# Patient Record
Sex: Male | Born: 1955 | Race: Black or African American | Hispanic: No | Marital: Married | Smoking: Former smoker
Health system: Southern US, Community
[De-identification: ages and names within clinical notes are randomized; demographics above are authoritative.]

## PROBLEM LIST (undated history)

## (undated) DIAGNOSIS — R0600 Dyspnea, unspecified: Secondary | ICD-10-CM

## (undated) DIAGNOSIS — I2510 Atherosclerotic heart disease of native coronary artery without angina pectoris: Secondary | ICD-10-CM

## (undated) DIAGNOSIS — I10 Essential (primary) hypertension: Secondary | ICD-10-CM

## (undated) DIAGNOSIS — E119 Type 2 diabetes mellitus without complications: Secondary | ICD-10-CM

## (undated) DIAGNOSIS — I639 Cerebral infarction, unspecified: Secondary | ICD-10-CM

## (undated) HISTORY — PX: FRACTURE SURGERY: SHX138

## (undated) HISTORY — PX: TIBIA FRACTURE SURGERY: SHX806

---

## 1996-12-04 HISTORY — PX: CORONARY ANGIOPLASTY WITH STENT PLACEMENT: SHX49

## 2000-07-04 ENCOUNTER — Encounter: Payer: Self-pay | Admitting: Emergency Medicine

## 2000-07-04 ENCOUNTER — Inpatient Hospital Stay (HOSPITAL_COMMUNITY): Admission: EM | Admit: 2000-07-04 | Discharge: 2000-07-08 | Payer: Self-pay | Admitting: Cardiology

## 2001-12-27 ENCOUNTER — Emergency Department (HOSPITAL_COMMUNITY): Admission: EM | Admit: 2001-12-27 | Discharge: 2001-12-27 | Payer: Self-pay | Admitting: Emergency Medicine

## 2006-01-19 ENCOUNTER — Ambulatory Visit: Payer: Self-pay | Admitting: Internal Medicine

## 2007-03-11 ENCOUNTER — Ambulatory Visit: Payer: Self-pay | Admitting: Internal Medicine

## 2007-07-15 ENCOUNTER — Ambulatory Visit: Payer: Self-pay | Admitting: Internal Medicine

## 2007-07-15 LAB — CONVERTED CEMR LAB
BUN: 20 mg/dL (ref 6–23)
CO2: 25 meq/L (ref 19–32)
Calcium: 8.5 mg/dL (ref 8.4–10.5)
Chloride: 105 meq/L (ref 96–112)
Creatinine, Ser: 1.3 mg/dL (ref 0.4–1.5)
GFR calc Af Amer: 75 mL/min
GFR calc non Af Amer: 62 mL/min
Glucose, Bld: 132 mg/dL — ABNORMAL HIGH (ref 70–99)
Potassium: 3.9 meq/L (ref 3.5–5.1)
Pro B Natriuretic peptide (BNP): 15 pg/mL (ref 0.0–100.0)
Sodium: 136 meq/L (ref 135–145)

## 2008-04-30 ENCOUNTER — Ambulatory Visit: Payer: Self-pay | Admitting: Internal Medicine

## 2008-06-22 ENCOUNTER — Ambulatory Visit: Payer: Self-pay | Admitting: Internal Medicine

## 2008-06-22 LAB — CONVERTED CEMR LAB
ALT: 20 units/L (ref 0–53)
AST: 19 units/L (ref 0–37)
Albumin: 3.4 g/dL — ABNORMAL LOW (ref 3.5–5.2)
Alkaline Phosphatase: 51 units/L (ref 39–117)
Bilirubin, Direct: 0.1 mg/dL (ref 0.0–0.3)
Cholesterol: 205 mg/dL (ref 0–200)
Direct LDL: 135.3 mg/dL
HDL: 37.9 mg/dL — ABNORMAL LOW (ref >39.0)
Total Bilirubin: 0.9 mg/dL (ref 0.3–1.2)
Total CHOL/HDL Ratio: 5.4
Total Protein: 7.1 g/dL (ref 6.0–8.3)
Triglycerides: 199 mg/dL — ABNORMAL HIGH (ref 0–149)
VLDL: 40 mg/dL (ref 0–40)

## 2009-04-20 DIAGNOSIS — Z9189 Other specified personal risk factors, not elsewhere classified: Secondary | ICD-10-CM | POA: Insufficient documentation

## 2009-04-20 DIAGNOSIS — I447 Left bundle-branch block, unspecified: Secondary | ICD-10-CM

## 2009-04-20 DIAGNOSIS — I509 Heart failure, unspecified: Secondary | ICD-10-CM | POA: Insufficient documentation

## 2009-04-20 DIAGNOSIS — E1159 Type 2 diabetes mellitus with other circulatory complications: Secondary | ICD-10-CM | POA: Insufficient documentation

## 2009-04-20 DIAGNOSIS — I255 Ischemic cardiomyopathy: Secondary | ICD-10-CM | POA: Insufficient documentation

## 2009-04-20 DIAGNOSIS — Z87891 Personal history of nicotine dependence: Secondary | ICD-10-CM | POA: Insufficient documentation

## 2009-04-20 DIAGNOSIS — I10 Essential (primary) hypertension: Secondary | ICD-10-CM | POA: Insufficient documentation

## 2009-04-20 DIAGNOSIS — I5022 Chronic systolic (congestive) heart failure: Secondary | ICD-10-CM | POA: Insufficient documentation

## 2009-04-22 ENCOUNTER — Ambulatory Visit: Payer: Self-pay | Admitting: Internal Medicine

## 2010-04-28 ENCOUNTER — Ambulatory Visit: Payer: Self-pay | Admitting: Internal Medicine

## 2010-05-13 ENCOUNTER — Telehealth: Payer: Self-pay | Admitting: Internal Medicine

## 2011-01-03 NOTE — Progress Notes (Signed)
Summary: refill  Phone Note Refill Request Message from:  Patient on May 13, 2010 10:19 AM  Refills Requested: Medication #1:  METOPROLOL TARTRATE 50 MG TABS Take1 and 1/2  tablet by mouth twice a day/out  Medication #2:  ENALAPRIL MALEATE 5 MG TABS Take one tablet by mouth twice a day  Medication #3:  SIMVASTATIN 20 MG TABS Take 1 tablet by mouth at bedtime/out. Erick Alley DR   Initial call taken by: Judie Grieve,  May 13, 2010 10:20 AM    Prescriptions: SIMVASTATIN 20 MG TABS (SIMVASTATIN) Take 1 tablet by mouth at bedtime/out  #30 x 5   Entered by:   Laurance Flatten CMA   Authorized by:   Laren Boom, MD, [facility]   Signed by:   Laurance Flatten CMA on 05/13/2010   Method used:   Electronically to        [facility] (retail)       [address]       [place], [place]  [address]       Ph: [phone]       Fax: [phone]   RxID:   [id number] ENALAPRIL MALEATE 5 MG TABS (ENALAPRIL MALEATE) Take one tablet by mouth twice a day  #60 x 5   Entered by:   Laurance Flatten CMA   Authorized by:   Laren Boom, MD, [facility]   Signed by:   Laurance Flatten CMA on 05/13/2010   Method used:   Electronically to        [facility] (retail)       [address]       [place], [place]  [address]       Ph: [phone]       Fax: [phone]   RxID:   [id number] METOPROLOL TARTRATE 50 MG TABS (METOPROLOL TARTRATE) Take1 and 1/2  tablet by mouth twice a day/out  #90 x 5   Entered by:   Laurance Flatten CMA   Authorized by:   Laren Boom, MD, [facility]   Signed by:   Laurance Flatten CMA on 05/13/2010   Method used:   Electronically to        [facility] (retail)       [address]       [place], [place]  [address]       Ph: [phone]       Fax: [phone]   RxID:   [id number]

## 2011-01-03 NOTE — Assessment & Plan Note (Signed)
Summary: per check out/sf   Visit Type:  Follow-up   History of Present Illness: Mr. Haidar returns today for followup.  He is a 55 yo man with a h/o DCM, class 2 CHF, and HTN.  He has a h/o but denies current Cocaine abuse.  No other symptoms today.  He is almost out of his meds.  Current Medications (verified): 1)  Aspirin Ec 325 Mg Tbec (Aspirin) .... Take One Tablet By Mouth Daily 2)  Metoprolol Tartrate 50 Mg Tabs (Metoprolol Tartrate) .... Take1 and 1/2  Tablet By Mouth Twice A Day/out 3)  Enalapril Maleate 5 Mg Tabs (Enalapril Maleate) .... Take One Tablet By Mouth Twice A Day 4)  Nitroglycerin 0.4 Mg/hr Pt24 (Nitroglycerin) .... Apply 1 Patch Each Morning and Remove At Bedtime 5)  Simvastatin 20 Mg Tabs (Simvastatin) .... Take 1 Tablet By Mouth At Bedtime/out  Allergies (verified): No Known Drug Allergies  Past History:  Past Medical History: Last updated: 04/20/2009 TOBACCO ABUSE, HX OF (ICD-V15.82) COCAINE ABUSE, HX OF (ICD-V15.9) LEFT BUNDLE BRANCH BLOCK (ICD-426.3) HYPERTENSION (ICD-401.9) CHF (ICD-428.0) CARDIOMYOPATHY, ISCHEMIC (ICD-414.8)  Review of Systems  The patient denies chest pain, syncope, dyspnea on exertion, and peripheral edema.    Vital Signs:  Patient profile:   55 year old male Height:      74 inches Weight:      253 pounds BMI:     32.60 Pulse rate:   82 / minute BP sitting:   106 / 70  (left arm)  Vitals Entered By: Laurance Flatten CMA (Apr 28, 2010 11:26 AM)  Physical Exam  General:  Well developed, well nourished, in no acute distress. Head:  normocephalic and atraumatic Eyes:  PERRLA/EOM intact; conjunctiva and lids normal. Mouth:  Teeth, gums and palate normal. Oral mucosa normal. Neck:  Neck supple, no JVD. No masses, thyromegaly or abnormal cervical nodes. Lungs:  Clear bilaterally to auscultation with no wheezes, rales, or rhonchi. Heart:  RRR with normal S1 and S2.  PMI is enlarged and laterally displaced. Abdomen:  Bowel sounds  positive; abdomen soft and non-tender without masses, organomegaly, or hernias noted. No hepatosplenomegaly. Msk:  Back normal, normal gait. Muscle strength and tone normal. Pulses:  pulses normal in all 4 extremities Extremities:  No clubbing or cyanosis. Neurologic:  Alert and oriented x 3.   Impression & Recommendations:  Problem # 1:  CHF (ICD-428.0) His chronic systolic CHF is well controlled class 2.  He appears to be taking his meds and following his diet.  He has not been hospitalized since I saw him last.   His updated medication list for this problem includes:    Aspirin Ec 325 Mg Tbec (Aspirin) .Marland Kitchen... Take one tablet by mouth daily    Metoprolol Tartrate 50 Mg Tabs (Metoprolol tartrate) .Marland Kitchen... Take1 and 1/2  tablet by mouth twice a day/out    Enalapril Maleate 5 Mg Tabs (Enalapril maleate) .Marland Kitchen... Take one tablet by mouth twice a day    Nitroglycerin 0.4 Mg/hr Pt24 (Nitroglycerin) .Marland Kitchen... Apply 1 patch each morning and remove at bedtime  Problem # 2:  HYPERTENSION (ICD-401.9) He will continue a low sodium diet. His updated medication list for this problem includes:    Aspirin Ec 325 Mg Tbec (Aspirin) .Marland Kitchen... Take one tablet by mouth daily    Metoprolol Tartrate 50 Mg Tabs (Metoprolol tartrate) .Marland Kitchen... Take1 and 1/2  tablet by mouth twice a day/out    Enalapril Maleate 5 Mg Tabs (Enalapril maleate) .Marland Kitchen... Take one tablet by  mouth twice a day  Problem # 3:  TOBACCO ABUSE, HX OF (ICD-V15.82) I have encouraged him to stop smoking.

## 2011-04-18 NOTE — Assessment & Plan Note (Signed)
[facility]                         ELECTROPHYSIOLOGY OFFICE NOTE   LAURA, RADILLA                          MRN:          [id number]  DATE:04/30/2008                            DOB:          08-28-1956    Karl [name] presents today for followup.  He is a very pleasant middle-  aged male with long-standing ischemic cardiomyopathy, class I-II  congestive heart failure.  He had a stent.  He returns today for  followup.   Patient has been stable.  He has had no syncope.  Denies chest pain or  shortness of breath.  He continues to work.   MEDICATIONS:  1. Aspirin 325 a day.  2. Metoprolol 75 twice daily.  3. Enalapril 5 twice daily.   PHYSICAL EXAMINATION:  He is a pleasant, well-appearing middle-aged man  in no distress.  Blood pressure 112/80, pulse 61 and regular, respirations 18.  Weight  was 252 pounds.  NECK:  No jugular venous distention.  LUNGS:  Clear bilaterally to auscultation.  No wheezes, rales or rhonchi  are present.  No increased work of breathing was present.  CARDIOVASCULAR:  Regular rate and rhythm with a normal S1 and S2.  There  are no murmurs.  There is a soft S3 gallop present.  The PMI was  enlarged and laterally displaced.  ABDOMEN:  Soft and nontender.  There is no organomegaly.  EXTREMITIES:  No clubbing, cyanosis or edema.  Pulses were 2+ and  symmetric.   EKG demonstrates sinus rhythm with left bundle branch block.   IMPRESSION:  1. Ischemic cardiomyopathy.  2. Congestive heart failure, class I-II.  3. Left bundle branch block.   DISCUSSION:  Overall, Karl [name] is stable.  I have again recommended  with proceeding with ICD insertion, and he again declines.  I have  explained to him the risks of not proceeding with defibrillator implant,  and he is willing to take these risks.  I will plan to see the patient  back in a year or sooner should he have worsening symptoms.     Doylene Canning. Ladona Ridgel, MD  Electronically  Signed    GWT/MedQ  DD: 04/30/2008  DT: 04/30/2008  Job #: [id number]

## 2011-04-21 NOTE — Cardiovascular Report (Signed)
[place]  Patient:    Karl [name]                           MRN: [id number] Proc. Date: 07/04/00 Adm. Date:  [id number] Attending:  Ronaldo Miyamoto CC:         Smitty Cords. Beverely Pace, M.D.  Arturo Morton. Riley Kill, M.D. [facility]  [facility]   Cardiac Catheterization  INDICATIONS:  The patient is a 55 year old with no prior history of coronary artery disease.  He developed chest pain at approximately 3:15 a.m. and subsequently came to the [facility] Emergency Room where EKG was compatible with an anterior wall MI.  Dr. Smitty Cords. Cheek contacted me, we contacted the cardiac catheterization laboratory and I came to see the patient.  The patient does give a history of having used cocaine about a month ago and his urine screen for cocaine was positive.  Importantly, he denies any recent use of cocaine, although he has been around with people smoking it in the last few days.  He denies any other exposures.  He has no prior history of major cardiac problems and has no allergies and takes no medicines at the current time.  He was given aspirin and heparin in the [facility] Emergency Room as well as nitroglycerin with slight relief of pain, but pain continued and he was brought to the catheterization laboratory emergently.  PROCEDURE 1. Left heart catheterization. 2. Selective coronary arteriography. 3. Selective left ventriculography. 4. Percutaneous transluminal coronary angioplasty and stenting of the left    anterior descending artery.  DESCRIPTION OF PROCEDURE:  The patient was brought to the cath lab and prepped and draped in usual fashion.  Xylocaine 1% was used for local anesthesia. Through an anterior puncture, the right femoral artery was easily entered and a 6-French sheath was placed.  Views of the left and right coronary arteries were obtained in multiple angiographic projections.  Ventriculography was performed in the RAO  projection.  Following a pressure pullback, the pigtail catheter was removed.  The patients ACT was satisfactory.  An Integrilin drip was started as a double bolus with a drip.  A JL4 guiding catheter was utilized to intubate the left main.  The lesion was crossed with a 0.014 Hi-Torque Floppy wire with a fairly sharp bend to gain access to the LAD from the left main.  The lesion was crossed.  There was evidence of extensive thrombus and the lesion was initially opened using a 2.5-mm Quantum Ranger balloon.  We then upgraded to a 3.5-mm Maverick, and then deployed a 3.5-mm 25-mm length NIR Elite stent.  Following this, there was a small area distally that was yet uncovered and this was covered with an 9-mm AVE 4.0 stent.  The previously placed stent was then post-dilated using a 4-mm balloon, with marked improvement in the appearance of the artery.  TIMI-3 flow was established.  EKG stabilized with less ventricular ectopy.  He had a small amount of reperfusion rhythm but overall, this improved.  All catheters were then subsequently removed and the femoral sheath sewn into place.  He was taken to the holding area in satisfactory clinical condition.  HEMODYNAMIC DATA:  The central aortic pressure was 133/91.  LV pressure 127/33.  There was no gradient on pullback across the aortic valve.  The left main coronary artery was long and free of critical disease.  The left anterior descending artery was totally  occluded in its midportion. Leading into this area of total occlusion, there was about 30% segmental plaquing.  There was mild calcification.  Just beyond the septal perforator, there was essentially total occlusion and then a trailing thrombus distal to the diagonal.  Following the PTCA and stenting, there was reestablishment of TIMI-3 flow in a widely patent artery.  The distal vessel had minimal luminal irregularity but wrapped the apex and was free of critical disease.  There was a  ramus intermedius that had what appeared to be about an 80-90% mid lesion and some thrombus more proximally with haziness and filling defect.  The circumflex proper provided two marginal branches and terminated as a posterolateral and posterior descending branch.  There was perhaps 20% narrowing of the proximal first marginal branch.  The remainder of the vessel was free of critical disease, although there were minor luminal irregularities.  The right coronary artery was a small non-dominant vessel without critical stenosis.  Ventriculography in the RAO projection reveals anteroapical and distal inferior akinesis.  There is an ejection fraction of 31%.  There is mitral regurgitation of a mild to at most moderate degree.  CONCLUSIONS 1. Large anterior wall infarction, treated with primary percutaneous    transluminal coronary angioplasty and stenting of the left anterior    descending artery. 2. Other findings as noted above. 3. Ejection fraction 31%.  DISPOSITION:  The patient will be treated with aspirin and Plavix.  He will be started on an ACE inhibitor.  Generally, we would start him on a beta blocker, but with recent cocaine and positive urine test, we will hold for about two to three days at least before starting this therapy.  I have discussed the situation with his sister and the patient gave me permission to discuss his entire situation with his sister from [place], who is his closest relative. Pictures were shown to her. DD:  07/04/00 TD:  07/05/00 Job: [id number]

## 2011-04-21 NOTE — Assessment & Plan Note (Signed)
[facility]                         ELECTROPHYSIOLOGY OFFICE NOTE   Karl [name], Karl [name]                          MRN:          [id number]  DATE:03/11/2007                            DOB:          03/16/1956    Karl [name] returns today for followup.  He is a very pleasant middle-aged  male with a history of ischemic cardiomyopathy, congestive heart failure  and severe LV dysfunction.  He does have dyslipidemia and hypertension.  The patient has been stable in the last year.  He has had no syncope.  Otherwise, he has been stable.   PHYSICAL EXAMINATION:  He is a pleasant, well-appearing middle-aged man  in no acute distress.  The blood pressure today was 128/78, the pulse 65 and regular and the  respirations were 18.  Weight was 256 pounds.  NECK:  No jugular venous distention.  LUNGS:  Clear bilaterally to auscultation with no wheeze, rales or  rhonchi.  CARDIOVASCULAR:  Regular rate and rhythm with a normal S1 and S2.  There  is a soft S4 gallop present.  The PMI was enlarged and laterally  displaced.  EXTREMITIES:  No cyanosis, clubbing or edema.   MEDICATIONS:  Aspirin, metoprolol and enalapril.   IMPRESSION:  1. Ischemic cardiomyopathy.  2. Congestive heart failure.   DISCUSSION:  Overall, Karl [name] is stable.  Again, I have recommended  ICD implantation, but again, he refuses, citing concerns about financial  issues, but ultimately he still refuses ICD.  He will continue his  present medical therapy and we will plan to see him back in a year.     Doylene Canning. Ladona Ridgel, MD  Electronically Signed    GWT/MedQ  DD: 03/11/2007  DT: 03/12/2007  Job #: [id number]

## 2011-04-21 NOTE — Discharge Summary (Signed)
[place]  Patient:    Karl [name], Karl [name]                          MRN: [id number] Adm. Date:  [id number] Disc. Date: 07/08/00 Attending:  Ronaldo Miyamoto Dictator:   Joellyn Rued, P.A.C. CC:         [facility]                           Discharge Summary  DATE OF BIRTH: Apr 18, 1956  HISTORY OF PRESENT ILLNESS: Karl [name] is a 55 year old black male who developed chest discomfort at 3:15 a.m.  His EKG was suggestive of anterolateral myocardial infarction associated with continuing chest discomfort.  It was also noted he uses cocaine, last use approximately one month ago; however, he has been exposed to cocaine smoking.  He has a history of tobacco use, family history of hypertension and diabetes.  LABORATORY DATA: Initial CK was elevated at 6903 with MB of 543.0, relative index 7.9.  Subsequent enzymes and troponins were declining.  Hemoglobin 14.2, hematocrit 41.6, normal indices; platelets 339,000; WBC 12.2.  Sodium 133, potassium 3.5, BUN 19, creatinine 1.2 on the day of discharge.  HOSPITAL COURSE: Karl [name] initially presented to [facility] and was emergently transferred to [facility].  He went to the catheterization laboratory with Dr. Riley Kill and catheterization, according to the progress notes, showed a 20% mid circumflex branch, 20% proximal LAD, 100% proximal LAD associated with clot, with 90% first diagonal, anterior apical akinesis with ejection fraction of 32%.  Angioplasty stenting was performed to the LAD by Dr. Riley Kill.  Over the next several days he was ambulating without difficulty and was seen by cardiac rehabilitation.  Further enzymes and EKGs showed evolving myocardial infarction.  By July 08, 2000 it was felt that he was ready to be discharged home.  DISCHARGE DIAGNOSES:  1. Acute anterior myocardial infarction, status post emergent angioplasty and     stenting to the left anterior  descending coronary artery.  2. Ischemic cardiomyopathy.  3. Cocaine and tobacco use.  DISPOSITION: He is discharged home.  DISCHARGE MEDICATIONS:  1. Enteric-coated aspirin 325 mg q.d.  2. Plavix 75 mg q.d. for four weeks.  3. Altace 2.5 mg b.i.d.  4. Lopressor 50 mg 1/2 tablet b.i.d.  5. Sublingual nitroglycerin as-needed.  DISCHARGE ACTIVITY: He was advised on no lifting, driving, sexual activity, or heavy exertion until seen by physician.  DISCHARGE DIET: Maintain low-fat/low-salt/low-cholesterol diet.  DISCHARGE INSTRUCTIONS: If he has any problems with the catheterization site he is asked to call immediately.  He was advised on no smoking of tobacco products or cocaine, or other drug usage, and advised less than two ounces of alcohol per day.  FOLLOW-UP: He was asked to call our office on Monday to arrange a follow-up appointment.  He will also need a fasting lipid check as an outpatient.DD: 07/08/00 TD:  07/08/00 Job: [id number]

## 2011-06-08 ENCOUNTER — Other Ambulatory Visit: Payer: Self-pay | Admitting: Internal Medicine

## 2011-08-04 ENCOUNTER — Telehealth: Payer: Self-pay | Admitting: Internal Medicine

## 2011-08-04 ENCOUNTER — Other Ambulatory Visit: Payer: Self-pay | Admitting: Internal Medicine

## 2011-08-04 MED ORDER — ENALAPRIL MALEATE 5 MG PO TABS: 5.0000 mg | ORAL_TABLET | Freq: Two times a day (BID) | ORAL | Status: DC

## 2011-08-04 NOTE — Telephone Encounter (Signed)
Pt needs a refill on enalapril 5mg  bid called into [facility] on [address] and he needs it today because he is completely out

## 2011-12-23 ENCOUNTER — Other Ambulatory Visit: Payer: Self-pay | Admitting: Internal Medicine

## 2011-12-25 ENCOUNTER — Other Ambulatory Visit: Payer: Self-pay

## 2011-12-25 ENCOUNTER — Other Ambulatory Visit: Payer: Self-pay | Admitting: Internal Medicine

## 2011-12-25 MED ORDER — ENALAPRIL MALEATE 5 MG PO TABS: 5.0000 mg | ORAL_TABLET | Freq: Two times a day (BID) | ORAL | Status: DC

## 2011-12-25 MED ORDER — METOPROLOL TARTRATE 50 MG PO TABS: 50.0000 mg | ORAL_TABLET | Freq: Two times a day (BID) | ORAL | Status: DC

## 2011-12-26 ENCOUNTER — Other Ambulatory Visit: Payer: Self-pay | Admitting: *Deleted

## 2011-12-26 MED ORDER — METOPROLOL TARTRATE 50 MG PO TABS: 75.0000 mg | ORAL_TABLET | Freq: Two times a day (BID) | ORAL | Status: DC

## 2012-04-12 ENCOUNTER — Other Ambulatory Visit: Payer: Self-pay | Admitting: Internal Medicine

## 2012-04-12 NOTE — Telephone Encounter (Signed)
Pt is out of pills 

## 2012-05-15 ENCOUNTER — Other Ambulatory Visit: Payer: Self-pay | Admitting: Internal Medicine

## 2012-05-15 ENCOUNTER — Telehealth: Payer: Self-pay | Admitting: Internal Medicine

## 2012-05-15 MED ORDER — METOPROLOL TARTRATE 50 MG PO TABS: 75.0000 mg | ORAL_TABLET | Freq: Two times a day (BID) | ORAL | Status: DC

## 2012-05-15 MED ORDER — ENALAPRIL MALEATE 5 MG PO TABS: 5.0000 mg | ORAL_TABLET | Freq: Two times a day (BID) | ORAL | Status: DC

## 2012-05-15 NOTE — Telephone Encounter (Signed)
New msg Pt's daughter wanted to talk to you about his meds.

## 2012-05-15 NOTE — Telephone Encounter (Signed)
F/u   Patient daughter call back for status update, she can be reached at [phone].

## 2012-05-15 NOTE — Telephone Encounter (Signed)
Called daughter back and left message for her.( Not sure it went through)

## 2012-05-15 NOTE — Telephone Encounter (Signed)
Patient daughter [name] returning patient call, she can be reached at [phone].

## 2012-05-15 NOTE — Telephone Encounter (Signed)
Close  

## 2012-05-16 ENCOUNTER — Telehealth: Payer: Self-pay | Admitting: Internal Medicine

## 2012-05-16 NOTE — Telephone Encounter (Signed)
Let daughter know we will have him scheduled for appointment next avaliable

## 2012-05-16 NOTE — Telephone Encounter (Signed)
[name] [phone] -patient daughter said she never recvd call from nurse yesterday.  Please return call to daughter [name]

## 2012-06-13 ENCOUNTER — Ambulatory Visit: Payer: Self-pay | Admitting: Internal Medicine

## 2012-06-27 ENCOUNTER — Encounter: Payer: Self-pay | Admitting: Internal Medicine

## 2012-07-23 ENCOUNTER — Ambulatory Visit: Payer: Self-pay | Admitting: Internal Medicine

## 2012-07-24 ENCOUNTER — Other Ambulatory Visit: Payer: Self-pay | Admitting: Cardiology

## 2012-07-24 MED ORDER — METOPROLOL TARTRATE 50 MG PO TABS: 75.0000 mg | ORAL_TABLET | Freq: Two times a day (BID) | ORAL | Status: DC

## 2012-07-29 ENCOUNTER — Encounter: Payer: Self-pay | Admitting: Internal Medicine

## 2012-07-29 ENCOUNTER — Telehealth: Payer: Self-pay | Admitting: Internal Medicine

## 2012-07-29 ENCOUNTER — Ambulatory Visit (INDEPENDENT_AMBULATORY_CARE_PROVIDER_SITE_OTHER): Payer: PRIVATE HEALTH INSURANCE | Admitting: Internal Medicine

## 2012-07-29 VITALS — BP 150/90 | HR 63 | Ht 74.0 in | Wt 256.1 lb

## 2012-07-29 DIAGNOSIS — I5022 Chronic systolic (congestive) heart failure: Secondary | ICD-10-CM

## 2012-07-29 DIAGNOSIS — I509 Heart failure, unspecified: Secondary | ICD-10-CM

## 2012-07-29 DIAGNOSIS — I2589 Other forms of chronic ischemic heart disease: Secondary | ICD-10-CM

## 2012-07-29 MED ORDER — CARVEDILOL 12.5 MG PO TABS: 12.5000 mg | ORAL_TABLET | Freq: Two times a day (BID) | ORAL | Status: DC

## 2012-07-29 NOTE — Telephone Encounter (Signed)
New Problem:    Patient's wife call in because the patient missed his appointment from this morning and wanted to be worked in later today. Please call back.

## 2012-07-29 NOTE — Patient Instructions (Addendum)
Your physician wants you to follow-up in: 1 year with Dr Court Joy will receive a reminder letter in the mail two months in advance. If you don't receive a letter, please call our office to schedule the follow-up appointment. Your physician has recommended you make the following change in your medication: STOP Metoprolol and START Carvedilol 12.5 mg twice daily      2 Gram Low Sodium Diet A 2 gram sodium diet restricts the amount of sodium in the diet to no more than 2 g or 2000 mg daily. Limiting the amount of sodium is often used to help lower blood pressure. It is important if you have heart, liver, or kidney problems. Many foods contain sodium for flavor and sometimes as a preservative. When the amount of sodium in a diet needs to be low, it is important to know what to look for when choosing foods and drinks. The following includes some information and guidelines to help make it easier for you to adapt to a low sodium diet. QUICK TIPS  Do not add salt to food.   Avoid convenience items and fast food.   Choose unsalted snack foods.   Buy lower sodium products, often labeled as "lower sodium" or "no salt added."   Check food labels to learn how much sodium is in 1 serving.   When eating at a restaurant, ask that your food be prepared with less salt or none, if possible.  READING FOOD LABELS FOR SODIUM INFORMATION The nutrition facts label is a good place to find how much sodium is in foods. Look for products with no more than 500 to 600 mg of sodium per meal and no more than 150 mg per serving. Remember that 2 g = 2000 mg. The food label may also list foods as:  Sodium-free: Less than 5 mg in a serving.   Very low sodium: 35 mg or less in a serving.   Low-sodium: 140 mg or less in a serving.   Light in sodium: 50% less sodium in a serving. For example, if a food that usually has 300 mg of sodium is changed to become light in sodium, it will have 150 mg of sodium.   Reduced  sodium: 25% less sodium in a serving. For example, if a food that usually has 400 mg of sodium is changed to reduced sodium, it will have 300 mg of sodium.  CHOOSING FOODS Grains  Avoid: Salted crackers and snack items. Some cereals, including instant hot cereals. Bread stuffing and biscuit mixes. Seasoned rice or pasta mixes.   Choose: Unsalted snack items. Low-sodium cereals, oats, puffed wheat and rice, shredded wheat. English muffins and bread. Pasta.  Meats  Avoid: Salted, canned, smoked, spiced, pickled meats, including fish and poultry. Bacon, ham, sausage, cold cuts, hot dogs, anchovies.   Choose: Low-sodium canned tuna and salmon. Fresh or frozen meat, poultry, and fish.  Dairy  Avoid: Processed cheese and spreads. Cottage cheese. Buttermilk and condensed milk. Regular cheese.   Choose: Milk. Low-sodium cottage cheese. Yogurt. Sour cream. Low-sodium cheese.  Fruits and Vegetables  Avoid: Regular canned vegetables. Regular canned tomato sauce and paste. Frozen vegetables in sauces. Olives. Rosita Fire. Relishes. Sauerkraut.   Choose: Low-sodium canned vegetables. Low-sodium tomato sauce and paste. Frozen or fresh vegetables. Fresh and frozen fruit.  Condiments  Avoid: Canned and packaged gravies. Worcestershire sauce. Tartar sauce. Barbecue sauce. Soy sauce. Steak sauce. Ketchup. Onion, garlic, and table salt. Meat flavorings and tenderizers.   Choose: Fresh and dried  herbs and spices. Low-sodium varieties of mustard and ketchup. Lemon juice. Tabasco sauce. Horseradish.  SAMPLE 2 GRAM SODIUM MEAL PLAN Breakfast / Sodium (mg)  1 cup low-fat milk / 143 mg   2 slices whole-wheat toast / 270 mg   1 tbs heart-healthy margarine / 153 mg   1 hard-boiled egg / 139 mg   1 small orange / 0 mg  Lunch / Sodium (mg)  1 cup raw carrots / 76 mg    cup hummus / 298 mg   1 cup low-fat milk / 143 mg    cup red grapes / 2 mg   1 whole-wheat pita bread / 356 mg  Dinner / Sodium  (mg)  1 cup whole-wheat pasta / 2 mg   1 cup low-sodium tomato sauce / 73 mg   3 oz lean ground beef / 57 mg   1 small side salad (1 cup raw spinach leaves,  cup cucumber,  cup yellow bell pepper) with 1 tsp olive oil and 1 tsp red wine vinegar / 25 mg  Snack / Sodium (mg)  1 container low-fat vanilla yogurt / 107 mg   3 graham cracker squares / 127 mg  Nutrient Analysis  Calories: 2033   Protein: 77 g   Carbohydrate: 282 g   Fat: 72 g   Sodium: 1971 mg  Document Released: 11/20/2005 Document Revised: 11/09/2011 Document Reviewed: 02/21/2010 [facility] Patient Information 2012 [place], [place].

## 2012-07-29 NOTE — Telephone Encounter (Signed)
Per Dr Ladona Ridgel Karl [name] can come in now and be seen. --- Karl [name] understands and is on his way.

## 2012-07-30 ENCOUNTER — Encounter: Payer: Self-pay | Admitting: Internal Medicine

## 2012-07-30 NOTE — Assessment & Plan Note (Signed)
He has no anginal symptoms. He will continue his current meds.

## 2012-07-30 NOTE — Assessment & Plan Note (Signed)
His chronic systolic heart failure is class 2. I have again discussed the importance of reducing his sodium intake, not missing his medications, and losing weight. With his metoprolol prescription empty, we will start carvedilol with uptitration.  He remains uninterested in undergoing ICD implantation.

## 2012-07-30 NOTE — Progress Notes (Signed)
HPI Karl [name] returns today for CHF followup. He is a pleasant 56 yo man with a h/o chronic systolic heart failure, HTN, and remote substance abuse. In the interim he notes that his blood pressure has been elevated and still has ongoing non-compliance. He admits to consuming sodium in excess.  Allergies  Allergen Reactions  . Lipitor (Atorvastatin)     N & V     Current Outpatient Prescriptions  Medication Sig Dispense Refill  . enalapril (VASOTEC) 5 MG tablet TAKE ONE TABLET BY MOUTH TWICE DAILY  60 tablet  2  . carvedilol (COREG) 12.5 MG tablet Take 1 tablet (12.5 mg total) by mouth 2 (two) times daily.  180 tablet  3     No past medical history on file.  ROS:   All systems reviewed and negative except as noted in the HPI.   No past surgical history on file.   No family history on file.   History   Social History  . Marital Status: Married    Spouse Name: N/A    Number of Children: N/A  . Years of Education: N/A   Occupational History  . Not on file.   Social History Main Topics  . Smoking status: Former Smoker -- 2.0 packs/day for 5 years    Types: Cigarettes    Quit date: 12/04/2000  . Smokeless tobacco: Not on file  . Alcohol Use: Not on file  . Drug Use: Not on file  . Sexually Active: Not on file   Other Topics Concern  . Not on file   Social History Narrative  . No narrative on file     BP 150/90  Pulse 63  Ht 6\' 2"  (1.88 m)  Wt 256 lb 1.9 oz (116.175 kg)  BMI 32.88 kg/m2  Physical Exam:  Well appearing middle aged man, NAD HEENT: Unremarkable Neck:  No JVD, no thyromegally Lungs:  Clear with no wheezes, rales, or rhonchi HEART:  Regular rate rhythm, no murmurs, no rubs, no clicks Abd:  soft, positive bowel sounds, no organomegally, no rebound, no guarding Ext:  2 plus pulses, no edema, no cyanosis, no clubbing Skin:  No rashes no nodules Neuro:  CN II through XII intact, motor grossly intact  EKG NSR with LBBB  Assess/Plan:

## 2012-09-18 ENCOUNTER — Other Ambulatory Visit: Payer: Self-pay | Admitting: Internal Medicine

## 2013-01-28 ENCOUNTER — Other Ambulatory Visit: Payer: Self-pay

## 2013-01-28 MED ORDER — ENALAPRIL MALEATE 5 MG PO TABS: 5.0000 mg | ORAL_TABLET | Freq: Two times a day (BID) | ORAL | Status: DC

## 2013-07-03 ENCOUNTER — Emergency Department (HOSPITAL_COMMUNITY): Payer: BC Managed Care – PPO

## 2013-07-03 ENCOUNTER — Emergency Department (HOSPITAL_COMMUNITY)
Admission: EM | Admit: 2013-07-03 | Discharge: 2013-07-03 | Disposition: A | Discharge status: Home / Self Care | Payer: BC Managed Care – PPO | Attending: Emergency Medicine | Admitting: Emergency Medicine

## 2013-07-03 ENCOUNTER — Encounter (HOSPITAL_COMMUNITY): Payer: Self-pay | Admitting: Emergency Medicine

## 2013-07-03 DIAGNOSIS — R5383 Other fatigue: Secondary | ICD-10-CM | POA: Insufficient documentation

## 2013-07-03 DIAGNOSIS — R5381 Other malaise: Secondary | ICD-10-CM | POA: Insufficient documentation

## 2013-07-03 DIAGNOSIS — R112 Nausea with vomiting, unspecified: Secondary | ICD-10-CM

## 2013-07-03 DIAGNOSIS — I2510 Atherosclerotic heart disease of native coronary artery without angina pectoris: Secondary | ICD-10-CM | POA: Insufficient documentation

## 2013-07-03 DIAGNOSIS — Z87891 Personal history of nicotine dependence: Secondary | ICD-10-CM | POA: Insufficient documentation

## 2013-07-03 DIAGNOSIS — Z7982 Long term (current) use of aspirin: Secondary | ICD-10-CM | POA: Insufficient documentation

## 2013-07-03 DIAGNOSIS — R197 Diarrhea, unspecified: Secondary | ICD-10-CM | POA: Insufficient documentation

## 2013-07-03 DIAGNOSIS — R011 Cardiac murmur, unspecified: Secondary | ICD-10-CM | POA: Insufficient documentation

## 2013-07-03 DIAGNOSIS — R109 Unspecified abdominal pain: Secondary | ICD-10-CM | POA: Insufficient documentation

## 2013-07-03 HISTORY — DX: Atherosclerotic heart disease of native coronary artery without angina pectoris: I25.10

## 2013-07-03 LAB — COMPREHENSIVE METABOLIC PANEL
AST: 18 U/L (ref 0–37)
CO2: 21 mEq/L (ref 19–32)
Calcium: 9.6 mg/dL (ref 8.4–10.5)
Creatinine, Ser: 0.98 mg/dL (ref 0.50–1.35)
GFR calc Af Amer: >90 mL/min (ref >90)
GFR calc non Af Amer: 89 mL/min — ABNORMAL LOW (ref >90)
Total Protein: 7.9 g/dL (ref 6.0–8.3)

## 2013-07-03 LAB — LIPASE, BLOOD: Lipase: 17 U/L (ref 11–59)

## 2013-07-03 LAB — URINALYSIS W MICROSCOPIC + REFLEX CULTURE
Leukocytes, UA: NEGATIVE
Nitrite: NEGATIVE
Specific Gravity, Urine: 1.027 (ref 1.005–1.030)
pH: 8 (ref 5.0–8.0)

## 2013-07-03 LAB — CBC
HCT: 45.5 % (ref 39.0–52.0)
Hemoglobin: 15.3 g/dL (ref 13.0–17.0)
MCH: 25.8 pg — ABNORMAL LOW (ref 26.0–34.0)
RBC: 5.92 MIL/uL — ABNORMAL HIGH (ref 4.22–5.81)

## 2013-07-03 MED ORDER — MORPHINE SULFATE 4 MG/ML IJ SOLN
4.0000 mg | Freq: Once | INTRAMUSCULAR | Status: AC
  Administered 2013-07-03: 4 mg via INTRAVENOUS
  Filled 2013-07-03: qty 1

## 2013-07-03 MED ORDER — ONDANSETRON 8 MG PO TBDP: 8.0000 mg | ORAL_TABLET | Freq: Three times a day (TID) | ORAL | Status: DC | PRN

## 2013-07-03 MED ORDER — ONDANSETRON HCL 4 MG/2ML IJ SOLN
4.0000 mg | Freq: Once | INTRAMUSCULAR | Status: AC
  Administered 2013-07-03: 4 mg via INTRAVENOUS
  Filled 2013-07-03: qty 2

## 2013-07-03 MED ORDER — SODIUM CHLORIDE 0.9 % IV BOLUS (SEPSIS)
1000.0000 mL | Freq: Once | INTRAVENOUS | Status: AC
  Administered 2013-07-03: 1000 mL via INTRAVENOUS

## 2013-07-03 NOTE — ED Notes (Signed)
C/o one day history of vomiting with diaphoresis.  Times 6 today.  Ate "nabs and sunchips" to settle stomach this am. States emesis x 1 since arrival at ED. Describes as clear with "some red in it". Intermittent shooting,burning pain throughout abdomen.

## 2013-07-03 NOTE — ED Notes (Signed)
Patient transported to X-ray 

## 2013-07-03 NOTE — ED Provider Notes (Signed)
CSN: [id number]     Arrival date & time 07/03/13  1707 History     First MD Initiated Contact with Patient 07/03/13 1755     Chief Complaint  Patient presents with  . Emesis  . Diarrhea   (Consider location/radiation/quality/duration/timing/severity/associated sxs/prior Treatment) HPI Karl [name] is a 57 y.o. male who presents to ED with complaint of nausea, vomiting. States woke up this morning feeling unwell and nauseated. Pt states ate some "nabs and chips" and went to work. Few hours late, states started having nausea, vomiting. States vomited about 10 times. States it is mostly food contents. States diffuse abdominal "burning." States "its not pain, its burning."  Pt states no diarrhea. No hx of abdominal problems, no hx of surgeries, denies daily alcohol, frequent NSAIDs use. No numbness or weakness in extremities. Did not try any medications. Denies chest pain or SOB  Past Medical History  Diagnosis Date  . Coronary artery disease    Past Surgical History  Procedure Laterality Date  . Coronary stent placement  19 years ago   History reviewed. No pertinent family history. History  Substance Use Topics  . Smoking status: Former Smoker -- 2.00 packs/day for 5 years    Types: Cigarettes    Quit date: 12/04/2000  . Smokeless tobacco: Not on file  . Alcohol Use: Yes     Comment: weekends    Review of Systems  Constitutional: Negative for fever and chills.  Respiratory: Negative.   Cardiovascular: Negative.   Gastrointestinal: Positive for nausea, vomiting and abdominal pain. Negative for diarrhea, constipation and blood in stool.  Genitourinary: Negative for dysuria and flank pain.  Neurological: Positive for weakness. Negative for headaches.  All other systems reviewed and are negative.    Allergies  Lipitor  Home Medications   Current Outpatient Rx  Name  Route  Sig  Dispense  Refill  . acetaminophen (TYLENOL) 500 MG tablet   Oral   Take 500 mg by mouth  every 6 (six) hours as needed for pain.         Marland Kitchen aspirin 325 MG tablet   Oral   Take 325 mg by mouth daily.         . carvedilol (COREG) 12.5 MG tablet   Oral   Take 1 tablet (12.5 mg total) by mouth 2 (two) times daily.   180 tablet   3   . enalapril (VASOTEC) 5 MG tablet      TAKE ONE TABLET BY MOUTH TWICE DAILY. NEEDS APPOINTMENT BEFORE ADDITIONAL REFILLS   60 tablet   2    BP 152/80  Pulse 71  Temp(Src) 98 F (36.7 C) (Oral)  Ht 6' 2.5" (1.892 m)  Wt 245 lb (111.131 kg)  BMI 31.05 kg/m2  SpO2 100% Physical Exam  Nursing note and vitals reviewed. Constitutional: He appears well-developed and well-nourished. No distress.  Eyes: Conjunctivae are normal.  Neck: Neck supple.  Cardiovascular: Normal rate and regular rhythm.   Murmur heard. Pulmonary/Chest: Effort normal and breath sounds normal. No respiratory distress. He has no wheezes. He has no rales.  Abdominal: Soft. Bowel sounds are normal. He exhibits no distension. There is no tenderness. There is no rebound.  Musculoskeletal: He exhibits no edema.  Neurological: He is alert.  Skin: Skin is warm and dry.    ED Course   Procedures (including critical care time)  Pt with nausea, vomiting. Abdominal burning. Labs ordered. Abdomen soft, doubt acute abdomen.   Results for orders placed during  the hospital encounter of 07/03/13  COMPREHENSIVE METABOLIC PANEL      Result Value Range   Sodium 135  135 - 145 mEq/L   Potassium 4.0  3.5 - 5.1 mEq/L   Chloride 101  96 - 112 mEq/L   CO2 21  19 - 32 mEq/L   Glucose, Bld 155 (*) 70 - 99 mg/dL   BUN 17  6 - 23 mg/dL   Creatinine, Ser 4.09  0.50 - 1.35 mg/dL   Calcium 9.6  8.4 - 81.1 mg/dL   Total Protein 7.9  6.0 - 8.3 g/dL   Albumin 3.8  3.5 - 5.2 g/dL   AST 18  0 - 37 U/L   ALT 14  0 - 53 U/L   Alkaline Phosphatase 63  39 - 117 U/L   Total Bilirubin 0.4  0.3 - 1.2 mg/dL   GFR calc non Af Amer 89 (*) >90 mL/min   GFR calc Af Amer >90  >90 mL/min  CBC       Result Value Range   WBC 8.0  4.0 - 10.5 K/uL   RBC 5.92 (*) 4.22 - 5.81 MIL/uL   Hemoglobin 15.3  13.0 - 17.0 g/dL   HCT 91.4  78.2 - 95.6 %   MCV 76.9 (*) 78.0 - 100.0 fL   MCH 25.8 (*) 26.0 - 34.0 pg   MCHC 33.6  30.0 - 36.0 g/dL   RDW 21.3  08.6 - 57.8 %   Platelets 284  150 - 400 K/uL  LIPASE, BLOOD      Result Value Range   Lipase 17  11 - 59 U/L  URINALYSIS W MICROSCOPIC + REFLEX CULTURE      Result Value Range   Color, Urine YELLOW  YELLOW   APPearance TURBID (*) CLEAR   Specific Gravity, Urine 1.027  1.005 - 1.030   pH 8.0  5.0 - 8.0   Glucose, UA NEGATIVE  NEGATIVE mg/dL   Hgb urine dipstick NEGATIVE  NEGATIVE   Bilirubin Urine NEGATIVE  NEGATIVE   Ketones, ur 15 (*) NEGATIVE mg/dL   Protein, ur 30 (*) NEGATIVE mg/dL   Urobilinogen, UA 1.0  0.0 - 1.0 mg/dL   Nitrite NEGATIVE  NEGATIVE   Leukocytes, UA NEGATIVE  NEGATIVE   Urine-Other AMORPHOUS URATES/PHOSPHATES     Dg Abd Acute W/chest  07/03/2013   *RADIOLOGY REPORT*  Clinical Data: Nausea, vomiting  ACUTE ABDOMEN SERIES (ABDOMEN 2 VIEW & CHEST 1 VIEW)  Comparison: None.  Findings: Borderline cardiac enlargement with vascular and interstitial prominence.  No definite CHF or pneumonia.  Negative for effusion or pneumothorax.  Trachea midline.  Monitor leads overlie the chest.  No free air.  Scattered air and stool throughout the bowel.  Negative for obstruction or ileus.  Degenerative changes of the spine with a mild scoliosis.  IMPRESSION: No acute finding by plain radiography.   Original Report Authenticated By: Judie Petit. Miles Costain, M.D.    Date: 07/04/2013  Rate: 74  Rhythm: normal sinus rhythm  QRS Axis: left  Intervals: normal  ST/T Wave abnormalities: normal  Conduction Disutrbances:left bundle branch block  Narrative Interpretation:   Old EKG Reviewed: unchanged    No results found. 1. Nausea & vomiting     MDM  Pt with nausea, vomiting, abdominal pain. Pt rehydrated with IVF. Given zofran for nausea. Pt had  no more vomiting in ED. Tolerating PO fluids. VS normal other then htn 150s/80s. Acute abdominal series negative. Labs unremarkable. Abdomen reassessed. Soft, non  tender. Pt feels well and stable for d/c home. Suspect most likely gastroenteritis. Home with zofran and follow up as needed.   Filed Vitals:   07/03/13 1741 07/03/13 2242 07/03/13 2244  BP: 152/80 156/90   Pulse: 71  85  Temp: 98 F (36.7 C)    TempSrc: Oral    Resp:  15   Height: 6' 2.5" (1.892 m)    Weight: 245 lb (111.131 kg)    SpO2: 100%  97%     Lottie Mussel, PA-C 07/04/13 0047

## 2013-07-03 NOTE — ED Notes (Addendum)
Pt aware that we need urine.  Sts he can not provide a sample at this time.  Urinal given.

## 2013-07-04 NOTE — ED Provider Notes (Signed)
Medical screening examination/treatment/procedure(s) were conducted as a shared visit with non-physician practitioner(s) or resident  and myself.  I personally evaluated the patient during the encounter and agree with the findings and plan unless otherwise indicated. Pt improved significantly in ED.  FLuids and nausea meds.  Likely gastritis vs GE.   No abd pain on recheck.  Discussed strict reasons to return . DC  Labs Reviewed  COMPREHENSIVE METABOLIC PANEL - Abnormal; Notable for the following:    Glucose, Bld 155 (*)    GFR calc non Af Amer 89 (*)    All other components within normal limits  CBC - Abnormal; Notable for the following:    RBC 5.92 (*)    MCV 76.9 (*)    MCH 25.8 (*)    All other components within normal limits  URINALYSIS W MICROSCOPIC + REFLEX CULTURE - Abnormal; Notable for the following:    APPearance TURBID (*)    Ketones, ur 15 (*)    Protein, ur 30 (*)    All other components within normal limits  LIPASE, BLOOD     Enid Skeens, MD 07/04/13 0100

## 2013-09-26 ENCOUNTER — Other Ambulatory Visit: Payer: Self-pay

## 2013-09-26 DIAGNOSIS — I509 Heart failure, unspecified: Secondary | ICD-10-CM

## 2013-09-26 MED ORDER — ENALAPRIL MALEATE 5 MG PO TABS: 5.0000 mg | ORAL_TABLET | Freq: Two times a day (BID) | ORAL | Status: DC

## 2013-09-26 MED ORDER — CARVEDILOL 12.5 MG PO TABS: 12.5000 mg | ORAL_TABLET | Freq: Two times a day (BID) | ORAL | Status: DC

## 2013-10-28 ENCOUNTER — Ambulatory Visit (INDEPENDENT_AMBULATORY_CARE_PROVIDER_SITE_OTHER): Payer: BC Managed Care – PPO | Admitting: Internal Medicine

## 2013-10-28 ENCOUNTER — Encounter (INDEPENDENT_AMBULATORY_CARE_PROVIDER_SITE_OTHER): Payer: Self-pay

## 2013-10-28 ENCOUNTER — Encounter: Payer: Self-pay | Admitting: Internal Medicine

## 2013-10-28 VITALS — BP 121/82 | HR 82 | Ht 74.0 in | Wt 250.0 lb

## 2013-10-28 DIAGNOSIS — I2589 Other forms of chronic ischemic heart disease: Secondary | ICD-10-CM

## 2013-10-28 DIAGNOSIS — I509 Heart failure, unspecified: Secondary | ICD-10-CM

## 2013-10-28 DIAGNOSIS — I10 Essential (primary) hypertension: Secondary | ICD-10-CM

## 2013-10-28 MED ORDER — ENALAPRIL MALEATE 5 MG PO TABS: 5.0000 mg | ORAL_TABLET | Freq: Two times a day (BID) | ORAL | Status: DC

## 2013-10-28 MED ORDER — CARVEDILOL 12.5 MG PO TABS: 12.5000 mg | ORAL_TABLET | Freq: Two times a day (BID) | ORAL | Status: DC

## 2013-10-28 NOTE — Progress Notes (Signed)
HPI Karl [name] returns today for CHF followup. He is a pleasant 58 yo man with a h/o chronic systolic heart failure, HTN, and remote substance abuse. When I saw the patient several months ago, we decided to start carvedilol, and I strongly encouraged the patient to reduce his salt intake. Since then he has been stable. His blood pressure is much improved. He denies chest pain, shortness of breath, or syncope.  Allergies  Allergen Reactions  . Lipitor [Atorvastatin]     N & V     Current Outpatient Prescriptions  Medication Sig Dispense Refill  . acetaminophen (TYLENOL) 500 MG tablet Take 500 mg by mouth every 6 (six) hours as needed for pain.      Marland Kitchen aspirin 325 MG tablet Take 325 mg by mouth daily.      . carvedilol (COREG) 12.5 MG tablet Take 1 tablet (12.5 mg total) by mouth 2 (two) times daily.  120 tablet  0  . enalapril (VASOTEC) 5 MG tablet Take 1 tablet (5 mg total) by mouth 2 (two) times daily.  60 tablet  1   No current facility-administered medications for this visit.     Past Medical History  Diagnosis Date  . Coronary artery disease     ROS:   All systems reviewed and negative except as noted in the HPI.   Past Surgical History  Procedure Laterality Date  . Coronary stent placement  19 years ago     No family history on file.   History   Social History  . Marital Status: Married    Spouse Name: N/A    Number of Children: N/A  . Years of Education: N/A   Occupational History  . Not on file.   Social History Main Topics  . Smoking status: Former Smoker -- 2.00 packs/day for 5 years    Types: Cigarettes    Quit date: 12/04/2000  . Smokeless tobacco: Not on file  . Alcohol Use: Yes     Comment: weekends  . Drug Use: Not on file  . Sexual Activity: Not on file   Other Topics Concern  . Not on file   Social History Narrative  . No narrative on file     BP 121/82  Pulse 82  Ht 6\' 2"  (1.88 m)  Wt 250 lb (113.399 kg)  BMI 32.08  kg/m2  Physical Exam:  Well appearing middle aged man, NAD HEENT: Unremarkable Neck:  No JVD, no thyromegally Lungs:  Clear with no wheezes, rales, or rhonchi HEART:  Regular rate rhythm, no murmurs, no rubs, no clicks,soft S4 gallop Abd:  soft, positive bowel sounds, no organomegally, no rebound, no guarding Ext:  2 plus pulses, no edema, no cyanosis, no clubbing Skin:  No rashes no nodules Neuro:  CN II through XII intact, motor grossly intact  Assess/Plan:

## 2013-10-28 NOTE — Assessment & Plan Note (Signed)
His blood pressure is improved. He'll continue his current medical therapy, and asked the patient to maintain a low-sodium diet.

## 2013-10-28 NOTE — Patient Instructions (Signed)
Your physician wants you to follow-up in: 12 months with Dr. Taylor. You will receive a reminder letter in the mail two months in advance. If you don't receive a letter, please call our office to schedule the follow-up appointment.    

## 2013-10-28 NOTE — Assessment & Plan Note (Signed)
His heart failure symptoms remain well compensated and he has no angina. He will continue his current medical therapy. We again discussed the possibility of inserting a prophylactic ICD. Once again he has refused.

## 2014-02-02 ENCOUNTER — Telehealth: Payer: Self-pay | Admitting: Internal Medicine

## 2014-02-02 NOTE — Telephone Encounter (Signed)
Called and spoke with daughter.  She was very non-specific in regards to his pain and what is going on.  He has an appointment tomorrow

## 2014-02-02 NOTE — Telephone Encounter (Signed)
New Message  Pt daughter called. She says the pt has complained of a "burning on his side" No further specifications. Requesting a call back to discuss the symptom.Marland Kitchen Requesting an appt// offered pt to speak with nurse first to determine severity

## 2014-02-03 ENCOUNTER — Encounter: Payer: Self-pay | Admitting: *Deleted

## 2014-02-03 ENCOUNTER — Ambulatory Visit (INDEPENDENT_AMBULATORY_CARE_PROVIDER_SITE_OTHER): Payer: BC Managed Care – PPO | Admitting: Physician Assistant

## 2014-02-03 ENCOUNTER — Encounter: Payer: Self-pay | Admitting: Physician Assistant

## 2014-02-03 VITALS — BP 129/89 | HR 64 | Ht 74.0 in | Wt 256.0 lb

## 2014-02-03 DIAGNOSIS — I2589 Other forms of chronic ischemic heart disease: Secondary | ICD-10-CM

## 2014-02-03 DIAGNOSIS — I2510 Atherosclerotic heart disease of native coronary artery without angina pectoris: Secondary | ICD-10-CM

## 2014-02-03 DIAGNOSIS — I5022 Chronic systolic (congestive) heart failure: Secondary | ICD-10-CM

## 2014-02-03 DIAGNOSIS — I255 Ischemic cardiomyopathy: Secondary | ICD-10-CM

## 2014-02-03 DIAGNOSIS — I10 Essential (primary) hypertension: Secondary | ICD-10-CM

## 2014-02-03 DIAGNOSIS — R079 Chest pain, unspecified: Secondary | ICD-10-CM

## 2014-02-03 NOTE — Patient Instructions (Signed)
TRY IBUPROFEN 400 MG EVERY 8 HOURS FOR 5 DAYS ONLY  ALTERNATE WITH HEAT AND ICE TO THE AFFECTED AREA FOR THE NEXT 7 DAYS; IF NOT GETTING BETTER THEN YOU HAVE BEEN ADVISED TO FOLLOW UP WITH YOUR PRIMARY CARE PHYSICIAN.  YOU HAVE BEEN GIVEN A WORK NOTE TODAY  You have been referred to [facility] PRIMARY CARE

## 2014-02-03 NOTE — Progress Notes (Signed)
[address] [place], [place]  [address] Phone: [phone] Fax:  [phone]  Date:  02/03/2014   ID:  Karl [name], DOB [birth year], MRN [id number]  PCP:  None  Cardiologist:  Dr. Lewayne [name]     History of Present Illness: Karl [name] is a 58 y.o. male with a hx of Ant STEMI 07/2000 s/p BMS to the LAD, ICM, systolic CHF, LBBB, HTN, HL, substance abuse (cocaine).  He has refused ICD in the past.  Last seen by Dr. Ladona [name] 10/2013.  LHC (07/2000):  mLAD occluded (PCI), mRI 80-90%, pOM1 20%, EF 31%.  PCI: BMS x 2 to LAD.  He presents today with complaints of right sided rib pain. This has been ongoing for the last one to 2 weeks. He points to an area in his lower right chest in the mid axillary line. Pain occurs with certain movements. He denies exertional chest pain. He denies significant dyspnea. He denies panic, PND or edema. He denies syncope. He denies radicular symptoms. He denies any injury. He does have repetitive motion at his job.  Recent Labs: 07/03/2013: ALT 14; Creatinine 0.98; Hemoglobin 15.3; Potassium 4.0   Wt Readings from Last 3 Encounters:  10/28/13 250 lb (113.399 kg)  07/03/13 245 lb (111.131 kg)  07/29/12 256 lb 1.9 oz (116.175 kg)     Past Medical History  Diagnosis Date  . Coronary artery disease     Current Outpatient Prescriptions  Medication Sig Dispense Refill  . acetaminophen (TYLENOL) 500 MG tablet Take 500 mg by mouth every 6 (six) hours as needed for pain.      Marland Kitchen aspirin 325 MG tablet Take 325 mg by mouth daily.      . carvedilol (COREG) 12.5 MG tablet Take 1 tablet (12.5 mg total) by mouth 2 (two) times daily.  180 tablet  3  . enalapril (VASOTEC) 5 MG tablet Take 1 tablet (5 mg total) by mouth 2 (two) times daily.  180 tablet  3   No current facility-administered medications for this visit.    Allergies:   Lipitor   Social History:  The patient  reports that he quit smoking about 13 years ago. His smoking use included  Cigarettes. He has a 10 pack-year smoking history. He does not have any smokeless tobacco history on file. He reports that he drinks alcohol. He denies drug abuse.  Family History:  The patient's family history is not on file.   ROS:  Please see the history of present illness.      All other systems reviewed and negative.   PHYSICAL EXAM: VS:  BP 129/89  Pulse 64  Ht 6\' 2"  (1.88 m)  Wt 256 lb (116.121 kg)  BMI 32.85 kg/m2 Well nourished, well developed, in no acute distress HEENT: normal Neck: no JVD Cardiac:  normal S1, S2; RRR; no murmur Chest: No tenderness to palpation of the right chest Lungs:  clear to auscultation bilaterally, no wheezing, rhonchi or rales Abd: soft, nontender, no hepatomegaly Ext: no edema Skin: warm and dry MSK: Bilateral upper extremity strength normal and equal. Neuro:  CNs 2-12 intact, no focal abnormalities noted; biceps and brachioradialis DTRs 2+ bilaterally  EKG:  NSR, HR 64, left axis deviation, poor R wave progression, no significant change when compared to the prior tracings     ASSESSMENT AND PLAN:  1. R Chest Pain:  This is a musculoskeletal problem.  He likely has an intercostal muscle strain.  I have asked  him to take Ibuprofen 400 mg TID for 5 days, then stop.  He can alternate heat and ice.  He can further follow up with primary care.  2. CAD:  No angina.  Continue ASA, beta blocker.  He is intol to statins. 3. Ischemic Cardiomyopathy:  Continue ACEI, beta blocker Rx.  He has refused ICD implantation in the past.  4. Chronic Systolic CHF:  Volume stable off of diuretics. He is NYHA Class II.  5. Hypertension:  Controlled.  6. Disposition:  F/u with Dr. Lewayne BuntingGregg [name] as planned.  Refer to primary care.   Signed, Tereso NewcomerScott Nene Aranas, PA-C  02/03/2014 2:37 PM

## 2014-02-17 ENCOUNTER — Ambulatory Visit (INDEPENDENT_AMBULATORY_CARE_PROVIDER_SITE_OTHER): Payer: BC Managed Care – PPO | Admitting: Family Medicine

## 2014-02-17 ENCOUNTER — Encounter: Payer: Self-pay | Admitting: Family Medicine

## 2014-02-17 VITALS — BP 130/82 | HR 75 | Wt 250.0 lb

## 2014-02-17 DIAGNOSIS — J209 Acute bronchitis, unspecified: Secondary | ICD-10-CM

## 2014-02-17 MED ORDER — CETIRIZINE HCL 10 MG PO TABS: 10.0000 mg | ORAL_TABLET | Freq: Every day | ORAL | Status: DC

## 2014-02-17 MED ORDER — FLUTICASONE PROPIONATE 50 MCG/ACT NA SUSP: 2.0000 | Freq: Every day | NASAL | Status: DC

## 2014-02-17 MED ORDER — AZITHROMYCIN 500 MG PO TABS: 500.0000 mg | ORAL_TABLET | Freq: Every day | ORAL | Status: DC

## 2014-02-17 NOTE — Patient Instructions (Addendum)
Nice to meet you Flonase daily for next month at least Consider zyrtec over the counter Teaspoon of honey before bedtime can help with the cough azithro 500mg  daily for 3 days Come back if not perfect in 2 weeks.   Bronchitis Bronchitis is inflammation of the airways that extend from the windpipe into the lungs (bronchi). The inflammation often causes mucus to develop, which leads to a cough. If the inflammation becomes severe, it may cause shortness of breath. CAUSES  Bronchitis may be caused by:   Viral infections.   Bacteria.   Cigarette smoke.   Allergens, pollutants, and other irritants.  SIGNS AND SYMPTOMS  The most common symptom of bronchitis is a frequent cough that produces mucus. Other symptoms include:  Fever.   Body aches.   Chest congestion.   Chills.   Shortness of breath.   Sore throat.  DIAGNOSIS  Bronchitis is usually diagnosed through a medical history and physical exam. Tests, such as chest X-rays, are sometimes done to rule out other conditions.  TREATMENT  You may need to avoid contact with whatever caused the problem (smoking, for example). Medicines are sometimes needed. These may include:  Antibiotics. These may be prescribed if the condition is caused by bacteria.  Cough suppressants. These may be prescribed for relief of cough symptoms.   Inhaled medicines. These may be prescribed to help open your airways and make it easier for you to breathe.   Steroid medicines. These may be prescribed for those with recurrent (chronic) bronchitis. HOME CARE INSTRUCTIONS  Get plenty of rest.   Drink enough fluids to keep your urine clear or pale yellow (unless you have a medical condition that requires fluid restriction). Increasing fluids may help thin your secretions and will prevent dehydration.   Only take over-the-counter or prescription medicines as directed by your health care provider.  Only take antibiotics as directed. Make  sure you finish them even if you start to feel better.  Avoid secondhand smoke, irritating chemicals, and strong fumes. These will make bronchitis worse. If you are a smoker, quit smoking. Consider using nicotine gum or skin patches to help control withdrawal symptoms. Quitting smoking will help your lungs heal faster.   Put a cool-mist humidifier in your bedroom at night to moisten the air. This may help loosen mucus. Change the water in the humidifier daily. You can also run the hot water in your shower and sit in the bathroom with the door closed for 5 10 minutes.   Follow up with your health care provider as directed.   Wash your hands frequently to avoid catching bronchitis again or spreading an infection to others.  SEEK MEDICAL CARE IF: Your symptoms do not improve after 1 week of treatment.  SEEK IMMEDIATE MEDICAL CARE IF:  Your fever increases.  You have chills.   You have chest pain.   You have worsening shortness of breath.   You have bloody sputum.  You faint.  You have lightheadedness.  You have a severe headache.   You vomit repeatedly. MAKE SURE YOU:   Understand these instructions.  Will watch your condition.  Will get help right away if you are not doing well or get worse. Document Released: 11/20/2005 Document Revised: 09/10/2013 Document Reviewed: 07/15/2013 [facility] Patient Information 2014 [place], [place].  Allergic Rhinitis Allergic rhinitis is when the mucous membranes in the nose respond to allergens. Allergens are particles in the air that cause your body to have an allergic reaction. This causes you to  release allergic antibodies. Through a chain of events, these eventually cause you to release histamine into the blood stream. Although meant to protect the body, it is this release of histamine that causes your discomfort, such as frequent sneezing, congestion, and an itchy, runny nose.  CAUSES  Seasonal allergic rhinitis (hay fever) is  caused by pollen allergens that may come from grasses, trees, and weeds. Year-round allergic rhinitis (perennial allergic rhinitis) is caused by allergens such as house dust mites, pet dander, and mold spores.  SYMPTOMS   Nasal stuffiness (congestion).  Itchy, runny nose with sneezing and tearing of the eyes. DIAGNOSIS  Your health care provider can help you determine the allergen or allergens that trigger your symptoms. If you and your health care provider are unable to determine the allergen, skin or blood testing may be used. TREATMENT  Allergic Rhinitis does not have a cure, but it can be controlled by:  Medicines and allergy shots (immunotherapy).  Avoiding the allergen. Hay fever may often be treated with antihistamines in pill or nasal spray forms. Antihistamines block the effects of histamine. There are over-the-counter medicines that may help with nasal congestion and swelling around the eyes. Check with your health care provider before taking or giving this medicine.  If avoiding the allergen or the medicine prescribed do not work, there are many new medicines your health care provider can prescribe. Stronger medicine may be used if initial measures are ineffective. Desensitizing injections can be used if medicine and avoidance does not work. Desensitization is when a patient is given ongoing shots until the body becomes less sensitive to the allergen. Make sure you follow up with your health care provider if problems continue. HOME CARE INSTRUCTIONS It is not possible to completely avoid allergens, but you can reduce your symptoms by taking steps to limit your exposure to them. It helps to know exactly what you are allergic to so that you can avoid your specific triggers. SEEK MEDICAL CARE IF:   You have a fever.  You develop a cough that does not stop easily (persistent).  You have shortness of breath.  You start wheezing.  Symptoms interfere with normal daily  activities. Document Released: 08/15/2001 Document Revised: 09/10/2013 Document Reviewed: 07/28/2013 [facility] Patient Information 2014 [place]

## 2014-02-17 NOTE — Progress Notes (Signed)
SUBJECTIVE:  Karl [name] is a 58 y.o. male who complains of congestion, sore throat, post nasal drip, productive cough and hoarseness for 21 days. He denies a history of dizziness, fatigue, myalgias, nausea, shortness of breath, vomiting, weakness and weight loss and denies a history of asthma. Patient has smoke cigarettes. Patient did see cardiologist in this is not cardiac-related.   OBJECTIVE: Blood pressure 130/82, pulse 75, weight 250 lb (113.399 kg), SpO2 95.00%.  He appears well, vital signs are as noted. Ears normal.  Throat and pharynx normal.  Neck supple. No adenopathy in the neck. Nose is congested. Sinuses non tender. The chest is clear, without wheezes or rales.  ASSESSMENT:  allergic rhinitis and bronchitis  PLAN: Symptomatic therapy suggested: push fluids, rest and return office visit prn if symptoms persist or worsen. Azithromycin given secondary to duration. Short course secondary to patient's cardiovascular risk patient was given other medications as per orders. . Call or return to clinic prn if these symptoms worsen or fail to improve as anticipated. Differential also includes a possible fosinopril allergy but we may reconsider change in medication if he continues. In addition to this we may reconsider gastroesophageal reflex disease and treating him with a PPI. Patient does followup I would encourage an x-ray of the chest as well.

## 2014-07-09 ENCOUNTER — Other Ambulatory Visit (INDEPENDENT_AMBULATORY_CARE_PROVIDER_SITE_OTHER): Payer: BC Managed Care – PPO

## 2014-07-09 ENCOUNTER — Encounter: Payer: Self-pay | Admitting: Internal Medicine

## 2014-07-09 ENCOUNTER — Ambulatory Visit (INDEPENDENT_AMBULATORY_CARE_PROVIDER_SITE_OTHER): Payer: BC Managed Care – PPO | Admitting: Internal Medicine

## 2014-07-09 VITALS — BP 132/82 | HR 81 | Temp 97.8°F | Wt 228.5 lb

## 2014-07-09 DIAGNOSIS — R81 Glycosuria: Secondary | ICD-10-CM

## 2014-07-09 DIAGNOSIS — R350 Frequency of micturition: Secondary | ICD-10-CM

## 2014-07-09 DIAGNOSIS — R3589 Other polyuria: Secondary | ICD-10-CM

## 2014-07-09 DIAGNOSIS — R3989 Other symptoms and signs involving the genitourinary system: Secondary | ICD-10-CM

## 2014-07-09 DIAGNOSIS — R358 Other polyuria: Secondary | ICD-10-CM

## 2014-07-09 DIAGNOSIS — B372 Candidiasis of skin and nail: Secondary | ICD-10-CM

## 2014-07-09 DIAGNOSIS — R82998 Other abnormal findings in urine: Secondary | ICD-10-CM

## 2014-07-09 DIAGNOSIS — R8290 Unspecified abnormal findings in urine: Secondary | ICD-10-CM

## 2014-07-09 DIAGNOSIS — R9389 Abnormal findings on diagnostic imaging of other specified body structures: Secondary | ICD-10-CM

## 2014-07-09 DIAGNOSIS — Z833 Family history of diabetes mellitus: Secondary | ICD-10-CM

## 2014-07-09 DIAGNOSIS — R631 Polydipsia: Secondary | ICD-10-CM

## 2014-07-09 LAB — BASIC METABOLIC PANEL
BUN: 15 mg/dL (ref 6–23)
CHLORIDE: 94 meq/L — AB (ref 96–112)
CO2: 25 meq/L (ref 19–32)
CREATININE: 1.1 mg/dL (ref 0.4–1.5)
Calcium: 9.8 mg/dL (ref 8.4–10.5)
GFR: 84.33 mL/min (ref >60.00)
Glucose, Bld: 596 mg/dL (ref 70–99)
Potassium: 4.7 mEq/L (ref 3.5–5.1)
SODIUM: 128 meq/L — AB (ref 135–145)

## 2014-07-09 LAB — HEMOGLOBIN A1C: HEMOGLOBIN A1C: 13.2 % — AB (ref 4.6–6.5)

## 2014-07-09 LAB — POCT URINALYSIS DIPSTICK
Leukocytes, UA: NEGATIVE
Nitrite, UA: NEGATIVE
PH UA: 5
Protein, UA: NEGATIVE
RBC UA: NEGATIVE
SPEC GRAV UA: 1.01
Urobilinogen, UA: 0.2

## 2014-07-09 LAB — PSA: PSA: 2.79 ng/mL (ref 0.10–4.00)

## 2014-07-09 MED ORDER — GLIMEPIRIDE 2 MG PO TABS: 2.0000 mg | ORAL_TABLET | Freq: Every day | ORAL | Status: DC

## 2014-07-09 MED ORDER — METFORMIN HCL 500 MG PO TABS: 500.0000 mg | ORAL_TABLET | Freq: Two times a day (BID) | ORAL | Status: DC

## 2014-07-09 MED ORDER — KETOCONAZOLE 2 % EX CREA: 1.0000 "application " | TOPICAL_CREAM | Freq: Every day | CUTANEOUS | Status: DC

## 2014-07-09 NOTE — Progress Notes (Signed)
   Subjective:    Patient ID: Karl [name], male    DOB: 07/04/1956, 58 y.o.   MRN: [id number]  HPI    In the last 2 weeks he describes profound urinary urgency and nocturia. This is associated with profound mouth dryness. He states from 8 AM-5 PM he urinates 10 times per day. He urinates every hour during the night.  He also describes polydipsia.  During this period time he states he's lost almost 40 pounds. He also has had some rash of  the foreskin; he was never circumcised.  He has numbness of the index fingers.  Family history is positive for Diabetes in his mother, brother, & nephew.  Review of Systems  He has no trouble starting urinary stream or finishing his stream.  He also denies dysuria, hematuria, pyuria  He has no flank pain  He has no fever, chills, or sweats.       Objective:   Physical Exam  Positive or pertinent findings:  Head shaven. He has a IT consultant. Severe caries are present; multiple missing teeth He has an S4 with slight slurring. Feet are flat. There is some drying of the feet without significant dermatitis There is a candidal dermatitis of the foreskin. The left prostate is enlarged with suggestion of possible nodularity  General appearance :adequately nourished; in no distress. Eyes: No conjunctival inflammation or scleral icterus is present. Oral exam:  Lips and gums are healthy appearing.There is no oropharyngeal erythema or exudate noted.  Heart:  Normal rate and regular rhythm. S1 and S2 normal without gallop, murmur, click,or  rub . Lungs:Chest clear to auscultation; no wheezes, rhonchi,rales ,or rubs present.No increased work of breathing.  Abdomen: bowel sounds normal, soft and non-tender without masses, organomegaly or hernias noted.  No guarding or rebound. No flank tenderness to percussion. Skin:Warm & dry.  Intact without suspicious lesions or rashes ; no jaundice or tenting Lymphatic: No lymphadenopathy is noted about the head,  neck, axilla, or inguinal areas.            Assessment & Plan:  #1 polyuria, polydipsia, weight loss with family history diabetes. Urine does reveal glucosuria.  #2 abnormal prostate  See orders and recommendations. He's been asked to hold diabetic medications until we obtain his pending labs.

## 2014-07-09 NOTE — Patient Instructions (Addendum)
Caries ( cavities) and plaque on the teeth can lead to significant local and systemic infections with threat to your health.Please have dental care completed as soon as possible.  Your next office appointment will be determined based upon review of your pending labs. Those instructions will be transmitted to you through My Chart  OR  by mail;whichever process is your choice to receive results & recommendations .   Please do not fill the prescriptions until we verify that  diabetes is present and its severity.

## 2014-07-09 NOTE — Progress Notes (Signed)
Pre visit review using our clinic review tool, if applicable. No additional management support is needed unless otherwise documented below in the visit note. 

## 2014-07-10 ENCOUNTER — Encounter: Payer: Self-pay | Admitting: Internal Medicine

## 2014-07-10 ENCOUNTER — Other Ambulatory Visit: Payer: Self-pay | Admitting: Internal Medicine

## 2014-07-10 DIAGNOSIS — E119 Type 2 diabetes mellitus without complications: Secondary | ICD-10-CM | POA: Insufficient documentation

## 2014-07-10 DIAGNOSIS — E1165 Type 2 diabetes mellitus with hyperglycemia: Secondary | ICD-10-CM

## 2014-07-10 DIAGNOSIS — E1159 Type 2 diabetes mellitus with other circulatory complications: Secondary | ICD-10-CM | POA: Insufficient documentation

## 2014-07-10 DIAGNOSIS — E1151 Type 2 diabetes mellitus with diabetic peripheral angiopathy without gangrene: Secondary | ICD-10-CM | POA: Insufficient documentation

## 2014-07-10 LAB — URINE CULTURE: Colony Count: 6000

## 2014-07-24 ENCOUNTER — Other Ambulatory Visit: Payer: Self-pay | Admitting: Internal Medicine

## 2014-08-05 ENCOUNTER — Encounter: Payer: Self-pay | Admitting: Internal Medicine

## 2014-08-05 ENCOUNTER — Other Ambulatory Visit: Payer: Self-pay | Admitting: *Deleted

## 2014-08-05 ENCOUNTER — Ambulatory Visit (INDEPENDENT_AMBULATORY_CARE_PROVIDER_SITE_OTHER): Payer: BC Managed Care – PPO | Admitting: Internal Medicine

## 2014-08-05 VITALS — BP 124/74 | HR 67 | Temp 97.9°F | Resp 12 | Ht 74.0 in | Wt 228.0 lb

## 2014-08-05 DIAGNOSIS — E1159 Type 2 diabetes mellitus with other circulatory complications: Secondary | ICD-10-CM | POA: Diagnosis not present

## 2014-08-05 MED ORDER — ONETOUCH LANCETS MISC: Status: DC

## 2014-08-05 MED ORDER — METFORMIN HCL 1000 MG PO TABS: 1000.0000 mg | ORAL_TABLET | Freq: Two times a day (BID) | ORAL | Status: DC

## 2014-08-05 MED ORDER — GLUCOSE BLOOD VI STRP: ORAL_STRIP | Status: DC

## 2014-08-05 NOTE — Progress Notes (Signed)
Patient ID: Karl [name], male   DOB: Apr 11, 1956, 58 y.o.   MRN: [id number]  HPI: TREVEL [name] is a 58 y.o.-year-old male, referred by Dr. Lewayne Bunting, for management of DM2, non-insulin-dependent, uncontrolled, without complications. Does not have a PCP yet.   Patient has been diagnosed with diabetes this mo. He had increased thirst and urination and also lost 30 lbs in ~2 weeks.  Last hemoglobin A1c was: Lab Results  Component Value Date   HGBA1C 13.2* 07/09/2014   Pt is on a regimen of - last 2 weeks: - Metformin 500 mg po bid - Glimepiride 2 mg in am  Since he started the med >> improved urination and no more thirst.  Pt does not check his sugars. He does not have a meter. ? if has hypoglycemia awareness at 68.   Pt's meals are: - Breakfast: corn flakes + whole milk - Lunch: [place] cheese sandwich - Dinner: meat (chicken) + veggies + starch - Snacks: nabs  - mild CKD, last BUN/creatinine:  Lab Results  Component Value Date   BUN 15 07/09/2014   CREATININE 1.1 07/09/2014  On Enalapril - last set of lipids: Lab Results  Component Value Date   CHOL 205* 06/22/2008   HDL 37.9* 06/22/2008   LDLDIRECT 135.3 06/22/2008   TRIG 199* 06/22/2008   CHOLHDL 5.4 CALC 06/22/2008  He was on Lipitor >> N/V. Tried for 2 weeks. - no eye exam. No DR.  - no numbness and tingling in his feet.  Pt has FH of DM in mother, brother, nephew.  ROS: Constitutional: + weight loss, no fatigue, no subjective hyperthermia/hypothermia Eyes: no blurry vision, no xerophthalmia ENT: no sore throat, no nodules palpated in throat, no dysphagia/odynophagia, no hoarseness Cardiovascular: no CP/SOB/palpitations/leg swelling Respiratory: no cough/SOB Gastrointestinal: no N/V/D/C/+ heartburn Musculoskeletal: no muscle/joint aches Skin: no rashes, + skin be dry Neurological: no tremors/numbness/tingling/dizziness Psychiatric: no depression/anxiety  Past Medical History  Diagnosis Date  . Coronary artery  disease    Past Surgical History  Procedure Laterality Date  . Coronary stent placement  19 years ago   History   Social History  . Marital Status: Married    Spouse Name: N/A    Number of Children: 3   Occupational History  . tech   Social History Main Topics  . Smoking status: Former Smoker -- 2.00 packs/day for 5 years    Types: Cigarettes    Quit date: 12/04/2000  . Smokeless tobacco: No  . Alcohol Use: Yes     Comment: weekends  -beer  . Drug Use: No   Current Outpatient Prescriptions on File Prior to Visit  Medication Sig Dispense Refill  . acetaminophen (TYLENOL) 500 MG tablet Take 500 mg by mouth every 6 (six) hours as needed for pain.      Marland Kitchen aspirin 325 MG tablet Take 325 mg by mouth daily.      . carvedilol (COREG) 12.5 MG tablet Take 1 tablet (12.5 mg total) by mouth 2 (two) times daily.  180 tablet  3  . enalapril (VASOTEC) 5 MG tablet Take 1 tablet (5 mg total) by mouth 2 (two) times daily.  180 tablet  3  . glimepiride (AMARYL) 2 MG tablet Take 1 tablet (2 mg total) by mouth daily before breakfast.  30 tablet  2  . ketoconazole (NIZORAL) 2 % cream Apply 1 application topically daily.  15 g  1  . metFORMIN (GLUCOPHAGE) 500 MG tablet Take 1 tablet (500 mg total) by  mouth 2 (two) times daily with a meal.  6012 tablet  2   No current facility-administered medications on file prior to visit.   Allergies  Allergen Reactions  . Lipitor [Atorvastatin]     N & V   Family History  Problem Relation Age of Onset  . Diabetes Mother   . Diabetes Father   . Diabetes Other     nephew   PE: BP 124/74  Pulse 67  Temp(Src) 97.9 F (36.6 C) (Oral)  Resp 12  Ht 6\' 2"  (1.88 m)  Wt 228 lb (103.42 kg)  BMI 29.26 kg/m2  SpO2 96% Wt Readings from Last 3 Encounters:  08/05/14 228 lb (103.42 kg)  07/09/14 228 lb 8 oz (103.647 kg)  02/17/14 250 lb (113.399 kg)   Constitutional: overweight, in NAD Eyes: PERRLA, EOMI, no exophthalmos ENT: moist mucous membranes, no  thyromegaly, no cervical lymphadenopathy Cardiovascular: RRR, No MRG Respiratory: CTA B Gastrointestinal: abdomen soft, NT, ND, BS+ Musculoskeletal: no deformities, strength intact in all 4 Skin: moist, warm, no rashes Neurological: no tremor with outstretched hands, DTR normal in all 4  ASSESSMENT: 1. DM2, new dx, without complications - unclear if his CAD, iCMP and CHF are related to DM  PLAN:  1. Patient with new dx of DM, recently started on oral antidiabetic regimen, which he tolerates well, but unclear level of control since not checking sugars.  - given glucometer and demonstrated use - discussed short and long term consequences of DM and how to prevent them - We discussed about options for treatment, and I suggested to:  Patient Instructions  Please continue Glimepiride 2 mg in am. Increase Metformin to 1000 mg 2x a day. Start checking sugars 2-3 x a day and write them down. Please return in 1 month with your sugar log.  - Strongly advised him to start checking sugars at different times of the day - check 2-3 times a day, rotating checks - given sugar log and advised how to fill it and to bring it at next appt  - given foot care handout and explained the principles  - given instructions for hypoglycemia management "15-15 rule"  - advised for yearly eye exams >> referred to ophthalmology - Return to clinic in 1 mo with sugar log

## 2014-08-05 NOTE — Patient Instructions (Signed)
Please continue Glimepiride 2 mg in am. Increase Metformin to 1000 mg 2x a day.  Start checking sugars 2-3 x a day and write them down.  Please return in 1 month with your sugar log.   PATIENT INSTRUCTIONS FOR TYPE 2 DIABETES:  **Please join MyChart!** - see attached instructions about how to join if you have not done so already.  DIET AND EXERCISE Diet and exercise is an important part of diabetic treatment.  We recommended aerobic exercise in the form of brisk walking (working between 40-60% of maximal aerobic capacity, similar to brisk walking) for 150 minutes per week (such as 30 minutes five days per week) along with 3 times per week performing 'resistance' training (using various gauge rubber tubes with handles) 5-10 exercises involving the major muscle groups (upper body, lower body and core) performing 10-15 repetitions (or near fatigue) each exercise. Start at half the above goal but build slowly to reach the above goals. If limited by weight, joint pain, or disability, we recommend daily walking in a swimming pool with water up to waist to reduce pressure from joints while allow for adequate exercise.    BLOOD GLUCOSES Monitoring your blood glucoses is important for continued management of your diabetes. Please check your blood glucoses 2-4 times a day: fasting, before meals and at bedtime (you can rotate these measurements - e.g. one day check before the 3 meals, the next day check before 2 of the meals and before bedtime, etc.).   HYPOGLYCEMIA (low blood sugar) Hypoglycemia is usually a reaction to not eating, exercising, or taking too much insulin/ other diabetes drugs.  Symptoms include tremors, sweating, hunger, confusion, headache, etc. Treat IMMEDIATELY with 15 grams of Carbs:   4 glucose tablets    cup regular juice/soda   2 tablespoons raisins   4 teaspoons sugar   1 tablespoon honey Recheck blood glucose in 15 mins and repeat above if still symptomatic/blood glucose  <100.  RECOMMENDATIONS TO REDUCE YOUR RISK OF DIABETIC COMPLICATIONS: * Take your prescribed MEDICATION(S) * Follow a DIABETIC diet: Complex carbs, fiber rich foods, (monounsaturated and polyunsaturated) fats * AVOID saturated/trans fats, high fat foods, >2,300 mg salt per day. * EXERCISE at least 5 times a week for 30 minutes or preferably daily.  * DO NOT SMOKE OR DRINK more than 1 drink a day. * Check your FEET every day. Do not wear tightfitting shoes. Contact us if you develop an ulcer * See your EYE doctor once a year or more if needed * Get a FLU shot once a year * Get a PNEUMONIA vaccine once before and once after age 32 years  GOALS:  * Your Hemoglobin A1c of <7%  * fasting sugars need to be <130 * after meals sugars need to be <180 (2h after you start eating) * Your Systolic BP should be 140 or lower  * Your Diastolic BP should be 80 or lower  * Your HDL (Good Cholesterol) should be 40 or higher  * Your LDL (Bad Cholesterol) should be 100 or lower. * Your Triglycerides should be 150 or lower  * Your Urine microalbumin (kidney function) should be <30 * Your Body Mass Index should be 25 or lower   We will be glad to help you achieve these goals. Our telephone number is: [phone].

## 2014-08-06 ENCOUNTER — Telehealth: Payer: Self-pay | Admitting: Internal Medicine

## 2014-08-06 NOTE — Telephone Encounter (Signed)
Patient daughter stated that the lancet that was given to her dad won't fit his meter or he just do not know how to use it, so she will be coming tomorrow so someone can show her how to use it.

## 2014-08-07 ENCOUNTER — Other Ambulatory Visit: Payer: Self-pay | Admitting: *Deleted

## 2014-08-07 ENCOUNTER — Telehealth: Payer: Self-pay | Admitting: Internal Medicine

## 2014-08-07 MED ORDER — ONETOUCH DELICA LANCETS 33G MISC: Status: DC

## 2014-08-07 NOTE — Telephone Encounter (Signed)
Patient states that his Rx was sent wrong  He needed One touch verio lancets; test strips were sent in error   Pharmacy: One touch verio lancets  Thank you

## 2014-08-07 NOTE — Telephone Encounter (Signed)
Resent for correct lancets. Pt will need test strips as well.

## 2014-08-31 ENCOUNTER — Other Ambulatory Visit (INDEPENDENT_AMBULATORY_CARE_PROVIDER_SITE_OTHER): Payer: BC Managed Care – PPO

## 2014-08-31 ENCOUNTER — Encounter: Payer: Self-pay | Admitting: Internal Medicine

## 2014-08-31 ENCOUNTER — Ambulatory Visit (INDEPENDENT_AMBULATORY_CARE_PROVIDER_SITE_OTHER): Payer: BC Managed Care – PPO | Admitting: Internal Medicine

## 2014-08-31 VITALS — BP 128/78 | HR 70 | Temp 98.2°F | Ht 74.0 in | Wt 230.8 lb

## 2014-08-31 DIAGNOSIS — Z0000 Encounter for general adult medical examination without abnormal findings: Secondary | ICD-10-CM

## 2014-08-31 DIAGNOSIS — Z1211 Encounter for screening for malignant neoplasm of colon: Secondary | ICD-10-CM

## 2014-08-31 DIAGNOSIS — I2589 Other forms of chronic ischemic heart disease: Secondary | ICD-10-CM

## 2014-08-31 DIAGNOSIS — E1159 Type 2 diabetes mellitus with other circulatory complications: Secondary | ICD-10-CM

## 2014-08-31 DIAGNOSIS — Z23 Encounter for immunization: Secondary | ICD-10-CM

## 2014-08-31 LAB — CBC WITH DIFFERENTIAL/PLATELET
Basophils Absolute: 0 K/uL (ref 0.0–0.1)
Basophils Relative: 0.3 % (ref 0.0–3.0)
Eosinophils Absolute: 0.2 K/uL (ref 0.0–0.7)
Eosinophils Relative: 2.9 % (ref 0.0–5.0)
HCT: 42.9 % (ref 39.0–52.0)
Hemoglobin: 13.9 g/dL (ref 13.0–17.0)
Lymphocytes Relative: 33 % (ref 12.0–46.0)
Lymphs Abs: 2.1 K/uL (ref 0.7–4.0)
MCHC: 32.4 g/dL (ref 30.0–36.0)
MCV: 80 fl (ref 78.0–100.0)
Monocytes Absolute: 0.6 K/uL (ref 0.1–1.0)
Monocytes Relative: 9.9 % (ref 3.0–12.0)
Neutro Abs: 3.5 K/uL (ref 1.4–7.7)
Neutrophils Relative %: 53.9 % (ref 43.0–77.0)
Platelets: 364 K/uL (ref 150.0–400.0)
RBC: 5.36 Mil/uL (ref 4.22–5.81)
RDW: 14.5 % (ref 11.5–15.5)
WBC: 6.4 K/uL (ref 4.0–10.5)

## 2014-08-31 LAB — BASIC METABOLIC PANEL
BUN: 16 mg/dL (ref 6–23)
CO2: 26 mEq/L (ref 19–32)
Calcium: 9.2 mg/dL (ref 8.4–10.5)
Chloride: 101 mEq/L (ref 96–112)
Creatinine, Ser: 1.1 mg/dL (ref 0.4–1.5)
GFR: 89.71 mL/min (ref >60.00)
Glucose, Bld: 146 mg/dL — ABNORMAL HIGH (ref 70–99)
POTASSIUM: 4.4 meq/L (ref 3.5–5.1)
SODIUM: 134 meq/L — AB (ref 135–145)

## 2014-08-31 LAB — MICROALBUMIN / CREATININE URINE RATIO
Creatinine,U: 48.8 mg/dL
MICROALB/CREAT RATIO: 0.4 mg/g (ref 0.0–30.0)
Microalb, Ur: 0.2 mg/dL (ref 0.0–1.9)

## 2014-08-31 LAB — HEPATIC FUNCTION PANEL
ALT: 13 U/L (ref 0–53)
AST: 17 U/L (ref 0–37)
Albumin: 3.7 g/dL (ref 3.5–5.2)
Alkaline Phosphatase: 57 U/L (ref 39–117)
Bilirubin, Direct: 0.1 mg/dL (ref 0.0–0.3)
Total Bilirubin: 0.6 mg/dL (ref 0.2–1.2)
Total Protein: 7.5 g/dL (ref 6.0–8.3)

## 2014-08-31 LAB — LIPID PANEL
CHOLESTEROL: 196 mg/dL (ref 0–200)
HDL: 40.9 mg/dL (ref >39.00)
LDL Cholesterol: 123 mg/dL — ABNORMAL HIGH (ref 0–99)
NonHDL: 155.1
TRIGLYCERIDES: 163 mg/dL — AB (ref 0.0–149.0)
Total CHOL/HDL Ratio: 5
VLDL: 32.6 mg/dL (ref 0.0–40.0)

## 2014-08-31 LAB — TSH: TSH: 0.98 u[IU]/mL (ref 0.35–4.50)

## 2014-08-31 NOTE — Progress Notes (Signed)
Subjective:    Patient ID: Karl [name], male    DOB: 1956-05-11, 58 y.o.   MRN: [id number]  HPI  New patient to me, here to establish with PCP  Reviewed chronic medical issues and interval medical events: CAD hx stent 1990s, ICM hx, new DM dx 07/2014  Past Medical History  Diagnosis Date  . Coronary artery disease   . Diabetes mellitus 07/2014 dx   Family History  Problem Relation Age of Onset  . Diabetes Mother   . Diabetes Father   . Diabetes Other     nephew  . Diabetes Brother   . Diabetes Maternal Grandmother   . Stroke Father 12  . Diabetes Sister   . Clotting disorder Sister 68   History  Substance Use Topics  . Smoking status: Former Smoker -- 2.00 packs/day for 5 years    Types: Cigarettes    Quit date: 12/04/2000  . Smokeless tobacco: Not on file  . Alcohol Use: Yes     Comment: weekends    Review of Systems  Constitutional: Negative for fever, activity change, appetite change, fatigue and unexpected weight change.  Respiratory: Negative for cough, chest tightness, shortness of breath and wheezing.   Cardiovascular: Negative for chest pain, palpitations and leg swelling.  Neurological: Negative for dizziness, weakness and headaches.  Psychiatric/Behavioral: Negative for dysphoric mood. The patient is not nervous/anxious.   All other systems reviewed and are negative.      Objective:   Physical Exam  BP 128/78  Pulse 70  Temp(Src) 98.2 F (36.8 C) (Oral)  Ht 6\' 2"  (1.88 m)  Wt 230 lb 12 oz (104.668 kg)  BMI 29.61 kg/m2  SpO2 98% Wt Readings from Last 3 Encounters:  08/31/14 230 lb 12 oz (104.668 kg)  08/05/14 228 lb (103.42 kg)  07/09/14 228 lb 8 oz (103.647 kg)   Constitutional: he appears well-developed and well-nourished. No distress.  HENT: Head: Normocephalic and atraumatic. Ears: B TMs ok, no erythema or effusion; Nose: Nose normal. Mouth/Throat: Oropharynx is clear and moist. No oropharyngeal exudate.  Eyes: Conjunctivae and EOM are  normal. Pupils are equal, round, and reactive to light. No scleral icterus.  Neck: Normal range of motion. Neck supple. No JVD present. No thyromegaly present.  Cardiovascular: Normal rate, regular rhythm and normal heart sounds.  No murmur heard. No BLE edema. Pulmonary/Chest: Effort normal and breath sounds normal. No respiratory distress. he has no wheezes.  Abdominal: Soft. Bowel sounds are normal. he exhibits no distension. There is no tenderness. no masses GU: defer Musculoskeletal: Normal range of motion, no joint effusions. No gross deformities Neurological: he is alert and oriented to person, place, and time. No cranial nerve deficit. Coordination, balance, strength, speech and gait are normal.  Skin: Skin is warm and dry. No rash noted. No erythema.  Psychiatric: he has a normal mood and affect. behavior is normal. Judgment and thought content normal.  Lab Results  Component Value Date   WBC 8.0 07/03/2013   HGB 15.3 07/03/2013   HCT 45.5 07/03/2013   PLT 284 07/03/2013   GLUCOSE 596* 07/09/2014   CHOL 205* 06/22/2008   TRIG 199* 06/22/2008   HDL 37.9* 06/22/2008   LDLDIRECT 135.3 06/22/2008   ALT 14 07/03/2013   AST 18 07/03/2013   NA 128* 07/09/2014   K 4.7 07/09/2014   CL 94* 07/09/2014   CREATININE 1.1 07/09/2014   BUN 15 07/09/2014   CO2 25 07/09/2014   PSA 2.79 07/09/2014  HGBA1C 13.2* 07/09/2014    Dg Abd Acute W/chest  07/03/2013   *RADIOLOGY REPORT*  Clinical Data: Nausea, vomiting  ACUTE ABDOMEN SERIES (ABDOMEN 2 VIEW & CHEST 1 VIEW)  Comparison: None.  Findings: Borderline cardiac enlargement with vascular and interstitial prominence.  No definite CHF or pneumonia.  Negative for effusion or pneumothorax.  Trachea midline.  Monitor leads overlie the chest.  No free air.  Scattered air and stool throughout the bowel.  Negative for obstruction or ileus.  Degenerative changes of the spine with a mild scoliosis.  IMPRESSION: No acute finding by plain radiography.   Original Report  Authenticated By: Judie Petit. Miles Costain, M.D.       Assessment & Plan:   CPX/v70.0 - Patient has been counseled on age-appropriate routine health concerns for screening and prevention. These are reviewed and up-to-date. Immunizations are up-to-date or declined. Labs ordered and reviewed.  Problem List Items Addressed This Visit   CARDIOMYOPATHY, ISCHEMIC     Well compensated - no angina CAD hx with stent 1990s Check echo and follow up cards as ongoing The current medical regimen is effective;  continue present plan and medications.     Relevant Orders      CBC with Differential      Hepatic function panel      TSH      2D Echocardiogram without contrast   Type II or unspecified type diabetes mellitus with peripheral circulatory disorders, uncontrolled(250.72)      New dx 07/2014 - started on meds and following with endo for same The patient is asked to make an attempt to improve diet and exercise patterns to aid in medical management of this problem. Lab Results  Component Value Date   HGBA1C 13.2* 07/09/2014       Relevant Orders      Basic metabolic panel      Lipid panel      Microalbumin / creatinine urine ratio      Ambulatory referral to Ophthalmology    Other Visit Diagnoses   Routine general medical examination at a health care facility    -  Primary    Relevant Orders       Basic metabolic panel       Lipid panel       Microalbumin / creatinine urine ratio       CBC with Differential       Hepatic function panel       TSH       2D Echocardiogram without contrast    Special screening for malignant neoplasms, colon        Relevant Orders       Ambulatory referral to Gastroenterology

## 2014-08-31 NOTE — Assessment & Plan Note (Signed)
Well compensated - no angina CAD hx with stent 1990s Check echo and follow up cards as ongoing The current medical regimen is effective;  continue present plan and medications.

## 2014-08-31 NOTE — Assessment & Plan Note (Signed)
New dx 07/2014 - started on meds and following with endo for same The patient is asked to make an attempt to improve diet and exercise patterns to aid in medical management of this problem. Lab Results  Component Value Date   HGBA1C 13.2* 07/09/2014

## 2014-08-31 NOTE — Progress Notes (Signed)
Pre visit review using our clinic review tool, if applicable. No additional management support is needed unless otherwise documented below in the visit note. 

## 2014-08-31 NOTE — Patient Instructions (Addendum)
It was good to see you today.  We have reviewed your prior records including labs and tests today  Your annual flu shot was given and/or updated today.  Health Maintenance reviewed - will refer for colon cancer screening (colonoscopy) - all other recommended immunizations and age-appropriate screenings are up-to-date.  Test(s) ordered today. Your results will be released to MyChart (or called to you) after review, usually within 72hours after test completion. If any changes need to be made, you will be notified at that same time.  Medications reviewed and updated, no changes recommended at this time.  we'll make referral for echocardiogram to evaluate your heart pump function. Our office will contact you regarding appointment(s) once made.  Will also refer for eye exam to check for diabetes in your vision -my office will call with information about this appointment once made  Continue working with your other specialists as ongoing  Please schedule followup in 6 months for semiannual diabetes mellitus exam and labs, call sooner if problems.  Health Maintenance A healthy lifestyle and preventative care can promote health and wellness.  Maintain regular health, dental, and eye exams.  Eat a healthy diet. Foods like vegetables, fruits, whole grains, low-fat dairy products, and lean protein foods contain the nutrients you need and are low in calories. Decrease your intake of foods high in solid fats, added sugars, and salt. Get information about a proper diet from your health care provider, if necessary.  Regular physical exercise is one of the most important things you can do for your health. Most adults should get at least 150 minutes of moderate-intensity exercise (any activity that increases your heart rate and causes you to sweat) each week. In addition, most adults need muscle-strengthening exercises on 2 or more days a week.   Maintain a healthy weight. The body mass index (BMI) is a  screening tool to identify possible weight problems. It provides an estimate of body fat based on height and weight. Your health care provider can find your BMI and can help you achieve or maintain a healthy weight. For males 20 years and older:  A BMI below 18.5 is considered underweight.  A BMI of 18.5 to 24.9 is normal.  A BMI of 25 to 29.9 is considered overweight.  A BMI of 30 and above is considered obese.  Maintain normal blood lipids and cholesterol by exercising and minimizing your intake of saturated fat. Eat a balanced diet with plenty of fruits and vegetables. Blood tests for lipids and cholesterol should begin at age 50 and be repeated every 5 years. If your lipid or cholesterol levels are high, you are over age 71, or you are at high risk for heart disease, you may need your cholesterol levels checked more frequently.Ongoing high lipid and cholesterol levels should be treated with medicines if diet and exercise are not working.  If you smoke, find out from your health care provider how to quit. If you do not use tobacco, do not start.  Lung cancer screening is recommended for adults aged 55-80 years who are at high risk for developing lung cancer because of a history of smoking. A yearly low-dose CT scan of the lungs is recommended for people who have at least a 30-pack-year history of smoking and are current smokers or have quit within the past 15 years. A pack year of smoking is smoking an average of 1 pack of cigarettes a day for 1 year (for example, a 30-pack-year history of smoking could mean  smoking 1 pack a day for 30 years or 2 packs a day for 15 years). Yearly screening should continue until the smoker has stopped smoking for at least 15 years. Yearly screening should be stopped for people who develop a health problem that would prevent them from having lung cancer treatment.  If you choose to drink alcohol, do not have more than 2 drinks per day. One drink is considered to be  12 oz (360 mL) of beer, 5 oz (150 mL) of wine, or 1.5 oz (45 mL) of liquor.  Avoid the use of street drugs. Do not share needles with anyone. Ask for help if you need support or instructions about stopping the use of drugs.  High blood pressure causes heart disease and increases the risk of stroke. Blood pressure should be checked at least every 1-2 years. Ongoing high blood pressure should be treated with medicines if weight loss and exercise are not effective.  If you are 45-59 years old, ask your health care provider if you should take aspirin to prevent heart disease.  Diabetes screening involves taking a blood sample to check your fasting blood sugar level. This should be done once every 3 years after age 31 if you are at a normal weight and without risk factors for diabetes. Testing should be considered at a younger age or be carried out more frequently if you are overweight and have at least 1 risk factor for diabetes.  Colorectal cancer can be detected and often prevented. Most routine colorectal cancer screening begins at the age of 19 and continues through age 55. However, your health care provider may recommend screening at an earlier age if you have risk factors for colon cancer. On a yearly basis, your health care provider may provide home test kits to check for hidden blood in the stool. A small camera at the end of a tube may be used to directly examine the colon (sigmoidoscopy or colonoscopy) to detect the earliest forms of colorectal cancer. Talk to your health care provider about this at age 57 when routine screening begins. A direct exam of the colon should be repeated every 5-10 years through age 87, unless early forms of precancerous polyps or small growths are found.  People who are at an increased risk for hepatitis B should be screened for this virus. You are considered at high risk for hepatitis B if:  You were born in a country where hepatitis B occurs often. Talk with your  health care provider about which countries are considered high risk.  Your parents were born in a high-risk country and you have not received a shot to protect against hepatitis B (hepatitis B vaccine).  You have HIV or AIDS.  You use needles to inject street drugs.  You live with, or have sex with, someone who has hepatitis B.  You are a man who has sex with other men (MSM).  You get hemodialysis treatment.  You take certain medicines for conditions like cancer, organ transplantation, and autoimmune conditions.  Hepatitis C blood testing is recommended for all people born from [birth year] through 1967 and any individual with known risk factors for hepatitis C.  Healthy men should no longer receive prostate-specific antigen (PSA) blood tests as part of routine cancer screening. Talk to your health care provider about prostate cancer screening.  Testicular cancer screening is not recommended for adolescents or adult males who have no symptoms. Screening includes self-exam, a health care provider exam, and other screening tests.  Consult with your health care provider about any symptoms you have or any concerns you have about testicular cancer.  Practice safe sex. Use condoms and avoid high-risk sexual practices to reduce the spread of sexually transmitted infections (STIs).  You should be screened for STIs, including gonorrhea and chlamydia if:  You are sexually active and are younger than 24 years.  You are older than 24 years, and your health care provider tells you that you are at risk for this type of infection.  Your sexual activity has changed since you were last screened, and you are at an increased risk for chlamydia or gonorrhea. Ask your health care provider if you are at risk.  If you are at risk of being infected with HIV, it is recommended that you take a prescription medicine daily to prevent HIV infection. This is called pre-exposure prophylaxis (PrEP). You are considered at  risk if:  You are a man who has sex with other men (MSM).  You are a heterosexual man who is sexually active with multiple partners.  You take drugs by injection.  You are sexually active with a partner who has HIV.  Talk with your health care provider about whether you are at high risk of being infected with HIV. If you choose to begin PrEP, you should first be tested for HIV. You should then be tested every 3 months for as long as you are taking PrEP.  Use sunscreen. Apply sunscreen liberally and repeatedly throughout the day. You should seek shade when your shadow is shorter than you. Protect yourself by wearing long sleeves, pants, a wide-brimmed hat, and sunglasses year round whenever you are outdoors.  Tell your health care provider of new moles or changes in moles, especially if there is a change in shape or color. Also, tell your health care provider if a mole is larger than the size of a pencil eraser.  A one-time screening for abdominal aortic aneurysm (AAA) and surgical repair of large AAAs by ultrasound is recommended for men aged 65-75 years who are current or former smokers.  Stay current with your vaccines (immunizations). Document Released: 05/18/2008 Document Revised: 11/25/2013 Document Reviewed: 04/17/2011 [facility] Patient Information 2015 [place], [place]. This information is not intended to replace advice given to you by your health care provider. Make sure you discuss any questions you have with your health care provider.

## 2014-09-01 NOTE — Addendum Note (Signed)
Addended by: Tonye Becket on: 09/01/2014 03:28 PM   Modules accepted: Orders

## 2014-09-06 ENCOUNTER — Emergency Department (HOSPITAL_COMMUNITY)
Admission: EM | Admit: 2014-09-06 | Discharge: 2014-09-06 | Disposition: A | Discharge status: Home / Self Care | Payer: BC Managed Care – PPO | Attending: Emergency Medicine | Admitting: Emergency Medicine

## 2014-09-06 ENCOUNTER — Encounter (HOSPITAL_COMMUNITY): Payer: Self-pay | Admitting: Emergency Medicine

## 2014-09-06 DIAGNOSIS — E119 Type 2 diabetes mellitus without complications: Secondary | ICD-10-CM | POA: Diagnosis not present

## 2014-09-06 DIAGNOSIS — Z87891 Personal history of nicotine dependence: Secondary | ICD-10-CM | POA: Diagnosis not present

## 2014-09-06 DIAGNOSIS — I2510 Atherosclerotic heart disease of native coronary artery without angina pectoris: Secondary | ICD-10-CM | POA: Diagnosis not present

## 2014-09-06 DIAGNOSIS — Z79899 Other long term (current) drug therapy: Secondary | ICD-10-CM | POA: Diagnosis not present

## 2014-09-06 DIAGNOSIS — R1084 Generalized abdominal pain: Secondary | ICD-10-CM | POA: Diagnosis not present

## 2014-09-06 DIAGNOSIS — Z7982 Long term (current) use of aspirin: Secondary | ICD-10-CM | POA: Diagnosis not present

## 2014-09-06 DIAGNOSIS — R1013 Epigastric pain: Secondary | ICD-10-CM | POA: Diagnosis present

## 2014-09-06 DIAGNOSIS — R112 Nausea with vomiting, unspecified: Secondary | ICD-10-CM | POA: Insufficient documentation

## 2014-09-06 DIAGNOSIS — Z9861 Coronary angioplasty status: Secondary | ICD-10-CM | POA: Diagnosis not present

## 2014-09-06 LAB — CBC WITH DIFFERENTIAL/PLATELET
BASOS ABS: 0 10*3/uL (ref 0.0–0.1)
BASOS PCT: 0 % (ref 0–1)
EOS ABS: 0 10*3/uL (ref 0.0–0.7)
Eosinophils Relative: 0 % (ref 0–5)
HCT: 47 % (ref 39.0–52.0)
HEMOGLOBIN: 16.2 g/dL (ref 13.0–17.0)
LYMPHS PCT: 18 % (ref 12–46)
Lymphs Abs: 2.3 10*3/uL (ref 0.7–4.0)
MCH: 26.2 pg (ref 26.0–34.0)
MCHC: 34.5 g/dL (ref 30.0–36.0)
MCV: 76.1 fL — AB (ref 78.0–100.0)
Monocytes Absolute: 1 10*3/uL (ref 0.1–1.0)
Monocytes Relative: 8 % (ref 3–12)
NEUTROS ABS: 9.6 10*3/uL — AB (ref 1.7–7.7)
Neutrophils Relative %: 74 % (ref 43–77)
Platelets: 364 10*3/uL (ref 150–400)
RBC: 6.18 MIL/uL — ABNORMAL HIGH (ref 4.22–5.81)
RDW: 13.7 % (ref 11.5–15.5)
WBC: 12.9 10*3/uL — ABNORMAL HIGH (ref 4.0–10.5)

## 2014-09-06 LAB — COMPREHENSIVE METABOLIC PANEL
ALBUMIN: 4.1 g/dL (ref 3.5–5.2)
ALK PHOS: 78 U/L (ref 39–117)
ALT: 12 U/L (ref 0–53)
AST: 15 U/L (ref 0–37)
Anion gap: 17 — ABNORMAL HIGH (ref 5–15)
BILIRUBIN TOTAL: 0.7 mg/dL (ref 0.3–1.2)
BUN: 15 mg/dL (ref 6–23)
CHLORIDE: 93 meq/L — AB (ref 96–112)
CO2: 22 meq/L (ref 19–32)
Calcium: 9.9 mg/dL (ref 8.4–10.5)
Creatinine, Ser: 0.97 mg/dL (ref 0.50–1.35)
GFR calc Af Amer: >90 mL/min (ref >90)
GFR, EST NON AFRICAN AMERICAN: 89 mL/min — AB (ref >90)
Glucose, Bld: 209 mg/dL — ABNORMAL HIGH (ref 70–99)
POTASSIUM: 4.2 meq/L (ref 3.7–5.3)
SODIUM: 132 meq/L — AB (ref 137–147)
Total Protein: 8.5 g/dL — ABNORMAL HIGH (ref 6.0–8.3)

## 2014-09-06 LAB — URINALYSIS, ROUTINE W REFLEX MICROSCOPIC
Bilirubin Urine: NEGATIVE
GLUCOSE, UA: 500 mg/dL — AB
Ketones, ur: 15 mg/dL — AB
Leukocytes, UA: NEGATIVE
Nitrite: NEGATIVE
Protein, ur: 30 mg/dL — AB
SPECIFIC GRAVITY, URINE: 1.031 — AB (ref 1.005–1.030)
Urobilinogen, UA: 0.2 mg/dL (ref 0.0–1.0)
pH: 5.5 (ref 5.0–8.0)

## 2014-09-06 LAB — URINE MICROSCOPIC-ADD ON

## 2014-09-06 LAB — LIPASE, BLOOD: Lipase: 69 U/L — ABNORMAL HIGH (ref 11–59)

## 2014-09-06 MED ORDER — MORPHINE SULFATE 4 MG/ML IJ SOLN
4.0000 mg | Freq: Once | INTRAMUSCULAR | Status: AC
  Administered 2014-09-06: 4 mg via INTRAVENOUS
  Filled 2014-09-06: qty 1

## 2014-09-06 MED ORDER — SODIUM CHLORIDE 0.9 % IV BOLUS (SEPSIS)
1000.0000 mL | Freq: Once | INTRAVENOUS | Status: AC
  Administered 2014-09-06: 1000 mL via INTRAVENOUS

## 2014-09-06 MED ORDER — FAMOTIDINE IN NACL 20-0.9 MG/50ML-% IV SOLN
20.0000 mg | Freq: Once | INTRAVENOUS | Status: AC
  Administered 2014-09-06: 20 mg via INTRAVENOUS
  Filled 2014-09-06: qty 50

## 2014-09-06 MED ORDER — PROMETHAZINE HCL 25 MG PO TABS: 25.0000 mg | ORAL_TABLET | Freq: Four times a day (QID) | ORAL | Status: DC | PRN

## 2014-09-06 MED ORDER — ONDANSETRON HCL 4 MG/2ML IJ SOLN
4.0000 mg | Freq: Once | INTRAMUSCULAR | Status: AC
  Administered 2014-09-06: 4 mg via INTRAVENOUS
  Filled 2014-09-06: qty 2

## 2014-09-06 MED ORDER — HYDROCODONE-ACETAMINOPHEN 5-325 MG PO TABS: ORAL_TABLET | ORAL | Status: DC

## 2014-09-06 NOTE — ED Notes (Signed)
Pt reports hx diabetes, started vomiting yesterday. Vomited 5x today. Abdominal pain 8/10.

## 2014-09-06 NOTE — ED Notes (Signed)
Pt ambulatory upon discharge. His daughter is with him and will drive him home.

## 2014-09-06 NOTE — ED Notes (Signed)
Pt tolerated PO fluids well  

## 2014-09-06 NOTE — Discharge Instructions (Signed)
Return to the emergency room for severely worsening abdominal pain, abdominal pain that localizes to a particular area (especially the right lower part of the belly), pain that persists past 8-10 hours, blood in stool or vomit, severe weakness, fainting, or fever.   Maintain hydration by drinking small amounts of clear fluids frequently, then soft diet, and then advance to a solid diet as tolerated. Avoid foods that are spicy, high in fat or dairy.  Take vicodin for breakthrough pain, do not drink alcohol, drive, care for children or do other critical tasks while taking vicodin.  Please follow with your primary care doctor in the next 2 days for a check-up. They must obtain records for further management.   Do not hesitate to return to the Emergency Department for any new, worsening or concerning symptoms.

## 2014-09-06 NOTE — ED Provider Notes (Signed)
CSN: [id number]     Arrival date & time 09/06/14  0802 History   First MD Initiated Contact with Patient 09/06/14 0827     Chief Complaint  Patient presents with  . Emesis  . Abdominal Pain     (Consider location/radiation/quality/duration/timing/severity/associated sxs/prior Treatment) HPI  Karl [name] is a 58 y.o. male with past medical history for non-insulin-dependent diabetes, coronary artery disease complaining of multiple episodes of nonbloody, nonbilious, coffee-ground emesis starting yesterday morning. Patient states he vomited 5 times this morning. A normally formed bowel movement yesterday, he is passing flatus, does not have history of multiple abdominal surgeries. He is a epigastric abdominal pain which she rates it 8/10, cannot identify any exacerbating or alleviating factors. Patient reports subjective fever and chills. Denies sick contacts, chest pain, shortness of breath, melena, hematochezia, history of blood thinners.    Past Medical History  Diagnosis Date  . Coronary artery disease   . Diabetes mellitus 07/2014 dx   Past Surgical History  Procedure Laterality Date  . Coronary stent placement  1998   Family History  Problem Relation Age of Onset  . Diabetes Mother   . Diabetes Father   . Diabetes Other     nephew  . Diabetes Brother   . Diabetes Maternal Grandmother   . Stroke Father 65  . Diabetes Sister   . Clotting disorder Sister 57   History  Substance Use Topics  . Smoking status: Former Smoker -- 2.00 packs/day for 5 years    Types: Cigarettes    Quit date: 12/04/2000  . Smokeless tobacco: Not on file  . Alcohol Use: Yes     Comment: weekends    Review of Systems  10 systems reviewed and found to be negative, except as noted in the HPI.   Allergies  Lipitor  Home Medications   Prior to Admission medications   Medication Sig Start Date End Date Taking? Authorizing Provider  acetaminophen (TYLENOL) 500 MG tablet Take 500 mg by mouth  every 6 (six) hours as needed for pain.   Yes Historical Provider, MD  aspirin 325 MG tablet Take 325 mg by mouth daily.   Yes Historical Provider, MD  carvedilol (COREG) 12.5 MG tablet Take 1 tablet (12.5 mg total) by mouth 2 (two) times daily. 10/28/13 10/28/14 Yes Marinus Maw, MD  enalapril (VASOTEC) 5 MG tablet Take 1 tablet (5 mg total) by mouth 2 (two) times daily. 10/28/13  Yes Marinus Maw, MD  glimepiride (AMARYL) 2 MG tablet Take 1 tablet (2 mg total) by mouth daily before breakfast. 07/09/14  Yes Pecola Lawless, MD  metFORMIN (GLUCOPHAGE) 1000 MG tablet Take 1 tablet (1,000 mg total) by mouth 2 (two) times daily with a meal. 08/05/14  Yes Carlus Pavlov, MD  HYDROcodone-acetaminophen (NORCO/VICODIN) 5-325 MG per tablet Take 1-2 tablets by mouth every 6 hours as needed for pain. 09/06/14   Milarose Savich, PA-C  promethazine (PHENERGAN) 25 MG tablet Take 1 tablet (25 mg total) by mouth every 6 (six) hours as needed for nausea or vomiting. 09/06/14   Rhaelyn Giron, PA-C   BP 124/77  Pulse 84  Temp(Src) 97.8 F (36.6 C) (Oral)  Resp 16  SpO2 99% Physical Exam  Nursing note and vitals reviewed. Constitutional: He is oriented to person, place, and time. He appears well-developed and well-nourished. No distress.  HENT:  Head: Normocephalic.  Mouth/Throat: Oropharynx is clear and moist.  Eyes: Conjunctivae and EOM are normal. Pupils are equal, round, and reactive  to light.  Neck: Normal range of motion. Neck supple.  Cardiovascular: Normal rate, regular rhythm and intact distal pulses.   Pulmonary/Chest: Effort normal and breath sounds normal. No stridor. No respiratory distress. He has no wheezes. He has no rales. He exhibits no tenderness.  Abdominal: Soft. Bowel sounds are normal.  Mild, diffuse tenderness to palpation with no guarding or rebound.  Murphy sign negative, no tenderness to palpation over McBurney's point, Rovsings, Psoas and obturator all negative.    Musculoskeletal: Normal range of motion. He exhibits no edema and no tenderness.  Neurological: He is alert and oriented to person, place, and time.  Skin: Skin is warm.  Psychiatric: He has a normal mood and affect.    ED Course  Procedures (including critical care time) Labs Review Labs Reviewed  COMPREHENSIVE METABOLIC PANEL - Abnormal; Notable for the following:    Sodium 132 (*)    Chloride 93 (*)    Glucose, Bld 209 (*)    Total Protein 8.5 (*)    GFR calc non Af Amer 89 (*)    Anion gap 17 (*)    All other components within normal limits  LIPASE, BLOOD - Abnormal; Notable for the following:    Lipase 69 (*)    All other components within normal limits  CBC WITH DIFFERENTIAL - Abnormal; Notable for the following:    WBC 12.9 (*)    RBC 6.18 (*)    MCV 76.1 (*)    Neutro Abs 9.6 (*)    All other components within normal limits  URINALYSIS, ROUTINE W REFLEX MICROSCOPIC - Abnormal; Notable for the following:    Color, Urine AMBER (*)    APPearance CLOUDY (*)    Specific Gravity, Urine 1.031 (*)    Glucose, UA 500 (*)    Hgb urine dipstick SMALL (*)    Ketones, ur 15 (*)    Protein, ur 30 (*)    All other components within normal limits  URINE MICROSCOPIC-ADD ON - Abnormal; Notable for the following:    Casts HYALINE CASTS (*)    All other components within normal limits    Imaging Review No results found.   EKG Interpretation None      MDM   Final diagnoses:  Non-intractable vomiting with nausea, vomiting of unspecified type  Abdominal pain, generalized    Filed Vitals:   09/06/14 0809 09/06/14 0910 09/06/14 1108  BP: 135/88 146/84 124/77  Pulse: 106 84 84  Temp: 97.8 F (36.6 C)    TempSrc: Oral    Resp: 16 16 16   SpO2: 100% 97% 99%    Medications  sodium chloride 0.9 % bolus 1,000 mL (0 mLs Intravenous Stopped 09/06/14 1103)  ondansetron (ZOFRAN) injection 4 mg (4 mg Intravenous Given 09/06/14 0918)  famotidine (PEPCID) IVPB 20 mg (0 mg  Intravenous Stopped 09/06/14 1023)  morphine 4 MG/ML injection 4 mg (4 mg Intravenous Given 09/06/14 0918)    Karl [name] is a 58 y.o. male presenting with multiple episodes of vomiting onset yesterday associated with epigastric abdominal pain which began after the vomiting. Patient is afebrile, well-appearing, mildly tachycardic, abdominal exam is nonsurgical but no focal tenderness palpation. Plan is to fluid bolus, pain medications, basic blood work and by mouth challenge.  Urinalysis with 15 ketones, blood sugar is mildly elevated at 209, patient also has a nonspecific elevation in his lipase at 69. Minor leukocytosis of 12.9.  10:45 AM: Patient seen and evaluated the bedside, he reports significant improvement  in pain and nausea. Abdominal exam remains consistent with no focal tenderness to palpation, guarding or rebound. Plan by mouth challenge after patient gets his bolus.   Patient's passed by mouth challenge, amenable to discharge.  This is a shared visit with the attending physician who personally evaluated the patient and agrees with the care plan.    Evaluation does not show pathology that would require ongoing emergent intervention or inpatient treatment. Pt is hemodynamically stable and mentating appropriately. Discussed findings and plan with patient/guardian, who agrees with care plan. All questions answered. Return precautions discussed and outpatient follow up given.   Discharge Medication List as of 09/06/2014 10:52 AM    START taking these medications   Details  HYDROcodone-acetaminophen (NORCO/VICODIN) 5-325 MG per tablet Take 1-2 tablets by mouth every 6 hours as needed for pain., Print    promethazine (PHENERGAN) 25 MG tablet Take 1 tablet (25 mg total) by mouth every 6 (six) hours as needed for nausea or vomiting., Starting 09/06/2014, Until Discontinued, [employer]            Lexa Coronado, PA-C 09/06/14 1122

## 2014-09-07 LAB — CBG MONITORING, ED: GLUCOSE-CAPILLARY: 163 mg/dL — AB (ref 70–99)

## 2014-09-07 NOTE — ED Provider Notes (Signed)
Medical screening examination/treatment/procedure(s) were conducted as a shared visit with non-physician practitioner(s) and myself.  I personally evaluated the patient during the encounter.  Pt with epigastric pain, nv, hx same. Hx diabetes. abd soft, mild epigastric tenderness, no rebound or guarding. Ivf, nausea rx.    Suzi Roots, MD 09/07/14 [phone]

## 2014-09-10 ENCOUNTER — Other Ambulatory Visit (HOSPITAL_COMMUNITY): Payer: Self-pay | Admitting: Internal Medicine

## 2014-09-10 DIAGNOSIS — Z0000 Encounter for general adult medical examination without abnormal findings: Secondary | ICD-10-CM

## 2014-09-10 DIAGNOSIS — I259 Chronic ischemic heart disease, unspecified: Secondary | ICD-10-CM

## 2014-09-11 ENCOUNTER — Other Ambulatory Visit (HOSPITAL_COMMUNITY): Payer: BC Managed Care – PPO

## 2014-09-14 ENCOUNTER — Telehealth: Payer: Self-pay | Admitting: Internal Medicine

## 2014-09-14 ENCOUNTER — Encounter (HOSPITAL_COMMUNITY): Payer: Self-pay | Admitting: Internal Medicine

## 2014-09-14 NOTE — Telephone Encounter (Signed)
I did not call and have spoken with daughter and let her know to call his PCP

## 2014-09-14 NOTE — Telephone Encounter (Signed)
New message   Daughter calling stating someone called her father today returning call back .

## 2014-09-16 ENCOUNTER — Ambulatory Visit (HOSPITAL_COMMUNITY): Payer: BC Managed Care – PPO | Attending: Internal Medicine | Admitting: Radiology

## 2014-09-16 DIAGNOSIS — I10 Essential (primary) hypertension: Secondary | ICD-10-CM | POA: Diagnosis not present

## 2014-09-16 DIAGNOSIS — E119 Type 2 diabetes mellitus without complications: Secondary | ICD-10-CM | POA: Insufficient documentation

## 2014-09-16 DIAGNOSIS — Z87891 Personal history of nicotine dependence: Secondary | ICD-10-CM | POA: Insufficient documentation

## 2014-09-16 DIAGNOSIS — I259 Chronic ischemic heart disease, unspecified: Secondary | ICD-10-CM | POA: Insufficient documentation

## 2014-09-16 DIAGNOSIS — Z0000 Encounter for general adult medical examination without abnormal findings: Secondary | ICD-10-CM

## 2014-09-16 NOTE — Progress Notes (Signed)
Echocardiogram performed.  

## 2014-09-17 ENCOUNTER — Encounter: Payer: Self-pay | Admitting: Internal Medicine

## 2014-09-17 ENCOUNTER — Ambulatory Visit (INDEPENDENT_AMBULATORY_CARE_PROVIDER_SITE_OTHER): Payer: BC Managed Care – PPO | Admitting: Internal Medicine

## 2014-09-17 VITALS — BP 118/72 | HR 75 | Temp 98.5°F | Resp 12 | Wt 229.0 lb

## 2014-09-17 DIAGNOSIS — IMO0001 Reserved for inherently not codable concepts without codable children: Secondary | ICD-10-CM

## 2014-09-17 DIAGNOSIS — E1165 Type 2 diabetes mellitus with hyperglycemia: Secondary | ICD-10-CM

## 2014-09-17 NOTE — Patient Instructions (Signed)
Please continue: - Metformin 1000 mg 2x a day - Glimepiride 2 mg in am  Please call Cor[facility] Dandy to reschedule an appt with Dr Ashley Royalty.  Please return in 1.5 month with your sugar log.

## 2014-09-17 NOTE — Progress Notes (Signed)
Patient ID: Karl [name], male   DOB: 1956/04/13, 58 y.o.   MRN: [id number]  HPI: Karl [name] is a 58 y.o.-year-old male, initially referred by Dr. Lewayne Bunting, for management of DM2, non-insulin-dependent, uncontrolled, without complications. Does not have a PCP yet.   Patient has been diagnosed with diabetes this mo. He had increased thirst and urination and also lost 30 lbs, now stabilized.  Last hemoglobin A1c was: Lab Results  Component Value Date   HGBA1C 13.2* 07/09/2014   Pt is on a regimen of: - Metformin 500 >> 1000 mg po bid - Glimepiride 2 mg in am  Pt is now checking his sugars 2x a day - but does not bring log or meter: - am: 95, 150-165 - 2h after dinner: 150-165 ? if has hypoglycemia awareness at 70. Highest: 185. Lowest: 95.  Pt's meals are: - Breakfast: corn flakes + whole milk - Lunch: [place] cheese sandwich - Dinner: meat (chicken) + veggies + starch - Snacks: nabs  - mild CKD, last BUN/creatinine:  Lab Results  Component Value Date   BUN 15 09/06/2014   CREATININE 0.97 09/06/2014  On Enalapril - last set of lipids: Lab Results  Component Value Date   CHOL 196 08/31/2014   HDL 40.90 08/31/2014   LDLCALC 123* 08/31/2014   LDLDIRECT 135.3 06/22/2008   TRIG 163.0* 08/31/2014   CHOLHDL 5 08/31/2014  He was on Lipitor >> N/V. Tried for 2 weeks. - no eye exam. No DR. Needs to reschedule appt with dr Ashley Royalty. - no numbness and tingling in his feet.  ROS: Constitutional: no weight loss, no fatigue, no subjective hyperthermia/hypothermia Eyes: no blurry vision, no xerophthalmia ENT: no sore throat, no nodules palpated in throat, no dysphagia/odynophagia, no hoarseness Cardiovascular: no CP/SOB/palpitations/leg swelling Respiratory: no cough/SOB Gastrointestinal: + N/no V/D/C/heartburn Musculoskeletal: no muscle/joint aches Skin: no rashes, + skin dry Neurological: no tremors/numbness/tingling/dizziness  I reviewed pt's medications, allergies, PMH, social  hx, family hx and no changes required, except as mentioned above.  PE: BP 118/72  Pulse 75  Temp(Src) 98.5 F (36.9 C) (Oral)  Resp 12  Wt 229 lb (103.874 kg)  SpO2 97% Wt Readings from Last 3 Encounters:  09/17/14 229 lb (103.874 kg)  08/31/14 230 lb 12 oz (104.668 kg)  08/05/14 228 lb (103.42 kg)   Constitutional: overweight, in NAD Eyes: PERRLA, EOMI, no exophthalmos ENT: moist mucous membranes, no thyromegaly, no cervical lymphadenopathy Cardiovascular: RRR, No MRG Respiratory: CTA B Gastrointestinal: abdomen soft, NT, ND, BS+ Musculoskeletal: no deformities, strength intact in all 4 Skin: moist, warm, no rashes Neurological: no tremor with outstretched hands, DTR normal in all 4  ASSESSMENT: 1. DM2, new dx, without complications - unclear if his CAD, iCMP and CHF are related to DM  PLAN:  1. Patient with new dx of DM, recently started on oral antidiabetic regimen, which he tolerates well, but unclear level of control.  - We discussed about options for treatment, and I suggested to:  Patient Instructions  Please continue: - Metformin 1000 mg 2x a day - Glimepiride 2 mg in am  Please return in 1.5 month with your sugar log.   - continue checking sugars at different times of the day - check 2-3 times a day, rotating checks >> bring log at next visit. - advised for yearly eye exams >> he will have an appt with dr Ashley Royalty - he had the flu vaccine this season - Return to clinic in 1.5 mo with sugar log

## 2014-09-24 ENCOUNTER — Encounter (INDEPENDENT_AMBULATORY_CARE_PROVIDER_SITE_OTHER): Payer: BC Managed Care – PPO | Admitting: Ophthalmology

## 2014-09-24 DIAGNOSIS — H43813 Vitreous degeneration, bilateral: Secondary | ICD-10-CM

## 2014-09-24 DIAGNOSIS — H35033 Hypertensive retinopathy, bilateral: Secondary | ICD-10-CM

## 2014-09-24 DIAGNOSIS — E11339 Type 2 diabetes mellitus with moderate nonproliferative diabetic retinopathy without macular edema: Secondary | ICD-10-CM

## 2014-09-24 DIAGNOSIS — E11319 Type 2 diabetes mellitus with unspecified diabetic retinopathy without macular edema: Secondary | ICD-10-CM

## 2014-09-24 DIAGNOSIS — I10 Essential (primary) hypertension: Secondary | ICD-10-CM

## 2014-09-24 DIAGNOSIS — H34812 Central retinal vein occlusion, left eye: Secondary | ICD-10-CM

## 2014-09-24 LAB — HM DIABETES EYE EXAM

## 2014-10-03 ENCOUNTER — Encounter: Payer: Self-pay | Admitting: Internal Medicine

## 2014-10-05 ENCOUNTER — Encounter: Payer: Self-pay | Admitting: Internal Medicine

## 2014-10-07 ENCOUNTER — Telehealth: Payer: Self-pay | Admitting: Internal Medicine

## 2014-10-07 ENCOUNTER — Other Ambulatory Visit: Payer: Self-pay | Admitting: *Deleted

## 2014-10-07 DIAGNOSIS — R81 Glycosuria: Secondary | ICD-10-CM

## 2014-10-07 MED ORDER — GLIMEPIRIDE 2 MG PO TABS: 2.0000 mg | ORAL_TABLET | Freq: Every day | ORAL | Status: DC

## 2014-10-07 NOTE — Telephone Encounter (Signed)
Rx refill sent.

## 2014-10-07 NOTE — Telephone Encounter (Signed)
Pt daughter called in said that pharmacy told her that he would have to call office for a refill because it was denied.   glimepiride (AMARYL) 2 MG tablet [[id number]]    Best number  [phone]

## 2014-10-07 NOTE — Telephone Encounter (Signed)
Called pt's daughter and advised her that refill of glimepiride was sent to [facility] pharmacy.

## 2014-10-07 NOTE — Telephone Encounter (Signed)
Pt see Dr. Elvera Lennox for his diabetes forwarding msg to them...Raechel Chute

## 2014-12-14 ENCOUNTER — Other Ambulatory Visit: Payer: Self-pay | Admitting: Internal Medicine

## 2015-01-10 ENCOUNTER — Other Ambulatory Visit: Payer: Self-pay | Admitting: Internal Medicine

## 2015-01-11 ENCOUNTER — Telehealth: Payer: Self-pay | Admitting: Physician Assistant

## 2015-01-11 DIAGNOSIS — I255 Ischemic cardiomyopathy: Secondary | ICD-10-CM

## 2015-01-11 MED ORDER — ENALAPRIL MALEATE 5 MG PO TABS: 5.0000 mg | ORAL_TABLET | Freq: Two times a day (BID) | ORAL | Status: DC

## 2015-01-11 MED ORDER — CARVEDILOL 12.5 MG PO TABS: 12.5000 mg | ORAL_TABLET | Freq: Two times a day (BID) | ORAL | Status: DC

## 2015-01-11 NOTE — Telephone Encounter (Signed)
Received call from answering service for this patient to number [phone] regarding med refill. Tried to call patient back multiple times but got the message "This person's mailbox is full. You cannot leave a message. Bye."  Addendum: Finally reached patient on 4th try.  He said he talked to [facility] this AM because his Coreg/Enalapril were expired. They submitted a refill request but he went to pick up meds this afternoon and they were not yet filled. I explained that we typically need a 48-hour turnaround time for med refills and told him it's helpful if we get earlier notice of needed refills. He verbalized understanding and says he did not realize they were expired. He is out as of today. I verified rx name and dose - sent in refill for carvedilol and enalapril as below. F/u as planned with Dr. Ladona Ridgel.  Dayna Dunn PA-C

## 2015-01-12 NOTE — Telephone Encounter (Signed)
I got a couple calls last night from patients awaiting refills. See phone note from last night - I refilled his Coreg and enalapril. Dayna Dunn PA-C

## 2015-02-03 ENCOUNTER — Encounter: Payer: Self-pay | Admitting: Internal Medicine

## 2015-02-03 ENCOUNTER — Ambulatory Visit (INDEPENDENT_AMBULATORY_CARE_PROVIDER_SITE_OTHER): Payer: BLUE CROSS/BLUE SHIELD | Admitting: Internal Medicine

## 2015-02-03 VITALS — BP 130/80 | HR 76 | Ht 74.0 in | Wt 237.6 lb

## 2015-02-03 DIAGNOSIS — I5022 Chronic systolic (congestive) heart failure: Secondary | ICD-10-CM

## 2015-02-03 DIAGNOSIS — I255 Ischemic cardiomyopathy: Secondary | ICD-10-CM

## 2015-02-03 DIAGNOSIS — I10 Essential (primary) hypertension: Secondary | ICD-10-CM

## 2015-02-03 MED ORDER — CARVEDILOL 12.5 MG PO TABS: 12.5000 mg | ORAL_TABLET | Freq: Two times a day (BID) | ORAL | Status: DC

## 2015-02-03 MED ORDER — ASPIRIN 325 MG PO TABS: 325.0000 mg | ORAL_TABLET | Freq: Every day | ORAL | Status: DC

## 2015-02-03 MED ORDER — ENALAPRIL MALEATE 5 MG PO TABS: 5.0000 mg | ORAL_TABLET | Freq: Two times a day (BID) | ORAL | Status: DC

## 2015-02-03 NOTE — Progress Notes (Signed)
HPI Karl [name] returns today for CHF followup. He is a pleasant 59 yo man with a h/o chronic systolic heart failure, HTN, and remote substance abuse. When I saw the patient several months ago, we decided to start carvedilol, and I strongly encouraged the patient to reduce his salt intake. Since then he has been stable. His blood pressure is much improved. He denies chest pain, shortness of breath, or syncope. He is pending dental extraction. Allergies  Allergen Reactions  . Lipitor [Atorvastatin] Nausea And Vomiting     Current Outpatient Prescriptions  Medication Sig Dispense Refill  . acetaminophen (TYLENOL) 500 MG tablet Take 500 mg by mouth every 6 (six) hours as needed for pain.    Marland Kitchen aspirin 325 MG tablet Take 325 mg by mouth daily.    . carvedilol (COREG) 12.5 MG tablet Take 1 tablet (12.5 mg total) by mouth 2 (two) times daily. 60 tablet 3  . enalapril (VASOTEC) 5 MG tablet Take 1 tablet (5 mg total) by mouth 2 (two) times daily. 60 tablet 3  . glimepiride (AMARYL) 2 MG tablet TAKE ONE TABLET BY MOUTH ONCE DAILY BEFORE BREAKFAST 30 tablet 0  . metFORMIN (GLUCOPHAGE) 1000 MG tablet TAKE ONE TABLET BY MOUTH TWICE DAILY WITH MEALS 60 tablet 2  . ONE TOUCH ULTRA TEST test strip 1 each by Other route 2 (two) times daily.     Letta Pate DELICA LANCETS 33G MISC 1 each 2 (two) times daily.      No current facility-administered medications for this visit.     Past Medical History  Diagnosis Date  . Coronary artery disease   . Diabetes mellitus 07/2014 dx    ROS:   All systems reviewed and negative except as noted in the HPI.   Past Surgical History  Procedure Laterality Date  . Coronary stent placement  1998     Family History  Problem Relation Age of Onset  . Diabetes Mother   . Diabetes Father   . Diabetes Other     nephew  . Diabetes Brother   . Diabetes Maternal Grandmother   . Stroke Father 36  . Diabetes Sister   . Clotting disorder Sister 85     History    Social History  . Marital Status: Married    Spouse Name: N/A  . Number of Children: N/A  . Years of Education: N/A   Occupational History  . Not on file.   Social History Main Topics  . Smoking status: Former Smoker -- 2.00 packs/day for 5 years    Types: Cigarettes    Quit date: 12/04/2000  . Smokeless tobacco: Not on file  . Alcohol Use: Yes     Comment: weekends  . Drug Use: Not on file  . Sexual Activity: Not on file   Other Topics Concern  . Not on file   Social History Narrative   Former smoker - quit 1998 after stent -    Lives with dtr and 2 g-sons   Separated from wife in 2005, good terms   Employed with [employer]     BP 130/80 mmHg  Pulse 76  Ht  (1.88 m)  Wt 237 lb 9.6 oz (107.775 kg)  BMI 30.49 kg/m2  Physical Exam:  Well appearing middle aged man, NAD HEENT: Unremarkable Neck: 6 cm JVD, no thyromegally Lungs:  Clear with no wheezes, rales, or rhonchi HEART:  Regular rate rhythm, no murmurs, no rubs, no clicks,soft S4 gallop Abd:  soft, positive bowel sounds, no organomegally, no rebound, no guarding Ext:  2 plus pulses, no edema, no cyanosis, no clubbing Skin:  No rashes no nodules Neuro:  CN II through XII intact, motor grossly intact  Assess/Plan:

## 2015-02-03 NOTE — Assessment & Plan Note (Signed)
He denies anginal symptoms. He will continue his current medications. He is encouraged to continue his regular daily exercise.

## 2015-02-03 NOTE — Assessment & Plan Note (Signed)
His symptoms remain class II. He is maintaining a low-sodium diet. He'll continue his current medications. He continues not to be interested in ICD implantation.

## 2015-02-03 NOTE — Assessment & Plan Note (Signed)
His blood pressure is now mostly controlled. He'll continue his current medications. He is encouraged to maintain a low-sodium diet.

## 2015-02-03 NOTE — Patient Instructions (Signed)
Your physician recommends that you continue on your current medications as directed. Please refer to the Current Medication list given to you today.  Your physician wants you to follow-up in: 1 year with Dr. Taylor.  You will receive a reminder letter in the mail two months in advance. If you don't receive a letter, please call our office to schedule the follow-up appointment.  

## 2015-02-14 ENCOUNTER — Other Ambulatory Visit: Payer: Self-pay | Admitting: Internal Medicine

## 2015-03-01 ENCOUNTER — Encounter: Payer: Self-pay | Admitting: Internal Medicine

## 2015-03-01 ENCOUNTER — Ambulatory Visit (INDEPENDENT_AMBULATORY_CARE_PROVIDER_SITE_OTHER): Payer: BLUE CROSS/BLUE SHIELD | Admitting: Internal Medicine

## 2015-03-01 ENCOUNTER — Other Ambulatory Visit (INDEPENDENT_AMBULATORY_CARE_PROVIDER_SITE_OTHER): Payer: BLUE CROSS/BLUE SHIELD

## 2015-03-01 VITALS — BP 122/72 | HR 64 | Temp 97.6°F | Ht 74.0 in | Wt 245.8 lb

## 2015-03-01 DIAGNOSIS — I5022 Chronic systolic (congestive) heart failure: Secondary | ICD-10-CM

## 2015-03-01 DIAGNOSIS — Z1159 Encounter for screening for other viral diseases: Secondary | ICD-10-CM

## 2015-03-01 DIAGNOSIS — I10 Essential (primary) hypertension: Secondary | ICD-10-CM | POA: Diagnosis not present

## 2015-03-01 DIAGNOSIS — Z23 Encounter for immunization: Secondary | ICD-10-CM

## 2015-03-01 DIAGNOSIS — Z1211 Encounter for screening for malignant neoplasm of colon: Secondary | ICD-10-CM | POA: Diagnosis not present

## 2015-03-01 DIAGNOSIS — E1159 Type 2 diabetes mellitus with other circulatory complications: Secondary | ICD-10-CM

## 2015-03-01 DIAGNOSIS — E1151 Type 2 diabetes mellitus with diabetic peripheral angiopathy without gangrene: Secondary | ICD-10-CM

## 2015-03-01 DIAGNOSIS — E1165 Type 2 diabetes mellitus with hyperglycemia: Principal | ICD-10-CM

## 2015-03-01 DIAGNOSIS — IMO0002 Reserved for concepts with insufficient information to code with codable children: Secondary | ICD-10-CM

## 2015-03-01 LAB — BASIC METABOLIC PANEL
BUN: 19 mg/dL (ref 6–23)
CHLORIDE: 104 meq/L (ref 96–112)
CO2: 25 mEq/L (ref 19–32)
Calcium: 9.1 mg/dL (ref 8.4–10.5)
Creatinine, Ser: 1.04 mg/dL (ref 0.40–1.50)
GFR: 93.55 mL/min (ref >60.00)
Glucose, Bld: 163 mg/dL — ABNORMAL HIGH (ref 70–99)
POTASSIUM: 4.3 meq/L (ref 3.5–5.1)
Sodium: 133 mEq/L — ABNORMAL LOW (ref 135–145)

## 2015-03-01 LAB — HEMOGLOBIN A1C: Hgb A1c MFr Bld: 7 % — ABNORMAL HIGH (ref 4.6–6.5)

## 2015-03-01 LAB — MICROALBUMIN / CREATININE URINE RATIO
Creatinine,U: 141.4 mg/dL
Microalb Creat Ratio: 0.4 mg/g (ref 0.0–30.0)
Microalb, Ur: 0.5 mg/dL (ref 0.0–1.9)

## 2015-03-01 LAB — LIPID PANEL
CHOLESTEROL: 219 mg/dL — AB (ref 0–200)
HDL: 51.8 mg/dL (ref >39.00)
LDL Cholesterol: 134 mg/dL — ABNORMAL HIGH (ref 0–99)
NonHDL: 167.2
Total CHOL/HDL Ratio: 4
Triglycerides: 164 mg/dL — ABNORMAL HIGH (ref 0.0–149.0)
VLDL: 32.8 mg/dL (ref 0.0–40.0)

## 2015-03-01 LAB — HEPATITIS C ANTIBODY: HCV Ab: NEGATIVE

## 2015-03-01 MED ORDER — GLIMEPIRIDE 2 MG PO TABS: 2.0000 mg | ORAL_TABLET | Freq: Every day | ORAL | Status: DC

## 2015-03-01 MED ORDER — METFORMIN HCL 1000 MG PO TABS: 1000.0000 mg | ORAL_TABLET | Freq: Two times a day (BID) | ORAL | Status: DC

## 2015-03-01 NOTE — Assessment & Plan Note (Signed)
Well compensated - no angina CAD hx with stent 1990s 2-D echocardiogram October 2015 with unchanged LVEF 20% follow up cards as ongoing, last office visit reviewed. Remains uninterested in ICD per Dr. Ladona Ridgel The current medical regimen is effective;  continue present plan and medications.

## 2015-03-01 NOTE — Progress Notes (Signed)
Subjective:    Patient ID: Karl [name], male    DOB: Apr 30, 1956, 59 y.o.   MRN: [id number]  HPI  Patient here for follow-up. Reviewed chronic medical issues, interval events and current concerns Check CBGs once daily and as needed. Reports compliance with medications as prescribed and denies hypoglycemia.  Past Medical History  Diagnosis Date  . Coronary artery disease   . Diabetes mellitus 07/2014 dx    Review of Systems  Constitutional: Negative for fatigue and unexpected weight change.  Respiratory: Negative for cough and shortness of breath.   Cardiovascular: Negative for chest pain and leg swelling.       Objective:    Physical Exam  Constitutional: He appears well-developed and well-nourished. No distress.  Cardiovascular: Normal rate, regular rhythm and normal heart sounds.   No murmur heard. Pulmonary/Chest: Effort normal and breath sounds normal. No respiratory distress.    BP 122/72 mmHg  Pulse 64  Temp(Src) 97.6 F (36.4 C) (Oral)  Ht 6\' 2"  (1.88 m)  Wt 245 lb 12 oz (111.471 kg)  BMI 31.54 kg/m2  SpO2 99% Wt Readings from Last 3 Encounters:  03/01/15 245 lb 12 oz (111.471 kg)  02/03/15 237 lb 9.6 oz (107.775 kg)  09/17/14 229 lb (103.874 kg)    Lab Results  Component Value Date   WBC 12.9* 09/06/2014   HGB 16.2 09/06/2014   HCT 47.0 09/06/2014   PLT 364 09/06/2014   GLUCOSE 209* 09/06/2014   CHOL 196 08/31/2014   TRIG 163.0* 08/31/2014   HDL 40.90 08/31/2014   LDLDIRECT 135.3 06/22/2008   LDLCALC 123* 08/31/2014   ALT 12 09/06/2014   AST 15 09/06/2014   NA 132* 09/06/2014   K 4.2 09/06/2014   CL 93* 09/06/2014   CREATININE 0.97 09/06/2014   BUN 15 09/06/2014   CO2 22 09/06/2014   TSH 0.98 08/31/2014   PSA 2.79 07/09/2014   HGBA1C 13.2* 07/09/2014   MICROALBUR 0.2 08/31/2014    No results found.     Assessment & Plan:   Problem List Items Addressed This Visit    Chronic systolic heart failure    Well compensated - no  angina CAD hx with stent 1990s 2-D echocardiogram October 2015 with unchanged LVEF 20% follow up cards as ongoing, last office visit reviewed. Remains uninterested in ICD per Dr. Ladona Ridgel The current medical regimen is effective;  continue present plan and medications.        DM (diabetes mellitus), type 2, uncontrolled, periph vascular complic - Primary    New dx 07/2014 - started on meds and eval by endo 09/2014 for same Reports med compliance - checks cbgs qAM and prn Denies hypoglycemia optho q 79mo The patient is asked to make an attempt to improve diet and exercise patterns to aid in medical management of this problem. Lab Results  Component Value Date   HGBA1C 13.2* 07/09/2014         Relevant Medications   glimepiride (AMARYL) tablet   metFORMIN (GLUCOPHAGE) tablet   Other Relevant Orders   Hemoglobin A1c   Basic metabolic panel   Lipid panel   Microalbumin / creatinine urine ratio   Essential hypertension    BP Readings from Last 3 Encounters:  03/01/15 122/72  02/03/15 130/80  09/17/14 118/72   The current medical regimen is effective;  continue present plan and medications.       Other Visit Diagnoses    Special screening for malignant neoplasms, colon  Relevant Orders    Ambulatory referral to Gastroenterology        Rene Paci, MD

## 2015-03-01 NOTE — Progress Notes (Signed)
Pre visit review using our clinic review tool, if applicable. No additional management support is needed unless otherwise documented below in the visit note. 

## 2015-03-01 NOTE — Assessment & Plan Note (Signed)
BP Readings from Last 3 Encounters:  03/01/15 122/72  02/03/15 130/80  09/17/14 118/72   The current medical regimen is effective;  continue present plan and medications.

## 2015-03-01 NOTE — Assessment & Plan Note (Signed)
New dx 07/2014 - started on meds and eval by endo 09/2014 for same Reports med compliance - checks cbgs qAM and prn Denies hypoglycemia optho q 74mo The patient is asked to make an attempt to improve diet and exercise patterns to aid in medical management of this problem. Lab Results  Component Value Date   HGBA1C 13.2* 07/09/2014

## 2015-03-01 NOTE — Patient Instructions (Addendum)
It was good to see you today.  We have reviewed your prior records including labs and tests today  Test(s) ordered today. Your results will be released to [place] [place] (or called to you) after review, usually within 72hours after test completion. If any changes need to be made, you will be notified at that same time.  Medications reviewed and updated, no changes recommended at this time.Refill on medication(s) as discussed today.  we'll make referral to gastroenterology for colon screening . Our office will contact you regarding appointment(s) once made.  Please schedule followup in 6 months, call sooner if problems.  Diabetes and Standards of Medical Care Diabetes is complicated. You may find that your diabetes team includes a dietitian, nurse, diabetes educator, eye doctor, and more. To help everyone know what is going on and to help you get the care you deserve, the following schedule of care was developed to help keep you on track. Below are the tests, exams, vaccines, medicines, education, and plans you will need. HbA1c test This test shows how well you have controlled your glucose over the past 2-3 months. It is used to see if your diabetes management plan needs to be adjusted.   It is performed at least 2 times a year if you are meeting treatment goals.  It is performed 4 times a year if therapy has changed or if you are not meeting treatment goals. Blood pressure test  This test is performed at every routine medical visit. The goal is less than 140/90 mm Hg for most people, but 130/80 mm Hg in some cases. Ask your health care provider about your goal. Dental exam  Follow up with the dentist regularly. Eye exam  If you are diagnosed with type 1 diabetes as a child, get an exam upon reaching the age of 62 years or older and have had diabetes for 3-5 years. Yearly eye exams are recommended after that initial eye exam.  If you are diagnosed with type 1 diabetes as an adult, get an exam  within 5 years of diagnosis and then yearly.  If you are diagnosed with type 2 diabetes, get an exam as soon as possible after the diagnosis and then yearly. Foot care exam  Visual foot exams are performed at every routine medical visit. The exams check for cuts, injuries, or other problems with the feet.  A comprehensive foot exam should be done yearly. This includes visual inspection as well as assessing foot pulses and testing for loss of sensation.  Check your feet nightly for cuts, injuries, or other problems with your feet. Tell your health care provider if anything is not healing. Kidney function test (urine microalbumin)  This test is performed once a year.  Type 1 diabetes: The first test is performed 5 years after diagnosis.  Type 2 diabetes: The first test is performed at the time of diagnosis.  A serum creatinine and estimated glomerular filtration rate (eGFR) test is done once a year to assess the level of chronic kidney disease (CKD), if present. Lipid profile (cholesterol, HDL, LDL, triglycerides)  Performed every 5 years for most people.  The goal for LDL is less than 100 mg/dL. If you are at high risk, the goal is less than 70 mg/dL.  The goal for HDL is 40 mg/dL-50 mg/dL for men and 50 mg/dL-60 mg/dL for women. An HDL cholesterol of 60 mg/dL or higher gives some protection against heart disease.  The goal for triglycerides is less than 150 mg/dL. Influenza vaccine,  pneumococcal vaccine, and hepatitis B vaccine  The influenza vaccine is recommended yearly.  It is recommended that people with diabetes who are over 2 years old get the pneumonia vaccine. In some cases, two separate shots may be given. Ask your health care provider if your pneumonia vaccination is up to date.  The hepatitis B vaccine is also recommended for adults with diabetes. Diabetes self-management education  Education is recommended at diagnosis and ongoing as needed. Treatment plan  Your  treatment plan is reviewed at every medical visit. Document Released: 09/17/2009 Document Revised: 04/06/2014 Document Reviewed: 04/22/2013 [employer] Patient Information 2015 [place], [place]. This information is not intended to replace advice given to you by your health care provider. Make sure you discuss any questions you have with your health care provider.

## 2015-03-03 ENCOUNTER — Telehealth: Payer: Self-pay | Admitting: Internal Medicine

## 2015-03-03 NOTE — Telephone Encounter (Signed)
Patient returned call

## 2015-03-03 NOTE — Telephone Encounter (Signed)
Called pt back. Dtr stated that pt was not available.   RE: Lab results. Letter has been mailed to address on file. [facility] can let pt know)

## 2015-03-12 ENCOUNTER — Telehealth: Payer: Self-pay | Admitting: Internal Medicine

## 2015-03-12 NOTE — Telephone Encounter (Signed)
He wants Viagra and I di not see it on his medication list.  I let him know I would ask Dr Ladona Ridgel next Tues and call him back

## 2015-03-12 NOTE — Telephone Encounter (Signed)
New message      Daughter said her father has been trying to call us.  She want [place] to call him at [phone].  She did not know what was wrong with him

## 2015-03-16 ENCOUNTER — Telehealth: Payer: Self-pay | Admitting: Internal Medicine

## 2015-03-16 MED ORDER — SILDENAFIL CITRATE 50 MG PO TABS: 50.0000 mg | ORAL_TABLET | Freq: Every day | ORAL | Status: DC | PRN

## 2015-03-16 MED ORDER — GLIMEPIRIDE 2 MG PO TABS: 2.0000 mg | ORAL_TABLET | Freq: Every day | ORAL | Status: DC

## 2015-03-16 MED ORDER — METFORMIN HCL 1000 MG PO TABS: 1000.0000 mg | ORAL_TABLET | Freq: Two times a day (BID) | ORAL | Status: DC

## 2015-03-16 NOTE — Telephone Encounter (Signed)
Discussed with Dr Rolley Sims to take Viagra 50 mg as needed

## 2015-03-16 NOTE — Telephone Encounter (Signed)
Patient need a prior Auth for medication Glimepiride and Metformin

## 2015-03-16 NOTE — Telephone Encounter (Signed)
Sending rx refill to see if this initiates a PA or a refill.

## 2015-03-25 ENCOUNTER — Ambulatory Visit (INDEPENDENT_AMBULATORY_CARE_PROVIDER_SITE_OTHER): Payer: BC Managed Care – PPO | Admitting: Ophthalmology

## 2015-03-26 ENCOUNTER — Ambulatory Visit (INDEPENDENT_AMBULATORY_CARE_PROVIDER_SITE_OTHER): Payer: BC Managed Care – PPO | Admitting: Ophthalmology

## 2015-03-31 ENCOUNTER — Ambulatory Visit (INDEPENDENT_AMBULATORY_CARE_PROVIDER_SITE_OTHER): Payer: No Typology Code available for payment source | Admitting: Ophthalmology

## 2015-03-31 DIAGNOSIS — E11329 Type 2 diabetes mellitus with mild nonproliferative diabetic retinopathy without macular edema: Secondary | ICD-10-CM | POA: Diagnosis not present

## 2015-03-31 DIAGNOSIS — H2513 Age-related nuclear cataract, bilateral: Secondary | ICD-10-CM

## 2015-03-31 DIAGNOSIS — H35033 Hypertensive retinopathy, bilateral: Secondary | ICD-10-CM

## 2015-03-31 DIAGNOSIS — H43813 Vitreous degeneration, bilateral: Secondary | ICD-10-CM

## 2015-03-31 DIAGNOSIS — H34812 Central retinal vein occlusion, left eye: Secondary | ICD-10-CM | POA: Diagnosis not present

## 2015-03-31 DIAGNOSIS — E11319 Type 2 diabetes mellitus with unspecified diabetic retinopathy without macular edema: Secondary | ICD-10-CM | POA: Diagnosis not present

## 2015-03-31 DIAGNOSIS — I10 Essential (primary) hypertension: Secondary | ICD-10-CM

## 2015-08-02 ENCOUNTER — Ambulatory Visit (INDEPENDENT_AMBULATORY_CARE_PROVIDER_SITE_OTHER): Payer: No Typology Code available for payment source | Admitting: Ophthalmology

## 2015-08-07 ENCOUNTER — Encounter (HOSPITAL_COMMUNITY): Payer: Self-pay | Admitting: Emergency Medicine

## 2015-08-07 ENCOUNTER — Emergency Department (HOSPITAL_COMMUNITY)
Admission: EM | Admit: 2015-08-07 | Discharge: 2015-08-07 | Disposition: A | Discharge status: Home / Self Care | Payer: Self-pay | Attending: Emergency Medicine | Admitting: Emergency Medicine

## 2015-08-07 DIAGNOSIS — R3915 Urgency of urination: Secondary | ICD-10-CM | POA: Insufficient documentation

## 2015-08-07 DIAGNOSIS — R112 Nausea with vomiting, unspecified: Secondary | ICD-10-CM | POA: Insufficient documentation

## 2015-08-07 DIAGNOSIS — E119 Type 2 diabetes mellitus without complications: Secondary | ICD-10-CM | POA: Insufficient documentation

## 2015-08-07 DIAGNOSIS — Z7982 Long term (current) use of aspirin: Secondary | ICD-10-CM | POA: Insufficient documentation

## 2015-08-07 DIAGNOSIS — R350 Frequency of micturition: Secondary | ICD-10-CM | POA: Insufficient documentation

## 2015-08-07 DIAGNOSIS — Z87891 Personal history of nicotine dependence: Secondary | ICD-10-CM | POA: Insufficient documentation

## 2015-08-07 DIAGNOSIS — R110 Nausea: Secondary | ICD-10-CM

## 2015-08-07 DIAGNOSIS — R300 Dysuria: Secondary | ICD-10-CM | POA: Insufficient documentation

## 2015-08-07 DIAGNOSIS — I2510 Atherosclerotic heart disease of native coronary artery without angina pectoris: Secondary | ICD-10-CM | POA: Insufficient documentation

## 2015-08-07 DIAGNOSIS — Z79899 Other long term (current) drug therapy: Secondary | ICD-10-CM | POA: Insufficient documentation

## 2015-08-07 DIAGNOSIS — R3911 Hesitancy of micturition: Secondary | ICD-10-CM | POA: Insufficient documentation

## 2015-08-07 LAB — CBC
HCT: 44.5 % (ref 39.0–52.0)
Hemoglobin: 14.9 g/dL (ref 13.0–17.0)
MCH: 26.4 pg (ref 26.0–34.0)
MCHC: 33.5 g/dL (ref 30.0–36.0)
MCV: 78.8 fL (ref 78.0–100.0)
PLATELETS: 298 10*3/uL (ref 150–400)
RBC: 5.65 MIL/uL (ref 4.22–5.81)
RDW: 14 % (ref 11.5–15.5)
WBC: 7.6 10*3/uL (ref 4.0–10.5)

## 2015-08-07 LAB — URINALYSIS, ROUTINE W REFLEX MICROSCOPIC
BILIRUBIN URINE: NEGATIVE
Glucose, UA: 250 mg/dL — AB
KETONES UR: NEGATIVE mg/dL
Leukocytes, UA: NEGATIVE
NITRITE: NEGATIVE
PROTEIN: NEGATIVE mg/dL
SPECIFIC GRAVITY, URINE: 1.015 (ref 1.005–1.030)
Urobilinogen, UA: 0.2 mg/dL (ref 0.0–1.0)
pH: 8 (ref 5.0–8.0)

## 2015-08-07 LAB — BASIC METABOLIC PANEL
Anion gap: 10 (ref 5–15)
BUN: 16 mg/dL (ref 6–20)
CALCIUM: 9.5 mg/dL (ref 8.9–10.3)
CO2: 23 mmol/L (ref 22–32)
CREATININE: 1 mg/dL (ref 0.61–1.24)
Chloride: 103 mmol/L (ref 101–111)
GFR calc Af Amer: >60 mL/min (ref >60)
GFR calc non Af Amer: >60 mL/min (ref >60)
GLUCOSE: 201 mg/dL — AB (ref 65–99)
POTASSIUM: 3.6 mmol/L (ref 3.5–5.1)
Sodium: 136 mmol/L (ref 135–145)

## 2015-08-07 LAB — URINE MICROSCOPIC-ADD ON

## 2015-08-07 MED ORDER — ONDANSETRON 8 MG PO TBDP: ORAL_TABLET | ORAL | Status: DC

## 2015-08-07 MED ORDER — ONDANSETRON HCL 4 MG/2ML IJ SOLN
4.0000 mg | Freq: Once | INTRAMUSCULAR | Status: AC
  Administered 2015-08-07: 4 mg via INTRAVENOUS
  Filled 2015-08-07: qty 2

## 2015-08-07 MED ORDER — TAMSULOSIN HCL 0.4 MG PO CAPS: 0.4000 mg | ORAL_CAPSULE | Freq: Two times a day (BID) | ORAL | Status: DC

## 2015-08-07 MED ORDER — TAMSULOSIN HCL 0.4 MG PO CAPS: 0.4000 mg | ORAL_CAPSULE | Freq: Every day | ORAL | Status: DC

## 2015-08-07 MED ORDER — SODIUM CHLORIDE 0.9 % IV BOLUS (SEPSIS)
250.0000 mL | Freq: Once | INTRAVENOUS | Status: AC
  Administered 2015-08-07: 250 mL via INTRAVENOUS

## 2015-08-07 NOTE — ED Notes (Signed)
Went to collect blood samples,RN advised she was starting an Iv and that they would collect the samples

## 2015-08-07 NOTE — ED Provider Notes (Signed)
CSN: [id number]     Arrival date & time 08/07/15  0174 History   First MD Initiated Contact with Patient 08/07/15 0845     Chief Complaint  Patient presents with  . Dysuria     (Consider location/radiation/quality/duration/timing/severity/associated sxs/prior Treatment) The history is provided by the patient and medical records. No language interpreter was used.     Karl [name] is a 59 y.o. male  with a hx of NIDDM, CAD presents to the Emergency Department complaining of gradual, persistent, progressively worsening urinary frequency, urgency and dysuria onset last night around 11pm.  Pt reports only a small amount of urine each time.  Patient reports that previously he has had some hesitancy with urination but not in the last several weeks or months. He denies a history of BPH. He denies associated fever, chills, testicular pain, penile pain, penile discharge, painful bowel movements, bloody bowel movements. Patient reports he is sexually active with one male partner for the last 30 years and has not had an STD during that time.  He reports that he has a small amount of lower abdominal discomfort when he is required to "put pressure on his abdomen to urinate."  He also endorses nausea and 2 episodes of vomiting today.  He denies recent travel, sick contacts.  Past Medical History  Diagnosis Date  . Coronary artery disease   . Diabetes mellitus 07/2014 dx   Past Surgical History  Procedure Laterality Date  . Coronary stent placement  1998   Family History  Problem Relation Age of Onset  . Diabetes Mother   . Diabetes Father   . Diabetes Other     nephew  . Diabetes Brother   . Diabetes Maternal Grandmother   . Stroke Father 15  . Diabetes Sister   . Clotting disorder Sister 53   Social History  Substance Use Topics  . Smoking status: Former Smoker -- 2.00 packs/day for 5 years    Types: Cigarettes    Quit date: 12/04/2000  . Smokeless tobacco: None  . Alcohol Use: Yes   Comment: weekends    Review of Systems  Constitutional: Negative for fever, diaphoresis, appetite change, fatigue and unexpected weight change.  HENT: Negative for mouth sores.   Eyes: Negative for visual disturbance.  Respiratory: Negative for cough, chest tightness, shortness of breath and wheezing.   Cardiovascular: Negative for chest pain.  Gastrointestinal: Negative for nausea, vomiting, abdominal pain, diarrhea and constipation.  Endocrine: Negative for polydipsia, polyphagia and polyuria.  Genitourinary: Positive for dysuria, urgency and frequency. Negative for hematuria.  Musculoskeletal: Negative for back pain and neck stiffness.  Skin: Negative for rash.  Allergic/Immunologic: Negative for immunocompromised state.  Neurological: Negative for syncope, light-headedness and headaches.  Hematological: Does not bruise/bleed easily.  Psychiatric/Behavioral: Negative for sleep disturbance. The patient is not nervous/anxious.       Allergies  Lipitor  Home Medications   Prior to Admission medications   Medication Sig Start Date End Date Taking? Authorizing Provider  acetaminophen (TYLENOL) 500 MG tablet Take 500 mg by mouth every 6 (six) hours as needed for pain.   Yes Historical Provider, MD  aspirin 325 MG tablet Take 1 tablet (325 mg total) by mouth daily. 02/03/15  Yes Marinus Maw, MD  carvedilol (COREG) 12.5 MG tablet Take 1 tablet (12.5 mg total) by mouth 2 (two) times daily. 02/03/15 02/03/16 Yes Marinus Maw, MD  enalapril (VASOTEC) 5 MG tablet Take 1 tablet (5 mg total) by mouth 2 (two)  times daily. 02/03/15  Yes Marinus Maw, MD  glimepiride (AMARYL) 2 MG tablet Take 1 tablet (2 mg total) by mouth daily with breakfast. 03/16/15  Yes Carlus Pavlov, MD  metFORMIN (GLUCOPHAGE) 1000 MG tablet Take 1 tablet (1,000 mg total) by mouth 2 (two) times daily with a meal. 03/16/15  Yes Carlus Pavlov, MD  sildenafil (VIAGRA) 50 MG tablet Take 1 tablet (50 mg total) by mouth daily  as needed for erectile dysfunction. 03/16/15  Yes Marinus Maw, MD  ondansetron (ZOFRAN ODT) 8 MG disintegrating tablet 8mg  ODT q4 hours prn nausea 08/07/15   Kaelea Gathright, PA-C  ONE TOUCH ULTRA TEST test strip 1 each by Other route 2 (two) times daily.  08/05/14   Historical Provider, MD  Dola Argyle LANCETS 33G MISC 1 each 2 (two) times daily.  08/07/14   Historical Provider, MD  tamsulosin (FLOMAX) 0.4 MG CAPS capsule Take 1 capsule (0.4 mg total) by mouth daily after supper. 08/07/15   Wilmary Levit, PA-C   BP 163/95 mmHg  Pulse 101  Temp(Src) 98.7 F (37.1 C) (Oral)  Resp 18  SpO2 99% Physical Exam  Constitutional: He appears well-developed and well-nourished. No distress.  Awake, alert, nontoxic appearance  HENT:  Head: Normocephalic and atraumatic.  Mouth/Throat: Oropharynx is clear and moist. No oropharyngeal exudate.  Eyes: Conjunctivae are normal. No scleral icterus.  Neck: Normal range of motion. Neck supple.  Cardiovascular: Normal rate, regular rhythm, normal heart sounds and intact distal pulses.   Pulmonary/Chest: Effort normal and breath sounds normal. No respiratory distress. He has no wheezes.  Equal chest expansion Clear and equal breath sounds  Abdominal: Soft. Bowel sounds are normal. He exhibits no distension and no mass. There is no tenderness. There is no rebound and no guarding. Hernia confirmed negative in the right inguinal area and confirmed negative in the left inguinal area.  Soft and nontender No CVA tenderness  Genitourinary: Testes normal and penis normal. Cremasteric reflex is present. Right testis shows no mass, no swelling and no tenderness. Right testis is descended. Cremasteric reflex is not absent on the right side. Left testis shows no mass, no swelling and no tenderness. Left testis is descended. Cremasteric reflex is not absent on the left side. Uncircumcised. No phimosis, paraphimosis, hypospadias, penile erythema or penile tenderness.  No discharge found.  Musculoskeletal: Normal range of motion. He exhibits no edema.  Lymphadenopathy:       Right: No inguinal adenopathy present.       Left: No inguinal adenopathy present.  Neurological: He is alert.  Speech is clear and goal oriented Moves extremities without ataxia  Skin: Skin is warm and dry. He is not diaphoretic.  Psychiatric: He has a normal mood and affect.  Nursing note and vitals reviewed.   ED Course  Procedures (including critical care time) Labs Review Labs Reviewed  BASIC METABOLIC PANEL - Abnormal; Notable for the following:    Glucose, Bld 201 (*)    All other components within normal limits  URINALYSIS, ROUTINE W REFLEX MICROSCOPIC (NOT AT [facility]) - Abnormal; Notable for the following:    Glucose, UA 250 (*)    Hgb urine dipstick TRACE (*)    All other components within normal limits  CBC  URINE MICROSCOPIC-ADD ON    Imaging Review No results found. I have personally reviewed and evaluated these images and lab results as part of my medical decision-making.   EKG Interpretation None      MDM   Final  diagnoses:  Dysuria  Urinary frequency  Urinary urgency  Urinary hesitancy  Nausea  Non-intractable vomiting with nausea, vomiting of unspecified type    Enrique Sack presents with urinary symptoms.  Bladder scan was 65 mL in his bladder. No evidence of urinary retention. Labs and urine pending.  10:15 AM Labs are reassuring.  Urinalysis without evidence of urinary tract infection.  Patient likely with BPH causing his symptoms.  Will give Zofran and reassess.  2:02 PM Pt with PO trial without difficulty.  NO further emesis here in the department.  Repeat abd exam remains soft and nontender.  Pt reports he is feeling better.  2 day f/u with PCP recommended and further evaluation by Urology.  BP 163/95 mmHg  Pulse 101  Temp(Src) 98.7 F (37.1 C) (Oral)  Resp 18  SpO2 99%  The patient was discussed with and seen by Dr. Littie Deeds who  agrees with the treatment plan.   Karl Client Chas Axel, PA-C 08/07/15 1404  Mirian Mo, MD 08/08/15 [phone]

## 2015-08-07 NOTE — ED Notes (Signed)
Per pt, states frequent and painful urination-states not a normal flow

## 2015-08-07 NOTE — ED Notes (Signed)
Karl [name], [place] aware of bladder scan results

## 2015-08-07 NOTE — Discharge Instructions (Signed)
1. Medications: zofran, flomax, usual home medications 2. Treatment: rest, drink plenty of fluids, advance diet slowly 3. Follow Up: Please followup with your primary doctor in 2 days for discussion of your diagnoses and further evaluation after today's visit; if you do not have a primary care doctor use the resource guide provided to find one; Please return to the ER for persistent vomiting, high fevers or worsening symptoms    Benign Prostatic Hyperplasia An enlarged prostate (benign prostatic hyperplasia) is common in older men. You may experience the following:  Weak urine stream.  Dribbling.  Feeling like the bladder has not emptied completely.  Difficulty starting urination.  Getting up frequently at night to urinate.  Urinating more frequently during the day. HOME CARE INSTRUCTIONS  Monitor your prostatic hyperplasia for any changes. The following actions may help to alleviate any discomfort you are experiencing:  Give yourself time when you urinate.  Stay away from alcohol.  Avoid beverages containing caffeine, such as coffee, tea, and colas, because they can make the problem worse.  Avoid decongestants, antihistamines, and some prescription medicines that can make the problem worse.  Follow up with your health care provider for further treatment as recommended. SEEK MEDICAL CARE IF:  You are experiencing progressive difficulty voiding.  Your urine stream is progressively getting narrower.  You are awaking from sleep with the urge to void more frequently.  You are constantly feeling the need to void.  You experience loss of urine, especially in small amounts. SEEK IMMEDIATE MEDICAL CARE IF:   You develop increased pain with urination or are unable to urinate.  You develop severe abdominal pain, vomiting, a high fever, or fainting.  You develop back pain or blood in your urine. MAKE SURE YOU:   Understand these instructions.  Will watch your  condition.  Will get help right away if you are not doing well or get worse. Document Released: 11/20/2005 Document Revised: 07/23/2013 Document Reviewed: 04/22/2013 [facility] Patient Information 2015 [place], [place]. This information is not intended to replace advice given to you by your health care provider. Make sure you discuss any questions you have with your health care provider.

## 2015-08-07 NOTE — ED Notes (Signed)
MD at bedside. 

## 2015-08-07 NOTE — ED Notes (Signed)
Pt given diet ginger ale and crackers as per orders. Pt states he does feel some better.

## 2015-09-01 ENCOUNTER — Other Ambulatory Visit (INDEPENDENT_AMBULATORY_CARE_PROVIDER_SITE_OTHER): Payer: PRIVATE HEALTH INSURANCE

## 2015-09-01 ENCOUNTER — Encounter: Payer: Self-pay | Admitting: Internal Medicine

## 2015-09-01 ENCOUNTER — Ambulatory Visit (INDEPENDENT_AMBULATORY_CARE_PROVIDER_SITE_OTHER): Payer: PRIVATE HEALTH INSURANCE | Admitting: Internal Medicine

## 2015-09-01 VITALS — BP 112/70 | HR 69 | Temp 98.2°F | Ht 74.0 in | Wt 236.1 lb

## 2015-09-01 DIAGNOSIS — N400 Enlarged prostate without lower urinary tract symptoms: Secondary | ICD-10-CM | POA: Diagnosis not present

## 2015-09-01 DIAGNOSIS — E1151 Type 2 diabetes mellitus with diabetic peripheral angiopathy without gangrene: Secondary | ICD-10-CM

## 2015-09-01 DIAGNOSIS — E785 Hyperlipidemia, unspecified: Secondary | ICD-10-CM | POA: Diagnosis not present

## 2015-09-01 DIAGNOSIS — E1159 Type 2 diabetes mellitus with other circulatory complications: Secondary | ICD-10-CM

## 2015-09-01 DIAGNOSIS — E1165 Type 2 diabetes mellitus with hyperglycemia: Principal | ICD-10-CM

## 2015-09-01 DIAGNOSIS — I5022 Chronic systolic (congestive) heart failure: Secondary | ICD-10-CM | POA: Diagnosis not present

## 2015-09-01 DIAGNOSIS — IMO0002 Reserved for concepts with insufficient information to code with codable children: Secondary | ICD-10-CM

## 2015-09-01 DIAGNOSIS — E1169 Type 2 diabetes mellitus with other specified complication: Secondary | ICD-10-CM | POA: Insufficient documentation

## 2015-09-01 DIAGNOSIS — Z23 Encounter for immunization: Secondary | ICD-10-CM | POA: Diagnosis not present

## 2015-09-01 LAB — HEMOGLOBIN A1C: Hgb A1c MFr Bld: 6.8 % — ABNORMAL HIGH (ref 4.6–6.5)

## 2015-09-01 LAB — PSA: PSA: 2.91 ng/mL (ref 0.10–4.00)

## 2015-09-01 MED ORDER — PRAVASTATIN SODIUM 20 MG PO TABS: 20.0000 mg | ORAL_TABLET | Freq: Every day | ORAL | Status: DC

## 2015-09-01 MED ORDER — TAMSULOSIN HCL 0.4 MG PO CAPS: 0.4000 mg | ORAL_CAPSULE | Freq: Every day | ORAL | Status: DC

## 2015-09-01 NOTE — Assessment & Plan Note (Signed)
New dx 07/2014 - started on meds and eval by endo 09/2014 for same Reports med compliance - checks cbgs qAM and prn - reports avg 140-150 Denies hypoglycemia optho q 36mo The patient is asked to make an attempt to improve diet and exercise patterns to aid in medical management of this problem. Lab Results  Component Value Date   HGBA1C 7.0* 03/01/2015

## 2015-09-01 NOTE — Progress Notes (Signed)
Pre visit review using our clinic review tool, if applicable. No additional management support is needed unless otherwise documented below in the visit note. 

## 2015-09-01 NOTE — Patient Instructions (Signed)
It was good to see you today.  We have reviewed your prior records including labs and tests today  Annual flu shot updated today  Test(s) ordered today. Your results will be released to MyChart (or called to you) after review, usually within 72hours after test completion. If any changes need to be made, you will be notified at that same time.  Medications reviewed and updated Continue generic Flomax for bladder and begin low-dose pravastatin for treatment of cholesterol as discussed. No other prescription changes recommended at this time.  Please schedule followup in 6 months, call sooner if problems.

## 2015-09-01 NOTE — Assessment & Plan Note (Signed)
Well compensated - no angina or volume overload CAD hx with stent 1990s 2-D echocardiogram October 2015 with unchanged LVEF 20% follow up annually with cards as ongoing, last office visit reviewed. Remains uninterested in ICD per Dr. Ladona Ridgel The current medical regimen is effective;  continue present plan and medications.

## 2015-09-01 NOTE — Progress Notes (Signed)
Subjective:    Patient ID: Karl [name], male    DOB: 1955-12-18, 59 y.o.   MRN: [id number]  HPI  Patient here for follow up - reviewed chronic medical conditions, interval events and current concerns   Past Medical History  Diagnosis Date  . Coronary artery disease   . Diabetes mellitus 07/2014 dx    Review of Systems  Constitutional: Negative for fever, fatigue and unexpected weight change.  Respiratory: Negative for cough and shortness of breath.   Cardiovascular: Negative for chest pain, palpitations and leg swelling.  Genitourinary: Positive for difficulty urinating (ED eval 9/3 for BPH).       Objective:    Physical Exam  Constitutional: He appears well-developed and well-nourished. No distress.  Cardiovascular: Normal rate, regular rhythm and normal heart sounds.   No murmur heard. Pulmonary/Chest: Effort normal and breath sounds normal. No respiratory distress.    BP 112/70 mmHg  Pulse 69  Temp(Src) 98.2 F (36.8 C) (Oral)  Ht 6\' 2"  (1.88 m)  Wt 236 lb 2 oz (107.106 kg)  BMI 30.30 kg/m2  SpO2 97% Wt Readings from Last 3 Encounters:  09/01/15 236 lb 2 oz (107.106 kg)  03/01/15 245 lb 12 oz (111.471 kg)  02/03/15 237 lb 9.6 oz (107.775 kg)     Lab Results  Component Value Date   WBC 7.6 08/07/2015   HGB 14.9 08/07/2015   HCT 44.5 08/07/2015   PLT 298 08/07/2015   GLUCOSE 201* 08/07/2015   CHOL 219* 03/01/2015   TRIG 164.0* 03/01/2015   HDL 51.80 03/01/2015   LDLDIRECT 135.3 06/22/2008   LDLCALC 134* 03/01/2015   ALT 12 09/06/2014   AST 15 09/06/2014   NA 136 08/07/2015   K 3.6 08/07/2015   CL 103 08/07/2015   CREATININE 1.00 08/07/2015   BUN 16 08/07/2015   CO2 23 08/07/2015   TSH 0.98 08/31/2014   PSA 2.79 07/09/2014   HGBA1C 7.0* 03/01/2015   MICROALBUR 0.5 03/01/2015    No results found.     Assessment & Plan:   Problem List Items Addressed This Visit    BPH without urinary obstruction    Emergency department evaluation for  same early September 2016 reviewed Evaluation unremarkable for infection or urinary retention Symptoms improved with 2 weeks of Fosamax, slight increasing symptoms since discontinuation of same We'll resume daily Flomax and check PSA - refer to uro if continued problems      Relevant Medications   tamsulosin (FLOMAX) 0.4 MG CAPS capsule   Other Relevant Orders   PSA   Chronic systolic heart failure    Well compensated - no angina or volume overload CAD hx with stent 1990s 2-D echocardiogram October 2015 with unchanged LVEF 20% follow up annually with cards as ongoing, last office visit reviewed. Remains uninterested in ICD per Dr. Ladona Ridgel The current medical regimen is effective;  continue present plan and medications.      Relevant Medications   pravastatin (PRAVACHOL) 20 MG tablet   DM (diabetes mellitus), type 2, uncontrolled, periph vascular complic - Primary    New dx 07/2014 - started on meds and eval by endo 09/2014 for same Reports med compliance - checks cbgs qAM and prn - reports avg 140-150 Denies hypoglycemia optho q 71mo The patient is asked to make an attempt to improve diet and exercise patterns to aid in medical management of this problem. Lab Results  Component Value Date   HGBA1C 7.0* 03/01/2015  Relevant Medications   pravastatin (PRAVACHOL) 20 MG tablet   Other Relevant Orders   Hemoglobin A1c   Dyslipidemia    Previously intolerant of atorva trial by cards due to muscle ache and GI upset After discussion on medical benefit of statin treatment given CAD and diabetes, patient agrees to trial of new statin Pravachol 20 mg once daily sent pharmacy we reviewed potential risk/benefit and possible side effects - pt understands and agrees to same -will notify us if unable to tolerate same      Relevant Medications   pravastatin (PRAVACHOL) 20 MG tablet    Other Visit Diagnoses    Need for prophylactic vaccination and inoculation against influenza         Relevant Orders    Flu Vaccine QUAD 36+ mos IM        Rene Paci, MD

## 2015-09-01 NOTE — Assessment & Plan Note (Signed)
Emergency department evaluation for same early September 2016 reviewed Evaluation unremarkable for infection or urinary retention Symptoms improved with 2 weeks of Fosamax, slight increasing symptoms since discontinuation of same We'll resume daily Flomax and check PSA - refer to uro if continued problems

## 2015-09-01 NOTE — Assessment & Plan Note (Signed)
Previously intolerant of atorva trial by cards due to muscle ache and GI upset After discussion on medical benefit of statin treatment given CAD and diabetes, patient agrees to trial of new statin Pravachol 20 mg once daily sent pharmacy we reviewed potential risk/benefit and possible side effects - pt understands and agrees to same -will notify us if unable to tolerate same

## 2016-02-18 ENCOUNTER — Other Ambulatory Visit: Payer: Self-pay | Admitting: Internal Medicine

## 2016-03-07 ENCOUNTER — Encounter: Payer: Self-pay | Admitting: Internal Medicine

## 2016-03-07 ENCOUNTER — Ambulatory Visit (INDEPENDENT_AMBULATORY_CARE_PROVIDER_SITE_OTHER): Payer: PRIVATE HEALTH INSURANCE | Admitting: Internal Medicine

## 2016-03-07 VITALS — BP 148/78 | HR 75 | Ht 74.5 in | Wt 243.6 lb

## 2016-03-07 DIAGNOSIS — I255 Ischemic cardiomyopathy: Secondary | ICD-10-CM

## 2016-03-07 MED ORDER — CARVEDILOL 12.5 MG PO TABS: 18.7500 mg | ORAL_TABLET | Freq: Two times a day (BID) | ORAL | Status: DC

## 2016-03-07 NOTE — Patient Instructions (Signed)
Medication Instructions:  Your physician has recommended you make the following change in your medication:  1) Increase Carvedilol to 1 1/2 tablets twice daily   Labwork: None ordered   Testing/Procedures: None ordered   Follow-Up: Your physician wants you to follow-up in: 12 months with Dr Court Joy will receive a reminder letter in the mail two months in advance. If you don't receive a letter, please call our office to schedule the follow-up appointment.   Any Other Special Instructions Will Be Listed Below (If Applicable).     If you need a refill on your cardiac medications before your next appointment, please call your pharmacy.

## 2016-03-07 NOTE — Progress Notes (Signed)
HPI Mr. Gilberg returns today for CHF followup. He is a pleasant 60 yo man with a h/o chronic systolic heart failure, HTN, and remote substance abuse. Since then he has been stable. His blood pressure is much improved. He denies chest pain, shortness of breath, or syncope. He denies medical or dietary non-compliance.  Allergies  Allergen Reactions  . Lipitor [Atorvastatin] Nausea And Vomiting     Current Outpatient Prescriptions  Medication Sig Dispense Refill  . acetaminophen (TYLENOL) 500 MG tablet Take 500 mg by mouth every 6 (six) hours as needed for pain.    Marland Kitchen aspirin 325 MG tablet Take 1 tablet (325 mg total) by mouth daily. 30 tablet 11  . enalapril (VASOTEC) 5 MG tablet TAKE ONE TABLET BY MOUTH TWICE DAILY 60 tablet 0  . glimepiride (AMARYL) 2 MG tablet Take 1 tablet (2 mg total) by mouth daily with breakfast. 90 tablet 0  . metFORMIN (GLUCOPHAGE) 1000 MG tablet Take 1 tablet (1,000 mg total) by mouth 2 (two) times daily with a meal. 180 tablet 0  . ONE TOUCH ULTRA TEST test strip 1 each by Other route 2 (two) times daily.     Letta Pate DELICA LANCETS 33G MISC 1 each 2 (two) times daily.     . pravastatin (PRAVACHOL) 20 MG tablet Take 1 tablet (20 mg total) by mouth daily. 90 tablet 3  . sildenafil (VIAGRA) 50 MG tablet Take 1 tablet (50 mg total) by mouth daily as needed for erectile dysfunction. 10 tablet 1  . tamsulosin (FLOMAX) 0.4 MG CAPS capsule Take 1 capsule (0.4 mg total) by mouth daily after supper. 30 capsule 5  . carvedilol (COREG) 12.5 MG tablet Take 1.5 tablets (18.75 mg total) by mouth 2 (two) times daily. 270 tablet 3   No current facility-administered medications for this visit.     Past Medical History  Diagnosis Date  . Coronary artery disease   . Diabetes mellitus (HCC) 07/2014 dx    ROS:   All systems reviewed and negative except as noted in the HPI.   Past Surgical History  Procedure Laterality Date  . Coronary stent placement  1998     Family  History  Problem Relation Age of Onset  . Diabetes Mother   . Diabetes Father   . Diabetes Other     nephew  . Diabetes Brother   . Diabetes Maternal Grandmother   . Stroke Father 81  . Diabetes Sister   . Clotting disorder Sister 26     Social History   Social History  . Marital Status: Married    Spouse Name: N/A  . Number of Children: N/A  . Years of Education: N/A   Occupational History  . Not on file.   Social History Main Topics  . Smoking status: Former Smoker -- 2.00 packs/day for 5 years    Types: Cigarettes    Quit date: 12/04/2000  . Smokeless tobacco: Not on file  . Alcohol Use: Yes     Comment: weekends  . Drug Use: Not on file  . Sexual Activity: Not on file   Other Topics Concern  . Not on file   Social History Narrative   Former smoker - quit 1998 after stent -    Lives with dtr and 2 g-sons   Separated from wife in 2005, good terms   Employed with [employer]     BP 148/78 mmHg  Pulse 75  Ht 6' 2.5" (1.892 m)  [employer]  243 lb 9.6 oz (110.496 kg)  BMI 30.87 kg/m2  Physical Exam:  Well appearing middle aged man, NAD HEENT: Unremarkable Neck: 6 cm JVD, no thyromegally Lungs:  Clear with no wheezes, rales, or rhonchi HEART:  Regular rate rhythm, no murmurs, no rubs, no clicks,soft S4 gallop Abd:  soft, positive bowel sounds, no organomegally, no rebound, no guarding Ext:  2 plus pulses, no edema, no cyanosis, no clubbing Skin:  No rashes no nodules Neuro:  CN II through XII intact, motor grossly intact  ECG - NSR with LBBB/IVCD  Assess/Plan:  1. Non-ischemic CM - his chronic systolic heart failure appears to be well compensated. He will continue his current meds. 2. HTN - his blood pressure is elevated today but he notes that it has been better controlled. I have asked that he uptitrate his coreg. 3. 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 Will follow.  Leonia Reeves.D.

## 2016-03-21 ENCOUNTER — Other Ambulatory Visit: Payer: Self-pay | Admitting: Internal Medicine

## 2016-03-22 NOTE — Telephone Encounter (Signed)
Called pt and schedule to have OV with new PCP.

## 2016-04-02 ENCOUNTER — Other Ambulatory Visit: Payer: Self-pay | Admitting: Internal Medicine

## 2016-04-03 ENCOUNTER — Telehealth: Payer: Self-pay | Admitting: Internal Medicine

## 2016-04-03 MED ORDER — METFORMIN HCL 1000 MG PO TABS
1000.0000 mg | ORAL_TABLET | Freq: Two times a day (BID) | ORAL | Status: DC
Start: 2016-04-03 — End: 2016-04-21

## 2016-04-03 NOTE — Telephone Encounter (Signed)
Pt daughter called in said that he needs a refill on his glimepiride (AMARYL) 2 MG tablet [[id number]].  Can he get a refill on this til the 15th of may, he is coming in to est with Raford Pitcher on [address]

## 2016-04-04 MED ORDER — GLIMEPIRIDE 2 MG PO TABS: 2.0000 mg | ORAL_TABLET | Freq: Every day | ORAL | Status: DC

## 2016-04-04 NOTE — Telephone Encounter (Signed)
Received call daughter states her father called her and told her the rx that was sent in yesterday was not the correct medication. She states he is needing the Glimepride. Per chart assistant sent in metformin inform daughter will send glimepiride to [facility]...Raechel Chute

## 2016-04-04 NOTE — Addendum Note (Signed)
Addended by: Deatra James on: 04/04/2016 04:31 PM   Modules accepted: Orders

## 2016-04-21 ENCOUNTER — Encounter: Payer: Self-pay | Admitting: Internal Medicine

## 2016-04-21 ENCOUNTER — Ambulatory Visit (INDEPENDENT_AMBULATORY_CARE_PROVIDER_SITE_OTHER): Payer: No Typology Code available for payment source | Admitting: Internal Medicine

## 2016-04-21 ENCOUNTER — Other Ambulatory Visit (INDEPENDENT_AMBULATORY_CARE_PROVIDER_SITE_OTHER): Payer: No Typology Code available for payment source

## 2016-04-21 VITALS — BP 110/84 | HR 67 | Temp 97.9°F | Ht 74.5 in | Wt 238.5 lb

## 2016-04-21 DIAGNOSIS — Z114 Encounter for screening for human immunodeficiency virus [HIV]: Secondary | ICD-10-CM

## 2016-04-21 DIAGNOSIS — IMO0002 Reserved for concepts with insufficient information to code with codable children: Secondary | ICD-10-CM

## 2016-04-21 DIAGNOSIS — E1151 Type 2 diabetes mellitus with diabetic peripheral angiopathy without gangrene: Secondary | ICD-10-CM

## 2016-04-21 DIAGNOSIS — E1165 Type 2 diabetes mellitus with hyperglycemia: Secondary | ICD-10-CM

## 2016-04-21 DIAGNOSIS — I10 Essential (primary) hypertension: Secondary | ICD-10-CM

## 2016-04-21 DIAGNOSIS — Z1211 Encounter for screening for malignant neoplasm of colon: Secondary | ICD-10-CM

## 2016-04-21 DIAGNOSIS — N400 Enlarged prostate without lower urinary tract symptoms: Secondary | ICD-10-CM

## 2016-04-21 DIAGNOSIS — E785 Hyperlipidemia, unspecified: Secondary | ICD-10-CM | POA: Diagnosis not present

## 2016-04-21 DIAGNOSIS — Z0000 Encounter for general adult medical examination without abnormal findings: Secondary | ICD-10-CM

## 2016-04-21 DIAGNOSIS — Z23 Encounter for immunization: Secondary | ICD-10-CM | POA: Diagnosis not present

## 2016-04-21 DIAGNOSIS — Z125 Encounter for screening for malignant neoplasm of prostate: Secondary | ICD-10-CM

## 2016-04-21 LAB — LIPID PANEL
CHOL/HDL RATIO: 5
Cholesterol: 205 mg/dL — ABNORMAL HIGH (ref 0–200)
HDL: 43.9 mg/dL (ref >39.00)
LDL Cholesterol: 137 mg/dL — ABNORMAL HIGH (ref 0–99)
NONHDL: 161.26
Triglycerides: 120 mg/dL (ref 0.0–149.0)
VLDL: 24 mg/dL (ref 0.0–40.0)

## 2016-04-21 LAB — CBC WITH DIFFERENTIAL/PLATELET
BASOS ABS: 0 10*3/uL (ref 0.0–0.1)
Basophils Relative: 0.3 % (ref 0.0–3.0)
Eosinophils Absolute: 0.2 10*3/uL (ref 0.0–0.7)
Eosinophils Relative: 2.1 % (ref 0.0–5.0)
HCT: 41.3 % (ref 39.0–52.0)
Hemoglobin: 13.6 g/dL (ref 13.0–17.0)
LYMPHS ABS: 1.6 10*3/uL (ref 0.7–4.0)
LYMPHS PCT: 18.2 % (ref 12.0–46.0)
MCHC: 33 g/dL (ref 30.0–36.0)
MCV: 77.9 fl — AB (ref 78.0–100.0)
MONOS PCT: 11.3 % (ref 3.0–12.0)
Monocytes Absolute: 1 10*3/uL (ref 0.1–1.0)
NEUTROS PCT: 68.1 % (ref 43.0–77.0)
Neutro Abs: 6.2 10*3/uL (ref 1.4–7.7)
Platelets: 278 10*3/uL (ref 150.0–400.0)
RBC: 5.3 Mil/uL (ref 4.22–5.81)
RDW: 14 % (ref 11.5–15.5)
WBC: 9 10*3/uL (ref 4.0–10.5)

## 2016-04-21 LAB — COMPREHENSIVE METABOLIC PANEL
ALK PHOS: 57 U/L (ref 39–117)
ALT: 10 U/L (ref 0–53)
AST: 12 U/L (ref 0–37)
Albumin: 4 g/dL (ref 3.5–5.2)
BILIRUBIN TOTAL: 0.6 mg/dL (ref 0.2–1.2)
BUN: 14 mg/dL (ref 6–23)
CO2: 25 mEq/L (ref 19–32)
CREATININE: 1 mg/dL (ref 0.40–1.50)
Calcium: 9.4 mg/dL (ref 8.4–10.5)
Chloride: 102 mEq/L (ref 96–112)
GFR: 97.51 mL/min (ref >60.00)
GLUCOSE: 107 mg/dL — AB (ref 70–99)
Potassium: 3.9 mEq/L (ref 3.5–5.1)
SODIUM: 134 meq/L — AB (ref 135–145)
TOTAL PROTEIN: 7.3 g/dL (ref 6.0–8.3)

## 2016-04-21 LAB — HIV ANTIBODY (ROUTINE TESTING W REFLEX): HIV: NONREACTIVE

## 2016-04-21 LAB — TSH: TSH: 0.61 u[IU]/mL (ref 0.35–4.50)

## 2016-04-21 LAB — MICROALBUMIN / CREATININE URINE RATIO
Creatinine,U: 98.5 mg/dL
Microalb Creat Ratio: 0.7 mg/g (ref 0.0–30.0)

## 2016-04-21 LAB — HEMOGLOBIN A1C: Hgb A1c MFr Bld: 7.1 % — ABNORMAL HIGH (ref 4.6–6.5)

## 2016-04-21 MED ORDER — PRAVASTATIN SODIUM 20 MG PO TABS: 20.0000 mg | ORAL_TABLET | Freq: Every day | ORAL | Status: DC

## 2016-04-21 MED ORDER — GLIMEPIRIDE 2 MG PO TABS: 2.0000 mg | ORAL_TABLET | Freq: Every day | ORAL | Status: DC

## 2016-04-21 MED ORDER — METFORMIN HCL 1000 MG PO TABS: 1000.0000 mg | ORAL_TABLET | Freq: Two times a day (BID) | ORAL | Status: DC

## 2016-04-21 MED ORDER — ENALAPRIL MALEATE 5 MG PO TABS: 5.0000 mg | ORAL_TABLET | Freq: Two times a day (BID) | ORAL | Status: DC

## 2016-04-21 MED ORDER — TAMSULOSIN HCL 0.4 MG PO CAPS: 0.4000 mg | ORAL_CAPSULE | Freq: Every day | ORAL | Status: DC

## 2016-04-21 NOTE — Assessment & Plan Note (Signed)
Taking pravastatin 20 mg daily Check lipid panel

## 2016-04-21 NOTE — Assessment & Plan Note (Signed)
Lab Results  Component Value Date   HGBA1C 6.8* 09/01/2015   Well controlled Check a1c Eye exam up to date, checks feet Continue current dose of medication Follow-up in 6 months

## 2016-04-21 NOTE — Addendum Note (Signed)
Addended by: Radford Pax M on: 04/21/2016 04:08 PM   Modules accepted: Orders, SmartSet

## 2016-04-21 NOTE — Patient Instructions (Addendum)
For your allergies a daily anti-histamine, such as claritin, allegra or zyrtec.  You can also try a nasal spray, such as flonase once a day.    Test(s) ordered today. Your results will be released to MyChart (or called to you) after review, usually within 72hours after test completion. If any changes need to be made, you will be notified at that same time.  All other Health Maintenance issues reviewed.   All recommended immunizations and age-appropriate screenings are up-to-date or discussed.  Shingles vaccine administered today.   Medications reviewed and updated.  No changes recommended at this time.  Your prescription(s) have been submitted to your pharmacy. Please take as directed and contact our office if you believe you are having problem(s) with the medication(s).   Please followup in 6 months  Health Maintenance, Male A healthy lifestyle and preventative care can promote health and wellness.  Maintain regular health, dental, and eye exams.  Eat a healthy diet. Foods like vegetables, fruits, whole grains, low-fat dairy products, and lean protein foods contain the nutrients you need and are low in calories. Decrease your intake of foods high in solid fats, added sugars, and salt. Get information about a proper diet from your health care provider, if necessary.  Regular physical exercise is one of the most important things you can do for your health. Most adults should get at least 150 minutes of moderate-intensity exercise (any activity that increases your heart rate and causes you to sweat) each week. In addition, most adults need muscle-strengthening exercises on 2 or more days a week.   Maintain a healthy weight. The body mass index (BMI) is a screening tool to identify possible weight problems. It provides an estimate of body fat based on height and weight. Your health care provider can find your BMI and can help you achieve or maintain a healthy weight. For males 20 years and  older:  A BMI below 18.5 is considered underweight.  A BMI of 18.5 to 24.9 is normal.  A BMI of 25 to 29.9 is considered overweight.  A BMI of 30 and above is considered obese.  Maintain normal blood lipids and cholesterol by exercising and minimizing your intake of saturated fat. Eat a balanced diet with plenty of fruits and vegetables. Blood tests for lipids and cholesterol should begin at age 52 and be repeated every 5 years. If your lipid or cholesterol levels are high, you are over age 73, or you are at high risk for heart disease, you may need your cholesterol levels checked more frequently.Ongoing high lipid and cholesterol levels should be treated with medicines if diet and exercise are not working.  If you smoke, find out from your health care provider how to quit. If you do not use tobacco, do not start.  Lung cancer screening is recommended for adults aged 55-80 years who are at high risk for developing lung cancer because of a history of smoking. A yearly low-dose CT scan of the lungs is recommended for people who have at least a 30-pack-year history of smoking and are current smokers or have quit within the past 15 years. A pack year of smoking is smoking an average of 1 pack of cigarettes a day for 1 year (for example, a 30-pack-year history of smoking could mean smoking 1 pack a day for 30 years or 2 packs a day for 15 years). Yearly screening should continue until the smoker has stopped smoking for at least 15 years. Yearly screening should be  stopped for people who develop a health problem that would prevent them from having lung cancer treatment.  If you choose to drink alcohol, do not have more than 2 drinks per day. One drink is considered to be 12 oz (360 mL) of beer, 5 oz (150 mL) of wine, or 1.5 oz (45 mL) of liquor.  Avoid the use of street drugs. Do not share needles with anyone. Ask for help if you need support or instructions about stopping the use of drugs.  High  blood pressure causes heart disease and increases the risk of stroke. High blood pressure is more likely to develop in:  People who have blood pressure in the end of the normal range (100-139/85-89 mm Hg).  People who are overweight or obese.  People who are African American.  If you are 76-29 years of age, have your blood pressure checked every 3-5 years. If you are 52 years of age or older, have your blood pressure checked every year. You should have your blood pressure measured twice--once when you are at a hospital or clinic, and once when you are not at a hospital or clinic. Record the average of the two measurements. To check your blood pressure when you are not at a hospital or clinic, you can use:  An automated blood pressure machine at a pharmacy.  A home blood pressure monitor.  If you are 38-48 years old, ask your health care provider if you should take aspirin to prevent heart disease.  Diabetes screening involves taking a blood sample to check your fasting blood sugar level. This should be done once every 3 years after age 65 if you are at a normal weight and without risk factors for diabetes. Testing should be considered at a younger age or be carried out more frequently if you are overweight and have at least 1 risk factor for diabetes.  Colorectal cancer can be detected and often prevented. Most routine colorectal cancer screening begins at the age of 63 and continues through age 60. However, your health care provider may recommend screening at an earlier age if you have risk factors for colon cancer. On a yearly basis, your health care provider may provide home test kits to check for hidden blood in the stool. A small camera at the end of a tube may be used to directly examine the colon (sigmoidoscopy or colonoscopy) to detect the earliest forms of colorectal cancer. Talk to your health care provider about this at age 79 when routine screening begins. A direct exam of the colon  should be repeated every 5-10 years through age 15, unless early forms of precancerous polyps or small growths are found.  People who are at an increased risk for hepatitis B should be screened for this virus. You are considered at high risk for hepatitis B if:  You were born in a country where hepatitis B occurs often. Talk with your health care provider about which countries are considered high risk.  Your parents were born in a high-risk country and you have not received a shot to protect against hepatitis B (hepatitis B vaccine).  You have HIV or AIDS.  You use needles to inject street drugs.  You live with, or have sex with, someone who has hepatitis B.  You are a man who has sex with other men (MSM).  You get hemodialysis treatment.  You take certain medicines for conditions like cancer, organ transplantation, and autoimmune conditions.  Hepatitis C blood testing is recommended  for all people born from [birth year] through 1967 and any individual with known risk factors for hepatitis C.  Healthy men should no longer receive prostate-specific antigen (PSA) blood tests as part of routine cancer screening. Talk to your health care provider about prostate cancer screening.  Testicular cancer screening is not recommended for adolescents or adult males who have no symptoms. Screening includes self-exam, a health care provider exam, and other screening tests. Consult with your health care provider about any symptoms you have or any concerns you have about testicular cancer.  Practice safe sex. Use condoms and avoid high-risk sexual practices to reduce the spread of sexually transmitted infections (STIs).  You should be screened for STIs, including gonorrhea and chlamydia if:  You are sexually active and are younger than 24 years.  You are older than 24 years, and your health care provider tells you that you are at risk for this type of infection.  Your sexual activity has changed since you  were last screened, and you are at an increased risk for chlamydia or gonorrhea. Ask your health care provider if you are at risk.  If you are at risk of being infected with HIV, it is recommended that you take a prescription medicine daily to prevent HIV infection. This is called pre-exposure prophylaxis (PrEP). You are considered at risk if:  You are a man who has sex with other men (MSM).  You are a heterosexual man who is sexually active with multiple partners.  You take drugs by injection.  You are sexually active with a partner who has HIV.  Talk with your health care provider about whether you are at high risk of being infected with HIV. If you choose to begin PrEP, you should first be tested for HIV. You should then be tested every 3 months for as long as you are taking PrEP.  Use sunscreen. Apply sunscreen liberally and repeatedly throughout the day. You should seek shade when your shadow is shorter than you. Protect yourself by wearing long sleeves, pants, a wide-brimmed hat, and sunglasses year round whenever you are outdoors.  Tell your health care provider of new moles or changes in moles, especially if there is a change in shape or color. Also, tell your health care provider if a mole is larger than the size of a pencil eraser.  A one-time screening for abdominal aortic aneurysm (AAA) and surgical repair of large AAAs by ultrasound is recommended for men aged 65-75 years who are current or former smokers.  Stay current with your vaccines (immunizations).   This information is not intended to replace advice given to you by your health care provider. Make sure you discuss any questions you have with your health care provider.   Document Released: 05/18/2008 Document Revised: 12/11/2014 Document Reviewed: 04/17/2011 Elsevier Interactive Patient Education [employer]

## 2016-04-21 NOTE — Assessment & Plan Note (Signed)
Taking Flomax daily Symptoms well-controlled Continue above

## 2016-04-21 NOTE — Assessment & Plan Note (Signed)
BP well controlled Current regimen effective and well tolerated Continue current medications at current doses  

## 2016-04-21 NOTE — Progress Notes (Signed)
Subjective:    Patient ID: Karl [name], male    DOB: June 24, 1956, 60 y.o.   MRN: [id number]  HPI He is here to establish with a new pcp.   He is here for a physical exam.   Allergies:  He has seasonal allergies and has symptoms most of the year.  He has tried benadryl as needed and it helps.    Diabetes: He is taking his medication daily as prescribed. He is compliant with a diabetic diet. He is exercising regularly. He monitors his sugars every other day and they have been running 150ish. He checks his feet daily and denies foot lesions. He is up-to-date with an ophthalmology examination.   He is taking all his medication daily as prescribed.     Medications and allergies reviewed with patient and updated if appropriate.  Patient Active Problem List   Diagnosis Date Noted  . Dyslipidemia 09/01/2015  . BPH without urinary obstruction 09/01/2015  . Diabetes mellitus type 2, uncontrolled, without complications (HCC) 09/17/2014  . DM (diabetes mellitus), type 2, uncontrolled, periph vascular complic (HCC) 07/10/2014  . Essential hypertension 04/20/2009  . CARDIOMYOPATHY, ISCHEMIC 04/20/2009  . LEFT BUNDLE BRANCH BLOCK 04/20/2009  . Chronic systolic heart failure (HCC) 04/20/2009    Current Outpatient Prescriptions on File Prior to Visit  Medication Sig Dispense Refill  . acetaminophen (TYLENOL) 500 MG tablet Take 500 mg by mouth every 6 (six) hours as needed for pain.    Marland Kitchen aspirin 325 MG tablet Take 1 tablet (325 mg total) by mouth daily. 30 tablet 11  . carvedilol (COREG) 12.5 MG tablet Take 1.5 tablets (18.75 mg total) by mouth 2 (two) times daily. 270 tablet 3  . enalapril (VASOTEC) 5 MG tablet TAKE ONE TABLET BY MOUTH TWICE DAILY 60 tablet 11  . glimepiride (AMARYL) 2 MG tablet Take 1 tablet (2 mg total) by mouth daily with breakfast. Must keep appt w/new PCP for future refills 30 tablet 0  . metFORMIN (GLUCOPHAGE) 1000 MG tablet Take 1 tablet (1,000 mg total) by mouth 2  (two) times daily with a meal. Must transfer care to new provider for future refill 60 tablet 0  . ONE TOUCH ULTRA TEST test strip 1 each by Other route 2 (two) times daily.     Letta Pate DELICA LANCETS 33G MISC 1 each 2 (two) times daily.     . pravastatin (PRAVACHOL) 20 MG tablet Take 1 tablet (20 mg total) by mouth daily. 90 tablet 3  . tamsulosin (FLOMAX) 0.4 MG CAPS capsule Take 1 capsule (0.4 mg total) by mouth daily after supper. 30 capsule 5  . sildenafil (VIAGRA) 50 MG tablet Take 1 tablet (50 mg total) by mouth daily as needed for erectile dysfunction. (Patient not taking: Reported on 04/21/2016) 10 tablet 1   No current facility-administered medications on file prior to visit.    Past Medical History  Diagnosis Date  . Coronary artery disease   . Diabetes mellitus (HCC) 07/2014 dx    Past Surgical History  Procedure Laterality Date  . Coronary stent placement  1998    Social History   Social History  . Marital Status: Married    Spouse Name: N/A  . Number of Children: N/A  . Years of Education: N/A   Social History Main Topics  . Smoking status: Former Smoker -- 2.00 packs/day for 5 years    Types: Cigarettes    Quit date: 12/04/2000  . Smokeless tobacco: None  . Alcohol  Use: Yes     Comment: weekends  . Drug Use: None  . Sexual Activity: Not Asked   Other Topics Concern  . None   Social History Narrative   Former smoker - quit 1998 after stent -    Lives with dtr and 2 g-sons   Separated from wife in 2005, good terms   Employed with [employer]    Family History  Problem Relation Age of Onset  . Diabetes Mother   . Diabetes Father   . Diabetes Other     nephew  . Diabetes Brother   . Diabetes Maternal Grandmother   . Stroke Father 52  . Diabetes Sister   . Clotting disorder Sister 73    Review of Systems  Constitutional: Negative for fever, chills, appetite change and fatigue.  HENT: Negative for hearing loss.   Eyes:  Negative for visual disturbance.  Respiratory: Negative for cough, shortness of breath and wheezing.   Cardiovascular: Negative for chest pain, palpitations and leg swelling.  Gastrointestinal: Negative for nausea, abdominal pain, diarrhea, constipation and blood in stool.       Occ gerd  Genitourinary: Negative for dysuria, hematuria and difficulty urinating.  Musculoskeletal: Negative for myalgias, back pain and arthralgias.  Skin: Negative for color change and rash.  Neurological: Negative for dizziness, light-headedness, numbness and headaches.  Psychiatric/Behavioral: Negative for dysphoric mood. The patient is not nervous/anxious.        Objective:   Filed Vitals:   04/21/16 1331  BP: 110/84  Pulse: 67  Temp: 97.9 F (36.6 C)   Filed Weights   04/21/16 1331  Weight: 238 lb 8 oz (108.183 kg)   Body mass index is 30.22 kg/(m^2).   Physical Exam Constitutional: He appears well-developed and well-nourished. No distress.  HENT:  Head: Normocephalic and atraumatic.  Right Ear: External ear normal.  Left Ear: External ear normal.  Mouth/Throat: Oropharynx is clear and moist.  Normal ear canals and TM b/l  Eyes: Conjunctivae and EOM are normal.  Neck: Neck supple. No tracheal deviation present. No thyromegaly present.  No carotid bruit  Cardiovascular: Normal rate, regular rhythm, normal heart sounds and intact distal pulses.   No murmur heard. Pulmonary/Chest: Effort normal and breath sounds normal. No respiratory distress. He has no wheezes. He has no rales.  Abdominal: Soft. Bowel sounds are normal. He exhibits no distension. There is no tenderness.  Genitourinary: deferred  Musculoskeletal: He exhibits no edema.  Lymphadenopathy:    He has no cervical adenopathy.  Skin: Skin is warm and dry. He is not diaphoretic.  Psychiatric: He has a normal mood and affect. His behavior is normal.       Assessment & Plan:   Physical exam: Screening blood work  ordered Immunizations - shingles today, will give pneumonia vaccine at next visit Colonoscopy - he has never had one - referred today Eye exams  Up to date  Exercise - very active at work, no other exercise Weight - overweight - would benefit from weight loss - decrease portions Skin  - no skin concerns Substance abuse - no abuse  See Problem List for Assessment and Plan of chronic medical problems.  F/u in 6 months

## 2016-04-21 NOTE — Progress Notes (Signed)
Pre visit review using our clinic review tool, if applicable. No additional management support is needed unless otherwise documented below in the visit note. 

## 2016-04-22 LAB — PSA, TOTAL AND FREE
PSA, Free Pct: 24 % (ref >25)
PSA, Free: 0.83 ng/mL
PSA: 3.46 ng/mL (ref <=4.00)

## 2016-04-24 ENCOUNTER — Other Ambulatory Visit: Payer: Self-pay | Admitting: Emergency Medicine

## 2016-04-24 MED ORDER — PRAVASTATIN SODIUM 40 MG PO TABS: 40.0000 mg | ORAL_TABLET | Freq: Every day | ORAL | Status: DC

## 2016-05-19 ENCOUNTER — Encounter: Payer: Self-pay | Admitting: Internal Medicine

## 2016-05-29 ENCOUNTER — Encounter: Payer: Self-pay | Admitting: Internal Medicine

## 2016-09-10 ENCOUNTER — Encounter (HOSPITAL_COMMUNITY): Payer: Self-pay | Admitting: Nurse Practitioner

## 2016-09-10 ENCOUNTER — Emergency Department (HOSPITAL_COMMUNITY)
Admission: EM | Admit: 2016-09-10 | Discharge: 2016-09-10 | Disposition: A | Discharge status: Home / Self Care | Payer: Managed Care, Other (non HMO) | Attending: Emergency Medicine | Admitting: Emergency Medicine

## 2016-09-10 DIAGNOSIS — E1151 Type 2 diabetes mellitus with diabetic peripheral angiopathy without gangrene: Secondary | ICD-10-CM | POA: Diagnosis not present

## 2016-09-10 DIAGNOSIS — Z87891 Personal history of nicotine dependence: Secondary | ICD-10-CM | POA: Insufficient documentation

## 2016-09-10 DIAGNOSIS — Z7984 Long term (current) use of oral hypoglycemic drugs: Secondary | ICD-10-CM | POA: Insufficient documentation

## 2016-09-10 DIAGNOSIS — Z79899 Other long term (current) drug therapy: Secondary | ICD-10-CM | POA: Insufficient documentation

## 2016-09-10 DIAGNOSIS — Z955 Presence of coronary angioplasty implant and graft: Secondary | ICD-10-CM | POA: Diagnosis not present

## 2016-09-10 DIAGNOSIS — Z7982 Long term (current) use of aspirin: Secondary | ICD-10-CM | POA: Diagnosis not present

## 2016-09-10 DIAGNOSIS — I110 Hypertensive heart disease with heart failure: Secondary | ICD-10-CM | POA: Insufficient documentation

## 2016-09-10 DIAGNOSIS — R339 Retention of urine, unspecified: Secondary | ICD-10-CM | POA: Insufficient documentation

## 2016-09-10 DIAGNOSIS — I5022 Chronic systolic (congestive) heart failure: Secondary | ICD-10-CM | POA: Insufficient documentation

## 2016-09-10 DIAGNOSIS — R300 Dysuria: Secondary | ICD-10-CM | POA: Diagnosis present

## 2016-09-10 DIAGNOSIS — I2510 Atherosclerotic heart disease of native coronary artery without angina pectoris: Secondary | ICD-10-CM | POA: Insufficient documentation

## 2016-09-10 LAB — CBC WITH DIFFERENTIAL/PLATELET
BASOS ABS: 0 10*3/uL (ref 0.0–0.1)
BASOS PCT: 0 %
EOS ABS: 0 10*3/uL (ref 0.0–0.7)
Eosinophils Relative: 0 %
HEMATOCRIT: 45.8 % (ref 39.0–52.0)
HEMOGLOBIN: 15.3 g/dL (ref 13.0–17.0)
Lymphocytes Relative: 13 %
Lymphs Abs: 1.1 10*3/uL (ref 0.7–4.0)
MCH: 26 pg (ref 26.0–34.0)
MCHC: 33.4 g/dL (ref 30.0–36.0)
MCV: 77.9 fL — ABNORMAL LOW (ref 78.0–100.0)
MONOS PCT: 6 %
Monocytes Absolute: 0.5 10*3/uL (ref 0.1–1.0)
NEUTROS ABS: 6.8 10*3/uL (ref 1.7–7.7)
NEUTROS PCT: 81 %
Platelets: 295 10*3/uL (ref 150–400)
RBC: 5.88 MIL/uL — AB (ref 4.22–5.81)
RDW: 14 % (ref 11.5–15.5)
WBC: 8.5 10*3/uL (ref 4.0–10.5)

## 2016-09-10 LAB — URINALYSIS, ROUTINE W REFLEX MICROSCOPIC
BILIRUBIN URINE: NEGATIVE
Glucose, UA: 100 mg/dL — AB
Hgb urine dipstick: NEGATIVE
KETONES UR: NEGATIVE mg/dL
LEUKOCYTES UA: NEGATIVE
NITRITE: NEGATIVE
PH: 7.5 (ref 5.0–8.0)
Protein, ur: NEGATIVE mg/dL
Specific Gravity, Urine: 1.017 (ref 1.005–1.030)

## 2016-09-10 LAB — I-STAT CHEM 8, ED
BUN: 13 mg/dL (ref 6–20)
CHLORIDE: 102 mmol/L (ref 101–111)
CREATININE: 1 mg/dL (ref 0.61–1.24)
Calcium, Ion: 1.15 mmol/L (ref 1.15–1.40)
Glucose, Bld: 131 mg/dL — ABNORMAL HIGH (ref 65–99)
HEMATOCRIT: 48 % (ref 39.0–52.0)
Hemoglobin: 16.3 g/dL (ref 13.0–17.0)
POTASSIUM: 4.1 mmol/L (ref 3.5–5.1)
SODIUM: 138 mmol/L (ref 135–145)
TCO2: 24 mmol/L (ref 0–100)

## 2016-09-10 MED ORDER — ONDANSETRON 4 MG PO TBDP
4.0000 mg | ORAL_TABLET | Freq: Once | ORAL | Status: AC
  Administered 2016-09-10: 4 mg via ORAL
  Filled 2016-09-10: qty 1

## 2016-09-10 MED ORDER — ONDANSETRON HCL 4 MG/2ML IJ SOLN: 4.0000 mg | Freq: Once | INTRAMUSCULAR | Status: DC

## 2016-09-10 NOTE — ED Notes (Signed)
Pt was given ice water for po challenge. 

## 2016-09-10 NOTE — ED Triage Notes (Signed)
Pt c/o urinary urgency, frequency and dysuria with nausea and vomiting with an onset of this a.m. Endorses hx of BPH with obstruction.

## 2016-09-10 NOTE — ED Notes (Signed)
Pt tolerated ice water well; pt denies nausea at this time. 

## 2016-09-10 NOTE — ED Provider Notes (Signed)
[facility]-EMERGENCY DEPT Provider Note   CSN: [id number] Arrival date & time: 09/10/16  1755     History   Chief Complaint Chief Complaint  Patient presents with  . Urinary Urgency  . Urinary Frequency  . Dysuria    HPI Karl [name] is a 60 y.o. male.  HPI Patient presents with urinary problems. States that he is felt like he really has to go to the bathroom for the last couple days and then just goes with a little amount of urination. States that he almost immediately. He has to go again. No fevers or chills. States he has little burning at the end of his penis. No fevers. States he has had some nausea vomiting and diarrhea. Does have a previous history of prostate issues. Has previously had to have a catheter. Slight lower abdominal pain.   Past Medical History:  Diagnosis Date  . Coronary artery disease   . Diabetes mellitus (HCC) 07/2014 dx    Patient Active Problem List   Diagnosis Date Noted  . Dyslipidemia 09/01/2015  . BPH without urinary obstruction 09/01/2015  . Diabetes mellitus type 2, uncontrolled, without complications (HCC) 09/17/2014  . DM (diabetes mellitus), type 2, uncontrolled, periph vascular complic (HCC) 07/10/2014  . Essential hypertension 04/20/2009  . CARDIOMYOPATHY, ISCHEMIC 04/20/2009  . LEFT BUNDLE BRANCH BLOCK 04/20/2009  . Chronic systolic heart failure (HCC) 04/20/2009    Past Surgical History:  Procedure Laterality Date  . CORONARY STENT PLACEMENT  1998       Home Medications    Prior to Admission medications   Medication Sig Start Date End Date Taking? Authorizing Provider  aspirin 325 MG tablet Take 1 tablet (325 mg total) by mouth daily. Patient taking differently: Take 325 mg by mouth every morning.  02/03/15  Yes Marinus Maw, MD  carvedilol (COREG) 12.5 MG tablet Take 1.5 tablets (18.75 mg total) by mouth 2 (two) times daily. 03/07/16 03/07/17 Yes Marinus Maw, MD  enalapril (VASOTEC) 5 MG tablet Take 1 tablet (5 mg total) by  mouth 2 (two) times daily. 04/21/16  Yes Pincus Sanes, MD  glimepiride (AMARYL) 2 MG tablet Take 1 tablet (2 mg total) by mouth daily with breakfast. 04/21/16  Yes Pincus Sanes, MD  metFORMIN (GLUCOPHAGE) 1000 MG tablet Take 1 tablet (1,000 mg total) by mouth 2 (two) times daily with a meal. 04/21/16  Yes Pincus Sanes, MD  ONE TOUCH ULTRA TEST test strip 1 each by Other route 2 (two) times daily.  08/05/14  Yes Historical Provider, MD  Dola Argyle LANCETS 33G MISC 1 each 2 (two) times daily.  08/07/14  Yes Historical Provider, MD  pravastatin (PRAVACHOL) 40 MG tablet Take 1 tablet (40 mg total) by mouth daily. Patient taking differently: Take 40 mg by mouth every evening.  04/24/16  Yes Pincus Sanes, MD  tamsulosin (FLOMAX) 0.4 MG CAPS capsule Take 1 capsule (0.4 mg total) by mouth daily after supper. 04/21/16  Yes Pincus Sanes, MD  sildenafil (VIAGRA) 50 MG tablet Take 1 tablet (50 mg total) by mouth daily as needed for erectile dysfunction. Patient not taking: Reported on 09/10/2016 03/16/15   Marinus Maw, MD    Family History Family History  Problem Relation Age of Onset  . Diabetes Mother   . Diabetes Father   . Stroke Father 27  . Diabetes Sister   . Clotting disorder Sister 22  . Diabetes Other     nephew  . Diabetes Brother   .  Diabetes Maternal Grandmother     Social History Social History  Substance Use Topics  . Smoking status: Former Smoker    Packs/day: 2.00    Years: 5.00    Types: Cigarettes    Quit date: 12/04/2000  . Smokeless tobacco: Never Used  . Alcohol use Yes     Comment: weekends     Allergies   Lipitor [atorvastatin]   Review of Systems Review of Systems  Constitutional: Positive for chills. Negative for appetite change.  Respiratory: Negative for chest tightness.   Cardiovascular: Negative for chest pain.  Gastrointestinal: Positive for abdominal pain, diarrhea, nausea and vomiting.  Genitourinary: Positive for frequency and urgency. Negative  for dysuria and flank pain.  Musculoskeletal: Negative for back pain.  Neurological: Negative for tremors and numbness.  Psychiatric/Behavioral: Negative for confusion.     Physical Exam Updated Vital Signs BP 120/77   Pulse 82   Temp 97.9 F (36.6 C) (Oral)   Resp 20   SpO2 96%   Physical Exam  Constitutional: He appears well-developed.  HENT:  Head: Atraumatic.  Neck: Neck supple.  Cardiovascular: Normal rate.   Pulmonary/Chest: Effort normal.  Abdominal: He exhibits mass. There is tenderness.  Low abdominal tenderness and fullness.  Genitourinary: Penis normal.  Neurological: He is alert.  Skin: Skin is warm. Capillary refill takes less than 2 seconds.  Psychiatric: He has a normal mood and affect.     ED Treatments / Results  Labs (all labs ordered are listed, but only abnormal results are displayed) Labs Reviewed  URINALYSIS, ROUTINE W REFLEX MICROSCOPIC (NOT AT [facility]) - Abnormal; Notable for the following:       Result Value   Glucose, UA 100 (*)    All other components within normal limits  CBC WITH DIFFERENTIAL/PLATELET - Abnormal; Notable for the following:    RBC 5.88 (*)    MCV 77.9 (*)    All other components within normal limits  I-STAT CHEM 8, ED - Abnormal; Notable for the following:    Glucose, Bld 131 (*)    All other components within normal limits    EKG  EKG Interpretation None       Radiology No results found.  Procedures Procedures (including critical care time)  Medications Ordered in ED Medications  ondansetron (ZOFRAN-ODT) disintegrating tablet 4 mg (4 mg Oral Given 09/10/16 1942)     Initial Impression / Assessment and Plan / ED Course  I have reviewed the triage vital signs and the nursing notes.  Pertinent labs & imaging results that were available during my care of the patient were reviewed by me and considered in my medical decision making (see chart for details).  Clinical Course    Patient urinary frequency and  small volumes. Found to be retaining urine. Foley catheter in for much better. No renal failure no infection. Has BPH and a urologist. Will discharge home.  Final Clinical Impressions(s) / ED Diagnoses   Final diagnoses:  Urinary retention    New Prescriptions New Prescriptions   No medications on file     Benjiman CoreNathan Vencil Basnett, MD 09/10/16 2046

## 2016-10-19 ENCOUNTER — Ambulatory Visit: Payer: No Typology Code available for payment source | Admitting: Internal Medicine

## 2016-10-30 ENCOUNTER — Ambulatory Visit: Payer: No Typology Code available for payment source | Admitting: Internal Medicine

## 2016-11-20 ENCOUNTER — Other Ambulatory Visit: Payer: Self-pay | Admitting: Internal Medicine

## 2016-11-23 ENCOUNTER — Other Ambulatory Visit: Payer: Self-pay | Admitting: Internal Medicine

## 2016-12-19 ENCOUNTER — Other Ambulatory Visit: Payer: Self-pay | Admitting: Internal Medicine

## 2016-12-25 ENCOUNTER — Other Ambulatory Visit: Payer: Self-pay | Admitting: Internal Medicine

## 2016-12-25 NOTE — Telephone Encounter (Addendum)
Patient has scheduled appt for feb for follow up.  Daughter states she was told his medications would be refilled until that time. Daughter states that patient is completely out of medications.  Requesting call once sent. Daughter is requesting call at [phone]

## 2016-12-25 NOTE — Telephone Encounter (Signed)
Spoke with pt's daughter

## 2016-12-25 NOTE — Telephone Encounter (Signed)
Rec'd call pt daughter states dad is out of his Glipizide and needing refill. Inform daughter per chart MD states he is need OV. Last refill sent in 30 day until he come in appt has not been made. Daughter is going to have dad to call for appt. Inform after appt has been made we can send enough in until appt.....Raechel Chute

## 2017-01-10 ENCOUNTER — Other Ambulatory Visit (INDEPENDENT_AMBULATORY_CARE_PROVIDER_SITE_OTHER): Payer: Managed Care, Other (non HMO)

## 2017-01-10 ENCOUNTER — Encounter: Payer: Self-pay | Admitting: Internal Medicine

## 2017-01-10 ENCOUNTER — Ambulatory Visit (INDEPENDENT_AMBULATORY_CARE_PROVIDER_SITE_OTHER): Payer: Managed Care, Other (non HMO) | Admitting: Internal Medicine

## 2017-01-10 VITALS — BP 112/78 | HR 91 | Temp 98.1°F | Resp 16 | Wt 238.0 lb

## 2017-01-10 DIAGNOSIS — Z1211 Encounter for screening for malignant neoplasm of colon: Secondary | ICD-10-CM | POA: Diagnosis not present

## 2017-01-10 DIAGNOSIS — E1151 Type 2 diabetes mellitus with diabetic peripheral angiopathy without gangrene: Secondary | ICD-10-CM

## 2017-01-10 DIAGNOSIS — E1165 Type 2 diabetes mellitus with hyperglycemia: Secondary | ICD-10-CM

## 2017-01-10 DIAGNOSIS — IMO0002 Reserved for concepts with insufficient information to code with codable children: Secondary | ICD-10-CM

## 2017-01-10 DIAGNOSIS — I10 Essential (primary) hypertension: Secondary | ICD-10-CM

## 2017-01-10 DIAGNOSIS — N400 Enlarged prostate without lower urinary tract symptoms: Secondary | ICD-10-CM

## 2017-01-10 DIAGNOSIS — E785 Hyperlipidemia, unspecified: Secondary | ICD-10-CM

## 2017-01-10 LAB — COMPREHENSIVE METABOLIC PANEL
ALBUMIN: 4.2 g/dL (ref 3.5–5.2)
ALT: 14 U/L (ref 0–53)
AST: 15 U/L (ref 0–37)
Alkaline Phosphatase: 55 U/L (ref 39–117)
BILIRUBIN TOTAL: 0.5 mg/dL (ref 0.2–1.2)
BUN: 17 mg/dL (ref 6–23)
CALCIUM: 9.7 mg/dL (ref 8.4–10.5)
CHLORIDE: 103 meq/L (ref 96–112)
CO2: 26 mEq/L (ref 19–32)
CREATININE: 1.14 mg/dL (ref 0.40–1.50)
GFR: 83.63 mL/min (ref >60.00)
Glucose, Bld: 110 mg/dL — ABNORMAL HIGH (ref 70–99)
Potassium: 4 mEq/L (ref 3.5–5.1)
Sodium: 137 mEq/L (ref 135–145)
Total Protein: 7.7 g/dL (ref 6.0–8.3)

## 2017-01-10 LAB — HEMOGLOBIN A1C: HEMOGLOBIN A1C: 6.9 % — AB (ref 4.6–6.5)

## 2017-01-10 MED ORDER — GLIMEPIRIDE 2 MG PO TABS: ORAL_TABLET | ORAL | 1 refills | Status: DC

## 2017-01-10 MED ORDER — METFORMIN HCL 1000 MG PO TABS: 1000.0000 mg | ORAL_TABLET | Freq: Two times a day (BID) | ORAL | 1 refills | Status: DC

## 2017-01-10 MED ORDER — TAMSULOSIN HCL 0.4 MG PO CAPS: 0.4000 mg | ORAL_CAPSULE | Freq: Every day | ORAL | 1 refills | Status: DC

## 2017-01-10 MED ORDER — PRAVASTATIN SODIUM 40 MG PO TABS: 40.0000 mg | ORAL_TABLET | Freq: Every day | ORAL | 1 refills | Status: DC

## 2017-01-10 NOTE — Progress Notes (Signed)
Subjective:    Patient ID: Karl [name], male    DOB: [birth year], 61 y.o.   MRN: [id number]  HPI He is here for follow up.  Diabetes: He is taking his medication daily as prescribed. He is compliant with a diabetic diet. He stopped drinking beer and liquor and is drinking red wine.  He thinks this has improved his sugars.  He is very active at work, but not exercising regularly. He monitors his sugars and it was 113 last weekend. He checks his feet daily and denies foot lesions. He is not up-to-date with an ophthalmology examination.   Hypertension: He is taking his medication daily. He is compliant with a low sodium diet.  He denies chest pain, palpitations, edema, shortness of breath and regular headaches. He is not exercising regularly.  He does not monitor his blood pressure at home.    Hyperlipidemia: He is taking his medication daily. He is compliant with a low fat/cholesterol diet. He is not exercising regularly. He denies myalgias.    Medications and allergies reviewed with patient and updated if appropriate.  Patient Active Problem List   Diagnosis Date Noted  . Dyslipidemia 09/01/2015  . BPH without urinary obstruction 09/01/2015  . Diabetes mellitus type 2, uncontrolled, without complications (HCC) 09/17/2014  . DM (diabetes mellitus), type 2, uncontrolled, periph vascular complic (HCC) 07/10/2014  . Essential hypertension 04/20/2009  . CARDIOMYOPATHY, ISCHEMIC 04/20/2009  . LEFT BUNDLE BRANCH BLOCK 04/20/2009  . Chronic systolic heart failure (HCC) 04/20/2009    Current Outpatient Prescriptions on File Prior to Visit  Medication Sig Dispense Refill  . aspirin 325 MG tablet Take 1 tablet (325 mg total) by mouth daily. (Patient taking differently: Take 325 mg by mouth every morning. ) 30 tablet 11  . carvedilol (COREG) 12.5 MG tablet Take 1.5 tablets (18.75 mg total) by mouth 2 (two) times daily. 270 tablet 3  . enalapril (VASOTEC) 5 MG tablet Take 1 tablet (5 mg  total) by mouth 2 (two) times daily. 60 tablet 11  . glimepiride (AMARYL) 2 MG tablet TAKE ONE TABLET BY MOUTH ONCE DAILY WITH BREAKFAST 30 tablet 0  . metFORMIN (GLUCOPHAGE) 1000 MG tablet Take 1 tablet (1,000 mg total) by mouth 2 (two) times daily with a meal. --- Office visit needed for further refills 60 tablet 0  . ONE TOUCH ULTRA TEST test strip 1 each by Other route 2 (two) times daily.     Letta Pate DELICA LANCETS 33G MISC 1 each 2 (two) times daily.     . pravastatin (PRAVACHOL) 40 MG tablet Take 1 tablet (40 mg total) by mouth daily. ---MUST HAVE APPOINTMENT FOR FURTHER REFILLS 90 tablet 0  . sildenafil (VIAGRA) 50 MG tablet Take 1 tablet (50 mg total) by mouth daily as needed for erectile dysfunction. 10 tablet 1  . tamsulosin (FLOMAX) 0.4 MG CAPS capsule Take 1 capsule (0.4 mg total) by mouth daily after supper. 30 capsule 5   No current facility-administered medications on file prior to visit.     Past Medical History:  Diagnosis Date  . Coronary artery disease   . Diabetes mellitus (HCC) 07/2014 dx    Past Surgical History:  Procedure Laterality Date  . CORONARY STENT PLACEMENT  1998    Social History   Social History  . Marital status: Married    Spouse name: N/A  . Number of children: N/A  . Years of education: N/A   Social History Main Topics  . Smoking status:  Former Smoker    Packs/day: 2.00    Years: 5.00    Types: Cigarettes    Quit date: 12/04/2000  . Smokeless tobacco: Never Used  . Alcohol use Yes     Comment: weekends  . Drug use: Unknown  . Sexual activity: Not on file   Other Topics Concern  . Not on file   Social History Narrative   Former smoker - quit 1998 after stent -    Lives with dtr and 2 g-sons   Separated from wife in 2005, good terms   Employed with [employer] - walks at lot at work   No regular exercise    Family History  Problem Relation Age of Onset  . Diabetes Mother   . Diabetes Father   .  Stroke Father 42  . Diabetes Sister   . Clotting disorder Sister 6  . Diabetes Other     nephew  . Diabetes Brother   . Diabetes Maternal Grandmother     Review of Systems  Constitutional: Negative for chills and fever.  Respiratory: Negative for cough, shortness of breath and wheezing.   Cardiovascular: Negative for chest pain and leg swelling.  Neurological: Negative for light-headedness, numbness and headaches.       Objective:   Vitals:   01/10/17 1447  BP: 112/78  Pulse: 91  Resp: 16  Temp: 98.1 F (36.7 C)   Filed Weights   01/10/17 1447  Weight: 238 lb (108 kg)   Body mass index is 30.15 kg/m.  Wt Readings from Last 3 Encounters:  01/10/17 238 lb (108 kg)  04/21/16 238 lb 8 oz (108.2 kg)  03/07/16 243 lb 9.6 oz (110.5 kg)     Physical Exam Constitutional: Appears well-developed and well-nourished. No distress.  HENT:  Head: Normocephalic and atraumatic.  Neck: Neck supple. No tracheal deviation present. No thyromegaly present.  No cervical lymphadenopathy Cardiovascular: Normal rate, regular rhythm and normal heart sounds.   No murmur heard. No carotid bruit .  No edema Pulmonary/Chest: Effort normal and breath sounds normal. No respiratory distress. No has no wheezes. No rales.  Skin: Skin is warm and dry. Not diaphoretic.  Psychiatric: Normal mood and affect. Behavior is normal.         Assessment & Plan:   See Problem List for Assessment and Plan of chronic medical problems.  FU in 6 months

## 2017-01-10 NOTE — Assessment & Plan Note (Signed)
Check a1c Continue current meds Low sugar / carb diet Stressed regular exercise Fu in 6 months

## 2017-01-10 NOTE — Assessment & Plan Note (Signed)
Taking pravastatin 40 mg daily - tolerating it well Not fasting so will not checked labs

## 2017-01-10 NOTE — Patient Instructions (Signed)
Test(s) ordered today. Your results will be released to MyChart (or called to you) after review, usually within 72hours after test completion. If any changes need to be made, you will be notified at that same time.  All other Health Maintenance issues reviewed.   All recommended immunizations and age-appropriate screenings are up-to-date or discussed.  No immunizations administered today.   Medications reviewed and updated.  No changes recommended at this time.  Your prescription(s) have been submitted to your pharmacy. Please take as directed and contact our office if you believe you are having problem(s) with the medication(s).  A referral was ordered for GI for a colonoscopy  Please followup in 6 months   Health Maintenance, Male A healthy lifestyle and preventative care can promote health and wellness.  Maintain regular health, dental, and eye exams.  Eat a healthy diet. Foods like vegetables, fruits, whole grains, low-fat dairy products, and lean protein foods contain the nutrients you need and are low in calories. Decrease your intake of foods high in solid fats, added sugars, and salt. Get information about a proper diet from your health care provider, if necessary.  Regular physical exercise is one of the most important things you can do for your health. Most adults should get at least 150 minutes of moderate-intensity exercise (any activity that increases your heart rate and causes you to sweat) each week. In addition, most adults need muscle-strengthening exercises on 2 or more days a week.   Maintain a healthy weight. The body mass index (BMI) is a screening tool to identify possible weight problems. It provides an estimate of body fat based on height and weight. Your health care provider can find your BMI and can help you achieve or maintain a healthy weight. For males 20 years and older:  A BMI below 18.5 is considered underweight.  A BMI of 18.5 to 24.9 is normal.  A  BMI of 25 to 29.9 is considered overweight.  A BMI of 30 and above is considered obese.  Maintain normal blood lipids and cholesterol by exercising and minimizing your intake of saturated fat. Eat a balanced diet with plenty of fruits and vegetables. Blood tests for lipids and cholesterol should begin at age 72 and be repeated every 5 years. If your lipid or cholesterol levels are high, you are over age 63, or you are at high risk for heart disease, you may need your cholesterol levels checked more frequently.Ongoing high lipid and cholesterol levels should be treated with medicines if diet and exercise are not working.  If you smoke, find out from your health care provider how to quit. If you do not use tobacco, do not start.  Lung cancer screening is recommended for adults aged 55-80 years who are at high risk for developing lung cancer because of a history of smoking. A yearly low-dose CT scan of the lungs is recommended for people who have at least a 30-pack-year history of smoking and are current smokers or have quit within the past 15 years. A pack year of smoking is smoking an average of 1 pack of cigarettes a day for 1 year (for example, a 30-pack-year history of smoking could mean smoking 1 pack a day for 30 years or 2 packs a day for 15 years). Yearly screening should continue until the smoker has stopped smoking for at least 15 years. Yearly screening should be stopped for people who develop a health problem that would prevent them from having lung cancer treatment.  If  you choose to drink alcohol, do not have more than 2 drinks per day. One drink is considered to be 12 oz (360 mL) of beer, 5 oz (150 mL) of wine, or 1.5 oz (45 mL) of liquor.  Avoid the use of street drugs. Do not share needles with anyone. Ask for help if you need support or instructions about stopping the use of drugs.  High blood pressure causes heart disease and increases the risk of stroke. High blood pressure is more  likely to develop in:  People who have blood pressure in the end of the normal range (100-139/85-89 mm Hg).  People who are overweight or obese.  People who are African American.  If you are 47-64 years of age, have your blood pressure checked every 3-5 years. If you are 15 years of age or older, have your blood pressure checked every year. You should have your blood pressure measured twice-once when you are at a hospital or clinic, and once when you are not at a hospital or clinic. Record the average of the two measurements. To check your blood pressure when you are not at a hospital or clinic, you can use:  An automated blood pressure machine at a pharmacy.  A home blood pressure monitor.  If you are 12-77 years old, ask your health care provider if you should take aspirin to prevent heart disease.  Diabetes screening involves taking a blood sample to check your fasting blood sugar level. This should be done once every 3 years after age 15 if you are at a normal weight and without risk factors for diabetes. Testing should be considered at a younger age or be carried out more frequently if you are overweight and have at least 1 risk factor for diabetes.  Colorectal cancer can be detected and often prevented. Most routine colorectal cancer screening begins at the age of 15 and continues through age 60. However, your health care provider may recommend screening at an earlier age if you have risk factors for colon cancer. On a yearly basis, your health care provider may provide home test kits to check for hidden blood in the stool. A small camera at the end of a tube may be used to directly examine the colon (sigmoidoscopy or colonoscopy) to detect the earliest forms of colorectal cancer. Talk to your health care provider about this at age 20 when routine screening begins. A direct exam of the colon should be repeated every 5-10 years through age 16, unless early forms of precancerous polyps or small  growths are found.  People who are at an increased risk for hepatitis B should be screened for this virus. You are considered at high risk for hepatitis B if:  You were born in a country where hepatitis B occurs often. Talk with your health care provider about which countries are considered high risk.  Your parents were born in a high-risk country and you have not received a shot to protect against hepatitis B (hepatitis B vaccine).  You have HIV or AIDS.  You use needles to inject street drugs.  You live with, or have sex with, someone who has hepatitis B.  You are a man who has sex with other men (MSM).  You get hemodialysis treatment.  You take certain medicines for conditions like cancer, organ transplantation, and autoimmune conditions.  Hepatitis C blood testing is recommended for all people born from [birth year] through 1967 and any individual with known risk factors for hepatitis C.  Healthy men should no longer receive prostate-specific antigen (PSA) blood tests as part of routine cancer screening. Talk to your health care provider about prostate cancer screening.  Testicular cancer screening is not recommended for adolescents or adult males who have no symptoms. Screening includes self-exam, a health care provider exam, and other screening tests. Consult with your health care provider about any symptoms you have or any concerns you have about testicular cancer.  Practice safe sex. Use condoms and avoid high-risk sexual practices to reduce the spread of sexually transmitted infections (STIs).  You should be screened for STIs, including gonorrhea and chlamydia if:  You are sexually active and are younger than 24 years.  You are older than 24 years, and your health care provider tells you that you are at risk for this type of infection.  Your sexual activity has changed since you were last screened, and you are at an increased risk for chlamydia or gonorrhea. Ask your health care  provider if you are at risk.  If you are at risk of being infected with HIV, it is recommended that you take a prescription medicine daily to prevent HIV infection. This is called pre-exposure prophylaxis (PrEP). You are considered at risk if:  You are a man who has sex with other men (MSM).  You are a heterosexual man who is sexually active with multiple partners.  You take drugs by injection.  You are sexually active with a partner who has HIV.  Talk with your health care provider about whether you are at high risk of being infected with HIV. If you choose to begin PrEP, you should first be tested for HIV. You should then be tested every 3 months for as long as you are taking PrEP.  Use sunscreen. Apply sunscreen liberally and repeatedly throughout the day. You should seek shade when your shadow is shorter than you. Protect yourself by wearing long sleeves, pants, a wide-brimmed hat, and sunglasses year round whenever you are outdoors.  Tell your health care provider of new moles or changes in moles, especially if there is a change in shape or color. Also, tell your health care provider if a mole is larger than the size of a pencil eraser.  A one-time screening for abdominal aortic aneurysm (AAA) and surgical repair of large AAAs by ultrasound is recommended for men aged 65-75 years who are current or former smokers.  Stay current with your vaccines (immunizations). This information is not intended to replace advice given to you by your health care provider. Make sure you discuss any questions you have with your health care provider. Document Released: 05/18/2008 Document Revised: 12/11/2014 Document Reviewed: 08/24/2015 Elsevier Interactive Patient Education  2017 [employer].

## 2017-01-10 NOTE — Assessment & Plan Note (Addendum)
Asymptomatic Controlled with flomax Continue flomax daily

## 2017-01-10 NOTE — Progress Notes (Signed)
Pre visit review using our clinic review tool, if applicable. No additional management support is needed unless otherwise documented below in the visit note. 

## 2017-01-10 NOTE — Assessment & Plan Note (Signed)
BP well controlled Current regimen effective and well tolerated Continue current medications at current doses cmp  

## 2017-01-25 ENCOUNTER — Other Ambulatory Visit: Payer: Self-pay | Admitting: Internal Medicine

## 2017-02-06 ENCOUNTER — Encounter: Payer: Self-pay | Admitting: Internal Medicine

## 2017-03-01 ENCOUNTER — Encounter: Payer: Self-pay | Admitting: Internal Medicine

## 2017-03-08 ENCOUNTER — Other Ambulatory Visit: Payer: Self-pay

## 2017-03-08 DIAGNOSIS — I255 Ischemic cardiomyopathy: Secondary | ICD-10-CM

## 2017-03-08 MED ORDER — CARVEDILOL 12.5 MG PO TABS: 18.7500 mg | ORAL_TABLET | Freq: Two times a day (BID) | ORAL | 0 refills | Status: DC

## 2017-04-18 ENCOUNTER — Other Ambulatory Visit: Payer: Self-pay | Admitting: Internal Medicine

## 2017-04-18 DIAGNOSIS — I255 Ischemic cardiomyopathy: Secondary | ICD-10-CM

## 2017-04-25 ENCOUNTER — Ambulatory Visit (INDEPENDENT_AMBULATORY_CARE_PROVIDER_SITE_OTHER): Payer: Managed Care, Other (non HMO) | Admitting: Internal Medicine

## 2017-04-25 ENCOUNTER — Ambulatory Visit (INDEPENDENT_AMBULATORY_CARE_PROVIDER_SITE_OTHER)
Admission: RE | Admit: 2017-04-25 | Discharge: 2017-04-25 | Disposition: A | Discharge status: Home / Self Care | Payer: Managed Care, Other (non HMO) | Source: Ambulatory Visit | Attending: Internal Medicine | Admitting: Internal Medicine

## 2017-04-25 ENCOUNTER — Encounter: Payer: Self-pay | Admitting: Internal Medicine

## 2017-04-25 VITALS — BP 112/74 | HR 68 | Temp 98.8°F | Resp 16 | Wt 231.0 lb

## 2017-04-25 DIAGNOSIS — M25551 Pain in right hip: Secondary | ICD-10-CM | POA: Diagnosis not present

## 2017-04-25 DIAGNOSIS — M25552 Pain in left hip: Secondary | ICD-10-CM

## 2017-04-25 NOTE — Patient Instructions (Signed)
Try doing some hip exercises.  Try walking for exercise regularly.    Take tylenol as needed.   Have xrays today.   If no improvement let me know and I will refer to our sports medicine doctor.   Hip Exercises Ask your health care provider which exercises are safe for you. Do exercises exactly as told by your health care provider and adjust them as directed. It is normal to feel mild stretching, pulling, tightness, or discomfort as you do these exercises, but you should stop right away if you feel sudden pain or your pain gets worse.Do not begin these exercises until told by your health care provider. STRETCHING AND RANGE OF MOTION EXERCISES  These exercises warm up your muscles and joints and improve the movement and flexibility of your hip. These exercises also help to relieve pain, numbness, and tingling. Exercise A: Hamstrings, Supine   1. Lie on your back. 2. Loop a belt or towel over the ball of your left / rightfoot. The ball of your foot is on the walking surface, right under your toes. 3. Straighten your left / rightknee and slowly pull on the belt to raise your leg.  Do not let your left / right knee bend while you do this.  Keep your other leg flat on the floor.  Raise the left / right leg until you feel a gentle stretch behind your left / right knee or thigh. 4. Hold this position for __________ seconds. 5. Slowly return your leg to the starting position. Repeat __________ times. Complete this stretch __________ times a day. Exercise B: Hip Rotators   1. Lie on your back on a firm surface. 2. Hold your left / right knee with your left / right hand. Hold your ankle with your other hand. 3. Gently pull your left / right knee and rotate your lower leg toward your other shoulder.  Pull until you feel a stretch in your buttocks.  Keep your hips and shoulders firmly planted while you do this stretch. 4. Hold this position for __________ seconds. Repeat __________ times.  Complete this stretch __________ times a day. Exercise C: V-Sit (Hamstrings and Adductors)   1. Sit on the floor with your legs extended in a large "V" shape. Keep your knees straight during this exercise. 2. Start with your head and chest upright, then bend at your waist to reach for your left foot (position A). You should feel a stretch in your right inner thigh. 3. Hold this position for __________ seconds. Then slowly return to the upright position. 4. Bend at your waist to reach forward (position B). You should feel a stretch behind both of your thighs and knees. 5. Hold this position for __________ seconds. Then slowly return to the upright position. 6. Bend at your waist to reach for your right foot (position C). You should feel a stretch in your left inner thigh. 7. Hold this position for __________ seconds. Then slowly return to the upright position. Repeat __________ times. Complete this stretch __________ times a day. Exercise D: Lunge (Hip Flexors)   1. Place your left / right knee on the floor and bend your other knee so that is directly over your ankle. You should be half-kneeling. 2. Keep good posture with your head over your shoulders. 3. Tighten your buttocks to point your tailbone downward. This helps your back to keep from arching too much. 4. You should feel a gentle stretch in the front of your left / right thigh  and hip. If you do not feel any resistance, slightly slide your other foot forward and then slowly lunge forward so your knee once again lines up over your ankle. 5. Make sure your tailbone continues to point downward. 6. Hold this position for __________ seconds. Repeat __________ times. Complete this stretch __________ times a day. STRENGTHENING EXERCISES  These exercises build strength and endurance in your hip. Endurance is the ability to use your muscles for a long time, even after they get tired. Exercise E: Bridge (Hip Extensors)   1. Lie on your back on a  firm surface with your knees bent and your feet flat on the floor. 2. Tighten your buttocks muscles and lift your bottom off the floor until the trunk of your body is level with your thighs.  Do not arch your back.  You should feel the muscles working in your buttocks and the back of your thighs. If you do not feel these muscles, slide your feet 1-2 inches (2.5-5 cm) farther away from your buttocks. 3. Hold this position for __________ seconds. 4. Slowly lower your hips to the starting position. 5. Let your muscles relax completely between repetitions. 6. If this exercise is too easy, try doing it with your arms crossed over your chest. Repeat __________ times. Complete this exercise __________ times a day. Exercise F: Straight Leg Raises - Hip Abductors   1. Lie on your side with your left / right leg in the top position. Lie so your head, shoulder, knee, and hip line up with each other. You may bend your bottom knee to help you balance. 2. Roll your hips slightly forward, so your hips are stacked directly over each other and your left / right knee is facing forward. 3. Leading with your heel, lift your top leg 4-6 inches (10-15 cm). You should feel the muscles in your outer hip lifting.  Do not let your foot drift forward.  Do not let your knee roll toward the ceiling. 4. Hold this position for __________ seconds. 5. Slowly return to the starting position. 6. Let your muscles relax completely between repetitions. Repeat __________ times. Complete this exercise __________ times a day. Exercise G: Straight Leg Raises - Hip Adductors   1. Lie on your side with your left / right leg in the bottom position. Lie so your head, shoulder, knee, and hip line up. You may place your upper foot in front to help you balance. 2. Roll your hips slightly forward, so your hips are stacked directly over each other and your left / right knee is facing forward. 3. Tense the muscles in your inner thigh and  lift your bottom leg 4-6 inches (10-15 cm). 4. Hold this position for __________ seconds. 5. Slowly return to the starting position. 6. Let your muscles relax completely between repetitions. Repeat __________ times. Complete this exercise __________ times a day. Exercise H: Straight Leg Raises - Quadriceps   1. Lie on your back with your left / right leg extended and your other knee bent. 2. Tense the muscles in the front of your left / right thigh. When you do this, you should see your kneecap slide up or see increased dimpling just above your knee. 3. Tighten these muscles even more and raise your leg 4-6 inches (10-15 cm) off the floor. 4. Hold this position for __________ seconds. 5. Keep these muscles tense as you lower your leg. 6. Relax the muscles slowly and completely between repetitions. Repeat __________ times. Complete this  exercise __________ times a day. Exercise I: Hip Abductors, Standing  1. Tie one end of a rubber exercise band or tubing to a secure surface, such as a table or pole. 2. Loop the other end of the band or tubing around your left / right ankle. 3. Keeping your ankle with the band or tubing directly opposite of the secured end, step away until there is tension in the tubing or band. Hold onto a chair as needed for balance. 4. Lift your left / right leg out to your side. While you do this:  Keep your back upright.  Keep your shoulders over your hips.  Keep your toes pointing forward.  Make sure to use your hip muscles to lift your leg. Do not "throw" your leg or tip your body to lift your leg. 5. Hold this position for __________ seconds. 6. Slowly return to the starting position. Repeat __________ times. Complete this exercise __________ times a day. Exercise J: Squats (Quadriceps)  1. Stand in a door frame so your feet and knees are in line with the frame. You may place your hands on the frame for balance. 2. Slowly bend your knees and lower your hips  like you are going to sit in a chair.  Keep your lower legs in a straight-up-and-down position.  Do not let your hips go lower than your knees.  Do not bend your knees lower than told by your health care provider.  If your hip pain increases, do not bend as low. 3. Hold this position for ___________ seconds. 4. Slowly push with your legs to return to standing. Do not use your hands to pull yourself to standing. Repeat __________ times. Complete this exercise __________ times a day. This information is not intended to replace advice given to you by your health care provider. Make sure you discuss any questions you have with your health care provider. Document Released: 12/08/2005 Document Revised: 08/14/2016 Document Reviewed: 11/15/2015 Elsevier Interactive Patient Education  2017 [employer].

## 2017-04-25 NOTE — Progress Notes (Signed)
Subjective:    Patient ID: Karl [name], male    DOB: 1956/08/01, 61 y.o.   MRN: [id number]  HPI He is here for an acute visit.   His hips lock up after sitting for a while.  He feels very stiff  when he first standsand after a couple of steps he feels ok.  He feels it in his groin.  He has no pain, stiffness or pain with walking.  This started about one week ago.  He denies sitting more than usual.  He did change his mattress.  He denies back pain.   He is not exercising regularly.   Medications and allergies reviewed with patient and updated if appropriate.  Patient Active Problem List   Diagnosis Date Noted  . Dyslipidemia 09/01/2015  . BPH without urinary obstruction 09/01/2015  . DM (diabetes mellitus), type 2, uncontrolled, periph vascular complic (HCC) 07/10/2014  . Essential hypertension 04/20/2009  . CARDIOMYOPATHY, ISCHEMIC 04/20/2009  . LEFT BUNDLE BRANCH BLOCK 04/20/2009  . Chronic systolic heart failure (HCC) 04/20/2009    Current Outpatient Prescriptions on File Prior to Visit  Medication Sig Dispense Refill  . aspirin 325 MG tablet Take 1 tablet (325 mg total) by mouth daily. (Patient taking differently: Take 325 mg by mouth every morning. ) 30 tablet 11  . carvedilol (COREG) 12.5 MG tablet TAKE 1 & 1/2 (ONE & ONE-HALF) TABLETS BY MOUTH TWICE DAILY. PATIENT NEEDS AN OFFICE VISIT FOR FURTHER REFILLS 45 tablet 0  . enalapril (VASOTEC) 5 MG tablet TAKE ONE TABLET BY MOUTH TWICE DAILY 60 tablet 11  . glimepiride (AMARYL) 2 MG tablet TAKE ONE TABLET BY MOUTH ONCE DAILY WITH BREAKFAST 90 tablet 1  . metFORMIN (GLUCOPHAGE) 1000 MG tablet Take 1 tablet (1,000 mg total) by mouth 2 (two) times daily with a meal. 180 tablet 1  . ONE TOUCH ULTRA TEST test strip 1 each by Other route 2 (two) times daily.     Letta Pate DELICA LANCETS 33G MISC 1 each 2 (two) times daily.     . pravastatin (PRAVACHOL) 40 MG tablet Take 1 tablet (40 mg total) by mouth daily. 90 tablet 1  .  sildenafil (VIAGRA) 50 MG tablet Take 1 tablet (50 mg total) by mouth daily as needed for erectile dysfunction. 10 tablet 1  . tamsulosin (FLOMAX) 0.4 MG CAPS capsule Take 1 capsule (0.4 mg total) by mouth daily after supper. 90 capsule 1   No current facility-administered medications on file prior to visit.     Past Medical History:  Diagnosis Date  . Coronary artery disease   . Diabetes mellitus (HCC) 07/2014 dx    Past Surgical History:  Procedure Laterality Date  . CORONARY STENT PLACEMENT  1998    Social History   Social History  . Marital status: Married    Spouse name: N/A  . Number of children: N/A  . Years of education: N/A   Social History Main Topics  . Smoking status: Former Smoker    Packs/day: 2.00    Years: 5.00    Types: Cigarettes    Quit date: 12/04/2000  . Smokeless tobacco: Never Used  . Alcohol use Yes     Comment: weekends  . Drug use: Unknown  . Sexual activity: Not Asked   Other Topics Concern  . None   Social History Narrative   Former smoker - quit 1998 after stent -    Lives with dtr and 2 g-sons   Separated from wife  in 2005, good terms   Employed with [employer] - walks at lot at work   No regular exercise    Family History  Problem Relation Age of Onset  . Diabetes Mother   . Diabetes Father   . Stroke Father 76  . Diabetes Sister   . Clotting disorder Sister 28  . Diabetes Other        nephew  . Diabetes Brother   . Diabetes Maternal Grandmother     Review of Systems  Constitutional: Negative for fever.  Musculoskeletal: Positive for arthralgias. Negative for back pain and myalgias.  Neurological: Negative for weakness and numbness.       Objective:   Vitals:   04/25/17 1605  BP: 112/74  Pulse: 68  Resp: 16  Temp: 98.8 F (37.1 C)   Filed Weights   04/25/17 1605  Weight: 231 lb (104.8 kg)   Body mass index is 29.26 kg/m.  Wt Readings from Last 3 Encounters:  04/25/17 231 lb (104.8  kg)  01/10/17 238 lb (108 kg)  04/21/16 238 lb 8 oz (108.2 kg)     Physical Exam  Constitutional: He appears well-developed and well-nourished. No distress.  Musculoskeletal: Normal range of motion. He exhibits no edema.  No tenderness in groin or lateral aspects of bilateral hips, normal range of motion of both hips, no pain with passive or active movement of hips, no lumbar tenderness with palpation   Neurological:  Gait normal, normal sensation bilateral lower extremities  Skin: He is not diaphoretic.          Assessment & Plan:   See Problem List for Assessment and Plan of chronic medical problems.

## 2017-04-25 NOTE — Assessment & Plan Note (Signed)
In bilateral hips when he first stands after sitting for prolonged period of time-started one week ago Possible hip flexor tightness, most likely arthritis Will check x-rays Given exercises to stretch hips and strength in hips Advised regular exercise Tylenol or Advil as needed If no improvement will refer to sports medicine

## 2017-05-13 ENCOUNTER — Other Ambulatory Visit: Payer: Self-pay | Admitting: Internal Medicine

## 2017-05-13 DIAGNOSIS — I255 Ischemic cardiomyopathy: Secondary | ICD-10-CM

## 2017-05-21 NOTE — Progress Notes (Signed)
[facility] [address] [place], [place] [address] Phone: [phone] Subjective:    I'm seeing this patient by the request  of:  Pincus Sanes, MD   CC: Bilateral hip pain  BJY:[id number]  Karl [name] is a 61 y.o. male coming in with complaint of bilateral hip pain. Patient states that it seems to be worse after sitting for long amount of time. Very stiff. Patient states that after walking for some time seems to be better. Pain seems to be more on the lateral aspect of the hips. Been going on for approximately 1 month. Does not remember any injury. Denies any back pain. Does respond somewhat over-the-counter medications. Rates the severity of pain is 6 out of 10.  Patient also has an area on his back. Seems to be very severe. States that hurts to touch. Was told by friends that there is a bump. Denies any fevers chills but there has been some type of drainage recently.    X-rays were taken and independently visualized by me. X-rays of the lateral hips show some mild osteophytic changes.  Past Medical History:  Diagnosis Date  . Coronary artery disease   . Diabetes mellitus (HCC) 07/2014 dx   Past Surgical History:  Procedure Laterality Date  . CORONARY STENT PLACEMENT  1998   Social History   Social History  . Marital status: Married    Spouse name: N/A  . Number of children: N/A  . Years of education: N/A   Social History Main Topics  . Smoking status: Former Smoker    Packs/day: 2.00    Years: 5.00    Types: Cigarettes    Quit date: 12/04/2000  . Smokeless tobacco: Never Used  . Alcohol use Yes     Comment: weekends  . Drug use: Unknown  . Sexual activity: Not on file   Other Topics Concern  . Not on file   Social History Narrative   Former smoker - quit 1998 after stent -    Lives with dtr and 2 g-sons   Separated from wife in 2005, good terms   Employed with [employer] - walks at lot at work   No  regular exercise   Allergies  Allergen Reactions  . Lipitor [Atorvastatin] Nausea And Vomiting   Family History  Problem Relation Age of Onset  . Diabetes Mother   . Diabetes Father   . Stroke Father 87  . Diabetes Sister   . Clotting disorder Sister 56  . Diabetes Other        nephew  . Diabetes Brother   . Diabetes Maternal Grandmother     Past medical history, social, surgical and family history all reviewed in electronic medical record.  No pertanent information unless stated regarding to the chief complaint.   Review of Systems:Review of systems updated and as accurate as of 05/21/17  No headache, visual changes, nausea, vomiting, diarrhea, constipation, dizziness, abdominal pain, skin rash, fevers, chills, night sweats, weight loss, swollen lymph nodes, body aches, joint swelling, muscle aches, chest pain, shortness of breath, mood changes.   Objective  There were no vitals taken for this visit. Systems examined below as of 05/21/17   General: No apparent distress alert and oriented x3 mood and affect normal, dressed appropriately.  HEENT: Pupils equal, extraocular movements intact  Respiratory: Patient's speak in full sentences and does not appear short of breath  Cardiovascular: No lower extremity edema, non tender, no erythema  Skin: Warm dry intact with no signs of infection or rash on extremities or on axial skeleton.  Abdomen: Soft nontender  Neuro: Cranial nerves II through XII are intact, neurovascularly intact in all extremities with 2+ DTRs and 2+ pulses.  Lymph: No lymphadenopathy of posterior or anterior cervical chain or axillae bilaterally.  Gait normal with good balance and coordination.  MSK:  Non tender with full range of motion and good stability and symmetric strength and tone of shoulders, elbows, wrist, knee and ankles bilaterally.  ONG:[id number] ROM IR: 15 Deg, ER: 45 Deg, Flexion: 110 Deg, Extension: 80 Deg, Abduction: 45 Deg, Adduction: 25  Deg Strength IR: 5/5, ER: 5/5, Flexion: 5/5, Extension: 5/5, Abduction: 5/5, Adduction: 5/5 Pelvic alignment unremarkable to inspection and palpation. Standing hip rotation and gait without trendelenburg sign / unsteadiness. Greater trochanter without tenderness to palpation. No tenderness over piriformis and greater trochanter. Significant tightness with Pearlean Brownie No SI joint tenderness and normal minimal SI movement.  Back exam shows the patient does have a large abscess on the right thoracic spine proximally at T7. Severely tender to palpation.  After verbal consent patient was prepped with alcohol swabs and with a 25-gauge needle patient was injected with a total of 3 mL of 0.5% Marcaine. Patient then had a 10 blade introduced in the middle of the abscess. Patient then had pressure and had significant amount of posterior. Pressure continued until there was only blood left. No packing was needed. Blood loss estimated at 3 mL. Bandage noted. Post incision instructions given.  Procedure note 97110; 15 minutes spent for Therapeutic exercises as stated in above notes.  This included exercises focusing on stretching, strengthening, with significant focus on eccentric aspects.  Hip strengthening exercises which included:  Pelvic tilt/bracing to help with proper recruitment of the lower abs and pelvic floor muscles  Glute strengthening to properly contract glutes without over-engaging low back and hamstrings - prone hip extension and glute bridge exercises Proper stretching techniques to increase effectiveness for the hip flexors, groin, quads, piriformic and low back when appropriate    Proper technique shown and discussed handout in great detail with ATC.  All questions were discussed and answered.     Impression and Recommendations:     This case required medical decision making of moderate complexity.      Note: This dictation was prepared with Dragon dictation along with smaller phrase  technology. Any transcriptional errors that result from this process are unintentional.

## 2017-05-22 ENCOUNTER — Ambulatory Visit (INDEPENDENT_AMBULATORY_CARE_PROVIDER_SITE_OTHER): Payer: Managed Care, Other (non HMO) | Admitting: Family Medicine

## 2017-05-22 ENCOUNTER — Encounter: Payer: Self-pay | Admitting: Family Medicine

## 2017-05-22 DIAGNOSIS — M76899 Other specified enthesopathies of unspecified lower limb, excluding foot: Secondary | ICD-10-CM | POA: Diagnosis not present

## 2017-05-22 DIAGNOSIS — L02212 Cutaneous abscess of back [any part, except buttock]: Secondary | ICD-10-CM

## 2017-05-22 MED ORDER — DOXYCYCLINE HYCLATE 100 MG PO TABS: 100.0000 mg | ORAL_TABLET | Freq: Two times a day (BID) | ORAL | 0 refills | Status: AC

## 2017-05-22 NOTE — Assessment & Plan Note (Signed)
Patient did have incision and drainage done today. Tolerated the procedure well. Doxycycline given to cover MRSA. Patient will monitor for any signs of other infectious etiology. Following up with primary care provider in one week.

## 2017-05-22 NOTE — Assessment & Plan Note (Signed)
Patient does have what appears to be more of a hip flexor tendinitis. I think it is bilateral. Patient does have significant tightness. Work with Event organiser to learn home exercises in greater detail. We discussed icing regimen. Over-the-counter medications. Do not feel that any significant medications are needed at this point. Follow-up again in 4 weeks. At follow-up we'll consider ultrasound as well as potentially osteopathic manipulation and further imaging.

## 2017-05-22 NOTE — Patient Instructions (Addendum)
You had an abscess.  Drained it today  Doxycycline 2 times a day for 1 weeks.  Exercises 3 times a week.  Ice 20 minutes 2 times daily. Usually after activity and before bed. Make sure you stretch after a lot of activity  See me again in 4 weeks

## 2017-05-23 ENCOUNTER — Ambulatory Visit: Payer: Managed Care, Other (non HMO) | Admitting: Internal Medicine

## 2017-05-30 ENCOUNTER — Encounter: Payer: Self-pay | Admitting: Internal Medicine

## 2017-05-30 ENCOUNTER — Ambulatory Visit (INDEPENDENT_AMBULATORY_CARE_PROVIDER_SITE_OTHER): Payer: Managed Care, Other (non HMO) | Admitting: Internal Medicine

## 2017-05-30 VITALS — BP 132/78 | HR 65 | Temp 97.2°F | Resp 16 | Wt 233.0 lb

## 2017-05-30 DIAGNOSIS — L02212 Cutaneous abscess of back [any part, except buttock]: Secondary | ICD-10-CM

## 2017-05-30 MED ORDER — DOXYCYCLINE HYCLATE 100 MG PO TABS: 100.0000 mg | ORAL_TABLET | Freq: Two times a day (BID) | ORAL | 0 refills | Status: DC

## 2017-05-30 NOTE — Assessment & Plan Note (Signed)
given small amount of pus extracted and area of induration will extend doxycycline for three additional days - total of 10 days Warm compresses, let hot shower hit wound Monitor for continued or recurrent infection Discussed possible derm referral for cyst sac removal - he deferred Advised infection can recur

## 2017-05-30 NOTE — Patient Instructions (Signed)
Continue the antibiotic for an additional three days.  Monitor for the infection recurring. If this occurs apply warm compresses.  Apply a bandage for as long as it is draining.   Call with any questions or concerns.

## 2017-05-30 NOTE — Progress Notes (Signed)
Subjective:    Patient ID: Karl [name], male    DOB: 06-16-1956, 61 y.o.   MRN: [id number]  HPI He is here for follow up of an abscess  Last week he saw Dr. Katrinka Blazing for bilateral hip pain and mentioned an area on his back that was very tender. It was tender for one week.  He was found to have an abscess in his right middle back on the right. Dr. Katrinka Blazing drained the abscess.  No packing was needed. He was prescribed doxycycline BID for one week and was advised to follow-up.  He has 1-2 more pills left.  He denies fever/chills.  The area around the cyst itches.  He thinks the drainage has decreased - that is what his daughter has said.  He denies pain.      Medications and allergies reviewed with patient and updated if appropriate.  Patient Active Problem List   Diagnosis Date Noted  . Hip flexor tendinitis 05/22/2017  . Abscess of back 05/22/2017  . Pain of both hip joints 04/25/2017  . Dyslipidemia 09/01/2015  . BPH without urinary obstruction 09/01/2015  . DM (diabetes mellitus), type 2, uncontrolled, periph vascular complic (HCC) 07/10/2014  . Essential hypertension 04/20/2009  . CARDIOMYOPATHY, ISCHEMIC 04/20/2009  . LEFT BUNDLE BRANCH BLOCK 04/20/2009  . Chronic systolic heart failure (HCC) 04/20/2009    Current Outpatient Prescriptions on File Prior to Visit  Medication Sig Dispense Refill  . aspirin 325 MG tablet Take 1 tablet (325 mg total) by mouth daily. (Patient taking differently: Take 325 mg by mouth every morning. ) 30 tablet 11  . carvedilol (COREG) 12.5 MG tablet Take 1.5 tablets (18.75 mg total) by mouth 2 (two) times daily. *Pt overdue for an appt, must call and schedule for more refills 3rd attempt 45 tablet 0  . enalapril (VASOTEC) 5 MG tablet TAKE ONE TABLET BY MOUTH TWICE DAILY 60 tablet 11  . glimepiride (AMARYL) 2 MG tablet TAKE ONE TABLET BY MOUTH ONCE DAILY WITH BREAKFAST 90 tablet 1  . metFORMIN (GLUCOPHAGE) 1000 MG tablet Take 1 tablet (1,000 mg total)  by mouth 2 (two) times daily with a meal. 180 tablet 1  . ONE TOUCH ULTRA TEST test strip 1 each by Other route 2 (two) times daily.     Letta Pate DELICA LANCETS 33G MISC 1 each 2 (two) times daily.     . pravastatin (PRAVACHOL) 40 MG tablet Take 1 tablet (40 mg total) by mouth daily. 90 tablet 1  . sildenafil (VIAGRA) 50 MG tablet Take 1 tablet (50 mg total) by mouth daily as needed for erectile dysfunction. 10 tablet 1  . tamsulosin (FLOMAX) 0.4 MG CAPS capsule Take 1 capsule (0.4 mg total) by mouth daily after supper. 90 capsule 1   No current facility-administered medications on file prior to visit.     Past Medical History:  Diagnosis Date  . Coronary artery disease   . Diabetes mellitus (HCC) 07/2014 dx    Past Surgical History:  Procedure Laterality Date  . CORONARY STENT PLACEMENT  1998    Social History   Social History  . Marital status: Married    Spouse name: N/A  . Number of children: N/A  . Years of education: N/A   Social History Main Topics  . Smoking status: Former Smoker    Packs/day: 2.00    Years: 5.00    Types: Cigarettes    Quit date: 12/04/2000  . Smokeless tobacco: Never Used  .  Alcohol use Yes     Comment: weekends  . Drug use: Unknown  . Sexual activity: Not Asked   Other Topics Concern  . None   Social History Narrative   Former smoker - quit 1998 after stent -    Lives with dtr and 2 g-sons   Separated from wife in 2005, good terms   Employed with [employer] - walks at lot at work   No regular exercise    Family History  Problem Relation Age of Onset  . Diabetes Mother   . Diabetes Father   . Stroke Father 31  . Diabetes Sister   . Clotting disorder Sister 37  . Diabetes Other        nephew  . Diabetes Brother   . Diabetes Maternal Grandmother     Review of Systems  Constitutional: Negative for chills and fever.  Skin: Positive for wound. Negative for rash.       Itching around wound         Objective:   Vitals:   05/30/17 1331  BP: 132/78  Pulse: 65  Resp: 16  Temp: 97.2 F (36.2 C)   Filed Weights   05/30/17 1331  Weight: 233 lb (105.7 kg)   Body mass index is 29.52 kg/m.  Wt Readings from Last 3 Encounters:  05/30/17 233 lb (105.7 kg)  05/22/17 231 lb (104.8 kg)  04/25/17 231 lb (104.8 kg)     Physical Exam  Constitutional: He appears well-developed and well-nourished. No distress.  Skin: He is not diaphoretic. There is erythema (mild erythema from bandages - no rash, no redness around wound).  Pin point opening in right middle back with mild induration on lateral aspect, no swelling.  Mild tenderness.  With firm pressure a small amount of pus was extracted followed by minimal blood          Assessment & Plan:   See Problem List for Assessment and Plan of chronic medical problems.

## 2017-06-11 ENCOUNTER — Telehealth: Payer: Self-pay | Admitting: Internal Medicine

## 2017-06-11 DIAGNOSIS — I255 Ischemic cardiomyopathy: Secondary | ICD-10-CM

## 2017-06-11 MED ORDER — CARVEDILOL 12.5 MG PO TABS: 18.7500 mg | ORAL_TABLET | Freq: Two times a day (BID) | ORAL | 0 refills | Status: DC

## 2017-06-11 NOTE — Telephone Encounter (Signed)
Pt's medication was sent to pt's pharmacy as requested. Confirmation received.  °

## 2017-06-11 NOTE — Telephone Encounter (Signed)
New message    *STAT* If patient is at the pharmacy, call can be transferred to refill team.   1. Which medications need to be refilled? (please list name of each medication and dose if known) carvedilol (COREG) 12.5 MG tablet  2. Which pharmacy/location (including street and city if local pharmacy) is medication to be sent to? [facility] [address]  3. Do they need a 30 day or 90 day supply? 90 day supply  Pt made appt for July 24th

## 2017-06-19 ENCOUNTER — Ambulatory Visit: Payer: Managed Care, Other (non HMO) | Admitting: Family Medicine

## 2017-06-26 ENCOUNTER — Encounter (INDEPENDENT_AMBULATORY_CARE_PROVIDER_SITE_OTHER): Payer: Self-pay

## 2017-06-26 ENCOUNTER — Encounter: Payer: Self-pay | Admitting: Internal Medicine

## 2017-06-26 ENCOUNTER — Ambulatory Visit (INDEPENDENT_AMBULATORY_CARE_PROVIDER_SITE_OTHER): Payer: Managed Care, Other (non HMO) | Admitting: Internal Medicine

## 2017-06-26 VITALS — BP 124/62 | HR 57 | Ht 74.5 in | Wt 233.0 lb

## 2017-06-26 DIAGNOSIS — I255 Ischemic cardiomyopathy: Secondary | ICD-10-CM

## 2017-06-26 MED ORDER — ASPIRIN EC 81 MG PO TBEC: 81.0000 mg | DELAYED_RELEASE_TABLET | Freq: Every day | ORAL | 3 refills | Status: DC

## 2017-06-26 NOTE — Patient Instructions (Signed)

## 2017-06-26 NOTE — Progress Notes (Signed)
HPI Karl [name] returns today for CHF followup. He is a pleasant 61 yo man with a h/o chronic systolic heart failure, HTN, and remote substance abuse. Since then he has been stable. His blood pressure is much improved. He denies chest pain, shortness of breath, or syncope. He denies medical or dietary non-compliance. He is still working.  Allergies  Allergen Reactions  . Lipitor [Atorvastatin] Nausea And Vomiting     Current Outpatient Prescriptions  Medication Sig Dispense Refill  . carvedilol (COREG) 12.5 MG tablet Take 1.5 tablets (18.75 mg total) by mouth 2 (two) times daily. 270 tablet 0  . doxycycline (VIBRA-TABS) 100 MG tablet Take 1 tablet (100 mg total) by mouth 2 (two) times daily. 6 tablet 0  . enalapril (VASOTEC) 5 MG tablet TAKE ONE TABLET BY MOUTH TWICE DAILY 60 tablet 11  . glimepiride (AMARYL) 2 MG tablet TAKE ONE TABLET BY MOUTH ONCE DAILY WITH BREAKFAST 90 tablet 1  . metFORMIN (GLUCOPHAGE) 1000 MG tablet Take 1 tablet (1,000 mg total) by mouth 2 (two) times daily with a meal. 180 tablet 1  . ONE TOUCH ULTRA TEST test strip 1 each by Other route 2 (two) times daily.     Letta Pate DELICA LANCETS 33G MISC 1 each 2 (two) times daily.     . pravastatin (PRAVACHOL) 40 MG tablet Take 1 tablet (40 mg total) by mouth daily. 90 tablet 1  . sildenafil (VIAGRA) 50 MG tablet Take 1 tablet (50 mg total) by mouth daily as needed for erectile dysfunction. 10 tablet 1  . tamsulosin (FLOMAX) 0.4 MG CAPS capsule Take 1 capsule (0.4 mg total) by mouth daily after supper. 90 capsule 1  . aspirin EC 81 MG tablet Take 1 tablet (81 mg total) by mouth daily. 90 tablet 3   No current facility-administered medications for this visit.      Past Medical History:  Diagnosis Date  . Coronary artery disease   . Diabetes mellitus (HCC) 07/2014 dx    ROS:   All systems reviewed and negative except as noted in the HPI.   Past Surgical History:  Procedure Laterality Date  . CORONARY STENT  PLACEMENT  1998     Family History  Problem Relation Age of Onset  . Diabetes Mother   . Diabetes Father   . Stroke Father 67  . Diabetes Sister   . Clotting disorder Sister 63  . Diabetes Other        nephew  . Diabetes Brother   . Diabetes Maternal Grandmother      Social History   Social History  . Marital status: Married    Spouse name: N/A  . Number of children: N/A  . Years of education: N/A   Occupational History  . Not on file.   Social History Main Topics  . Smoking status: Former Smoker    Packs/day: 2.00    Years: 5.00    Types: Cigarettes    Quit date: 12/04/2000  . Smokeless tobacco: Never Used  . Alcohol use Yes     Comment: weekends  . Drug use: Unknown  . Sexual activity: Not on file   Other Topics Concern  . Not on file   Social History Narrative   Former smoker - quit 1998 after stent -    Lives with dtr and 2 g-sons   Separated from wife in 2005, good terms   Employed with [employer] - walks at lot at work   No regular  exercise     BP 124/62   Pulse (!) 57   Ht 6' 2.5" (1.892 m)   Wt 233 lb (105.7 kg)   SpO2 97%   BMI 29.52 kg/m   Physical Exam:  Well appearing middle aged man, NAD HEENT: Unremarkable Neck: 6 cm JVD, no thyromegally Lungs:  Clear with no wheezes, rales, or rhonchi HEART:  Regular rate rhythm, no murmurs, no rubs, no clicks,soft S4 gallop Abd:  soft, positive bowel sounds, no organomegally, no rebound, no guarding Ext:  2 plus pulses, no edema, no cyanosis, no clubbing Skin:  No rashes no nodules Neuro:  CN II through XII intact, motor grossly intact  ECG - NSR with LBBB/IVCD  Assess/Plan:  1. Non-ischemic CM - his chronic systolic heart failure appears to be well compensated. He will continue his current meds. He is not interested in ICD insertion at this time. 2. HTN - his blood pressure is elevated today but he notes that it has been better controlled. I have asked that he  uptitrate his coreg. 3. 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  4. Obesity - he is encouraged to lose weight.   Leonia Reeves.D.

## 2017-07-09 NOTE — Progress Notes (Signed)
Subjective:    Patient ID: Karl [name], male    DOB: 30-Sep-1956, 61 y.o.   MRN: [id number]  HPI   Patient Active Problem List   Diagnosis Date Noted  . Hip flexor tendinitis 05/22/2017  . Abscess of back 05/22/2017  . Pain of both hip joints 04/25/2017  . Dyslipidemia 09/01/2015  . BPH without urinary obstruction 09/01/2015  . DM (diabetes mellitus), type 2, uncontrolled, periph vascular complic (HCC) 07/10/2014  . Essential hypertension 04/20/2009  . CARDIOMYOPATHY, ISCHEMIC 04/20/2009  . LEFT BUNDLE BRANCH BLOCK 04/20/2009  . Chronic systolic heart failure (HCC) 04/20/2009    Current Outpatient Prescriptions on File Prior to Visit  Medication Sig Dispense Refill  . aspirin EC 81 MG tablet Take 1 tablet (81 mg total) by mouth daily. 90 tablet 3  . carvedilol (COREG) 12.5 MG tablet Take 1.5 tablets (18.75 mg total) by mouth 2 (two) times daily. 270 tablet 0  . doxycycline (VIBRA-TABS) 100 MG tablet Take 1 tablet (100 mg total) by mouth 2 (two) times daily. 6 tablet 0  . enalapril (VASOTEC) 5 MG tablet TAKE ONE TABLET BY MOUTH TWICE DAILY 60 tablet 11  . glimepiride (AMARYL) 2 MG tablet TAKE ONE TABLET BY MOUTH ONCE DAILY WITH BREAKFAST 90 tablet 1  . metFORMIN (GLUCOPHAGE) 1000 MG tablet Take 1 tablet (1,000 mg total) by mouth 2 (two) times daily with a meal. 180 tablet 1  . ONE TOUCH ULTRA TEST test strip 1 each by Other route 2 (two) times daily.     Letta Pate DELICA LANCETS 33G MISC 1 each 2 (two) times daily.     . pravastatin (PRAVACHOL) 40 MG tablet Take 1 tablet (40 mg total) by mouth daily. 90 tablet 1  . sildenafil (VIAGRA) 50 MG tablet Take 1 tablet (50 mg total) by mouth daily as needed for erectile dysfunction. 10 tablet 1  . tamsulosin (FLOMAX) 0.4 MG CAPS capsule Take 1 capsule (0.4 mg total) by mouth daily after supper. 90 capsule 1   No current facility-administered medications on file prior to visit.     Past Medical History:  Diagnosis Date  .  Coronary artery disease   . Diabetes mellitus (HCC) 07/2014 dx    Past Surgical History:  Procedure Laterality Date  . CORONARY STENT PLACEMENT  1998    Social History   Social History  . Marital status: Married    Spouse name: N/A  . Number of children: N/A  . Years of education: N/A   Social History Main Topics  . Smoking status: Former Smoker    Packs/day: 2.00    Years: 5.00    Types: Cigarettes    Quit date: 12/04/2000  . Smokeless tobacco: Never Used  . Alcohol use Yes     Comment: weekends  . Drug use: Unknown  . Sexual activity: Not on file   Other Topics Concern  . Not on file   Social History Narrative   Former smoker - quit 1998 after stent -    Lives with dtr and 2 g-sons   Separated from wife in 2005, good terms   Employed with [employer] - walks at lot at work   No regular exercise    Family History  Problem Relation Age of Onset  . Diabetes Mother   . Diabetes Father   . Stroke Father 40  . Diabetes Sister   . Clotting disorder Sister 7  . Diabetes Other  nephew  . Diabetes Brother   . Diabetes Maternal Grandmother     Review of Systems     Objective:  There were no vitals filed for this visit. Wt Readings from Last 3 Encounters:  06/26/17 233 lb (105.7 kg)  05/30/17 233 lb (105.7 kg)  05/22/17 231 lb (104.8 kg)   There is no height or weight on file to calculate BMI.   Physical Exam         Assessment & Plan:    See Problem List for Assessment and Plan of chronic medical problems.   This encounter was created in error - please disregard.

## 2017-07-10 ENCOUNTER — Encounter: Payer: Managed Care, Other (non HMO) | Admitting: Internal Medicine

## 2017-08-07 ENCOUNTER — Other Ambulatory Visit: Payer: Self-pay | Admitting: Internal Medicine

## 2017-09-16 ENCOUNTER — Other Ambulatory Visit: Payer: Self-pay | Admitting: Internal Medicine

## 2017-10-16 ENCOUNTER — Other Ambulatory Visit: Payer: Self-pay | Admitting: Internal Medicine

## 2017-10-16 DIAGNOSIS — I255 Ischemic cardiomyopathy: Secondary | ICD-10-CM

## 2017-12-04 DIAGNOSIS — I639 Cerebral infarction, unspecified: Secondary | ICD-10-CM

## 2017-12-04 HISTORY — DX: Cerebral infarction, unspecified: I63.9

## 2017-12-19 ENCOUNTER — Observation Stay (HOSPITAL_COMMUNITY)
Admission: EM | Admit: 2017-12-19 | Discharge: 2017-12-20 | Disposition: A | Discharge status: Home / Self Care | Payer: Managed Care, Other (non HMO) | Attending: Internal Medicine | Admitting: Internal Medicine

## 2017-12-19 ENCOUNTER — Other Ambulatory Visit: Payer: Self-pay

## 2017-12-19 ENCOUNTER — Emergency Department (HOSPITAL_COMMUNITY): Payer: Managed Care, Other (non HMO)

## 2017-12-19 ENCOUNTER — Encounter (HOSPITAL_COMMUNITY): Payer: Self-pay

## 2017-12-19 DIAGNOSIS — E1151 Type 2 diabetes mellitus with diabetic peripheral angiopathy without gangrene: Secondary | ICD-10-CM | POA: Diagnosis not present

## 2017-12-19 DIAGNOSIS — E119 Type 2 diabetes mellitus without complications: Secondary | ICD-10-CM | POA: Diagnosis present

## 2017-12-19 DIAGNOSIS — E1165 Type 2 diabetes mellitus with hyperglycemia: Secondary | ICD-10-CM

## 2017-12-19 DIAGNOSIS — N289 Disorder of kidney and ureter, unspecified: Secondary | ICD-10-CM | POA: Diagnosis not present

## 2017-12-19 DIAGNOSIS — I10 Essential (primary) hypertension: Secondary | ICD-10-CM | POA: Diagnosis present

## 2017-12-19 DIAGNOSIS — I2510 Atherosclerotic heart disease of native coronary artery without angina pectoris: Secondary | ICD-10-CM | POA: Diagnosis not present

## 2017-12-19 DIAGNOSIS — G459 Transient cerebral ischemic attack, unspecified: Secondary | ICD-10-CM

## 2017-12-19 DIAGNOSIS — E1159 Type 2 diabetes mellitus with other circulatory complications: Secondary | ICD-10-CM | POA: Diagnosis not present

## 2017-12-19 DIAGNOSIS — I110 Hypertensive heart disease with heart failure: Secondary | ICD-10-CM | POA: Insufficient documentation

## 2017-12-19 DIAGNOSIS — IMO0002 Reserved for concepts with insufficient information to code with codable children: Secondary | ICD-10-CM

## 2017-12-19 DIAGNOSIS — I5022 Chronic systolic (congestive) heart failure: Secondary | ICD-10-CM | POA: Diagnosis not present

## 2017-12-19 DIAGNOSIS — R531 Weakness: Secondary | ICD-10-CM | POA: Diagnosis present

## 2017-12-19 DIAGNOSIS — Z87891 Personal history of nicotine dependence: Secondary | ICD-10-CM | POA: Insufficient documentation

## 2017-12-19 DIAGNOSIS — Z8673 Personal history of transient ischemic attack (TIA), and cerebral infarction without residual deficits: Secondary | ICD-10-CM

## 2017-12-19 HISTORY — DX: Essential (primary) hypertension: I10

## 2017-12-19 HISTORY — DX: Transient cerebral ischemic attack, unspecified: G45.9

## 2017-12-19 LAB — RAPID URINE DRUG SCREEN, HOSP PERFORMED
Amphetamines: NOT DETECTED
BENZODIAZEPINES: NOT DETECTED
Barbiturates: NOT DETECTED
COCAINE: NOT DETECTED
Opiates: NOT DETECTED
Tetrahydrocannabinol: NOT DETECTED

## 2017-12-19 LAB — URINALYSIS, ROUTINE W REFLEX MICROSCOPIC
Bilirubin Urine: NEGATIVE
GLUCOSE, UA: 250 mg/dL — AB
HGB URINE DIPSTICK: NEGATIVE
Ketones, ur: 15 mg/dL — AB
LEUKOCYTES UA: NEGATIVE
Nitrite: NEGATIVE
Protein, ur: NEGATIVE mg/dL
SPECIFIC GRAVITY, URINE: 1.025 (ref 1.005–1.030)
pH: 6 (ref 5.0–8.0)

## 2017-12-19 LAB — COMPREHENSIVE METABOLIC PANEL
ALT: 11 U/L — ABNORMAL LOW (ref 17–63)
AST: 17 U/L (ref 15–41)
Albumin: 3.7 g/dL (ref 3.5–5.0)
Alkaline Phosphatase: 55 U/L (ref 38–126)
Anion gap: 11 (ref 5–15)
BUN: 17 mg/dL (ref 6–20)
CHLORIDE: 104 mmol/L (ref 101–111)
CO2: 23 mmol/L (ref 22–32)
Calcium: 9.4 mg/dL (ref 8.9–10.3)
Creatinine, Ser: 1.25 mg/dL — ABNORMAL HIGH (ref 0.61–1.24)
GFR calc non Af Amer: 60 mL/min — ABNORMAL LOW (ref >60)
Glucose, Bld: 143 mg/dL — ABNORMAL HIGH (ref 65–99)
POTASSIUM: 3.7 mmol/L (ref 3.5–5.1)
Sodium: 138 mmol/L (ref 135–145)
Total Bilirubin: 0.6 mg/dL (ref 0.3–1.2)
Total Protein: 7.1 g/dL (ref 6.5–8.1)

## 2017-12-19 LAB — DIFFERENTIAL
BASOS ABS: 0 10*3/uL (ref 0.0–0.1)
BASOS PCT: 0 %
EOS ABS: 0.1 10*3/uL (ref 0.0–0.7)
EOS PCT: 2 %
Lymphocytes Relative: 29 %
Lymphs Abs: 1.4 10*3/uL (ref 0.7–4.0)
MONO ABS: 0.6 10*3/uL (ref 0.1–1.0)
Monocytes Relative: 12 %
Neutro Abs: 2.8 10*3/uL (ref 1.7–7.7)
Neutrophils Relative %: 57 %

## 2017-12-19 LAB — I-STAT TROPONIN, ED: TROPONIN I, POC: 0.01 ng/mL (ref 0.00–0.08)

## 2017-12-19 LAB — APTT: APTT: 27 s (ref 24–36)

## 2017-12-19 LAB — CBG MONITORING, ED: GLUCOSE-CAPILLARY: 183 mg/dL — AB (ref 65–99)

## 2017-12-19 LAB — CBC
HCT: 41.3 % (ref 39.0–52.0)
Hemoglobin: 13.6 g/dL (ref 13.0–17.0)
MCH: 26.5 pg (ref 26.0–34.0)
MCHC: 32.9 g/dL (ref 30.0–36.0)
MCV: 80.5 fL (ref 78.0–100.0)
Platelets: 258 10*3/uL (ref 150–400)
RBC: 5.13 MIL/uL (ref 4.22–5.81)
RDW: 13.7 % (ref 11.5–15.5)
WBC: 4.9 10*3/uL (ref 4.0–10.5)

## 2017-12-19 LAB — PROTIME-INR
INR: 1.02
PROTHROMBIN TIME: 13.3 s (ref 11.4–15.2)

## 2017-12-19 LAB — ETHANOL

## 2017-12-19 MED ORDER — STROKE: EARLY STAGES OF RECOVERY BOOK
Freq: Once | Status: AC
  Administered 2017-12-20: 06:00:00
  Filled 2017-12-19: qty 1

## 2017-12-19 MED ORDER — TAMSULOSIN HCL 0.4 MG PO CAPS: 0.4000 mg | ORAL_CAPSULE | Freq: Every day | ORAL | Status: DC

## 2017-12-19 MED ORDER — ACETAMINOPHEN 325 MG PO TABS: 650.0000 mg | ORAL_TABLET | ORAL | Status: DC | PRN

## 2017-12-19 MED ORDER — ACETAMINOPHEN 650 MG RE SUPP: 650.0000 mg | RECTAL | Status: DC | PRN

## 2017-12-19 MED ORDER — ASPIRIN 300 MG RE SUPP: 300.0000 mg | Freq: Every day | RECTAL | Status: DC

## 2017-12-19 MED ORDER — ACETAMINOPHEN 160 MG/5ML PO SOLN: 650.0000 mg | ORAL | Status: DC | PRN

## 2017-12-19 MED ORDER — ASPIRIN 325 MG PO TABS
325.0000 mg | ORAL_TABLET | Freq: Every day | ORAL | Status: DC
  Administered 2017-12-20: 325 mg via ORAL
  Filled 2017-12-19: qty 1

## 2017-12-19 MED ORDER — SENNOSIDES-DOCUSATE SODIUM 8.6-50 MG PO TABS: 1.0000 | ORAL_TABLET | Freq: Every evening | ORAL | Status: DC | PRN

## 2017-12-19 MED ORDER — ENOXAPARIN SODIUM 40 MG/0.4ML ~~LOC~~ SOLN
40.0000 mg | SUBCUTANEOUS | Status: DC
  Administered 2017-12-20: 40 mg via SUBCUTANEOUS
  Filled 2017-12-19: qty 0.4

## 2017-12-19 MED ORDER — INSULIN ASPART 100 UNIT/ML ~~LOC~~ SOLN
0.0000 [IU] | SUBCUTANEOUS | Status: DC
  Administered 2017-12-20: 1 [IU] via SUBCUTANEOUS

## 2017-12-19 MED ORDER — PRAVASTATIN SODIUM 40 MG PO TABS: 40.0000 mg | ORAL_TABLET | Freq: Every day | ORAL | Status: DC

## 2017-12-19 NOTE — ED Triage Notes (Signed)
Pt from home was doing the dishes after dinner when family in the living room kept noticing pt dropping things.  Family reported to EMS that his speech was slurred, left side was weak.  Upon EMS arrival symptoms were Left sided facial droop, left arm and leg weakness and slurred speech.  At 1950 all symptoms spontaneously resolved.  Pt denies any symptoms upon arrival to ED.

## 2017-12-19 NOTE — ED Notes (Signed)
Neurology at bedside for evaluation.

## 2017-12-19 NOTE — ED Notes (Signed)
Re-draw for blue top sent at this time

## 2017-12-19 NOTE — H&P (Signed)
History and Physical    Karl [name] HKF:[id number] DOB: 1956/12/03 DOA: 12/19/2017  PCP: Pincus Sanes, MD   Patient coming from: Home  Chief Complaint: Left arm weakness, left facial droop, slurred speech   HPI: Karl [name] is a 62 y.o. male with medical history significant for hypertension, type 2 diabetes mellitus, and chronic systolic CHF, now presenting to the emergency department for evaluation of acute left arm weakness, left facial droop, and slurred speech.  Patient had been in his usual state of health, was having an uneventful day, and then noted that he kept dropping a glass that he was trying to wash.  He reports holding the glass in his left hand, but it kept slipping out.  He realized he was having left arm weakness, raised these concerns with family, who noted that he had a left facial droop and was slurring his words.  He denies any headache, change in vision or hearing, chest pain, or palpitations.  No recent fall or trauma.  Denies any recent alcohol or illicit substance use.  Has never experienced similar symptoms.  Patient called EMS and reports that symptoms began to improve by the time of their arrival.  ED Course: Upon arrival to the ED, patient is found to be afebrile, saturating well on room air, and with vitals otherwise normal.  EKG features a sinus rhythm with RBBB and LVH.  Noncontrast head CT is negative for acute intracranial abnormality.  Chemistry panel reveals a creatinine of 1.25, slightly higher than priors.  CBC is unremarkable.  UDS and urinalysis are abnormal troponin and INR are within normal limits, and ethanol level is undetectable.  Symptoms have completely resolved.  Neurology was consulted by ED physician and recommends a medical admission for further evaluation and management suspected acute stroke/TIA.  Review of Systems:  All other systems reviewed and apart from HPI, are negative.  Past Medical History:  Diagnosis Date  . Coronary artery  disease   . Diabetes mellitus (HCC) 07/2014 dx  . Hypertension     Past Surgical History:  Procedure Laterality Date  . CORONARY STENT PLACEMENT  1998     reports that he quit smoking about 17 years ago. His smoking use included cigarettes. He has a 10.00 pack-year smoking history. he has never used smokeless tobacco. He reports that he drinks alcohol. His drug history is not on file.  Allergies  Allergen Reactions  . Lipitor [Atorvastatin] Nausea And Vomiting    Family History  Problem Relation Age of Onset  . Diabetes Mother   . Diabetes Father   . Stroke Father 86  . Diabetes Sister   . Clotting disorder Sister 61  . Diabetes Other        nephew  . Diabetes Brother   . Diabetes Maternal Grandmother      Prior to Admission medications   Medication Sig Start Date End Date Taking? Authorizing Provider  aspirin EC 81 MG tablet Take 1 tablet (81 mg total) by mouth daily. Patient taking differently: Take 81 mg by mouth 2 (two) times daily.  06/26/17  Yes Marinus Maw, MD  carvedilol (COREG) 12.5 MG tablet TAKE 1 & 1/2 (ONE & ONE-HALF) TABLETS BY MOUTH TWICE DAILY 10/16/17  Yes Marinus Maw, MD  enalapril (VASOTEC) 5 MG tablet TAKE ONE TABLET BY MOUTH TWICE DAILY 04/19/17  Yes Marinus Maw, MD  glimepiride (AMARYL) 2 MG tablet TAKE ONE TABLET BY MOUTH ONCE DAILY WITH BREAKFAST 08/08/17  Yes Burns,  Bobette Mo, MD  metFORMIN (GLUCOPHAGE) 1000 MG tablet Take 1 tablet (1,000 mg total) by mouth 2 (two) times daily with a meal. --- Office visit needed for further refills 09/17/17  Yes Burns, Bobette Mo, MD  pravastatin (PRAVACHOL) 40 MG tablet Take 1 tablet (40 mg total) by mouth daily. --- Office visit needed for further refills 09/17/17  Yes Burns, Bobette Mo, MD  sildenafil (VIAGRA) 50 MG tablet Take 1 tablet (50 mg total) by mouth daily as needed for erectile dysfunction. 03/16/15  Yes Marinus Maw, MD  tamsulosin (FLOMAX) 0.4 MG CAPS capsule Take 1 capsule (0.4 mg total) by mouth  daily after supper. 01/10/17  Yes Burns, Bobette Mo, MD  ONE TOUCH ULTRA TEST test strip 1 each by Other route 2 (two) times daily.  08/05/14   [provider]  Dola Argyle LANCETS 33G MISC 1 each 2 (two) times daily.  08/07/14   [provider]    Physical Exam: Vitals:   12/19/17 2230 12/19/17 2245 12/19/17 2300 12/19/17 2315  BP: (!) 178/100 (!) 168/87 (!) 155/96 (!) 153/103  Pulse: 77 81 73 75  Resp: (!) 22 16 (!) 24 19  Temp:      TempSrc:      SpO2: 99% 97% 97% 99%  Weight:      Height:          Constitutional: NAD, calm, comfortable Eyes: PERTLA, lids and conjunctivae normal ENMT: Mucous membranes are moist. Posterior pharynx clear of any exudate or lesions.   Neck: normal, supple, no masses, no thyromegaly Respiratory: clear to auscultation bilaterally, no wheezing, no crackles. Normal respiratory effort.   Cardiovascular: S1 & S2 heard, regular rate and rhythm. No significant JVD. Abdomen: No distension, no tenderness, no masses palpated. Bowel sounds normal.  Musculoskeletal: no clubbing / cyanosis. No joint deformity upper and lower extremities.   Skin: no significant rashes, lesions, ulcers. Warm, dry, well-perfused. Neurologic: CN 2-12 grossly intact. Sensation intact. Strength 5/5 in all 4 limbs.  Psychiatric: Alert and oriented x 3. Pleasant, cooperative.     Labs on Admission: I have personally reviewed following labs and imaging studies  CBC: Recent Labs  Lab 12/19/17 2130  WBC 4.9  NEUTROABS 2.8  HGB 13.6  HCT 41.3  MCV 80.5  PLT 258   Basic Metabolic Panel: Recent Labs  Lab 12/19/17 2130  NA 138  K 3.7  CL 104  CO2 23  GLUCOSE 143*  BUN 17  CREATININE 1.25*  CALCIUM 9.4   GFR: Estimated Creatinine Clearance: 77.9 mL/min (A) (by C-G formula based on SCr of 1.25 mg/dL (H)). Liver Function Tests: Recent Labs  Lab 12/19/17 2130  AST 17  ALT 11*  ALKPHOS 55  BILITOT 0.6  PROT 7.1  ALBUMIN 3.7   No results for  input(s): LIPASE, AMYLASE in the last 168 hours. No results for input(s): AMMONIA in the last 168 hours. Coagulation Profile: Recent Labs  Lab 12/19/17 2253  INR 1.02   Cardiac Enzymes: No results for input(s): CKTOTAL, CKMB, CKMBINDEX, TROPONINI in the last 168 hours. BNP (last 3 results) No results for input(s): PROBNP in the last 8760 hours. HbA1C: No results for input(s): HGBA1C in the last 72 hours. CBG: Recent Labs  Lab 12/19/17 2034  GLUCAP 183*   Lipid Profile: No results for input(s): CHOL, HDL, LDLCALC, TRIG, CHOLHDL, LDLDIRECT in the last 72 hours. Thyroid Function Tests: No results for input(s): TSH, T4TOTAL, FREET4, T3FREE, THYROIDAB in the last 72 hours. Anemia Panel:  No results for input(s): VITAMINB12, FOLATE, FERRITIN, TIBC, IRON, RETICCTPCT in the last 72 hours. Urine analysis:    Component Value Date/Time   COLORURINE YELLOW 12/19/2017 2239   APPEARANCEUR CLEAR 12/19/2017 2239   LABSPEC 1.025 12/19/2017 2239   PHURINE 6.0 12/19/2017 2239   GLUCOSEU 250 (A) 12/19/2017 2239   HGBUR NEGATIVE 12/19/2017 2239   BILIRUBINUR NEGATIVE 12/19/2017 2239   BILIRUBINUR moderate 07/09/2014 1428   KETONESUR 15 (A) 12/19/2017 2239   PROTEINUR NEGATIVE 12/19/2017 2239   UROBILINOGEN 0.2 08/07/2015 0854   NITRITE NEGATIVE 12/19/2017 2239   LEUKOCYTESUR NEGATIVE 12/19/2017 2239   Sepsis Labs: @LABRCNTIP (procalcitonin:4,lacticidven:4) )No results found for this or any previous visit (from the past 240 hour(s)).   Radiological Exams on Admission: Ct Head Wo Contrast  Result Date: 12/19/2017 CLINICAL DATA:  Neurological deficits. Left-sided weakness. Slurred speech. EXAM: CT HEAD WITHOUT CONTRAST TECHNIQUE: Contiguous axial images were obtained from the base of the skull through the vertex without intravenous contrast. COMPARISON:  None. FINDINGS: Brain: No evidence of acute infarction, hemorrhage, hydrocephalus, extra-axial collection or mass lesion/mass effect.  Vascular: No hyperdense vessel or unexpected calcification. Skull: No osseous abnormality. Sinuses/Orbits: Visualized paranasal sinuses are clear. Visualized mastoid sinuses are clear. Visualized orbits demonstrate no focal abnormality. Other: None IMPRESSION: No acute intracranial pathology. Electronically Signed   By: Elige Ko   On: 12/19/2017 21:54    EKG: Independently reviewed. Sinus rhythm, RBBB, LVH.   Assessment/Plan  1. Acute left-sided weakness  - Presents with acute-onset of LUE weakness, left facial weakness, and dysarthria  - Sxs began to resolve by time of EMS arrival and he reports being back to baseline by time of admission  - Head CT negative for acute intracranial abnormality and blood work largely unremarkable  - Neurology is consulting - Continue cardiac monitoring, frequent neuro checks, obtain MRI brain, MRA head, carotid dopplers, echocardiogram, fasting lipid panel, and A1c  - Continue statin; aspirin increased to 325 mg pending neuro recs   2. Chronic systolic CHF  - EF 20-25% on remote echo  - Appears well-compensated  - Coreg and enalapril held initially in acute-phase of suspected CVA  - Update echocardiogram as above    3. Type II DM  - A1c was 6.9% a year ago  - Managed at home with glimepiride and metformin, held on admission  - Follow CBG's and start SSI with Novolog   4. Hypertension  - BP modestly elevated in ED  - Permit HTN in acute-phase of suspected ischemic CVA  - Resume Coreg and enalapril as appropriate     DVT prophylaxis: Lovenox  Code Status: Full  Family Communication: Daughter updated at bedside Disposition Plan: Observe on telemetry Consults called: Neurology Admission status: Observation    Briscoe Deutscher, MD [facility] Pager [phone]  If 7PM-7AM, please contact night-coverage [email or web address] Password [facility]  12/19/2017, 11:39 PM

## 2017-12-19 NOTE — ED Provider Notes (Signed)
Emergency Department Provider Note   I have reviewed the triage vital signs and the nursing notes.   HISTORY  Chief Complaint Weakness   HPI Karl [name] is a 62 y.o. male with PMH of CAD, DM, and HTN since to the emergency department for evaluation of acute onset left upper extremity weakness, slurred speech, face droop.  Symptoms began at approximately 7:30 this evening.  Patient was rinsing a glass when he continually dropped it.  Family noticed that his speech was slurred and he had some droop on the left face.  Paramedics were called who noticed a similar presentation.  In route the patient's symptoms completely resolved and he was not made a code stroke on arrival.  Family state that he is back to his normal self.  The patient denies any weakness or numbness currently.  No prior history of stroke or TIA.  No history of atrial fibrillation. No radiation of symptoms. No modifying factors.    Past Medical History:  Diagnosis Date  . Coronary artery disease   . Diabetes mellitus (HCC) 07/2014 dx  . Hypertension     Patient Active Problem List   Diagnosis Date Noted  . Coronary artery disease 12/19/2017  . Mild renal insufficiency 12/19/2017  . TIA (transient ischemic attack) 12/19/2017  . Hip flexor tendinitis 05/22/2017  . Abscess of back 05/22/2017  . Pain of both hip joints 04/25/2017  . Dyslipidemia 09/01/2015  . BPH without urinary obstruction 09/01/2015  . DM (diabetes mellitus), type 2, uncontrolled, periph vascular complic (HCC) 07/10/2014  . Essential hypertension 04/20/2009  . CARDIOMYOPATHY, ISCHEMIC 04/20/2009  . LEFT BUNDLE BRANCH BLOCK 04/20/2009  . Chronic systolic heart failure (HCC) 04/20/2009    Past Surgical History:  Procedure Laterality Date  . CORONARY STENT PLACEMENT  1998    Current Outpatient Rx  . Order #: [id number] Class: Normal  . Order #: [id number] Class: Normal  . Order #: [id number] Class: Normal  . Order #: [id number] Class: Normal  .  Order #: [id number] Class: Normal  . Order #: [id number] Class: Normal  . Order #: [id number] Class: Normal  . Order #: [id number] Class: Normal  . Order #: [id number] Class: Historical Med  . Order #: [id number] Class: Historical Med    Allergies Lipitor [atorvastatin]  Family History  Problem Relation Age of Onset  . Diabetes Mother   . Diabetes Father   . Stroke Father 84  . Diabetes Sister   . Clotting disorder Sister 90  . Diabetes Other        nephew  . Diabetes Brother   . Diabetes Maternal Grandmother     Social History Social History   Tobacco Use  . Smoking status: Former Smoker    Packs/day: 2.00    Years: 5.00    Pack years: 10.00    Types: Cigarettes    Last attempt to quit: 12/04/2000    Years since quitting: 17.0  . Smokeless tobacco: Never Used  Substance Use Topics  . Alcohol use: Yes    Comment: weekends  . Drug use: Not on file    Review of Systems  Constitutional: No fever/chills Eyes: No visual changes. ENT: No sore throat. Cardiovascular: Denies chest pain. Respiratory: Denies shortness of breath. Gastrointestinal: No abdominal pain.  No nausea, no vomiting.  No diarrhea.  No constipation. Genitourinary: Negative for dysuria. Musculoskeletal: Negative for back pain. Skin: Negative for rash. Neurological: Negative for headaches. Positive left face and LUE weakness with slurred speech, now resolved.   10-point ROS otherwise negative.  ____________________________________________  PHYSICAL EXAM:  VITAL SIGNS: ED Triage Vitals  Enc Vitals Group     BP 12/19/17 2029 (!) 166/88     Pulse Rate 12/19/17 2029 80     Resp 12/19/17 2029 18     Temp 12/19/17 2029 98.3 F (36.8 C)     Temp Source 12/19/17 2029 Oral     SpO2 12/19/17 2029 98 %     Weight 12/19/17 2030 230 lb (104.3 kg)     Height 12/19/17 2030 6\' 2"  (1.88 m)    Constitutional: Alert and oriented. Well appearing and in no acute distress. Eyes: Conjunctivae are normal. Head:  Atraumatic. Nose: No congestion/rhinnorhea. Mouth/Throat: Mucous membranes are moist.   Neck: No stridor Cardiovascular: Normal rate, regular rhythm. Good peripheral circulation. Grossly normal heart sounds.   Respiratory: Normal respiratory effort.  No retractions. Lungs CTAB. Gastrointestinal: Soft and nontender. No distention.  Musculoskeletal: No lower extremity tenderness nor edema. No gross deformities of extremities. Neurologic:  Normal speech and language. No gross focal neurologic deficits are appreciated. No pronator drift. Normal CN exam 2-12.  Skin:  Skin is warm, dry and intact. No rash noted.  ____________________________________________   LABS (all labs ordered are listed, but only abnormal results are displayed)  Labs Reviewed  COMPREHENSIVE METABOLIC PANEL - Abnormal; Notable for the following components:      Result Value   Glucose, Bld 143 (*)    Creatinine, Ser 1.25 (*)    ALT 11 (*)    GFR calc non Af Amer 60 (*)    All other components within normal limits  URINALYSIS, ROUTINE W REFLEX MICROSCOPIC - Abnormal; Notable for the following components:   Glucose, UA 250 (*)    Ketones, ur 15 (*)    All other components within normal limits  CBG MONITORING, ED - Abnormal; Notable for the following components:   Glucose-Capillary 183 (*)    All other components within normal limits  ETHANOL  CBC  DIFFERENTIAL  RAPID URINE DRUG SCREEN, HOSP PERFORMED  PROTIME-INR  APTT  HIV ANTIBODY (ROUTINE TESTING)  HEMOGLOBIN A1C  LIPID PANEL  I-STAT TROPONIN, ED   ____________________________________________  EKG   EKG Interpretation  Date/Time:  Wednesday December 19 2017 20:31:53 EST Ventricular Rate:  79 PR Interval:    QRS Duration: 186 QT Interval:  442 QTC Calculation: 507 R Axis:   1 Text Interpretation:  Sinus rhythm Right bundle branch block Left ventricular hypertrophy No STEMI.  Confirmed by Alona Bene [phone]) on 12/19/2017 8:41:42 PM        ____________________________________________  RADIOLOGY  Ct Head Wo Contrast  Result Date: 12/19/2017 CLINICAL DATA:  Neurological deficits. Left-sided weakness. Slurred speech. EXAM: CT HEAD WITHOUT CONTRAST TECHNIQUE: Contiguous axial images were obtained from the base of the skull through the vertex without intravenous contrast. COMPARISON:  None. FINDINGS: Brain: No evidence of acute infarction, hemorrhage, hydrocephalus, extra-axial collection or mass lesion/mass effect. Vascular: No hyperdense vessel or unexpected calcification. Skull: No osseous abnormality. Sinuses/Orbits: Visualized paranasal sinuses are clear. Visualized mastoid sinuses are clear. Visualized orbits demonstrate no focal abnormality. Other: None IMPRESSION: No acute intracranial pathology. Electronically Signed   By: Elige Ko   On: 12/19/2017 21:54    ____________________________________________   PROCEDURES  Procedure(s) performed:   Procedures  None ____________________________________________   INITIAL IMPRESSION / ASSESSMENT AND PLAN / ED COURSE  Pertinent labs & imaging results that were available during my care of the patient were reviewed by me and considered in my medical decision making (  see chart for details).  Patient presents to the emergency department for evaluation after strokelike symptoms.  Symptoms resolved by the time the patient arrived to the emergency department.  On my exam the patient has no neurological deficits.  I consulted neurology immediately given his acute presentation and resolution of symptoms.  Blood sugar was normal. Dr. Lyman Bishop will see the patient and place CT angio orders as deemed necessary after evaluation.   Discussed patient's case with Hosptialist, Dr. Antionette Char to request admission. Patient and family (if present) updated with plan. Care transferred to Hospitalist service.  I reviewed all nursing notes, vitals, pertinent old records, EKGs, labs, imaging (as  available).  ____________________________________________  FINAL CLINICAL IMPRESSION(S) / ED DIAGNOSES  Final diagnoses:  TIA (transient ischemic attack)     MEDICATIONS GIVEN DURING THIS VISIT:  Medications  pravastatin (PRAVACHOL) tablet 40 mg (not administered)  tamsulosin (FLOMAX) capsule 0.4 mg (not administered)   stroke: mapping our early stages of recovery book (not administered)  acetaminophen (TYLENOL) tablet 650 mg (not administered)    Or  acetaminophen (TYLENOL) solution 650 mg (not administered)    Or  acetaminophen (TYLENOL) suppository 650 mg (not administered)  senna-docusate (Senokot-S) tablet 1 tablet (not administered)  insulin aspart (novoLOG) injection 0-9 Units (not administered)  enoxaparin (LOVENOX) injection 40 mg (not administered)  aspirin suppository 300 mg (not administered)    Or  aspirin tablet 325 mg (not administered)    Note:  This document was prepared using Dragon voice recognition software and may include unintentional dictation errors.  Alona Bene, MD Emergency Medicine    Baby Gieger, Arlyss Repress, MD 12/20/17 [phone]

## 2017-12-19 NOTE — ED Notes (Signed)
Patient transported to CT 

## 2017-12-20 ENCOUNTER — Observation Stay (HOSPITAL_BASED_OUTPATIENT_CLINIC_OR_DEPARTMENT_OTHER): Payer: Managed Care, Other (non HMO)

## 2017-12-20 ENCOUNTER — Other Ambulatory Visit: Payer: Self-pay

## 2017-12-20 ENCOUNTER — Observation Stay (HOSPITAL_COMMUNITY): Payer: Managed Care, Other (non HMO)

## 2017-12-20 DIAGNOSIS — I10 Essential (primary) hypertension: Secondary | ICD-10-CM

## 2017-12-20 DIAGNOSIS — I5022 Chronic systolic (congestive) heart failure: Secondary | ICD-10-CM | POA: Diagnosis not present

## 2017-12-20 DIAGNOSIS — I63411 Cerebral infarction due to embolism of right middle cerebral artery: Secondary | ICD-10-CM

## 2017-12-20 DIAGNOSIS — G459 Transient cerebral ischemic attack, unspecified: Secondary | ICD-10-CM | POA: Diagnosis not present

## 2017-12-20 DIAGNOSIS — I639 Cerebral infarction, unspecified: Secondary | ICD-10-CM

## 2017-12-20 DIAGNOSIS — E1151 Type 2 diabetes mellitus with diabetic peripheral angiopathy without gangrene: Secondary | ICD-10-CM | POA: Diagnosis not present

## 2017-12-20 DIAGNOSIS — E785 Hyperlipidemia, unspecified: Secondary | ICD-10-CM

## 2017-12-20 DIAGNOSIS — E1165 Type 2 diabetes mellitus with hyperglycemia: Secondary | ICD-10-CM

## 2017-12-20 DIAGNOSIS — N289 Disorder of kidney and ureter, unspecified: Secondary | ICD-10-CM

## 2017-12-20 DIAGNOSIS — Z8673 Personal history of transient ischemic attack (TIA), and cerebral infarction without residual deficits: Secondary | ICD-10-CM

## 2017-12-20 DIAGNOSIS — I340 Nonrheumatic mitral (valve) insufficiency: Secondary | ICD-10-CM | POA: Diagnosis not present

## 2017-12-20 LAB — LIPID PANEL
CHOLESTEROL: 160 mg/dL (ref 0–200)
HDL: 50 mg/dL (ref >40)
LDL Cholesterol: 88 mg/dL (ref 0–99)
TRIGLYCERIDES: 112 mg/dL (ref <150)
Total CHOL/HDL Ratio: 3.2 RATIO
VLDL: 22 mg/dL (ref 0–40)

## 2017-12-20 LAB — HIV ANTIBODY (ROUTINE TESTING W REFLEX): HIV SCREEN 4TH GENERATION: NONREACTIVE

## 2017-12-20 LAB — GLUCOSE, CAPILLARY
Glucose-Capillary: 128 mg/dL — ABNORMAL HIGH (ref 65–99)
Glucose-Capillary: 125 mg/dL — ABNORMAL HIGH (ref 65–99)
Glucose-Capillary: 116 mg/dL — ABNORMAL HIGH (ref 65–99)

## 2017-12-20 LAB — CBG MONITORING, ED: Glucose-Capillary: 105 mg/dL — ABNORMAL HIGH (ref 65–99)

## 2017-12-20 LAB — ECHOCARDIOGRAM COMPLETE
Height: 74.5 in
Weight: 3580.8 oz

## 2017-12-20 LAB — HEMOGLOBIN A1C
Hgb A1c MFr Bld: 6.5 % — ABNORMAL HIGH (ref 4.8–5.6)
Mean Plasma Glucose: 139.85 mg/dL

## 2017-12-20 MED ORDER — CLOPIDOGREL BISULFATE 75 MG PO TABS: 75.0000 mg | ORAL_TABLET | Freq: Every day | ORAL | 0 refills | Status: DC

## 2017-12-20 MED ORDER — PRAVASTATIN SODIUM 80 MG PO TABS: 80.0000 mg | ORAL_TABLET | Freq: Every day | ORAL | 0 refills | Status: DC

## 2017-12-20 MED ORDER — GLIMEPIRIDE 4 MG PO TABS: 2.0000 mg | ORAL_TABLET | Freq: Every day | ORAL | Status: DC

## 2017-12-20 MED ORDER — METFORMIN HCL 500 MG PO TABS: 1000.0000 mg | ORAL_TABLET | Freq: Two times a day (BID) | ORAL | Status: DC

## 2017-12-20 MED ORDER — PRAVASTATIN SODIUM 40 MG PO TABS: 80.0000 mg | ORAL_TABLET | Freq: Every day | ORAL | Status: DC

## 2017-12-20 MED ORDER — PERFLUTREN LIPID MICROSPHERE
1.0000 mL | INTRAVENOUS | Status: AC | PRN
  Administered 2017-12-20: 2 mL via INTRAVENOUS
  Filled 2017-12-20: qty 10

## 2017-12-20 MED ORDER — ASPIRIN 325 MG PO TABS: 325.0000 mg | ORAL_TABLET | Freq: Every day | ORAL | 0 refills | Status: DC

## 2017-12-20 NOTE — Progress Notes (Signed)
Handoff report given to Angela,RN

## 2017-12-20 NOTE — Progress Notes (Signed)
OT Cancellation Note  Patient Details Name: ELISHA MONTEL MRN: [id number] DOB: 05-29-1956   Cancelled Treatment:    Reason Eval/Treat Not Completed: OT screened, no needs identified, will sign off  [facility], OT/L  [phone] 12/20/2017 12/20/2017, 2:12 PM

## 2017-12-20 NOTE — Progress Notes (Signed)
  Echocardiogram 2D Echocardiogram has been performed.  Zakia Sainato L Androw 12/20/2017, 11:07 AM

## 2017-12-20 NOTE — Discharge Summary (Addendum)
Physician Discharge Summary  Karl [name] OTL:[id number] DOB: September 10, 1956 DOA: 12/19/2017  PCP: Pincus Sanes, MD  Admit date: 12/19/2017 Discharge date: 12/20/2017   Recommendations for Outpatient Follow-Up:   1. Being d/c'd on ASA/plavix-- will need to discuss starting warfarin with Dr. Ladona [name] at appointment next week--  See note from Dr. Roda Shutters 2. Patient bringing log of blood sugars to PCP 3. Also will need 30 day event monitor   Discharge Diagnosis:   Active Problems:   Essential hypertension   Chronic systolic heart failure (HCC)   DM (diabetes mellitus), type 2, uncontrolled, periph vascular complic (HCC)   Coronary artery disease   Mild renal insufficiency   CVA (cerebral vascular accident) [facility])   Discharge disposition:  Home:  Discharge Condition: Improved.  Diet recommendation: Low sodium, heart healthy.  Carbohydrate-modified  Wound care: None.   History of Present Illness:   Karl [name] is a 62 y.o. male with medical history significant for hypertension, type 2 diabetes mellitus, and chronic systolic CHF, now presenting to the emergency department for evaluation of acute left arm weakness, left facial droop, and slurred speech.  Patient had been in his usual state of health, was having an uneventful day, and then noted that he kept dropping a glass that he was trying to wash.  He reports holding the glass in his left hand, but it kept slipping out.  He realized he was having left arm weakness, raised these concerns with family, who noted that he had a left facial droop and was slurring his words.  He denies any headache, change in vision or hearing, chest pain, or palpitations.  No recent fall or trauma.  Denies any recent alcohol or illicit substance use.  Has never experienced similar symptoms.  Patient called EMS and reports that symptoms began to improve by the time of their arrival.     Hospital Course by Problem:   Acute left-sided weakness -Presents  with acute-onset of LUE weakness, left facial weakness, and dysarthria -MRI: Faint 10 mm focus of reduced diffusion in the right mid posterior corona radiata compatible with acute infarct. No hemorrhage or mass effect. -B/L Carotid U/S:1% to 39% ICA stenosis. Vertebral artey flow is antegrade -Outpatient follow up withCardiology, Dr Karl [name] NEURO RECS: Recommend AC therapywith Coumadinfor 3 months, then repeat ECHO to re- evaluate EFand continued need for [facility] therapy---this is to be discussed with Dr. Ladona [name] atappointment next week -ASA 325/plavix -Arrange 30 day event monitor at discharge -statin increased   Chronic systolic CHF -EF 97-41% on remote echo-- similar on this echo -Appears well-compensated -Coreg and enalapril held initially in acute-phase of suspected CVA- resume over the next 24 hours   Type II DM -A1c was 6.9% a year ago-- slightly improved this visit -Managed at home with glimepiride and metformin -resume home meds -patient to bring log of blood sugars to PCP  Hypertension -BP modestly elevated in ED -Permit HTN in acute-phase of suspected ischemic CVA -Resume Coreg and enalapril as appropriate       Medical Consultants:    neuro.   Discharge Exam:   Vitals:   12/20/17 1100 12/20/17 1335  BP: (!) 139/93 (!) 157/78  Pulse: 65 65  Resp: 20 20  Temp:  98 F (36.7 C)  SpO2: 98% 100%   Vitals:   12/20/17 0705 12/20/17 0930 12/20/17 1100 12/20/17 1335  BP: 133/85 (!) 150/88 (!) 139/93 (!) 157/78  Pulse: 61 62 65 65  Resp: 20 16 20  20  Temp:  98.4 F (36.9 C)  98 F (36.7 C)  TempSrc:  Oral  Oral  SpO2: 100% 97% 98% 100%  Weight:      Height:        Gen:  NAD    The results of significant diagnostics from this hospitalization (including imaging, microbiology, ancillary and laboratory) are listed below for reference.     Procedures and Diagnostic Studies:   Ct Head Wo Contrast  Result Date:  12/19/2017 CLINICAL DATA:  Neurological deficits. Left-sided weakness. Slurred speech. EXAM: CT HEAD WITHOUT CONTRAST TECHNIQUE: Contiguous axial images were obtained from the base of the skull through the vertex without intravenous contrast. COMPARISON:  None. FINDINGS: Brain: No evidence of acute infarction, hemorrhage, hydrocephalus, extra-axial collection or mass lesion/mass effect. Vascular: No hyperdense vessel or unexpected calcification. Skull: No osseous abnormality. Sinuses/Orbits: Visualized paranasal sinuses are clear. Visualized mastoid sinuses are clear. Visualized orbits demonstrate no focal abnormality. Other: None IMPRESSION: No acute intracranial pathology. Electronically Signed   By: Karl [name]   On: 12/19/2017 21:54   Mr Brain Wo Contrast  Result Date: 12/20/2017 CLINICAL DATA:  62 y/o M; acute left arm weakness, left facial droop, and slurred speech. EXAM: MRI HEAD WITHOUT CONTRAST MRA HEAD WITHOUT CONTRAST TECHNIQUE: Multiplanar, multiecho pulse sequences of the brain and surrounding structures were obtained without intravenous contrast. Angiographic images of the head were obtained using MRA technique without contrast. COMPARISON:  12/19/2017 CT head FINDINGS: MRI HEAD FINDINGS Brain: 10 mm focus of faint reduced diffusion within the right mid posterior corona radiata (series 4, image 31 and series 450, image 31). Signal abnormality on other sequences not present, but there is low ADC indicating acute infarction. No hemorrhage or mass effect. Left cerebellar hemisphere small chronic infarction. Fewnonspecific foci of T2 FLAIR hyperintense signal abnormality in subcortical and periventricular white matter are compatible withmildchronic microvascular ischemic changes for age. Mildbrain parenchymal volume loss. No hydrocephalus, extra-axial collection, or effacement of basilar cisterns. Vascular: As below. Skull and upper cervical spine: Normal marrow signal. Sinuses/Orbits: Mild left  maxillary sinus mucosal thickening. Otherwise negative. Other: None. MRA HEAD FINDINGS Internal carotid arteries:  Patent. Anterior cerebral arteries:  Patent. Middle cerebral arteries: Patent bilateral M1 and proximal M2 segments. Short segment of occlusion of a insular branch of the M2 inferior division (series 552, image 16 and series 5, image 60) with distal reconstitution. Anterior communicating artery: Patent. Posterior communicating arteries: Patent left. No right identified, likely hypoplastic or absent. Posterior cerebral arteries:  Patent. Basilar artery:  Patent. Vertebral arteries:  Patent. No additional evidence of high-grade stenosis, large vessel occlusion, or aneurysm unless noted above. IMPRESSION: 1. Faint 10 mm focus of reduced diffusion in the right mid posterior corona radiata compatible with acute infarct. No hemorrhage or mass effect. 2. Short segment of occlusion of a insular branch of right M2 inferior division with distal reconstitution. Otherwise unremarkable MRA of the head. 3. 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 4. Mild chronic microvascular ischemic changes and mild parenchymal volume loss of the brain. These results were called by telephone at the time of interpretation on 12/20/2017 at 1:07 am to Dr. Blinda Leatherwood, who verbally acknowledged these results. Electronically Signed   By: Mitzi Hansen M.D.   On: 12/20/2017 01:11   Mr Shirlee Latch ZO Contrast  Result Date: 12/20/2017 CLINICAL DATA:  62 y/o M; acute left arm weakness, left facial droop, and slurred speech. EXAM: MRI HEAD WITHOUT CONTRAST MRA HEAD WITHOUT CONTRAST TECHNIQUE: Multiplanar, multiecho pulse sequences  of the brain and surrounding structures were obtained without intravenous contrast. Angiographic images of the head were obtained using MRA technique without contrast. COMPARISON:  12/19/2017 CT head FINDINGS: MRI HEAD FINDINGS Brain: 10 mm focus of faint reduced diffusion within the right mid  posterior corona radiata (series 4, image 31 and series 450, image 31). Signal abnormality on other sequences not present, but there is low ADC indicating acute infarction. No hemorrhage or mass effect. Left cerebellar hemisphere small chronic infarction. Fewnonspecific foci of T2 FLAIR hyperintense signal abnormality in subcortical and periventricular white matter are compatible withmildchronic microvascular ischemic changes for age. Mildbrain parenchymal volume loss. No hydrocephalus, extra-axial collection, or effacement of basilar cisterns. Vascular: As below. Skull and upper cervical spine: Normal marrow signal. Sinuses/Orbits: Mild left maxillary sinus mucosal thickening. Otherwise negative. Other: None. MRA HEAD FINDINGS Internal carotid arteries:  Patent. Anterior cerebral arteries:  Patent. Middle cerebral arteries: Patent bilateral M1 and proximal M2 segments. Short segment of occlusion of a insular branch of the M2 inferior division (series 552, image 16 and series 5, image 60) with distal reconstitution. Anterior communicating artery: Patent. Posterior communicating arteries: Patent left. No right identified, likely hypoplastic or absent. Posterior cerebral arteries:  Patent. Basilar artery:  Patent. Vertebral arteries:  Patent. No additional evidence of high-grade stenosis, large vessel occlusion, or aneurysm unless noted above. IMPRESSION: 1. Faint 10 mm focus of reduced diffusion in the right mid posterior corona radiata compatible with acute infarct. No hemorrhage or mass effect. 2. Short segment of occlusion of a insular branch of right M2 inferior division with distal reconstitution. Otherwise unremarkable MRA of the head. 3. 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 4. Mild chronic microvascular ischemic changes and mild parenchymal volume loss of the brain. These results were called by telephone at the time of interpretation on 12/20/2017 at 1:07 am to Dr. Blinda Leatherwood, who verbally  acknowledged these results. Electronically Signed   By: Mitzi Hansen M.D.   On: 12/20/2017 01:11     Labs:   Basic Metabolic Panel: Recent Labs  Lab 12/19/17 2130  NA 138  K 3.7  CL 104  CO2 23  GLUCOSE 143*  BUN 17  CREATININE 1.25*  CALCIUM 9.4   GFR Estimated Creatinine Clearance: 77.5 mL/min (A) (by C-G formula based on SCr of 1.25 mg/dL (H)). Liver Function Tests: Recent Labs  Lab 12/19/17 2130  AST 17  ALT 11*  ALKPHOS 55  BILITOT 0.6  PROT 7.1  ALBUMIN 3.7   No results for input(s): LIPASE, AMYLASE in the last 168 hours. No results for input(s): AMMONIA in the last 168 hours. Coagulation profile Recent Labs  Lab 12/19/17 2253  INR 1.02    CBC: Recent Labs  Lab 12/19/17 2130  WBC 4.9  NEUTROABS 2.8  HGB 13.6  HCT 41.3  MCV 80.5  PLT 258   Cardiac Enzymes: No results for input(s): CKTOTAL, CKMB, CKMBINDEX, TROPONINI in the last 168 hours. BNP: Invalid input(s): POCBNP CBG: Recent Labs  Lab 12/19/17 2034 12/20/17 0123 12/20/17 0729 12/20/17 1138  GLUCAP 183* 105* 128* 125*   D-Dimer No results for input(s): DDIMER in the last 72 hours. Hgb A1c Recent Labs    12/20/17 0859  HGBA1C 6.5*   Lipid Profile Recent Labs    12/20/17 0859  CHOL 160  HDL 50  LDLCALC 88  TRIG 112  CHOLHDL 3.2   Thyroid function studies No results for input(s): TSH, T4TOTAL, T3FREE, THYROIDAB in the last 72 hours.  Invalid input(s):  FREET3 Anemia work up No results for input(s): VITAMINB12, FOLATE, FERRITIN, TIBC, IRON, RETICCTPCT in the last 72 hours. Microbiology No results found for this or any previous visit (from the past 240 hour(s)).   Discharge Instructions:   Discharge Instructions    Ambulatory referral to Neurology   Complete by:  As directed    An appointment is requested in approximately: 6 weeks Follow up with stroke clinic Darrol Angel preferred, if not available, then consider Sylvie Farrier, [facility] or Lucia Gaskins whoever  is available) at [facility] in about 6-8 weeks. Thanks.   Diet - low sodium heart healthy   Complete by:  As directed    Diet Carb Modified   Complete by:  As directed    Discharge instructions   Complete by:  As directed    Discuss coumadin with Dr. Ladona [name] at next appointment Bring log of blood sugars to PCP   Increase activity slowly   Complete by:  As directed      Allergies as of 12/20/2017      Reactions   Lipitor [atorvastatin] Nausea And Vomiting      Medication List    STOP taking these medications   aspirin EC 81 MG tablet Replaced by:  aspirin 325 MG tablet   sildenafil 50 MG tablet Commonly known as:  VIAGRA     TAKE these medications   aspirin 325 MG tablet Take 1 tablet (325 mg total) by mouth daily. Start taking on:  12/21/2017 Replaces:  aspirin EC 81 MG tablet   carvedilol 12.5 MG tablet Commonly known as:  COREG TAKE 1 & 1/2 (ONE & ONE-HALF) TABLETS BY MOUTH TWICE DAILY   clopidogrel 75 MG tablet Commonly known as:  PLAVIX Take 1 tablet (75 mg total) by mouth daily.   enalapril 5 MG tablet Commonly known as:  VASOTEC TAKE ONE TABLET BY MOUTH TWICE DAILY   glimepiride 2 MG tablet Commonly known as:  AMARYL TAKE ONE TABLET BY MOUTH ONCE DAILY WITH BREAKFAST   metFORMIN 1000 MG tablet Commonly known as:  GLUCOPHAGE Take 1 tablet (1,000 mg total) by mouth 2 (two) times daily with a meal. --- Office visit needed for further refills   ONE TOUCH ULTRA TEST test strip Generic drug:  glucose blood 1 each by Other route 2 (two) times daily.   ONETOUCH DELICA LANCETS 33G Misc 1 each 2 (two) times daily.   pravastatin 80 MG tablet Commonly known as:  PRAVACHOL Take 1 tablet (80 mg total) by mouth daily at 6 PM. What changed:    medication strength  how much to take  when to take this  additional instructions   tamsulosin 0.4 MG Caps capsule Commonly known as:  FLOMAX Take 1 capsule (0.4 mg total) by mouth daily after supper.      Follow-up  Information    Nilda Riggs, NP. Schedule an appointment as soon as possible for a visit in 6 week(s).   Specialty:  Family Medicine Contact information: [address] [place] [address] [phone]        Pincus Sanes, MD Follow up in 1 week(s).   Specialty:  Internal Medicine Contact information: [address] [place] [address] [phone]        Marinus Maw, MD Follow up.   Specialty:  Cardiology Why:  keep appointment for next week-- need to discuss anticoagulation-- warfarin Contact information: [address] [place] [address] [phone]  Time coordinating discharge: 35 min  Signed:  Joseph Art   [facility] 12/20/2017, 3:40 PM

## 2017-12-20 NOTE — Progress Notes (Addendum)
PROGRESS NOTE    Karl [name]  WUJ:[id number] DOB: [birth year] DOA: 12/19/2017 PCP: Pincus Sanes, MD   Outpatient Specialists:     Brief Narrative:  Karl [name] is a 62 y.o. male with medical history significant for hypertension, type 2 diabetes mellitus, and chronic systolic CHF, now presenting to the emergency department for evaluation of acute left arm weakness, left facial droop, and slurred speech.  Patient had been in his usual state of health, was having an uneventful day, and then noted that he kept dropping a glass that he was trying to wash.  He reports holding the glass in his left hand, but it kept slipping out.  He realized he was having left arm weakness, raised these concerns with family, who noted that he had a left facial droop and was slurring his words.  He denies any headache, change in vision or hearing, chest pain, or palpitations.  No recent fall or trauma.  Denies any recent alcohol or illicit substance use.  Has never experienced similar symptoms.  Patient called EMS and reports that symptoms began to improve by the time of their arrival.     Assessment & Plan:   Principal Problem:   TIA (transient ischemic attack) Active Problems:   Essential hypertension   Chronic systolic heart failure (HCC)   DM (diabetes mellitus), type 2, uncontrolled, periph vascular complic (HCC)   Coronary artery disease   Mild renal insufficiency   Acute left-sided weakness  - Presents with acute-onset of LUE weakness, left facial weakness, and dysarthria  -MRI: Faint 10 mm focus of reduced diffusion in the right mid posterior corona radiata compatible with acute infarct. No hemorrhage or mass effect. -B/L Carotid U/S: 1% to 39% ICA stenosis. Vertebral artey flow is antegrade -Outpatient follow up with Cardiology, Dr Ladona Ridgel NEURO RECS: Recommend AC therapy with Coumadin for 3 months, then repeat ECHO to re- evaluate EF and continued need for [facility] therapy.--- not to be started  until discussed with Dr. Ladona Ridgel -ASA 325/plavix per neuro -Arrange 30 day event monitor at discharge -statin increased   Chronic systolic CHF  - EF 20-25% on remote echo -- similar on this echo - Appears well-compensated  - Coreg and enalapril held initially in acute-phase of suspected CVA    Type II DM  - A1c was 6.9% a year ago -- slightly improved this visit - Managed at home with glimepiride and metformin -resume home meds  Hypertension  - BP modestly elevated in ED  - Permit HTN in acute-phase of suspected ischemic CVA  - Resume Coreg and enalapril as appropriate       DVT prophylaxis:  wafarin started  Code Status: Full Code   Family Communication:   Disposition Plan:     Consultants:   neurology    Subjective: No current complaints  Objective: Vitals:   12/20/17 0705 12/20/17 0930 12/20/17 1100 12/20/17 1335  BP: 133/85 (!) 150/88 (!) 139/93 (!) 157/78  Pulse: 61 62 65 65  Resp: 20 16 20 20   Temp:  98.4 F (36.9 C)  98 F (36.7 C)  TempSrc:  Oral  Oral  SpO2: 100% 97% 98% 100%  Weight:      Height:        Intake/Output Summary (Last 24 hours) at 12/20/2017 1525 Last data filed at 12/20/2017 1335 Gross per 24 hour  Intake 240 ml  Output 300 ml  Net -60 ml   Filed Weights   12/19/17 2030 12/20/17 0253  Weight:  104.3 kg (230 lb) 101.5 kg (223 lb 12.8 oz)    Examination:  General exam: Appears calm and comfortable  Respiratory system: Clear to auscultation. Respiratory effort normal. Cardiovascular system: S1 & S2 heard, RRR. No JVD, murmurs, rubs, gallops or clicks. No pedal edema. Gastrointestinal system: Abdomen is nondistended, soft and nontender. No organomegaly or masses felt. Normal bowel sounds heard. Central nervous system: Alert and oriented. No focal neurological deficits. Extremities: Symmetric 5 x 5 power. Skin: No rashes, lesions or ulcers Psychiatry: Judgement and insight appear normal. Mood & affect appropriate.      Data Reviewed: I have personally reviewed following labs and imaging studies  CBC: Recent Labs  Lab 12/19/17 2130  WBC 4.9  NEUTROABS 2.8  HGB 13.6  HCT 41.3  MCV 80.5  PLT 258   Basic Metabolic Panel: Recent Labs  Lab 12/19/17 2130  NA 138  K 3.7  CL 104  CO2 23  GLUCOSE 143*  BUN 17  CREATININE 1.25*  CALCIUM 9.4   GFR: Estimated Creatinine Clearance: 77.5 mL/min (A) (by C-G formula based on SCr of 1.25 mg/dL (H)). Liver Function Tests: Recent Labs  Lab 12/19/17 2130  AST 17  ALT 11*  ALKPHOS 55  BILITOT 0.6  PROT 7.1  ALBUMIN 3.7   No results for input(s): LIPASE, AMYLASE in the last 168 hours. No results for input(s): AMMONIA in the last 168 hours. Coagulation Profile: Recent Labs  Lab 12/19/17 2253  INR 1.02   Cardiac Enzymes: No results for input(s): CKTOTAL, CKMB, CKMBINDEX, TROPONINI in the last 168 hours. BNP (last 3 results) No results for input(s): PROBNP in the last 8760 hours. HbA1C: Recent Labs    12/20/17 0859  HGBA1C 6.5*   CBG: Recent Labs  Lab 12/19/17 2034 12/20/17 0123 12/20/17 0729 12/20/17 1138  GLUCAP 183* 105* 128* 125*   Lipid Profile: Recent Labs    12/20/17 0859  CHOL 160  HDL 50  LDLCALC 88  TRIG 112  CHOLHDL 3.2   Thyroid Function Tests: No results for input(s): TSH, T4TOTAL, FREET4, T3FREE, THYROIDAB in the last 72 hours. Anemia Panel: No results for input(s): VITAMINB12, FOLATE, FERRITIN, TIBC, IRON, RETICCTPCT in the last 72 hours. Urine analysis:    Component Value Date/Time   COLORURINE YELLOW 12/19/2017 2239   APPEARANCEUR CLEAR 12/19/2017 2239   LABSPEC 1.025 12/19/2017 2239   PHURINE 6.0 12/19/2017 2239   GLUCOSEU 250 (A) 12/19/2017 2239   HGBUR NEGATIVE 12/19/2017 2239   BILIRUBINUR NEGATIVE 12/19/2017 2239   BILIRUBINUR moderate 07/09/2014 1428   KETONESUR 15 (A) 12/19/2017 2239   PROTEINUR NEGATIVE 12/19/2017 2239   UROBILINOGEN 0.2 08/07/2015 0854   NITRITE NEGATIVE  12/19/2017 2239   LEUKOCYTESUR NEGATIVE 12/19/2017 2239   Sepsis Labs: @LABRCNTIP (procalcitonin:4,lacticidven:4)  )No results found for this or any previous visit (from the past 240 hour(s)).    Anti-infectives (From admission, onward)   None       Radiology Studies: Ct Head Wo Contrast  Result Date: 12/19/2017 CLINICAL DATA:  Neurological deficits. Left-sided weakness. Slurred speech. EXAM: CT HEAD WITHOUT CONTRAST TECHNIQUE: Contiguous axial images were obtained from the base of the skull through the vertex without intravenous contrast. COMPARISON:  None. FINDINGS: Brain: No evidence of acute infarction, hemorrhage, hydrocephalus, extra-axial collection or mass lesion/mass effect. Vascular: No hyperdense vessel or unexpected calcification. Skull: No osseous abnormality. Sinuses/Orbits: Visualized paranasal sinuses are clear. Visualized mastoid sinuses are clear. Visualized orbits demonstrate no focal abnormality. Other: None IMPRESSION: No acute intracranial pathology. Electronically Signed  By: Elige Ko   On: 12/19/2017 21:54   Mr Brain Wo Contrast  Result Date: 12/20/2017 CLINICAL DATA:  62 y/o M; acute left arm weakness, left facial droop, and slurred speech. EXAM: MRI HEAD WITHOUT CONTRAST MRA HEAD WITHOUT CONTRAST TECHNIQUE: Multiplanar, multiecho pulse sequences of the brain and surrounding structures were obtained without intravenous contrast. Angiographic images of the head were obtained using MRA technique without contrast. COMPARISON:  12/19/2017 CT head FINDINGS: MRI HEAD FINDINGS Brain: 10 mm focus of faint reduced diffusion within the right mid posterior corona radiata (series 4, image 31 and series 450, image 31). Signal abnormality on other sequences not present, but there is low ADC indicating acute infarction. No hemorrhage or mass effect. Left cerebellar hemisphere small chronic infarction. Fewnonspecific foci of T2 FLAIR hyperintense signal abnormality in subcortical  and periventricular white matter are compatible withmildchronic microvascular ischemic changes for age. Mildbrain parenchymal volume loss. No hydrocephalus, extra-axial collection, or effacement of basilar cisterns. Vascular: As below. Skull and upper cervical spine: Normal marrow signal. Sinuses/Orbits: Mild left maxillary sinus mucosal thickening. Otherwise negative. Other: None. MRA HEAD FINDINGS Internal carotid arteries:  Patent. Anterior cerebral arteries:  Patent. Middle cerebral arteries: Patent bilateral M1 and proximal M2 segments. Short segment of occlusion of a insular branch of the M2 inferior division (series 552, image 16 and series 5, image 60) with distal reconstitution. Anterior communicating artery: Patent. Posterior communicating arteries: Patent left. No right identified, likely hypoplastic or absent. Posterior cerebral arteries:  Patent. Basilar artery:  Patent. Vertebral arteries:  Patent. No additional evidence of high-grade stenosis, large vessel occlusion, or aneurysm unless noted above. IMPRESSION: 1. Faint 10 mm focus of reduced diffusion in the right mid posterior corona radiata compatible with acute infarct. No hemorrhage or mass effect. 2. Short segment of occlusion of a insular branch of right M2 inferior division with distal reconstitution. Otherwise unremarkable MRA of the head. 3. 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 4. Mild chronic microvascular ischemic changes and mild parenchymal volume loss of the brain. These results were called by telephone at the time of interpretation on 12/20/2017 at 1:07 am to Dr. Blinda Leatherwood, who verbally acknowledged these results. Electronically Signed   By: Mitzi Hansen M.D.   On: 12/20/2017 01:11   Mr Shirlee Latch AJ Contrast  Result Date: 12/20/2017 CLINICAL DATA:  62 y/o M; acute left arm weakness, left facial droop, and slurred speech. EXAM: MRI HEAD WITHOUT CONTRAST MRA HEAD WITHOUT CONTRAST TECHNIQUE: Multiplanar,  multiecho pulse sequences of the brain and surrounding structures were obtained without intravenous contrast. Angiographic images of the head were obtained using MRA technique without contrast. COMPARISON:  12/19/2017 CT head FINDINGS: MRI HEAD FINDINGS Brain: 10 mm focus of faint reduced diffusion within the right mid posterior corona radiata (series 4, image 31 and series 450, image 31). Signal abnormality on other sequences not present, but there is low ADC indicating acute infarction. No hemorrhage or mass effect. Left cerebellar hemisphere small chronic infarction. Fewnonspecific foci of T2 FLAIR hyperintense signal abnormality in subcortical and periventricular white matter are compatible withmildchronic microvascular ischemic changes for age. Mildbrain parenchymal volume loss. No hydrocephalus, extra-axial collection, or effacement of basilar cisterns. Vascular: As below. Skull and upper cervical spine: Normal marrow signal. Sinuses/Orbits: Mild left maxillary sinus mucosal thickening. Otherwise negative. Other: None. MRA HEAD FINDINGS Internal carotid arteries:  Patent. Anterior cerebral arteries:  Patent. Middle cerebral arteries: Patent bilateral M1 and proximal M2 segments. Short segment of occlusion of a insular branch  of the M2 inferior division (series 552, image 16 and series 5, image 60) with distal reconstitution. Anterior communicating artery: Patent. Posterior communicating arteries: Patent left. No right identified, likely hypoplastic or absent. Posterior cerebral arteries:  Patent. Basilar artery:  Patent. Vertebral arteries:  Patent. No additional evidence of high-grade stenosis, large vessel occlusion, or aneurysm unless noted above. IMPRESSION: 1. Faint 10 mm focus of reduced diffusion in the right mid posterior corona radiata compatible with acute infarct. No hemorrhage or mass effect. 2. Short segment of occlusion of a insular branch of right M2 inferior division with distal reconstitution.  Otherwise unremarkable MRA of the head. 3. 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 4. Mild chronic microvascular ischemic changes and mild parenchymal volume loss of the brain. These results were called by telephone at the time of interpretation on 12/20/2017 at 1:07 am to Dr. Blinda Leatherwood, who verbally acknowledged these results. Electronically Signed   By: Mitzi Hansen M.D.   On: 12/20/2017 01:11        Scheduled Meds: . aspirin  300 mg Rectal Daily   Or  . aspirin  325 mg Oral Daily  . enoxaparin (LOVENOX) injection  40 mg Subcutaneous Q24H  . insulin aspart  0-9 Units Subcutaneous Q4H  . pravastatin  80 mg Oral q1800  . tamsulosin  0.4 mg Oral QPC supper   Continuous Infusions:   LOS: 0 days    Time spent: 35 min    Joseph Art, DO [facility] Pager [phone]  If 7PM-7AM, please contact night-coverage [email or web address] Password [email or web address] 12/20/2017, 3:25 PM

## 2017-12-20 NOTE — Progress Notes (Signed)
NEUROHOSPITALISTS STROKE TEAM - DAILY PROGRESS NOTE   ADMISSION HISTORY: Karl [name] is an 62 y.o.  right-handed AA male past medical history of hypertension, diabetes mellitus, coronary artery disease who presents to the hospital after left right hand weakness and left facial droop.   The patient states around some 7.20 pm he was in the kitchen and noted he kept dropping his has a glass into the sink. He then walks out his living room and his daughter notices he has a left facial droop. EMS was called due to concern of stroke. However symptoms have resolved by the time EMS arrived. Patient feels like his back to his baseline. CT head was negative for any acute findings. He did not receive TPA as symptoms have resolved  Date last known well: 1.16.18 Time last known well: 7.20 pm tPA Given: no, symptoms resolved NIHSS:  0 Baseline MRS 0  SUBJECTIVE (INTERVAL HISTORY)  Family is at the bedside. Patient is found laying in bed in NAD. Overall he feels his condition is completely resolved. Voices no new complaints. No new events reported overnight.  Patient explains that he experienced approximately 15 minutes of Left hand weakness, Left facial droop and dysarthria last PM. Said by the time the EMS came to the house his symptoms had mostly resolved.   OBJECTIVE Lab Results: CBC:  Recent Labs  Lab 12/19/17 2130  WBC 4.9  HGB 13.6  HCT 41.3  MCV 80.5  PLT 258   BMP: Recent Labs  Lab 12/19/17 2130  NA 138  K 3.7  CL 104  CO2 23  GLUCOSE 143*  BUN 17  CREATININE 1.25*  CALCIUM 9.4   Liver Function Tests:  Recent Labs  Lab 12/19/17 2130  AST 17  ALT 11*  ALKPHOS 55  BILITOT 0.6  PROT 7.1  ALBUMIN 3.7   Coagulation Studies:  Recent Labs    12/19/17 2253  APTT 27  INR 1.02   Urinalysis:  Recent Labs  Lab 12/19/17 2239  COLORURINE YELLOW  APPEARANCEUR CLEAR  LABSPEC 1.025  PHURINE 6.0  GLUCOSEU 250*    HGBUR NEGATIVE  BILIRUBINUR NEGATIVE  KETONESUR 15*  PROTEINUR NEGATIVE  NITRITE NEGATIVE  LEUKOCYTESUR NEGATIVE   PHYSICAL EXAM Temp:  [98 F (36.7 C)-98.5 F (36.9 C)] 98 F (36.7 C) (01/17 1335) Pulse Rate:  [52-87] 65 (01/17 1335) Resp:  [14-24] 20 (01/17 1335) BP: (133-185)/(68-115) 157/78 (01/17 1335) SpO2:  [95 %-100 %] 100 % (01/17 1335) Weight:  [101.5 kg (223 lb 12.8 oz)-104.3 kg (230 lb)] 101.5 kg (223 lb 12.8 oz) (01/17 0253) General - Well nourished, well developed, in no apparent distress HEENT-  Normocephalic,   Cardiovascular - Regular rate and rhythm  Respiratory - Lungs clear bilaterally. No wheezing. Abdomen - soft and non-tender, BS normal Extremities- no edema or cyanosis Neurological Examination Mental Status: Alert, oriented, thought content appropriate.  Speech fluent without evidence of aphasia. Able to follow 3 step commands without difficulty. Cranial Nerves: II: Visual fields grossly normal,  III,IV, VI: ptosis not present, extra-ocular motions intact bilaterally, pupils equal, round, reactive to light and accommodation V,VII: smile symmetric, facial light touch sensation normal bilaterally VIII: hearing normal bilaterally IX,X:  uvula rises symmetrically XI: bilateral shoulder shrug XII: midline tongue extension Motor: Right : Upper extremity   5/5    Left:     Upper extremity   5/5  Lower extremity   5/5     Lower extremity   5/5 Tone and bulk:normal tone throughout; no atrophy noted Sensory: Pinprick and light touch intact throughout, bilaterally Deep Tendon Reflexes: 2+ and symmetric throughout Plantars: Right: downgoing   Left: downgoing Cerebellar: normal finger-to-nose, normal rapid alternating movements and normal heel-to-shin test Gait: normal gait and station  IMAGING: I have personally reviewed the radiological images below and agree with the radiology interpretations. Ct Head Wo Contrast Result Date: 12/19/2017 IMPRESSION: No  acute intracranial pathology. Electronically Signed   By: Elige Ko   On: 12/19/2017 21:54   MRI/ MRA Head Wo Contrast Result Date: 12/20/2017 IMPRESSION: 1. Faint 10 mm focus of reduced diffusion in the right mid posterior corona radiata compatible with acute infarct. No hemorrhage or mass effect. 2. Short segment of occlusion of a insular branch of right M2 inferior division with distal reconstitution. Otherwise unremarkable MRA of the head. 3. 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 4. Mild chronic microvascular ischemic changes and mild parenchymal volume loss of the brain.  Echocardiogram:                                               Study Conclusions - Left ventricle: The cavity size was severely dilated. There was   moderate concentric hypertrophy. Systolic function was severely   reduced. The estimated ejection fraction was in the range of 20%   to 25%. Diffuse hypokinesis. Doppler parameters are consistent   with abnormal left ventricular relaxation (grade 1 diastolic   dysfunction). There was no evidence of elevated ventricular   filling pressure by Doppler parameters. - Aortic valve: Valve area (VTI): 3.71 cm^2. Valve area (Vmax):   3.87 cm^2. Valve area (Vmean): 3.94 cm^2. - Mitral valve: There was mild regurgitation. - Left atrium: The atrium was moderately dilated. - Right ventricle: Systolic function was mildly reduced. - Tricuspid valve: There was trivial regurgitation. - Pulmonary arteries: Systolic pressure was within the normal   range. - Inferior vena cava: The vessel was normal in size. Impressions: There is no significant change from the prior study on   09/16/2014.  B/L Carotid U/S:                                                 1% to 39% ICA stenosis. Vertebral artey flow is antegrade.    IMPRESSION: [name] is a 62 y.o. male with PMH of hypertension, diabetes mellitus and coronary artery disease who presented to the ED with acute  onset of left hand weakness, dysarthria and left facial droop. All symptoms resolved upon admission. MRI reveals:   Faint 10 mm focus of reduced diffusion in the right mid posterior corona radiata compatible with acute infarct  Short segment of occlusion of a insular branch of right M2 inferior division with distal reconstitution.  Suspected Etiology: athero vs cardioembolic source  Resultant Symptoms:  left right hand weakness and left facial droop Stroke Risk Factors: diabetes mellitus and hypertension Other Stroke Risk Factors: Advanced age,  Hx stroke, CAD  Outstanding Stroke Work-up Studies:    Work up completed at this time  12/20/2017 ASSESSMENT:   Neuro exam non-focal. States all of his symptoms have resolved. Reviewed all imaging, labs and POC. Discussed AC therapy and patient is in agreement. "Anything that will help prevent me from having more strokes". 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   INTRACRANIAL Atherosclerosis &Stenosis: Coumadin therapy to be started  Known Low EF Consider Outpatient Cardiology consultation with Dr Ladona Ridgel Recommend [facility] therapy with Coumadin for 3 months, then repeat ECHO to re- evaluate EF and continued need for [facility] therapy.  R/O AFIB Arrange 30 day event monitor at discharge  HYPERTENSION: Stable Permissive hypertension (OK if <220/120) for 24-48 hours post stroke and then gradually normalized within 5-7 days. Long term BP goal normotensive. May slowly restart home  B/P medications after 48 hours Home Meds: Coreg, Vasotec  HYPERLIPIDEMIA:    Component Value Date/Time   CHOL 160 12/20/2017 0859   TRIG 112 12/20/2017 0859   HDL 50 12/20/2017 0859   CHOLHDL 3.2 12/20/2017 0859   VLDL 22 12/20/2017 0859   LDLCALC 88 12/20/2017 0859  Home Meds:  Pravachol 40 mg LDL  goal < 70 Continued on Pravachol 80 mg daily, allergy to Lipitor Continue statin at discharge  DIABETES: Lab Results  Component Value Date   HGBA1C 6.5 (H) 12/20/2017   Recent Labs  Lab 12/19/17 2034 12/20/17 0123 12/20/17 0729 12/20/17 1138  GLUCAP 183* 105* 128* 125*  HgbA1c goal < 7.0 Currently on: Novolog Continue CBG monitoring and SSI to maintain glucose 140-180 mg/dl DM education   Other Active Problems: Principal Problem:   TIA (transient ischemic attack) Active Problems:   Essential hypertension   Chronic systolic heart failure (HCC)   DM (diabetes mellitus), type 2, uncontrolled, periph vascular complic (HCC)   Coronary artery disease   Mild renal insufficiency    Hospital day # 0 VTE prophylaxis: Lovenox  Diet : Diet Carb Modified Fluid consistency: Thin; Room service appropriate? Yes   FAMILY UPDATES: family at bedside  TEAM UPDATES: Vann, Jessica U, DO   Prior Home Stroke Medications:  aspirin 81 mg daily  Discharge Stroke Meds:  Please discharge patient on aspirin 325 mg daily   Disposition: 01-Home or Self Care Therapy Recs:               PENDING Home Equipment:         PENDING Follow Up:  Follow-up Information    Nilda Riggs, NP. Schedule an appointment as soon as possible for a visit in 6 week(s).   Specialty:  Family Medicine Contact information: [address] [place] [address] [phone]          Pincus Sanes, MD -PCP Follow up in 1-2 weeks      Assessment & plan discussed with with attending physician and they are in agreement.    Brita Romp Stroke Neurology Team 12/20/2017 1:58  PM  ATTENDING NOTE: I reviewed above note and agree with the assessment and plan. I have made any additions  or clarifications directly to the above note. Pt was seen and examined.   62 yo M with hx of DM, HTN, CAD on ASA and pravastatin admitted for episode of left facial droop and left hand weakness. Symptoms resolved. MRI showed right CR mildly restriction on DWI, concerning for right CR infarct.  MRI showed right M2 high-grade stenosis.  Carotid Doppler negative.  2D echo showed EF 20-25%.  Patient had previous 2D echo in 2015 also showed EF 20-25%.  LDL 88 and A1c 6.5.  UDS negative.  Although patient has stroke risk factor including diabetes, hypertension, CAD, however his cardiomyopathy with low EF could be potentially etiology for his recent stroke and right M2 stenosis.  Recommend anticoagulation with Coumadin or DOACs for stroke prevention. Repeat TTE in 3 months, if EF > 30%, anticoagulation can be discontinued.  He has been followed with Dr. Ladona Ridgel in cardiology, he has incoming appointment on 12/25/17.  He would like to discuss with Dr. Ladona Ridgel regarding anti-coagulation with either Coumadin or DOACs.  In the meantime, due to right M2 high-grade stenosis, will put him on dual antiplatelet with aspirin 325 and Plavix.  Also increase pravastatin for LDL goal < 70 and stroke prevention.  Patient and wife agreeable for the plan.  I also discussed with Dr. Benjamine Mola. I will also forward note to Dr. Ladona Ridgel to let him know.  Neurology will sign off. Please call with questions. Pt will follow up with Dr. Roda Shutters at [facility] in about 6 weeks. Thanks for the consult.  Marvel Plan, MD PhD Stroke Neurology 12/20/2017 4:10 PM   To contact Stroke Continuity provider, please refer to [email or web address]. After hours, contact General Neurology

## 2017-12-20 NOTE — Progress Notes (Signed)
Received report from ER nurse for admission to 3w31.  Notified patient had not completed stroke swallow screen at time of report as pt awaiting MRI/MRA and currently in MRI for scan.  Reported by ER RN that patient had been maintained strict NPO while awaiting stroke swallow screen including no PO medication in ER.  Swallow screen to be done on admission by this RN.

## 2017-12-20 NOTE — Evaluation (Signed)
Physical Therapy Evaluation Patient Details Name: Karl [name] MRN: [id number] DOB: Apr 26, 1956 Today's Date: 12/20/2017   History of Present Illness  Karl [name] is a 62 y.o. male with medical history significant for hypertension, type 2 diabetes mellitus, and chronic systolic CHF, now presenting to the emergency department for evaluation of acute left arm weakness, left facial droop, and slurred speech.   Clinical Impression  Patient presents at functional baseline independent with ambulation, stairs and performing high on balance measures.  No current skilled PT needs identified.  Will sign off.  Did review stroke education for symptoms and importance of seeking care immediately.    Follow Up Recommendations No PT follow up    Equipment Recommendations  None recommended by PT    Recommendations for Other Services       Precautions / Restrictions Precautions Precautions: None      Mobility  Bed Mobility Overal bed mobility: Independent                Transfers Overall transfer level: Independent                  Ambulation/Gait Ambulation/Gait assistance: Independent Ambulation Distance (Feet): 200 Feet Assistive device: None Gait Pattern/deviations: WFL(Within Functional Limits)        Stairs Stairs: Yes Stairs assistance: Independent Stair Management: No rails;Forwards;Alternating pattern Number of Stairs: 2    Wheelchair Mobility    Modified Rankin (Stroke Patients Only) Modified Rankin (Stroke Patients Only) Pre-Morbid Rankin Score: No symptoms Modified Rankin: No significant disability     Balance Overall balance assessment: Independent                               Standardized Balance Assessment Standardized Balance Assessment : Berg Balance Test;Dynamic Gait Index Berg Balance Test Sit to Stand: Able to stand without using hands and stabilize independently Standing Unsupported: Able to stand safely 2  minutes Sitting with Back Unsupported but Feet Supported on Floor or Stool: Able to sit safely and securely 2 minutes Stand to Sit: Sits safely with minimal use of hands Transfers: Able to transfer safely, minor use of hands Standing Unsupported with Eyes Closed: Able to stand 10 seconds safely Standing Ubsupported with Feet Together: Able to place feet together independently and stand 1 minute safely From Standing, Reach Forward with Outstretched Arm: Can reach confidently >25 cm (10") From Standing Position, Pick up Object from Floor: Able to pick up shoe safely and easily From Standing Position, Turn to Look Behind Over each Shoulder: Looks behind from both sides and weight shifts well Turn 360 Degrees: Able to turn 360 degrees safely in 4 seconds or less Standing Unsupported, Alternately Place Feet on Step/Stool: Able to stand independently and safely and complete 8 steps in 20 seconds Standing Unsupported, One Foot in Front: Able to place foot tandem independently and hold 30 seconds Standing on One Leg: Tries to lift leg/unable to hold 3 seconds but remains standing independently Total Score: 53 Dynamic Gait Index Level Surface: Normal Change in Gait Speed: Normal Gait with Horizontal Head Turns: Normal Gait with Vertical Head Turns: Normal Gait and Pivot Turn: Normal Step Over Obstacle: Normal Step Around Obstacles: Normal Steps: Normal Total Score: 24       Pertinent Vitals/Pain Pain Assessment: No/denies pain    Home Living Family/patient expects to be discharged to:: Private residence Living Arrangements: Spouse/significant other;Children Available Help at Discharge: Family Type of Home: House  Home Access: Stairs to enter   Entrance Stairs-Number of Steps: 1 Home Layout: One level Home Equipment: None      Prior Function Level of Independence: Independent         Comments: 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, 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      Higher education careers adviser        Extremity/Trunk Assessment   Upper Extremity Assessment Upper Extremity Assessment: Overall WFL for tasks assessed    Lower Extremity Assessment Lower Extremity Assessment: Overall WFL for tasks assessed       Communication   Communication: No difficulties  Cognition Arousal/Alertness: Awake/alert Behavior During Therapy: WFL for tasks assessed/performed Overall Cognitive Status: Within Functional Limits for tasks assessed                                        General Comments      Exercises     Assessment/Plan    PT Assessment Patent does not need any further PT services  PT Problem List         PT Treatment Interventions      PT Goals (Current goals can be found in the Care Plan section)  Acute Rehab PT Goals PT Goal Formulation: All assessment and education complete, DC therapy    Frequency     Barriers to discharge        Co-evaluation               AM-PAC PT "6 Clicks" Daily Activity  Outcome Measure Difficulty turning over in bed (including adjusting bedclothes, sheets and blankets)?: None Difficulty moving from lying on back to sitting on the side of the bed? : None Difficulty sitting down on and standing up from a chair with arms (e.g., wheelchair, bedside commode, etc,.)?: None Help needed moving to and from a bed to chair (including a wheelchair)?: None Help needed walking in hospital room?: None Help needed climbing 3-5 steps with a railing? : None 6 Click Score: 24    End of Session Equipment Utilized During Treatment: Gait belt Activity Tolerance: Patient tolerated treatment well Patient left: in bed;with family/visitor present   PT Visit Diagnosis: Other abnormalities of gait and mobility (R26.89)    Time: 4098-1191 PT Time Calculation (min) (ACUTE ONLY): 19 min   Charges:   PT Evaluation $PT Eval Low Complexity: 1 Low     PT G CodesSheran Lawless,  [place] [phone] 12/20/2017   Elray Mcgregor 12/20/2017, 3:10 PM

## 2017-12-20 NOTE — Consult Note (Signed)
Requesting Physician: Dr. Jacqulyn Bath    Chief Complaint: Left hand weakness, facial droop  History obtained from: Patient and Chart    HPI:                                                                                                                                       Karl [name] is an 62 y.o.  right-handed AA male past medical history of hypertension, diabetes mellitus, coronary artery disease who presents to the hospital after left right hand weakness and left facial droop.  The patient states around some 7.20 pm he was in the kitchen and noted he kept dropping his has a glass into the sink. He then walks out his living room and his daughter notices he has a left facial droop. EMS was called due to concern of stroke. However symptoms have resolved by the time EMS arrived. Patient feels like his back to his baseline.  CT head was negative for any acute findings. He did not receive TPA as symptoms have resolved  Date last known well: 1.16.18 Time last known well: 7.20 pm tPA Given: no, symptoms resolved NIHSS:  0 Baseline MRS 0    Past Medical History:  Diagnosis Date  . Coronary artery disease   . Diabetes mellitus (HCC) 07/2014 dx  . Hypertension     Past Surgical History:  Procedure Laterality Date  . CORONARY STENT PLACEMENT  1998    Family History  Problem Relation Age of Onset  . Diabetes Mother   . Diabetes Father   . Stroke Father 23  . Diabetes Sister   . Clotting disorder Sister 31  . Diabetes Other        nephew  . Diabetes Brother   . Diabetes Maternal Grandmother    Social History:  reports that he quit smoking about 17 years ago. His smoking use included cigarettes. He has a 10.00 pack-year smoking history. he has never used smokeless tobacco. He reports that he drinks alcohol. His drug history is not on file.  Allergies:  Allergies  Allergen Reactions  . Lipitor [Atorvastatin] Nausea And Vomiting    Medications:                                                                                                                         I reviewed home medications. Takes ASA daily.    ROS:  14 systems reviewed and negative except above .   Examination:                                                                                                      General: Appears well-developed and well-nourished.  Psych: Affect appropriate to situation Eyes: No scleral injection HENT: No OP obstrucion Head: Normocephalic.  Cardiovascular: Normal rate and regular rhythm.  Respiratory: Effort normal and breath sounds normal to anterior ascultation GI: Soft.  No distension. There is no tenderness.  Skin: WDI   Neurological Examination Mental Status: Alert, oriented, thought content appropriate.  Speech fluent without evidence of aphasia. Able to follow 3 step commands without difficulty. Cranial Nerves: II: Visual fields grossly normal,  III,IV, VI: ptosis not present, extra-ocular motions intact bilaterally, pupils equal, round, reactive to light and accommodation V,VII: smile symmetric, facial light touch sensation normal bilaterally VIII: hearing normal bilaterally IX,X: uvula rises symmetrically XI: bilateral shoulder shrug XII: midline tongue extension Motor: Right : Upper extremity   5/5    Left:     Upper extremity   5/5  Lower extremity   5/5     Lower extremity   5/5 Tone and bulk:normal tone throughout; no atrophy noted Sensory: Pinprick and light touch intact throughout, bilaterally Deep Tendon Reflexes: 2+ and symmetric throughout Plantars: Right: downgoing   Left: downgoing Cerebellar: normal finger-to-nose, normal rapid alternating movements and normal heel-to-shin test Gait: normal gait and station     Lab Results: Basic Metabolic Panel: Recent Labs  Lab 12/19/17 2130  NA 138  K 3.7  CL  104  CO2 23  GLUCOSE 143*  BUN 17  CREATININE 1.25*  CALCIUM 9.4    CBC: Recent Labs  Lab 12/19/17 2130  WBC 4.9  NEUTROABS 2.8  HGB 13.6  HCT 41.3  MCV 80.5  PLT 258    Coagulation Studies: Recent Labs    12/19/17 05-04-52  LABPROT 13.3  INR 1.02    Imaging: Ct Head Wo Contrast  Result Date: 12/19/2017 CLINICAL DATA:  Neurological deficits. Left-sided weakness. Slurred speech. EXAM: CT HEAD WITHOUT CONTRAST TECHNIQUE: Contiguous axial images were obtained from the base of the skull through the vertex without intravenous contrast. COMPARISON:  None. FINDINGS: Brain: No evidence of acute infarction, hemorrhage, hydrocephalus, extra-axial collection or mass lesion/mass effect. Vascular: No hyperdense vessel or unexpected calcification. Skull: No osseous abnormality. Sinuses/Orbits: Visualized paranasal sinuses are clear. Visualized mastoid sinuses are clear. Visualized orbits demonstrate no focal abnormality. Other: None IMPRESSION: No acute intracranial pathology. Electronically Signed   By: Elige Ko   On: 12/19/2017 21:54   Mr Brain Wo Contrast  Result Date: 12/20/2017 CLINICAL DATA:  62 y/o M; acute left arm weakness, left facial droop, and slurred speech. EXAM: MRI HEAD WITHOUT CONTRAST MRA HEAD WITHOUT CONTRAST TECHNIQUE: Multiplanar, multiecho pulse sequences of the brain and surrounding structures were obtained without intravenous contrast. Angiographic images of the head were obtained using MRA technique without contrast. COMPARISON:  12/19/2017 CT head FINDINGS: MRI HEAD FINDINGS Brain: 10 mm focus of faint reduced diffusion within the right  mid posterior corona radiata (series 4, image 31 and series 450, image 31). Signal abnormality on other sequences not present, but there is low ADC indicating acute infarction. No hemorrhage or mass effect. Left cerebellar hemisphere small chronic infarction. Fewnonspecific foci of T2 FLAIR hyperintense signal abnormality in subcortical  and periventricular white matter are compatible withmildchronic microvascular ischemic changes for age. Mildbrain parenchymal volume loss. No hydrocephalus, extra-axial collection, or effacement of basilar cisterns. Vascular: As below. Skull and upper cervical spine: Normal marrow signal. Sinuses/Orbits: Mild left maxillary sinus mucosal thickening. Otherwise negative. Other: None. MRA HEAD FINDINGS Internal carotid arteries:  Patent. Anterior cerebral arteries:  Patent. Middle cerebral arteries: Patent bilateral M1 and proximal M2 segments. Short segment of occlusion of a insular branch of the M2 inferior division (series 552, image 16 and series 5, image 60) with distal reconstitution. Anterior communicating artery: Patent. Posterior communicating arteries: Patent left. No right identified, likely hypoplastic or absent. Posterior cerebral arteries:  Patent. Basilar artery:  Patent. Vertebral arteries:  Patent. No additional evidence of high-grade stenosis, large vessel occlusion, or aneurysm unless noted above. IMPRESSION: 1. Faint 10 mm focus of reduced diffusion in the right mid posterior corona radiata compatible with acute infarct. No hemorrhage or mass effect. 2. Short segment of occlusion of a insular branch of right M2 inferior division with distal reconstitution. Otherwise unremarkable MRA of the head. 3. 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 4. Mild chronic microvascular ischemic changes and mild parenchymal volume loss of the brain. These results were called by telephone at the time of interpretation on 12/20/2017 at 1:07 am to Dr. Blinda Leatherwood, who verbally acknowledged these results. Electronically Signed   By: Mitzi Hansen M.D.   On: 12/20/2017 01:11   Mr Shirlee Latch WU Contrast  Result Date: 12/20/2017 CLINICAL DATA:  62 y/o M; acute left arm weakness, left facial droop, and slurred speech. EXAM: MRI HEAD WITHOUT CONTRAST MRA HEAD WITHOUT CONTRAST TECHNIQUE: Multiplanar,  multiecho pulse sequences of the brain and surrounding structures were obtained without intravenous contrast. Angiographic images of the head were obtained using MRA technique without contrast. COMPARISON:  12/19/2017 CT head FINDINGS: MRI HEAD FINDINGS Brain: 10 mm focus of faint reduced diffusion within the right mid posterior corona radiata (series 4, image 31 and series 450, image 31). Signal abnormality on other sequences not present, but there is low ADC indicating acute infarction. No hemorrhage or mass effect. Left cerebellar hemisphere small chronic infarction. Fewnonspecific foci of T2 FLAIR hyperintense signal abnormality in subcortical and periventricular white matter are compatible withmildchronic microvascular ischemic changes for age. Mildbrain parenchymal volume loss. No hydrocephalus, extra-axial collection, or effacement of basilar cisterns. Vascular: As below. Skull and upper cervical spine: Normal marrow signal. Sinuses/Orbits: Mild left maxillary sinus mucosal thickening. Otherwise negative. Other: None. MRA HEAD FINDINGS Internal carotid arteries:  Patent. Anterior cerebral arteries:  Patent. Middle cerebral arteries: Patent bilateral M1 and proximal M2 segments. Short segment of occlusion of a insular branch of the M2 inferior division (series 552, image 16 and series 5, image 60) with distal reconstitution. Anterior communicating artery: Patent. Posterior communicating arteries: Patent left. No right identified, likely hypoplastic or absent. Posterior cerebral arteries:  Patent. Basilar artery:  Patent. Vertebral arteries:  Patent. No additional evidence of high-grade stenosis, large vessel occlusion, or aneurysm unless noted above. IMPRESSION: 1. Faint 10 mm focus of reduced diffusion in the right mid posterior corona radiata compatible with acute infarct. No hemorrhage or mass effect. 2. Short segment of occlusion of a  insular branch of right M2 inferior division with distal reconstitution.  Otherwise unremarkable MRA of the head. 3. 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 4. Mild chronic microvascular ischemic changes and mild parenchymal volume loss of the brain. These results were called by telephone at the time of interpretation on 12/20/2017 at 1:07 am to Dr. Blinda Leatherwood, who verbally acknowledged these results. Electronically Signed   By: Mitzi Hansen M.D.   On: 12/20/2017 01:11     ASSESSMENT AND PLAN  62 y.o.  right-handed AA male past medical history of hypertension, diabetes mellitus, coronary artery disease.   Transient ischemic attack   Recommend # MRI of the brain without contrast #MRA Head and neck  #Transthoracic Echo  # Carotid doppler  # Start patient on ASA 325mg  daily #Start or continue Atorvastatin 80 mg/other high intensity statin # BP goal: permissive HTN upto 210 systolic, PRNs above 21 # HBAIC and Lipid profile # Telemetry monitoring # Frequent neuro checks # NPO until passes stroke swallow screen  Please page stroke NP  Or  PA  Or MD from 8am -4 pm  as this patient from this time will be  followed by the stroke.   You can look them up on [email or web address]  Password [facility]    Anarie Kalish [facility] Pager Number [phone]

## 2017-12-20 NOTE — Progress Notes (Signed)
Patient ready for discharge to home; discharge instructions given and reviewed; Rx's given. Patient dressed and discharged out via wheelchair accompanied by his wife and family.

## 2017-12-20 NOTE — Progress Notes (Signed)
SLP Cancellation Note  Patient Details Name: Karl [name] MRN: [id number] DOB: 1956/09/12   Cancelled treatment:       Reason Eval/Treat Not Completed: SLP screened, no needs identified, will sign off  Rondel Baton, [place], [employer] Pathologist [phone]  Arlana Lindau 12/20/2017, 4:43 PM

## 2017-12-20 NOTE — Progress Notes (Signed)
Bilateral carotid duplex completed. 1% to 39% ICA stenosis. Vertebral artey flow is antegrade. [employer], RVS 12/20/2017 10:33 AM

## 2017-12-20 NOTE — Plan of Care (Signed)
  Education: Knowledge of General Education information will improve 12/20/2017 0551 - Progressing by Estrella Deeds, RN   Health Behavior/Discharge Planning: Ability to manage health-related needs will improve 12/20/2017 0551 - Progressing by Estrella Deeds, RN   Clinical Measurements: Ability to maintain clinical measurements within normal limits will improve 12/20/2017 0551 - Progressing by Estrella Deeds, RN Will remain free from infection 12/20/2017 0551 - Progressing by Estrella Deeds, RN Diagnostic test results will improve 12/20/2017 0551 - Progressing by Estrella Deeds, RN Respiratory complications will improve 12/20/2017 0551 - Progressing by Estrella Deeds, RN Cardiovascular complication will be avoided 12/20/2017 0551 - Progressing by Estrella Deeds, RN   Activity: Risk for activity intolerance will decrease 12/20/2017 0551 - Progressing by Estrella Deeds, RN   Nutrition: Adequate nutrition will be maintained 12/20/2017 0551 - Progressing by Estrella Deeds, RN   Coping: Level of anxiety will decrease 12/20/2017 0551 - Progressing by Estrella Deeds, RN   Elimination: Will not experience complications related to bowel motility 12/20/2017 0551 - Progressing by Estrella Deeds, RN Will not experience complications related to urinary retention 12/20/2017 0551 - Progressing by Estrella Deeds, RN   Pain Managment: General experience of comfort will improve 12/20/2017 0551 - Progressing by Estrella Deeds, RN   Safety: Ability to remain free from injury will improve 12/20/2017 0551 - Progressing by Estrella Deeds, RN   Skin Integrity: Risk for impaired skin integrity will decrease 12/20/2017 0551 - Progressing by Estrella Deeds, RN   Education: Knowledge of disease or condition will improve 12/20/2017 0551 - Progressing by Estrella Deeds, RN Knowledge of secondary prevention will improve 12/20/2017  0551 - Progressing by Estrella Deeds, RN Knowledge of patient specific risk factors addressed and post discharge goals established will improve 12/20/2017 0551 - Progressing by Estrella Deeds, RN   Coping: Will verbalize positive feelings about self 12/20/2017 0551 - Progressing by Estrella Deeds, RN   Health Behavior/Discharge Planning: Ability to manage health-related needs will improve 12/20/2017 0551 - Progressing by Estrella Deeds, RN   Self-Care: Ability to participate in self-care as condition permits will improve 12/20/2017 0551 - Progressing by Estrella Deeds, RN Verbalization of feelings and concerns over difficulty with self-care will improve 12/20/2017 0551 - Progressing by Estrella Deeds, RN Ability to communicate needs accurately will improve 12/20/2017 0551 - Progressing by Estrella Deeds, RN   Nutrition: Risk of aspiration will decrease 12/20/2017 0551 - Progressing by Estrella Deeds, RN   Ischemic Stroke/TIA Tissue Perfusion: Complications of ischemic stroke/TIA will be minimized 12/20/2017 0551 - Progressing by Estrella Deeds, RN   Ischemic Stroke/TIA Tissue Perfusion: Complications of ischemic stroke/TIA will be minimized 12/20/2017 0551 - Progressing by Estrella Deeds, RN

## 2017-12-20 NOTE — Care Management Note (Signed)
Case Management Note  Patient Details  Name: Karl [name] MRN: [id number] Date of Birth: 1956/08/10  Subjective/Objective:    Pt admitted with CVA. He is from home with wife.                 Action/Plan: No f/u per PT and no DME needs. Pt has PCP, insurance and transportation home. No further needs per CM.  Expected Discharge Date:  12/20/17               Expected Discharge Plan:  Home/Self Care  In-House Referral:     Discharge planning Services     Post Acute Care Choice:    Choice offered to:     DME Arranged:    DME Agency:     HH Arranged:    HH Agency:     Status of Service:  Completed, signed off  If discussed at [employer] of Stay Meetings, dates discussed:    Additional Comments:  Kermit Balo, RN 12/20/2017, 5:04 PM

## 2017-12-24 NOTE — Progress Notes (Signed)
Cardiology Office Note:    Date:  12/25/2017   ID:  Karl [name], DOB [birth year], MRN [id number]  PCP:  Pincus Sanes, MD  Cardiologist:  Lewayne Bunting, MD   Referring MD: Pincus Sanes, MD  hosp Chief Complaint  Patient presents with  . Hospitalization Follow-up    stroke, Cardiomyopathy    History of Present Illness:    Karl [name] is a 62 y.o. male with a past medical history significant for HTN, DM type 2, chronic systolic CHF, remote substance abuse and CAD with remote stent placement in 1998. At last office visit in 06/2017 heart failure was well compensated.  Karl. Nicolosi recently presented to the hospital, 12/19/17, with left upper extremity weakness, facial droop and mild speech difficulty.  The patient's symptoms resolved by the time he got to the hospital.  CT of the head showed no acute abnormality but MRI showed Faint 10 mm focus of reduced diffusion in the right mid posterior corona radiata compatible with acute infarct and chronic small infarction in left cerebellar hemisphere.  An echocardiogram on 12/20/17 showed EF 20-25% with diffuse hypokinesis which is similar to prior study in 2015.  Carotid Dopplers were negative.    Neurology, Dr. Roda Shutters, felt that although patient has stroke risk factors including diabetes, hypertension, CAD, his cardiomyopathy with low EF could be the potential etiology for his stroke and right M2 stenosis. He recommended anticoagulation with Coumadin or DOAC for stroke prevention, then repeat echo in 3 months to re-evaluate EF.  If EF > 30%, anticoagulation could be discontinued.  The patient was discharged on aspirin 325 mg and Plavix 75 mg as well as an increase in his statin therapy with plans to discuss anticoagulation at this visit.  His carvedilol and ACE inhibitor were continued for his heart failure treatment.  Neurology also recommended a 30-day event monitor to rule out atrial fibrillation.   Karl [name] is here today alone for follow up. He is  feeling well. No recurrence of stroke symptoms. Taking his meds as prescribed. He denies palpitations or heart  fluttering. No shortness of breath, CP, orthopnea, PND or edema. Occasionally feels tired after chores like taking the trash out.   Past Medical History:  Diagnosis Date  . Coronary artery disease   . Diabetes mellitus (HCC) 07/2014 dx  . Hypertension     Past Surgical History:  Procedure Laterality Date  . CORONARY STENT PLACEMENT  1998    Current Medications: Current Meds  Medication Sig  . aspirin 325 MG tablet Take 1 tablet (325 mg total) by mouth daily.  . carvedilol (COREG) 12.5 MG tablet TAKE 1 & 1/2 (ONE & ONE-HALF) TABLETS BY MOUTH TWICE DAILY  . clopidogrel (PLAVIX) 75 MG tablet Take 1 tablet (75 mg total) by mouth daily.  . enalapril (VASOTEC) 5 MG tablet TAKE ONE TABLET BY MOUTH TWICE DAILY  . glimepiride (AMARYL) 2 MG tablet TAKE ONE TABLET BY MOUTH ONCE DAILY WITH BREAKFAST  . metFORMIN (GLUCOPHAGE) 1000 MG tablet Take 1 tablet (1,000 mg total) by mouth 2 (two) times daily with a meal. --- Office visit needed for further refills  . ONE TOUCH ULTRA TEST test strip 1 each by Other route 2 (two) times daily.   Letta Pate DELICA LANCETS 33G MISC 1 each 2 (two) times daily.   . pravastatin (PRAVACHOL) 80 MG tablet Take 1 tablet (80 mg total) by mouth daily at 6 PM.  . tamsulosin (FLOMAX) 0.4 MG CAPS capsule Take 1  capsule (0.4 mg total) by mouth daily after supper.     Allergies:   Lipitor [atorvastatin]   Social History   Socioeconomic History  . Marital status: Married    Spouse name: None  . Number of children: None  . Years of education: None  . Highest education level: None  Social Needs  . Financial resource strain: None  . Food insecurity - worry: None  . Food insecurity - inability: None  . Transportation needs - medical: None  . Transportation needs - non-medical: None  Occupational History  . None  Tobacco Use  . Smoking status: Former  Smoker    Packs/day: 2.00    Years: 5.00    Pack years: 10.00    Types: Cigarettes    Last attempt to quit: 12/04/2000    Years since quitting: 17.0  . Smokeless tobacco: Never Used  Substance and Sexual Activity  . Alcohol use: Yes    Comment: weekends  . Drug use: None  . Sexual activity: None  Other Topics Concern  . None  Social History Narrative   Former smoker - quit 1998 after stent -    Lives with dtr and 2 g-sons   Separated from wife in 2005, good terms   Employed with [employer] - walks at lot at work   No regular exercise     Family History: The patient's family history includes Clotting disorder (age of onset: 46) in his sister; Diabetes in his brother, father, maternal grandmother, mother, other, and sister; Stroke (age of onset: 45) in his father. ROS:   Please see the history of present illness.     All other systems reviewed and are negative.  EKGs/Labs/Other Studies Reviewed:    The following studies were reviewed today:  Echocardiogram 12/20/2017 Study Conclusions - Left ventricle: The cavity size was severely dilated. There was   moderate concentric hypertrophy. Systolic function was severely   reduced. The estimated ejection fraction was in the range of 20%   to 25%. Diffuse hypokinesis. Doppler parameters are consistent   with abnormal left ventricular relaxation (grade 1 diastolic   dysfunction). There was no evidence of elevated ventricular   filling pressure by Doppler parameters. - Aortic valve: Valve area (VTI): 3.71 cm^2. Valve area (Vmax):   3.87 cm^2. Valve area (Vmean): 3.94 cm^2. - Mitral valve: There was mild regurgitation. - Left atrium: The atrium was moderately dilated. - Right ventricle: Systolic function was mildly reduced. - Tricuspid valve: There was trivial regurgitation. - Pulmonary arteries: Systolic pressure was within the normal   range. - Inferior vena cava: The vessel was normal in  size.  Impressions: - There is no significant change from the prior study on   09/16/2014.  Carotid duplex 12/20/2017 Right Carotid: Velocities in the right ICA are consistent with a 1-39% stenosis. Left Carotid: Velocities in the left ICA are consistent with a 1-39% stenosis. Vertebrals: Both vertebral arteries were patent with antegrade flow. Subclavians: Normal flow hemodynamics were seen in bilateral subclavian arteries.  EKG:  EKG is ordered today.  The ekg ordered today demonstrates sinus bradycardia 54 bpm with nonspecific IVCD, TWI in II,III,AVF as previously noted in 06/2017, Non-specific T chagnes in V5-6 not as prominent as in 06/2017. QRS is not as wide. QTC is noted to be long, but is not per my measurement.  Recent Labs: 12/19/2017: ALT 11; BUN 17; Creatinine, Ser 1.25; Hemoglobin 13.6; Platelets 258; Potassium 3.7; Sodium 138   Recent Lipid Panel  Component Value Date/Time   CHOL 160 12/20/2017 0859   TRIG 112 12/20/2017 0859   HDL 50 12/20/2017 0859   CHOLHDL 3.2 12/20/2017 0859   VLDL 22 12/20/2017 0859   LDLCALC 88 12/20/2017 0859   LDLDIRECT 135.3 06/22/2008 0911    Physical Exam:    VS:  BP 124/70   Pulse 66   Ht 6' 2.5" (1.892 m)   Wt 229 lb (103.9 kg)   SpO2 98%   BMI 29.01 kg/m     Wt Readings from Last 3 Encounters:  12/25/17 229 lb (103.9 kg)  12/20/17 223 lb 12.8 oz (101.5 kg)  06/26/17 233 lb (105.7 kg)     Physical Exam  Constitutional: He is oriented to person, place, and time. He appears well-developed and well-nourished. No distress.  HENT:  Head: Normocephalic and atraumatic.  Neck: Normal range of motion. Neck supple. No JVD present.  Cardiovascular: Normal rate, regular rhythm, normal heart sounds and intact distal pulses. Exam reveals no gallop and no friction rub.  No murmur heard. Pulmonary/Chest: Effort normal and breath sounds normal. No respiratory distress. He has no wheezes. He has no rales. He exhibits no tenderness.   Abdominal: Soft. Bowel sounds are normal. He exhibits no distension. There is no tenderness.  Musculoskeletal: Normal range of motion. He exhibits no edema or deformity.  Neurological: He is alert and oriented to person, place, and time.  Skin: Skin is warm and dry.  Psychiatric: He has a normal mood and affect. His behavior is normal. Thought content normal.    ASSESSMENT:    1. Cerebrovascular accident (CVA) due to embolism of right middle cerebral artery (HCC)   2. Nonischemic cardiomyopathy (HCC)   3. Essential hypertension   4. Dyslipidemia   5. Abnormal EKG   6. PVC (premature ventricular contraction)    PLAN:    In order of problems listed above:  1. Recent acute stroke:   right M2 high-grade stenosis, right posterior corona radiata infarct and chronic small left cerebellar infarct.  Symptoms resolved.  Aspirin 325 mg and Plavix 75 mg initiated by neurology. Per neurology stroke possibly related to LV dysfunction and low EF.  Another possible etiology is unidentified afib. Discussed case with Dr. Ladona Ridgel, DOD. Will place 30 day monitor to evaluate for arrhythmias and follow up with Dr. Ladona Ridgel for EP consult after monitor results in 6 weeks. If afib found, would need anticoagulation. If no afib found, consider loop recorder. No anticoagulation at this time per Dr. Ladona Ridgel. Pravastatin has been increased with LDL goal less than 70    2. Nonischemic cardiomyopathy: There was postulation by neurology that LV dysfunction with low EF may have been etiology of the stroke.  Continues to be well compensated.  He continues on carvedilol and ACE inhibitor therapy. Plan to repeat echocardiogram in 3 months  3. Hypertension:  blood pressure was modestly elevated in the ED and there was permissive hypertension in the acute phase of CVA.  Carvedilol and enalapril were resumed at discharge. BP on arrival today 142/78, rechecked by me after rest, 124/70. Continue current therapy.   4. Hyperlipidemia   LDL 88. statin has been increased in setting of acute stroke for goal LDL <70.  We will follow-up lipid panel in 6-8 weeks  Pt had mild renal impairment while in hospital. Will check Bmet today.    Medication Adjustments/Labs and Tests Ordered: Current medicines are reviewed at length with the patient today.  Concerns regarding medicines are outlined above. Labs  and tests ordered and medication changes are outlined in the patient instructions below:  Patient Instructions  Medication Instructions:  Your physician recommends that you continue on your current medications as directed. Please refer to the Current Medication list given to you today.   Labwork: TODAY: BMET  Testing/Procedures: Your physician has recommended that you wear an event monitor. Event monitors are medical devices that record the heart's electrical activity. Doctors most often [place] these monitors to diagnose arrhythmias. Arrhythmias are problems with the speed or rhythm of the heartbeat. The monitor is a small, portable device. You can wear one while you do your normal daily activities. This is usually used to diagnose what is causing palpitations/syncope (passing out).    Follow-Up: Your physician recommends that you schedule a follow-up appointment in 6 WEEKS WITH DR. Ladona Ridgel   Any Other Special Instructions Will Be Listed Below (If Applicable).     If you need a refill on your cardiac medications before your next appointment, please call your pharmacy.      Signed, Berton Bon, NP  12/25/2017 12:34 PM     [facility]

## 2017-12-25 ENCOUNTER — Encounter (INDEPENDENT_AMBULATORY_CARE_PROVIDER_SITE_OTHER): Payer: Self-pay

## 2017-12-25 ENCOUNTER — Encounter: Payer: Self-pay | Admitting: Physician Assistant

## 2017-12-25 ENCOUNTER — Ambulatory Visit: Payer: Managed Care, Other (non HMO) | Admitting: Cardiology

## 2017-12-25 ENCOUNTER — Telehealth: Payer: Self-pay | Admitting: Internal Medicine

## 2017-12-25 VITALS — BP 124/70 | HR 66 | Ht 74.5 in | Wt 229.0 lb

## 2017-12-25 DIAGNOSIS — I428 Other cardiomyopathies: Secondary | ICD-10-CM | POA: Diagnosis not present

## 2017-12-25 DIAGNOSIS — R9431 Abnormal electrocardiogram [ECG] [EKG]: Secondary | ICD-10-CM

## 2017-12-25 DIAGNOSIS — I493 Ventricular premature depolarization: Secondary | ICD-10-CM | POA: Diagnosis not present

## 2017-12-25 DIAGNOSIS — I63411 Cerebral infarction due to embolism of right middle cerebral artery: Secondary | ICD-10-CM | POA: Diagnosis not present

## 2017-12-25 DIAGNOSIS — E785 Hyperlipidemia, unspecified: Secondary | ICD-10-CM | POA: Diagnosis not present

## 2017-12-25 DIAGNOSIS — I10 Essential (primary) hypertension: Secondary | ICD-10-CM

## 2017-12-25 LAB — BASIC METABOLIC PANEL
BUN / CREAT RATIO: 12 (ref 10–24)
BUN: 15 mg/dL (ref 8–27)
CO2: 25 mmol/L (ref 20–29)
CREATININE: 1.21 mg/dL (ref 0.76–1.27)
Calcium: 9.8 mg/dL (ref 8.6–10.2)
Chloride: 99 mmol/L (ref 96–106)
GFR calc non Af Amer: 63 mL/min/{1.73_m2} (ref >59)
GFR, EST AFRICAN AMERICAN: 73 mL/min/{1.73_m2} (ref >59)
GLUCOSE: 143 mg/dL — AB (ref 65–99)
Potassium: 4.6 mmol/L (ref 3.5–5.2)
SODIUM: 135 mmol/L (ref 134–144)

## 2017-12-25 NOTE — Telephone Encounter (Signed)
New message    Pt would like a call from nurse to talk about him going out of work. Please call.

## 2017-12-25 NOTE — Telephone Encounter (Signed)
Dr. Ladona Ridgel spoke with Pt.  Notified Pt he can continue to work.

## 2017-12-25 NOTE — Patient Instructions (Addendum)
Medication Instructions:  Your physician recommends that you continue on your current medications as directed. Please refer to the Current Medication list given to you today.   Labwork: TODAY: BMET  Testing/Procedures: Your physician has recommended that you wear an event monitor. Event monitors are medical devices that record the heart's electrical activity. Doctors most often [place] these monitors to diagnose arrhythmias. Arrhythmias are problems with the speed or rhythm of the heartbeat. The monitor is a small, portable device. You can wear one while you do your normal daily activities. This is usually used to diagnose what is causing palpitations/syncope (passing out).    Follow-Up: Your physician recommends that you schedule a follow-up appointment in 6 WEEKS WITH DR. Ladona Ridgel   Any Other Special Instructions Will Be Listed Below (If Applicable).     If you need a refill on your cardiac medications before your next appointment, please call your pharmacy.

## 2017-12-28 ENCOUNTER — Ambulatory Visit (INDEPENDENT_AMBULATORY_CARE_PROVIDER_SITE_OTHER): Payer: Managed Care, Other (non HMO)

## 2017-12-28 ENCOUNTER — Other Ambulatory Visit: Payer: Self-pay | Admitting: Cardiology

## 2017-12-28 DIAGNOSIS — R9431 Abnormal electrocardiogram [ECG] [EKG]: Secondary | ICD-10-CM

## 2017-12-28 DIAGNOSIS — I493 Ventricular premature depolarization: Secondary | ICD-10-CM

## 2017-12-28 DIAGNOSIS — I4891 Unspecified atrial fibrillation: Secondary | ICD-10-CM

## 2018-01-04 ENCOUNTER — Other Ambulatory Visit: Payer: Self-pay | Admitting: Internal Medicine

## 2018-01-21 ENCOUNTER — Other Ambulatory Visit: Payer: Self-pay | Admitting: Emergency Medicine

## 2018-01-21 MED ORDER — CLOPIDOGREL BISULFATE 75 MG PO TABS: 75.0000 mg | ORAL_TABLET | Freq: Every day | ORAL | 0 refills | Status: DC

## 2018-02-02 ENCOUNTER — Other Ambulatory Visit: Payer: Self-pay | Admitting: Internal Medicine

## 2018-02-05 ENCOUNTER — Encounter: Payer: Self-pay | Admitting: Internal Medicine

## 2018-02-05 ENCOUNTER — Encounter (INDEPENDENT_AMBULATORY_CARE_PROVIDER_SITE_OTHER): Payer: Self-pay

## 2018-02-05 ENCOUNTER — Ambulatory Visit: Payer: Managed Care, Other (non HMO) | Admitting: Internal Medicine

## 2018-02-05 ENCOUNTER — Telehealth: Payer: Self-pay | Admitting: Internal Medicine

## 2018-02-05 VITALS — BP 136/80 | HR 69 | Ht 74.5 in | Wt 233.0 lb

## 2018-02-05 DIAGNOSIS — I428 Other cardiomyopathies: Secondary | ICD-10-CM | POA: Diagnosis not present

## 2018-02-05 DIAGNOSIS — I10 Essential (primary) hypertension: Secondary | ICD-10-CM | POA: Diagnosis not present

## 2018-02-05 DIAGNOSIS — I63411 Cerebral infarction due to embolism of right middle cerebral artery: Secondary | ICD-10-CM

## 2018-02-05 MED ORDER — METFORMIN HCL 1000 MG PO TABS: ORAL_TABLET | ORAL | 0 refills | Status: DC

## 2018-02-05 MED ORDER — WARFARIN SODIUM 5 MG PO TABS: 5.0000 mg | ORAL_TABLET | Freq: Every day | ORAL | 3 refills | Status: DC

## 2018-02-05 NOTE — Progress Notes (Signed)
HPI [name] returns today for followup of his recent stroke. He is a pleasant 62 yo man with chronic systolic heart failure and severe LV dysfunction who suffered a stroke just over a month ago. He has had almost full recovery. He has been on asa and plavix but the neurology service recommended he be transitioned to warfarin. He cannot afford an OAC. He denies chest pain or sob. No syncope. No edema. Allergies  Allergen Reactions  . Lipitor [Atorvastatin] Nausea And Vomiting     Current Outpatient Medications  Medication Sig Dispense Refill  . aspirin 325 MG tablet Take 1 tablet (325 mg total) by mouth daily. 30 tablet 0  . carvedilol (COREG) 12.5 MG tablet TAKE 1 & 1/2 (ONE & ONE-HALF) TABLETS BY MOUTH TWICE DAILY 270 tablet 2  . clopidogrel (PLAVIX) 75 MG tablet Take 1 tablet (75 mg total) by mouth daily. --- Office visit needed for further refills 30 tablet 0  . enalapril (VASOTEC) 5 MG tablet TAKE ONE TABLET BY MOUTH TWICE DAILY 60 tablet 11  . glimepiride (AMARYL) 2 MG tablet TAKE ONE TABLET BY MOUTH ONCE DAILY WITH BREAKFAST 90 tablet 1  . metFORMIN (GLUCOPHAGE) 1000 MG tablet TAKE 1 TABLET BY MOUTH TWICE DAILY WITH A MEAL --OFFICE  VISIT  NEEDED  FOR  FURTHER  REFILLS 60 tablet 0  . ONE TOUCH ULTRA TEST test strip 1 each by Other route 2 (two) times daily.     Letta Pate DELICA LANCETS 33G MISC 1 each 2 (two) times daily.     . pravastatin (PRAVACHOL) 80 MG tablet Take 1 tablet (80 mg total) by mouth daily at 6 PM. 30 tablet 0  . tamsulosin (FLOMAX) 0.4 MG CAPS capsule Take 1 capsule (0.4 mg total) by mouth daily after supper. 90 capsule 1   No current facility-administered medications for this visit.      Past Medical History:  Diagnosis Date  . Coronary artery disease   . Diabetes mellitus (HCC) 07/2014 dx  . Hypertension     ROS:   All systems reviewed and negative except as noted in the HPI.   Past Surgical History:  Procedure Laterality Date  . CORONARY  STENT PLACEMENT  1998     Family History  Problem Relation Age of Onset  . Diabetes Mother   . Diabetes Father   . Stroke Father 57  . Diabetes Sister   . Clotting disorder Sister 74  . Diabetes Other        nephew  . Diabetes Brother   . Diabetes Maternal Grandmother      Social History   Socioeconomic History  . Marital status: Married    Spouse name: Not on file  . Number of children: Not on file  . Years of education: Not on file  . Highest education level: Not on file  Social Needs  . Financial resource strain: Not on file  . Food insecurity - worry: Not on file  . Food insecurity - inability: Not on file  . Transportation needs - medical: Not on file  . Transportation needs - non-medical: Not on file  Occupational History  . Not on file  Tobacco Use  . Smoking status: Former Smoker    Packs/day: 2.00    Years: 5.00    Pack years: 10.00    Types: Cigarettes    Last attempt to quit: 12/04/2000    Years since quitting: 17.1  . Smokeless tobacco: Never Used  Substance and Sexual Activity  . Alcohol use: Yes    Comment: weekends  . Drug use: Not on file  . Sexual activity: Not on file  Other Topics Concern  . Not on file  Social History Narrative   Former smoker - quit 1998 after stent -    Lives with dtr and 2 g-sons   Separated from wife in 2005, good terms   Employed with [employer] - walks at lot at work   No regular exercise     BP 136/80   Pulse 69   Ht 6' 2.5" (1.892 m)   Wt 233 lb (105.7 kg)   BMI 29.52 kg/m   Physical Exam:  Well appearing 62 yo man, NAD HEENT: Unremarkable Neck:  6 cm JVD, no thyromegally Lymphatics:  No adenopathy Back:  No CVA tenderness Lungs:  Clear with no wheezes  HEART:  Regular rate rhythm, no murmurs, no rubs, no clicks Abd:  soft, positive bowel sounds, no organomegally, no rebound, no guarding Ext:  2 plus pulses, no edema, no cyanosis, no clubbing Skin:  No rashes no  nodules Neuro:  CN II through XII intact, motor grossly intact  DEVICE  Normal device function.  See PaceArt for details.   Assess/Plan: 1. Chronic systolic heart failure - his symptoms are class 2. He has refused ICD insertion. 2. Stroke - he was thought to have had an embolic stroke and neurology recommended he be started on warfarin. He will stop ASA and plavix 3 days after starting the warfarin.  3. HTN - his blood pressure is reasonably well controlled. He is encouraged to continue weight loss. 4. Obesity - he is losing weight. I have encouraged him to try and get below 200 lbs.  Leonia Reeves.D.

## 2018-02-05 NOTE — Telephone Encounter (Signed)
Pt came by the office stating that he is needing a refill on his Metformin. He is scheduled to see Dr Lawerance Bach tomorrow for this refill but ran out last night. Can a small supply be sent to his pharmacy for today and tomorrow?

## 2018-02-05 NOTE — Patient Instructions (Addendum)
Medication Instructions:  Your physician has recommended you make the following change in your medication:  1.  START taking warfarin 5 mg one tablet by mouth daily (in the evening). 2.  After 3 days of taking warfarin- STOP taking ASPIRIN and PLAVIX.  Labwork: None ordered.  Testing/Procedures: Refer to [facility] for follow up for new warfarin start.  Please schedule.  Follow-Up: Your physician wants you to follow-up in: 6 months with Dr. Ladona Ridgel.   You will receive a reminder letter in the mail two months in advance. If you don't receive a letter, please call our office to schedule the follow-up appointment.  Any Other Special Instructions Will Be Listed Below (If Applicable).  If you need a refill on your cardiac medications before your next appointment, please call your pharmacy.

## 2018-02-06 ENCOUNTER — Ambulatory Visit: Payer: Managed Care, Other (non HMO) | Admitting: Internal Medicine

## 2018-02-06 ENCOUNTER — Encounter: Payer: Self-pay | Admitting: Internal Medicine

## 2018-02-06 VITALS — BP 128/80 | HR 67 | Temp 98.1°F | Resp 16 | Wt 233.0 lb

## 2018-02-06 DIAGNOSIS — Z8673 Personal history of transient ischemic attack (TIA), and cerebral infarction without residual deficits: Secondary | ICD-10-CM | POA: Diagnosis not present

## 2018-02-06 DIAGNOSIS — I2510 Atherosclerotic heart disease of native coronary artery without angina pectoris: Secondary | ICD-10-CM | POA: Diagnosis not present

## 2018-02-06 DIAGNOSIS — N400 Enlarged prostate without lower urinary tract symptoms: Secondary | ICD-10-CM

## 2018-02-06 DIAGNOSIS — E785 Hyperlipidemia, unspecified: Secondary | ICD-10-CM

## 2018-02-06 DIAGNOSIS — E1151 Type 2 diabetes mellitus with diabetic peripheral angiopathy without gangrene: Secondary | ICD-10-CM | POA: Diagnosis not present

## 2018-02-06 DIAGNOSIS — IMO0002 Reserved for concepts with insufficient information to code with codable children: Secondary | ICD-10-CM

## 2018-02-06 DIAGNOSIS — E1165 Type 2 diabetes mellitus with hyperglycemia: Secondary | ICD-10-CM

## 2018-02-06 MED ORDER — DAPAGLIFLOZIN PROPANEDIOL 5 MG PO TABS: 5.0000 mg | ORAL_TABLET | Freq: Every day | ORAL | 5 refills | Status: DC

## 2018-02-06 MED ORDER — TAMSULOSIN HCL 0.4 MG PO CAPS: 0.4000 mg | ORAL_CAPSULE | Freq: Every day | ORAL | 1 refills | Status: DC

## 2018-02-06 MED ORDER — METFORMIN HCL 1000 MG PO TABS: ORAL_TABLET | ORAL | 1 refills | Status: DC

## 2018-02-06 NOTE — Patient Instructions (Addendum)
   Medications reviewed and updated.  Changes include starting farxiga and stopping the glimepiride if the farxiga is not too expensive.   Continue your other medications.   Your prescription(s) have been submitted to your pharmacy. Please take as directed and contact our office if you believe you are having problem(s) with the medication(s).   Please followup in 6 months

## 2018-02-06 NOTE — Assessment & Plan Note (Signed)
Continue statin Cholesterol checked recently and will recheck in 6 months

## 2018-02-06 NOTE — Assessment & Plan Note (Signed)
Controlled Continue daily Flomax

## 2018-02-06 NOTE — Assessment & Plan Note (Signed)
No chest pain, palpitations, shortness of breath Following with cardiology Continue current medications

## 2018-02-06 NOTE — Assessment & Plan Note (Signed)
Lab Results  Component Value Date   HGBA1C 6.5 (H) 12/20/2017   Recent A1c 6.5% and well controlled Sugars at home relatively controlled We will continue metformin at current dose If farxiga covered by insurance he will start this and stop the glimepiride.  If it is not covered he will continue his current medications Continue diabetic diet and increased activity Follow-up in 6 months He will schedule an eye exam

## 2018-02-06 NOTE — Progress Notes (Signed)
Subjective:    Patient ID: Karl [name], male    DOB: 1956/10/16, 62 y.o.   MRN: [id number]  HPI The patient is here for follow up.    Diabetes: He is taking his medication daily as prescribed. He is compliant with a diabetic diet. He is very active at work, but not exercising regularly. He monitors his sugars and they have been running 143, 80, 94. He checks his feet daily and denies foot lesions. He is not up-to-date with an ophthalmology examination.   Hyperlipidemia: He is taking his medication daily. He is compliant with a low fat/cholesterol diet. He is active, but not exercising regularly. He denies myalgias.   CAD, h/o CVA, chronic systolic heart failure ( ef 20-25%): Since he was here last he did have a stroke.  He was transitioned from aspirin and Plavix to warfarin.  He denies any residual deficits from his stroke.  He understands the importance of taking his medication on a daily basis.  He is up-to-date with cardiology and was recently seen by them.  He denies any chest pain, palpitations, shortness of breath.  He denies leg edema and lightheadedness.  Overall he feels well.  BPH without obstruction: He is taking his Flomax daily as prescribed.  He denies difficulty urinating and feels that this is well controlled.  Medications and allergies reviewed with patient and updated if appropriate.  Patient Active Problem List   Diagnosis Date Noted  . CVA (cerebral vascular accident) (HCC) 12/20/2017  . Coronary artery disease 12/19/2017  . Mild renal insufficiency 12/19/2017  . TIA (transient ischemic attack) 12/19/2017  . Hip flexor tendinitis 05/22/2017  . Abscess of back 05/22/2017  . Pain of both hip joints 04/25/2017  . Dyslipidemia 09/01/2015  . BPH without urinary obstruction 09/01/2015  . DM (diabetes mellitus), type 2, uncontrolled, periph vascular complic (HCC) 07/10/2014  . Essential hypertension 04/20/2009  . CARDIOMYOPATHY, ISCHEMIC 04/20/2009  . LEFT BUNDLE  BRANCH BLOCK 04/20/2009  . Chronic systolic heart failure (HCC) 04/20/2009    Current Outpatient Medications on File Prior to Visit  Medication Sig Dispense Refill  . carvedilol (COREG) 12.5 MG tablet TAKE 1 & 1/2 (ONE & ONE-HALF) TABLETS BY MOUTH TWICE DAILY 270 tablet 2  . enalapril (VASOTEC) 5 MG tablet TAKE ONE TABLET BY MOUTH TWICE DAILY 60 tablet 11  . glimepiride (AMARYL) 2 MG tablet TAKE ONE TABLET BY MOUTH ONCE DAILY WITH BREAKFAST 90 tablet 1  . metFORMIN (GLUCOPHAGE) 1000 MG tablet TAKE 1 TABLET BY MOUTH TWICE DAILY WITH A MEAL 10 tablet 0  . ONE TOUCH ULTRA TEST test strip 1 each by Other route 2 (two) times daily.     Letta Pate DELICA LANCETS 33G MISC 1 each 2 (two) times daily.     . pravastatin (PRAVACHOL) 80 MG tablet Take 1 tablet (80 mg total) by mouth daily at 6 PM. 30 tablet 0  . tamsulosin (FLOMAX) 0.4 MG CAPS capsule Take 1 capsule (0.4 mg total) by mouth daily after supper. 90 capsule 1  . warfarin (COUMADIN) 5 MG tablet Take 1 tablet (5 mg total) by mouth daily. 90 tablet 3   No current facility-administered medications on file prior to visit.     Past Medical History:  Diagnosis Date  . Coronary artery disease   . Diabetes mellitus (HCC) 07/2014 dx  . Hypertension     Past Surgical History:  Procedure Laterality Date  . CORONARY STENT PLACEMENT  1998    Social History  Socioeconomic History  . Marital status: Married    Spouse name: None  . Number of children: None  . Years of education: None  . Highest education level: None  Social Needs  . Financial resource strain: None  . Food insecurity - worry: None  . Food insecurity - inability: None  . Transportation needs - medical: None  . Transportation needs - non-medical: None  Occupational History  . None  Tobacco Use  . Smoking status: Former Smoker    Packs/day: 2.00    Years: 5.00    Pack years: 10.00    Types: Cigarettes    Last attempt to quit: 12/04/2000    Years since quitting: 17.1    . Smokeless tobacco: Never Used  Substance and Sexual Activity  . Alcohol use: Yes    Comment: weekends  . Drug use: None  . Sexual activity: None  Other Topics Concern  . None  Social History Narrative   Former smoker - quit 1998 after stent -    Lives with dtr and 2 g-sons   Separated from wife in 2005, good terms   Employed with [employer] - walks at lot at work   No regular exercise    Family History  Problem Relation Age of Onset  . Diabetes Mother   . Diabetes Father   . Stroke Father 37  . Diabetes Sister   . Clotting disorder Sister 43  . Diabetes Other        nephew  . Diabetes Brother   . Diabetes Maternal Grandmother     Review of Systems  Constitutional: Negative for chills and fever.  Respiratory: Negative for cough, shortness of breath and wheezing.   Cardiovascular: Negative for chest pain, palpitations and leg swelling.  Musculoskeletal: Negative for gait problem.  Neurological: Negative for dizziness, weakness, light-headedness, numbness and headaches.       Objective:   Vitals:   02/06/18 1548  BP: 128/80  Pulse: 67  Resp: 16  Temp: 98.1 F (36.7 C)  SpO2: 98%   Wt Readings from Last 3 Encounters:  02/06/18 233 lb (105.7 kg)  02/05/18 233 lb (105.7 kg)  12/25/17 229 lb (103.9 kg)   Body mass index is 29.52 kg/m.   Physical Exam    Constitutional: Appears well-developed and well-nourished. No distress.  HENT:  Head: Normocephalic and atraumatic.  Neck: Neck supple. No tracheal deviation present. No thyromegaly present.  No cervical lymphadenopathy Cardiovascular: Normal rate, regular rhythm and normal heart sounds.   No murmur heard. No carotid bruit .  No edema Pulmonary/Chest: Effort normal and breath sounds normal. No respiratory distress. No has no wheezes. No rales.  Skin: Skin is warm and dry. Not diaphoretic.  Psychiatric: Normal mood and affect. Behavior is normal.   Diabetic Foot Exam - Simple    Simple Foot Form Diabetic Foot exam was performed with the following findings:  Yes 02/06/2018  4:02 PM  Visual Inspection No deformities, no ulcerations, no other skin breakdown bilaterally:  Yes Sensation Testing Intact to touch and monofilament testing bilaterally:  Yes Pulse Check Posterior Tibialis and Dorsalis pulse intact bilaterally:  Yes Comments       Assessment & Plan:    See Problem List for Assessment and Plan of chronic medical problems.

## 2018-02-06 NOTE — Assessment & Plan Note (Signed)
Was taking aspirin and Plavix-switched to warfarin Taking statin Stressed the importance of keeping his sugars well controlled and blood pressure well controlled Stressed taking his medication on a daily basis

## 2018-02-08 ENCOUNTER — Ambulatory Visit (INDEPENDENT_AMBULATORY_CARE_PROVIDER_SITE_OTHER): Payer: Managed Care, Other (non HMO) | Admitting: Pharmacist

## 2018-02-08 DIAGNOSIS — G459 Transient cerebral ischemic attack, unspecified: Secondary | ICD-10-CM | POA: Diagnosis not present

## 2018-02-08 DIAGNOSIS — Z8673 Personal history of transient ischemic attack (TIA), and cerebral infarction without residual deficits: Secondary | ICD-10-CM | POA: Diagnosis not present

## 2018-02-08 DIAGNOSIS — Z5181 Encounter for therapeutic drug level monitoring: Secondary | ICD-10-CM | POA: Diagnosis not present

## 2018-02-08 LAB — POCT INR: INR: 1.1

## 2018-02-08 NOTE — Patient Instructions (Signed)
Description   Start taking 1.5 tablets (7.5mg ) daily. Recheck INR in 5-7 days. Call the Coumadin clinic with any questions #[phone].    A full discussion of the nature of anticoagulants has been carried out.  A benefit risk analysis has been presented to the patient, so that they understand the justification for choosing anticoagulation at this time. The need for frequent and regular monitoring, precise dosage adjustment and compliance is stressed.  Side effects of potential bleeding are discussed.  The patient should avoid any OTC items containing aspirin or ibuprofen, and should avoid great swings in general diet.  Avoid alcohol consumption.  Call if any signs of abnormal bleeding.

## 2018-02-11 ENCOUNTER — Other Ambulatory Visit: Payer: Self-pay | Admitting: Internal Medicine

## 2018-02-11 NOTE — Telephone Encounter (Signed)
Copied from CRM [id number]. Topic: Quick Communication - Rx Refill/Question >> Feb 11, 2018  4:35 PM Raquel Sarna wrote: Glimepiride 2 mg  Pt is low. Need refills ASAP  [facility] [address] [place] - [address] [place] [address] Phone: [phone] Fax: [phone]

## 2018-02-12 NOTE — Telephone Encounter (Signed)
LVM with pts wife to inform that RX was changed at visit and was to contact office if RX was too expensive

## 2018-02-13 NOTE — Telephone Encounter (Signed)
Pts wife aware of medication change. Pt thought he was suppose to take both, informed wife that in doctors note from his last appt he was to discontinue the Glimepiride.

## 2018-02-14 ENCOUNTER — Ambulatory Visit (INDEPENDENT_AMBULATORY_CARE_PROVIDER_SITE_OTHER): Payer: Managed Care, Other (non HMO) | Admitting: *Deleted

## 2018-02-14 DIAGNOSIS — Z5181 Encounter for therapeutic drug level monitoring: Secondary | ICD-10-CM

## 2018-02-14 DIAGNOSIS — Z8673 Personal history of transient ischemic attack (TIA), and cerebral infarction without residual deficits: Secondary | ICD-10-CM | POA: Diagnosis not present

## 2018-02-14 DIAGNOSIS — G459 Transient cerebral ischemic attack, unspecified: Secondary | ICD-10-CM | POA: Diagnosis not present

## 2018-02-14 LAB — POCT INR: INR: 1.2

## 2018-02-14 NOTE — Patient Instructions (Signed)
Description   Change dose of coumadin to 2 tablets (10mg ) daily. Recheck INR in 1 week. Call the Coumadin clinic with any questions #[phone].

## 2018-02-19 ENCOUNTER — Ambulatory Visit: Payer: No Typology Code available for payment source | Admitting: Neurology

## 2018-02-19 ENCOUNTER — Encounter: Payer: Self-pay | Admitting: Neurology

## 2018-02-19 ENCOUNTER — Telehealth: Payer: Self-pay

## 2018-02-19 NOTE — Telephone Encounter (Signed)
Patient no show for appt today. 

## 2018-02-21 ENCOUNTER — Ambulatory Visit (INDEPENDENT_AMBULATORY_CARE_PROVIDER_SITE_OTHER): Payer: Managed Care, Other (non HMO) | Admitting: *Deleted

## 2018-02-21 DIAGNOSIS — G459 Transient cerebral ischemic attack, unspecified: Secondary | ICD-10-CM

## 2018-02-21 DIAGNOSIS — Z8673 Personal history of transient ischemic attack (TIA), and cerebral infarction without residual deficits: Secondary | ICD-10-CM | POA: Diagnosis not present

## 2018-02-21 DIAGNOSIS — Z5181 Encounter for therapeutic drug level monitoring: Secondary | ICD-10-CM

## 2018-02-21 LAB — POCT INR: INR: 2.5

## 2018-02-21 NOTE — Patient Instructions (Signed)
Description   Continue taking 2 tablets (10mg ) daily. Recheck INR in 1 week. Call the Coumadin clinic with any questions #[phone].

## 2018-02-28 ENCOUNTER — Ambulatory Visit (INDEPENDENT_AMBULATORY_CARE_PROVIDER_SITE_OTHER): Payer: Managed Care, Other (non HMO) | Admitting: *Deleted

## 2018-02-28 DIAGNOSIS — Z5181 Encounter for therapeutic drug level monitoring: Secondary | ICD-10-CM

## 2018-02-28 DIAGNOSIS — G459 Transient cerebral ischemic attack, unspecified: Secondary | ICD-10-CM | POA: Diagnosis not present

## 2018-02-28 DIAGNOSIS — Z8673 Personal history of transient ischemic attack (TIA), and cerebral infarction without residual deficits: Secondary | ICD-10-CM | POA: Diagnosis not present

## 2018-02-28 LAB — POCT INR: INR: 2.8

## 2018-02-28 NOTE — Patient Instructions (Signed)
Description   Continue taking 2 tablets (10mg ) daily. Recheck INR in 11 days.  Call the Coumadin clinic with any questions #[phone].

## 2018-03-11 ENCOUNTER — Ambulatory Visit (INDEPENDENT_AMBULATORY_CARE_PROVIDER_SITE_OTHER): Payer: Managed Care, Other (non HMO) | Admitting: *Deleted

## 2018-03-11 DIAGNOSIS — Z8673 Personal history of transient ischemic attack (TIA), and cerebral infarction without residual deficits: Secondary | ICD-10-CM | POA: Diagnosis not present

## 2018-03-11 DIAGNOSIS — G459 Transient cerebral ischemic attack, unspecified: Secondary | ICD-10-CM

## 2018-03-11 DIAGNOSIS — Z5181 Encounter for therapeutic drug level monitoring: Secondary | ICD-10-CM | POA: Diagnosis not present

## 2018-03-11 LAB — POCT INR: INR: 3.3

## 2018-03-11 NOTE — Patient Instructions (Signed)
Description   Eat a serving of leafy green vegetable today, on Tomorrow only take 1 tablet, then  Continue taking 2 tablets (10mg ) daily. Recheck INR in 2 weeks. Increase your leafy green intake to 3 servings each.  Call the Coumadin clinic with any questions #[phone].

## 2018-03-25 ENCOUNTER — Ambulatory Visit: Payer: Managed Care, Other (non HMO) | Admitting: Pharmacist

## 2018-03-25 DIAGNOSIS — Z5181 Encounter for therapeutic drug level monitoring: Secondary | ICD-10-CM | POA: Diagnosis not present

## 2018-03-25 DIAGNOSIS — Z8673 Personal history of transient ischemic attack (TIA), and cerebral infarction without residual deficits: Secondary | ICD-10-CM | POA: Diagnosis not present

## 2018-03-25 DIAGNOSIS — G459 Transient cerebral ischemic attack, unspecified: Secondary | ICD-10-CM

## 2018-03-25 LAB — POCT INR: INR: 2.8

## 2018-03-25 MED ORDER — WARFARIN SODIUM 10 MG PO TABS: ORAL_TABLET | ORAL | 2 refills | Status: DC

## 2018-03-25 NOTE — Patient Instructions (Signed)
Description   Continue taking 10mg  each day - your refill will be for the 10mg  tablet to take 1 daily. Recheck INR in 2 weeks. Increase your leafy green intake to 3 servings each.  Call the Coumadin clinic with any questions #[phone].

## 2018-03-31 ENCOUNTER — Emergency Department (HOSPITAL_COMMUNITY)
Admission: EM | Admit: 2018-03-31 | Discharge: 2018-03-31 | Disposition: A | Discharge status: Home / Self Care | Payer: Managed Care, Other (non HMO) | Attending: Emergency Medicine | Admitting: Emergency Medicine

## 2018-03-31 ENCOUNTER — Encounter (HOSPITAL_COMMUNITY): Payer: Self-pay | Admitting: Emergency Medicine

## 2018-03-31 DIAGNOSIS — R339 Retention of urine, unspecified: Secondary | ICD-10-CM | POA: Insufficient documentation

## 2018-03-31 DIAGNOSIS — Z7901 Long term (current) use of anticoagulants: Secondary | ICD-10-CM | POA: Diagnosis not present

## 2018-03-31 DIAGNOSIS — R338 Other retention of urine: Secondary | ICD-10-CM

## 2018-03-31 DIAGNOSIS — I110 Hypertensive heart disease with heart failure: Secondary | ICD-10-CM | POA: Diagnosis not present

## 2018-03-31 DIAGNOSIS — I2510 Atherosclerotic heart disease of native coronary artery without angina pectoris: Secondary | ICD-10-CM | POA: Insufficient documentation

## 2018-03-31 DIAGNOSIS — E119 Type 2 diabetes mellitus without complications: Secondary | ICD-10-CM | POA: Insufficient documentation

## 2018-03-31 DIAGNOSIS — Z87891 Personal history of nicotine dependence: Secondary | ICD-10-CM | POA: Insufficient documentation

## 2018-03-31 DIAGNOSIS — I5022 Chronic systolic (congestive) heart failure: Secondary | ICD-10-CM | POA: Diagnosis not present

## 2018-03-31 DIAGNOSIS — Z7984 Long term (current) use of oral hypoglycemic drugs: Secondary | ICD-10-CM | POA: Insufficient documentation

## 2018-03-31 DIAGNOSIS — Z79899 Other long term (current) drug therapy: Secondary | ICD-10-CM | POA: Insufficient documentation

## 2018-03-31 LAB — COMPREHENSIVE METABOLIC PANEL
ALBUMIN: 4.2 g/dL (ref 3.5–5.0)
ALK PHOS: 64 U/L (ref 38–126)
ALT: 16 U/L — AB (ref 17–63)
AST: 28 U/L (ref 15–41)
Anion gap: 12 (ref 5–15)
BILIRUBIN TOTAL: 1.3 mg/dL — AB (ref 0.3–1.2)
BUN: 13 mg/dL (ref 6–20)
CO2: 22 mmol/L (ref 22–32)
CREATININE: 1.2 mg/dL (ref 0.61–1.24)
Calcium: 9.5 mg/dL (ref 8.9–10.3)
Chloride: 102 mmol/L (ref 101–111)
GFR calc Af Amer: >60 mL/min (ref >60)
GLUCOSE: 206 mg/dL — AB (ref 65–99)
Potassium: 4.5 mmol/L (ref 3.5–5.1)
Sodium: 136 mmol/L (ref 135–145)
TOTAL PROTEIN: 8.3 g/dL — AB (ref 6.5–8.1)

## 2018-03-31 LAB — CBC
HCT: 46.8 % (ref 39.0–52.0)
Hemoglobin: 15.7 g/dL (ref 13.0–17.0)
MCH: 26 pg (ref 26.0–34.0)
MCHC: 33.5 g/dL (ref 30.0–36.0)
MCV: 77.4 fL — ABNORMAL LOW (ref 78.0–100.0)
PLATELETS: 329 10*3/uL (ref 150–400)
RBC: 6.05 MIL/uL — ABNORMAL HIGH (ref 4.22–5.81)
RDW: 14.1 % (ref 11.5–15.5)
WBC: 10.7 10*3/uL — AB (ref 4.0–10.5)

## 2018-03-31 LAB — URINALYSIS, ROUTINE W REFLEX MICROSCOPIC
BACTERIA UA: NONE SEEN
Bilirubin Urine: NEGATIVE
KETONES UR: 5 mg/dL — AB
LEUKOCYTES UA: NEGATIVE
NITRITE: NEGATIVE
PH: 7 (ref 5.0–8.0)
PROTEIN: NEGATIVE mg/dL
RBC / HPF: >50 RBC/hpf — ABNORMAL HIGH (ref 0–5)
Specific Gravity, Urine: 1.011 (ref 1.005–1.030)

## 2018-03-31 LAB — LIPASE, BLOOD: Lipase: 21 U/L (ref 11–51)

## 2018-03-31 NOTE — Discharge Instructions (Addendum)
Please call and follow up with urologist next week for further management of your foley and further reassessment of your acute urinary retention.

## 2018-03-31 NOTE — ED Provider Notes (Signed)
[facility] EMERGENCY DEPARTMENT Provider Note   CSN: [id number] Arrival date & time: 03/31/18  1249     History   Chief Complaint Chief Complaint  Patient presents with  . Urinary Retention  . Emesis  . pain with urination  . Abdominal Pain    HPI Karl [name] is a 62 y.o. male.  HPI   62 year old male with history of BPH, diabetes, renal insufficiency, CVA, CAD, CHF presenting complaining of urinary retention.  Patient reports since midnight of last night he has had trouble with urinary retention.  States that he has the urge to urinate but unable to urinate.  He endorsed a lot of pressure in his abdomen, and feels very uncomfortable.  He denies any specific treatment tried.  Rates pain as moderate to severe at this time.  Did vomit once this morning when pain was intense.  No planes of fever chills, he was fine the day before.  Denies any difficulty with bowel movement.  Last bowel movement was yesterday.  He did have one similar episode like this a year ago and had a Foley on for approximately 4 to 5 weeks.  Past Medical History:  Diagnosis Date  . Coronary artery disease   . Diabetes mellitus (HCC) 07/2014 dx  . Hypertension     Patient Active Problem List   Diagnosis Date Noted  . Encounter for therapeutic drug monitoring 02/08/2018  . History of CVA (cerebrovascular accident) 12/20/2017  . Coronary artery disease 12/19/2017  . Mild renal insufficiency 12/19/2017  . TIA (transient ischemic attack) 12/19/2017  . Hip flexor tendinitis 05/22/2017  . Abscess of back 05/22/2017  . Pain of both hip joints 04/25/2017  . Dyslipidemia 09/01/2015  . BPH without urinary obstruction 09/01/2015  . DM (diabetes mellitus), type 2, uncontrolled, periph vascular complic (HCC) 07/10/2014  . Essential hypertension 04/20/2009  . CARDIOMYOPATHY, ISCHEMIC 04/20/2009  . LEFT BUNDLE BRANCH BLOCK 04/20/2009  . Chronic systolic heart failure (HCC) 04/20/2009    Past  Surgical History:  Procedure Laterality Date  . CORONARY STENT PLACEMENT  1998        Home Medications    Prior to Admission medications   Medication Sig Start Date End Date Taking? Authorizing Provider  carvedilol (COREG) 12.5 MG tablet TAKE 1 & 1/2 (ONE & ONE-HALF) TABLETS BY MOUTH TWICE DAILY 10/16/17   Marinus Maw, MD  dapagliflozin propanediol (FARXIGA) 5 MG TABS tablet Take 5 mg by mouth daily. 02/06/18   Pincus Sanes, MD  enalapril (VASOTEC) 5 MG tablet TAKE ONE TABLET BY MOUTH TWICE DAILY 04/19/17   Marinus Maw, MD  metFORMIN (GLUCOPHAGE) 1000 MG tablet TAKE 1 TABLET BY MOUTH TWICE DAILY WITH A MEAL 02/06/18   Burns, Bobette Mo, MD  ONE TOUCH ULTRA TEST test strip 1 each by Other route 2 (two) times daily.  08/05/14   [provider]  Dola Argyle LANCETS 33G MISC 1 each 2 (two) times daily.  08/07/14   [provider]  pravastatin (PRAVACHOL) 80 MG tablet Take 1 tablet (80 mg total) by mouth daily at 6 PM. 12/20/17   Joseph Art, DO  tamsulosin (FLOMAX) 0.4 MG CAPS capsule Take 1 capsule (0.4 mg total) by mouth daily after supper. 02/06/18   Pincus Sanes, MD  warfarin (COUMADIN) 10 MG tablet Take 1 tablet daily or as directed by Coumadin clinic 03/25/18   Marinus Maw, MD    Family History Family History  Problem Relation Age  of Onset  . Diabetes Mother   . Diabetes Father   . Stroke Father 58  . Diabetes Sister   . Clotting disorder Sister 75  . Diabetes Other        nephew  . Diabetes Brother   . Diabetes Maternal Grandmother     Social History Social History   Tobacco Use  . Smoking status: Former Smoker    Packs/day: 2.00    Years: 5.00    Pack years: 10.00    Types: Cigarettes    Last attempt to quit: 12/04/2000    Years since quitting: 17.3  . Smokeless tobacco: Never Used  Substance Use Topics  . Alcohol use: Yes    Comment: weekends  . Drug use: Not on file     Allergies   Lipitor [atorvastatin]   Review of  Systems Review of Systems  All other systems reviewed and are negative.    Physical Exam Updated Vital Signs BP (!) 159/100   Pulse (!) 102   Temp 98 F (36.7 C)   Resp 18   SpO2 99%   Physical Exam  Constitutional: He appears well-developed and well-nourished. No distress.  Patient appears uncomfortable but nontoxic.  Able to ambulate  HENT:  Head: Atraumatic.  Eyes: Conjunctivae are normal.  Neck: Neck supple.  Cardiovascular: Normal rate and regular rhythm.  Pulmonary/Chest: Effort normal and breath sounds normal.  Abdominal: He exhibits distension. There is tenderness in the suprapubic area. No hernia. Hernia confirmed negative in the right inguinal area and confirmed negative in the left inguinal area.  Genitourinary: Testes normal and penis normal. Cremasteric reflex is present. Right testis shows no tenderness. Left testis shows no tenderness.  Genitourinary Comments: Chaperone present during exam  Neurological: He is alert.  Skin: No rash noted.  Psychiatric: He has a normal mood and affect.  Nursing note and vitals reviewed.    ED Treatments / Results  Labs (all labs ordered are listed, but only abnormal results are displayed) Labs Reviewed  COMPREHENSIVE METABOLIC PANEL - Abnormal; Notable for the following components:      Result Value   Glucose, Bld 206 (*)    Total Protein 8.3 (*)    ALT 16 (*)    Total Bilirubin 1.3 (*)    All other components within normal limits  CBC - Abnormal; Notable for the following components:   WBC 10.7 (*)    RBC 6.05 (*)    MCV 77.4 (*)    All other components within normal limits  URINALYSIS, ROUTINE W REFLEX MICROSCOPIC - Abnormal; Notable for the following components:   Glucose, UA >=500 (*)    Hgb urine dipstick LARGE (*)    Ketones, ur 5 (*)    RBC / HPF >50 (*)    All other components within normal limits  LIPASE, BLOOD    EKG None  Radiology No results found.  Procedures Procedures (including critical  care time)  Medications Ordered in ED Medications - No data to display   Initial Impression / Assessment and Plan / ED Course  I have reviewed the triage vital signs and the nursing notes.  Pertinent labs & imaging results that were available during my care of the patient were reviewed by me and considered in my medical decision making (see chart for details).     BP (!) 159/100   Pulse (!) 102   Temp 98 F (36.7 C)   Resp 18   SpO2 99%    Final  Clinical Impressions(s) / ED Diagnoses   Final diagnoses:  Acute urinary retention    ED Discharge Orders    None     1:54 PM Patient with history of BPH here with acute onset of urinary retention likely related to his baseline BPH.  No medication change since therefore I have low suspicion for anticholinergic manifestation.  No back pain, or red flags.  No report of constipation.  He does have a distended abdomen and tenderness to suprapubic region.  Bladder scan revealed greater than 999 mL of retained urine.  Inspection of his penis without any concerning feature.  Will place Foley to provide relieved.  Anticipate patient following up with [facility] for further care.  3:11 PM After insertion of Foley catheter, significant amount of urine was obtained, urine showing large amount of hemoglobin and urine dipsticks but no signs of infection.  Renal function is normal.  Mildly elevated CBG of 206 without change in anion gap.  Patient felt much better after Foley placement.  At this time is stable to go home with a leg bag.  He will follow-up closely with [facility] for further management of his condition.   Fayrene Helper, PA-C 03/31/18 1516    Mancel Bale, MD 04/01/18 1410

## 2018-03-31 NOTE — ED Notes (Signed)
Leg bag placed on pt , verbalized instructions on care

## 2018-03-31 NOTE — ED Triage Notes (Signed)
Pt states 1 day of pain with urination, nausea and vomiting. PT states hx of urinary retention with foley placement. Pain to lower abdomen.

## 2018-04-11 ENCOUNTER — Ambulatory Visit (INDEPENDENT_AMBULATORY_CARE_PROVIDER_SITE_OTHER): Payer: Managed Care, Other (non HMO) | Admitting: *Deleted

## 2018-04-11 DIAGNOSIS — Z8673 Personal history of transient ischemic attack (TIA), and cerebral infarction without residual deficits: Secondary | ICD-10-CM | POA: Diagnosis not present

## 2018-04-11 DIAGNOSIS — G459 Transient cerebral ischemic attack, unspecified: Secondary | ICD-10-CM | POA: Diagnosis not present

## 2018-04-11 DIAGNOSIS — Z5181 Encounter for therapeutic drug level monitoring: Secondary | ICD-10-CM | POA: Diagnosis not present

## 2018-04-11 LAB — POCT INR: INR: 2

## 2018-04-11 NOTE — Patient Instructions (Signed)
Description   Continue taking 10mg  each day - your refill will be for the 10mg  tablet will you run out, so for now keep taking two of 5mg  tablets.  Recheck INR in 4  weeks.   Call the Coumadin clinic with any questions #[phone].

## 2018-04-12 ENCOUNTER — Telehealth: Payer: Self-pay | Admitting: Pharmacist

## 2018-04-12 NOTE — Telephone Encounter (Signed)
Pt's daughter called clinic to report that pt started taking Keflex. Advised them that this will not interfere with his Coumadin and to continue his normal 10mg  daily dose and keep f/u in 4 weeks.

## 2018-04-25 ENCOUNTER — Telehealth: Payer: Self-pay | Admitting: Internal Medicine

## 2018-04-25 NOTE — Telephone Encounter (Signed)
New Message:          [facility] Pre-operative Risk Assessment    Request for surgical clearance:  1. What type of surgery is being performed?  Green light laser of the prostate  2. When is this surgery scheduled? TBD  3. What type of clearance is required (medical clearance vs. Pharmacy clearance to hold med vs. Both)? Pharmacy  4. Are there any medications that need to be held prior to surgery and how long?warfarin   5. Practice name and name of physician performing surgery? Dr. Gloriann Loan [facility]  6. What is your office phone number [phone] ext  [phone]   7.   What is your office fax number [phone]  8.   Anesthesia type (None, local, MAC, general) ? General   Karl [name] 04/25/2018, 9:38 AM  _________________________________________________________________   (provider comments below)

## 2018-04-26 NOTE — Telephone Encounter (Signed)
Patient with diagnosis of TIA on warfarin for anticoagulation.    Procedure: Green light laser of prostate  Date of procedure: TBD  CrCl 94.2 Platelet count 329  Because of stroke/TIA patient will need to be bridged with enoxaparin when stopping warfarin.  However it has been < 6 months since the TIA.  Will forward to Dr. Ladona Ridgel for final approval to proceed.

## 2018-04-27 NOTE — Telephone Encounter (Signed)
He may proceed with urologic procedure if clinically indicated. He will need to be bridged. He is a moderate surgical risk for cardiac complication. GT

## 2018-04-29 NOTE — Telephone Encounter (Signed)
To preop pool 

## 2018-04-30 NOTE — Telephone Encounter (Signed)
Dr. Ladona Ridgel has cleared pt for procedure.  Per Dr. Ladona Ridgel, "He may proceed with urologic procedure if clinically indicated. He will need to be bridged. He is a moderate surgical risk for cardiac complication". GT  Our coumadin clinic follows him and can arrange Lovenox Bridge. I will dax clearance to requesting provider and will remove from preop pool.

## 2018-05-05 ENCOUNTER — Other Ambulatory Visit: Payer: Self-pay | Admitting: Internal Medicine

## 2018-05-09 ENCOUNTER — Ambulatory Visit (INDEPENDENT_AMBULATORY_CARE_PROVIDER_SITE_OTHER): Payer: Managed Care, Other (non HMO) | Admitting: *Deleted

## 2018-05-09 DIAGNOSIS — Z5181 Encounter for therapeutic drug level monitoring: Secondary | ICD-10-CM | POA: Diagnosis not present

## 2018-05-09 DIAGNOSIS — G459 Transient cerebral ischemic attack, unspecified: Secondary | ICD-10-CM

## 2018-05-09 DIAGNOSIS — Z8673 Personal history of transient ischemic attack (TIA), and cerebral infarction without residual deficits: Secondary | ICD-10-CM | POA: Diagnosis not present

## 2018-05-09 LAB — POCT INR: INR: 2.3 (ref 2.0–3.0)

## 2018-05-09 NOTE — Patient Instructions (Signed)
Description   Continue taking 10mg  each day.  Recheck INR in 4  weeks.   Call the Coumadin clinic with any questions #[phone].

## 2018-05-10 ENCOUNTER — Other Ambulatory Visit: Payer: Self-pay | Admitting: Urology

## 2018-05-27 NOTE — Progress Notes (Addendum)
Clearance 04-27-18 epic Dr. Ladona Ridgel  ekg 12-25-17 epic  echo 12-20-17 epic  02-05-18 lov Dr. Ladona Ridgel epic

## 2018-05-27 NOTE — Patient Instructions (Addendum)
MOUHAMADOU PROFFER  05/27/2018   Your procedure is scheduled on: 06-05-18  Report to [facility] Main  Entrance  Report to admitting at      0630 AM    Call this number if you have problems the morning of surgery [phone]    Remember: Do not eat food or drink liquids :After Midnight.     Take these medicines the morning of surgery with A SIP OF WATER: finesteride, carvedilol DO NOT TAKE ANY DIABETIC MEDICATIONS DAY OF YOUR SURGERY                               You may not have any metal on your body including hair pins and              piercings  Do not wear jewelry,lotions, powders or perfumes, deodorant                       Men may shave face and neck.   Do not bring valuables to the hospital. [facility] IS NOT             RESPONSIBLE   FOR VALUABLES.  Contacts, dentures or bridgework may not be worn into surgery.  Leave suitcase in the car. After surgery it may be brought to your room.                Please read over the following fact sheets you were given: _____________________________________________________________________           [facility] - Preparing for Surgery Before surgery, you can play an important role.  Because skin is not sterile, your skin needs to be as free of germs as possible.  You can reduce the number of germs on your skin by washing with CHG (chlorahexidine gluconate) soap before surgery.  CHG is an antiseptic cleaner which kills germs and bonds with the skin to continue killing germs even after washing. Please DO NOT use if you have an allergy to CHG or antibacterial soaps.  If your skin becomes reddened/irritated stop using the CHG and inform your nurse when you arrive at Short Stay. Do not shave (including legs and underarms) for at least 48 hours prior to the first CHG shower.  You may shave your face/neck. Please follow these instructions carefully:  1.  Shower with CHG Soap the night before surgery and the  morning of  Surgery.  2.  If you choose to wash your hair, wash your hair first as usual with your  normal  shampoo.  3.  After you shampoo, rinse your hair and body thoroughly to remove the  shampoo.                           4.  Use CHG as you would any other liquid soap.  You can apply chg directly  to the skin and wash                       Gently with a scrungie or clean washcloth.  5.  Apply the CHG Soap to your body ONLY FROM THE NECK DOWN.   Do not use on face/ open  Wound or open sores. Avoid contact with eyes, ears mouth and genitals (private parts).                       Wash face,  Genitals (private parts) with your normal soap.             6.  Wash thoroughly, paying special attention to the area where your surgery  will be performed.  7.  Thoroughly rinse your body with warm water from the neck down.  8.  DO NOT shower/wash with your normal soap after using and rinsing off  the CHG Soap.                9.  Pat yourself dry with a clean towel.            10.  Wear clean pajamas.            11.  Place clean sheets on your bed the night of your first shower and do not  sleep with pets. Day of Surgery : Do not apply any lotions/deodorants the morning of surgery.  Please wear clean clothes to the hospital/surgery center.  FAILURE TO FOLLOW THESE INSTRUCTIONS MAY RESULT IN THE CANCELLATION OF YOUR SURGERY PATIENT SIGNATURE_________________________________  NURSE SIGNATURE__________________________________  ________________________________________________________________________  WHAT IS A BLOOD TRANSFUSION? Blood Transfusion Information  A transfusion is the replacement of blood or some of its parts. Blood is made up of multiple cells which provide different functions.  Red blood cells carry oxygen and are used for blood loss replacement.  White blood cells fight against infection.  Platelets control bleeding.  Plasma helps clot blood.  Other blood products are  available for specialized needs, such as hemophilia or other clotting disorders. BEFORE THE TRANSFUSION  Who gives blood for transfusions?   Healthy volunteers who are fully evaluated to make sure their blood is safe. This is blood bank blood. Transfusion therapy is the safest it has ever been in the practice of medicine. Before blood is taken from a donor, a complete history is taken to make sure that person has no history of diseases nor engages in risky social behavior (examples are intravenous drug use or sexual activity with multiple partners). The donor's travel history is screened to minimize risk of transmitting infections, such as malaria. The donated blood is tested for signs of infectious diseases, such as HIV and hepatitis. The blood is then tested to be sure it is compatible with you in order to minimize the chance of a transfusion reaction. If you or a relative donates blood, this is often done in anticipation of surgery and is not appropriate for emergency situations. It takes many days to process the donated blood. RISKS AND COMPLICATIONS Although transfusion therapy is very safe and saves many lives, the main dangers of transfusion include:   Getting an infectious disease.  Developing a transfusion reaction. This is an allergic reaction to something in the blood you were given. Every precaution is taken to prevent this. The decision to have a blood transfusion has been considered carefully by your caregiver before blood is given. Blood is not given unless the benefits outweigh the risks. AFTER THE TRANSFUSION  Right after receiving a blood transfusion, you will usually feel much better and more energetic. This is especially true if your red blood cells have gotten low (anemic). The transfusion raises the level of the red blood cells which carry oxygen, and this usually causes an energy increase.  The  nurse administering the transfusion will monitor you carefully for  complications. HOME CARE INSTRUCTIONS  No special instructions are needed after a transfusion. You may find your energy is better. Speak with your caregiver about any limitations on activity for underlying diseases you may have. SEEK MEDICAL CARE IF:   Your condition is not improving after your transfusion.  You develop redness or irritation at the intravenous (IV) site. SEEK IMMEDIATE MEDICAL CARE IF:  Any of the following symptoms occur over the next 12 hours:  Shaking chills.  You have a temperature by mouth above 102 F (38.9 C), not controlled by medicine.  Chest, back, or muscle pain.  People around you feel you are not acting correctly or are confused.  Shortness of breath or difficulty breathing.  Dizziness and fainting.  You get a rash or develop hives.  You have a decrease in urine output.  Your urine turns a dark color or changes to pink, red, or brown. Any of the following symptoms occur over the next 10 days:  You have a temperature by mouth above 102 F (38.9 C), not controlled by medicine.  Shortness of breath.  Weakness after normal activity.  The white part of the eye turns yellow (jaundice).  You have a decrease in the amount of urine or are urinating less often.  Your urine turns a dark color or changes to pink, red, or brown. Document Released: 11/17/2000 Document Revised: 02/12/2012 Document Reviewed: 07/06/2008 [facility] Patient Information 2014 [place], [place].  _______________________________________________________________________

## 2018-05-28 ENCOUNTER — Other Ambulatory Visit: Payer: Self-pay

## 2018-05-28 ENCOUNTER — Encounter (HOSPITAL_COMMUNITY)
Admission: RE | Admit: 2018-05-28 | Discharge: 2018-05-28 | Disposition: A | Discharge status: Home / Self Care | Payer: Managed Care, Other (non HMO) | Source: Ambulatory Visit | Attending: Urology | Admitting: Urology

## 2018-05-28 ENCOUNTER — Encounter (HOSPITAL_COMMUNITY): Payer: Self-pay

## 2018-05-28 DIAGNOSIS — Z01812 Encounter for preprocedural laboratory examination: Secondary | ICD-10-CM | POA: Diagnosis not present

## 2018-05-28 HISTORY — DX: Dyspnea, unspecified: R06.00

## 2018-05-28 LAB — HEMOGLOBIN A1C
Hgb A1c MFr Bld: 6.9 % — ABNORMAL HIGH (ref 4.8–5.6)
Mean Plasma Glucose: 151.33 mg/dL

## 2018-05-28 LAB — CBC
HEMATOCRIT: 41.8 % (ref 39.0–52.0)
HEMOGLOBIN: 13.4 g/dL (ref 13.0–17.0)
MCH: 25.1 pg — AB (ref 26.0–34.0)
MCHC: 32.1 g/dL (ref 30.0–36.0)
MCV: 78.4 fL (ref 78.0–100.0)
Platelets: 271 10*3/uL (ref 150–400)
RBC: 5.33 MIL/uL (ref 4.22–5.81)
RDW: 14.7 % (ref 11.5–15.5)
WBC: 5 10*3/uL (ref 4.0–10.5)

## 2018-05-28 LAB — BASIC METABOLIC PANEL WITH GFR
Anion gap: 8 (ref 5–15)
BUN: 13 mg/dL (ref 8–23)
CO2: 24 mmol/L (ref 22–32)
Calcium: 9.1 mg/dL (ref 8.9–10.3)
Chloride: 106 mmol/L (ref 98–111)
Creatinine, Ser: 1.07 mg/dL (ref 0.61–1.24)
GFR calc Af Amer: >60 mL/min
GFR calc non Af Amer: >60 mL/min
Glucose, Bld: 106 mg/dL — ABNORMAL HIGH (ref 70–99)
Potassium: 3.9 mmol/L (ref 3.5–5.1)
Sodium: 138 mmol/L (ref 135–145)

## 2018-05-28 LAB — ABO/RH: ABO/RH(D): B POS

## 2018-05-28 LAB — GLUCOSE, CAPILLARY: Glucose-Capillary: 105 mg/dL — ABNORMAL HIGH (ref 70–99)

## 2018-05-28 NOTE — Progress Notes (Signed)
Chart left for Verna for follow up 

## 2018-05-29 ENCOUNTER — Ambulatory Visit (INDEPENDENT_AMBULATORY_CARE_PROVIDER_SITE_OTHER): Payer: Managed Care, Other (non HMO) | Admitting: Pharmacist

## 2018-05-29 DIAGNOSIS — Z5181 Encounter for therapeutic drug level monitoring: Secondary | ICD-10-CM | POA: Diagnosis not present

## 2018-05-29 DIAGNOSIS — Z8673 Personal history of transient ischemic attack (TIA), and cerebral infarction without residual deficits: Secondary | ICD-10-CM | POA: Diagnosis not present

## 2018-05-29 DIAGNOSIS — G459 Transient cerebral ischemic attack, unspecified: Secondary | ICD-10-CM

## 2018-05-29 LAB — POCT INR: INR: 3.7 — AB (ref 2.0–3.0)

## 2018-05-29 MED ORDER — ENOXAPARIN SODIUM 100 MG/ML ~~LOC~~ SOLN: 100.0000 mg | Freq: Two times a day (BID) | SUBCUTANEOUS | 1 refills | Status: DC

## 2018-05-29 NOTE — Patient Instructions (Signed)
Description   DO not take any more coumadin prior to your procedure. THEN follow instructions below around procedure.  Recheck INR after procedure.   Call the Coumadin clinic with any questions #[phone].    05/29/18: Last dose of Coumadin.  05/30/18: No Coumadin or Lovenox.  05/31/18: No Coumadin or Lovenox.  06/01/18: Inject Lovenox 100mg  in the fatty abdominal tissue at least 2 inches from the belly button twice a day about 12 hours apart, 5am and 5pm rotate sites. No Coumadin.  06/02/18: Inject Lovenox in the fatty tissue every 12 hours, 5am and 5pm. No Coumadin.  06/03/18: Inject Lovenox in the fatty tissue every 12 hours, 5am and 5pm. No Coumadin.  06/04/18: Inject Lovenox in the fatty tissue in the morning at 5 am (No PM dose). No Coumadin.  06/05/18: Procedure Day - No Lovenox - Resume Coumadin in the evening or as directed by doctor   06/06/18: Resume Lovenox inject in the fatty tissue every 12 hours and take Coumadin.  06/07/18: Inject Lovenox in the fatty tissue every 12 hours and take Coumadin.  06/08/18: Inject Lovenox in the fatty tissue every 12 hours and take Coumadin.  06/09/18: Inject Lovenox in the fatty tissue every 12 hours and take Coumadin.  06/10/18: Inject Lovenox in the fatty tissue every 12 hours and take Coumadin.  06/11/18: Coumadin appt to check INR.

## 2018-06-01 ENCOUNTER — Other Ambulatory Visit: Payer: Self-pay | Admitting: Internal Medicine

## 2018-06-04 NOTE — Anesthesia Preprocedure Evaluation (Addendum)
Anesthesia Evaluation  Patient identified by MRN, date of birth, ID band Patient awake    Reviewed: Allergy & Precautions, NPO status , Patient's Chart, lab work & pertinent test results  Airway Mallampati: II  TM Distance: >3 FB Neck ROM: Full    Dental no notable dental hx. (+) Dental Advisory Given, Poor Dentition, Missing, Loose   Pulmonary neg pulmonary ROS, former smoker,    Pulmonary exam normal breath sounds clear to auscultation       Cardiovascular hypertension, Pt. on home beta blockers and Pt. on medications + CAD  Normal cardiovascular exam(-) dysrhythmias  Rhythm:Regular Rate:Normal  Echo 12/20/2017 Left ventricle:  The cavity size was severely dilated. There was moderate concentric hypertrophy. Systolic function was severely reduced. The estimated ejection fraction was in the range of 20% to 25%. Diffuse hypokinesis. Doppler parameters are consistent with abnormal left ventricular relaxation    Neuro/Psych TIAnegative psych ROS   GI/Hepatic negative GI ROS,   Endo/Other  diabetes, Type 1  Renal/GU Renal disease     Musculoskeletal   Abdominal   Peds  Hematology   Anesthesia Other Findings   Reproductive/Obstetrics                            Lab Results  Component Value Date   WBC 5.0 05/28/2018   HGB 13.4 05/28/2018   HCT 41.8 05/28/2018   MCV 78.4 05/28/2018   PLT 271 05/28/2018    Anesthesia Physical Anesthesia Plan  ASA: IV  Anesthesia Plan: General   Post-op Pain Management:    Induction: Intravenous  PONV Risk Score and Plan: 2 and Treatment may vary due to age or medical condition  Airway Management Planned: Oral ETT  Additional Equipment: Arterial line  Intra-op Plan:   Post-operative Plan: Extubation in OR  Informed Consent: I have reviewed the patients History and Physical, chart, labs and discussed the procedure including the risks, benefits  and alternatives for the proposed anesthesia with the patient or authorized representative who has indicated his/her understanding and acceptance.   Dental advisory given  Plan Discussed with: CRNA  Anesthesia Plan Comments:        Anesthesia Quick Evaluation

## 2018-06-05 ENCOUNTER — Encounter (HOSPITAL_COMMUNITY): Payer: Self-pay | Admitting: Certified Registered Nurse Anesthetist

## 2018-06-05 ENCOUNTER — Encounter (HOSPITAL_COMMUNITY)
Admission: RE | Disposition: A | Discharge status: Home / Self Care | Payer: Self-pay | Source: Ambulatory Visit | Attending: Urology

## 2018-06-05 ENCOUNTER — Ambulatory Visit (HOSPITAL_COMMUNITY): Payer: Managed Care, Other (non HMO) | Admitting: Anesthesiology

## 2018-06-05 ENCOUNTER — Observation Stay (HOSPITAL_COMMUNITY)
Admission: RE | Admit: 2018-06-05 | Discharge: 2018-06-06 | Disposition: A | Discharge status: Home / Self Care | Payer: Managed Care, Other (non HMO) | Source: Ambulatory Visit | Attending: Urology | Admitting: Urology

## 2018-06-05 ENCOUNTER — Other Ambulatory Visit: Payer: Self-pay

## 2018-06-05 DIAGNOSIS — E785 Hyperlipidemia, unspecified: Secondary | ICD-10-CM | POA: Insufficient documentation

## 2018-06-05 DIAGNOSIS — N401 Enlarged prostate with lower urinary tract symptoms: Secondary | ICD-10-CM | POA: Diagnosis present

## 2018-06-05 DIAGNOSIS — R338 Other retention of urine: Secondary | ICD-10-CM | POA: Diagnosis not present

## 2018-06-05 DIAGNOSIS — N138 Other obstructive and reflux uropathy: Secondary | ICD-10-CM | POA: Diagnosis not present

## 2018-06-05 DIAGNOSIS — I2510 Atherosclerotic heart disease of native coronary artery without angina pectoris: Secondary | ICD-10-CM | POA: Insufficient documentation

## 2018-06-05 DIAGNOSIS — I255 Ischemic cardiomyopathy: Secondary | ICD-10-CM | POA: Insufficient documentation

## 2018-06-05 DIAGNOSIS — I5022 Chronic systolic (congestive) heart failure: Secondary | ICD-10-CM | POA: Insufficient documentation

## 2018-06-05 DIAGNOSIS — Z87891 Personal history of nicotine dependence: Secondary | ICD-10-CM | POA: Diagnosis not present

## 2018-06-05 DIAGNOSIS — I110 Hypertensive heart disease with heart failure: Secondary | ICD-10-CM | POA: Insufficient documentation

## 2018-06-05 DIAGNOSIS — E1151 Type 2 diabetes mellitus with diabetic peripheral angiopathy without gangrene: Secondary | ICD-10-CM | POA: Insufficient documentation

## 2018-06-05 DIAGNOSIS — Z8673 Personal history of transient ischemic attack (TIA), and cerebral infarction without residual deficits: Secondary | ICD-10-CM | POA: Diagnosis not present

## 2018-06-05 DIAGNOSIS — Z7984 Long term (current) use of oral hypoglycemic drugs: Secondary | ICD-10-CM | POA: Diagnosis not present

## 2018-06-05 DIAGNOSIS — Z79899 Other long term (current) drug therapy: Secondary | ICD-10-CM | POA: Diagnosis not present

## 2018-06-05 DIAGNOSIS — Z7901 Long term (current) use of anticoagulants: Secondary | ICD-10-CM | POA: Insufficient documentation

## 2018-06-05 DIAGNOSIS — Z888 Allergy status to other drugs, medicaments and biological substances status: Secondary | ICD-10-CM | POA: Diagnosis not present

## 2018-06-05 DIAGNOSIS — R339 Retention of urine, unspecified: Secondary | ICD-10-CM | POA: Diagnosis present

## 2018-06-05 DIAGNOSIS — I447 Left bundle-branch block, unspecified: Secondary | ICD-10-CM | POA: Insufficient documentation

## 2018-06-05 HISTORY — PX: GREEN LIGHT LASER TURP (TRANSURETHRAL RESECTION OF PROSTATE: SHX6260

## 2018-06-05 LAB — GLUCOSE, CAPILLARY
Glucose-Capillary: 148 mg/dL — ABNORMAL HIGH (ref 70–99)
Glucose-Capillary: 114 mg/dL — ABNORMAL HIGH (ref 70–99)
Glucose-Capillary: 146 mg/dL — ABNORMAL HIGH (ref 70–99)
Glucose-Capillary: 199 mg/dL — ABNORMAL HIGH (ref 70–99)

## 2018-06-05 LAB — HEMOGLOBIN AND HEMATOCRIT, BLOOD
HCT: 39.4 % (ref 39.0–52.0)
HEMOGLOBIN: 12.5 g/dL — AB (ref 13.0–17.0)

## 2018-06-05 LAB — TYPE AND SCREEN
ABO/RH(D): B POS
Antibody Screen: NEGATIVE

## 2018-06-05 LAB — PROTIME-INR
INR: 1.05
PROTHROMBIN TIME: 13.7 s (ref 11.4–15.2)

## 2018-06-05 SURGERY — GREEN LIGHT LASER TURP (TRANSURETHRAL RESECTION OF PROSTATE
Anesthesia: General

## 2018-06-05 MED ORDER — ONDANSETRON HCL 4 MG/2ML IJ SOLN: 4.0000 mg | Freq: Once | INTRAMUSCULAR | Status: DC | PRN

## 2018-06-05 MED ORDER — ACETAMINOPHEN 10 MG/ML IV SOLN: 1000.0000 mg | Freq: Once | INTRAVENOUS | Status: DC | PRN

## 2018-06-05 MED ORDER — CARVEDILOL 6.25 MG PO TABS
18.7500 mg | ORAL_TABLET | Freq: Once | ORAL | Status: AC
  Administered 2018-06-05: 18.75 mg via ORAL
  Filled 2018-06-05: qty 1

## 2018-06-05 MED ORDER — CARVEDILOL 12.5 MG PO TABS
12.5000 mg | ORAL_TABLET | Freq: Two times a day (BID) | ORAL | Status: DC
  Administered 2018-06-05 – 2018-06-06 (×2): 12.5 mg via ORAL
  Filled 2018-06-05 (×2): qty 1

## 2018-06-05 MED ORDER — HYDROMORPHONE HCL 1 MG/ML IJ SOLN
INTRAMUSCULAR | Status: AC
  Filled 2018-06-05: qty 1

## 2018-06-05 MED ORDER — LACTATED RINGERS IV SOLN
INTRAVENOUS | Status: DC
  Administered 2018-06-05 (×2): via INTRAVENOUS

## 2018-06-05 MED ORDER — ENALAPRIL MALEATE 5 MG PO TABS
5.0000 mg | ORAL_TABLET | Freq: Two times a day (BID) | ORAL | Status: DC
  Administered 2018-06-05 – 2018-06-06 (×2): 5 mg via ORAL
  Filled 2018-06-05 (×2): qty 1

## 2018-06-05 MED ORDER — MIDAZOLAM HCL 5 MG/5ML IJ SOLN
INTRAMUSCULAR | Status: DC | PRN
  Administered 2018-06-05 (×2): 1 mg via INTRAVENOUS

## 2018-06-05 MED ORDER — STERILE WATER FOR IRRIGATION IR SOLN
Status: DC | PRN
  Administered 2018-06-05: 12000 mL

## 2018-06-05 MED ORDER — PHENYLEPHRINE HCL 10 MG/ML IJ SOLN
INTRAVENOUS | Status: DC | PRN
  Administered 2018-06-05: 20 ug/min via INTRAVENOUS

## 2018-06-05 MED ORDER — SODIUM CHLORIDE 0.9 % IV SOLN
INTRAVENOUS | Status: DC
  Administered 2018-06-05: 14:00:00 via INTRAVENOUS

## 2018-06-05 MED ORDER — OXYCODONE HCL 5 MG PO TABS
5.0000 mg | ORAL_TABLET | ORAL | Status: DC | PRN
  Administered 2018-06-06: 5 mg via ORAL
  Filled 2018-06-05: qty 1

## 2018-06-05 MED ORDER — BELLADONNA ALKALOIDS-OPIUM 16.2-60 MG RE SUPP: 1.0000 | Freq: Four times a day (QID) | RECTAL | Status: DC | PRN

## 2018-06-05 MED ORDER — SENNOSIDES-DOCUSATE SODIUM 8.6-50 MG PO TABS: 2.0000 | ORAL_TABLET | Freq: Every day | ORAL | Status: DC

## 2018-06-05 MED ORDER — ROCURONIUM BROMIDE 100 MG/10ML IV SOLN
INTRAVENOUS | Status: DC | PRN
  Administered 2018-06-05: 50 mg via INTRAVENOUS
  Administered 2018-06-05: 20 mg via INTRAVENOUS

## 2018-06-05 MED ORDER — HYDROMORPHONE HCL 1 MG/ML IJ SOLN
0.2500 mg | INTRAMUSCULAR | Status: DC | PRN
  Administered 2018-06-05 (×2): 0.5 mg via INTRAVENOUS

## 2018-06-05 MED ORDER — ENOXAPARIN SODIUM 100 MG/ML ~~LOC~~ SOLN
100.0000 mg | Freq: Two times a day (BID) | SUBCUTANEOUS | Status: DC
  Administered 2018-06-06: 100 mg via SUBCUTANEOUS
  Filled 2018-06-05: qty 1

## 2018-06-05 MED ORDER — DIPHENHYDRAMINE HCL 12.5 MG/5ML PO ELIX: 12.5000 mg | ORAL_SOLUTION | Freq: Four times a day (QID) | ORAL | Status: DC | PRN

## 2018-06-05 MED ORDER — ETOMIDATE 2 MG/ML IV SOLN
INTRAVENOUS | Status: DC | PRN
  Administered 2018-06-05: 20 mg via INTRAVENOUS

## 2018-06-05 MED ORDER — LIDOCAINE HCL (CARDIAC) PF 100 MG/5ML IV SOSY
PREFILLED_SYRINGE | INTRAVENOUS | Status: DC | PRN
  Administered 2018-06-05: 50 mg via INTRAVENOUS

## 2018-06-05 MED ORDER — SODIUM CHLORIDE 0.9 % IR SOLN
3000.0000 mL | Status: DC
  Administered 2018-06-05: 3000 mL

## 2018-06-05 MED ORDER — MORPHINE SULFATE (PF) 2 MG/ML IV SOLN: 2.0000 mg | INTRAVENOUS | Status: DC | PRN

## 2018-06-05 MED ORDER — GLYCOPYRROLATE 0.2 MG/ML IJ SOLN
INTRAMUSCULAR | Status: DC | PRN
  Administered 2018-06-05: .1 mg via INTRAVENOUS

## 2018-06-05 MED ORDER — FENTANYL CITRATE (PF) 100 MCG/2ML IJ SOLN
INTRAMUSCULAR | Status: AC
Start: 2018-06-05 — End: ?
  Filled 2018-06-05: qty 2

## 2018-06-05 MED ORDER — INSULIN ASPART 100 UNIT/ML ~~LOC~~ SOLN
0.0000 [IU] | Freq: Three times a day (TID) | SUBCUTANEOUS | Status: DC
  Administered 2018-06-05 – 2018-06-06 (×2): 2 [IU] via SUBCUTANEOUS

## 2018-06-05 MED ORDER — ACETAMINOPHEN 325 MG PO TABS: 650.0000 mg | ORAL_TABLET | ORAL | Status: DC | PRN

## 2018-06-05 MED ORDER — PROPOFOL 10 MG/ML IV BOLUS
INTRAVENOUS | Status: AC
  Filled 2018-06-05: qty 20

## 2018-06-05 MED ORDER — SUGAMMADEX SODIUM 500 MG/5ML IV SOLN
INTRAVENOUS | Status: DC | PRN
  Administered 2018-06-05 (×2): 100 mg via INTRAVENOUS

## 2018-06-05 MED ORDER — BACITRACIN-NEOMYCIN-POLYMYXIN 400-5-5000 EX OINT: 1.0000 "application " | TOPICAL_OINTMENT | Freq: Three times a day (TID) | CUTANEOUS | Status: DC | PRN

## 2018-06-05 MED ORDER — OXYBUTYNIN CHLORIDE 5 MG PO TABS
5.0000 mg | ORAL_TABLET | Freq: Three times a day (TID) | ORAL | Status: DC | PRN
  Administered 2018-06-05: 5 mg via ORAL
  Filled 2018-06-05: qty 1

## 2018-06-05 MED ORDER — HYDROCODONE-ACETAMINOPHEN 7.5-325 MG PO TABS: 1.0000 | ORAL_TABLET | Freq: Once | ORAL | Status: DC | PRN

## 2018-06-05 MED ORDER — FENTANYL CITRATE (PF) 100 MCG/2ML IJ SOLN
INTRAMUSCULAR | Status: AC
  Filled 2018-06-05: qty 2

## 2018-06-05 MED ORDER — ONDANSETRON HCL 4 MG/2ML IJ SOLN
INTRAMUSCULAR | Status: DC | PRN
  Administered 2018-06-05: 4 mg via INTRAVENOUS

## 2018-06-05 MED ORDER — HYDROCODONE-ACETAMINOPHEN 5-325 MG PO TABS: 1.0000 | ORAL_TABLET | ORAL | 0 refills | Status: DC | PRN

## 2018-06-05 MED ORDER — FINASTERIDE 5 MG PO TABS
5.0000 mg | ORAL_TABLET | Freq: Every day | ORAL | Status: DC
Start: 2018-06-05 — End: 2018-06-06
  Administered 2018-06-05 – 2018-06-06 (×2): 5 mg via ORAL
  Filled 2018-06-05 (×2): qty 1

## 2018-06-05 MED ORDER — CEFAZOLIN SODIUM-DEXTROSE 2-4 GM/100ML-% IV SOLN
2.0000 g | Freq: Once | INTRAVENOUS | Status: AC
  Administered 2018-06-05: 2 g via INTRAVENOUS
  Filled 2018-06-05: qty 100

## 2018-06-05 MED ORDER — MEPERIDINE HCL 50 MG/ML IJ SOLN: 6.2500 mg | INTRAMUSCULAR | Status: DC | PRN

## 2018-06-05 MED ORDER — FENTANYL CITRATE (PF) 100 MCG/2ML IJ SOLN
INTRAMUSCULAR | Status: DC | PRN
  Administered 2018-06-05: 100 ug via INTRAVENOUS
  Administered 2018-06-05 (×2): 25 ug via INTRAVENOUS
  Administered 2018-06-05: 50 ug via INTRAVENOUS

## 2018-06-05 MED ORDER — SODIUM CHLORIDE 0.9 % IR SOLN
Status: DC | PRN
  Administered 2018-06-05: 19000 mL

## 2018-06-05 MED ORDER — ACETAMINOPHEN 10 MG/ML IV SOLN
INTRAVENOUS | Status: AC
  Filled 2018-06-05: qty 100

## 2018-06-05 MED ORDER — DOCUSATE SODIUM 100 MG PO CAPS
100.0000 mg | ORAL_CAPSULE | Freq: Two times a day (BID) | ORAL | Status: DC
  Administered 2018-06-05 – 2018-06-06 (×2): 100 mg via ORAL
  Filled 2018-06-05 (×2): qty 1

## 2018-06-05 MED ORDER — MIDAZOLAM HCL 2 MG/2ML IJ SOLN
INTRAMUSCULAR | Status: AC
  Filled 2018-06-05: qty 2

## 2018-06-05 MED ORDER — DIPHENHYDRAMINE HCL 50 MG/ML IJ SOLN: 12.5000 mg | Freq: Four times a day (QID) | INTRAMUSCULAR | Status: DC | PRN

## 2018-06-05 MED ORDER — ACETAMINOPHEN 10 MG/ML IV SOLN
INTRAVENOUS | Status: DC | PRN
  Administered 2018-06-05: 1000 mg via INTRAVENOUS

## 2018-06-05 MED ORDER — ONDANSETRON HCL 4 MG/2ML IJ SOLN: 4.0000 mg | INTRAMUSCULAR | Status: DC | PRN

## 2018-06-05 SURGICAL SUPPLY — 18 items
BAG URINE DRAINAGE (UROLOGICAL SUPPLIES) ×3 IMPLANT
BAG URO CATCHER STRL LF (MISCELLANEOUS) ×3 IMPLANT
CATH HEMA 3WAY 30CC 24FR COUDE (CATHETERS) ×3 IMPLANT
CATH TIEMANN FOLEY 18FR 5CC (CATHETERS) IMPLANT
COVER FOOTSWITCH UNIV (MISCELLANEOUS) ×3 IMPLANT
COVER SURGICAL LIGHT HANDLE (MISCELLANEOUS) ×3 IMPLANT
GLOVE BIOGEL M STRL SZ7.5 (GLOVE) ×3 IMPLANT
GOWN STRL REUS W/TWL XL LVL3 (GOWN DISPOSABLE) ×3 IMPLANT
HOLDER FOLEY CATH W/STRAP (MISCELLANEOUS) IMPLANT
LASER FIBER /GREENLIGHT LASER (Laser) ×3 IMPLANT
LASER GREENLIGHT RENTAL P/PROC (Laser) ×3 IMPLANT
LOOP CUT BIPOLAR 24F LRG (ELECTROSURGICAL) ×3 IMPLANT
MANIFOLD NEPTUNE II (INSTRUMENTS) ×3 IMPLANT
PACK CYSTO (CUSTOM PROCEDURE TRAY) ×3 IMPLANT
SYR 30ML LL (SYRINGE) ×3 IMPLANT
SYRINGE IRR TOOMEY STRL 70CC (SYRINGE) IMPLANT
TUBING CONNECTING 10 (TUBING) ×2 IMPLANT
TUBING CONNECTING 10' (TUBING) ×1

## 2018-06-05 NOTE — H&P (Signed)
CC/HPI: CC: BPH with lower urinary tract symptoms, urinary retention  HPI: A 62 year old male with a history of BPH with lower urinary tract symptoms. His PSA on finasteride back in November was less than 4. He has been taking Flomax and finasteride since urinary retention. He has failed 2 voiding trials. He recently underwent a urodynamics that revealed decreased bladder sensation with a first sensation to void at 5 22 cc. He had a strong desire at 5 29 cc. He generated a good strong contraction when voiding but had a postvoid residual of 300 cc. His urinalysis was consistent with bladder outlet obstruction.     ALLERGIES: Lipitor - Diarrhea, Vomiting    MEDICATIONS: Finasteride 5 mg tablet 1 tablet PO Daily  Tamsulosin Hcl 1 capsule PO Daily  Warfarin Sodium 10 mg tablet 1 tablet PO As Directed  Carvedilol 12.5 mg tablet 1 1/2 tablet PO BID  Enalapril Maleate 5 mg tablet 1 tablet PO BID  Farxiga 5 mg tablet 1 tablet PO Daily  Metformin Hcl 1,000 mg tablet 1 tablet PO BID  Pravastatin Sodium 80 mg tablet 1 tablet PO Q PM     GU PSH: Complex cystometrogram, w/ void pressure and urethral pressure profile studies, any technique - 04/18/2018 Complex Uroflow - 04/18/2018 Emg surf Electrd - 04/18/2018 Intrabd voidng Press - 04/18/2018    NON-GU PSH: Cardiac Stent Placement - 2002, 1998    GU PMH: Urinary Retention, drug induced (Worsening, Chronic), Add Finasteride 5 mg 1 po daily. Instructed to RTC in 6 hrs if unable to void. F/U 1 day for OV/PVR. If he fails TOV will need UDS and cysto w/MD - 04/08/2018 Encounter for Prostate Cancer screening - 10/22/2017 Nocturia - 10/22/2017 BPH w/LUTS (Improving) - 10/31/2016, - 09/20/2016 Urinary Retention - 09/20/2016 Disorder Kidney/ureter, Unspec      PMH Notes: .   NON-GU PMH: Cardiomyopathy, unspecified Congestive heart failure Coronary Artery Disease Diabetes Type 2 Hypertension Other specified heart block Stroke/TIA    FAMILY HISTORY:  Congenital Clotting Factor Deficiency - Sister Death - Mother, Father Diabetes - Runs in Family stroke - Father   SOCIAL HISTORY: Marital Status: Single Preferred Language: English; Ethnicity: Not Hispanic Or Latino; Race: Black or African American Current Smoking Status: Patient does not smoke anymore. Smoked for 10 years.  Does not use smokeless tobacco. Has not drank since 03/25/2018.  Does not use drugs. Drinks 2 caffeinated drinks per day. Has not had a blood transfusion. Patient's occupation Geophysicist/field seismologist.    REVIEW OF SYSTEMS:    GU Review Male:   Patient denies frequent urination, hard to postpone urination, burning/ pain with urination, get up at night to urinate, leakage of urine, stream starts and stops, trouble starting your stream, have to strain to urinate , erection problems, and penile pain.  Gastrointestinal (Upper):   Patient denies nausea, vomiting, and indigestion/ heartburn.  Gastrointestinal (Lower):   Patient denies diarrhea and constipation.  Constitutional:   Patient denies fever, night sweats, weight loss, and fatigue.  Skin:   Patient denies skin rash/ lesion and itching.  Eyes:   Patient denies double vision and blurred vision.  Ears/ Nose/ Throat:   Patient denies sore throat and sinus problems.  Hematologic/Lymphatic:   Patient denies swollen glands and easy bruising.  Cardiovascular:   Patient denies leg swelling and chest pains.  Respiratory:   Patient denies cough and shortness of breath.  Endocrine:   Patient denies excessive thirst.  Musculoskeletal:   Patient denies back pain and  joint pain.  Neurological:   Patient denies headaches and dizziness.  Psychologic:   Patient denies depression and anxiety.   VITAL SIGNS:      04/23/2018 10:55 AM  BP 112/70 mmHg  Heart Rate 62 /min  Temperature 62.0 F / 16.6 C   GU PHYSICAL EXAMINATION:    Anus and Perineum: No hemorrhoids. No anal stenosis. No rectal fissure, no anal fissure. No  edema, no dimple, no perineal tenderness, no anal tenderness.  Scrotum: No lesions. No edema. No cysts. No warts.  Epididymides: Right: no spermatocele, no masses, no cysts, no tenderness, no induration, no enlargement. Left: no spermatocele, no masses, no cysts, no tenderness, no induration, no enlargement.  Testes: No tenderness, no swelling, no enlargement left testes. No tenderness, no swelling, no enlargement right testes. Normal location left testes. Normal location right testes. No mass, no cyst, no varicocele, no hydrocele left testes. No mass, no cyst, no varicocele, no hydrocele right testes.  Urethral Meatus: Normal size. No lesion, no wart, no discharge, no polyp. Normal location.  Penis: Circumcised, no warts, no cracks. No dorsal Peyronie's plaques, no left corporal Peyronie's plaques, no right corporal Peyronie's plaques, no scarring, no warts. No balanitis, no meatal stenosis.  Prostate: 40 gram or 2+ size. Left lobe normal consistency, right lobe normal consistency. Symmetrical lobes. No prostate nodule. Left lobe no tenderness, right lobe no tenderness.  Seminal Vesicles: Nonpalpable.  Sphincter Tone: Normal sphincter. No rectal tenderness. No rectal mass.    MULTI-SYSTEM PHYSICAL EXAMINATION:    Constitutional: Well-nourished. No physical deformities. Normally developed. Good grooming.  Neck: Neck symmetrical, not swollen. Normal tracheal position.  Respiratory: No labored breathing, no use of accessory muscles. Normal breath sounds.  Cardiovascular: Regular rate and rhythm. No murmur, no gallop. Normal temperature, normal extremity pulses, no swelling, no varicosities.  Lymphatic: No enlargement of neck, axillae, groin.  Skin: No paleness, no jaundice, no cyanosis. No lesion, no ulcer, no rash.  Neurologic / Psychiatric: Oriented to time, oriented to place, oriented to person. No depression, no anxiety, no agitation.  Gastrointestinal: No mass, no tenderness, no rigidity, non  obese abdomen.  Eyes: Normal conjunctivae. Normal eyelids.  Ears, Nose, Mouth, and Throat: Left ear no scars, no lesions, no masses. Right ear no scars, no lesions, no masses. Nose no scars, no lesions, no masses. Normal hearing. Normal lips.  Musculoskeletal: Normal gait and station of head and neck.     PAST DATA REVIEWED:  Source Of History:  Patient  Lab Test Review:   PSA  Records Review:   Previous Patient Records  Urodynamics Review:   Review Urodynamics Tests   10/22/17 04/23/17 10/31/16  PSA  Total PSA 3.93 ng/mL 2.73 ng/dl 6.29 ng/dl  Free PSA  5.28 ng/dl   % Free PSA  33 %     PROCEDURES:         Flexible Cystoscopy - 52000  Risks, benefits, and some of the potential complications of the procedure were discussed at length with the patient including infection, bleeding, voiding discomfort, urinary retention, fever, chills, sepsis, and others. All questions were answered. Informed consent was obtained. Antibiotic prophylaxis was given. Sterile technique and intraurethral analgesia were used.  Meatus:  Normal size. Normal location. Normal condition.  Urethra:  No strictures.  External Sphincter:  Normal.  Verumontanum:  Normal.  Prostate:  Enlarged median lobe. Severe hyperplasia. 4-5 cm prostate  Bladder Neck:  Non-obstructing.  Ureteral Orifices:  Normal location. Normal size. Normal shape.  Bladder:  No  trabeculation. No tumors. Normal mucosa. No stones.      The lower urinary tract was carefully examined. The procedure was well-tolerated and without complications. Antibiotic instructions were given. Instructions were given to call the office immediately for bloody urine, difficulty urinating, urinary retention, painful or frequent urination, fever, chills, nausea, vomiting or other illness. The patient stated that he understood these instructions and would comply with them.         Urinalysis w/Scope Dipstick Dipstick Cont'd Micro  Color: Yellow Bilirubin: Neg WBC/hpf:  0 - 5/hpf  Appearance: Cloudy Ketones: Neg RBC/hpf: 3 - 10/hpf  Specific Gravity: 1.025 Blood: 3+ Bacteria: Many (>50/hpf)  pH: <=5.0 Protein: Trace Cystals: NS (Not Seen)  Glucose: 3+ Urobilinogen: 0.2 Casts: NS (Not Seen)    Nitrites: Neg Trichomonas: Not Present    Leukocyte Esterase: Neg Mucous: Present      Epithelial Cells: NS (Not Seen)      Yeast: NS (Not Seen)      Sperm: Not Present    ASSESSMENT:      ICD-10 Details  1 GU:   BPH w/LUTS - N40.1   2   Urinary Retention - R33.8    PLAN:           Orders Labs Urine Culture          Document Letter(s):  Created for Patient: Clinical Summary         Notes:   The patient has failed medical management for his lower urinary tract symptoms. He would like to proceed with surgical resection. I discussed bilateral vaporization of the prostate. I specifically discussed the risks including but not limited to bleeding which could require blood transfusion, infection, and injury to surrounding structures. Also discussed the possibility that the surgery would not improve symptoms though most men have a great improvement in their symptoms. Also discussed the low likelihood but possibility of development of new symptoms such as irritative voiding symptoms or urinary incontinence. Most men will have some degree of urinary urgency and discomfort immediately following the surgery that resolves in a short amount of time. He understands that most often this is an outpatient procedure but occasionally patients require hospitalization for continuous bladder irrigation in the event of excess bleeding. He also understands the possibility of being sent home with a urethral catheter. The patient expressed understanding and is eager to proceed.   He will need cardiology clearance to come off warfarin   cc: Benjiman Core, M.D.    Signed by Modena Slater, III, M.D. on 04/23/18 at 11:42 AM

## 2018-06-05 NOTE — Anesthesia Procedure Notes (Signed)
Procedure Name: Intubation Date/Time: 06/05/2018 8:49 AM Performed by: Ofilia Neas, CRNA Pre-anesthesia Checklist: Patient identified, Emergency Drugs available, Suction available, Patient being monitored and Timeout performed Patient Re-evaluated:Patient Re-evaluated prior to induction Oxygen Delivery Method: Circle system utilized Preoxygenation: Pre-oxygenation with 100% oxygen Induction Type: IV induction and Cricoid Pressure applied Ventilation: Mask ventilation without difficulty and Mask ventilation throughout procedure Laryngoscope Size: Mac and 4 Grade View: Grade II Tube type: Oral Number of attempts: 1 Airway Equipment and Method: Stylet Placement Confirmation: ETT inserted through vocal cords under direct vision,  positive ETCO2 and breath sounds checked- equal and bilateral Secured at: 22 cm Tube secured with: Tape Dental Injury: Teeth and Oropharynx as per pre-operative assessment  Comments: Cords visualized with cricoid pressure, head lift.

## 2018-06-05 NOTE — Anesthesia Postprocedure Evaluation (Signed)
Anesthesia Post Note  Patient: Karl [name]  Procedure(s) Performed: GREEN LIGHT LASER TURP , TRANSURETHRAL RESECTION OF PROSTATE (N/A )     Patient location during evaluation: PACU Anesthesia Type: General Level of consciousness: awake and alert Pain management: pain level controlled Vital Signs Assessment: post-procedure vital signs reviewed and stable Respiratory status: spontaneous breathing, nonlabored ventilation, respiratory function stable and patient connected to nasal cannula oxygen Cardiovascular status: blood pressure returned to baseline and stable Postop Assessment: no apparent nausea or vomiting Anesthetic complications: no    Last Vitals:  Vitals:   06/05/18 1215 06/05/18 1230  BP: 108/76   Pulse: (!) 44 (!) 48  Resp: 12 12  Temp:    SpO2: 100% 100%    Last Pain:  Vitals:   06/05/18 1200  TempSrc:   PainSc: 3                  Trevor Iha

## 2018-06-05 NOTE — Progress Notes (Signed)
Dr. Alben Deeds notified of PT/INR results from today.  Pt was on lovenox bridge from coumadin and last dose of lovenox was last night. No new orders given.

## 2018-06-05 NOTE — Anesthesia Procedure Notes (Signed)
Arterial Line Insertion Start/End7/02/2018 8:00 AM Performed by: Edison Pace, CRNA, CRNA  Patient location: Pre-op. Preanesthetic checklist: patient identified, IV checked, site marked, risks and benefits discussed, surgical consent, monitors and equipment checked, pre-op evaluation and timeout performed Lidocaine 1% used for infiltration and patient sedated Right, radial was placed Catheter size: 20 G Hand hygiene performed , maximum sterile barriers used  and Seldinger technique used Allen's test indicative of satisfactory collateral circulation Attempts: 1 Procedure performed without using ultrasound guided technique. Following insertion, Biopatch and dressing applied. Post procedure assessment: normal  Patient tolerated the procedure well with no immediate complications.

## 2018-06-05 NOTE — Transfer of Care (Signed)
Immediate Anesthesia Transfer of Care Note  Patient: Karl [name]  Procedure(s) Performed: GREEN LIGHT LASER TURP , TRANSURETHRAL RESECTION OF PROSTATE (N/A )  Patient Location: PACU  Anesthesia Type:General  Level of Consciousness: awake, oriented, drowsy, patient cooperative and responds to stimulation  Airway & Oxygen Therapy: Patient Spontanous Breathing and Patient connected to face mask oxygen  Post-op Assessment: Report given to RN, Post -op Vital signs reviewed and stable and Patient moving all extremities  Post vital signs: Reviewed and stable  Last Vitals:  Vitals Value Taken Time  BP 118/75 06/05/2018 10:52 AM  Temp    Pulse 63 06/05/2018 10:56 AM  Resp 15 06/05/2018 10:56 AM  SpO2 99 % 06/05/2018 10:56 AM  Vitals shown include unvalidated device data.  Last Pain:  Vitals:   06/05/18 0650  TempSrc:   PainSc: 0-No pain      Patients Stated Pain Goal: 4 (06/05/18 0650)  Complications: No apparent anesthesia complications

## 2018-06-05 NOTE — Progress Notes (Signed)
Dr. Richardson Landry informed that pt did not take coreg this morning.  Pt usually takes 18.75 mg BID and last dose was last night. Verbal order to give 18.75mg  coreg to pt before surgery.

## 2018-06-05 NOTE — Discharge Instructions (Addendum)
Transurethral Resection of the Prostate (TURP) or Greenlight laser ablation of the Prostate  Continue Lovenox injections until Monday.  At this point, start bridging back over to warfarin.  Care After  Refer to this sheet in the next few weeks. These discharge instructions provide you with general information on caring for yourself after you leave the hospital. Your caregiver may also give you specific instructions. Your treatment has been planned according to the most current medical practices available, but unavoidable complications sometimes occur. If you have any problems or questions after discharge, please call your caregiver.  HOME CARE INSTRUCTIONS   Medications  You may receive medicine for pain management. As your level of discomfort decreases, adjustments in your pain medicines may be made.   Take all medicines as directed.   You may be given a medicine (antibiotic) to kill germs following surgery. Finish all medicines. Let your caregiver know if you have any side effects or problems from the medicine.   If you are on aspirin, it would be best not to restart the aspirin until the blood in the urine clears Hygiene  You can take a shower after surgery.   You should not take a bath while you still have the urethral catheter. Activity  You will be encouraged to get out of bed as much as possible and increase your activity level as tolerated.   Spend the first week in and around your home. For 3 weeks, avoid the following:   Straining.   Running.   Strenuous work.   Walks longer than a few blocks.   Riding for extended periods.   Sexual relations.   Do not lift heavy objects (more than 20 pounds) for at least 1 month. When lifting, use your arms instead of your abdominal muscles.   You will be encouraged to walk as tolerated. Do not exert yourself. Increase your activity level slowly. Remember that it is important to keep moving after an operation of any type. This  cuts down on the possibility of developing blood clots.   Your caregiver will tell you when you can resume driving and light housework. Discuss this at your first office visit after discharge. Diet  No special diet is ordered after a TURP. However, if you are on a special diet for another medical problem, it should be continued.   Normal fluid intake is usually recommended.   Avoid alcohol and caffeinated drinks for 2 weeks. They irritate the bladder. Decaffeinated drinks are okay.   Avoid spicy foods.  Bladder Function  For the first 10 days, empty the bladder whenever you feel a definite desire. Do not try to hold the urine for long periods of time.   Urinating once or twice a night even after you are healed is not uncommon.   You may see some recurrence of blood in the urine after discharge from the hospital. This usually happens within 2 weeks after the procedure.If this occurs, force fluids again as you did in the hospital and reduce your activity.  Bowel Function  You may experience some constipation after surgery. This can be minimized by increasing fluids and fiber in your diet. Drink enough water and fluids to keep your urine clear or pale yellow.   A stool softener may be prescribed for use at home. Do not strain to move your bowels.   If you are requiring increased pain medicine, it is important that you take stool softeners to prevent constipation. This will help to promote proper healing  by reducing the need to strain to move your bowels.  Sexual Activity  Semen movement in the opposite direction and into the bladder (retrograde ejaculation) may occur. Since the semen passes into the bladder, cloudy urine can occur the first time you urinate after intercourse. Or, you may not have an ejaculation during erection. Ask your caregiver when you can resume sexual activity. Retrograde ejaculation and reduced semen discharge should not reduce one's pleasure of intercourse.    Postoperative Visit  Arrange the date and time of your after surgery visit with your caregiver.  Return to Work  After your recovery is complete, you will be able to return to work and resume all activities. Your caregiver will inform you when you can return to work.    Foley Catheter Care A soft, flexible tube (Foley catheter) may have been placed in your bladder to drain urine and fluid. Follow these instructions: Taking Care of the Catheter  Keep the area where the catheter leaves your body clean.   Attach the catheter to the leg so there is no tension on the catheter.   Keep the drainage bag below the level of the bladder, but keep it OFF the floor.   Do not take long soaking baths. Your caregiver will give instructions about showering.   Wash your hands before touching ANYTHING related to the catheter or bag.   Using mild soap and warm water on a washcloth:   Clean the area closest to the catheter insertion site using a circular motion around the catheter.   Clean the catheter itself by wiping AWAY from the insertion site for several inches down the tube.   NEVER wipe upward as this could sweep bacteria up into the urethra (tube in your body that normally drains the bladder) and cause infection.   Place a small amount of sterile lubricant at the tip of the penis where the catheter is entering.  Taking Care of the Drainage Bags  Two drainage bags may be taken home: a large overnight drainage bag, and a smaller leg bag which fits underneath clothing.   It is okay to wear the overnight bag at any time, but NEVER wear the smaller leg bag at night.   Keep the drainage bag well below the level of your bladder. This prevents backflow of urine into the bladder and allows the urine to drain freely.   Anchor the tubing to your leg to prevent pulling or tension on the catheter. Use tape or a leg strap provided by the hospital.   Empty the drainage bag when it is 1/2 to 3/4 full.  Wash your hands before and after touching the bag.   Periodically check the tubing for kinks to make sure there is no pressure on the tubing which could restrict the flow of urine.  Changing the Drainage Bags  Cleanse both ends of the clean bag with alcohol before changing.   Pinch off the rubber catheter to avoid urine spillage during the disconnection.   Disconnect the dirty bag and connect the clean one.   Empty the dirty bag carefully to avoid a urine spill.   Attach the new bag to the leg with tape or a leg strap.  Cleaning the Drainage Bags  Whenever a drainage bag is disconnected, it must be cleaned quickly so it is ready for the next use.   Wash the bag in warm, soapy water.   Rinse the bag thoroughly with warm water.   Soak the bag for  30 minutes in a solution of white vinegar and water (1 cup vinegar to 1 quart warm water).   Rinse with warm water.  SEEK MEDICAL CARE IF:   You have chills or night sweats.   You are leaking around your catheter or have problems with your catheter. It is not uncommon to have sporadic leakage around your catheter as a result of bladder spasms. If the leakage stops, there is not much need for concern. If you are uncertain, call your caregiver.   You develop side effects that you think are coming from your medicines.  SEEK IMMEDIATE MEDICAL CARE IF:   You are suddenly unable to urinate. Check to see if there are any kinks in the drainage tubing that may cause this. If you cannot find any kinks, call your caregiver immediately. This is an emergency.   You develop shortness of breath or chest pains.   Bleeding persists or clots develop in your urine.   You have a fever.   You develop pain in your back or over your lower belly (abdomen).   You develop pain or swelling in your legs.   Any problems you are having get worse rather than better.  MAKE SURE YOU:   Understand these instructions.   Will watch your condition.   Will get  help right away if you are not doing well or get worse.

## 2018-06-05 NOTE — Op Note (Signed)
Preoperative diagnosis: 1. Bladder outlet obstruction secondary to BPH  Postoperative diagnosis:  1. Bladder outlet obstruction secondary to BPH  Procedure:  1. Cystoscopy 2. Transurethral resection and vaporization of the prostate  Surgeon: Ray Church, III. M.D.  Anesthesia: General  Complications: None  EBL: 150 cc  Specimens: 1. Prostate chips  Indication: Karl [name] is a patient with bladder outlet obstruction secondary to benign prostatic hyperplasia. After reviewing the management options for treatment, he elected to proceed with the above surgical procedure(s). We have discussed the potential benefits and risks of the procedure, side effects of the proposed treatment, the likelihood of the patient achieving the goals of the procedure, and any potential problems that might occur during the procedure or recuperation. Informed consent has been obtained.  Description of procedure:  The patient was taken to the operating room and general anesthesia was induced.  The patient was placed in the dorsal lithotomy position, prepped and draped in the usual sterile fashion, and preoperative antibiotics were administered. A preoperative time-out was performed.   Cystourethroscopy was performed.  The patient's urethra was examined and was normal/ demonstrated bilobar prostatic hypertrophy.   The bladder was then systematically examined in its entirety. There was no evidence of any bladder tumors, stones, or other mucosal pathology.  The ureteral orifices were identified so as to be avoided during the procedure.  The prostate adenoma was then vaporized utilizing the greenlight laser.  The prostate adenoma from the bladder neck back to the verumontanum was vaporized beginning at the six o'clock position and then extended to include the right and left lobes of the prostate and anterior prostate. Care was taken not to vaporize distal to the verumontanum.  During the process of laser  vaporization, the prostate bleeding caused poor visualization and therefore this was exchanged for a resectoscope.  The prostate adenoma was then resected utilizing loop cautery resection with the bipolar cutting loop.  The prostate adenoma from the bladder neck back to the verumontanum was resected a small additional amount in addition to the laser vaporization which had already created an excellent channel. Care was taken not to resect distal to the verumontanum.  Hemostasis was then achieved with the cautery and the bladder was emptied and reinspected with no significant bleeding noted at the end of the procedure.    A 24 French 3 way catheter was then placed into the bladder.  The patient appeared to tolerate the procedure well and without complications.  The patient was able to be awakened and transferred to the recovery unit in satisfactory condition.

## 2018-06-06 DIAGNOSIS — N401 Enlarged prostate with lower urinary tract symptoms: Secondary | ICD-10-CM | POA: Diagnosis not present

## 2018-06-06 LAB — GLUCOSE, CAPILLARY: GLUCOSE-CAPILLARY: 124 mg/dL — AB (ref 70–99)

## 2018-06-06 LAB — HEMOGLOBIN AND HEMATOCRIT, BLOOD
HEMATOCRIT: 38.5 % — AB (ref 39.0–52.0)
Hemoglobin: 12.4 g/dL — ABNORMAL LOW (ref 13.0–17.0)

## 2018-06-06 NOTE — Progress Notes (Signed)
Assessment unchanged. Pt verbalized understanding of dc instructions through teach back including follow up care, when to call to doctor, medications to resume, as well as foley care. Education provided by primary RN and Delice Bison, Consulting civil engineer. Converted to leg bag w/ extra leg bag and night bag provided. Discharged via wc to front entrance accompanied by family and NT.

## 2018-06-06 NOTE — Discharge Summary (Signed)
Date of admission: 06/05/2018  Date of discharge: 06/06/2018  Admission diagnosis: urinary retention  Discharge diagnosis: same  Secondary diagnoses:  Patient Active Problem List   Diagnosis Date Noted  . Urinary retention 06/05/2018  . Encounter for therapeutic drug monitoring 02/08/2018  . History of CVA (cerebrovascular accident) 12/20/2017  . Coronary artery disease 12/19/2017  . Mild renal insufficiency 12/19/2017  . TIA (transient ischemic attack) 12/19/2017  . Hip flexor tendinitis 05/22/2017  . Abscess of back 05/22/2017  . Pain of both hip joints 04/25/2017  . Dyslipidemia 09/01/2015  . BPH without urinary obstruction 09/01/2015  . DM (diabetes mellitus), type 2, uncontrolled, periph vascular complic ([place]) 81/27/5170  . Essential hypertension 04/20/2009  . CARDIOMYOPATHY, ISCHEMIC 04/20/2009  . LEFT BUNDLE BRANCH BLOCK 04/20/2009  . Chronic systolic heart failure ([place]) 04/20/2009    Procedures performed: Procedure(s): GREEN LIGHT LASER TURP , TRANSURETHRAL RESECTION OF PROSTATE  History and Physical: For full details, please see admission history and physical. Briefly, Karl [name] is a 62 y.o. year old patient with chronic urinary retention.   PE: NAD Vitals:   06/05/18 1807 06/05/18 2118 06/06/18 0024 06/06/18 0419  BP: (!) 101/59 117/72 131/69 132/70  Pulse: (!) 54 68 67 (!) 59  Resp: _0 Temp: 98.1 F (36.7 C) 99.7 F (37.6 C) 98.9 F (37.2 C) 99.4 F (37.4 C)  TempSrc: Oral Oral Oral Oral  SpO2: 100% 100% 99% 98%  Weight:      Height:        Intake/Output Summary (Last 24 hours) at 06/06/2018 1002 Last data filed at 06/06/2018 1001 Gross per 24 hour  Intake 5567.5 ml  Output 23550 ml  Net -17982.5 ml  Non-labored breathing Abdomen is soft Foley is clear with slow CBI drip.   Hospital Course: Patient tolerated the procedure well.  He was then transferred to the floor after an uneventful PACU stay.  His hospital course was uncomplicated.   On POD#1 he had met discharge criteria: was eating a regular diet, was up and ambulating independently,  pain was well controlled, and was ready to for discharge.  Patient discharged home with foley catheter - scheduled for a voiding trial next week.  He will continue lovenox until Monday and then resume his coumadin.   Laboratory values:  Recent Labs    06/05/18 1140 06/06/18 0454  HGB 12.5* 12.4*  HCT 39.4 38.5*   No results for input(s): NA, K, CL, CO2, GLUCOSE, BUN, CREATININE, CALCIUM in the last 72 hours. Recent Labs    06/05/18 0702  INR 1.05   No results for input(s): LABURIN in the last 72 hours. Results for orders placed or performed in visit on 07/09/14  Urine culture     Status: None   Collection Time: 07/09/14  2:55 PM  Result Value Ref Range Status   Colony Count 6,000 COLONIES/ML  Final   Organism ID, Bacteria Insignificant Growth  Final    Disposition: Home  Discharge instruction: The patient was instructed to be ambulatory but told to refrain from heavy lifting, strenuous activity, or driving.   Discharge medications:  Allergies as of 06/06/2018      Reactions   Lipitor [atorvastatin] Nausea And Vomiting      Medication List    STOP taking these medications   warfarin 10 MG tablet Commonly known as:  COUMADIN     TAKE these medications   carvedilol 12.5 MG tablet Commonly known as:  COREG TAKE 1 & 1/2 (  ONE & ONE-HALF) TABLETS BY MOUTH TWICE DAILY   dapagliflozin propanediol 5 MG Tabs tablet Commonly known as:  FARXIGA Take 5 mg by mouth daily.   enalapril 5 MG tablet Commonly known as:  VASOTEC TAKE 1 TABLET BY MOUTH TWICE DAILY   enoxaparin 100 MG/ML injection Commonly known as:  LOVENOX Inject 1 mL (100 mg total) into the skin every 12 (twelve) hours.   finasteride 5 MG tablet Commonly known as:  PROSCAR Take 5 mg by mouth daily.   HYDROcodone-acetaminophen 5-325 MG tablet Commonly known as:  NORCO/VICODIN Take 1 tablet by mouth  every 4 (four) hours as needed for moderate pain.   metFORMIN 1000 MG tablet Commonly known as:  GLUCOPHAGE TAKE 1 TABLET BY MOUTH TWICE DAILY WITH A MEAL   ONE TOUCH ULTRA TEST test strip Generic drug:  glucose blood 1 each by Other route 2 (two) times daily.   ONETOUCH DELICA LANCETS 75Q Misc 1 each 2 (two) times daily.   tamsulosin 0.4 MG Caps capsule Commonly known as:  FLOMAX Take 1 capsule (0.4 mg total) by mouth daily after supper.       Followup:  Follow-up Information    Karen Kays, NP Follow up on 06/12/2018.   Specialty:  Nurse Practitioner Why:  10am Contact information: [address] [place] [address] [phone]

## 2018-06-07 ENCOUNTER — Encounter (HOSPITAL_COMMUNITY): Payer: Self-pay | Admitting: Urology

## 2018-06-07 ENCOUNTER — Telehealth: Payer: Self-pay

## 2018-06-07 NOTE — Telephone Encounter (Signed)
Pt is on TCM List and admitted on 06/05/2018 and dc'ed on 06/06/2018. Pt admitted due to urinary retention and to undergo a green light laser transurethral resection of prostate. Pt to follow up with urology.

## 2018-06-11 ENCOUNTER — Emergency Department (HOSPITAL_COMMUNITY)
Admission: EM | Admit: 2018-06-11 | Discharge: 2018-06-11 | Disposition: A | Discharge status: Home / Self Care | Payer: Managed Care, Other (non HMO) | Attending: Emergency Medicine | Admitting: Emergency Medicine

## 2018-06-11 ENCOUNTER — Encounter (HOSPITAL_COMMUNITY): Payer: Self-pay | Admitting: Emergency Medicine

## 2018-06-11 DIAGNOSIS — Z7984 Long term (current) use of oral hypoglycemic drugs: Secondary | ICD-10-CM | POA: Insufficient documentation

## 2018-06-11 DIAGNOSIS — Z7901 Long term (current) use of anticoagulants: Secondary | ICD-10-CM | POA: Diagnosis not present

## 2018-06-11 DIAGNOSIS — R638 Other symptoms and signs concerning food and fluid intake: Secondary | ICD-10-CM | POA: Insufficient documentation

## 2018-06-11 DIAGNOSIS — R112 Nausea with vomiting, unspecified: Secondary | ICD-10-CM | POA: Diagnosis not present

## 2018-06-11 DIAGNOSIS — Z79899 Other long term (current) drug therapy: Secondary | ICD-10-CM | POA: Insufficient documentation

## 2018-06-11 DIAGNOSIS — Y69 Unspecified misadventure during surgical and medical care: Secondary | ICD-10-CM | POA: Diagnosis not present

## 2018-06-11 DIAGNOSIS — N390 Urinary tract infection, site not specified: Secondary | ICD-10-CM | POA: Diagnosis not present

## 2018-06-11 DIAGNOSIS — Z8673 Personal history of transient ischemic attack (TIA), and cerebral infarction without residual deficits: Secondary | ICD-10-CM | POA: Insufficient documentation

## 2018-06-11 DIAGNOSIS — I2510 Atherosclerotic heart disease of native coronary artery without angina pectoris: Secondary | ICD-10-CM | POA: Diagnosis not present

## 2018-06-11 DIAGNOSIS — E119 Type 2 diabetes mellitus without complications: Secondary | ICD-10-CM | POA: Diagnosis not present

## 2018-06-11 DIAGNOSIS — E860 Dehydration: Secondary | ICD-10-CM | POA: Diagnosis not present

## 2018-06-11 DIAGNOSIS — I10 Essential (primary) hypertension: Secondary | ICD-10-CM | POA: Diagnosis not present

## 2018-06-11 DIAGNOSIS — Z87891 Personal history of nicotine dependence: Secondary | ICD-10-CM | POA: Insufficient documentation

## 2018-06-11 DIAGNOSIS — T83511A Infection and inflammatory reaction due to indwelling urethral catheter, initial encounter: Secondary | ICD-10-CM | POA: Insufficient documentation

## 2018-06-11 DIAGNOSIS — Z955 Presence of coronary angioplasty implant and graft: Secondary | ICD-10-CM | POA: Diagnosis not present

## 2018-06-11 LAB — URINALYSIS, ROUTINE W REFLEX MICROSCOPIC
BILIRUBIN URINE: NEGATIVE
KETONES UR: 80 mg/dL — AB
NITRITE: POSITIVE — AB
Protein, ur: 100 mg/dL — AB
RBC / HPF: >50 RBC/hpf — ABNORMAL HIGH (ref 0–5)
SPECIFIC GRAVITY, URINE: 1.029 (ref 1.005–1.030)
pH: 5 (ref 5.0–8.0)

## 2018-06-11 LAB — CBC
HCT: 44.2 % (ref 39.0–52.0)
Hemoglobin: 14.9 g/dL (ref 13.0–17.0)
MCH: 25.6 pg — AB (ref 26.0–34.0)
MCHC: 33.7 g/dL (ref 30.0–36.0)
MCV: 75.8 fL — ABNORMAL LOW (ref 78.0–100.0)
PLATELETS: 398 10*3/uL (ref 150–400)
RBC: 5.83 MIL/uL — AB (ref 4.22–5.81)
RDW: 14.4 % (ref 11.5–15.5)
WBC: 11 10*3/uL — AB (ref 4.0–10.5)

## 2018-06-11 LAB — COMPREHENSIVE METABOLIC PANEL
ALK PHOS: 68 U/L (ref 38–126)
ALT: 21 U/L (ref 0–44)
AST: 15 U/L (ref 15–41)
Albumin: 3.7 g/dL (ref 3.5–5.0)
Anion gap: 17 — ABNORMAL HIGH (ref 5–15)
BILIRUBIN TOTAL: 0.9 mg/dL (ref 0.3–1.2)
BUN: 21 mg/dL (ref 8–23)
CALCIUM: 10 mg/dL (ref 8.9–10.3)
CO2: 21 mmol/L — ABNORMAL LOW (ref 22–32)
CREATININE: 1.1 mg/dL (ref 0.61–1.24)
Chloride: 101 mmol/L (ref 98–111)
Glucose, Bld: 166 mg/dL — ABNORMAL HIGH (ref 70–99)
Potassium: 4.3 mmol/L (ref 3.5–5.1)
Sodium: 139 mmol/L (ref 135–145)
TOTAL PROTEIN: 9.2 g/dL — AB (ref 6.5–8.1)

## 2018-06-11 LAB — LIPASE, BLOOD: Lipase: 20 U/L (ref 11–51)

## 2018-06-11 LAB — CBG MONITORING, ED: GLUCOSE-CAPILLARY: 146 mg/dL — AB (ref 70–99)

## 2018-06-11 MED ORDER — ONDANSETRON 4 MG PO TBDP: ORAL_TABLET | ORAL | 0 refills | Status: DC

## 2018-06-11 MED ORDER — CEPHALEXIN 500 MG PO CAPS: 500.0000 mg | ORAL_CAPSULE | Freq: Three times a day (TID) | ORAL | 0 refills | Status: AC

## 2018-06-11 MED ORDER — ONDANSETRON HCL 4 MG/2ML IJ SOLN
4.0000 mg | Freq: Once | INTRAMUSCULAR | Status: AC
  Administered 2018-06-11: 4 mg via INTRAVENOUS
  Filled 2018-06-11: qty 2

## 2018-06-11 MED ORDER — SODIUM CHLORIDE 0.9 % IV BOLUS
1000.0000 mL | Freq: Once | INTRAVENOUS | Status: AC
  Administered 2018-06-11: 1000 mL via INTRAVENOUS

## 2018-06-11 MED ORDER — FAMOTIDINE IN NACL 20-0.9 MG/50ML-% IV SOLN
20.0000 mg | Freq: Once | INTRAVENOUS | Status: AC
  Administered 2018-06-11: 20 mg via INTRAVENOUS
  Filled 2018-06-11: qty 50

## 2018-06-11 NOTE — ED Provider Notes (Signed)
[facility]-EMERGENCY DEPT Provider Note   CSN: [id number] Arrival date & time: 06/11/18  1516     History   Chief Complaint Chief Complaint  Patient presents with  . Emesis    HPI Karl [name] is a 62 y.o. male.  Karl [name] is a 62 y.o. Male with a history of coronary artery disease, diabetes, hypertension and chronic dyspnea, who presents to the emergency department for evaluation of 2 days of nausea and vomiting.  He reports he has had several episodes of nonbloody emesis, and is felt extremely nauseated and has not been able to keep anything down he reports decreased appetite.  He denies any abdominal pain or discomfort.  He denies any diarrhea, melena or hematochezia, has been passing regular soft brown bowel movements, he is currently on stool softeners after recent urologic procedure, to prevent constipation with pain meds.  He denies any associated chest pain or shortness of breath outside of his typical.  He reports he feels a bit weak and fatigued since he has not had anything substantial to eat.  He denies feeling lightheaded has not had any syncopal events.  He saw his urologist in follow-up today and they felt that he appeared dehydrated and may require some IV fluids and recommended he come to the emergency department.  He was recently seen for urinary retention and admitted for TURP procedure, since then has been urinating without difficulty.     Past Medical History:  Diagnosis Date  . Coronary artery disease    blockage   Stent Dr. Ladona Ridgel 15 years 1998  . Diabetes mellitus (HCC) 07/2014 dx   type 2  . Dyspnea   . Hypertension     Patient Active Problem List   Diagnosis Date Noted  . Urinary retention 06/05/2018  . Encounter for therapeutic drug monitoring 02/08/2018  . History of CVA (cerebrovascular accident) 12/20/2017  . Coronary artery disease 12/19/2017  . Mild renal insufficiency 12/19/2017  . TIA (transient ischemic attack)  12/19/2017  . Hip flexor tendinitis 05/22/2017  . Abscess of back 05/22/2017  . Pain of both hip joints 04/25/2017  . Dyslipidemia 09/01/2015  . BPH without urinary obstruction 09/01/2015  . DM (diabetes mellitus), type 2, uncontrolled, periph vascular complic (HCC) 07/10/2014  . Essential hypertension 04/20/2009  . CARDIOMYOPATHY, ISCHEMIC 04/20/2009  . LEFT BUNDLE BRANCH BLOCK 04/20/2009  . Chronic systolic heart failure (HCC) 04/20/2009    Past Surgical History:  Procedure Laterality Date  . CORONARY STENT PLACEMENT  1998  . GREEN LIGHT LASER TURP (TRANSURETHRAL RESECTION OF PROSTATE N/A 06/05/2018   Procedure: GREEN LIGHT LASER TURP , TRANSURETHRAL RESECTION OF PROSTATE;  Surgeon: Crista Elliot, MD;  Location: [facility];  Service: Urology;  Laterality: N/A;  . NO PAST SURGERIES          Home Medications    Prior to Admission medications   Medication Sig Start Date End Date Taking? Authorizing Provider  carvedilol (COREG) 12.5 MG tablet TAKE 1 & 1/2 (ONE & ONE-HALF) TABLETS BY MOUTH TWICE DAILY 10/16/17  Yes Marinus Maw, MD  dapagliflozin propanediol (FARXIGA) 5 MG TABS tablet Take 5 mg by mouth daily. 02/06/18  Yes Pincus Sanes, MD  enalapril (VASOTEC) 5 MG tablet TAKE 1 TABLET BY MOUTH TWICE DAILY 05/06/18  Yes Marinus Maw, MD  finasteride (PROSCAR) 5 MG tablet Take 5 mg by mouth daily. 04/08/18  Yes [provider]  metFORMIN (GLUCOPHAGE) 1000 MG tablet TAKE 1 TABLET  BY MOUTH TWICE DAILY WITH A MEAL 06/03/18  Yes Burns, Bobette Mo, MD  tamsulosin (FLOMAX) 0.4 MG CAPS capsule Take 1 capsule (0.4 mg total) by mouth daily after supper. 02/06/18  Yes Burns, Bobette Mo, MD  warfarin (COUMADIN) 10 MG tablet Take 10 mg by mouth daily.   Yes [provider]  enoxaparin (LOVENOX) 100 MG/ML injection Inject 1 mL (100 mg total) into the skin every 12 (twelve) hours. Patient not taking: Reported on 06/11/2018 05/29/18   Marinus Maw, MD  HYDROcodone-acetaminophen  (NORCO/VICODIN) 5-325 MG tablet Take 1 tablet by mouth every 4 (four) hours as needed for moderate pain. 06/05/18 06/05/19  Crista Elliot, MD  ONE TOUCH ULTRA TEST test strip 1 each by Other route 2 (two) times daily.  08/05/14   [provider]  Dola Argyle LANCETS 33G MISC 1 each 2 (two) times daily.  08/07/14   [provider]    Family History Family History  Problem Relation Age of Onset  . Diabetes Mother   . Diabetes Father   . Stroke Father 19  . Diabetes Sister   . Clotting disorder Sister 58  . Diabetes Other        nephew  . Diabetes Brother   . Diabetes Maternal Grandmother     Social History Social History   Tobacco Use  . Smoking status: Former Smoker    Packs/day: 2.00    Years: 5.00    Pack years: 10.00    Types: Cigarettes    Last attempt to quit: 12/04/2000    Years since quitting: 17.5  . Smokeless tobacco: Never Used  Substance Use Topics  . Alcohol use: Not Currently    Comment: weekends  . Drug use: Never     Allergies   Lipitor [atorvastatin]   Review of Systems Review of Systems  Constitutional: Negative for chills and fever.  HENT: Negative.   Eyes: Negative for visual disturbance.  Respiratory: Negative for cough and shortness of breath.   Cardiovascular: Negative for chest pain.  Gastrointestinal: Positive for nausea and vomiting. Negative for abdominal distention, blood in stool, constipation and diarrhea.  Genitourinary: Negative for difficulty urinating, dysuria, flank pain and frequency.       Foley catheter in place  Musculoskeletal: Negative for arthralgias and myalgias.  Skin: Negative for color change and rash.  Neurological: Negative for dizziness, syncope and light-headedness.     Physical Exam Updated Vital Signs BP (!) 163/98 (BP Location: Right Arm)   Pulse (!) 103   Temp 98.1 F (36.7 C) (Oral)   Resp 20   Ht 6' 2.5" (1.892 m)   Wt 105.2 kg (232 lb)   SpO2 100%   BMI 29.39 kg/m   Physical  Exam  Constitutional: He is oriented to person, place, and time. He appears well-developed and well-nourished. No distress.  HENT:  Head: Normocephalic and atraumatic.  Mouth/Throat: Oropharynx is clear and moist.  Eyes: Right eye exhibits no discharge. Left eye exhibits no discharge.  Neck: Neck supple.  Cardiovascular: Normal rate, regular rhythm, normal heart sounds and intact distal pulses.  Pulmonary/Chest: Effort normal and breath sounds normal. No stridor. No respiratory distress. He has no wheezes. He has no rales.  Respirations equal and unlabored, patient able to speak in full sentences, lungs clear to auscultation bilaterally  Abdominal: Soft. Bowel sounds are normal. He exhibits no distension and no mass. There is no tenderness. There is no guarding.  Abdomen soft, nondistended, nontender to palpation  in all quadrants without guarding or peritoneal signs, no CVA tenderness  Genitourinary:  Genitourinary Comments: Foley catheter in place with gross hematuria, as expected  Musculoskeletal: He exhibits no edema or deformity.  Neurological: He is alert and oriented to person, place, and time. Coordination normal.  Skin: Skin is warm and dry. He is not diaphoretic.  Psychiatric: He has a normal mood and affect. His behavior is normal.  Nursing note and vitals reviewed.    ED Treatments / Results  Labs (all labs ordered are listed, but only abnormal results are displayed) Labs Reviewed  COMPREHENSIVE METABOLIC PANEL - Abnormal; Notable for the following components:      Result Value   CO2 21 (*)    Glucose, Bld 166 (*)    Total Protein 9.2 (*)    Anion gap 17 (*)    All other components within normal limits  CBC - Abnormal; Notable for the following components:   WBC 11.0 (*)    RBC 5.83 (*)    MCV 75.8 (*)    MCH 25.6 (*)    All other components within normal limits  URINALYSIS, ROUTINE W REFLEX MICROSCOPIC - Abnormal; Notable for the following components:   APPearance  HAZY (*)    Glucose, UA >=500 (*)    Hgb urine dipstick LARGE (*)    Ketones, ur 80 (*)    Protein, ur 100 (*)    Nitrite POSITIVE (*)    Leukocytes, UA LARGE (*)    RBC / HPF >50 (*)    WBC, UA >50 (*)    Bacteria, UA MANY (*)    All other components within normal limits  CBG MONITORING, ED - Abnormal; Notable for the following components:   Glucose-Capillary 146 (*)    All other components within normal limits  URINE CULTURE  LIPASE, BLOOD    EKG None  Radiology No results found.  Procedures Procedures (including critical care time)  Medications Ordered in ED Medications  ondansetron (ZOFRAN) injection 4 mg (4 mg Intravenous Given 06/11/18 1702)  sodium chloride 0.9 % bolus 1,000 mL (0 mLs Intravenous Stopped 06/11/18 1812)  famotidine (PEPCID) IVPB 20 mg premix (0 mg Intravenous Stopped 06/11/18 1812)     Initial Impression / Assessment and Plan / ED Course  I have reviewed the triage vital signs and the nursing notes.  Pertinent labs & imaging results that were available during my care of the patient were reviewed by me and considered in my medical decision making (see chart for details).  Patient presents for evaluation of 2 days of persistent nausea and vomiting, no hematemesis.  Patient denies fevers, abdominal pain, diarrhea.  Patient has Foley catheter in place after urologic procedure last week, was seen by Dr. Alvester Morin in the office today.  Reports no issues with this thus far, pain has been well managed outpatient.  "Arrival patient is mildly tachycardic at 103 at hypertensive, vitals otherwise normal, patient was not able to keep down his blood pressure medication this morning.  Abdominal exam is completely benign there is no focal suprapubic tenderness.  Will get abdominal labs and give 1 L IV fluids and Zofran.  Labs overall reassuring, minimal leukocytosis of 11, normal hemoglobin, no acute electrolyte derangements requiring intervention.  Normal renal and liver  function and normal lipase.  Urinalysis with ketones and protein suggesting dehydration, it is positive for nitrates and leukocytes with greater than 50 RBCs and WBCs.  This is not surprising given patient has chronic indwelling catheter,  will discuss with urology as to whether or not to treat these findings with antibiotics.  Discussed urinalysis results with Dr. Margarita Grizzle with urology, who recommends a short course of antibiotics, will treat with 5 days of Keflex, urine culture sent.  Patient is tolerating p.o. with significant improvement in symptoms and nausea.  At this time patient is stable for discharge home with Zofran and Keflex and close follow-up with his primary care doctor and urologist.  Return precautions discussed.  Patient expresses understanding and is in agreement with plan, his daughter was contacted for transport home.  Final Clinical Impressions(s) / ED Diagnoses   Final diagnoses:  Non-intractable vomiting with nausea, unspecified vomiting type  Dehydration  Urinary tract infection associated with indwelling urethral catheter, initial encounter [facility])    ED Discharge Orders    None       Legrand Rams 06/11/18 2036    Mancel Bale, MD 06/12/18 [phone]

## 2018-06-11 NOTE — Discharge Instructions (Signed)
Please take antibiotics as directed for the next 5 days.  Use Zofran to help with nausea and make sure you are drinking plenty of fluids.  Advance diet slowly with bland food as tolerated.  Please follow-up with your urologist and primary doctor.  Return to the emergency department if vomiting persist despite medications, you develop fever, abdominal pain, feel dizzy or lightheaded or any other new or concerning symptoms.

## 2018-06-11 NOTE — ED Triage Notes (Signed)
Patient c/o N/V x2 days. Denies abdominal pain and diarrhea. States he was seen at PCP today and sent for further evaluation.

## 2018-06-14 ENCOUNTER — Emergency Department (HOSPITAL_COMMUNITY): Payer: Managed Care, Other (non HMO)

## 2018-06-14 ENCOUNTER — Other Ambulatory Visit (HOSPITAL_COMMUNITY): Payer: Managed Care, Other (non HMO)

## 2018-06-14 ENCOUNTER — Inpatient Hospital Stay (HOSPITAL_COMMUNITY): Payer: Managed Care, Other (non HMO)

## 2018-06-14 ENCOUNTER — Inpatient Hospital Stay (HOSPITAL_COMMUNITY)
Admission: EM | Admit: 2018-06-14 | Discharge: 2018-06-18 | DRG: 065 | Disposition: A | Discharge status: Rehabilitation Facility | Payer: Managed Care, Other (non HMO) | Attending: Internal Medicine | Admitting: Internal Medicine

## 2018-06-14 ENCOUNTER — Encounter (HOSPITAL_COMMUNITY): Payer: Self-pay

## 2018-06-14 ENCOUNTER — Other Ambulatory Visit: Payer: Self-pay

## 2018-06-14 DIAGNOSIS — I63333 Cerebral infarction due to thrombosis of bilateral posterior cerebral arteries: Secondary | ICD-10-CM | POA: Diagnosis not present

## 2018-06-14 DIAGNOSIS — E1165 Type 2 diabetes mellitus with hyperglycemia: Secondary | ICD-10-CM | POA: Diagnosis not present

## 2018-06-14 DIAGNOSIS — I639 Cerebral infarction, unspecified: Secondary | ICD-10-CM

## 2018-06-14 DIAGNOSIS — Z823 Family history of stroke: Secondary | ICD-10-CM | POA: Diagnosis not present

## 2018-06-14 DIAGNOSIS — Z955 Presence of coronary angioplasty implant and graft: Secondary | ICD-10-CM

## 2018-06-14 DIAGNOSIS — Z87891 Personal history of nicotine dependence: Secondary | ICD-10-CM

## 2018-06-14 DIAGNOSIS — H53462 Homonymous bilateral field defects, left side: Secondary | ICD-10-CM | POA: Diagnosis not present

## 2018-06-14 DIAGNOSIS — I5022 Chronic systolic (congestive) heart failure: Secondary | ICD-10-CM | POA: Diagnosis not present

## 2018-06-14 DIAGNOSIS — R29700 NIHSS score 0: Secondary | ICD-10-CM | POA: Diagnosis not present

## 2018-06-14 DIAGNOSIS — E1159 Type 2 diabetes mellitus with other circulatory complications: Secondary | ICD-10-CM | POA: Diagnosis present

## 2018-06-14 DIAGNOSIS — E119 Type 2 diabetes mellitus without complications: Secondary | ICD-10-CM | POA: Diagnosis not present

## 2018-06-14 DIAGNOSIS — E876 Hypokalemia: Secondary | ICD-10-CM | POA: Diagnosis present

## 2018-06-14 DIAGNOSIS — N390 Urinary tract infection, site not specified: Secondary | ICD-10-CM | POA: Diagnosis present

## 2018-06-14 DIAGNOSIS — E1151 Type 2 diabetes mellitus with diabetic peripheral angiopathy without gangrene: Secondary | ICD-10-CM | POA: Diagnosis not present

## 2018-06-14 DIAGNOSIS — I63431 Cerebral infarction due to embolism of right posterior cerebral artery: Secondary | ICD-10-CM | POA: Diagnosis not present

## 2018-06-14 DIAGNOSIS — I255 Ischemic cardiomyopathy: Secondary | ICD-10-CM

## 2018-06-14 DIAGNOSIS — I63531 Cerebral infarction due to unspecified occlusion or stenosis of right posterior cerebral artery: Secondary | ICD-10-CM | POA: Diagnosis not present

## 2018-06-14 DIAGNOSIS — Z7289 Other problems related to lifestyle: Secondary | ICD-10-CM

## 2018-06-14 DIAGNOSIS — I340 Nonrheumatic mitral (valve) insufficiency: Secondary | ICD-10-CM | POA: Diagnosis not present

## 2018-06-14 DIAGNOSIS — I472 Ventricular tachycardia: Secondary | ICD-10-CM | POA: Diagnosis not present

## 2018-06-14 DIAGNOSIS — I6340 Cerebral infarction due to embolism of unspecified cerebral artery: Principal | ICD-10-CM | POA: Diagnosis present

## 2018-06-14 DIAGNOSIS — I10 Essential (primary) hypertension: Secondary | ICD-10-CM | POA: Diagnosis present

## 2018-06-14 DIAGNOSIS — R7309 Other abnormal glucose: Secondary | ICD-10-CM | POA: Diagnosis not present

## 2018-06-14 DIAGNOSIS — I2510 Atherosclerotic heart disease of native coronary artery without angina pectoris: Secondary | ICD-10-CM | POA: Diagnosis not present

## 2018-06-14 DIAGNOSIS — Z7901 Long term (current) use of anticoagulants: Secondary | ICD-10-CM | POA: Diagnosis not present

## 2018-06-14 DIAGNOSIS — K5900 Constipation, unspecified: Secondary | ICD-10-CM | POA: Diagnosis not present

## 2018-06-14 DIAGNOSIS — R338 Other retention of urine: Secondary | ICD-10-CM | POA: Diagnosis present

## 2018-06-14 DIAGNOSIS — I5042 Chronic combined systolic (congestive) and diastolic (congestive) heart failure: Secondary | ICD-10-CM | POA: Diagnosis not present

## 2018-06-14 DIAGNOSIS — I110 Hypertensive heart disease with heart failure: Secondary | ICD-10-CM | POA: Diagnosis present

## 2018-06-14 DIAGNOSIS — Z888 Allergy status to other drugs, medicaments and biological substances status: Secondary | ICD-10-CM

## 2018-06-14 DIAGNOSIS — I429 Cardiomyopathy, unspecified: Secondary | ICD-10-CM | POA: Diagnosis present

## 2018-06-14 DIAGNOSIS — H5347 Heteronymous bilateral field defects: Secondary | ICD-10-CM | POA: Diagnosis not present

## 2018-06-14 DIAGNOSIS — R339 Retention of urine, unspecified: Secondary | ICD-10-CM | POA: Diagnosis not present

## 2018-06-14 DIAGNOSIS — N401 Enlarged prostate with lower urinary tract symptoms: Secondary | ICD-10-CM | POA: Diagnosis not present

## 2018-06-14 DIAGNOSIS — I513 Intracardiac thrombosis, not elsewhere classified: Secondary | ICD-10-CM | POA: Diagnosis not present

## 2018-06-14 DIAGNOSIS — R791 Abnormal coagulation profile: Secondary | ICD-10-CM | POA: Diagnosis not present

## 2018-06-14 DIAGNOSIS — Z79899 Other long term (current) drug therapy: Secondary | ICD-10-CM

## 2018-06-14 DIAGNOSIS — R112 Nausea with vomiting, unspecified: Secondary | ICD-10-CM | POA: Diagnosis not present

## 2018-06-14 DIAGNOSIS — Z7984 Long term (current) use of oral hypoglycemic drugs: Secondary | ICD-10-CM

## 2018-06-14 DIAGNOSIS — N400 Enlarged prostate without lower urinary tract symptoms: Secondary | ICD-10-CM | POA: Diagnosis not present

## 2018-06-14 DIAGNOSIS — E785 Hyperlipidemia, unspecified: Secondary | ICD-10-CM | POA: Diagnosis not present

## 2018-06-14 DIAGNOSIS — G8194 Hemiplegia, unspecified affecting left nondominant side: Secondary | ICD-10-CM | POA: Diagnosis present

## 2018-06-14 DIAGNOSIS — E871 Hypo-osmolality and hyponatremia: Secondary | ICD-10-CM | POA: Diagnosis not present

## 2018-06-14 DIAGNOSIS — Z8673 Personal history of transient ischemic attack (TIA), and cerebral infarction without residual deficits: Secondary | ICD-10-CM

## 2018-06-14 DIAGNOSIS — R0989 Other specified symptoms and signs involving the circulatory and respiratory systems: Secondary | ICD-10-CM | POA: Diagnosis not present

## 2018-06-14 HISTORY — DX: Type 2 diabetes mellitus without complications: E11.9

## 2018-06-14 HISTORY — DX: Cerebral infarction, unspecified: I63.9

## 2018-06-14 LAB — COMPREHENSIVE METABOLIC PANEL
ALT: 17 U/L (ref 0–44)
AST: 13 U/L — ABNORMAL LOW (ref 15–41)
Albumin: 3.7 g/dL (ref 3.5–5.0)
Alkaline Phosphatase: 65 U/L (ref 38–126)
Anion gap: 19 — ABNORMAL HIGH (ref 5–15)
BUN: 30 mg/dL — ABNORMAL HIGH (ref 8–23)
CO2: 23 mmol/L (ref 22–32)
CREATININE: 1.21 mg/dL (ref 0.61–1.24)
Calcium: 9.7 mg/dL (ref 8.9–10.3)
Chloride: 96 mmol/L — ABNORMAL LOW (ref 98–111)
GFR calc non Af Amer: >60 mL/min (ref >60)
Glucose, Bld: 172 mg/dL — ABNORMAL HIGH (ref 70–99)
Potassium: 4.1 mmol/L (ref 3.5–5.1)
SODIUM: 138 mmol/L (ref 135–145)
Total Bilirubin: 1.2 mg/dL (ref 0.3–1.2)
Total Protein: 8.8 g/dL — ABNORMAL HIGH (ref 6.5–8.1)

## 2018-06-14 LAB — GLUCOSE, CAPILLARY
GLUCOSE-CAPILLARY: 173 mg/dL — AB (ref 70–99)
Glucose-Capillary: 154 mg/dL — ABNORMAL HIGH (ref 70–99)

## 2018-06-14 LAB — URINALYSIS, ROUTINE W REFLEX MICROSCOPIC
Bilirubin Urine: NEGATIVE
Glucose, UA: >=500 mg/dL — AB
KETONES UR: 80 mg/dL — AB
Nitrite: NEGATIVE
PROTEIN: 30 mg/dL — AB
Specific Gravity, Urine: 1.031 — ABNORMAL HIGH (ref 1.005–1.030)
pH: 5 (ref 5.0–8.0)

## 2018-06-14 LAB — URINE CULTURE: Culture: >=100000 — AB

## 2018-06-14 LAB — CBC
HCT: 46 % (ref 39.0–52.0)
Hemoglobin: 15.5 g/dL (ref 13.0–17.0)
MCH: 25.5 pg — ABNORMAL LOW (ref 26.0–34.0)
MCHC: 33.7 g/dL (ref 30.0–36.0)
MCV: 75.8 fL — AB (ref 78.0–100.0)
PLATELETS: 387 10*3/uL (ref 150–400)
RBC: 6.07 MIL/uL — ABNORMAL HIGH (ref 4.22–5.81)
RDW: 14.3 % (ref 11.5–15.5)
WBC: 10.9 10*3/uL — AB (ref 4.0–10.5)

## 2018-06-14 LAB — ETHANOL

## 2018-06-14 LAB — DIFFERENTIAL
BASOS ABS: 0 10*3/uL (ref 0.0–0.1)
BASOS PCT: 0 %
Eosinophils Absolute: 0.1 10*3/uL (ref 0.0–0.7)
Eosinophils Relative: 1 %
Lymphocytes Relative: 14 %
Lymphs Abs: 1.5 10*3/uL (ref 0.7–4.0)
MONO ABS: 1.5 10*3/uL — AB (ref 0.1–1.0)
Monocytes Relative: 14 %
NEUTROS ABS: 7.8 10*3/uL — AB (ref 1.7–7.7)
Neutrophils Relative %: 71 %

## 2018-06-14 LAB — RAPID URINE DRUG SCREEN, HOSP PERFORMED
AMPHETAMINES: NOT DETECTED
Benzodiazepines: NOT DETECTED
COCAINE: NOT DETECTED
OPIATES: NOT DETECTED
Tetrahydrocannabinol: NOT DETECTED

## 2018-06-14 LAB — PROTIME-INR
INR: 1.16
PROTHROMBIN TIME: 14.7 s (ref 11.4–15.2)

## 2018-06-14 LAB — I-STAT TROPONIN, ED: Troponin i, poc: 0.02 ng/mL (ref 0.00–0.08)

## 2018-06-14 LAB — CBG MONITORING, ED: GLUCOSE-CAPILLARY: 182 mg/dL — AB (ref 70–99)

## 2018-06-14 LAB — APTT: APTT: 26 s (ref 24–36)

## 2018-06-14 MED ORDER — IOPAMIDOL (ISOVUE-370) INJECTION 76%
INTRAVENOUS | Status: AC
  Filled 2018-06-14: qty 50

## 2018-06-14 MED ORDER — ONDANSETRON 4 MG PO TBDP
4.0000 mg | ORAL_TABLET | ORAL | Status: DC | PRN
  Administered 2018-06-15: 4 mg via ORAL
  Filled 2018-06-14: qty 1

## 2018-06-14 MED ORDER — FINASTERIDE 5 MG PO TABS
5.0000 mg | ORAL_TABLET | Freq: Every day | ORAL | Status: DC
  Administered 2018-06-14 – 2018-06-18 (×5): 5 mg via ORAL
  Filled 2018-06-14 (×5): qty 1

## 2018-06-14 MED ORDER — INSULIN ASPART 100 UNIT/ML ~~LOC~~ SOLN
0.0000 [IU] | Freq: Three times a day (TID) | SUBCUTANEOUS | Status: DC
  Administered 2018-06-14 – 2018-06-15 (×3): 2 [IU] via SUBCUTANEOUS
  Administered 2018-06-15 – 2018-06-16 (×2): 1 [IU] via SUBCUTANEOUS
  Administered 2018-06-16 – 2018-06-17 (×4): 2 [IU] via SUBCUTANEOUS
  Administered 2018-06-17: 3 [IU] via SUBCUTANEOUS
  Administered 2018-06-18: 1 [IU] via SUBCUTANEOUS
  Administered 2018-06-18: 2 [IU] via SUBCUTANEOUS
  Administered 2018-06-18: 3 [IU] via SUBCUTANEOUS

## 2018-06-14 MED ORDER — SENNOSIDES-DOCUSATE SODIUM 8.6-50 MG PO TABS
1.0000 | ORAL_TABLET | Freq: Every evening | ORAL | Status: DC | PRN
  Administered 2018-06-15: 1 via ORAL
  Filled 2018-06-14: qty 1

## 2018-06-14 MED ORDER — STROKE: EARLY STAGES OF RECOVERY BOOK
Freq: Once | Status: AC
  Administered 2018-06-14: 23:00:00

## 2018-06-14 MED ORDER — ENSURE ENLIVE PO LIQD
237.0000 mL | Freq: Two times a day (BID) | ORAL | Status: DC
  Administered 2018-06-14 – 2018-06-17 (×5): 237 mL via ORAL

## 2018-06-14 MED ORDER — TAMSULOSIN HCL 0.4 MG PO CAPS
0.4000 mg | ORAL_CAPSULE | Freq: Every day | ORAL | Status: DC
  Administered 2018-06-14 – 2018-06-18 (×5): 0.4 mg via ORAL
  Filled 2018-06-14 (×5): qty 1

## 2018-06-14 MED ORDER — ACETAMINOPHEN 325 MG PO TABS
650.0000 mg | ORAL_TABLET | ORAL | Status: DC | PRN
  Administered 2018-06-15: 650 mg via ORAL
  Filled 2018-06-14: qty 2

## 2018-06-14 MED ORDER — HYDROCODONE-ACETAMINOPHEN 5-325 MG PO TABS: 1.0000 | ORAL_TABLET | ORAL | Status: DC | PRN

## 2018-06-14 MED ORDER — ACETAMINOPHEN 160 MG/5ML PO SOLN: 650.0000 mg | ORAL | Status: DC | PRN

## 2018-06-14 MED ORDER — WARFARIN SODIUM 5 MG PO TABS
10.0000 mg | ORAL_TABLET | Freq: Once | ORAL | Status: AC
  Administered 2018-06-14: 10 mg via ORAL
  Filled 2018-06-14: qty 1
  Filled 2018-06-14: qty 2

## 2018-06-14 MED ORDER — IOPAMIDOL (ISOVUE-370) INJECTION 76%
50.0000 mL | Freq: Once | INTRAVENOUS | Status: AC | PRN
  Administered 2018-06-14: 50 mL via INTRAVENOUS

## 2018-06-14 MED ORDER — CEPHALEXIN 500 MG PO CAPS
500.0000 mg | ORAL_CAPSULE | Freq: Three times a day (TID) | ORAL | Status: DC
  Administered 2018-06-14 – 2018-06-18 (×13): 500 mg via ORAL
  Filled 2018-06-14 (×13): qty 1

## 2018-06-14 MED ORDER — ACETAMINOPHEN 650 MG RE SUPP: 650.0000 mg | RECTAL | Status: DC | PRN

## 2018-06-14 MED ORDER — WARFARIN - PHARMACIST DOSING INPATIENT
Freq: Every day | Status: DC
  Administered 2018-06-17: 18:00:00

## 2018-06-14 NOTE — ED Notes (Signed)
Called Report to 3W at [facility], [facility] called.

## 2018-06-14 NOTE — Progress Notes (Signed)
Patient off the floor for test. 

## 2018-06-14 NOTE — ED Provider Notes (Signed)
[facility]-EMERGENCY DEPT Provider Note   CSN: [id number] Arrival date & time: 06/14/18  7829     History   Chief Complaint Chief Complaint  Patient presents with  . leg numbness  . Aphasia    HPI Karl [name] is a 62 y.o. male.  HPI Patient presented to the emergency room for an episode of dizziness.  Patient has been in the hospital recently getting treated for prostate enlargement with a transurethral resection of his prostate on July 3.  Patient also had issues with urinary retention has an indwelling catheter.  Patient states he went to the bathroom this morning.  When he stood up he felt lightheaded and dizzy.  He felt like he had to hold onto the walls.  He denied any focal weakness in his arms or legs.  Patient did not mention any difficulties with his speech.  No vomiting or diarrhea.  No fevers or chills.  Family is concerned because in the past he had a stroke with similar symptoms.  Patient denies any other deficits at this time.  He denies any pain.  No fevers. Past Medical History:  Diagnosis Date  . Coronary artery disease    blockage   Stent Dr. Ladona Ridgel 15 years 1998  . Diabetes mellitus (HCC) 07/2014 dx   type 2  . Dyspnea   . Hypertension     Patient Active Problem List   Diagnosis Date Noted  . Urinary retention 06/05/2018  . Encounter for therapeutic drug monitoring 02/08/2018  . History of CVA (cerebrovascular accident) 12/20/2017  . Coronary artery disease 12/19/2017  . Mild renal insufficiency 12/19/2017  . TIA (transient ischemic attack) 12/19/2017  . Hip flexor tendinitis 05/22/2017  . Abscess of back 05/22/2017  . Pain of both hip joints 04/25/2017  . Dyslipidemia 09/01/2015  . BPH without urinary obstruction 09/01/2015  . DM (diabetes mellitus), type 2, uncontrolled, periph vascular complic (HCC) 07/10/2014  . Essential hypertension 04/20/2009  . CARDIOMYOPATHY, ISCHEMIC 04/20/2009  . LEFT BUNDLE BRANCH BLOCK 04/20/2009    . Chronic systolic heart failure (HCC) 04/20/2009    Past Surgical History:  Procedure Laterality Date  . CORONARY STENT PLACEMENT  1998  . GREEN LIGHT LASER TURP (TRANSURETHRAL RESECTION OF PROSTATE N/A 06/05/2018   Procedure: GREEN LIGHT LASER TURP , TRANSURETHRAL RESECTION OF PROSTATE;  Surgeon: Crista Elliot, MD;  Location: [facility];  Service: Urology;  Laterality: N/A;  . NO PAST SURGERIES          Home Medications    Prior to Admission medications   Medication Sig Start Date End Date Taking? Authorizing Provider  carvedilol (COREG) 12.5 MG tablet TAKE 1 & 1/2 (ONE & ONE-HALF) TABLETS BY MOUTH TWICE DAILY 10/16/17   Marinus Maw, MD  cephALEXin (KEFLEX) 500 MG capsule Take 1 capsule (500 mg total) by mouth 3 (three) times daily for 5 days. 06/11/18 06/16/18  Dartha Lodge, PA-C  dapagliflozin propanediol (FARXIGA) 5 MG TABS tablet Take 5 mg by mouth daily. 02/06/18   Pincus Sanes, MD  enalapril (VASOTEC) 5 MG tablet TAKE 1 TABLET BY MOUTH TWICE DAILY 05/06/18   Marinus Maw, MD  enoxaparin (LOVENOX) 100 MG/ML injection Inject 1 mL (100 mg total) into the skin every 12 (twelve) hours. Patient not taking: Reported on 06/11/2018 05/29/18   Marinus Maw, MD  finasteride (PROSCAR) 5 MG tablet Take 5 mg by mouth daily. 04/08/18   [provider]  HYDROcodone-acetaminophen (NORCO/VICODIN) 5-325 MG  tablet Take 1 tablet by mouth every 4 (four) hours as needed for moderate pain. 06/05/18 06/05/19  Ray Church III, MD  metFORMIN (GLUCOPHAGE) 1000 MG tablet TAKE 1 TABLET BY MOUTH TWICE DAILY WITH A MEAL 06/03/18   Pincus Sanes, MD  ondansetron (ZOFRAN ODT) 4 MG disintegrating tablet 4mg  ODT q4 hours prn nausea/vomit 06/11/18   Dartha Lodge, PA-C  ONE TOUCH ULTRA TEST test strip 1 each by Other route 2 (two) times daily.  08/05/14   [provider]  Dola Argyle LANCETS 33G MISC 1 each 2 (two) times daily.  08/07/14   [provider]  tamsulosin (FLOMAX) 0.4 MG CAPS  capsule Take 1 capsule (0.4 mg total) by mouth daily after supper. 02/06/18   Pincus Sanes, MD  warfarin (COUMADIN) 10 MG tablet Take 10 mg by mouth daily.    [provider]    Family History Family History  Problem Relation Age of Onset  . Diabetes Mother   . Diabetes Father   . Stroke Father 38  . Diabetes Sister   . Clotting disorder Sister 1  . Diabetes Other        nephew  . Diabetes Brother   . Diabetes Maternal Grandmother     Social History Social History   Tobacco Use  . Smoking status: Former Smoker    Packs/day: 2.00    Years: 5.00    Pack years: 10.00    Types: Cigarettes    Last attempt to quit: 12/04/2000    Years since quitting: 17.5  . Smokeless tobacco: Never Used  Substance Use Topics  . Alcohol use: Not Currently    Comment: weekends  . Drug use: Never     Allergies   Lipitor [atorvastatin]   Review of Systems Review of Systems  All other systems reviewed and are negative.    Physical Exam Updated Vital Signs BP (!) 140/107   Pulse 88   Temp 97.7 F (36.5 C) (Oral)   Resp 20   Ht 1.88 m (6\' 2" )   Wt 105.2 kg (232 lb)   SpO2 100%   BMI 29.79 kg/m   Physical Exam  Constitutional: He is oriented to person, place, and time. He appears well-developed and well-nourished. No distress.  HENT:  Head: Normocephalic and atraumatic.  Right Ear: External ear normal.  Left Ear: External ear normal.  Mouth/Throat: Oropharynx is clear and moist.  Eyes: Conjunctivae are normal. Right eye exhibits no discharge. Left eye exhibits no discharge. No scleral icterus.  Neck: Neck supple. No tracheal deviation present.  Cardiovascular: Normal rate, regular rhythm and intact distal pulses.  Pulmonary/Chest: Effort normal and breath sounds normal. No stridor. No respiratory distress. He has no wheezes. He has no rales.  Abdominal: Soft. Bowel sounds are normal. He exhibits no distension. There is no tenderness. There is no rebound and no  guarding.  Musculoskeletal: He exhibits no edema or tenderness.  Neurological: He is alert and oriented to person, place, and time. He has normal strength. No cranial nerve deficit (No facial droop, extraocular movements intact, tongue midline ) or sensory deficit. He exhibits normal muscle tone. He displays no seizure activity. Coordination normal.  No pronator drift bilateral upper extrem, able to hold both legs off bed for 5 seconds, sensation intact in all extremities, no visual field cuts, no left or right sided neglect, normal finger-nose exam bilaterally, no nystagmus noted   Skin: Skin is warm and dry. No rash noted.  Psychiatric:  He has a normal mood and affect.  Nursing note and vitals reviewed.    ED Treatments / Results  Labs (all labs ordered are listed, but only abnormal results are displayed) Labs Reviewed  CBC - Abnormal; Notable for the following components:      Result Value   WBC 10.9 (*)    RBC 6.07 (*)    MCV 75.8 (*)    MCH 25.5 (*)    All other components within normal limits  DIFFERENTIAL - Abnormal; Notable for the following components:   Neutro Abs 7.8 (*)    Monocytes Absolute 1.5 (*)    All other components within normal limits  COMPREHENSIVE METABOLIC PANEL - Abnormal; Notable for the following components:   Chloride 96 (*)    Glucose, Bld 172 (*)    BUN 30 (*)    Total Protein 8.8 (*)    AST 13 (*)    Anion gap 19 (*)    All other components within normal limits  RAPID URINE DRUG SCREEN, HOSP PERFORMED - Abnormal; Notable for the following components:   Barbiturates   (*)    Value: Result not available. Reagent lot number recalled by manufacturer.   All other components within normal limits  URINALYSIS, ROUTINE W REFLEX MICROSCOPIC - Abnormal; Notable for the following components:   APPearance CLOUDY (*)    Specific Gravity, Urine 1.031 (*)    Glucose, UA >=500 (*)    Hgb urine dipstick LARGE (*)    Ketones, ur 80 (*)    Protein, ur 30 (*)     Leukocytes, UA LARGE (*)    WBC, UA >50 (*)    Bacteria, UA RARE (*)    All other components within normal limits  HEMOGLOBIN A1C - Abnormal; Notable for the following components:   Hgb A1c MFr Bld 6.8 (*)    All other components within normal limits  LIPID PANEL - Abnormal; Notable for the following components:   Cholesterol 238 (*)    HDL 29 (*)    LDL Cholesterol 182 (*)    All other components within normal limits  PROTIME-INR - Abnormal; Notable for the following components:   Prothrombin Time 18.0 (*)    All other components within normal limits  GLUCOSE, CAPILLARY - Abnormal; Notable for the following components:   Glucose-Capillary 173 (*)    All other components within normal limits  GLUCOSE, CAPILLARY - Abnormal; Notable for the following components:   Glucose-Capillary 154 (*)    All other components within normal limits  GLUCOSE, CAPILLARY - Abnormal; Notable for the following components:   Glucose-Capillary 174 (*)    All other components within normal limits  GLUCOSE, CAPILLARY - Abnormal; Notable for the following components:   Glucose-Capillary 183 (*)    All other components within normal limits  GLUCOSE, CAPILLARY - Abnormal; Notable for the following components:   Glucose-Capillary 145 (*)    All other components within normal limits  CBC - Abnormal; Notable for the following components:   RBC 6.14 (*)    MCV 76.7 (*)    MCH 24.1 (*)    All other components within normal limits  BASIC METABOLIC PANEL - Abnormal; Notable for the following components:   Potassium 3.3 (*)    Chloride 95 (*)    Glucose, Bld 148 (*)    All other components within normal limits  GLUCOSE, CAPILLARY - Abnormal; Notable for the following components:   Glucose-Capillary 140 (*)    All other  components within normal limits  GLUCOSE, CAPILLARY - Abnormal; Notable for the following components:   Glucose-Capillary 143 (*)    All other components within normal limits  PROTIME-INR -  Abnormal; Notable for the following components:   Prothrombin Time 21.1 (*)    All other components within normal limits  GLUCOSE, CAPILLARY - Abnormal; Notable for the following components:   Glucose-Capillary 187 (*)    All other components within normal limits  GLUCOSE, CAPILLARY - Abnormal; Notable for the following components:   Glucose-Capillary 168 (*)    All other components within normal limits  CBG MONITORING, ED - Abnormal; Notable for the following components:   Glucose-Capillary 182 (*)    All other components within normal limits  ETHANOL  PROTIME-INR  APTT  HEPARIN LEVEL (UNFRACTIONATED)  HEPARIN LEVEL (UNFRACTIONATED)  I-STAT TROPONIN, ED    EKG EKG Interpretation  Date/Time:  Friday June 14 2018 09:57:13 EDT Ventricular Rate:  89 PR Interval:    QRS Duration: 160 QT Interval:  445 QTC Calculation: 542 R Axis:   -80 Text Interpretation:  Sinus rhythm Consider left atrial enlargement IVCD, consider atypical RBBB Left ventricular hypertrophy Abnrm T, consider ischemia, anterolateral lds No significant change since last tracing Confirmed by Linwood Dibbles [phone]) on 06/14/2018 9:59:08 AM   Radiology Ct Angio Head W Or Wo Contrast  Result Date: 06/14/2018 CLINICAL DATA:  62 y/o M; right leg numbness, dizziness, slurred speech. EXAM: CT ANGIOGRAPHY HEAD AND NECK TECHNIQUE: Multidetector CT imaging of the head and neck was performed using the standard protocol during bolus administration of intravenous contrast. Multiplanar CT image reconstructions and MIPs were obtained to evaluate the vascular anatomy. Carotid stenosis measurements (when applicable) are obtained utilizing NASCET criteria, using the distal internal carotid diameter as the denominator. CONTRAST:  50mL ISOVUE-370 IOPAMIDOL (ISOVUE-370) INJECTION 76% COMPARISON:  06/14/2018 MRI of the head. 12/20/2017 MRI and MRA of the head. FINDINGS: CT HEAD FINDINGS Brain: Small chronic infarct in the left cerebellar  hemisphere. Small areas of lucency in the right medial occipital lobe, right superior cerebellum, and right posterior thalamus corresponding to regions of acute infarction on MRI of the head. No evidence for new intracranial hemorrhage, stroke, or mass effect. No extra-axial collection, hydrocephalus, or herniation. Vascular: As below. Skull: Normal. Negative for fracture or focal lesion. Sinuses: Imaged portions are clear. Orbits: No acute finding. Review of the MIP images confirms the above findings CTA NECK FINDINGS Aortic arch: Bovine variant branching. Imaged portion shows no evidence of aneurysm or dissection. No significant stenosis of the major arch vessel origins. Right carotid system: No evidence of dissection, stenosis (50% or greater) or occlusion. Minimal calcific atherosclerosis without stenosis. Left carotid system: No evidence of dissection, stenosis (50% or greater) or occlusion. Moderate carotid bifurcation mixed plaque without significant stenosis. Vertebral arteries: Left dominant. No evidence of dissection, stenosis (50% or greater) or occlusion. Skeleton: Mild cervical spondylosis. No high-grade bony canal stenosis. Other neck: Negative. Upper chest: Negative. Review of the MIP images confirms the above findings CTA HEAD FINDINGS Anterior circulation: No significant stenosis, proximal occlusion, aneurysm, or vascular malformation. Mild calcific atherosclerosis of carotid siphons. Posterior circulation: Right PCA occlusion at the distal P1 segment (series 10, image 118). No additional occlusion, significant stenosis, or aneurysm in the posterior circulation. Venous sinuses: As permitted by contrast timing, patent. Anatomic variants: Anterior communicating artery and left posterior communicating arteries are present. No right posterior communicating artery identified, likely hypoplastic or absent. Delayed phase: No abnormal intracranial enhancement. Review of  the MIP images confirms the above  findings IMPRESSION: CTA head: 1. Right proximal PCA occlusion at distal P1 segment. Intermediate collateralization of right PCA distribution. 2. No additional intracranial large vessel occlusion. No aneurysm, significant stenosis, or vascular malformation. CTA neck: Patent carotid and vertebral arteries. No high-grade stenosis by NASCET criteria, dissection, or aneurysm. CT head: 1. Areas of hypoattenuation in right thalamus, right medial occipital lobe, and right superior cerebellum corresponding to acute infarcts on MRI of the brain. 2. No new intracranial hemorrhage, stroke, or focal mass effect. These results will be called to the ordering clinician or representative by the Radiologist Assistant, and communication documented in the PACS or zVision Dashboard. Electronically Signed   By: Mitzi Hansen M.D.   On: 06/14/2018 21:38   Dg Chest 2 View  Result Date: 06/14/2018 CLINICAL DATA:  Encounter for CVA. EXAM: CHEST - 2 VIEW COMPARISON:  July 03, 2013 FINDINGS: The heart size and mediastinal contours are within normal limits. Both lungs are clear. The visualized skeletal structures are unremarkable. IMPRESSION: No active cardiopulmonary disease. Electronically Signed   By: Gerome Sam III M.D   On: 06/14/2018 17:20   Ct Angio Neck W Or Wo Contrast  Result Date: 06/14/2018 CLINICAL DATA:  62 y/o M; right leg numbness, dizziness, slurred speech. EXAM: CT ANGIOGRAPHY HEAD AND NECK TECHNIQUE: Multidetector CT imaging of the head and neck was performed using the standard protocol during bolus administration of intravenous contrast. Multiplanar CT image reconstructions and MIPs were obtained to evaluate the vascular anatomy. Carotid stenosis measurements (when applicable) are obtained utilizing NASCET criteria, using the distal internal carotid diameter as the denominator. CONTRAST:  73mL ISOVUE-370 IOPAMIDOL (ISOVUE-370) INJECTION 76% COMPARISON:  06/14/2018 MRI of the head. 12/20/2017 MRI and  MRA of the head. FINDINGS: CT HEAD FINDINGS Brain: Small chronic infarct in the left cerebellar hemisphere. Small areas of lucency in the right medial occipital lobe, right superior cerebellum, and right posterior thalamus corresponding to regions of acute infarction on MRI of the head. No evidence for new intracranial hemorrhage, stroke, or mass effect. No extra-axial collection, hydrocephalus, or herniation. Vascular: As below. Skull: Normal. Negative for fracture or focal lesion. Sinuses: Imaged portions are clear. Orbits: No acute finding. Review of the MIP images confirms the above findings CTA NECK FINDINGS Aortic arch: Bovine variant branching. Imaged portion shows no evidence of aneurysm or dissection. No significant stenosis of the major arch vessel origins. Right carotid system: No evidence of dissection, stenosis (50% or greater) or occlusion. Minimal calcific atherosclerosis without stenosis. Left carotid system: No evidence of dissection, stenosis (50% or greater) or occlusion. Moderate carotid bifurcation mixed plaque without significant stenosis. Vertebral arteries: Left dominant. No evidence of dissection, stenosis (50% or greater) or occlusion. Skeleton: Mild cervical spondylosis. No high-grade bony canal stenosis. Other neck: Negative. Upper chest: Negative. Review of the MIP images confirms the above findings CTA HEAD FINDINGS Anterior circulation: No significant stenosis, proximal occlusion, aneurysm, or vascular malformation. Mild calcific atherosclerosis of carotid siphons. Posterior circulation: Right PCA occlusion at the distal P1 segment (series 10, image 118). No additional occlusion, significant stenosis, or aneurysm in the posterior circulation. Venous sinuses: As permitted by contrast timing, patent. Anatomic variants: Anterior communicating artery and left posterior communicating arteries are present. No right posterior communicating artery identified, likely hypoplastic or absent.  Delayed phase: No abnormal intracranial enhancement. Review of the MIP images confirms the above findings IMPRESSION: CTA head: 1. Right proximal PCA occlusion at distal P1 segment. Intermediate collateralization of right  PCA distribution. 2. No additional intracranial large vessel occlusion. No aneurysm, significant stenosis, or vascular malformation. CTA neck: Patent carotid and vertebral arteries. No high-grade stenosis by NASCET criteria, dissection, or aneurysm. CT head: 1. Areas of hypoattenuation in right thalamus, right medial occipital lobe, and right superior cerebellum corresponding to acute infarcts on MRI of the brain. 2. No new intracranial hemorrhage, stroke, or focal mass effect. These results will be called to the ordering clinician or representative by the Radiologist Assistant, and communication documented in the PACS or zVision Dashboard. Electronically Signed   By: Mitzi Hansen M.D.   On: 06/14/2018 21:38   Dg Abd Portable 2v  Result Date: 06/15/2018 CLINICAL DATA:  Nausea and vomiting. EXAM: PORTABLE ABDOMEN - 2 VIEW COMPARISON:  07/03/2013 FINDINGS: The bowel gas pattern is normal. No obstruction or significant ileus identified. There is no evidence of free air. No radio-opaque calculi or other significant radiographic abnormality is seen. The lumbar spine demonstrates degenerative disc disease. IMPRESSION: Negative. Electronically Signed   By: Irish Lack M.D.   On: 06/15/2018 15:09    Procedures Procedures (including critical care time)  Medications Ordered in ED Medications - No data to display   Initial Impression / Assessment and Plan / ED Course  I have reviewed the triage vital signs and the nursing notes.  Pertinent labs & imaging results that were available during my care of the patient were reviewed by me and considered in my medical decision making (see chart for details).  Clinical Course as of Jun 16 1650  Fri Jun 14, 2018  1010 Pt with  possible tia,stroke sx.   No appreciable deficits on exam.  Will proceed with work up.  No code stroke right now, no indication for tpa based on my exam   [JK]  1143 CT findings noted earlier indicating possible occipital stroke.  I will consult with neurology   [JK]  1154 I updated patient on the findings.  He still having some dizziness.  He finds that when he opens his eyes and looks around he feels his symptoms.   [JK]  1159 Discussed with Dr Amada Jupiter.  Would recommend MRI for further evaluation.  Admit to [facility].   [JK]    Clinical Course User Index [JK] Linwood Dibbles, MD     Final Clinical Impressions(s) / ED Diagnoses   Final diagnoses:  Cerebral infarction, unspecified mechanism [facility])      Linwood Dibbles, MD 06/16/18 1652

## 2018-06-14 NOTE — ED Triage Notes (Signed)
Pt is accompanied with dtr who is c/o RT leg numbness and slurred speech that started this morning. Pt reports that he was unable to walk d/t feeling off balance. Pt does have hx of stroke in the past . Pt reports that he was seen here 3 days ago for dehydration s/p prostates surgery on July 3.

## 2018-06-14 NOTE — Progress Notes (Signed)
Patient was eating his dinner and felt as if the solid foods were not going down very well.m He did not have any trouble wit thin liquids. The family was very concern he could not swallow the meat. MD notified. She told me to put him on a full liquid diet.

## 2018-06-14 NOTE — Progress Notes (Addendum)
Patient arrived to the unit alert and oriented X 4 Denies pain. C/o some blurry vision but can tel which eye. He said sometimes when he closes his eyes and open them his vision is blurred.  He arrived to the unit with a foley due to a recent procedure (TURP)  All questions and concerns addressed. Bed in the lowest position alarm set. Call light in reach.

## 2018-06-14 NOTE — Progress Notes (Addendum)
ANTICOAGULATION CONSULT NOTE - Initial Consult  Pharmacy Consult for warfarin Indication: hx CVA  Allergies  Allergen Reactions  . Lipitor [Atorvastatin] Nausea And Vomiting    Patient Measurements: Height: 6\' 2"  (188 cm) Weight: 232 lb (105.2 kg) IBW/kg (Calculated) : 82.2  Vital Signs: Temp: 97.7 F (36.5 C) (07/12 1415) Temp Source: Oral (07/12 1358) BP: 131/93 (07/12 1358) Pulse Rate: 95 (07/12 1358)  Labs: Recent Labs    06/11/18 1606 06/14/18 1018  HGB 14.9 15.5  HCT 44.2 46.0  PLT 398 387  APTT  --  26  LABPROT  --  14.7  INR  --  1.16  CREATININE 1.10 1.21    Estimated Creatinine Clearance: 80.8 mL/min (by C-G formula based on SCr of 1.21 mg/dL).   Medical History: Past Medical History:  Diagnosis Date  . Coronary artery disease    blockage   Stent Dr. Ladona Ridgel 13 years 1998  . Diabetes mellitus (HCC) 07/2014 dx   type 2  . Dyspnea   . Hypertension    Assessment: Pharmacy consulted to continue warfarin in this 62 year old male who was taking PTA for hx CVA. INR subtherapeutic on admission - suspect issues with medication compliance.   Today, 06/14/18  INR 1.16 is subtherapeutic  Goal of Therapy:  INR 2-3 Monitor platelets by anticoagulation protocol: Yes   Plan:  Warfarin 10 mg PO once INR daily  Jodelle Red Quinette Hentges 06/14/2018,2:39 PM

## 2018-06-14 NOTE — H&P (Signed)
[facility] History and Physical  Karl [name] QAE:[id number] DOB: 06/08/1956 DOA: 06/14/2018  PCP: Pincus Sanes, MD  Patient coming from: Home  Chief Complaint: Dizziness  HPI: Karl [name] is a 62 y.o. male with a medical history of Neri disease, diabetes mellitus, hypertension, history of stroke, BPH, who presented to the emergency department with complaints of dizziness and being unsteady on his feet.  Patient had gone to the bathroom this morning and on his way he felt dizzy and had to hold onto something.  He also had some right leg numbness.  Patient also feels dizzy and feels that the room is spinning and has been seeing double images.  Patient does also endorse a poor appetite.  Currently patient denies chest pain, shortness of breath, abdominal pain, nausea or vomiting, diarrhea or constipation, changes in urination or bowel pattern, recent illness or travel.  ED Course: Found to have acute CVA on CT head.  MRI brain ordered.  Neurology consulted, [facility] called for admission.  Review of Systems:  All other systems reviewed and are negative.   Past Medical History:  Diagnosis Date  . Coronary artery disease    blockage   Stent Dr. Ladona Ridgel 15 years 1998  . Diabetes mellitus (HCC) 07/2014 dx   type 2  . Dyspnea   . Hypertension     Past Surgical History:  Procedure Laterality Date  . CORONARY STENT PLACEMENT  1998  . GREEN LIGHT LASER TURP (TRANSURETHRAL RESECTION OF PROSTATE N/A 06/05/2018   Procedure: GREEN LIGHT LASER TURP , TRANSURETHRAL RESECTION OF PROSTATE;  Surgeon: Crista Elliot, MD;  Location: [facility];  Service: Urology;  Laterality: N/A;  . NO PAST SURGERIES      Social History:  reports that he quit smoking about 17 years ago. His smoking use included cigarettes. He has a 10.00 pack-year smoking history. He has never used smokeless tobacco. He reports that he drank alcohol. He reports that he does not use drugs.  Allergies  Allergen Reactions  .  Lipitor [Atorvastatin] Nausea And Vomiting    Family History  Problem Relation Age of Onset  . Diabetes Mother   . Diabetes Father   . Stroke Father 77  . Diabetes Sister   . Clotting disorder Sister 73  . Diabetes Other        nephew  . Diabetes Brother   . Diabetes Maternal Grandmother     Prior to Admission medications   Medication Sig Start Date End Date Taking? Authorizing Provider  carvedilol (COREG) 12.5 MG tablet TAKE 1 & 1/2 (ONE & ONE-HALF) TABLETS BY MOUTH TWICE DAILY 10/16/17  Yes Marinus Maw, MD  cephALEXin (KEFLEX) 500 MG capsule Take 1 capsule (500 mg total) by mouth 3 (three) times daily for 5 days. 06/11/18 06/16/18 Yes Dartha Lodge, PA-C  dapagliflozin propanediol (FARXIGA) 5 MG TABS tablet Take 5 mg by mouth daily. 02/06/18  Yes Burns, Bobette Mo, MD  enalapril (VASOTEC) 5 MG tablet TAKE 1 TABLET BY MOUTH TWICE DAILY Patient taking differently: TAKE 1 TABLET (5mg ) BY MOUTH TWICE DAILY 05/06/18  Yes Marinus Maw, MD  finasteride (PROSCAR) 5 MG tablet Take 5 mg by mouth daily. 04/08/18  Yes [provider]  HYDROcodone-acetaminophen (NORCO/VICODIN) 5-325 MG tablet Take 1 tablet by mouth every 4 (four) hours as needed for moderate pain. 06/05/18 06/05/19 Yes Ray Church III, MD  metFORMIN (GLUCOPHAGE) 1000 MG tablet TAKE 1 TABLET BY MOUTH TWICE DAILY WITH A MEAL  Patient taking differently: TAKE 1 TABLET (1000mg ) BY MOUTH TWICE DAILY WITH A MEAL 06/03/18  Yes Burns, Bobette Mo, MD  ondansetron (ZOFRAN ODT) 4 MG disintegrating tablet 4mg  ODT q4 hours prn nausea/vomit Patient taking differently: Take 4 mg by mouth every 4 (four) hours as needed for nausea or vomiting. 4mg  ODT q4 hours prn nausea/vomit 06/11/18  Yes Dartha Lodge, PA-C  tamsulosin (FLOMAX) 0.4 MG CAPS capsule Take 1 capsule (0.4 mg total) by mouth daily after supper. 02/06/18  Yes Burns, Bobette Mo, MD  warfarin (COUMADIN) 10 MG tablet Take 10 mg by mouth daily.   Yes [provider]    Physical  Exam: Vitals:   06/14/18 1003 06/14/18 1129  BP: (!) 140/107 (!) 128/99  Pulse:  85  Resp:  (!) 22  Temp:  (!) 97.4 F (36.3 C)  SpO2:  100%     General: Well developed, well nourished, NAD, appears stated age  HEENT: NCAT, PERRLA, EOMI, Anicteic Sclera, mucous membranes moist.  Poor dentition  Neck: Supple, no JVD, no masses  Cardiovascular: S1 S2 auscultated, no rubs, murmurs or gallops. Regular rate and rhythm.  Respiratory: Clear to auscultation bilaterally with equal chest rise  Abdomen: Soft, nontender, nondistended, + bowel sounds  Extremities: warm dry without cyanosis clubbing or edema  Neuro: AAOx3, cranial nerves grossly intact.  Horizontal left gaze nystagmus. Strength 5/5 in patient's upper and lower extremities bilaterally.  Intact finger-nose exam and heel-to-shin exams  Skin: Without rashes exudates or nodules  Psych: Normal affect and demeanor with intact judgement and insight  Labs on Admission: I have personally reviewed following labs and imaging studies CBC: Recent Labs  Lab 06/11/18 1606 06/14/18 1018  WBC 11.0* 10.9*  NEUTROABS  --  7.8*  HGB 14.9 15.5  HCT 44.2 46.0  MCV 75.8* 75.8*  PLT 398 387   Basic Metabolic Panel: Recent Labs  Lab 06/11/18 1606 06/14/18 1018  NA 139 138  K 4.3 4.1  CL 101 96*  CO2 21* 23  GLUCOSE 166* 172*  BUN 21 30*  CREATININE 1.10 1.21  CALCIUM 10.0 9.7   GFR: Estimated Creatinine Clearance: 80.8 mL/min (by C-G formula based on SCr of 1.21 mg/dL). Liver Function Tests: Recent Labs  Lab 06/11/18 1606 06/14/18 1018  AST 15 13*  ALT 21 17  ALKPHOS 68 65  BILITOT 0.9 1.2  PROT 9.2* 8.8*  ALBUMIN 3.7 3.7   Recent Labs  Lab 06/11/18 1606  LIPASE 20   No results for input(s): AMMONIA in the last 168 hours. Coagulation Profile: Recent Labs  Lab 06/14/18 1018  INR 1.16   Cardiac Enzymes: No results for input(s): CKTOTAL, CKMB, CKMBINDEX, TROPONINI in the last 168 hours. BNP (last 3  results) No results for input(s): PROBNP in the last 8760 hours. HbA1C: No results for input(s): HGBA1C in the last 72 hours. CBG: Recent Labs  Lab 06/11/18 1706 06/14/18 1003  GLUCAP 146* 182*   Lipid Profile: No results for input(s): CHOL, HDL, LDLCALC, TRIG, CHOLHDL, LDLDIRECT in the last 72 hours. Thyroid Function Tests: No results for input(s): TSH, T4TOTAL, FREET4, T3FREE, THYROIDAB in the last 72 hours. Anemia Panel: No results for input(s): VITAMINB12, FOLATE, FERRITIN, TIBC, IRON, RETICCTPCT in the last 72 hours. Urine analysis:    Component Value Date/Time   COLORURINE YELLOW 06/11/2018 1559   APPEARANCEUR HAZY (A) 06/11/2018 1559   LABSPEC 1.029 06/11/2018 1559   PHURINE 5.0 06/11/2018 1559   GLUCOSEU >=500 (A) 06/11/2018 1559   HGBUR  LARGE (A) 06/11/2018 1559   BILIRUBINUR NEGATIVE 06/11/2018 1559   BILIRUBINUR moderate 07/09/2014 1428   KETONESUR 80 (A) 06/11/2018 1559   PROTEINUR 100 (A) 06/11/2018 1559   UROBILINOGEN 0.2 08/07/2015 0854   NITRITE POSITIVE (A) 06/11/2018 1559   LEUKOCYTESUR LARGE (A) 06/11/2018 1559   Sepsis Labs: @LABRCNTIP (procalcitonin:4,lacticidven:4) ) Recent Results (from the past 240 hour(s))  Urine culture     Status: Abnormal   Collection Time: 06/11/18  3:59 PM  Result Value Ref Range Status   Specimen Description   Final    URINE, CATHETERIZED Performed at [facility], [name], [place], [place] [address]    Special Requests   Final    NONE Performed at [facility], [address], [place], [place] [address]    Culture (A)  Final    >=100,000 COLONIES/mL KLEBSIELLA PNEUMONIAE >=100,000 COLONIES/mL ENTEROCOCCUS FAECALIS    Report Status 06/14/2018 FINAL  Final   Organism ID, Bacteria KLEBSIELLA PNEUMONIAE (A)  Final   Organism ID, Bacteria ENTEROCOCCUS FAECALIS (A)  Final      Susceptibility   Enterococcus faecalis - MIC*    AMPICILLIN <=2 SENSITIVE Sensitive      LEVOFLOXACIN 1 SENSITIVE Sensitive     NITROFURANTOIN <=16 SENSITIVE Sensitive     VANCOMYCIN 1 SENSITIVE Sensitive     * >=100,000 COLONIES/mL ENTEROCOCCUS FAECALIS   Klebsiella pneumoniae - MIC*    AMPICILLIN RESISTANT Resistant     CEFAZOLIN <=4 SENSITIVE Sensitive     CEFTRIAXONE <=1 SENSITIVE Sensitive     CIPROFLOXACIN <=0.25 SENSITIVE Sensitive     GENTAMICIN <=1 SENSITIVE Sensitive     IMIPENEM <=0.25 SENSITIVE Sensitive     NITROFURANTOIN <=16 SENSITIVE Sensitive     TRIMETH/SULFA <=20 SENSITIVE Sensitive     AMPICILLIN/SULBACTAM 4 SENSITIVE Sensitive     PIP/TAZO <=4 SENSITIVE Sensitive     Extended ESBL NEGATIVE Sensitive     * >=100,000 COLONIES/mL KLEBSIELLA PNEUMONIAE     Radiological Exams on Admission: Ct Head Wo Contrast  Result Date: 06/14/2018 CLINICAL DATA:  TIA. EXAM: CT HEAD WITHOUT CONTRAST TECHNIQUE: Contiguous axial images were obtained from the base of the skull through the vertex without intravenous contrast. COMPARISON:  MRI head 12/20/2017, CT head 12/19/2017 FINDINGS: Brain: Ill-defined hypodensity right occipital lobe compatible with acute infarct. This was not present previously. No associated hemorrhage Mild atrophy. Mild chronic microvascular ischemia in the white matter. Small chronic infarcts in the cerebellum bilaterally. Vascular: Negative for hyperdense vessel Skull: Negative Sinuses/Orbits: Negative Other: None IMPRESSION: Hypodensity right occipital lobe compatible with acute infarct. Negative for hemorrhage. These results were called by telephone at the time of interpretation on 06/14/2018 at 11:03 am to Dr. Linwood Dibbles , who verbally acknowledged these results. Electronically Signed   By: Marlan Palau M.D.   On: 06/14/2018 11:03    EKG: Independently reviewed.  His rhythm, rate 89, right bundle  Assessment/Plan Acute CVA -Patient presented with right leg numbness as well as dizziness and unsteady balance -CT head: Hypodensity right occipital  lobe compatible with acute infarct. -Pending MRI brain, carotid Doppler, echocardiogram, fasting lipid panel, hemoglobin A1c -Patient will be [facility] -Neurology consulted and appreciated -PT, OT, speech consulted -Continue Coumadin per pharmacy, currently INR 1.16 (subtherapeutic)  Essential hypertension -Will hold Coreg, enalapril to allow for permissive hypertension in the setting of acute CVA  Diabetes mellitus, type II -Hold Farxiga and metfromin -Placed on insulin sliding scale with CBG monitoring  History of  coronary artery disease -Currently chest pain-free -Coreg and enalapril currently held  History of BPH and urinary retention -Status post greenlight laser TURP, transurethral resection of prostate on 06/05/2018 -Continue Flomax, Proscar -Patient was placed on Keflex, completion of 5-day course on 06/15/2018  Chronic systolic and diastolic CHF -Echocardiogram 12/20/2017 showed EF of 20 to 25%, grade 1 diastolic dysfunction -Patient currently appears to be euvolemic and compensated -Monitor intake and output, daily weights -Patient follows with Dr. Mena Pauls has supposedly refused ICD insertion in the past  DVT prophylaxis: Coumadin  Code Status: Full  Family Communication: Daughter at bedside. Admission, patients condition and plan of care including tests being ordered have been discussed with the patient and daughter who indicate understanding and agree with the plan and Code Status.  Disposition Plan: Home  Consults called: Neurology   Admission status: Inpatient, [place]   Time spent: 70 minutes  [employer] D[employer] [facility] Pager [phone]  If 7PM-7AM, please contact night-coverage [email or web address] Password [email or web address] 06/14/2018, 12:37 PM

## 2018-06-14 NOTE — Consult Note (Signed)
Neurology Consultation Reason for Consult: Stroke Referring Physician: Nunzio Cory  CC: Dizziness  History is obtained from: Patient  HPI: Karl [name] is a 62 y.o. male with a history of previous posterior circulation stroke who woke feeling relatively normal and walked to the bathroom where he had an onset of dizziness.  He did not notice any changes in his vision.  In fact he denies any problems with his vision.  The dizziness improved.  He went to [facility] long ER where an MRI was performed which demonstrates multifocal infarct in the posterior circulation.   LKW: Unclear, likely prior to bed 7/11 tpa given?: no, unclear time of onset    ROS: A 14 point ROS was performed and is negative except as noted in the HPI.    Past Medical History:  Diagnosis Date  . Acute CVA (cerebrovascular accident) (HCC) 06/14/2018  . Coronary artery disease    blockage   Stent Dr. Ladona Ridgel 15 years 1998  . Dyspnea   . Hypertension   . Stroke [facility]) 2019   denies residual on 06/14/2018  . Type II diabetes mellitus (HCC) 07/2014 dx     Family History  Problem Relation Age of Onset  . Diabetes Mother   . Diabetes Father   . Stroke Father 80  . Diabetes Sister   . Clotting disorder Sister 47  . Diabetes Other        nephew  . Diabetes Brother   . Diabetes Maternal Grandmother      Social History:  reports that he quit smoking about 17 years ago. His smoking use included cigarettes. He has a 10.00 pack-year smoking history. He has never used smokeless tobacco. He reports that he drinks alcohol. He reports that he has current or past drug history.   Exam: Current vital signs: BP (!) 130/104 (BP Location: Right Arm)   Pulse 93   Temp 97.8 F (36.6 C)   Resp 15   Ht 6\' 2"  (1.88 m)   Wt 105.2 kg (232 lb)   SpO2 100%   BMI 29.79 kg/m  Vital signs in last 24 hours: Temp:  [97.4 F (36.3 C)-97.9 F (36.6 C)] 97.8 F (36.6 C) (07/12 1800) Pulse Rate:  [83-95] 93 (07/12 1544) Resp:   [15-22] 15 (07/12 1544) BP: (128-182)/(91-107) 130/104 (07/12 1800) SpO2:  [99 %-100 %] 100 % (07/12 1544) Weight:  [105.2 kg (232 lb)] 105.2 kg (232 lb) (07/12 0958)   Physical Exam  Constitutional: Appears well-developed and well-nourished.  Psych: Affect appropriate to situation Eyes: No scleral injection HENT: No OP obstrucion Head: Normocephalic.  Cardiovascular: Normal rate and regular rhythm.  Respiratory: Effort normal, non-labored breathing GI: Soft.  No distension. There is no tenderness.  Skin: WDI  Neuro: Mental Status: Patient is awake, alert, oriented to person, place, month, year, and situation. Patient is able to give a clear and coherent history. No signs of aphasia or neglect Cranial Nerves: II: Left upper quadrantanopia. Pupils are equal, round, and reactive to light.   III,IV, VI: EOMI without ptosis or diploplia.  V: Facial sensation is symmetric to temperature VII: Facial movement is symmetric.  VIII: hearing is intact to voice X: Uvula elevates symmetrically XI: Shoulder shrug is symmetric. XII: tongue is midline without atrophy or fasciculations.  Motor: Tone is normal. Bulk is normal. 5/5 strength was present in all four extremities.  His movements are slow in his left upper extremity Sensory: Sensation is symmetric to light touch and temperature in the arms  and legs. Cerebellar: Slow finger-nose-finger on the left, intact on the right.  I have reviewed labs in epic and the results pertinent to this consultation are: Normal creatinine  I have reviewed the images obtained: MRI brain-multifocal infarcts in the posterior circulation   Impression: 62 year old male with multifocal posterior cervical lesion infarcts.  The presence of old infarcts in the same distribution would raise the possibility of artery to artery embolization and we need to do imaging of the posterior circulation.  I suspect that the cerebellar infarct with symptomatic lesion, given  that he was unaware of his visual deficit, unclear what the actual time of onset was and therefore he did not receive IV TPA.  His low EF was thought to be contribute to his previous stroke and he was started on Coumadin, he stopped the Coumadin for the procedure but has been back on it for 10 days and though his INR is currently subtherapeutic.  He will need secondary risk factor modification and therapy.  Recommendations: - HgbA1c, fasting lipid panel - MRI, MRA  of the brain without contrast - Frequent neuro checks - Echocardiogram - Carotid dopplers - Prophylactic therapy-Antiplatelet med: Aspirin - dose 325mg  PO or 300mg  PR - Risk factor modification - Telemetry monitoring - PT consult, OT consult, Speech consult - Stroke team to follow  Ritta Slot, MD [facility] [phone]  If 7pm- 7am, please page neurology on call as listed in AMION.

## 2018-06-15 ENCOUNTER — Telehealth: Payer: Self-pay

## 2018-06-15 ENCOUNTER — Inpatient Hospital Stay (HOSPITAL_COMMUNITY): Payer: Managed Care, Other (non HMO)

## 2018-06-15 DIAGNOSIS — I5022 Chronic systolic (congestive) heart failure: Secondary | ICD-10-CM

## 2018-06-15 DIAGNOSIS — I6340 Cerebral infarction due to embolism of unspecified cerebral artery: Principal | ICD-10-CM

## 2018-06-15 DIAGNOSIS — I340 Nonrheumatic mitral (valve) insufficiency: Secondary | ICD-10-CM

## 2018-06-15 LAB — GLUCOSE, CAPILLARY
GLUCOSE-CAPILLARY: 145 mg/dL — AB (ref 70–99)
Glucose-Capillary: 174 mg/dL — ABNORMAL HIGH (ref 70–99)
Glucose-Capillary: 183 mg/dL — ABNORMAL HIGH (ref 70–99)
Glucose-Capillary: 140 mg/dL — ABNORMAL HIGH (ref 70–99)

## 2018-06-15 LAB — PROTIME-INR
INR: 1.5
PROTHROMBIN TIME: 18 s — AB (ref 11.4–15.2)

## 2018-06-15 LAB — LIPID PANEL
CHOL/HDL RATIO: 8.2 ratio
CHOLESTEROL: 238 mg/dL — AB (ref 0–200)
HDL: 29 mg/dL — AB (ref >40)
LDL Cholesterol: 182 mg/dL — ABNORMAL HIGH (ref 0–99)
Triglycerides: 137 mg/dL (ref <150)
VLDL: 27 mg/dL (ref 0–40)

## 2018-06-15 LAB — HEMOGLOBIN A1C
Hgb A1c MFr Bld: 6.8 % — ABNORMAL HIGH (ref 4.8–5.6)
Mean Plasma Glucose: 148.46 mg/dL

## 2018-06-15 LAB — ECHOCARDIOGRAM COMPLETE
Height: 74 in
WEIGHTICAEL: 3712 [oz_av]

## 2018-06-15 MED ORDER — POLYETHYLENE GLYCOL 3350 17 G PO PACK
17.0000 g | PACK | Freq: Every day | ORAL | Status: DC
  Administered 2018-06-15 – 2018-06-18 (×4): 17 g via ORAL
  Filled 2018-06-15 (×4): qty 1

## 2018-06-15 MED ORDER — WARFARIN SODIUM 5 MG PO TABS
10.0000 mg | ORAL_TABLET | Freq: Once | ORAL | Status: AC
  Administered 2018-06-15: 10 mg via ORAL
  Filled 2018-06-15: qty 2

## 2018-06-15 MED ORDER — PROMETHAZINE HCL 25 MG/ML IJ SOLN: 12.5000 mg | Freq: Four times a day (QID) | INTRAMUSCULAR | Status: DC | PRN

## 2018-06-15 MED ORDER — PERFLUTREN LIPID MICROSPHERE
1.0000 mL | INTRAVENOUS | Status: AC | PRN
  Administered 2018-06-15: 2 mL via INTRAVENOUS
  Filled 2018-06-15: qty 10

## 2018-06-15 MED ORDER — ASPIRIN 325 MG PO TABS
325.0000 mg | ORAL_TABLET | Freq: Every day | ORAL | Status: DC
  Administered 2018-06-15 – 2018-06-16 (×2): 325 mg via ORAL
  Filled 2018-06-15 (×2): qty 1

## 2018-06-15 MED ORDER — FLEET ENEMA 7-19 GM/118ML RE ENEM
1.0000 | ENEMA | Freq: Every day | RECTAL | Status: DC | PRN
Start: 2018-06-15 — End: 2018-06-18
  Administered 2018-06-17: 1 via RECTAL
  Filled 2018-06-15 (×2): qty 1

## 2018-06-15 NOTE — Progress Notes (Signed)
STROKE TEAM PROGRESS NOTE   SUBJECTIVE (INTERVAL HISTORY) No family is at the bedside. Pt is lying in bed, mildly lethargic but following all commands. He stated that he had procedure of his prostate on 06/05/18 and discharged the second day. He was kept in lovenox shot after discharge and then resumed coumadin after stopping the lovenox. As per discharge note, he "will continue lovenox until Monday and then resume his coumadin". However, his INR 06/14/18 was 1.16 only.    OBJECTIVE Temp:  [97.4 F (36.3 C)-97.9 F (36.6 C)] 97.8 F (36.6 C) (07/13 0344) Pulse Rate:  [80-95] 80 (07/13 0344) Cardiac Rhythm: Normal sinus rhythm;Bundle branch block (07/13 0700) Resp:  [0-22] 21 (07/13 0344) BP: (119-182)/(77-114) 170/102 (07/13 0700) SpO2:  [94 %-100 %] 100 % (07/13 0344) Weight:  [232 lb (105.2 kg)] 232 lb (105.2 kg) (07/12 0958)  CBC:  Recent Labs  Lab 06/11/18 1606 06/14/18 1018  WBC 11.0* 10.9*  NEUTROABS  --  7.8*  HGB 14.9 15.5  HCT 44.2 46.0  MCV 75.8* 75.8*  PLT 398 387    Basic Metabolic Panel:  Recent Labs  Lab 06/11/18 1606 06/14/18 1018  NA 139 138  K 4.3 4.1  CL 101 96*  CO2 21* 23  GLUCOSE 166* 172*  BUN 21 30*  CREATININE 1.10 1.21  CALCIUM 10.0 9.7    Lipid Panel:     Component Value Date/Time   CHOL 238 (H) 06/15/2018 0356   TRIG 137 06/15/2018 0356   HDL 29 (L) 06/15/2018 0356   CHOLHDL 8.2 06/15/2018 0356   VLDL 27 06/15/2018 0356   LDLCALC 182 (H) 06/15/2018 0356   HgbA1c:  Lab Results  Component Value Date   HGBA1C 6.8 (H) 06/15/2018   Urine Drug Screen:     Component Value Date/Time   LABOPIA NONE DETECTED 06/14/2018 1727   COCAINSCRNUR NONE DETECTED 06/14/2018 1727   LABBENZ NONE DETECTED 06/14/2018 1727   AMPHETMU NONE DETECTED 06/14/2018 1727   THCU NONE DETECTED 06/14/2018 1727   LABBARB (A) 06/14/2018 1727    Result not available. Reagent lot number recalled by manufacturer.    Alcohol Level     Component Value Date/Time    ETH <10 06/14/2018 1018    IMAGING I have personally reviewed the radiological images below and agree with the radiology interpretations.  Ct Angio Head W Or Wo Contrast Ct Angio Neck W Or Wo Contrast 06/14/2018 IMPRESSION:   CTA head:  1. Right proximal PCA occlusion at distal P1 segment. Intermediate collateralization of right PCA distribution.  2. No additional intracranial large vessel occlusion. No aneurysm, significant stenosis, or vascular malformation.   CTA neck:  Patent carotid and vertebral arteries. No high-grade stenosis by NASCET criteria, dissection, or aneurysm.   CT head:  1. Areas of hypoattenuation in right thalamus, right medial occipital lobe, and right superior cerebellum corresponding to acute infarcts on MRI of the brain.  2. No new intracranial hemorrhage, stroke, or focal mass effect.   Ct Head Wo Contrast 06/14/2018 IMPRESSION:  Hypodensity right occipital lobe compatible with acute infarct. Negative for hemorrhage.   Mr Brain Wo Contrast 06/14/2018 IMPRESSION:  1. Acute infarcts in both the Right PCA and Right SCA territories with no associated hemorrhage or mass effect.  2. Superimposed punctate acute infarct also in Left PCA territory white matter.  3. This pattern might indicate a recent embolic shower to the posterior circulation, however, there is an underlying chronic Left PICA territory infarct.   Transthoracic Echocardiogram -  Left ventricle: The cavity size was moderately dilated. Wall   thickness was normal. The estimated ejection fraction was 20%.   Diffuse hypokinesis. There is akinesis of the anteroseptal and   anterior myocardium. Indeterminate diastolic function. Definity   contrast shows swirling contrast and loosely organized thrombotic   material at the LV apex. - Aortic valve: Mildly calcified annulus. Trileaflet. - Mitral valve: There was mild regurgitation. - Right atrium: Central venous pressure (est): 3 mm Hg. - Atrial  septum: No defect or patent foramen ovale was identified. - Tricuspid valve: There was trivial regurgitation. - Pulmonary arteries: Systolic pressure could not be accurately   estimated. - Pericardium, extracardiac: There was no pericardial effusion.    PHYSICAL EXAM  Temp:  [97.7 F (36.5 C)-97.9 F (36.6 C)] 97.8 F (36.6 C) (07/13 1049) Pulse Rate:  [80-95] 86 (07/13 0946) Resp:  [0-22] 17 (07/13 0946) BP: (119-170)/(77-114) 151/99 (07/13 0946) SpO2:  [94 %-100 %] 96 % (07/13 0946)  General - Well nourished, well developed, in no apparent distress, mild lethargy.  Ophthalmologic - fundi not visualized due to noncooperation.  Cardiovascular - Regular rate and rhythm.  Mental Status -  Level of arousal and orientation to time, place, and person were intact. Mild lethargy Language including expression, naming, repetition, comprehension was assessed and found intact.  Cranial Nerves II - XII - II - left upper quadrantanopia, left lower quadrant decreased visual acuity with simutagnosia. III, IV, VI - Extraocular movements intact. V - Facial sensation intact bilaterally. VII - left nasolabial fold flattening. VIII - Hearing & vestibular intact bilaterally. X - Palate elevates symmetrically. XI - Chin turning & shoulder shrug intact bilaterally. XII - Tongue protrusion intact.  Motor Strength - The patient's strength was normal in RUE and RLE, LUE 3/5 deltoid, 4/5 bicep and tricep, 5-/5 hand grip, LLE 4/5 proximal and 5/5 distal and pronator drift was present on the left.  Bulk was normal and fasciculations were absent.   Motor Tone - Muscle tone was assessed at the neck and appendages and was normal.  Reflexes - The patient's reflexes were symmetrical in all extremities and he had no pathological reflexes.  Sensory - Light touch, temperature/pinprick were assessed and were symmetrical.    Coordination - The patient had normal movements in right hand and right foot with no  ataxia or dysmetria.  Tremor was absent.  Gait and Station - deferred.   ASSESSMENT/PLAN [name] is a 62 y.o. male with history of a previous CVA, coronary artery disease with stent, hypertension, chronic CHF with ejection fraction of 20 to 25% on warfarin therapy (but stopped due to recent surgery, INR 1.16 on admission) and diabetes mellitus presenting with dizziness. He did not receive IV t-PA due to unclear time of onset.  Stroke: Multiple infarcts including right PCA, right SCA, left parieto-occipital punctate infarcts, consistent with an embolic source - likely low ejection fraction with subtherapeutic INR  Resultant left hemianopia, left hemiparesis  CT head - Hypodensity right occipital lobe compatible with acute infarct.  MRI head - Acute infarcts in both the Right PCA and Right SCA territories. Superimposed punctate acute infarct also in Left PCA territory white matter.  CTA H&N - right P1 occlusion with intermittent collateralization   2D Echo - EF 20% with possible apical thrombus  LDL - 182  HgbA1c - 6.8  VTE prophylaxis - warfarin  warfarin daily prior to admission, now on warfarin daily. Will need heparin IV bridge. Once INR 2-3, heparin  IV can be stopped.  Patient counseled to be compliant with his antithrombotic medications  Ongoing aggressive stroke risk factor management  Therapy recommendations:  CIR  Disposition:  Pending  Cardiomyopathy, chronic  12/2017 EF 20-25%  Put on coumadin with INR goal 2-3  Following with Dr. Ladona Ridgel   This time repeat EF 20%  INR subtherapeutic, will need heparin IV bridge.   Hx of stroke  12/2017 right CR infarct and MRA showed right M2 high grade stenosis  CUS neg, EF 20-25%, LDL 88 and A1C 6.5. UDS neg.   Put on ASA and plavix  12/28/17 30 day cardiac event monitoring neg for afib.  Hypertension  Stable . Permissive hypertension (OK if < 220/120) but gradually normalize in 5-7 days . Long-term BP  goal normotensive  Hyperlipidemia  Lipid lowering medication PTA: none  LDL 182, goal < 70  Current lipid lowering medication: crestor 40  Continue statin at discharge  Diabetes  HgbA1c 6.8, goal < 7.0  Controlled  SSI  CBG monitoring  Other Stroke Risk Factors  Advanced age  Former cigarette smoker - quit  ETOH use, advised to drink no more than 1 alcoholic beverage per day.  Family hx of stroke (father)  Coronary artery disease  Other Active Problems  Elevated TG   Hospital day # 1  Marvel Plan, MD PhD Stroke Neurology 06/16/2018 12:07 AM    To contact Stroke Continuity provider, please refer to [email or web address]. After hours, contact General Neurology

## 2018-06-15 NOTE — Progress Notes (Addendum)
ANTICOAGULATION CONSULT NOTE - Follow Up Consult  Pharmacy Consult for Coumadin Indication: stroke  Allergies  Allergen Reactions  . Lipitor [Atorvastatin] Nausea And Vomiting    Patient Measurements: Height: 6\' 2"  (188 cm) Weight: 232 lb (105.2 kg) IBW/kg (Calculated) : 82.2  Vital Signs: Temp: 97.8 F (36.6 C) (07/13 1049) Temp Source: Oral (07/13 1049) BP: 151/99 (07/13 0946) Pulse Rate: 86 (07/13 0946)  Labs: Recent Labs    06/14/18 1018 06/15/18 0356  HGB 15.5  --   HCT 46.0  --   PLT 387  --   APTT 26  --   LABPROT 14.7 18.0*  INR 1.16 1.50  CREATININE 1.21  --     Estimated Creatinine Clearance: 80.8 mL/min (by C-G formula based on SCr of 1.21 mg/dL).   Medications:  Medications Prior to Admission  Medication Sig Dispense Refill Last Dose  . carvedilol (COREG) 12.5 MG tablet TAKE 1 & 1/2 (ONE & ONE-HALF) TABLETS BY MOUTH TWICE DAILY 270 tablet 2 06/13/2018 at 11:00am  . cephALEXin (KEFLEX) 500 MG capsule Take 1 capsule (500 mg total) by mouth 3 (three) times daily for 5 days. 15 capsule 0 06/13/2018 at Unknown time  . dapagliflozin propanediol (FARXIGA) 5 MG TABS tablet Take 5 mg by mouth daily. 30 tablet 5 06/13/2018 at Unknown time  . enalapril (VASOTEC) 5 MG tablet TAKE 1 TABLET BY MOUTH TWICE DAILY (Patient taking differently: TAKE 1 TABLET (5mg ) BY MOUTH TWICE DAILY) 60 tablet 11 06/13/2018 at Unknown time  . finasteride (PROSCAR) 5 MG tablet Take 5 mg by mouth daily.   06/13/2018 at Unknown time  . HYDROcodone-acetaminophen (NORCO/VICODIN) 5-325 MG tablet Take 1 tablet by mouth every 4 (four) hours as needed for moderate pain. 12 tablet 0 Past Week at Unknown time  . metFORMIN (GLUCOPHAGE) 1000 MG tablet TAKE 1 TABLET BY MOUTH TWICE DAILY WITH A MEAL (Patient taking differently: TAKE 1 TABLET (1000mg ) BY MOUTH TWICE DAILY WITH A MEAL) 90 tablet 1 06/13/2018 at Unknown time  . ondansetron (ZOFRAN ODT) 4 MG disintegrating tablet 4mg  ODT q4 hours prn nausea/vomit  (Patient taking differently: Take 4 mg by mouth every 4 (four) hours as needed for nausea or vomiting. 4mg  ODT q4 hours prn nausea/vomit) 10 tablet 0 06/13/2018 at Unknown time  . tamsulosin (FLOMAX) 0.4 MG CAPS capsule Take 1 capsule (0.4 mg total) by mouth daily after supper. 90 capsule 1 06/13/2018 at Unknown time  . warfarin (COUMADIN) 10 MG tablet Take 10 mg by mouth daily.   06/14/2018 at 1:00am    Assessment: 62 yo M on Coumadin PTA for hx CVA.  Coumadin was held for a scheduled procedure on 7/3.  Coumadin was restarted outpt post-procedure but INR remains subtherapeutic.  Pt presented to [facility] ED with RLE numbness, dizziness, and speech deficits and noted to have 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  Coumadin 10mg  PO daily PTA  Goal of Therapy:  INR 2-3 Monitor platelets by anticoagulation protocol: Yes   Plan:  Coumadin 10mg  PO x 1 tonight. Daily INR  [employer], Pharm.D., BCPS Clinical Pharmacist Pager: [phone] Clinical phone for 06/15/2018 from 8:30-4:00 is [phone].  **Pharmacist phone directory can now be found on [email or web address] ([email or web address]).  Listed under [facility].  06/15/2018 2:48 PM  Addum:  Start heparin until INR >2.0.  No bolus with goal 0.3-0.5 units/ml.  Start drip at 1300 units/hr.  Check heparin level in 8 hours Talbert Cage, PharmD

## 2018-06-15 NOTE — Progress Notes (Signed)
At 1025 (pt up in chair) pt given am meds, pt c/o some nausea, and "don't feel good", c/o "feeling hot" to chest area, no fever. Pt given zofran and tylenol with other 2 am meds. Took in water with no problem and swallowed pills without problem.     Pt wanted to get back in bed, physical therapist was in to do pt eval at that time and assisted pt back to bed. After back to bed, wife gave pt a spoonful of applesauce and pt vomited.     ST in to eval pt's swallowing due to pt having problems with swallowing solid food yesterday per night RN shift report.  Pt in bed, after ST sat pt up in bed, (position change) pt vomitted approx 16oz of bile colored emesis ST stated.  ST is concerned that pt is having nausea due to chronic cerebella stroke per MRI.  Aware of pt bowel situation also, pt states last BM was most likely last weekend, 06/08/18, 2 days after he went home from surgery on prostate.  Pt states that he has not been eating much at home, had not felt like eating.    Notified Dr Rito Ehrlich of the above. Plans to do abdominal xray, and other nausea medication.

## 2018-06-15 NOTE — Evaluation (Signed)
Physical Therapy Evaluation Patient Details Name: Karl [name] MRN: [id number] DOB: January 09, 1956 Today's Date: 06/15/2018   History of Present Illness  This 62 y.o. male admitted with acute onsef ot dizziness, some Rt LE numbness, and intermittent diplopia.  MRI showed Acute infarcts in both the Right PCA and Right SCA territories    Clinical Impression  Pt admitted with above diagnosis. Pt currently with functional limitations due to the deficits listed below (see PT Problem List). PTA, pt independent with all mobility, working. Upon eval pt presents with L sided deficits, coordination, strength, balance deficits. Currently Mod A for safe transfers, standing EOB and correcting posture with cues and working with standing balance exercises. Posterior and L sided lean noted. Feel strongly that patient would benefit greatly  from level of skill and expertise with CVA population at CIR.  Pt will benefit from skilled PT to increase their independence and safety with mobility to allow discharge to the venue listed below.       Follow Up Recommendations CIR    Equipment Recommendations  (TBD)    Recommendations for Other Services Rehab consult     Precautions / Restrictions Precautions Precautions: Fall Precaution Comments: poor proprioceptive awareness of Lt  Restrictions Weight Bearing Restrictions: No      Mobility  Bed Mobility Overal bed mobility: Needs Assistance Bed Mobility: Sit to Supine     Supine to sit: Min assist Sit to supine: Min assist   General bed mobility comments: assist getting LLE over EOB, cues for usiing L leg with bed mobility to push up to higher position in bed, pt able to follow cues  Transfers Overall transfer level: Needs assistance Equipment used: Rolling walker (2 wheeled) Transfers: Sit to/from [employer] Sit to Stand: Mod assist   Squat pivot transfers: Mod assist     General transfer comment: pt standing EOB with mod A, L  and posterior lean noted. able to correct when cued, but not for ong.   Ambulation/Gait             General Gait Details: defered 2/2 safety   Stairs            Wheelchair Mobility    Modified Rankin (Stroke Patients Only) Modified Rankin (Stroke Patients Only) Pre-Morbid Rankin Score: No symptoms Modified Rankin: Moderately severe disability     Balance Overall balance assessment: Needs assistance Sitting-balance support: Bilateral upper extremity supported;Feet supported Sitting balance-Leahy Scale: Poor Sitting balance - Comments: Lt lateral lean.  requires min A intermittently to correct  Postural control: Left lateral lean Standing balance support: Bilateral upper extremity supported Standing balance-Leahy Scale: Poor Standing balance comment: requires mod A                              Pertinent Vitals/Pain Pain Assessment: Faces Faces Pain Scale: Hurts a little bit Pain Location: abdomen and chest - reports constipation  Pain Descriptors / Indicators: Burning Pain Intervention(s): Limited activity within patient's tolerance;Monitored during session;Premedicated before session;Repositioned    Home Living Family/patient expects to be discharged to:: Private residence Living Arrangements: Children;Other (Comment) Available Help at Discharge: Family;Available 24 hours/day Type of Home: House Home Access: Stairs to enter   [employer] of Steps: 1 Home Layout: One level Home Equipment: None      Prior Function Level of Independence: Independent         Comments: works packing climbing Pensions consultant  Dominance   Dominant Hand: Right    Extremity/Trunk Assessment   Upper Extremity Assessment Upper Extremity Assessment: Defer to OT evaluation LUE Deficits / Details: Drift present.   AROM WFL, but he has poor awareness of position of UE in space.    LUE Sensation: decreased proprioception LUE Coordination: decreased fine  motor;decreased gross motor    Lower Extremity Assessment Lower Extremity Assessment: LLE deficits/detail(RLE strength 5/5 gross, LLE 4/5 gross) LLE Sensation: decreased proprioception LLE Coordination: decreased gross motor    Cervical / Trunk Assessment Cervical / Trunk Assessment: Other exceptions Cervical / Trunk Exceptions: Decreased trunk control on the Lt   Communication   Communication: No difficulties  Cognition Arousal/Alertness: Awake/alert Behavior During Therapy: Flat affect Overall Cognitive Status: Impaired/Different from baseline Area of Impairment: Attention;Following commands;Safety/judgement;Awareness;Problem solving                   Current Attention Level: Sustained   Following Commands: Follows one step commands consistently Safety/Judgement: Decreased awareness of safety;Decreased awareness of deficits Awareness: Intellectual Problem Solving: Slow processing;Decreased initiation;Difficulty sequencing;Requires verbal cues;Requires tactile cues General Comments: mild improvement from OT session more alert and following commands      General Comments General comments (skin integrity, edema, etc.): BP 160/100, SpO2 100, HR 85    Exercises General Exercises - Lower Extremity Long Arc Quad: 10 reps   Assessment/Plan    PT Assessment Patient needs continued PT services  PT Problem List Decreased strength;Decreased range of motion;Decreased activity tolerance;Decreased safety awareness;Decreased knowledge of use of DME;Decreased coordination;Decreased balance;Decreased mobility       PT Treatment Interventions DME instruction;Gait training;Functional mobility training;Stair training;Therapeutic activities;Therapeutic exercise;Balance training;Cognitive remediation;Neuromuscular re-education;Patient/family education    PT Goals (Current goals can be found in the Care Plan section)  Acute Rehab PT Goals Patient Stated Goal: to get stronger  PT Goal  Formulation: With patient Time For Goal Achievement: 06/22/18 Potential to Achieve Goals: Good    Frequency Min 4X/week   Barriers to discharge        Co-evaluation               AM-PAC PT "6 Clicks" Daily Activity  Outcome Measure Difficulty turning over in bed (including adjusting bedclothes, sheets and blankets)?: Unable Difficulty moving from lying on back to sitting on the side of the bed? : Unable Difficulty sitting down on and standing up from a chair with arms (e.g., wheelchair, bedside commode, etc,.)?: Unable Help needed moving to and from a bed to chair (including a wheelchair)?: A Lot Help needed walking in hospital room?: A Lot Help needed climbing 3-5 steps with a railing? : Total 6 Click Score: 8    End of Session Equipment Utilized During Treatment: Gait belt Activity Tolerance: Patient tolerated treatment well Patient left: in bed;with call bell/phone within reach;with nursing/sitter in room;with family/visitor present Nurse Communication: Mobility status PT Visit Diagnosis: Unsteadiness on feet (R26.81);Other abnormalities of gait and mobility (R26.89);Repeated falls (R29.6);Muscle weakness (generalized) (M62.81);Dizziness and giddiness (R42)    Time: 1031-1100 PT Time Calculation (min) (ACUTE ONLY): 29 min   Charges:   PT Evaluation $PT Eval Low Complexity: 1 Low PT Treatments $Therapeutic Activity: 8-22 mins   PT G Codes:       Etta Grandchild, PT, DPT Acute Rehab Services Pager: [phone]    Etta Grandchild 06/15/2018, 11:19 AM

## 2018-06-15 NOTE — Progress Notes (Signed)
Initial Nutrition Assessment  DOCUMENTATION CODES:   Not applicable  INTERVENTION:  Monitor for needs, diet advancement Patient does not want interventions right now.  NUTRITION DIAGNOSIS:   Inadequate oral intake related to nausea, vomiting as evidenced by per patient/family report.  GOAL:   Patient will meet greater than or equal to 90% of their needs  MONITOR:   Diet advancement  REASON FOR ASSESSMENT:   Malnutrition Screening Tool    ASSESSMENT:   Patient with PMH posterior circulation stroke, DM2, HTN, CAD, CHF, presents with acute CVA   Spoke with patient and his family at bedside. Patient states that following his previous surgery (cytoscopy and transurethral resection and vaporization of prostate on 7/3) he ate well for 3-4 days. Asked patient what a normal day of PO intake looks like for him and repeatedly stated "I eat whatever I want," when asked for specifics about breakfast, lunch, and dinner. Apparently his wife or his daughter cook for him normally. Reports a UBW of "two thirty something," and a 7-8 pound weight loss since surgery. He states that after those 3-4 days he ate "nothing at all" and experienced nausea and vomiting until he was admitted here. Appetite here was ok until yesterday when he had issues chewing meat and harder foods, was downgraded to full liquids. Today he was downgraded by SLP to clear liquids due to increased WOB with PO intake. He also vomited after SLP raised his HOB to provide PO but prior to actually ingesting anything. She will do a MBS when possible. Patient only wants water and chicken broth at this time.  RD will follow for supplemental nutrition needs.  Medications reviewed and include:  Insulin Labs reviewed:  CBGs 154, 174   NUTRITION - FOCUSED PHYSICAL EXAM:    Most Recent Value  Orbital Region  No depletion  Upper Arm Region  No depletion  Thoracic and Lumbar Region  No depletion  Buccal Region  No depletion  Temple  Region  Moderate depletion  Clavicle Bone Region  No depletion  Clavicle and Acromion Bone Region  No depletion  Scapular Bone Region  No depletion  Dorsal Hand  No depletion  Patellar Region  No depletion  Anterior Thigh Region  No depletion  Posterior Calf Region  No depletion  Edema (RD Assessment)  None  Hair  Reviewed  Eyes  Reviewed  Mouth  Reviewed  Skin  Reviewed  Nails  Reviewed       Diet Order:   Diet Order           Diet clear liquid Room service appropriate? Yes; Fluid consistency: Thin  Diet effective now          EDUCATION NEEDS:   Not appropriate for education at this time  Skin:  Skin Assessment: Reviewed RN Assessment  Last BM:  06/12/2018  Height:   Ht Readings from Last 1 Encounters:  06/14/18 6\' 2"  (1.88 m)    Weight:   Wt Readings from Last 1 Encounters:  06/14/18 232 lb (105.2 kg)    Ideal Body Weight:  80.9 kg  BMI:  Body mass index is 29.79 kg/m.  Estimated Nutritional Needs:   Kcal:  1950-2200 calories  Protein:  110-125 grams  Fluid:  >2L    Dionne Ano. Tanette Chauca, MS, RD LDN Inpatient Clinical Dietitian Pager [phone]

## 2018-06-15 NOTE — Evaluation (Signed)
Occupational Therapy Evaluation Patient Details Name: Karl [name] MRN: [id number] DOB: March 21, 1956 Today's Date: 06/15/2018    History of Present Illness This 62 y.o. male admitted with acute onsef ot dizziness, some Rt LE numbness, and intermittent diplopia.  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 He demonstrates the below listed deficits and will benefit from continued OT to maximize safety and independence with BADLs.  Pt presents to OT with impaired balance, decreased proprioceptive awareness of Lt side, ? Lt inattention vs Lt field deficit, Lt UE incoordination, impaired cognition, decreased activity tolerance.  He currently requires mod  - max A for ADL and min A for squat pivot transfers to the Rt, mod A sit to stand - demonstrates significant Lt lateral lean.   He lives with daughter and adult grandsons, was fully independent and working full time PTA.   Recommend CIR as he would benefit from therapists with extensive experience working with CVA populations, the consistency and intensity of CIR to allow him to maximize his independence with ADLs.  Will follow acutely.       Follow Up Recommendations  CIR;Supervision/Assistance - 24 hour    Equipment Recommendations  None recommended by OT    Recommendations for Other Services Rehab consult     Precautions / Restrictions Precautions Precautions: Fall Precaution Comments: poor proprioceptive awareness of Lt       Mobility Bed Mobility Overal bed mobility: Needs Assistance Bed Mobility: Supine to Sit     Supine to sit: Min assist     General bed mobility comments: increased time, verbal cues for sequencing, heavy reliance on bedrails.  Requires min A to lift trunk   Transfers Overall transfer level: Needs assistance Equipment used: Rolling walker (2 wheeled) Transfers: Sit to/from [employer] Sit to Stand: Mod assist    Squat pivot transfers: Min assist     General transfer comment: Pt stood with mod A.  He demonstrates heavy lean to Lt.  He hyperextends Lt knee, and demonstrates difficulty extending Lt hip and trunk.  Extends through  Lt UE on walker.   Pt requires min A to squat pivot transfer to the Rt - assist to steady     Balance Overall balance assessment: Needs assistance Sitting-balance support: Bilateral upper extremity supported;Feet supported Sitting balance-Leahy Scale: Poor Sitting balance - Comments: Lt lateral lean.  requires min A intermittently to correct  Postural control: Left lateral lean Standing balance support: Bilateral upper extremity supported Standing balance-Leahy Scale: Poor Standing balance comment: requires mod A                            ADL either performed or assessed with clinical judgement   ADL Overall ADL's : Needs assistance/impaired Eating/Feeding: Set up;Sitting;Bed level   Grooming: Wash/dry hands;Wash/dry face;Oral care;Brushing hair;Minimal assistance;Sitting   Upper Body Bathing: Moderate assistance;Sitting   Lower Body Bathing: Maximal assistance;Sit to/from stand   Upper Body Dressing : Moderate assistance;Sitting   Lower Body Dressing: Maximal assistance;Sit to/from stand   Toilet Transfer: Minimal assistance;Squat-pivot;BSC   Toileting- Clothing Manipulation and Hygiene: Maximal assistance;Sit to/from stand       Functional mobility during ADLs: Minimal assistance;Moderate assistance;Rolling walker General ADL Comments: Pt fatigues quickly with activity.  Keeps eyes closed much of the time      Vision Baseline Vision/History: Wears glasses Wears Glasses: Reading only(and for work )  Additional Comments: Pt unable to fully participate in visual assessment due to feeling poorly and keeing eyes closed.  He does demonstrates full EOMs.  He reports vision is back to normal.  RN reports possible Lt field deficit vs neglect       Perception Perception Perception Tested?: Yes Perception Deficits: Inattention/neglect Inattention/Neglect: Does not attend to left visual field;Does not attend to left side of body Spatial deficits: difficult to accurately assess due to pt distracted by feeling poorly  Comments: Lt hand still holding to walker upon sitting, and pt required cues from therapist to correct    Praxis Praxis Praxis tested?: Deficits Deficits: Initiation    Pertinent Vitals/Pain Pain Assessment: Faces Faces Pain Scale: Hurts even more Pain Location: abdomen and chest - reports constipation  Pain Descriptors / Indicators: Burning Pain Intervention(s): Monitored during session;Limited activity within patient's tolerance;Repositioned     Hand Dominance Right   Extremity/Trunk Assessment Upper Extremity Assessment Upper Extremity Assessment: LUE deficits/detail LUE Deficits / Details: Drift present.   AROM WFL, but he has poor awareness of position of UE in space.    LUE Sensation: decreased proprioception LUE Coordination: decreased fine motor;decreased gross motor   Lower Extremity Assessment Lower Extremity Assessment: Defer to PT evaluation   Cervical / Trunk Assessment Cervical / Trunk Assessment: Other exceptions Cervical / Trunk Exceptions: Decreased trunk control on the Lt    Communication Communication Communication: No difficulties   Cognition Arousal/Alertness: Awake/alert Behavior During Therapy: Flat affect Overall Cognitive Status: Impaired/Different from baseline Area of Impairment: Attention;Following commands;Safety/judgement;Awareness;Problem solving                   Current Attention Level: Sustained(with cues )   Following Commands: Follows one step commands consistently Safety/Judgement: Decreased awareness of safety;Decreased awareness of deficits Awareness: Intellectual Problem Solving: Slow processing;Decreased initiation;Difficulty sequencing;Requires verbal  cues;Requires tactile cues General Comments: Pt feeling poorly and with slowed response times.  Self distracts due to discomfort    General Comments  BP 138/101; HR 89 - RN made aware of BP and c/o chest/abdominal pain/burning     Exercises     Shoulder Instructions      Home Living Family/patient expects to be discharged to:: Private residence Living Arrangements: Children;Other (Comment)(grandsons who are in their 20's ) Available Help at Discharge: Family;Available 24 hours/day Type of Home: House Home Access: Stairs to enter [employer] of Steps: 1   Home Layout: One level     Bathroom Shower/Tub: Chief Strategy Officer: Handicapped height     Home Equipment: None          Prior Functioning/Environment Level of Independence: Independent        Comments: works packing climbing ropes         OT Problem List: Decreased strength;Decreased activity tolerance;Impaired balance (sitting and/or standing);Impaired vision/perception;Decreased coordination;Decreased cognition;Decreased safety awareness;Decreased knowledge of use of DME or AE;Impaired sensation;Impaired UE functional use;Pain      OT Treatment/Interventions: Self-care/ADL training;Neuromuscular education;DME and/or AE instruction;Therapeutic activities;Cognitive remediation/compensation;Visual/perceptual remediation/compensation;Patient/family education;Balance training    OT Goals(Current goals can be found in the care plan section) Acute Rehab OT Goals Patient Stated Goal: To go the bathroom and feel better  OT Goal Formulation: With patient Time For Goal Achievement: 06/29/18 Potential to Achieve Goals: Good ADL Goals Pt Will Perform Grooming: with min assist;standing Pt Will Perform Upper Body Bathing: with set-up;with supervision;sitting Pt Will Perform Lower Body Bathing: with min assist;sit to/from stand Pt Will Perform Upper Body Dressing: with  supervision;sitting Pt Will  Perform Lower Body Dressing: with min assist;sit to/from stand Pt Will Transfer to Toilet: with min assist;ambulating;regular height toilet;bedside commode;grab bars Pt Will Perform Toileting - Clothing Manipulation and hygiene: with min assist;sit to/from stand Additional ADL Goal #1: Pt will locate items on Lt during ADL with no more than 2 cues Additional ADL Goal #2: Pt will use Lt UE as an active assist consistently.  OT Frequency: Min 2X/week   Barriers to D/C:            Co-evaluation              AM-PAC PT "6 Clicks" Daily Activity     Outcome Measure Help from another person eating meals?: A Little Help from another person taking care of personal grooming?: A Little Help from another person toileting, which includes using toliet, bedpan, or urinal?: A Lot Help from another person bathing (including washing, rinsing, drying)?: A Lot Help from another person to put on and taking off regular upper body clothing?: A Lot   6 Click Score: 12   End of Session Equipment Utilized During Treatment: Rolling walker;Gait belt Nurse Communication: Mobility status  Activity Tolerance: Patient limited by fatigue;Patient limited by pain Patient left: in chair;with call bell/phone within reach;with chair alarm set  OT Visit Diagnosis: Unsteadiness on feet (R26.81);Muscle weakness (generalized) (M62.81);Ataxia, unspecified (R27.0);Cognitive communication deficit (R41.841);Dizziness and giddiness (R42);Hemiplegia and hemiparesis Symptoms and signs involving cognitive functions: Cerebral infarction Hemiplegia - Right/Left: Left Hemiplegia - dominant/non-dominant: Non-Dominant Hemiplegia - caused by: Cerebral infarction                Time: 4174-0814 OT Time Calculation (min): 38 min Charges:  OT General Charges $OT Visit: 1 Visit OT Evaluation $OT Eval Moderate Complexity: 1 Mod OT Treatments $Self Care/Home Management : 8-22 mins $Neuromuscular Re-education: 8-22 mins G-Codes:      [employer], OTR/L [phone]   Jeani Hawking M 06/15/2018, 10:07 AM

## 2018-06-15 NOTE — Progress Notes (Signed)
Pt given evening meds with sprite, pt wanted to try miralax for BM, declined enema at this time. abd xray normal. Pt states he has passed a little gas today. Pt states he started having nausea like feeling and vomiting at home few days after home from prostate surgery and was only taking in broth and water he states.  Pt sat up slowly in bed to take meds. Will monitor to see if vomits. Emesis bag given to pt.   BP 145/108, 154/100, Dr Rito Ehrlich informed of normal abd xray and BP.

## 2018-06-15 NOTE — Progress Notes (Signed)
[facility] PROGRESS NOTE  Karl [name] UEA:[id number] DOB: [birth year] DOA: 06/14/2018  PCP: Pincus Sanes, MD  Brief History/Interval Summary: 62 year old African-American male with a past medical history of the artery disease, diabetes mellitus, hypertension, history of stroke earlier this year for which he was prescribed warfarin, BPH who recently underwent a TURP procedure on July 3.  His warfarin was discontinued for this surgery.  He has a Foley catheter in place.  Presented with complains of dizziness and being unsteady on his feet.  Initial evaluation raise concern for acute stroke.  Patient was hospitalized for further management.  Reason for Visit: Acute stroke  Consultants: Neurology  Procedures:  Transthoracic echocardiogram is pending  Antibiotics: Keflex  Subjective/Interval History: Patient complains of feeling very constipated today.  Denies any abdominal pain.  Some nausea.  Feels weak on the left side.  ROS: Denies any shortness of breath.  Objective:  Vital Signs  Vitals:   06/15/18 0743 06/15/18 0930 06/15/18 0946 06/15/18 1049  BP: (!) 164/97 (!) 130/108 (!) 151/99   Pulse: 80 88 86   Resp: 18 20 17    Temp:    97.8 F (36.6 C)  TempSrc:    Oral  SpO2: 100% 100% 96%   Weight:      Height:        Intake/Output Summary (Last 24 hours) at 06/15/2018 1113 Last data filed at 06/15/2018 0408 Gross per 24 hour  Intake -  Output 700 ml  Net -700 ml   Filed Weights   06/14/18 0958  Weight: 105.2 kg (232 lb)    General appearance: alert, cooperative, appears stated age and no distress Head: Normocephalic, without obvious abnormality, atraumatic Resp: clear to auscultation bilaterally Cardio: regular rate and rhythm, S1, S2 normal, no murmur, click, rub or gallop GI: soft, non-tender; bowel sounds normal; no masses,  no organomegaly Extremities: extremities normal, atraumatic, no cyanosis or edema Pulses: 2+ and symmetric Neurologic:  Patient is alert and oriented x3.  Cranial nerves II through XII appear to be intact.  He does have left-sided pronator drift.  Subtle weakness of the left lower extremity also noted.   Lab Results:  Data Reviewed: I have personally reviewed following labs and imaging studies  CBC: Recent Labs  Lab 06/11/18 1606 06/14/18 1018  WBC 11.0* 10.9*  NEUTROABS  --  7.8*  HGB 14.9 15.5  HCT 44.2 46.0  MCV 75.8* 75.8*  PLT 398 387    Basic Metabolic Panel: Recent Labs  Lab 06/11/18 1606 06/14/18 1018  NA 139 138  K 4.3 4.1  CL 101 96*  CO2 21* 23  GLUCOSE 166* 172*  BUN 21 30*  CREATININE 1.10 1.21  CALCIUM 10.0 9.7    GFR: Estimated Creatinine Clearance: 80.8 mL/min (by C-G formula based on SCr of 1.21 mg/dL).  Liver Function Tests: Recent Labs  Lab 06/11/18 1606 06/14/18 1018  AST 15 13*  ALT 21 17  ALKPHOS 68 65  BILITOT 0.9 1.2  PROT 9.2* 8.8*  ALBUMIN 3.7 3.7    Recent Labs  Lab 06/11/18 1606  LIPASE 20    Coagulation Profile: Recent Labs  Lab 06/14/18 1018 06/15/18 0356  INR 1.16 1.50    HbA1C: Recent Labs    06/15/18 0356  HGBA1C 6.8*    CBG: Recent Labs  Lab 06/11/18 1706 06/14/18 1003 06/14/18 1853 06/14/18 2127 06/15/18 0710  GLUCAP 146* 182* 173* 154* 174*    Lipid Profile: Recent Labs    06/15/18 0356  CHOL 238*  HDL 29*  LDLCALC 182*  TRIG 137  CHOLHDL 8.2     Recent Results (from the past 240 hour(s))  Urine culture     Status: Abnormal   Collection Time: 06/11/18  3:59 PM  Result Value Ref Range Status   Specimen Description   Final    URINE, CATHETERIZED Performed at [facility] [address], [place], [place] [address]    Special Requests   Final    NONE Performed at [facility], [address], [place], [place] [address]    Culture (A)  Final    >=100,000 COLONIES/mL KLEBSIELLA PNEUMONIAE >=100,000 COLONIES/mL ENTEROCOCCUS FAECALIS    Report Status  06/14/2018 FINAL  Final   Organism ID, Bacteria KLEBSIELLA PNEUMONIAE (A)  Final   Organism ID, Bacteria ENTEROCOCCUS FAECALIS (A)  Final      Susceptibility   Enterococcus faecalis - MIC*    AMPICILLIN <=2 SENSITIVE Sensitive     LEVOFLOXACIN 1 SENSITIVE Sensitive     NITROFURANTOIN <=16 SENSITIVE Sensitive     VANCOMYCIN 1 SENSITIVE Sensitive     * >=100,000 COLONIES/mL ENTEROCOCCUS FAECALIS   Klebsiella pneumoniae - MIC*    AMPICILLIN RESISTANT Resistant     CEFAZOLIN <=4 SENSITIVE Sensitive     CEFTRIAXONE <=1 SENSITIVE Sensitive     CIPROFLOXACIN <=0.25 SENSITIVE Sensitive     GENTAMICIN <=1 SENSITIVE Sensitive     IMIPENEM <=0.25 SENSITIVE Sensitive     NITROFURANTOIN <=16 SENSITIVE Sensitive     TRIMETH/SULFA <=20 SENSITIVE Sensitive     AMPICILLIN/SULBACTAM 4 SENSITIVE Sensitive     PIP/TAZO <=4 SENSITIVE Sensitive     Extended ESBL NEGATIVE Sensitive     * >=100,000 COLONIES/mL KLEBSIELLA PNEUMONIAE      Radiology Studies: Ct Angio Head W Or Wo Contrast  Result Date: 06/14/2018 CLINICAL DATA:  62 y/o M; right leg numbness, dizziness, slurred speech. EXAM: CT ANGIOGRAPHY HEAD AND NECK TECHNIQUE: Multidetector CT imaging of the head and neck was performed using the standard protocol during bolus administration of intravenous contrast. Multiplanar CT image reconstructions and MIPs were obtained to evaluate the vascular anatomy. Carotid stenosis measurements (when applicable) are obtained utilizing NASCET criteria, using the distal internal carotid diameter as the denominator. CONTRAST:  50mL ISOVUE-370 IOPAMIDOL (ISOVUE-370) INJECTION 76% COMPARISON:  06/14/2018 MRI of the head. 12/20/2017 MRI and MRA of the head. FINDINGS: CT HEAD FINDINGS Brain: Small chronic infarct in the left cerebellar hemisphere. Small areas of lucency in the right medial occipital lobe, right superior cerebellum, and right posterior thalamus corresponding to regions of acute infarction on MRI of the head.  No evidence for new intracranial hemorrhage, stroke, or mass effect. No extra-axial collection, hydrocephalus, or herniation. Vascular: As below. Skull: Normal. Negative for fracture or focal lesion. Sinuses: Imaged portions are clear. Orbits: No acute finding. Review of the MIP images confirms the above findings CTA NECK FINDINGS Aortic arch: Bovine variant branching. Imaged portion shows no evidence of aneurysm or dissection. No significant stenosis of the major arch vessel origins. Right carotid system: No evidence of dissection, stenosis (50% or greater) or occlusion. Minimal calcific atherosclerosis without stenosis. Left carotid system: No evidence of dissection, stenosis (50% or greater) or occlusion. Moderate carotid bifurcation mixed plaque without significant stenosis. Vertebral arteries: Left dominant. No evidence of dissection, stenosis (50% or greater) or occlusion. Skeleton: Mild cervical spondylosis. No high-grade bony canal stenosis. Other neck: Negative. Upper chest: Negative. Review of the MIP images confirms the above findings  CTA HEAD FINDINGS Anterior circulation: No significant stenosis, proximal occlusion, aneurysm, or vascular malformation. Mild calcific atherosclerosis of carotid siphons. Posterior circulation: Right PCA occlusion at the distal P1 segment (series 10, image 118). No additional occlusion, significant stenosis, or aneurysm in the posterior circulation. Venous sinuses: As permitted by contrast timing, patent. Anatomic variants: Anterior communicating artery and left posterior communicating arteries are present. No right posterior communicating artery identified, likely hypoplastic or absent. Delayed phase: No abnormal intracranial enhancement. Review of the MIP images confirms the above findings IMPRESSION: CTA head: 1. Right proximal PCA occlusion at distal P1 segment. Intermediate collateralization of right PCA distribution. 2. No additional intracranial large vessel  occlusion. No aneurysm, significant stenosis, or vascular malformation. CTA neck: Patent carotid and vertebral arteries. No high-grade stenosis by NASCET criteria, dissection, or aneurysm. CT head: 1. Areas of hypoattenuation in right thalamus, right medial occipital lobe, and right superior cerebellum corresponding to acute infarcts on MRI of the brain. 2. No new intracranial hemorrhage, stroke, or focal mass effect. These results will be called to the ordering clinician or representative by the Radiologist Assistant, and communication documented in the PACS or zVision Dashboard. Electronically Signed   By: Mitzi Hansen M.D.   On: 06/14/2018 21:38   Dg Chest 2 View  Result Date: 06/14/2018 CLINICAL DATA:  Encounter for CVA. EXAM: CHEST - 2 VIEW COMPARISON:  July 03, 2013 FINDINGS: The heart size and mediastinal contours are within normal limits. Both lungs are clear. The visualized skeletal structures are unremarkable. IMPRESSION: No active cardiopulmonary disease. Electronically Signed   By: Gerome Sam III M.D   On: 06/14/2018 17:20   Ct Head Wo Contrast  Result Date: 06/14/2018 CLINICAL DATA:  TIA. EXAM: CT HEAD WITHOUT CONTRAST TECHNIQUE: Contiguous axial images were obtained from the base of the skull through the vertex without intravenous contrast. COMPARISON:  MRI head 12/20/2017, CT head 12/19/2017 FINDINGS: Brain: Ill-defined hypodensity right occipital lobe compatible with acute infarct. This was not present previously. No associated hemorrhage Mild atrophy. Mild chronic microvascular ischemia in the white matter. Small chronic infarcts in the cerebellum bilaterally. Vascular: Negative for hyperdense vessel Skull: Negative Sinuses/Orbits: Negative Other: None IMPRESSION: Hypodensity right occipital lobe compatible with acute infarct. Negative for hemorrhage. These results were called by telephone at the time of interpretation on 06/14/2018 at 11:03 am to Dr. Linwood Dibbles , who verbally  acknowledged these results. Electronically Signed   By: Marlan Palau M.D.   On: 06/14/2018 11:03   Ct Angio Neck W Or Wo Contrast  Result Date: 06/14/2018 CLINICAL DATA:  62 y/o M; right leg numbness, dizziness, slurred speech. EXAM: CT ANGIOGRAPHY HEAD AND NECK TECHNIQUE: Multidetector CT imaging of the head and neck was performed using the standard protocol during bolus administration of intravenous contrast. Multiplanar CT image reconstructions and MIPs were obtained to evaluate the vascular anatomy. Carotid stenosis measurements (when applicable) are obtained utilizing NASCET criteria, using the distal internal carotid diameter as the denominator. CONTRAST:  50mL ISOVUE-370 IOPAMIDOL (ISOVUE-370) INJECTION 76% COMPARISON:  06/14/2018 MRI of the head. 12/20/2017 MRI and MRA of the head. FINDINGS: CT HEAD FINDINGS Brain: Small chronic infarct in the left cerebellar hemisphere. Small areas of lucency in the right medial occipital lobe, right superior cerebellum, and right posterior thalamus corresponding to regions of acute infarction on MRI of the head. No evidence for new intracranial hemorrhage, stroke, or mass effect. No extra-axial collection, hydrocephalus, or herniation. Vascular: As below. Skull: Normal. Negative for fracture or focal lesion. Sinuses:  Imaged portions are clear. Orbits: No acute finding. Review of the MIP images confirms the above findings CTA NECK FINDINGS Aortic arch: Bovine variant branching. Imaged portion shows no evidence of aneurysm or dissection. No significant stenosis of the major arch vessel origins. Right carotid system: No evidence of dissection, stenosis (50% or greater) or occlusion. Minimal calcific atherosclerosis without stenosis. Left carotid system: No evidence of dissection, stenosis (50% or greater) or occlusion. Moderate carotid bifurcation mixed plaque without significant stenosis. Vertebral arteries: Left dominant. No evidence of dissection, stenosis (50% or  greater) or occlusion. Skeleton: Mild cervical spondylosis. No high-grade bony canal stenosis. Other neck: Negative. Upper chest: Negative. Review of the MIP images confirms the above findings CTA HEAD FINDINGS Anterior circulation: No significant stenosis, proximal occlusion, aneurysm, or vascular malformation. Mild calcific atherosclerosis of carotid siphons. Posterior circulation: Right PCA occlusion at the distal P1 segment (series 10, image 118). No additional occlusion, significant stenosis, or aneurysm in the posterior circulation. Venous sinuses: As permitted by contrast timing, patent. Anatomic variants: Anterior communicating artery and left posterior communicating arteries are present. No right posterior communicating artery identified, likely hypoplastic or absent. Delayed phase: No abnormal intracranial enhancement. Review of the MIP images confirms the above findings IMPRESSION: CTA head: 1. Right proximal PCA occlusion at distal P1 segment. Intermediate collateralization of right PCA distribution. 2. No additional intracranial large vessel occlusion. No aneurysm, significant stenosis, or vascular malformation. CTA neck: Patent carotid and vertebral arteries. No high-grade stenosis by NASCET criteria, dissection, or aneurysm. CT head: 1. Areas of hypoattenuation in right thalamus, right medial occipital lobe, and right superior cerebellum corresponding to acute infarcts on MRI of the brain. 2. No new intracranial hemorrhage, stroke, or focal mass effect. These results will be called to the ordering clinician or representative by the Radiologist Assistant, and communication documented in the PACS or zVision Dashboard. Electronically Signed   By: Mitzi Hansen M.D.   On: 06/14/2018 21:38   Mr Brain Wo Contrast  Result Date: 06/14/2018 CLINICAL DATA:  62 year old male with recurrent right leg numbness since yesterday. Evidence of right PCA territory infarct on noncontrast head CT earlier  today. EXAM: MRI HEAD WITHOUT CONTRAST TECHNIQUE: Multiplanar, multiecho pulse sequences of the brain and surrounding structures were obtained without intravenous contrast. COMPARISON:  Head CT without contrast 1030 hours today. Brain MRI 12/20/2017. FINDINGS: Brain: Confluent 4-5 centimeter area of restricted diffusion throughout the medial right occipital lobe tracking toward the tail of the right hippocampus (series 4, image 19). Associated patchy and nodular restricted diffusion in the right thalamus. There is also a superimposed 2 centimeter linear area of restricted diffusion in the right superior cerebellar artery territory. Furthermore, there is a punctate solitary focus of restricted diffusion in the contralateral left occipital subcortical white matter (series 4, image 23). Early T2 and FLAIR hyperintensity in the involved areas with no associated hemorrhage or mass effect. Underlying chronic left cerebellar PICA territory infarct. Largely normal for age gray and white matter signal elsewhere. No chronic cerebral cortical encephalomalacia. No chronic cerebral blood products. No midline shift, mass effect, evidence of mass lesion, ventriculomegaly, extra-axial collection or acute intracranial hemorrhage. Cervicomedullary junction and pituitary are within normal limits. Vascular: Major intracranial vascular flow voids are stable since January and preserved. Skull and upper cervical spine: Stable and normal bone marrow signal. Negative visible cervical spine. Sinuses/Orbits: Stable and negative. Other: Trace fluid in posterior most right mastoid air cells has developed since January. The remaining bilateral mastoids remain clear. Negative nasopharynx. Scalp  and face soft tissues appear negative. IMPRESSION: 1. Acute infarcts in both the Right PCA and Right SCA territories with no associated hemorrhage or mass effect. 2. Superimposed punctate acute infarct also in Left PCA territory white matter. 3. This  pattern might indicate a recent embolic shower to the posterior circulation, however, there is an underlying chronic Left PICA territory infarct. Electronically Signed   By: Odessa Fleming M.D.   On: 06/14/2018 13:37     Medications:  Scheduled: . cephALEXin  500 mg Oral TID  . feeding supplement (ENSURE ENLIVE)  237 mL Oral BID BM  . finasteride  5 mg Oral Daily  . insulin aspart  0-9 Units Subcutaneous TID WC  . tamsulosin  0.4 mg Oral QPC supper  . Warfarin - Pharmacist Dosing Inpatient   Does not apply q1800   Continuous:  ONG:[id number] **OR** acetaminophen (TYLENOL) oral liquid 160 mg/5 mL **OR** acetaminophen, HYDROcodone-acetaminophen, ondansetron, senna-docusate  Assessment/Plan:    Acute stroke Neurology has been consulted.  Stroke work-up is in progress.  Echocardiogram pending.  Patient did undergo CT angiogram head and neck did not show any stenosis of the carotid arteries.  Other findings were noted to be addressed by neurology.  MRI brain report as above.  LDL 182.  HbA1c 6.8.  PT and OT evaluation.  MRI suggest embolic stroke.  Patient was previously on warfarin for the same.  However warfarin was held for his prostate surgery earlier this month.  However it was resumed but INR noted to be subtherapeutic. Aspirin. Further management per neurology.  Patient reportedly has intolerance to atorvastatin.  Unclear if other agents have been tried.  Abnormal UA with positive urine cultures Urine cultures from 7/9 was positive for Klebsiella as well as enterococcus.  Patient was prescribed a 5-day course of Keflex recently.  Sensitivities reviewed.  Klebsiella was sensitive to Keflex.  However enterococcus sensitive to Levaquin vancomycin and ampicillin.  WBC is only minimally elevated.  He is afebrile.  Patient has a indwelling Foley catheter.  Does not exhibit any clear signs or symptoms of UTI at this time.  We will continue to watch for now.  History of BPH and urinary retention  status post recent TURP on 06/05/2018 Continue Flomax and Proscar.  Continue Foley catheter.  Followed by Dr. Alvester Morin with urology.  Essential hypertension Allowing permissive hypertension.  Home medications including Coreg and enalapril on hold.    History of coronary artery disease Currently chest pain-free.  Continue to monitor.  Chronic systolic and diastolic CHF EF known to be 20 to 25% based on echocardiogram done in January.  He also has grade 1 diastolic dysfunction.  Currently euvolemic.  Strict ins and outs and daily weights.  Patient has previously refused ICD placement.  Followed by Dr. Ladona Ridgel.  On chronic anticoagulation This is for previous history of embolic stroke.  Patient without any other indication for anti-coagulation.  Warfarin had to be held for his surgery on 7/3.  It was resumed however INR noted to be subtherapeutic on admission.  Warfarin is being continued at this time.  Diabetes mellitus type 2 Holding his oral agents.  SSI.  Monitor CBGs.  Constipation If he does not have a bowel movement today could consider laxatives.  DVT Prophylaxis: On warfarin    Code Status: Full code Family Communication: Discussed with the patient Disposition Plan: Management as outlined above.  Will likely need rehab at the time of discharge.    LOS: 1 day   Osvaldo Shipper  [facility] Pager [phone] 06/15/2018, 11:13 AM  If 7PM-7AM, please contact night-coverage at [email or web address], password [facility]

## 2018-06-15 NOTE — Evaluation (Signed)
Clinical/Bedside Swallow Evaluation Patient Details  Name: Karl [name] MRN: [id number] Date of Birth: Apr 03, 1956  Today's Date: 06/15/2018 Time: SLP Start Time (ACUTE ONLY): 1210 SLP Stop Time (ACUTE ONLY): 1255 SLP Time Calculation (min) (ACUTE ONLY): 45 min  Past Medical History:  Past Medical History:  Diagnosis Date  . Acute CVA (cerebrovascular accident) (HCC) 06/14/2018  . Coronary artery disease    blockage   Stent Dr. Ladona Ridgel 15 years 1998  . Dyspnea   . Hypertension   . Stroke [facility]) 2019   denies residual on 06/14/2018  . Type II diabetes mellitus (HCC) 07/2014 dx   Past Surgical History:  Past Surgical History:  Procedure Laterality Date  . CORONARY ANGIOPLASTY WITH STENT PLACEMENT  1998  . FRACTURE SURGERY    . GREEN LIGHT LASER TURP (TRANSURETHRAL RESECTION OF PROSTATE N/A 06/05/2018   Procedure: GREEN LIGHT LASER TURP , TRANSURETHRAL RESECTION OF PROSTATE;  Surgeon: Crista Elliot, MD;  Location: [facility] ORS;  Service: Urology;  Laterality: N/A;  . TIBIA FRACTURE SURGERY Left 1980s   HPI:  62 yo male adm to [facility] with right leg numbness, dizziness and speech deficits - also n/v for days- found to have 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     Assessment / Plan / Recommendation Clinical Impression  Pt presents with concerns for pharyngeal deficits c/b hoarse/weak voice since surgery July 3rd.  He also reports sensation of food lodging in pharynx (pointing to vallecular space) requiring him to expectorate solids during this hospital stay.  Pt with vomiting after SLP raised HOB to provide po intake - Copious amounts *approx 16 ounces* of bilish emesis noted WITHOUT intake - suspect pt's cerebellar involvement may be contributing to his nausea/vomiting.    Observed pt with intake of ice chips and gingerale only.  No indication of aspiration/airway compromise however his WOB does increase to high 20s with po.  Recommend clear liquids only as tolerated and  when cleared by MD - MBS to rule out pharyngeal involvment given his isolated symptoms with solids.    Note pt to have abdomen film completed per RN and pt reports no bowel movement since July 5th.  Per daughter, pt tolerated po well for several days after surgery with sudden onset of problems a few days later resulting in n/v and very poor po.  Pt only able to tolerate water and chicken broth per his statement.  Spoke to RN, pt, pt's sister and his daughter who are all agreeable to plan.     SLP Visit Diagnosis: Dysphagia, unspecified (R13.10);Dysphagia, pharyngeal phase (R13.13)    Aspiration Risk  Moderate aspiration risk    Diet Recommendation Thin liquid(clears)   Liquid Administration via: Cup Medication Administration: Via alternative means(preferably via alternative means) Compensations: Small sips/bites;Slow rate Postural Changes: Seated upright at 90 degrees;Remain upright for at least 30 minutes after po intake    Other  Recommendations Oral Care Recommendations: Oral care BID   Follow up Recommendations   TBD     Frequency and Duration min 2x/week  1 week       Prognosis Prognosis for Safe Diet Advancement: Fair Barriers to Reach Goals: Time post onset      Swallow Study   General Date of Onset: 06/15/18 HPI: 62 yo male adm to [facility] with right leg numbness, dizziness and speech deficits - also n/v for days- found to have 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   Type of Study: Bedside  Swallow Evaluation Previous Swallow Assessment: none Diet Prior to this Study: Thin liquids(full liquids) Temperature Spikes Noted: No Respiratory Status: Room air History of Recent Intubation: No Behavior/Cognition: Alert;Cooperative Oral Cavity Assessment: Within Functional Limits Oral Care Completed by SLP: No Oral Cavity - Dentition: Other (Comment);Missing dentition(few dentition only) Self-Feeding Abilities: Needs assist Patient Positioning: Upright in  bed Baseline Vocal Quality: Hoarse;Low vocal intensity Volitional Cough: Strong Volitional Swallow: Able to elicit    Oral/Motor/Sensory Function Overall Oral Motor/Sensory Function: Mild impairment Lingual Strength: Suspected CN XII (hypoglossal) dysfunction   Ice Chips Ice chips: Within functional limits Presentation: Spoon   Thin Liquid Thin Liquid: Within functional limits Presentation: Cup;Spoon;Self Fed    Nectar Thick Nectar Thick Liquid: Not tested   Honey Thick Honey Thick Liquid: Not tested   Puree Puree: Not tested   Solid   GO   Solid: Not tested Other Comments: due to pt vomiting and reporting issues with sensing food lodging (pointing to vallecular region)        Chales Abrahams 06/15/2018,1:09 PM  Donavan Burnet, MS [facility] SLP [phone]

## 2018-06-15 NOTE — Progress Notes (Signed)
  Echocardiogram 2D Echocardiogram w/ definity has been performed.  Francisca Harbuck L Androw 06/15/2018, 2:30 PM

## 2018-06-15 NOTE — Telephone Encounter (Signed)
Pt with + UC currently adm at [facility] and bing treated with abx per [facility] Pharm d

## 2018-06-16 DIAGNOSIS — R339 Retention of urine, unspecified: Secondary | ICD-10-CM

## 2018-06-16 DIAGNOSIS — I63333 Cerebral infarction due to thrombosis of bilateral posterior cerebral arteries: Secondary | ICD-10-CM

## 2018-06-16 DIAGNOSIS — I255 Ischemic cardiomyopathy: Secondary | ICD-10-CM

## 2018-06-16 LAB — BASIC METABOLIC PANEL
Anion gap: 12 (ref 5–15)
BUN: 20 mg/dL (ref 8–23)
CALCIUM: 9.2 mg/dL (ref 8.9–10.3)
CO2: 28 mmol/L (ref 22–32)
CREATININE: 1.03 mg/dL (ref 0.61–1.24)
Chloride: 95 mmol/L — ABNORMAL LOW (ref 98–111)
GFR calc Af Amer: >60 mL/min (ref >60)
GFR calc non Af Amer: >60 mL/min (ref >60)
Glucose, Bld: 148 mg/dL — ABNORMAL HIGH (ref 70–99)
Potassium: 3.3 mmol/L — ABNORMAL LOW (ref 3.5–5.1)
SODIUM: 135 mmol/L (ref 135–145)

## 2018-06-16 LAB — GLUCOSE, CAPILLARY
GLUCOSE-CAPILLARY: 189 mg/dL — AB (ref 70–99)
Glucose-Capillary: 143 mg/dL — ABNORMAL HIGH (ref 70–99)
Glucose-Capillary: 187 mg/dL — ABNORMAL HIGH (ref 70–99)
Glucose-Capillary: 168 mg/dL — ABNORMAL HIGH (ref 70–99)

## 2018-06-16 LAB — CBC
HCT: 47.1 % (ref 39.0–52.0)
Hemoglobin: 14.8 g/dL (ref 13.0–17.0)
MCH: 24.1 pg — ABNORMAL LOW (ref 26.0–34.0)
MCHC: 31.4 g/dL (ref 30.0–36.0)
MCV: 76.7 fL — ABNORMAL LOW (ref 78.0–100.0)
Platelets: 375 10*3/uL (ref 150–400)
RBC: 6.14 MIL/uL — ABNORMAL HIGH (ref 4.22–5.81)
RDW: 14.2 % (ref 11.5–15.5)
WBC: 10.1 10*3/uL (ref 4.0–10.5)

## 2018-06-16 LAB — PROTIME-INR
INR: 1.85
Prothrombin Time: 21.1 seconds — ABNORMAL HIGH (ref 11.4–15.2)

## 2018-06-16 LAB — HEPARIN LEVEL (UNFRACTIONATED)
HEPARIN UNFRACTIONATED: 0.49 [IU]/mL (ref 0.30–0.70)
Heparin Unfractionated: 0.68 IU/mL (ref 0.30–0.70)

## 2018-06-16 MED ORDER — HEPARIN (PORCINE) IN NACL 100-0.45 UNIT/ML-% IJ SOLN
1150.0000 [IU]/h | INTRAMUSCULAR | Status: DC
  Administered 2018-06-16: 1300 [IU]/h via INTRAVENOUS
  Administered 2018-06-16: 1150 [IU]/h via INTRAVENOUS
  Filled 2018-06-16 (×2): qty 250

## 2018-06-16 MED ORDER — CARVEDILOL 12.5 MG PO TABS
12.5000 mg | ORAL_TABLET | Freq: Two times a day (BID) | ORAL | Status: DC
  Administered 2018-06-16 – 2018-06-18 (×6): 12.5 mg via ORAL
  Filled 2018-06-16 (×6): qty 1

## 2018-06-16 MED ORDER — ROSUVASTATIN CALCIUM 20 MG PO TABS
40.0000 mg | ORAL_TABLET | Freq: Every day | ORAL | Status: DC
  Administered 2018-06-16 – 2018-06-18 (×3): 40 mg via ORAL
  Filled 2018-06-16 (×3): qty 2

## 2018-06-16 MED ORDER — CARVEDILOL 12.5 MG PO TABS: 12.5000 mg | ORAL_TABLET | Freq: Two times a day (BID) | ORAL | Status: DC

## 2018-06-16 MED ORDER — WARFARIN SODIUM 5 MG PO TABS
10.0000 mg | ORAL_TABLET | Freq: Once | ORAL | Status: AC
  Administered 2018-06-16: 10 mg via ORAL
  Filled 2018-06-16: qty 2

## 2018-06-16 MED ORDER — POTASSIUM CHLORIDE CRYS ER 20 MEQ PO TBCR
40.0000 meq | EXTENDED_RELEASE_TABLET | Freq: Once | ORAL | Status: AC
  Administered 2018-06-16: 40 meq via ORAL
  Filled 2018-06-16: qty 2

## 2018-06-16 NOTE — Consult Note (Signed)
Physical Medicine and Rehabilitation Consult Reason for Consult: Karl [name] Referring Physician: Difficulties with balance and equilibrium   HPI: Karl [name] is a 62 y.o. male with a history of previous posterior circulation CVA who was admitted on 7/12 with dizziness and loss of balance.  MRI was performed ultimately which demonstrated infarcts in both the right PCA and right SCA territories as well as the left PCA territory.  Patient was seen by physical therapy yesterday and patient demonstrated difficulties with balance and equilibrium in addition to poor safety awareness.  Physical medicine rehab is consulted to assess patient for further rehab potential.   Review of Systems  Constitutional: Negative for fever.  HENT: Negative for hearing loss.   Eyes: Positive for double vision.  Respiratory: Negative for cough.   Cardiovascular: Negative for chest pain.  Gastrointestinal: Positive for nausea.  Musculoskeletal: Negative for myalgias.  Skin: Negative for rash.  Neurological: Positive for dizziness and focal weakness.  Psychiatric/Behavioral: Negative for depression.   Past Medical History:  Diagnosis Date  . Acute CVA (cerebrovascular accident) (HCC) 06/14/2018  . Coronary artery disease    blockage   Stent Dr. Ladona Ridgel 15 years 1998  . Dyspnea   . Hypertension   . Stroke [facility]) 2019   denies residual on 06/14/2018  . Type II diabetes mellitus (HCC) 07/2014 dx   Past Surgical History:  Procedure Laterality Date  . CORONARY ANGIOPLASTY WITH STENT PLACEMENT  1998  . FRACTURE SURGERY    . GREEN LIGHT LASER TURP (TRANSURETHRAL RESECTION OF PROSTATE N/A 06/05/2018   Procedure: GREEN LIGHT LASER TURP , TRANSURETHRAL RESECTION OF PROSTATE;  Surgeon: Crista Elliot, MD;  Location: [facility];  Service: Urology;  Laterality: N/A;  . TIBIA FRACTURE SURGERY Left 1980s   Family History  Problem Relation Age of Onset  . Diabetes Mother   . Diabetes Father   . Stroke Father 71    . Diabetes Sister   . Clotting disorder Sister 69  . Diabetes Other        nephew  . Diabetes Brother   . Diabetes Maternal Grandmother    Social History:  reports that he quit smoking about 17 years ago. His smoking use included cigarettes. He has a 10.00 pack-year smoking history. He has never used smokeless tobacco. He reports that he drinks alcohol. He reports that he has current or past drug history. Allergies:  Allergies  Allergen Reactions  . Lipitor [Atorvastatin] Nausea And Vomiting   Medications Prior to Admission  Medication Sig Dispense Refill  . carvedilol (COREG) 12.5 MG tablet TAKE 1 & 1/2 (ONE & ONE-HALF) TABLETS BY MOUTH TWICE DAILY 270 tablet 2  . cephALEXin (KEFLEX) 500 MG capsule Take 1 capsule (500 mg total) by mouth 3 (three) times daily for 5 days. 15 capsule 0  . dapagliflozin propanediol (FARXIGA) 5 MG TABS tablet Take 5 mg by mouth daily. 30 tablet 5  . enalapril (VASOTEC) 5 MG tablet TAKE 1 TABLET BY MOUTH TWICE DAILY (Patient taking differently: TAKE 1 TABLET (5mg ) BY MOUTH TWICE DAILY) 60 tablet 11  . finasteride (PROSCAR) 5 MG tablet Take 5 mg by mouth daily.    Marland Kitchen HYDROcodone-acetaminophen (NORCO/VICODIN) 5-325 MG tablet Take 1 tablet by mouth every 4 (four) hours as needed for moderate pain. 12 tablet 0  . metFORMIN (GLUCOPHAGE) 1000 MG tablet TAKE 1 TABLET BY MOUTH TWICE DAILY WITH A MEAL (Patient taking differently: TAKE 1 TABLET (1000mg ) BY MOUTH TWICE DAILY WITH A  MEAL) 90 tablet 1  . ondansetron (ZOFRAN ODT) 4 MG disintegrating tablet 4mg  ODT q4 hours prn nausea/vomit (Patient taking differently: Take 4 mg by mouth every 4 (four) hours as needed for nausea or vomiting. 4mg  ODT q4 hours prn nausea/vomit) 10 tablet 0  . tamsulosin (FLOMAX) 0.4 MG CAPS capsule Take 1 capsule (0.4 mg total) by mouth daily after supper. 90 capsule 1  . warfarin (COUMADIN) 10 MG tablet Take 10 mg by mouth daily.      Home: Home Living Family/patient expects to be  discharged to:: Private residence Living Arrangements: Children, Other (Comment) Available Help at Discharge: Family, Available 24 hours/day Type of Home: House Home Access: Stairs to enter [employer] of Steps: 1 Home Layout: One level Bathroom Shower/Tub: Engineer, manufacturing systems: Handicapped height Home Equipment: None  Functional History: Prior Function Level of Independence: Independent Comments: works packing climbing ropes  Functional Status:  Mobility: Bed Mobility Overal bed mobility: Needs Assistance Bed Mobility: Sit to Supine Supine to sit: Min assist Sit to supine: Min assist General bed mobility comments: assist getting LLE over EOB, cues for usiing L leg with bed mobility to push up to higher position in bed, pt able to follow cues Transfers Overall transfer level: Needs assistance Equipment used: Rolling walker (2 wheeled) Transfers: Sit to/from Stand, [employer] Transfers Sit to Stand: Mod assist Squat pivot transfers: Mod assist General transfer comment: pt standing EOB with mod A, L and posterior lean noted. able to correct when cued, but not for ong.  Ambulation/Gait General Gait Details: defered 2/2 safety     ADL: ADL Overall ADL's : Needs assistance/impaired Eating/Feeding: Set up, Sitting, Bed level Grooming: Wash/dry hands, Wash/dry face, Oral care, Brushing hair, Minimal assistance, Sitting Upper Body Bathing: Moderate assistance, Sitting Lower Body Bathing: Maximal assistance, Sit to/from stand Upper Body Dressing : Moderate assistance, Sitting Lower Body Dressing: Maximal assistance, Sit to/from stand Toilet Transfer: Minimal assistance, Squat-pivot, BSC Toileting- Clothing Manipulation and Hygiene: Maximal assistance, Sit to/from stand Functional mobility during ADLs: Minimal assistance, Moderate assistance, Rolling walker General ADL Comments: Pt fatigues quickly with activity.  Keeps eyes closed much of the time    Cognition: Cognition Overall Cognitive Status: Impaired/Different from baseline Orientation Level: Oriented X4 Cognition Arousal/Alertness: Awake/alert Behavior During Therapy: Flat affect Overall Cognitive Status: Impaired/Different from baseline Area of Impairment: Attention, Following commands, Safety/judgement, Awareness, Problem solving Current Attention Level: Sustained Following Commands: Follows one step commands consistently Safety/Judgement: Decreased awareness of safety, Decreased awareness of deficits Awareness: Intellectual Problem Solving: Slow processing, Decreased initiation, Difficulty sequencing, Requires verbal cues, Requires tactile cues General Comments: mild improvement from OT session more alert and following commands  Blood pressure 127/87, pulse 95, temperature 98.1 F (36.7 C), temperature source Oral, resp. rate (!) 22, height 6\' 2"  (1.88 m), weight 105.2 kg (232 lb), SpO2 98 %. Physical Exam  Constitutional: He is oriented to person, place, and time. He appears well-developed.  HENT:  Head: Normocephalic.  Eyes: Pupils are equal, round, and reactive to light.  Neck: Normal range of motion.  Cardiovascular: Normal rate and regular rhythm.  Respiratory: Effort normal. No respiratory distress.  GI: Soft. He exhibits no distension.  Musculoskeletal: Normal range of motion. He exhibits no edema.  Neurological: He is alert and oriented to person, place, and time. No cranial nerve deficit.  No diplopia, speech clear, voice slightly dysphonic, RUE 5/5. LUE 4- to 4/5 prox to distal. RLE 5/6, LLE 4+/5. No focal sensory deficits. Intact FTN. Cognitively  appropriate  Skin: Skin is warm.  Psychiatric: He has a normal mood and affect. His behavior is normal.    Results for orders placed or performed during the hospital encounter of 06/14/18 (from the past 24 hour(s))  Glucose, capillary     Status: Abnormal   Collection Time: 06/15/18 11:50 AM  Result Value Ref  Range   Glucose-Capillary 183 (H) 70 - 99 mg/dL  Glucose, capillary     Status: Abnormal   Collection Time: 06/15/18  4:31 PM  Result Value Ref Range   Glucose-Capillary 145 (H) 70 - 99 mg/dL   Comment 1 Notify RN    Comment 2 Document in Chart   Glucose, capillary     Status: Abnormal   Collection Time: 06/15/18 10:40 PM  Result Value Ref Range   Glucose-Capillary 140 (H) 70 - 99 mg/dL  Glucose, capillary     Status: Abnormal   Collection Time: 06/16/18  6:13 AM  Result Value Ref Range   Glucose-Capillary 143 (H) 70 - 99 mg/dL   Comment 1 Notify RN    Comment 2 Document in Chart   CBC     Status: Abnormal   Collection Time: 06/16/18  7:27 AM  Result Value Ref Range   WBC 10.1 4.0 - 10.5 K/uL   RBC 6.14 (H) 4.22 - 5.81 MIL/uL   Hemoglobin 14.8 13.0 - 17.0 g/dL   HCT 16.1 09.6 - 04.5 %   MCV 76.7 (L) 78.0 - 100.0 fL   MCH 24.1 (L) 26.0 - 34.0 pg   MCHC 31.4 30.0 - 36.0 g/dL   RDW 40.9 81.1 - 91.4 %   Platelets 375 150 - 400 K/uL  Basic metabolic panel     Status: Abnormal   Collection Time: 06/16/18  7:27 AM  Result Value Ref Range   Sodium 135 135 - 145 mmol/L   Potassium 3.3 (L) 3.5 - 5.1 mmol/L   Chloride 95 (L) 98 - 111 mmol/L   CO2 28 22 - 32 mmol/L   Glucose, Bld 148 (H) 70 - 99 mg/dL   BUN 20 8 - 23 mg/dL   Creatinine, Ser 7.82 0.61 - 1.24 mg/dL   Calcium 9.2 8.9 - 95.6 mg/dL   GFR calc non Af Amer >60 >60 mL/min   GFR calc Af Amer >60 >60 mL/min   Anion gap 12 5 - 15  Heparin level (unfractionated)     Status: None   Collection Time: 06/16/18  7:27 AM  Result Value Ref Range   Heparin Unfractionated 0.49 0.30 - 0.70 IU/mL   Ct Angio Head W Or Wo Contrast  Result Date: 06/14/2018 CLINICAL DATA:  62 y/o M; right leg numbness, dizziness, slurred speech. EXAM: CT ANGIOGRAPHY HEAD AND NECK TECHNIQUE: Multidetector CT imaging of the head and neck was performed using the standard protocol during bolus administration of intravenous contrast. Multiplanar CT image  reconstructions and MIPs were obtained to evaluate the vascular anatomy. Carotid stenosis measurements (when applicable) are obtained utilizing NASCET criteria, using the distal internal carotid diameter as the denominator. CONTRAST:  50mL ISOVUE-370 IOPAMIDOL (ISOVUE-370) INJECTION 76% COMPARISON:  06/14/2018 MRI of the head. 12/20/2017 MRI and MRA of the head. FINDINGS: CT HEAD FINDINGS Brain: Small chronic infarct in the left cerebellar hemisphere. Small areas of lucency in the right medial occipital lobe, right superior cerebellum, and right posterior thalamus corresponding to regions of acute infarction on MRI of the head. No evidence for new intracranial hemorrhage, stroke, or mass effect. No extra-axial collection,  hydrocephalus, or herniation. Vascular: As below. Skull: Normal. Negative for fracture or focal lesion. Sinuses: Imaged portions are clear. Orbits: No acute finding. Review of the MIP images confirms the above findings CTA NECK FINDINGS Aortic arch: Bovine variant branching. Imaged portion shows no evidence of aneurysm or dissection. No significant stenosis of the major arch vessel origins. Right carotid system: No evidence of dissection, stenosis (50% or greater) or occlusion. Minimal calcific atherosclerosis without stenosis. Left carotid system: No evidence of dissection, stenosis (50% or greater) or occlusion. Moderate carotid bifurcation mixed plaque without significant stenosis. Vertebral arteries: Left dominant. No evidence of dissection, stenosis (50% or greater) or occlusion. Skeleton: Mild cervical spondylosis. No high-grade bony canal stenosis. Other neck: Negative. Upper chest: Negative. Review of the MIP images confirms the above findings CTA HEAD FINDINGS Anterior circulation: No significant stenosis, proximal occlusion, aneurysm, or vascular malformation. Mild calcific atherosclerosis of carotid siphons. Posterior circulation: Right PCA occlusion at the distal P1 segment (series 10,  image 118). No additional occlusion, significant stenosis, or aneurysm in the posterior circulation. Venous sinuses: As permitted by contrast timing, patent. Anatomic variants: Anterior communicating artery and left posterior communicating arteries are present. No right posterior communicating artery identified, likely hypoplastic or absent. Delayed phase: No abnormal intracranial enhancement. Review of the MIP images confirms the above findings IMPRESSION: CTA head: 1. Right proximal PCA occlusion at distal P1 segment. Intermediate collateralization of right PCA distribution. 2. No additional intracranial large vessel occlusion. No aneurysm, significant stenosis, or vascular malformation. CTA neck: Patent carotid and vertebral arteries. No high-grade stenosis by NASCET criteria, dissection, or aneurysm. CT head: 1. Areas of hypoattenuation in right thalamus, right medial occipital lobe, and right superior cerebellum corresponding to acute infarcts on MRI of the brain. 2. No new intracranial hemorrhage, stroke, or focal mass effect. These results will be called to the ordering clinician or representative by the Radiologist Assistant, and communication documented in the PACS or zVision Dashboard. Electronically Signed   By: Mitzi Hansen M.D.   On: 06/14/2018 21:38   Dg Chest 2 View  Result Date: 06/14/2018 CLINICAL DATA:  Encounter for CVA. EXAM: CHEST - 2 VIEW COMPARISON:  July 03, 2013 FINDINGS: The heart size and mediastinal contours are within normal limits. Both lungs are clear. The visualized skeletal structures are unremarkable. IMPRESSION: No active cardiopulmonary disease. Electronically Signed   By: Gerome Sam III M.D   On: 06/14/2018 17:20   Ct Head Wo Contrast  Result Date: 06/14/2018 CLINICAL DATA:  TIA. EXAM: CT HEAD WITHOUT CONTRAST TECHNIQUE: Contiguous axial images were obtained from the base of the skull through the vertex without intravenous contrast. COMPARISON:  MRI head  12/20/2017, CT head 12/19/2017 FINDINGS: Brain: Ill-defined hypodensity right occipital lobe compatible with acute infarct. This was not present previously. No associated hemorrhage Mild atrophy. Mild chronic microvascular ischemia in the white matter. Small chronic infarcts in the cerebellum bilaterally. Vascular: Negative for hyperdense vessel Skull: Negative Sinuses/Orbits: Negative Other: None IMPRESSION: Hypodensity right occipital lobe compatible with acute infarct. Negative for hemorrhage. These results were called by telephone at the time of interpretation on 06/14/2018 at 11:03 am to Dr. Linwood Dibbles , who verbally acknowledged these results. Electronically Signed   By: Marlan Palau M.D.   On: 06/14/2018 11:03   Ct Angio Neck W Or Wo Contrast  Result Date: 06/14/2018 CLINICAL DATA:  62 y/o M; right leg numbness, dizziness, slurred speech. EXAM: CT ANGIOGRAPHY HEAD AND NECK TECHNIQUE: Multidetector CT imaging of the head and neck  was performed using the standard protocol during bolus administration of intravenous contrast. Multiplanar CT image reconstructions and MIPs were obtained to evaluate the vascular anatomy. Carotid stenosis measurements (when applicable) are obtained utilizing NASCET criteria, using the distal internal carotid diameter as the denominator. CONTRAST:  74mL ISOVUE-370 IOPAMIDOL (ISOVUE-370) INJECTION 76% COMPARISON:  06/14/2018 MRI of the head. 12/20/2017 MRI and MRA of the head. FINDINGS: CT HEAD FINDINGS Brain: Small chronic infarct in the left cerebellar hemisphere. Small areas of lucency in the right medial occipital lobe, right superior cerebellum, and right posterior thalamus corresponding to regions of acute infarction on MRI of the head. No evidence for new intracranial hemorrhage, stroke, or mass effect. No extra-axial collection, hydrocephalus, or herniation. Vascular: As below. Skull: Normal. Negative for fracture or focal lesion. Sinuses: Imaged portions are clear. Orbits:  No acute finding. Review of the MIP images confirms the above findings CTA NECK FINDINGS Aortic arch: Bovine variant branching. Imaged portion shows no evidence of aneurysm or dissection. No significant stenosis of the major arch vessel origins. Right carotid system: No evidence of dissection, stenosis (50% or greater) or occlusion. Minimal calcific atherosclerosis without stenosis. Left carotid system: No evidence of dissection, stenosis (50% or greater) or occlusion. Moderate carotid bifurcation mixed plaque without significant stenosis. Vertebral arteries: Left dominant. No evidence of dissection, stenosis (50% or greater) or occlusion. Skeleton: Mild cervical spondylosis. No high-grade bony canal stenosis. Other neck: Negative. Upper chest: Negative. Review of the MIP images confirms the above findings CTA HEAD FINDINGS Anterior circulation: No significant stenosis, proximal occlusion, aneurysm, or vascular malformation. Mild calcific atherosclerosis of carotid siphons. Posterior circulation: Right PCA occlusion at the distal P1 segment (series 10, image 118). No additional occlusion, significant stenosis, or aneurysm in the posterior circulation. Venous sinuses: As permitted by contrast timing, patent. Anatomic variants: Anterior communicating artery and left posterior communicating arteries are present. No right posterior communicating artery identified, likely hypoplastic or absent. Delayed phase: No abnormal intracranial enhancement. Review of the MIP images confirms the above findings IMPRESSION: CTA head: 1. Right proximal PCA occlusion at distal P1 segment. Intermediate collateralization of right PCA distribution. 2. No additional intracranial large vessel occlusion. No aneurysm, significant stenosis, or vascular malformation. CTA neck: Patent carotid and vertebral arteries. No high-grade stenosis by NASCET criteria, dissection, or aneurysm. CT head: 1. Areas of hypoattenuation in right thalamus, right  medial occipital lobe, and right superior cerebellum corresponding to acute infarcts on MRI of the brain. 2. No new intracranial hemorrhage, stroke, or focal mass effect. These results will be called to the ordering clinician or representative by the Radiologist Assistant, and communication documented in the PACS or zVision Dashboard. Electronically Signed   By: Mitzi Hansen M.D.   On: 06/14/2018 21:38   Mr Brain Wo Contrast  Result Date: 06/14/2018 CLINICAL DATA:  62 year old male with recurrent right leg numbness since yesterday. Evidence of right PCA territory infarct on noncontrast head CT earlier today. EXAM: MRI HEAD WITHOUT CONTRAST TECHNIQUE: Multiplanar, multiecho pulse sequences of the brain and surrounding structures were obtained without intravenous contrast. COMPARISON:  Head CT without contrast 1030 hours today. Brain MRI 12/20/2017. FINDINGS: Brain: Confluent 4-5 centimeter area of restricted diffusion throughout the medial right occipital lobe tracking toward the tail of the right hippocampus (series 4, image 19). Associated patchy and nodular restricted diffusion in the right thalamus. There is also a superimposed 2 centimeter linear area of restricted diffusion in the right superior cerebellar artery territory. Furthermore, there is a punctate solitary focus of  restricted diffusion in the contralateral left occipital subcortical white matter (series 4, image 23). Early T2 and FLAIR hyperintensity in the involved areas with no associated hemorrhage or mass effect. Underlying chronic left cerebellar PICA territory infarct. Largely normal for age gray and white matter signal elsewhere. No chronic cerebral cortical encephalomalacia. No chronic cerebral blood products. No midline shift, mass effect, evidence of mass lesion, ventriculomegaly, extra-axial collection or acute intracranial hemorrhage. Cervicomedullary junction and pituitary are within normal limits. Vascular: Major  intracranial vascular flow voids are stable since January and preserved. Skull and upper cervical spine: Stable and normal bone marrow signal. Negative visible cervical spine. Sinuses/Orbits: Stable and negative. Other: Trace fluid in posterior most right mastoid air cells has developed since January. The remaining bilateral mastoids remain clear. Negative nasopharynx. Scalp and face soft tissues appear negative. IMPRESSION: 1. Acute infarcts in both the Right PCA and Right SCA territories with no associated hemorrhage or mass effect. 2. Superimposed punctate acute infarct also in Left PCA territory white matter. 3. This pattern might indicate a recent embolic shower to the posterior circulation, however, there is an underlying chronic Left PICA territory infarct. Electronically Signed   By: Odessa Fleming M.D.   On: 06/14/2018 13:37   Dg Abd Portable 2v  Result Date: 06/15/2018 CLINICAL DATA:  Nausea and vomiting. EXAM: PORTABLE ABDOMEN - 2 VIEW COMPARISON:  07/03/2013 FINDINGS: The bowel gas pattern is normal. No obstruction or significant ileus identified. There is no evidence of free air. No radio-opaque calculi or other significant radiographic abnormality is seen. The lumbar spine demonstrates degenerative disc disease. IMPRESSION: Negative. Electronically Signed   By: Irish Lack M.D.   On: 06/15/2018 15:09    Assessment/Plan: Diagnosis: bilateral posterior circulation infarcts with subsequent balance and gait deficits 1. Does the need for close, 24 hr/day medical supervision in concert with the patient's rehab needs make it unreasonable for this patient to be served in a less intensive setting? Yes 2. Co-Morbidities requiring supervision/potential complications: CAD, HTN, previous CVA 3. Due to bladder management, bowel management, safety, skin/wound care, disease management, medication administration, pain management and patient education, does the patient require 24 hr/day rehab nursing?  Yes 4. Does the patient require coordinated care of a physician, rehab nurse, PT (1-2 hrs/day, 5 days/week) and OT (1-2 hrs/day, 5 days/week) to address physical and functional deficits in the context of the above medical diagnosis(es)? Yes Addressing deficits in the following areas: balance, endurance, locomotion, strength, transferring, bowel/bladder control, bathing, dressing, feeding, grooming, toileting and psychosocial support 5. Can the patient actively participate in an intensive therapy program of at least 3 hrs of therapy per day at least 5 days per week? Yes 6. The potential for patient to make measurable gains while on inpatient rehab is excellent 7. Anticipated functional outcomes upon discharge from inpatient rehab are modified independent  with PT, modified independent with OT, n/a with SLP. 8. Estimated rehab length of stay to reach the above functional goals is: 8-13 days 9. Anticipated D/C setting: Home 10. Anticipated post D/C treatments: HH therapy and Outpatient therapy 11. Overall Rehab/Functional Prognosis: excellent  RECOMMENDATIONS: This patient's condition is appropriate for continued rehabilitative care in the following setting: CIR Patient has agreed to participate in recommended program. Yes Note that insurance prior authorization may be required for reimbursement for recommended care.  Comment: Rehab Admissions Coordinator to follow up.  Thanks,  Ranelle Oyster, MD, [place] [facility]  I have personally performed a face to face diagnostic evaluation of this patient.  Additionally, I have reviewed and concur with the physician assistant's documentation above.    Ranelle Oyster, MD 06/16/2018

## 2018-06-16 NOTE — Progress Notes (Addendum)
STROKE TEAM PROGRESS NOTE   SUBJECTIVE (INTERVAL HISTORY) Niece and nephew are at the bedside. Pt is lying in bed, awake alert and conversing well. INR 1.85 and still on heparin IV.    OBJECTIVE Temp:  [97.7 F (36.5 C)-98.3 F (36.8 C)] 98.1 F (36.7 C) (07/14 0752) Pulse Rate:  [87-101] 87 (07/14 1159) Cardiac Rhythm: Normal sinus rhythm;Bundle branch block (07/14 0700) Resp:  [17-22] 22 (07/14 0435) BP: (110-160)/(81-108) 110/81 (07/14 1159) SpO2:  [97 %-100 %] 98 % (07/14 0435)  CBC:  Recent Labs  Lab 06/14/18 1018 06/16/18 0727  WBC 10.9* 10.1  NEUTROABS 7.8*  --   HGB 15.5 14.8  HCT 46.0 47.1  MCV 75.8* 76.7*  PLT 387 375    Basic Metabolic Panel:  Recent Labs  Lab 06/14/18 1018 06/16/18 0727  NA 138 135  K 4.1 3.3*  CL 96* 95*  CO2 23 28  GLUCOSE 172* 148*  BUN 30* 20  CREATININE 1.21 1.03  CALCIUM 9.7 9.2    Lipid Panel:     Component Value Date/Time   CHOL 238 (H) 06/15/2018 0356   TRIG 137 06/15/2018 0356   HDL 29 (L) 06/15/2018 0356   CHOLHDL 8.2 06/15/2018 0356   VLDL 27 06/15/2018 0356   LDLCALC 182 (H) 06/15/2018 0356   HgbA1c:  Lab Results  Component Value Date   HGBA1C 6.8 (H) 06/15/2018   Urine Drug Screen:     Component Value Date/Time   LABOPIA NONE DETECTED 06/14/2018 1727   COCAINSCRNUR NONE DETECTED 06/14/2018 1727   LABBENZ NONE DETECTED 06/14/2018 1727   AMPHETMU NONE DETECTED 06/14/2018 1727   THCU NONE DETECTED 06/14/2018 1727   LABBARB (A) 06/14/2018 1727    Result not available. Reagent lot number recalled by manufacturer.    Alcohol Level     Component Value Date/Time   ETH <10 06/14/2018 1018    IMAGING I have personally reviewed the radiological images below and agree with the radiology interpretations.  Ct Angio Head W Or Wo Contrast Ct Angio Neck W Or Wo Contrast 06/14/2018 CTA head:  1. Right proximal PCA occlusion at distal P1 segment. Intermediate collateralization of right PCA distribution.  2. No  additional intracranial large vessel occlusion. No aneurysm, significant stenosis, or vascular malformation.   CTA neck:  Patent carotid and vertebral arteries. No high-grade stenosis by NASCET criteria, dissection, or aneurysm.   CT head:  1. Areas of hypoattenuation in right thalamus, right medial occipital lobe, and right superior cerebellum corresponding to acute infarcts on MRI of the brain.  2. No new intracranial hemorrhage, stroke, or focal mass effect.   Ct Head Wo Contrast 06/14/2018 IMPRESSION:  Hypodensity right occipital lobe compatible with acute infarct. Negative for hemorrhage.   Mr Brain Wo Contrast 06/14/2018 IMPRESSION:  1. Acute infarcts in both the Right PCA and Right SCA territories with no associated hemorrhage or mass effect.  2. Superimposed punctate acute infarct also in Left PCA territory white matter.  3. This pattern might indicate a recent embolic shower to the posterior circulation, however, there is an underlying chronic Left PICA territory infarct.   Transthoracic Echocardiogram - Left ventricle: The cavity size was moderately dilated. Wall   thickness was normal. The estimated ejection fraction was 20%.   Diffuse hypokinesis. There is akinesis of the anteroseptal and   anterior myocardium. Indeterminate diastolic function. Definity   contrast shows swirling contrast and loosely organized thrombotic   material at the LV apex. - Aortic valve: Mildly calcified  annulus. Trileaflet. - Mitral valve: There was mild regurgitation. - Right atrium: Central venous pressure (est): 3 mm Hg. - Atrial septum: No defect or patent foramen ovale was identified. - Tricuspid valve: There was trivial regurgitation. - Pulmonary arteries: Systolic pressure could not be accurately   estimated. - Pericardium, extracardiac: There was no pericardial effusion.    PHYSICAL EXAM  Temp:  [97.7 F (36.5 C)-98.3 F (36.8 C)] 98.1 F (36.7 C) (07/14 0752) Pulse Rate:   [87-101] 87 (07/14 1159) Resp:  [17-22] 22 (07/14 0435) BP: (110-160)/(81-108) 110/81 (07/14 1159) SpO2:  [97 %-100 %] 98 % (07/14 0435)  General - Well nourished, well developed, in no apparent distress, mild lethargy.  Ophthalmologic - fundi not visualized due to noncooperation.  Cardiovascular - Regular rate and rhythm.  Mental Status -  Level of arousal and orientation to time, place, and person were intact. Mild lethargy Language including expression, naming, repetition, comprehension was assessed and found intact.  Cranial Nerves II - XII - II - left upper quadrantanopia, left lower quadrant decreased visual acuity with simutagnosia. III, IV, VI - Extraocular movements intact. V - Facial sensation intact bilaterally. VII - left nasolabial fold flattening. VIII - Hearing & vestibular intact bilaterally. X - Palate elevates symmetrically. XI - Chin turning & shoulder shrug intact bilaterally. XII - Tongue protrusion intact.  Motor Strength - The patient's strength was normal in RUE and RLE, LUE 3/5 deltoid, 4/5 bicep and tricep, 5-/5 hand grip, LLE 4/5 proximal and 5/5 distal and pronator drift was present on the left.  Bulk was normal and fasciculations were absent.   Motor Tone - Muscle tone was assessed at the neck and appendages and was normal.  Reflexes - The patient's reflexes were symmetrical in all extremities and he had no pathological reflexes.  Sensory - Light touch, temperature/pinprick were assessed and were symmetrical.    Coordination - The patient had normal movements in right hand and right foot with no ataxia or dysmetria.  Tremor was absent.  Gait and Station - deferred.   ASSESSMENT/PLAN [name] is a 62 y.o. male with history of a previous CVA, coronary artery disease with stent, hypertension, chronic CHF with ejection fraction of 20 to 25% on warfarin therapy (but stopped due to recent surgery, INR 1.16 on admission) and diabetes mellitus  presenting with dizziness. He did not receive IV t-PA due to unclear time of onset.  Stroke: Multiple infarcts including right PCA, right SCA, left parieto-occipital punctate infarcts, consistent with an embolic source - likely low ejection fraction with subtherapeutic INR  Resultant left hemianopia, left hemiparesis  CT head - Hypodensity right occipital lobe compatible with acute infarct.  MRI head - Acute infarcts in both the Right PCA and Right SCA territories. Superimposed punctate acute infarct also in Left PCA territory white matter.  CTA H&N - right P1 occlusion with intermittent collateralization   2D Echo - EF 20% with possible apical thrombus  LDL - 182  HgbA1c - 6.8  VTE prophylaxis - warfarin  warfarin daily prior to admission, now on warfarin daily and heparin IV bridge. Once INR 2-3, heparin IV can be stopped.  Patient counseled to be compliant with his antithrombotic medications  Ongoing aggressive stroke risk factor management  Therapy recommendations:  CIR  Disposition:  Pending  Cardiomyopathy, chronic  12/2017 EF 20-25%  Put on coumadin with INR goal 2-3  Following with Dr. Ladona Ridgel   This time repeat EF 20%  INR subtherapeutic, on heparin  IV bridge.   Hx of stroke  12/2017 right CR infarct and MRA showed right M2 high grade stenosis  CUS neg, EF 20-25%, LDL 88 and A1C 6.5. UDS neg.   Put on ASA and plavix  12/28/17 30 day cardiac event monitoring neg for afib.  Hypertension  Stable . Long-term BP goal normotensive  Hyperlipidemia  Lipid lowering medication PTA: none  LDL 182, goal < 70  Current lipid lowering medication: crestor 40  Continue statin at discharge  Diabetes  HgbA1c 6.8, goal < 7.0  Controlled  SSI  CBG monitoring  Other Stroke Risk Factors  Advanced age  Former cigarette smoker - quit  ETOH use, advised to drink no more than 1 alcoholic beverage per day.  Family hx of stroke (father)  Coronary artery  disease  Other Active Problems  Elevated TG   Mild hypokalemia - 3.3 -> supplemented  Heparin / coumadin per pharmacy - INR 1.85  UTI - day #3 PO Keflex   Hospital day # 2  Neurology will sign off. Please call with questions. Pt will follow up with stroke clinic NP at [facility] in about 4 weeks. Thanks for the consult.  Marvel Plan, MD PhD Stroke Neurology 06/16/2018 10:01 PM   To contact Stroke Continuity provider, please refer to [email or web address]. After hours, contact General Neurology

## 2018-06-16 NOTE — Progress Notes (Addendum)
  Speech Language Pathology Treatment: Dysphagia  Patient Details Name: Karl [name] MRN: [id number] DOB: 10/24/1956 Today's Date: 06/16/2018 Time: 1779-3903 SLP Time Calculation (min) (ACUTE ONLY): 15 min  Assessment / Plan / Recommendation Clinical Impression  Pt with much improved tolerance of po today, denies sensation of residuals in pharynx and does not have any signs of airway compromise with intake.  MBS unable to be done today per radiology.    Recommend advance diet to dys3/thin or full liquids dependent on MD desire.  Pt admits to delayed issues with nausea/vomiting with solids prior to admit and there was not an adequate time delay prior to end of SLP session.   Advised pt to aspiration precautions and possible dietary plan.    Will proceed with MBS next date however to rule out pharyngeal deficit given pt's hoarseness and cva.  Pt and RN agreeable to plan.    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  Pt with left hemianopsia and left hemiparesis.  Had issues with n/v prior to admit but he denies further episodes.        SLP Plan  Continue with current plan of care       Recommendations  Diet recommendations: Dysphagia 3 (mechanical soft);Thin liquid(or full liquids) Liquids provided via: Straw Medication Administration: Via alternative means(preferably via alternative means) Supervision: Patient able to self feed Compensations: Small sips/bites;Slow rate Postural Changes and/or Swallow Maneuvers: Seated upright 90 degrees;Upright 30-60 min after meal                Oral Care Recommendations: Oral care BID SLP Visit Diagnosis: Dysphagia, unspecified (R13.10) Plan: Continue with current plan of care       GO                Chales Abrahams 06/16/2018, 8:58 AM   Donavan Burnet, MS [facility] SLP [phone]

## 2018-06-16 NOTE — Progress Notes (Addendum)
ANTICOAGULATION CONSULT NOTE - Follow Up Consult  Pharmacy Consult for Heparin and Coumadin Indication: stroke  Allergies  Allergen Reactions  . Lipitor [Atorvastatin] Nausea And Vomiting    Patient Measurements: Height: 6\' 2"  (188 cm) Weight: 232 lb (105.2 kg) IBW/kg (Calculated) : 82.2  Vital Signs: Temp: 98.1 F (36.7 C) (07/14 0752) Temp Source: Oral (07/14 0752) BP: 127/87 (07/14 0752) Pulse Rate: 95 (07/14 0435)  Labs: Recent Labs    06/14/18 1018 06/15/18 0356 06/16/18 0727  HGB 15.5  --  14.8  HCT 46.0  --  47.1  PLT 387  --  375  APTT 26  --   --   LABPROT 14.7 18.0* 21.1*  INR 1.16 1.50 1.85  HEPARINUNFRC  --   --  0.49  CREATININE 1.21  --  1.03    Estimated Creatinine Clearance: 94.9 mL/min (by C-G formula based on SCr of 1.03 mg/dL).   Medications:  Medications Prior to Admission  Medication Sig Dispense Refill Last Dose  . carvedilol (COREG) 12.5 MG tablet TAKE 1 & 1/2 (ONE & ONE-HALF) TABLETS BY MOUTH TWICE DAILY 270 tablet 2 06/13/2018 at 11:00am  . cephALEXin (KEFLEX) 500 MG capsule Take 1 capsule (500 mg total) by mouth 3 (three) times daily for 5 days. 15 capsule 0 06/13/2018 at Unknown time  . dapagliflozin propanediol (FARXIGA) 5 MG TABS tablet Take 5 mg by mouth daily. 30 tablet 5 06/13/2018 at Unknown time  . enalapril (VASOTEC) 5 MG tablet TAKE 1 TABLET BY MOUTH TWICE DAILY (Patient taking differently: TAKE 1 TABLET (5mg ) BY MOUTH TWICE DAILY) 60 tablet 11 06/13/2018 at Unknown time  . finasteride (PROSCAR) 5 MG tablet Take 5 mg by mouth daily.   06/13/2018 at Unknown time  . HYDROcodone-acetaminophen (NORCO/VICODIN) 5-325 MG tablet Take 1 tablet by mouth every 4 (four) hours as needed for moderate pain. 12 tablet 0 Past Week at Unknown time  . metFORMIN (GLUCOPHAGE) 1000 MG tablet TAKE 1 TABLET BY MOUTH TWICE DAILY WITH A MEAL (Patient taking differently: TAKE 1 TABLET (1000mg ) BY MOUTH TWICE DAILY WITH A MEAL) 90 tablet 1 06/13/2018 at Unknown  time  . ondansetron (ZOFRAN ODT) 4 MG disintegrating tablet 4mg  ODT q4 hours prn nausea/vomit (Patient taking differently: Take 4 mg by mouth every 4 (four) hours as needed for nausea or vomiting. 4mg  ODT q4 hours prn nausea/vomit) 10 tablet 0 06/13/2018 at Unknown time  . tamsulosin (FLOMAX) 0.4 MG CAPS capsule Take 1 capsule (0.4 mg total) by mouth daily after supper. 90 capsule 1 06/13/2018 at Unknown time  . warfarin (COUMADIN) 10 MG tablet Take 10 mg by mouth daily.   06/14/2018 at 1:00am    Assessment: 62 yo M on Coumadin PTA for hx CVA.  Coumadin was held for a scheduled procedure on 7/3.  Coumadin was restarted outpt post-procedure but INR remains subtherapeutic.  Pt presented to [facility] ED with RLE numbness, dizziness, and speech deficits and noted to have 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 PMH includes CAD, DM, HTN, CVA. Pharmacy consulted to dose warfarin and heparin bridge.   Coumadin 10mg  PO daily PTA  INR 1.85; heparin level 0.49   Goal of Therapy:  INR 2-3  Heparin goal 0.3-0.5 (lower goal) Monitor platelets by anticoagulation protocol: Yes   Plan:  Continue heparin to 1300 units/hr. Continue heparin until INR>2. Coumadin 10mg  PO x 1 tonight. Will obtain additional heparin level at 1400 for confirmation.  Monitor daily INR, heparin level, CBC, and signs  of bleeding   Gwynneth Albright, Ilda Basset D PGY1 Pharmacy Resident  Phone [phone] 06/16/2018   11:49 AM   I discussed / reviewed the pharmacy note by Dr. Norwood Levo and I agree with the resident's findings and plans as documented.  [employer], Pharm.D., BCPS Clinical Pharmacist Pager: [phone] Clinical phone for 06/16/2018 from 8:30-4:00 is [phone].  **Pharmacist phone directory can now be found on [email or web address] ([email or web address]).  Listed under [facility].  06/16/2018 1:44 PM  Addendum: Heparin level ordered for 2pm for confirmation was 0.68.  This is above goal of 0.3-0.5 given recent CVA.  Will reduce  heparin infusion rate.  Plan: Reduce heparin to 1150 units/hr. Next heparin level with AM labs.  [employer], Pharm.D., BCPS Clinical Pharmacist Pager: [phone] Clinical phone for 06/16/2018 from 8:30-4:00 is [phone].  **Pharmacist phone directory can now be found on [email or web address] ([email or web address]).  Listed under [facility].  06/16/2018 3:01 PM

## 2018-06-16 NOTE — Progress Notes (Signed)
[facility] PROGRESS NOTE  Karl [name] ZOX:[id number] DOB: 29-May-1956 DOA: 06/14/2018  PCP: Pincus Sanes, MD  Brief History/Interval Summary: 62 year old African-American male with a past medical history of the artery disease, diabetes mellitus, hypertension, history of stroke earlier this year for which he was prescribed warfarin, BPH who recently underwent a TURP procedure on July 3.  His warfarin was discontinued for this surgery.  He has a Foley catheter in place.  Presented with complains of dizziness and being unsteady on his feet.  Initial evaluation raise concern for acute stroke.  Patient was hospitalized for further management.  Reason for Visit: Acute stroke  Consultants: Neurology  Procedures:  Transthoracic echocardiogram Study Conclusions  - Left ventricle: The cavity size was moderately dilated. Wall   thickness was normal. The estimated ejection fraction was 20%.   Diffuse hypokinesis. There is akinesis of the anteroseptal and   anterior myocardium. Indeterminate diastolic function. Definity   contrast shows swirling contrast and loosely organized thrombotic   material at the LV apex. - Aortic valve: Mildly calcified annulus. Trileaflet. - Mitral valve: There was mild regurgitation. - Right atrium: Central venous pressure (est): 3 mm Hg. - Atrial septum: No defect or patent foramen ovale was identified. - Tricuspid valve: There was trivial regurgitation. - Pulmonary arteries: Systolic pressure could not be accurately   estimated. - Pericardium, extracardiac: There was no pericardial effusion.  Antibiotics: Keflex  Subjective/Interval History: Patient states that he is feeling better this morning.  Has not had any further episodes of nausea vomiting since yesterday afternoon.  Denies any abdominal pain.  Overall he feels better.    ROS: Denies any shortness of breath  Objective:  Vital Signs  Vitals:   06/15/18 2236 06/16/18 0051 06/16/18 0435  06/16/18 0752  BP: (!) 149/102 (!) 140/97 (!) 140/95 127/87  Pulse:  94 95   Resp:  (!) 21 (!) 22   Temp: 98 F (36.7 C)  98.3 F (36.8 C) 98.1 F (36.7 C)  TempSrc: Oral  Oral Oral  SpO2:  97% 98%   Weight:      Height:        Intake/Output Summary (Last 24 hours) at 06/16/2018 1048 Last data filed at 06/16/2018 1000 Gross per 24 hour  Intake 162.2 ml  Output 1100 ml  Net -937.8 ml   Filed Weights   06/14/18 0958  Weight: 105.2 kg (232 lb)    General appearance: Awake alert.  No distress Resp: Clear to auscultation bilaterally.  Normal effort.  No wheezing rales or rhonchi. Cardio: S1-S2 is normal regular.  No S3-S4.  No rubs murmurs or bruit GI: Abdomen is soft.  Nontender nondistended.  Bowel sounds are present.  No masses organomegaly Foley catheter is noted Extremities: No edema Neurologic: Alert and oriented x3.  Left-sided pronator drift.  Subtle weakness in the left upper extremity.     Lab Results:  Data Reviewed: I have personally reviewed following labs and imaging studies  CBC: Recent Labs  Lab 06/11/18 1606 06/14/18 1018 06/16/18 0727  WBC 11.0* 10.9* 10.1  NEUTROABS  --  7.8*  --   HGB 14.9 15.5 14.8  HCT 44.2 46.0 47.1  MCV 75.8* 75.8* 76.7*  PLT 398 387 375    Basic Metabolic Panel: Recent Labs  Lab 06/11/18 1606 06/14/18 1018 06/16/18 0727  NA 139 138 135  K 4.3 4.1 3.3*  CL 101 96* 95*  CO2 21* 23 28  GLUCOSE 166* 172* 148*  BUN 21  30* 20  CREATININE 1.10 1.21 1.03  CALCIUM 10.0 9.7 9.2    GFR: Estimated Creatinine Clearance: 94.9 mL/min (by C-G formula based on SCr of 1.03 mg/dL).  Liver Function Tests: Recent Labs  Lab 06/11/18 1606 06/14/18 1018  AST 15 13*  ALT 21 17  ALKPHOS 68 65  BILITOT 0.9 1.2  PROT 9.2* 8.8*  ALBUMIN 3.7 3.7    Recent Labs  Lab 06/11/18 1606  LIPASE 20    Coagulation Profile: Recent Labs  Lab 06/14/18 1018 06/15/18 0356 06/16/18 0727  INR 1.16 1.50 1.85    HbA1C: Recent Labs     06/15/18 0356  HGBA1C 6.8*    CBG: Recent Labs  Lab 06/15/18 0710 06/15/18 1150 06/15/18 1631 06/15/18 2240 06/16/18 0613  GLUCAP 174* 183* 145* 140* 143*    Lipid Profile: Recent Labs    06/15/18 0356  CHOL 238*  HDL 29*  LDLCALC 182*  TRIG 137  CHOLHDL 8.2     Recent Results (from the past 240 hour(s))  Urine culture     Status: Abnormal   Collection Time: 06/11/18  3:59 PM  Result Value Ref Range Status   Specimen Description   Final    URINE, CATHETERIZED Performed at [facility], [address] [place], [place] [address]    Special Requests   Final    NONE Performed at [facility], [address] [place], [place] [address]    Culture (A)  Final    >=100,000 COLONIES/mL KLEBSIELLA PNEUMONIAE >=100,000 COLONIES/mL ENTEROCOCCUS FAECALIS    Report Status 06/14/2018 FINAL  Final   Organism ID, Bacteria KLEBSIELLA PNEUMONIAE (A)  Final   Organism ID, Bacteria ENTEROCOCCUS FAECALIS (A)  Final      Susceptibility   Enterococcus faecalis - MIC*    AMPICILLIN <=2 SENSITIVE Sensitive     LEVOFLOXACIN 1 SENSITIVE Sensitive     NITROFURANTOIN <=16 SENSITIVE Sensitive     VANCOMYCIN 1 SENSITIVE Sensitive     * >=100,000 COLONIES/mL ENTEROCOCCUS FAECALIS   Klebsiella pneumoniae - MIC*    AMPICILLIN RESISTANT Resistant     CEFAZOLIN <=4 SENSITIVE Sensitive     CEFTRIAXONE <=1 SENSITIVE Sensitive     CIPROFLOXACIN <=0.25 SENSITIVE Sensitive     GENTAMICIN <=1 SENSITIVE Sensitive     IMIPENEM <=0.25 SENSITIVE Sensitive     NITROFURANTOIN <=16 SENSITIVE Sensitive     TRIMETH/SULFA <=20 SENSITIVE Sensitive     AMPICILLIN/SULBACTAM 4 SENSITIVE Sensitive     PIP/TAZO <=4 SENSITIVE Sensitive     Extended ESBL NEGATIVE Sensitive     * >=100,000 COLONIES/mL KLEBSIELLA PNEUMONIAE      Radiology Studies: Ct Angio Head W Or Wo Contrast  Result Date: 06/14/2018 CLINICAL DATA:  62 y/o M; right leg numbness, dizziness,  slurred speech. EXAM: CT ANGIOGRAPHY HEAD AND NECK TECHNIQUE: Multidetector CT imaging of the head and neck was performed using the standard protocol during bolus administration of intravenous contrast. Multiplanar CT image reconstructions and MIPs were obtained to evaluate the vascular anatomy. Carotid stenosis measurements (when applicable) are obtained utilizing NASCET criteria, using the distal internal carotid diameter as the denominator. CONTRAST:  50mL ISOVUE-370 IOPAMIDOL (ISOVUE-370) INJECTION 76% COMPARISON:  06/14/2018 MRI of the head. 12/20/2017 MRI and MRA of the head. FINDINGS: CT HEAD FINDINGS Brain: Small chronic infarct in the left cerebellar hemisphere. Small areas of lucency in the right medial occipital lobe, right superior cerebellum, and right posterior thalamus corresponding to regions of acute infarction on MRI of  the head. No evidence for new intracranial hemorrhage, stroke, or mass effect. No extra-axial collection, hydrocephalus, or herniation. Vascular: As below. Skull: Normal. Negative for fracture or focal lesion. Sinuses: Imaged portions are clear. Orbits: No acute finding. Review of the MIP images confirms the above findings CTA NECK FINDINGS Aortic arch: Bovine variant branching. Imaged portion shows no evidence of aneurysm or dissection. No significant stenosis of the major arch vessel origins. Right carotid system: No evidence of dissection, stenosis (50% or greater) or occlusion. Minimal calcific atherosclerosis without stenosis. Left carotid system: No evidence of dissection, stenosis (50% or greater) or occlusion. Moderate carotid bifurcation mixed plaque without significant stenosis. Vertebral arteries: Left dominant. No evidence of dissection, stenosis (50% or greater) or occlusion. Skeleton: Mild cervical spondylosis. No high-grade bony canal stenosis. Other neck: Negative. Upper chest: Negative. Review of the MIP images confirms the above findings CTA HEAD FINDINGS Anterior  circulation: No significant stenosis, proximal occlusion, aneurysm, or vascular malformation. Mild calcific atherosclerosis of carotid siphons. Posterior circulation: Right PCA occlusion at the distal P1 segment (series 10, image 118). No additional occlusion, significant stenosis, or aneurysm in the posterior circulation. Venous sinuses: As permitted by contrast timing, patent. Anatomic variants: Anterior communicating artery and left posterior communicating arteries are present. No right posterior communicating artery identified, likely hypoplastic or absent. Delayed phase: No abnormal intracranial enhancement. Review of the MIP images confirms the above findings IMPRESSION: CTA head: 1. Right proximal PCA occlusion at distal P1 segment. Intermediate collateralization of right PCA distribution. 2. No additional intracranial large vessel occlusion. No aneurysm, significant stenosis, or vascular malformation. CTA neck: Patent carotid and vertebral arteries. No high-grade stenosis by NASCET criteria, dissection, or aneurysm. CT head: 1. Areas of hypoattenuation in right thalamus, right medial occipital lobe, and right superior cerebellum corresponding to acute infarcts on MRI of the brain. 2. No new intracranial hemorrhage, stroke, or focal mass effect. These results will be called to the ordering clinician or representative by the Radiologist Assistant, and communication documented in the PACS or zVision Dashboard. Electronically Signed   By: Mitzi Hansen M.D.   On: 06/14/2018 21:38   Dg Chest 2 View  Result Date: 06/14/2018 CLINICAL DATA:  Encounter for CVA. EXAM: CHEST - 2 VIEW COMPARISON:  July 03, 2013 FINDINGS: The heart size and mediastinal contours are within normal limits. Both lungs are clear. The visualized skeletal structures are unremarkable. IMPRESSION: No active cardiopulmonary disease. Electronically Signed   By: Gerome Sam III M.D   On: 06/14/2018 17:20   Ct Angio Neck W Or Wo  Contrast  Result Date: 06/14/2018 CLINICAL DATA:  62 y/o M; right leg numbness, dizziness, slurred speech. EXAM: CT ANGIOGRAPHY HEAD AND NECK TECHNIQUE: Multidetector CT imaging of the head and neck was performed using the standard protocol during bolus administration of intravenous contrast. Multiplanar CT image reconstructions and MIPs were obtained to evaluate the vascular anatomy. Carotid stenosis measurements (when applicable) are obtained utilizing NASCET criteria, using the distal internal carotid diameter as the denominator. CONTRAST:  50mL ISOVUE-370 IOPAMIDOL (ISOVUE-370) INJECTION 76% COMPARISON:  06/14/2018 MRI of the head. 12/20/2017 MRI and MRA of the head. FINDINGS: CT HEAD FINDINGS Brain: Small chronic infarct in the left cerebellar hemisphere. Small areas of lucency in the right medial occipital lobe, right superior cerebellum, and right posterior thalamus corresponding to regions of acute infarction on MRI of the head. No evidence for new intracranial hemorrhage, stroke, or mass effect. No extra-axial collection, hydrocephalus, or herniation. Vascular: As below. Skull: Normal. Negative  for fracture or focal lesion. Sinuses: Imaged portions are clear. Orbits: No acute finding. Review of the MIP images confirms the above findings CTA NECK FINDINGS Aortic arch: Bovine variant branching. Imaged portion shows no evidence of aneurysm or dissection. No significant stenosis of the major arch vessel origins. Right carotid system: No evidence of dissection, stenosis (50% or greater) or occlusion. Minimal calcific atherosclerosis without stenosis. Left carotid system: No evidence of dissection, stenosis (50% or greater) or occlusion. Moderate carotid bifurcation mixed plaque without significant stenosis. Vertebral arteries: Left dominant. No evidence of dissection, stenosis (50% or greater) or occlusion. Skeleton: Mild cervical spondylosis. No high-grade bony canal stenosis. Other neck: Negative. Upper  chest: Negative. Review of the MIP images confirms the above findings CTA HEAD FINDINGS Anterior circulation: No significant stenosis, proximal occlusion, aneurysm, or vascular malformation. Mild calcific atherosclerosis of carotid siphons. Posterior circulation: Right PCA occlusion at the distal P1 segment (series 10, image 118). No additional occlusion, significant stenosis, or aneurysm in the posterior circulation. Venous sinuses: As permitted by contrast timing, patent. Anatomic variants: Anterior communicating artery and left posterior communicating arteries are present. No right posterior communicating artery identified, likely hypoplastic or absent. Delayed phase: No abnormal intracranial enhancement. Review of the MIP images confirms the above findings IMPRESSION: CTA head: 1. Right proximal PCA occlusion at distal P1 segment. Intermediate collateralization of right PCA distribution. 2. No additional intracranial large vessel occlusion. No aneurysm, significant stenosis, or vascular malformation. CTA neck: Patent carotid and vertebral arteries. No high-grade stenosis by NASCET criteria, dissection, or aneurysm. CT head: 1. Areas of hypoattenuation in right thalamus, right medial occipital lobe, and right superior cerebellum corresponding to acute infarcts on MRI of the brain. 2. No new intracranial hemorrhage, stroke, or focal mass effect. These results will be called to the ordering clinician or representative by the Radiologist Assistant, and communication documented in the PACS or zVision Dashboard. Electronically Signed   By: Mitzi Hansen M.D.   On: 06/14/2018 21:38   Mr Brain Wo Contrast  Result Date: 06/14/2018 CLINICAL DATA:  62 year old male with recurrent right leg numbness since yesterday. Evidence of right PCA territory infarct on noncontrast head CT earlier today. EXAM: MRI HEAD WITHOUT CONTRAST TECHNIQUE: Multiplanar, multiecho pulse sequences of the brain and surrounding  structures were obtained without intravenous contrast. COMPARISON:  Head CT without contrast 1030 hours today. Brain MRI 12/20/2017. FINDINGS: Brain: Confluent 4-5 centimeter area of restricted diffusion throughout the medial right occipital lobe tracking toward the tail of the right hippocampus (series 4, image 19). Associated patchy and nodular restricted diffusion in the right thalamus. There is also a superimposed 2 centimeter linear area of restricted diffusion in the right superior cerebellar artery territory. Furthermore, there is a punctate solitary focus of restricted diffusion in the contralateral left occipital subcortical white matter (series 4, image 23). Early T2 and FLAIR hyperintensity in the involved areas with no associated hemorrhage or mass effect. Underlying chronic left cerebellar PICA territory infarct. Largely normal for age gray and white matter signal elsewhere. No chronic cerebral cortical encephalomalacia. No chronic cerebral blood products. No midline shift, mass effect, evidence of mass lesion, ventriculomegaly, extra-axial collection or acute intracranial hemorrhage. Cervicomedullary junction and pituitary are within normal limits. Vascular: Major intracranial vascular flow voids are stable since January and preserved. Skull and upper cervical spine: Stable and normal bone marrow signal. Negative visible cervical spine. Sinuses/Orbits: Stable and negative. Other: Trace fluid in posterior most right mastoid air cells has developed since January. The remaining bilateral  mastoids remain clear. Negative nasopharynx. Scalp and face soft tissues appear negative. IMPRESSION: 1. Acute infarcts in both the Right PCA and Right SCA territories with no associated hemorrhage or mass effect. 2. Superimposed punctate acute infarct also in Left PCA territory white matter. 3. This pattern might indicate a recent embolic shower to the posterior circulation, however, there is an underlying chronic Left  PICA territory infarct. Electronically Signed   By: Odessa Fleming M.D.   On: 06/14/2018 13:37   Dg Abd Portable 2v  Result Date: 06/15/2018 CLINICAL DATA:  Nausea and vomiting. EXAM: PORTABLE ABDOMEN - 2 VIEW COMPARISON:  07/03/2013 FINDINGS: The bowel gas pattern is normal. No obstruction or significant ileus identified. There is no evidence of free air. No radio-opaque calculi or other significant radiographic abnormality is seen. The lumbar spine demonstrates degenerative disc disease. IMPRESSION: Negative. Electronically Signed   By: Irish Lack M.D.   On: 06/15/2018 15:09     Medications:  Scheduled: . aspirin  325 mg Oral Daily  . cephALEXin  500 mg Oral TID  . feeding supplement (ENSURE ENLIVE)  237 mL Oral BID BM  . finasteride  5 mg Oral Daily  . insulin aspart  0-9 Units Subcutaneous TID WC  . polyethylene glycol  17 g Oral Daily  . rosuvastatin  40 mg Oral q1800  . tamsulosin  0.4 mg Oral QPC supper  . Warfarin - Pharmacist Dosing Inpatient   Does not apply q1800   Continuous: . heparin 1,300 Units/hr (06/16/18 0113)   PPJ:[id number] **OR** acetaminophen (TYLENOL) oral liquid 160 mg/5 mL **OR** acetaminophen, HYDROcodone-acetaminophen, ondansetron, promethazine, senna-docusate, sodium phosphate  Assessment/Plan:    Acute embolic stroke Neurology is following.  Echocardiogram to suggest clot in the apex.  Patient started on IV heparin.  He remains on warfarin.  Await therapeutic INR.  Patient did undergo CT angiogram head and neck did not show any stenosis of the carotid arteries.  Other findings were noted to be addressed by neurology.  MRI brain report as above.  LDL 182.  HbA1c 6.8. Patient was previously on warfarin for history of embolic stroke.  However warfarin was held for his prostate surgery earlier this month.  However it was resumed but INR noted to be subtherapeutic.  Currently also on aspirin.  Seen by physical and Occupational Therapy.  CIR recommended.  They  have been consulted.. Further management per neurology.  Patient reportedly has intolerance to atorvastatin.  Unclear if other agents have been tried.  Started on Crestor.  Nausea and vomiting Etiology unclear.  Could be secondary to stroke.  Could also be due to constipation.  Patient was given symptomatic treatment.  He appears to have improved.  His UA is noted to be abnormal.  He did have positive urine cultures as discussed below.  He has on a course of Keflex which is being continued.  His symptoms recur then may have to transition him to IV antibiotics.  Abnormal UA with positive urine cultures Urine cultures from 7/9 was positive for Klebsiella as well as enterococcus.  Patient was prescribed a 5-day course of Keflex recently.  Sensitivities reviewed.  Klebsiella was sensitive to Keflex.  However enterococcus sensitive to Levaquin vancomycin and ampicillin.  WBC is only minimally elevated.  He is afebrile.  Patient has a indwelling Foley catheter.  His nausea vomiting could have been due to UTI but there are other reasons present as well.  Hold off on changing antibiotics for now.  History of BPH and urinary  retention status post recent TURP on 06/05/2018 Continue Flomax and Proscar.  Continue Foley catheter.  Followed by Dr. Alvester Morin with urology.  Foley will need to be kept until he has followed up with urology.  Essential hypertension Allowing permissive hypertension.  Blood pressure is reasonably well controlled.  Home medications including Coreg and enalapril on hold.  Resume his carvedilol.  Replace his potassium.  History of coronary artery disease Currently chest pain-free.  Continue to monitor.  Chronic systolic and diastolic CHF Patient with EF of 20%.  Currently euvolemic.  Strict ins and outs and daily weights.  Patient went to one episode of NSVT this morning.  He was asymptomatic.  Resume his carvedilol.  Patient has previously refused ICD placement.  Followed by Dr. Ladona Ridgel.  On  chronic anticoagulation This is for previous history of embolic stroke.  Patient without any other indication for anti-coagulation.  Warfarin had to be held for his surgery on 7/3.  It was resumed however INR noted to be subtherapeutic on admission.  Patient started on IV heparin due to presence of clot at the apex.  On warfarin as well.  Await therapeutic INR.  Diabetes mellitus type 2 Holding his oral agents.  SSI.  Monitor CBGs.  Constipation Stool softeners and laxatives as needed.  DVT Prophylaxis: On heparin and warfarin Code Status: Full code Family Communication: Discussed with the patient Disposition Plan: Management as outlined above.  Good candidate for CIR.    LOS: 2 days   Osvaldo Shipper  [facility] Pager [phone] 06/16/2018, 10:48 AM  If 7PM-7AM, please contact night-coverage at [email or web address], password [facility]

## 2018-06-17 ENCOUNTER — Inpatient Hospital Stay (HOSPITAL_COMMUNITY): Payer: Managed Care, Other (non HMO)

## 2018-06-17 DIAGNOSIS — I63431 Cerebral infarction due to embolism of right posterior cerebral artery: Secondary | ICD-10-CM

## 2018-06-17 LAB — GLUCOSE, CAPILLARY
GLUCOSE-CAPILLARY: 180 mg/dL — AB (ref 70–99)
GLUCOSE-CAPILLARY: 172 mg/dL — AB (ref 70–99)
GLUCOSE-CAPILLARY: 180 mg/dL — AB (ref 70–99)
Glucose-Capillary: 230 mg/dL — ABNORMAL HIGH (ref 70–99)

## 2018-06-17 LAB — PROTIME-INR
INR: 2.58
Prothrombin Time: 27.5 seconds — ABNORMAL HIGH (ref 11.4–15.2)

## 2018-06-17 LAB — CBC
HCT: 45.1 % (ref 39.0–52.0)
HEMOGLOBIN: 14.2 g/dL (ref 13.0–17.0)
MCH: 24.2 pg — ABNORMAL LOW (ref 26.0–34.0)
MCHC: 31.5 g/dL (ref 30.0–36.0)
MCV: 77 fL — ABNORMAL LOW (ref 78.0–100.0)
Platelets: 392 10*3/uL (ref 150–400)
RBC: 5.86 MIL/uL — AB (ref 4.22–5.81)
RDW: 14.1 % (ref 11.5–15.5)
WBC: 11.6 10*3/uL — AB (ref 4.0–10.5)

## 2018-06-17 LAB — BASIC METABOLIC PANEL
ANION GAP: 12 (ref 5–15)
BUN: 19 mg/dL (ref 8–23)
CALCIUM: 9.1 mg/dL (ref 8.9–10.3)
CO2: 26 mmol/L (ref 22–32)
Chloride: 96 mmol/L — ABNORMAL LOW (ref 98–111)
Creatinine, Ser: 1.08 mg/dL (ref 0.61–1.24)
GLUCOSE: 160 mg/dL — AB (ref 70–99)
Potassium: 3.3 mmol/L — ABNORMAL LOW (ref 3.5–5.1)
SODIUM: 134 mmol/L — AB (ref 135–145)

## 2018-06-17 LAB — MAGNESIUM: MAGNESIUM: 2.2 mg/dL (ref 1.7–2.4)

## 2018-06-17 LAB — HEPARIN LEVEL (UNFRACTIONATED): HEPARIN UNFRACTIONATED: 0.5 [IU]/mL (ref 0.30–0.70)

## 2018-06-17 MED ORDER — POTASSIUM CHLORIDE CRYS ER 20 MEQ PO TBCR
40.0000 meq | EXTENDED_RELEASE_TABLET | Freq: Once | ORAL | Status: AC
  Administered 2018-06-17: 40 meq via ORAL
  Filled 2018-06-17: qty 2

## 2018-06-17 MED ORDER — WARFARIN SODIUM 7.5 MG PO TABS
7.5000 mg | ORAL_TABLET | Freq: Once | ORAL | Status: AC
  Administered 2018-06-17: 7.5 mg via ORAL
  Filled 2018-06-17: qty 1

## 2018-06-17 MED ORDER — POTASSIUM CHLORIDE CRYS ER 20 MEQ PO TBCR
20.0000 meq | EXTENDED_RELEASE_TABLET | Freq: Once | ORAL | Status: AC
  Administered 2018-06-17: 20 meq via ORAL
  Filled 2018-06-17: qty 1

## 2018-06-17 NOTE — Clinical Social Work Note (Signed)
Clinical Social Work Assessment  Patient Details  Name: Karl [name] MRN: [id number] Date of Birth: September 14, 1956  Date of referral:  06/17/18               Reason for consult:  Facility Placement, Discharge Planning                Permission sought to share information with:  Chartered certified accountant granted to share information::  Yes, Verbal Permission Granted  Name::        Agency::  SNF's  Relationship::     Contact Information:     Housing/Transportation Living arrangements for the past 2 months:  Single Family Home Source of Information:  Patient, Medical Team Patient Interpreter Needed:  None Criminal Activity/Legal Involvement Pertinent to Current Situation/Hospitalization:  No - Comment as needed Significant Relationships:  Adult Children, Spouse, Other Family Members Lives with:  Adult Children Do you feel safe going back to the place where you live?  Yes Need for family participation in patient care:  Yes (Comment)  Care giving concerns:  PT recommending CIR once medically stable for discharge. CSW initiating SNF backup plan.   Social Worker assessment / plan:  CSW met with patient. No supports at bedside. CSW introduced role and explained that PT recommendations would be discussed. Discussed potential barriers to CIR. Patient agreeable to SNF if he cannot go to CIR. SNF list provided for review. No further concerns. CSW encouraged patient to contact CSW as needed. CSW will continue to follow patient for support and facilitate discharge to SNF, if needed, once medically stable.  Employment status:  [employer] information:  Other (Comment Required)([employer]) PT Recommendations:  Inpatient Rehab Consult Information / Referral to community resources:  [place]  Patient/Family's Response to care:  Patient agreeable to SNF if he cannot go to CIR. Patient's family supportive and involved in patient's care. Patient appreciated social work  intervention.  Patient/Family's Understanding of and Emotional Response to Diagnosis, Current Treatment, and Prognosis:  Patient has a good understanding of the reason for admission and his need for rehab prior to returning home. Patient appears happy with hospital care.  Emotional Assessment Appearance:  Appears stated age Attitude/Demeanor/Rapport:  Engaged, Gracious Affect (typically observed):  Accepting, Appropriate, Calm, Pleasant Orientation:  Oriented to Self, Oriented to Place, Oriented to  Time, Oriented to Situation Alcohol / Substance use:  Never Used Psych involvement (Current and /or in the community):  No (Comment)  Discharge Needs  Concerns to be addressed:  Care Coordination Readmission within the last 30 days:  Yes Current discharge risk:  Dependent with Mobility Barriers to Discharge:  S[employer] broker, Continued Medical Work up   Candie Chroman, LCSW 06/17/2018, 2:34 PM

## 2018-06-17 NOTE — [place] FL2 (Signed)
[place] MEDICAID FL2 LEVEL OF CARE SCREENING TOOL     IDENTIFICATION  Patient Name: Karl [name] Birthdate: 01-21-1956 Sex: male Admission Date (Current Location): 06/14/2018  [facility] and [place] Number:  Producer, television/film/video and Address:  [facility], [address], [place], [place] [address]      Provider Number: [id number]  Attending Physician Name and Address:  Osvaldo Shipper, MD  Relative Name and Phone Number:       Current Level of Care: Hospital Recommended Level of Care: Skilled Nursing Facility Prior Approval Number:    Date Approved/Denied:   PASRR Number: [id number]  Discharge Plan: SNF    Current Diagnoses: Patient Active Problem List   Diagnosis Date Noted  . Ischemic cardiomyopathy   . CVA (cerebral vascular accident) (HCC) 06/14/2018  . Urinary retention 06/05/2018  . Encounter for therapeutic drug monitoring 02/08/2018  . History of CVA (cerebrovascular accident) 12/20/2017  . Coronary artery disease 12/19/2017  . Mild renal insufficiency 12/19/2017  . TIA (transient ischemic attack) 12/19/2017  . Hip flexor tendinitis 05/22/2017  . Abscess of back 05/22/2017  . Pain of both hip joints 04/25/2017  . Dyslipidemia 09/01/2015  . BPH without urinary obstruction 09/01/2015  . DM (diabetes mellitus), type 2, uncontrolled, periph vascular complic (HCC) 07/10/2014  . Essential hypertension 04/20/2009  . CARDIOMYOPATHY, ISCHEMIC 04/20/2009  . LEFT BUNDLE BRANCH BLOCK 04/20/2009  . Chronic systolic heart failure (HCC) 04/20/2009    Orientation RESPIRATION BLADDER Height & Weight     Self, Time, Situation, Place  Normal Continent, Indwelling catheter Weight: 232 lb (105.2 kg) Height:  6\' 2"  (188 cm)  BEHAVIORAL SYMPTOMS/MOOD NEUROLOGICAL BOWEL NUTRITION STATUS  (None) (CVA) Continent Diet(DYS 3)  AMBULATORY STATUS COMMUNICATION OF NEEDS Skin   Extensive Assist Verbally Normal                       Personal Care  Assistance Level of Assistance  Bathing, Feeding, Dressing Bathing Assistance: Limited assistance Feeding assistance: Limited assistance Dressing Assistance: Limited assistance     Functional Limitations Info  Sight, Hearing, Speech Sight Info: Adequate Hearing Info: Adequate Speech Info: Adequate    SPECIAL CARE FACTORS FREQUENCY  PT (By licensed PT), Blood pressure, OT (By licensed OT), Speech therapy     PT Frequency: 5 x week OT Frequency: 5 x week     Speech Therapy Frequency: 5 x week      Contractures Contractures Info: Not present    Additional Factors Info  Code Status, Allergies Code Status Info: Full Allergies Info: Lipitor (Atorvastatin).           Current Medications (06/17/2018):  This is the current hospital active medication list Current Facility-Administered Medications  Medication Dose Route Frequency Provider Last Rate Last Dose  . acetaminophen (TYLENOL) tablet 650 mg  650 mg Oral Q4H PRN Edsel Petrin, DO   650 mg at 06/15/18 1025   Or  . acetaminophen (TYLENOL) solution 650 mg  650 mg Per Tube Q4H PRN Edsel Petrin, DO       Or  . acetaminophen (TYLENOL) suppository 650 mg  650 mg Rectal Q4H PRN Edsel Petrin, DO      . carvedilol (COREG) tablet 12.5 mg  12.5 mg Oral BID WC Osvaldo Shipper, MD   12.5 mg at 06/17/18 0825  . cephALEXin (KEFLEX) capsule 500 mg  500 mg Oral TID Edsel Petrin, DO   500 mg at 06/17/18 1010  . feeding supplement (  ENSURE ENLIVE) (ENSURE ENLIVE) liquid 237 mL  237 mL Oral BID BM Mikhail, Maryann, DO   237 mL at 06/17/18 1010  . finasteride (PROSCAR) tablet 5 mg  5 mg Oral Daily Edsel Petrin, DO   5 mg at 06/17/18 1010  . HYDROcodone-acetaminophen (NORCO/VICODIN) 5-325 MG per tablet 1 tablet  1 tablet Oral Q4H PRN Edsel Petrin, DO      . insulin aspart (novoLOG) injection 0-9 Units  0-9 Units Subcutaneous TID WC Edsel Petrin, DO   3 Units at 06/17/18 1233  . ondansetron (ZOFRAN-ODT) disintegrating  tablet 4 mg  4 mg Oral Q4H PRN Edsel Petrin, DO   4 mg at 06/15/18 1025  . polyethylene glycol (MIRALAX / GLYCOLAX) packet 17 g  17 g Oral Daily Osvaldo Shipper, MD   17 g at 06/17/18 1010  . potassium chloride SA (K-DUR,KLOR-CON) CR tablet 20 mEq  20 mEq Oral Once Osvaldo Shipper, MD      . promethazine (PHENERGAN) injection 12.5 mg  12.5 mg Intravenous Q6H PRN Osvaldo Shipper, MD      . rosuvastatin (CRESTOR) tablet 40 mg  40 mg Oral q1800 Marvel Plan, MD   40 mg at 06/16/18 1734  . senna-docusate (Senokot-S) tablet 1 tablet  1 tablet Oral QHS PRN Edsel Petrin, DO   1 tablet at 06/15/18 2242  . sodium phosphate (FLEET) 7-19 GM/118ML enema 1 enema  1 enema Rectal Daily PRN Osvaldo Shipper, MD      . tamsulosin [facility]) capsule 0.4 mg  0.4 mg Oral QPC supper Edsel Petrin, DO   0.4 mg at 06/16/18 1737  . warfarin (COUMADIN) tablet 7.5 mg  7.5 mg Oral ONCE-1800 Charmian Muff, RPH      . Warfarin - Pharmacist Dosing Inpatient   Does not apply q1800 Cindi Carbon, [facility]         Discharge Medications: Please see discharge summary for a list of discharge medications.  Relevant Imaging Results:  Relevant Lab Results:   Additional Information SS#: [id number]  Margarito Liner, LCSW

## 2018-06-17 NOTE — Progress Notes (Addendum)
Inpatient Rehabilitation  Met with patient at bedside to discuss team's recommendation for IP Rehab.  Shared booklets, insurance verification letter, and answered questions.  He is in agreement with plan for CIR for post acute rehab in order to maximize his functional independence.  Plan to initiate insurance authorization now that acute case is built and follow for timing of medical readiness, insurance approval, and IP Rehab bed availability.  Call if questions.    Carmelia Roller., CCC/SLP Admission Coordinator  [place]  Cell [phone]

## 2018-06-17 NOTE — Progress Notes (Signed)
Physical Therapy Treatment Patient Details Name: Karl [name] MRN: [id number] DOB: 1956/04/24 Today's Date: 06/17/2018    History of Present Illness This 62 y.o. male admitted with acute onsef ot dizziness, some Rt LE numbness, and intermittent diplopia.  MRI showed Acute infarcts in both the Right PCA and Right SCA territories    PT Comments    Patient is making progress toward PT goals. Pt requires mod/max A +2 for safe OOB transfers and short distance gait training. Pt continues to demonstrate L side inattention and heavy L lateral lean. Continue to recommend CIR for further skilled PT services to maximize independence and safety with mobility.   Follow Up Recommendations  CIR     Equipment Recommendations  (TBD)    Recommendations for Other Services Rehab consult     Precautions / Restrictions Precautions Precautions: Fall Precaution Comments: poor proprioceptive awareness of Lt  Restrictions Weight Bearing Restrictions: No    Mobility  Bed Mobility Overal bed mobility: Needs Assistance Bed Mobility: Supine to Sit     Supine to sit: Min guard     General bed mobility comments: min guard for safety; increased time and effort; HOB elevated and use of rail   Transfers Overall transfer level: Needs assistance Equipment used: Rolling walker (2 wheeled) Transfers: Sit to/from Stand Sit to Stand: Mod assist;Max assist         General transfer comment: cues for safe hand placement; assistance required to power up into standing and to gain balance upon standing; pt bracing himself with bilat LE against bed; L lateral lean  Ambulation/Gait Ambulation/Gait assistance: Max assist;+2 safety/equipment Gait Distance (Feet): 4 Feet Assistive device: Rolling walker (2 wheeled) Gait Pattern/deviations: Step-through pattern;Decreased step length - right;Ataxic(atatxic L LE movements)     General Gait Details: pt able to ambulate ~57ft with +2 assistance for balance, weight  shifting, and guiding RW; assistance required to for L knee extension during stance phase; heavy L lateral lean which pt is able to correct with cues    Stairs             Wheelchair Mobility    Modified Rankin (Stroke Patients Only) Modified Rankin (Stroke Patients Only) Pre-Morbid Rankin Score: No symptoms Modified Rankin: Moderately severe disability     Balance Overall balance assessment: Needs assistance Sitting-balance support: Bilateral upper extremity supported Sitting balance-Leahy Scale: Fair     Standing balance support: Bilateral upper extremity supported Standing balance-Leahy Scale: Poor                              Cognition Arousal/Alertness: Awake/alert Behavior During Therapy: Flat affect Overall Cognitive Status: Impaired/Different from baseline Area of Impairment: Attention;Following commands;Safety/judgement;Awareness;Memory                   Current Attention Level: Selective Memory: Decreased recall of precautions Following Commands: Follows one step commands with increased time Safety/Judgement: Decreased awareness of safety;Decreased awareness of deficits Awareness: Emergent Problem Solving: Slow processing;Decreased initiation        Exercises      General Comments        Pertinent Vitals/Pain Pain Assessment: No/denies pain    Home Living                      Prior Function            PT Goals (current goals can now be found in the care plan section) Acute  Rehab PT Goals PT Goal Formulation: With patient Time For Goal Achievement: 06/22/18 Potential to Achieve Goals: Good Progress towards PT goals: Progressing toward goals    Frequency    Min 4X/week      PT Plan Current plan remains appropriate    Co-evaluation              AM-PAC PT "6 Clicks" Daily Activity  Outcome Measure  Difficulty turning over in bed (including adjusting bedclothes, sheets and blankets)?:  Unable Difficulty moving from lying on back to sitting on the side of the bed? : Unable Difficulty sitting down on and standing up from a chair with arms (e.g., wheelchair, bedside commode, etc,.)?: Unable Help needed moving to and from a bed to chair (including a wheelchair)?: A Lot Help needed walking in hospital room?: A Lot Help needed climbing 3-5 steps with a railing? : Total 6 Click Score: 8    End of Session Equipment Utilized During Treatment: Gait belt Activity Tolerance: Patient tolerated treatment well Patient left: with call bell/phone within reach;in chair;with chair alarm set Nurse Communication: Mobility status PT Visit Diagnosis: Unsteadiness on feet (R26.81);Other abnormalities of gait and mobility (R26.89);Repeated falls (R29.6);Muscle weakness (generalized) (M62.81);Dizziness and giddiness (R42)     Time: 1610-9604 PT Time Calculation (min) (ACUTE ONLY): 23 min  Charges:  $Gait Training: 8-22 mins $Therapeutic Activity: 8-22 mins                    G Codes:       Erline Levine, PTA Pager: [phone]     Carolynne Edouard 06/17/2018, 1:26 PM

## 2018-06-17 NOTE — Progress Notes (Signed)
Occupational Therapy Treatment Patient Details Name: Karl [name] MRN: [id number] DOB: May 24, 1956 Today's Date: 06/17/2018    History of present illness This 62 y.o. male admitted with acute onsef ot dizziness, some Rt LE numbness, and intermittent diplopia.  MRI showed Acute infarcts in both the Right PCA and Right SCA territories   OT comments  Pt transferring to the chair this session with L inattention and L lateral lean with progressive (A) from mod to max. Pt noted to have decr sensation and attention to L with LB sock don. Pt tolerated activity well and most appropriate for CIR at this time.   Follow Up Recommendations  CIR;Supervision/Assistance - 24 hour    Equipment Recommendations  None recommended by OT    Recommendations for Other Services Rehab consult    Precautions / Restrictions Precautions Precautions: Fall Precaution Comments: poor proprioceptive awareness of Lt  Restrictions Weight Bearing Restrictions: No       Mobility Bed Mobility Overal bed mobility: Needs Assistance Bed Mobility: Supine to Sit     Supine to sit: Min assist     General bed mobility comments: pt falling to the L with exiting on the L. HOB elevated to allow patient to push off with L UE for weight bearing and also help with reinforcing awareness to LOB L  Transfers Overall transfer level: Needs assistance Equipment used: Rolling walker (2 wheeled) Transfers: Sit to/from Stand Sit to Stand: Mod assist         General transfer comment: pt initially able to initiate toward R side transfer to chair. pt once up with progressive L lean. pt max (A) to descned to the chair and holding the RW as he descends. pt does reach with R UE toward chair arm rest but does not release with L hand.    Balance Overall balance assessment: Needs assistance Sitting-balance support: Bilateral upper extremity supported Sitting balance-Leahy Scale: Fair                                      ADL either performed or assessed with clinical judgement   ADL Overall ADL's : Needs assistance/impaired Eating/Feeding: Set up;Sitting Eating/Feeding Details (indicate cue type and reason): pt noted to have ice cream on far L corner of tray and unaware of its presence.                  Lower Body Dressing: Moderate assistance;Bed level Lower Body Dressing Details (indicate cue type and reason): pt donning socks with knee flexion. pt falling to the L during attempt. pt leaning on R rail and states "let me lay this way to see if i can get this sock on" pt with decr trunk control with LB dressing Toilet Transfer: Moderate assistance;Stand-pivot Toilet Transfer Details (indicate cue type and reason): pt with incr L lean and lack of awareness to L lean during simulated transfer to the chair           General ADL Comments: Pt requires L side guarded for all transfers. pt with L lean at 6\' 2"  and lacks awareness for self correction at this time     Vision       Perception     Praxis      Cognition Arousal/Alertness: Awake/alert Behavior During Therapy: Flat affect Overall Cognitive Status: Impaired/Different from baseline Area of Impairment: Attention;Following commands;Safety/judgement;Awareness;Memory  Current Attention Level: Selective Memory: Decreased recall of precautions Following Commands: Follows one step commands with increased time Safety/Judgement: Decreased awareness of safety;Decreased awareness of deficits Awareness: Emergent Problem Solving: Slow processing;Decreased initiation General Comments: Pt states "where did that sock go? I just had it" pt losing sock on L thigh applying socks and unable to problem solve where it could be . pt needed cues to scan to locate sock on L thigh. Also noted decr sensation to not feel the sock touching his bare skin        Exercises     Shoulder Instructions       General Comments  educated OOB for all meals at this time     Pertinent Vitals/ Pain       Pain Assessment: No/denies pain  Home Living                                          Prior Functioning/Environment              Frequency  Min 2X/week        Progress Toward Goals  OT Goals(current goals can now be found in the care plan section)  Progress towards OT goals: Progressing toward goals  Acute Rehab OT Goals Patient Stated Goal: to get stronger  OT Goal Formulation: With patient Time For Goal Achievement: 06/29/18 Potential to Achieve Goals: Good ADL Goals Pt Will Perform Grooming: with min assist;standing Pt Will Perform Upper Body Bathing: with set-up;with supervision;sitting Pt Will Perform Lower Body Bathing: with min assist;sit to/from stand Pt Will Perform Upper Body Dressing: with supervision;sitting Pt Will Perform Lower Body Dressing: with min assist;sit to/from stand Pt Will Transfer to Toilet: with min assist;ambulating;regular height toilet;bedside commode;grab bars Pt Will Perform Toileting - Clothing Manipulation and hygiene: with min assist;sit to/from stand Additional ADL Goal #1: Pt will locate items on Lt during ADL with no more than 2 cues Additional ADL Goal #2: Pt will use Lt UE as an active assist consistently.  Plan Discharge plan remains appropriate    Co-evaluation                 AM-PAC PT "6 Clicks" Daily Activity     Outcome Measure   Help from another person eating meals?: A Little Help from another person taking care of personal grooming?: A Little Help from another person toileting, which includes using toliet, bedpan, or urinal?: A Lot Help from another person bathing (including washing, rinsing, drying)?: A Lot Help from another person to put on and taking off regular upper body clothing?: A Lot Help from another person to put on and taking off regular lower body clothing?: A Lot 6 Click Score: 14    End of Session  Equipment Utilized During Treatment: Rolling walker  OT Visit Diagnosis: Unsteadiness on feet (R26.81);Muscle weakness (generalized) (M62.81);Ataxia, unspecified (R27.0);Cognitive communication deficit (R41.841);Dizziness and giddiness (R42);Hemiplegia and hemiparesis Symptoms and signs involving cognitive functions: Cerebral infarction Hemiplegia - Right/Left: Left Hemiplegia - dominant/non-dominant: Non-Dominant Hemiplegia - caused by: Cerebral infarction   Activity Tolerance Patient tolerated treatment well   Patient Left in chair;with chair alarm set;with call bell/phone within reach   Nurse Communication Mobility status;Precautions        Time: 2761-8485 OT Time Calculation (min): 20 min  Charges: OT General Charges $OT Visit: 1 Visit OT Evaluation $OT Eval Moderate Complexity: 1 Mod  Mateo Flow   OTR/L Pager: [phone] Office: [phone] .    Boone Master B 06/17/2018, 9:58 AM

## 2018-06-17 NOTE — Clinical Social Work Placement (Signed)
   CLINICAL SOCIAL WORK PLACEMENT  NOTE  Date:  06/17/2018  Patient Details  Name: Karl [name] MRN: [id number] Date of Birth: May 14, 1956  Clinical Social Work is seeking post-discharge placement for this patient at the Skilled  Nursing Facility level of care (*CSW will initial, date and re-position this form in  chart as items are completed):  Yes   Patient/family provided with [facility] Clinical Social Work Department's list of facilities offering this level of care within the geographic area requested by the patient (or if unable, by the patient's family).  Yes   Patient/family informed of their freedom to choose among providers that offer the needed level of care, that participate in Medicare, Medicaid or managed care program needed by the patient, have an available bed and are willing to accept the patient.  Yes   Patient/family informed of [facility]'s ownership interest in [facility] and [facility], as well as of the fact that they are under no obligation to receive care at these facilities.  PASRR submitted to EDS on 06/17/18     PASRR number received on 06/17/18     Existing PASRR number confirmed on       FL2 transmitted to all facilities in geographic area requested by pt/family on 06/17/18     FL2 transmitted to all facilities within larger geographic area on       Patient informed that his/her managed care company has contracts with or will negotiate with certain facilities, including the following:            Patient/family informed of bed offers received.  Patient chooses bed at       Physician recommends and patient chooses bed at      Patient to be transferred to   on  .  Patient to be transferred to facility by       Patient family notified on   of transfer.  Name of family member notified:        PHYSICIAN Please sign FL2     Additional Comment:    _______________________________________________ Margarito Liner, LCSW 06/17/2018, 2:36  PM

## 2018-06-17 NOTE — Progress Notes (Signed)
Modified Barium Swallow Progress Note  Patient Details  Name: Karl [name] MRN: [id number] Date of Birth: 05/13/1956  Today's Date: 06/17/2018  Modified Barium Swallow completed.  Full report located under Chart Review in the Imaging Section.  Brief recommendations include the following:  Clinical Impression  Pt presents with functional oropharyngeal abilities as evidenced by timely swallow initiation and trace residue in vallecue and pyriform sinuses. No aspiration observed. Pt with missing teeth and as a result, mildly increased oral phase, therefore recommend continuing dysphagia 3 for ease of chewing, thin liquids and medicine whole with puree. At baseline, pt threw head back to promote transition of pill to pharynx, therefore recommend taking pills with puree.    Swallow Evaluation Recommendations       SLP Diet Recommendations: Dysphagia 3 (Mech soft) solids;Thin liquid   Liquid Administration via: Cup;Straw   Medication Administration: Whole meds with puree   Supervision: Patient able to self feed   Compensations: Small sips/bites;Slow rate   Postural Changes: Remain semi-upright after after feeds/meals (Comment)   Oral Care Recommendations: Oral care BID        Tiffnay Bossi 06/17/2018,12:02 PM

## 2018-06-17 NOTE — Progress Notes (Signed)
[facility] PROGRESS NOTE  Karl [name] ZOX:[id number] DOB: 11-22-1956 DOA: 06/14/2018  PCP: Pincus Sanes, MD  Brief History/Interval Summary: 62 year old African-American male with a past medical history of the artery disease, diabetes mellitus, hypertension, history of stroke earlier this year for which he was prescribed warfarin, BPH who recently underwent a TURP procedure on July 3.  His warfarin was discontinued for this surgery.  He has a Foley catheter in place.  Presented with complains of dizziness and being unsteady on his feet.  Initial evaluation raise concern for acute stroke.  Patient was hospitalized for further management.  Evaluation revealed clot in the LV apex.  Patient started on IV heparin and continued on warfarin.  Reason for Visit: Acute stroke  Consultants: Neurology  Procedures:  Transthoracic echocardiogram Study Conclusions  - Left ventricle: The cavity size was moderately dilated. Wall   thickness was normal. The estimated ejection fraction was 20%.   Diffuse hypokinesis. There is akinesis of the anteroseptal and   anterior myocardium. Indeterminate diastolic function. Definity   contrast shows swirling contrast and loosely organized thrombotic   material at the LV apex. - Aortic valve: Mildly calcified annulus. Trileaflet. - Mitral valve: There was mild regurgitation. - Right atrium: Central venous pressure (est): 3 mm Hg. - Atrial septum: No defect or patent foramen ovale was identified. - Tricuspid valve: There was trivial regurgitation. - Pulmonary arteries: Systolic pressure could not be accurately   estimated. - Pericardium, extracardiac: There was no pericardial effusion.  Antibiotics: Keflex  Subjective/Interval History: Patient states that he is feeling much better this morning.  Has not had any nausea vomiting since day before yesterday.  Tolerating his full liquid diet without any difficulties.    ROS: Denies any shortness of  breath  Objective:  Vital Signs  Vitals:   06/17/18 0100 06/17/18 0500 06/17/18 0824 06/17/18 0847  BP: 122/83 127/76 (!) 130/91   Pulse: 87 76 83   Resp: 20 18 18    Temp: 98.1 F (36.7 C) 98.1 F (36.7 C)  97.9 F (36.6 C)  TempSrc: Oral Oral  Oral  SpO2: 99% 100% 100%   Weight:      Height:        Intake/Output Summary (Last 24 hours) at 06/17/2018 1153 Last data filed at 06/17/2018 0848 Gross per 24 hour  Intake 294 ml  Output 1200 ml  Net -906 ml   Filed Weights   06/14/18 0958  Weight: 105.2 kg (232 lb)    General appearance: Awake alert.  In no distress Resp: Clear to auscultation bilaterally.  No wheezing rales or rhonchi Cardio: S1-S2 is normal regular.  No S3-S4.  No rubs murmurs or bruit GI: Soft.  Nontender nondistended.  Normal bowel sounds. Foley catheter is noted Extremities: No edema Neurologic: Alert and oriented x3.  Improving strength in the left upper extremity is noted.   Lab Results:  Data Reviewed: I have personally reviewed following labs and imaging studies  CBC: Recent Labs  Lab 06/11/18 1606 06/14/18 1018 06/16/18 0727 06/17/18 0718  WBC 11.0* 10.9* 10.1 11.6*  NEUTROABS  --  7.8*  --   --   HGB 14.9 15.5 14.8 14.2  HCT 44.2 46.0 47.1 45.1  MCV 75.8* 75.8* 76.7* 77.0*  PLT 398 387 375 392    Basic Metabolic Panel: Recent Labs  Lab 06/11/18 1606 06/14/18 1018 06/16/18 0727 06/17/18 0718  NA 139 138 135 134*  K 4.3 4.1 3.3* 3.3*  CL 101 96* 95*  96*  CO2 21* 23 28 26   GLUCOSE 166* 172* 148* 160*  BUN 21 30* 20 19  CREATININE 1.10 1.21 1.03 1.08  CALCIUM 10.0 9.7 9.2 9.1  MG  --   --   --  2.2    GFR: Estimated Creatinine Clearance: 90.5 mL/min (by C-G formula based on SCr of 1.08 mg/dL).  Liver Function Tests: Recent Labs  Lab 06/11/18 1606 06/14/18 1018  AST 15 13*  ALT 21 17  ALKPHOS 68 65  BILITOT 0.9 1.2  PROT 9.2* 8.8*  ALBUMIN 3.7 3.7    Recent Labs  Lab 06/11/18 1606  LIPASE 20     Coagulation Profile: Recent Labs  Lab 06/14/18 1018 06/15/18 0356 06/16/18 0727 06/17/18 0718  INR 1.16 1.50 1.85 2.58    HbA1C: Recent Labs    06/15/18 0356  HGBA1C 6.8*    CBG: Recent Labs  Lab 06/16/18 1140 06/16/18 1642 06/16/18 2117 06/17/18 0527 06/17/18 0735  GLUCAP 187* 168* 189* 180* 172*    Lipid Profile: Recent Labs    06/15/18 0356  CHOL 238*  HDL 29*  LDLCALC 182*  TRIG 137  CHOLHDL 8.2     Recent Results (from the past 240 hour(s))  Urine culture     Status: Abnormal   Collection Time: 06/11/18  3:59 PM  Result Value Ref Range Status   Specimen Description   Final    URINE, CATHETERIZED Performed at [facility], [address] [place], [place] [address]    Special Requests   Final    NONE Performed at [facility], [name], [place], [place] [address]    Culture (A)  Final    >=100,000 COLONIES/mL KLEBSIELLA PNEUMONIAE >=100,000 COLONIES/mL ENTEROCOCCUS FAECALIS    Report Status 06/14/2018 FINAL  Final   Organism ID, Bacteria KLEBSIELLA PNEUMONIAE (A)  Final   Organism ID, Bacteria ENTEROCOCCUS FAECALIS (A)  Final      Susceptibility   Enterococcus faecalis - MIC*    AMPICILLIN <=2 SENSITIVE Sensitive     LEVOFLOXACIN 1 SENSITIVE Sensitive     NITROFURANTOIN <=16 SENSITIVE Sensitive     VANCOMYCIN 1 SENSITIVE Sensitive     * >=100,000 COLONIES/mL ENTEROCOCCUS FAECALIS   Klebsiella pneumoniae - MIC*    AMPICILLIN RESISTANT Resistant     CEFAZOLIN <=4 SENSITIVE Sensitive     CEFTRIAXONE <=1 SENSITIVE Sensitive     CIPROFLOXACIN <=0.25 SENSITIVE Sensitive     GENTAMICIN <=1 SENSITIVE Sensitive     IMIPENEM <=0.25 SENSITIVE Sensitive     NITROFURANTOIN <=16 SENSITIVE Sensitive     TRIMETH/SULFA <=20 SENSITIVE Sensitive     AMPICILLIN/SULBACTAM 4 SENSITIVE Sensitive     PIP/TAZO <=4 SENSITIVE Sensitive     Extended ESBL NEGATIVE Sensitive     * >=100,000 COLONIES/mL KLEBSIELLA  PNEUMONIAE      Radiology Studies: Dg Abd Portable 2v  Result Date: 06/15/2018 CLINICAL DATA:  Nausea and vomiting. EXAM: PORTABLE ABDOMEN - 2 VIEW COMPARISON:  07/03/2013 FINDINGS: The bowel gas pattern is normal. No obstruction or significant ileus identified. There is no evidence of free air. No radio-opaque calculi or other significant radiographic abnormality is seen. The lumbar spine demonstrates degenerative disc disease. IMPRESSION: Negative. Electronically Signed   By: Irish Lack M.D.   On: 06/15/2018 15:09     Medications:  Scheduled: . carvedilol  12.5 mg Oral BID WC  . cephALEXin  500 mg Oral TID  . feeding supplement (ENSURE ENLIVE)  237 mL Oral  BID BM  . finasteride  5 mg Oral Daily  . insulin aspart  0-9 Units Subcutaneous TID WC  . polyethylene glycol  17 g Oral Daily  . potassium chloride  40 mEq Oral Once  . rosuvastatin  40 mg Oral q1800  . tamsulosin  0.4 mg Oral QPC supper  . warfarin  7.5 mg Oral ONCE-1800  . Warfarin - Pharmacist Dosing Inpatient   Does not apply q1800   Continuous:  ZOX:[id number] **OR** acetaminophen (TYLENOL) oral liquid 160 mg/5 mL **OR** acetaminophen, HYDROcodone-acetaminophen, ondansetron, promethazine, senna-docusate, sodium phosphate  Assessment/Plan:    Acute embolic stroke Patient seen by neurology.  Echocardiogram suggested clot in the apex.  Patient started on IV heparin.  He remains on warfarin.  INR noted to be therapeutic today.  Okay to stop IV heparin.  Patient did undergo CT angiogram head and neck did not show any stenosis of the carotid arteries.  LDL 182.  HbA1c 6.8. Patient was previously on warfarin for history of embolic stroke.  However warfarin was held for his prostate surgery earlier this month.  However it was resumed but INR noted to be subtherapeutic.  Seen by physical and Occupational Therapy.  CIR recommended.  They have been consulted. Patient reportedly has intolerance to atorvastatin.  Unclear if  other agents have been tried.  Started on Crestor by neurology.  Neurological work-up has been completed.  Clot in the LV apex On anticoagulation.  Nausea and vomiting Appears to have resolved.  Etiology unclear.  Could be secondary to stroke.  Could also be due to constipation.  Patient was given symptomatic treatment.  He appears to have improved.  His UA is noted to be abnormal.  He did have positive urine cultures as discussed below.  He has on a course of Keflex which is being continued.  Advance diet.  Abnormal UA with positive urine cultures Urine cultures from 7/9 was positive for Klebsiella as well as enterococcus.  Patient was prescribed a course of Keflex recently.  Sensitivities reviewed.  Klebsiella was sensitive to Keflex.  However enterococcus sensitive to Levaquin vancomycin and ampicillin.  WBC is only minimally elevated.  He is afebrile.  Patient has a indwelling Foley catheter.  His nausea vomiting could have been due to UTI but there are other reasons present as well.  Hold off on changing antibiotics for now.  Continue Keflex for now.  History of BPH and urinary retention status post recent TURP on 06/05/2018 Continue Flomax and Proscar.  Continue Foley catheter.  Followed by Dr. Alvester Morin with urology.  Foley will need to be kept until he has followed up with urology.  Essential hypertension Allowing permissive hypertension.  Blood pressure is reasonably well controlled.  Replace potassium.  Magnesium is normal.  History of coronary artery disease Currently chest pain-free.  Continue to monitor.  Chronic systolic and diastolic CHF Patient with EF of 20%.  Currently euvolemic.  Strict ins and outs and daily weights.  Patient went to one episode of NSVT on 7/14.  He was asymptomatic.  Patient's carvedilol was resumed.  Should be able to resume his ACE inhibitor soon.  Patient has previously refused ICD placement.  Followed by Dr. Ladona Ridgel.  On chronic anticoagulation This is for  previous history of embolic stroke.  Patient without any other indication for anti-coagulation.  Warfarin had to be held for his surgery on 7/3.  It was resumed however INR noted to be subtherapeutic on admission.  Patient started on IV heparin due to  presence of clot at the LV apex.  On warfarin as well.  INR is therapeutic today.  Stop heparin.  Diabetes mellitus type 2 Holding his oral agents.  SSI.  Monitor CBGs.  Constipation Stool softeners and laxatives as needed.  DVT Prophylaxis: On warfarin Code Status: Full code Family Communication: Discussed with the patient Disposition Plan: Patient seems to be stable to improved.  Hopefully to inpatient rehabilitation soon.    LOS: 3 days   Osvaldo Shipper  [facility] Pager [phone] 06/17/2018, 11:53 AM  If 7PM-7AM, please contact night-coverage at [email or web address], password [facility]

## 2018-06-17 NOTE — Progress Notes (Signed)
Inpatient Rehabilitation  Attempted to check benefits and open up case for IP Rehab; however, per [employer] there is no acute case built or approved and therefore a request for rehab cannot be initiated at this time.  Plan to follow up with the acute team and provide updates as I know.  Call if questions.   Charlane Ferretti., CCC/SLP Admission Coordinator  [facility]  Cell [phone]

## 2018-06-17 NOTE — Progress Notes (Signed)
ANTICOAGULATION CONSULT NOTE - Follow Up Consult  Pharmacy Consult for Heparin and Coumadin Indication: stroke  Allergies  Allergen Reactions  . Lipitor [Atorvastatin] Nausea And Vomiting    Patient Measurements: Height: 6\' 2"  (188 cm) Weight: 232 lb (105.2 kg) IBW/kg (Calculated) : 82.2  Vital Signs: Temp: 97.9 F (36.6 C) (07/15 0847) Temp Source: Oral (07/15 0847) BP: 130/91 (07/15 0824) Pulse Rate: 83 (07/15 0824)  Labs: Recent Labs    06/14/18 1018 06/15/18 0356 06/16/18 0727 06/16/18 1405 06/17/18 0718  HGB 15.5  --  14.8  --  14.2  HCT 46.0  --  47.1  --  45.1  PLT 387  --  375  --  392  APTT 26  --   --   --   --   LABPROT 14.7 18.0* 21.1*  --  27.5*  INR 1.16 1.50 1.85  --  2.58  HEPARINUNFRC  --   --  0.49 0.68 0.50  CREATININE 1.21  --  1.03  --  1.08    Estimated Creatinine Clearance: 90.5 mL/min (by C-G formula based on SCr of 1.08 mg/dL).   Medications:  Medications Prior to Admission  Medication Sig Dispense Refill Last Dose  . carvedilol (COREG) 12.5 MG tablet TAKE 1 & 1/2 (ONE & ONE-HALF) TABLETS BY MOUTH TWICE DAILY 270 tablet 2 06/13/2018 at 11:00am  . [EXPIRED] cephALEXin (KEFLEX) 500 MG capsule Take 1 capsule (500 mg total) by mouth 3 (three) times daily for 5 days. 15 capsule 0 06/13/2018 at Unknown time  . dapagliflozin propanediol (FARXIGA) 5 MG TABS tablet Take 5 mg by mouth daily. 30 tablet 5 06/13/2018 at Unknown time  . enalapril (VASOTEC) 5 MG tablet TAKE 1 TABLET BY MOUTH TWICE DAILY (Patient taking differently: TAKE 1 TABLET (5mg ) BY MOUTH TWICE DAILY) 60 tablet 11 06/13/2018 at Unknown time  . finasteride (PROSCAR) 5 MG tablet Take 5 mg by mouth daily.   06/13/2018 at Unknown time  . HYDROcodone-acetaminophen (NORCO/VICODIN) 5-325 MG tablet Take 1 tablet by mouth every 4 (four) hours as needed for moderate pain. 12 tablet 0 Past Week at Unknown time  . metFORMIN (GLUCOPHAGE) 1000 MG tablet TAKE 1 TABLET BY MOUTH TWICE DAILY WITH A MEAL  (Patient taking differently: TAKE 1 TABLET (1000mg ) BY MOUTH TWICE DAILY WITH A MEAL) 90 tablet 1 06/13/2018 at Unknown time  . ondansetron (ZOFRAN ODT) 4 MG disintegrating tablet 4mg  ODT q4 hours prn nausea/vomit (Patient taking differently: Take 4 mg by mouth every 4 (four) hours as needed for nausea or vomiting. 4mg  ODT q4 hours prn nausea/vomit) 10 tablet 0 06/13/2018 at Unknown time  . tamsulosin (FLOMAX) 0.4 MG CAPS capsule Take 1 capsule (0.4 mg total) by mouth daily after supper. 90 capsule 1 06/13/2018 at Unknown time  . warfarin (COUMADIN) 10 MG tablet Take 10 mg by mouth daily.   06/14/2018 at 1:00am    Assessment: 62 yo M on Coumadin PTA for hx CVA.  Coumadin was held for a scheduled procedure on 7/3.  Coumadin was restarted outpt post-procedure but INR subtherapeutic on this admission.  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 PMH includes CAD, DM, HTN, CVA. Pharmacy consulted to dose warfarin and heparin bridge.   Coumadin 10mg  PO daily PTA. Questionable adherence per past notes.  INR therapeutic today 2.58; heparin level therapeutic at 0.5 (lower goal 0.3-0.5) No s/sx of bleeding, CBC stable.  Goal of Therapy:  INR 2-3  Monitor platelets by anticoagulation protocol: Yes   Plan:  -Would DC heparin if clinically indicated, anticipate INR will stay therapeutic. -Will decrease warfarin to 7.5mg  PO x 1 tonight d/t concern for likely supratherapeutic INR tomorrow. -Monitor daily INR, CBC, and signs of bleeding   Thank you for involving pharmacy in this patient's care.  Wendelyn Breslow, PharmD PGY1 Pharmacy Resident Phone: [phone] 06/17/2018 10:12 AM

## 2018-06-18 ENCOUNTER — Encounter (HOSPITAL_COMMUNITY): Payer: Self-pay | Admitting: *Deleted

## 2018-06-18 ENCOUNTER — Other Ambulatory Visit: Payer: Self-pay

## 2018-06-18 ENCOUNTER — Inpatient Hospital Stay (HOSPITAL_COMMUNITY)
Admission: RE | Admit: 2018-06-18 | Discharge: 2018-07-09 | DRG: 057 | Disposition: A | Discharge status: Home / Self Care | Payer: Managed Care, Other (non HMO) | Source: Intra-hospital | Attending: Physical Medicine & Rehabilitation | Admitting: Physical Medicine & Rehabilitation

## 2018-06-18 DIAGNOSIS — N390 Urinary tract infection, site not specified: Secondary | ICD-10-CM

## 2018-06-18 DIAGNOSIS — N400 Enlarged prostate without lower urinary tract symptoms: Secondary | ICD-10-CM | POA: Diagnosis present

## 2018-06-18 DIAGNOSIS — E871 Hypo-osmolality and hyponatremia: Secondary | ICD-10-CM

## 2018-06-18 DIAGNOSIS — Z832 Family history of diseases of the blood and blood-forming organs and certain disorders involving the immune mechanism: Secondary | ICD-10-CM

## 2018-06-18 DIAGNOSIS — I2510 Atherosclerotic heart disease of native coronary artery without angina pectoris: Secondary | ICD-10-CM | POA: Diagnosis present

## 2018-06-18 DIAGNOSIS — E119 Type 2 diabetes mellitus without complications: Secondary | ICD-10-CM | POA: Diagnosis present

## 2018-06-18 DIAGNOSIS — I10 Essential (primary) hypertension: Secondary | ICD-10-CM | POA: Diagnosis present

## 2018-06-18 DIAGNOSIS — Z87891 Personal history of nicotine dependence: Secondary | ICD-10-CM | POA: Diagnosis not present

## 2018-06-18 DIAGNOSIS — Z833 Family history of diabetes mellitus: Secondary | ICD-10-CM | POA: Diagnosis not present

## 2018-06-18 DIAGNOSIS — Z7984 Long term (current) use of oral hypoglycemic drugs: Secondary | ICD-10-CM

## 2018-06-18 DIAGNOSIS — Z955 Presence of coronary angioplasty implant and graft: Secondary | ICD-10-CM

## 2018-06-18 DIAGNOSIS — I69312 Visuospatial deficit and spatial neglect following cerebral infarction: Secondary | ICD-10-CM

## 2018-06-18 DIAGNOSIS — I255 Ischemic cardiomyopathy: Secondary | ICD-10-CM

## 2018-06-18 DIAGNOSIS — Z7901 Long term (current) use of anticoagulants: Secondary | ICD-10-CM | POA: Diagnosis not present

## 2018-06-18 DIAGNOSIS — Z823 Family history of stroke: Secondary | ICD-10-CM

## 2018-06-18 DIAGNOSIS — R7309 Other abnormal glucose: Secondary | ICD-10-CM | POA: Diagnosis not present

## 2018-06-18 DIAGNOSIS — Z8673 Personal history of transient ischemic attack (TIA), and cerebral infarction without residual deficits: Secondary | ICD-10-CM | POA: Diagnosis present

## 2018-06-18 DIAGNOSIS — R791 Abnormal coagulation profile: Secondary | ICD-10-CM

## 2018-06-18 DIAGNOSIS — I6931 Attention and concentration deficit following cerebral infarction: Secondary | ICD-10-CM

## 2018-06-18 DIAGNOSIS — E1165 Type 2 diabetes mellitus with hyperglycemia: Secondary | ICD-10-CM

## 2018-06-18 DIAGNOSIS — G3184 Mild cognitive impairment, so stated: Secondary | ICD-10-CM | POA: Diagnosis present

## 2018-06-18 DIAGNOSIS — I63531 Cerebral infarction due to unspecified occlusion or stenosis of right posterior cerebral artery: Secondary | ICD-10-CM

## 2018-06-18 DIAGNOSIS — R0989 Other specified symptoms and signs involving the circulatory and respiratory systems: Secondary | ICD-10-CM

## 2018-06-18 DIAGNOSIS — E876 Hypokalemia: Secondary | ICD-10-CM

## 2018-06-18 DIAGNOSIS — I69354 Hemiplegia and hemiparesis following cerebral infarction affecting left non-dominant side: Secondary | ICD-10-CM | POA: Diagnosis present

## 2018-06-18 DIAGNOSIS — E1151 Type 2 diabetes mellitus with diabetic peripheral angiopathy without gangrene: Secondary | ICD-10-CM

## 2018-06-18 DIAGNOSIS — E1159 Type 2 diabetes mellitus with other circulatory complications: Secondary | ICD-10-CM | POA: Diagnosis present

## 2018-06-18 LAB — GLUCOSE, CAPILLARY
GLUCOSE-CAPILLARY: 123 mg/dL — AB (ref 70–99)
GLUCOSE-CAPILLARY: 222 mg/dL — AB (ref 70–99)
GLUCOSE-CAPILLARY: 160 mg/dL — AB (ref 70–99)
GLUCOSE-CAPILLARY: 192 mg/dL — AB (ref 70–99)
Glucose-Capillary: 217 mg/dL — ABNORMAL HIGH (ref 70–99)
Glucose-Capillary: 141 mg/dL — ABNORMAL HIGH (ref 70–99)

## 2018-06-18 LAB — CBC
HCT: 41.9 % (ref 39.0–52.0)
Hemoglobin: 13.4 g/dL (ref 13.0–17.0)
MCH: 24.6 pg — AB (ref 26.0–34.0)
MCHC: 32 g/dL (ref 30.0–36.0)
MCV: 77 fL — ABNORMAL LOW (ref 78.0–100.0)
PLATELETS: 373 10*3/uL (ref 150–400)
RBC: 5.44 MIL/uL (ref 4.22–5.81)
RDW: 14.1 % (ref 11.5–15.5)
WBC: 10 10*3/uL (ref 4.0–10.5)

## 2018-06-18 LAB — BASIC METABOLIC PANEL
Anion gap: 9 (ref 5–15)
BUN: 16 mg/dL (ref 8–23)
CHLORIDE: 97 mmol/L — AB (ref 98–111)
CO2: 29 mmol/L (ref 22–32)
Calcium: 9.1 mg/dL (ref 8.9–10.3)
Creatinine, Ser: 1.13 mg/dL (ref 0.61–1.24)
GFR calc Af Amer: >60 mL/min (ref >60)
GFR calc non Af Amer: >60 mL/min (ref >60)
Glucose, Bld: 156 mg/dL — ABNORMAL HIGH (ref 70–99)
Potassium: 3.5 mmol/L (ref 3.5–5.1)
SODIUM: 135 mmol/L (ref 135–145)

## 2018-06-18 LAB — PROTIME-INR
INR: 2.22
Prothrombin Time: 24.4 seconds — ABNORMAL HIGH (ref 11.4–15.2)

## 2018-06-18 MED ORDER — TAMSULOSIN HCL 0.4 MG PO CAPS
0.4000 mg | ORAL_CAPSULE | Freq: Every day | ORAL | Status: DC
  Administered 2018-06-18 – 2018-07-08 (×21): 0.4 mg via ORAL
  Filled 2018-06-18 (×21): qty 1

## 2018-06-18 MED ORDER — METFORMIN HCL 500 MG PO TABS
500.0000 mg | ORAL_TABLET | Freq: Two times a day (BID) | ORAL | Status: DC
  Administered 2018-06-18 – 2018-06-21 (×6): 500 mg via ORAL
  Filled 2018-06-18 (×6): qty 1

## 2018-06-18 MED ORDER — FLEET ENEMA 7-19 GM/118ML RE ENEM: 1.0000 | ENEMA | Freq: Once | RECTAL | Status: DC | PRN

## 2018-06-18 MED ORDER — PROCHLORPERAZINE EDISYLATE 10 MG/2ML IJ SOLN: 5.0000 mg | Freq: Four times a day (QID) | INTRAMUSCULAR | Status: DC | PRN

## 2018-06-18 MED ORDER — POLYETHYLENE GLYCOL 3350 17 G PO PACK
17.0000 g | PACK | Freq: Two times a day (BID) | ORAL | Status: DC
Start: 2018-06-18 — End: 2018-07-09
  Administered 2018-06-18 – 2018-07-08 (×31): 17 g via ORAL
  Filled 2018-06-18 (×39): qty 1

## 2018-06-18 MED ORDER — WARFARIN SODIUM 5 MG PO TABS
10.0000 mg | ORAL_TABLET | Freq: Every day | ORAL | Status: DC
  Administered 2018-06-19 – 2018-06-23 (×5): 10 mg via ORAL
  Filled 2018-06-18 (×5): qty 2

## 2018-06-18 MED ORDER — METFORMIN HCL 500 MG PO TABS: 500.0000 mg | ORAL_TABLET | Freq: Two times a day (BID) | ORAL | Status: DC

## 2018-06-18 MED ORDER — ROSUVASTATIN CALCIUM 40 MG PO TABS
40.0000 mg | ORAL_TABLET | Freq: Every day | ORAL | Status: DC
  Administered 2018-06-19 – 2018-07-08 (×20): 40 mg via ORAL
  Filled 2018-06-18: qty 1
  Filled 2018-06-18: qty 2
  Filled 2018-06-18 (×6): qty 1
  Filled 2018-06-18: qty 2
  Filled 2018-06-18 (×10): qty 1
  Filled 2018-06-18: qty 2
  Filled 2018-06-18: qty 1

## 2018-06-18 MED ORDER — POTASSIUM CHLORIDE CRYS ER 10 MEQ PO TBCR
10.0000 meq | EXTENDED_RELEASE_TABLET | Freq: Two times a day (BID) | ORAL | Status: DC
  Administered 2018-06-18 – 2018-06-20 (×4): 10 meq via ORAL
  Filled 2018-06-18 (×6): qty 1

## 2018-06-18 MED ORDER — LIDOCAINE HCL URETHRAL/MUCOSAL 2 % EX GEL
CUTANEOUS | Status: DC | PRN
  Filled 2018-06-18: qty 5

## 2018-06-18 MED ORDER — BISACODYL 10 MG RE SUPP
10.0000 mg | Freq: Every day | RECTAL | Status: DC | PRN
  Administered 2018-06-19: 10 mg via RECTAL
  Filled 2018-06-18: qty 1

## 2018-06-18 MED ORDER — CARVEDILOL 12.5 MG PO TABS
12.5000 mg | ORAL_TABLET | Freq: Two times a day (BID) | ORAL | Status: DC
  Administered 2018-06-19 – 2018-07-09 (×41): 12.5 mg via ORAL
  Filled 2018-06-18 (×41): qty 1

## 2018-06-18 MED ORDER — ACETAMINOPHEN 325 MG PO TABS
325.0000 mg | ORAL_TABLET | ORAL | Status: DC | PRN
  Administered 2018-06-26: 650 mg via ORAL
  Filled 2018-06-18: qty 2

## 2018-06-18 MED ORDER — FINASTERIDE 5 MG PO TABS
5.0000 mg | ORAL_TABLET | Freq: Every day | ORAL | Status: DC
  Administered 2018-06-19 – 2018-07-09 (×21): 5 mg via ORAL
  Filled 2018-06-18 (×21): qty 1

## 2018-06-18 MED ORDER — INSULIN ASPART 100 UNIT/ML ~~LOC~~ SOLN
0.0000 [IU] | Freq: Three times a day (TID) | SUBCUTANEOUS | Status: DC
  Administered 2018-06-19: 2 [IU] via SUBCUTANEOUS
  Administered 2018-06-19: 1 [IU] via SUBCUTANEOUS
  Administered 2018-06-19: 3 [IU] via SUBCUTANEOUS
  Administered 2018-06-20: 1 [IU] via SUBCUTANEOUS
  Administered 2018-06-20 – 2018-06-22 (×6): 2 [IU] via SUBCUTANEOUS
  Administered 2018-06-22: 1 [IU] via SUBCUTANEOUS
  Administered 2018-06-22: 2 [IU] via SUBCUTANEOUS
  Administered 2018-06-23 (×2): 1 [IU] via SUBCUTANEOUS
  Administered 2018-06-24: 2 [IU] via SUBCUTANEOUS
  Administered 2018-06-24: 1 [IU] via SUBCUTANEOUS
  Administered 2018-06-25: 2 [IU] via SUBCUTANEOUS
  Administered 2018-06-26 (×2): 1 [IU] via SUBCUTANEOUS
  Administered 2018-06-26: 2 [IU] via SUBCUTANEOUS
  Administered 2018-06-27 (×3): 1 [IU] via SUBCUTANEOUS
  Administered 2018-06-28 (×2): 2 [IU] via SUBCUTANEOUS
  Administered 2018-06-28: 1 [IU] via SUBCUTANEOUS
  Administered 2018-06-29 (×2): 2 [IU] via SUBCUTANEOUS
  Administered 2018-06-29 – 2018-06-30 (×2): 1 [IU] via SUBCUTANEOUS
  Administered 2018-06-30: 2 [IU] via SUBCUTANEOUS
  Administered 2018-07-01: 1 [IU] via SUBCUTANEOUS
  Administered 2018-07-01: 2 [IU] via SUBCUTANEOUS
  Administered 2018-07-01 – 2018-07-03 (×4): 1 [IU] via SUBCUTANEOUS
  Administered 2018-07-03: 2 [IU] via SUBCUTANEOUS
  Administered 2018-07-04: 3 [IU] via SUBCUTANEOUS
  Administered 2018-07-04 (×2): 1 [IU] via SUBCUTANEOUS
  Administered 2018-07-05: 3 [IU] via SUBCUTANEOUS
  Administered 2018-07-05 – 2018-07-06 (×4): 1 [IU] via SUBCUTANEOUS
  Administered 2018-07-07: 2 [IU] via SUBCUTANEOUS
  Administered 2018-07-07: 1 [IU] via SUBCUTANEOUS
  Administered 2018-07-08: 2 [IU] via SUBCUTANEOUS
  Administered 2018-07-08 – 2018-07-09 (×2): 1 [IU] via SUBCUTANEOUS

## 2018-06-18 MED ORDER — DIPHENHYDRAMINE HCL 12.5 MG/5ML PO ELIX: 12.5000 mg | ORAL_SOLUTION | Freq: Four times a day (QID) | ORAL | Status: DC | PRN

## 2018-06-18 MED ORDER — PROCHLORPERAZINE MALEATE 5 MG PO TABS
5.0000 mg | ORAL_TABLET | Freq: Four times a day (QID) | ORAL | Status: DC | PRN
  Administered 2018-06-24 (×2): 10 mg via ORAL
  Filled 2018-06-18 (×3): qty 2

## 2018-06-18 MED ORDER — CEPHALEXIN 250 MG PO CAPS: 500.0000 mg | ORAL_CAPSULE | Freq: Three times a day (TID) | ORAL | Status: DC

## 2018-06-18 MED ORDER — WARFARIN SODIUM 5 MG PO TABS
10.0000 mg | ORAL_TABLET | Freq: Every day | ORAL | Status: DC
  Administered 2018-06-18: 10 mg via ORAL
  Filled 2018-06-18: qty 2

## 2018-06-18 MED ORDER — WARFARIN - PHARMACIST DOSING INPATIENT
Freq: Every day | Status: DC
  Administered 2018-06-22 – 2018-06-23 (×2)
  Filled 2018-06-18: qty 1

## 2018-06-18 MED ORDER — ALUM & MAG HYDROXIDE-SIMETH 200-200-20 MG/5ML PO SUSP: 30.0000 mL | ORAL | Status: DC | PRN

## 2018-06-18 MED ORDER — GUAIFENESIN-DM 100-10 MG/5ML PO SYRP: 5.0000 mL | ORAL_SOLUTION | Freq: Four times a day (QID) | ORAL | Status: DC | PRN

## 2018-06-18 MED ORDER — INSULIN ASPART 100 UNIT/ML ~~LOC~~ SOLN
0.0000 [IU] | Freq: Every day | SUBCUTANEOUS | Status: DC
  Administered 2018-06-30: 2 [IU] via SUBCUTANEOUS

## 2018-06-18 MED ORDER — PROCHLORPERAZINE 25 MG RE SUPP: 12.5000 mg | Freq: Four times a day (QID) | RECTAL | Status: DC | PRN

## 2018-06-18 MED ORDER — TRAZODONE HCL 50 MG PO TABS: 25.0000 mg | ORAL_TABLET | Freq: Every evening | ORAL | Status: DC | PRN

## 2018-06-18 NOTE — PMR Pre-admission (Signed)
PMR Admission Coordinator Pre-Admission Assessment  Patient: Karl [name] is an 62 y.o., male MRN: [id number] DOB: Jul 31, 1956 Height: 6\' 2"  (188 cm) Weight: 105.2 kg (232 lb)              Insurance Information HMO:     PPO: X     PCP:      IPA:      80/20:      OTHER:  PRIMARY: [employer]      Policy#: [id number]      Subscriber: Self CM Name: Dartha Lodge      Phone#: [phone] [id number]     Fax#: [phone] Pre-Cert#: [id number]      Employer: Full Time Benefits:  Phone #: [phone]     Name: Verified via automated system  Eff. Date: 02/02/16-02/01/19     Deduct: $3500      Out of Pocket Max: $6000      Life Max: N/A CIR: 100%      SNF: 100% Outpatient: 100%     Co-Pay: $0 Home Health: 100%      Co-Pay: $0 DME: 100%     Co-Pay: $0 Providers: In-network   SECONDARY: None        Emergency Contact Information Contact Information    Name Relation Home Work Mobile   Feeny,Dianne Spouse   [phone]   Lowe,[name] Daughter [phone]  [phone]   [name] Daughter   [phone]   [name] Grandaughter   [phone]     Current Medical History  Patient Admitting Diagnosis: bilateral posterior circulation infarcts with subsequent balance and gait deficits  History of Present Illness: Karl [name] is a 62 year old male with history of T2DM, CAD with ICM on chronic coumadin, prior CVA 12/2017, TURP 06/05/18 for BPH who was admitted on 06/14/18 with dizziness, RLE numbness, double vision and balance deficits.  Patient had been off coumadin due to surgical procedure and INR 1.16 at admission. UDS negative. CTA head/neck showed hypoattenuation in right thalamus, right medial occipital and right superior cerebellum. MRI done revealing acute infarcts in R-PCA and R-SCA territories with punctate infract in L-PCA white matter felt to be embolic.   2 D echo showed EF 20% with diffuse hypokinesis and loosely organized thrombotic material at LV apex. He was started on IV heparin  bridge till INR therapeutic.  Dr. Roda Shutters felt to be embolic due to cardiac thrombus and heparin d/c once INR therapeutic.  MBS done revealing functional swallow and he was placed on dysphagia 3, thins. He continues to be limited by balance deficits, left sided weakness with left hemianopsia and left inattention. CIR recommended due to functional decline and patient admitted 06/18/18.   NIH Total: 3    Past Medical History  Past Medical History:  Diagnosis Date  . Acute CVA (cerebrovascular accident) (HCC) 06/14/2018  . Coronary artery disease    blockage   Stent Dr. Ladona Ridgel 15 years 1998  . Dyspnea   . Hypertension   . Stroke [facility]) 2019   denies residual on 06/14/2018  . Type II diabetes mellitus (HCC) 07/2014 dx    Family History  family history includes Clotting disorder (age of onset: 74) in his sister; Diabetes in his brother, father, maternal grandmother, mother, other, and sister; Stroke (age of onset: 50) in his father.  Prior Rehab/Hospitalizations:  Has the patient had major surgery during 100 days prior to admission? Yes  Current Medications   Current Facility-Administered Medications:  .  acetaminophen (TYLENOL) tablet 650 mg, 650 mg,  Oral, Q4H PRN, 650 mg at 06/15/18 1025 **OR** acetaminophen (TYLENOL) solution 650 mg, 650 mg, Per Tube, Q4H PRN **OR** acetaminophen (TYLENOL) suppository 650 mg, 650 mg, Rectal, Q4H PRN, Mikhail, Maryann, DO .  carvedilol (COREG) tablet 12.5 mg, 12.5 mg, Oral, BID WC, Osvaldo Shipper, MD, 12.5 mg at 06/18/18 0858 .  cephALEXin (KEFLEX) capsule 500 mg, 500 mg, Oral, TID, Osvaldo Shipper, MD, 500 mg at 06/18/18 0858 .  feeding supplement (ENSURE ENLIVE) (ENSURE ENLIVE) liquid 237 mL, 237 mL, Oral, BID BM, Mikhail, Maryann, DO, 237 mL at 06/17/18 1532 .  finasteride (PROSCAR) tablet 5 mg, 5 mg, Oral, Daily, Mikhail, [place], DO, 5 mg at 06/18/18 0858 .  HYDROcodone-acetaminophen (NORCO/VICODIN) 5-325 MG per tablet 1 tablet, 1 tablet, Oral, Q4H PRN,  Mikhail, Maryann, DO .  insulin aspart (novoLOG) injection 0-9 Units, 0-9 Units, Subcutaneous, TID WC, Mikhail, [place], DO, 3 Units at 06/18/18 1147 .  ondansetron (ZOFRAN-ODT) disintegrating tablet 4 mg, 4 mg, Oral, Q4H PRN, Edsel Petrin, DO, 4 mg at 06/15/18 1025 .  polyethylene glycol (MIRALAX / GLYCOLAX) packet 17 g, 17 g, Oral, Daily, Osvaldo Shipper, MD, 17 g at 06/18/18 0856 .  promethazine (PHENERGAN) injection 12.5 mg, 12.5 mg, Intravenous, Q6H PRN, Osvaldo Shipper, MD .  rosuvastatin (CRESTOR) tablet 40 mg, 40 mg, Oral, q1800, Marvel Plan, MD, 40 mg at 06/17/18 1743 .  senna-docusate (Senokot-S) tablet 1 tablet, 1 tablet, Oral, QHS PRN, Edsel Petrin, DO, 1 tablet at 06/15/18 2242 .  sodium phosphate (FLEET) 7-19 GM/118ML enema 1 enema, 1 enema, Rectal, Daily PRN, Osvaldo Shipper, MD, 1 enema at 06/17/18 1831 .  tamsulosin (FLOMAX) capsule 0.4 mg, 0.4 mg, Oral, QPC supper, Edsel Petrin, DO, 0.4 mg at 06/17/18 1743 .  warfarin (COUMADIN) tablet 10 mg, 10 mg, Oral, q1800, Bajbus, Lauren D, RPH .  Warfarin - Pharmacist Dosing Inpatient, , Does not apply, q1800, Cindi Carbon, RPH  Patients Current Diet:  Diet Order           DIET DYS 3 Room service appropriate? Yes; Fluid consistency: Thin  Diet effective now          Precautions / Restrictions Precautions Precautions: Fall Precaution Comments: poor proprioceptive awareness of Lt  Restrictions Weight Bearing Restrictions: No   Has the patient had 2 or more falls or a fall with injury in the past year?No  Prior Activity Level Community (5-7x/wk): Prior to admission patient was fully independent, active, and working.  He enjoys working on cars and in the yard and has a supportive family available to assist as needed.  Home Assistive Devices / Equipment Home Assistive Devices/Equipment: Eyeglasses, CBG Meter Home Equipment: None  Prior Device Use: Indicate devices/aids used by the patient prior to current illness,  exacerbation or injury? None of the above  Prior Functional Level Prior Function Level of Independence: Independent Comments: works packing climbing ropes   Self Care: Did the patient need help bathing, dressing, using the toilet or eating? Independent  Indoor Mobility: Did the patient need assistance with walking from room to room (with or without device)? Independent  Stairs: Did the patient need assistance with internal or external stairs (with or without device)? Independent  Functional Cognition: Did the patient need help planning regular tasks such as shopping or remembering to take medications? Independent  Current Functional Level Cognition  Arousal/Alertness: Awake/alert Overall Cognitive Status: Impaired/Different from baseline Current Attention Level: Selective Orientation Level: Oriented X4 Following Commands: Follows one step commands with increased time Safety/Judgement: Decreased awareness  of safety, Decreased awareness of deficits General Comments: Pt states "where did that sock go? I just had it" pt losing sock on L thigh applying socks and unable to problem solve where it could be . pt needed cues to scan to locate sock on L thigh. Also noted decr sensation to not feel the sock touching his bare skin Attention: Selective Selective Attention: Impaired Selective Attention Impairment: Verbal complex, Functional complex Memory: Impaired Memory Impairment: Decreased recall of new information, Retrieval deficit, Decreased short term memory Decreased Short Term Memory: Verbal complex, Functional complex Awareness: Impaired Awareness Impairment: Emergent impairment, Anticipatory impairment Problem Solving: Impaired Problem Solving Impairment: Verbal complex, Functional complex Executive Function: Reasoning Reasoning: Impaired Reasoning Impairment: Verbal basic, Functional complex Safety/Judgment: Appears intact    Extremity Assessment (includes  Sensation/Coordination)  Upper Extremity Assessment: LUE deficits/detail LUE Deficits / Details: Decr fine motor. pt reports "it is delayed" describing the use of L UE with R UE to complete adls. pt noted to have AROM but decr proprioception LUE Sensation: decreased proprioception LUE Coordination: decreased fine motor, decreased gross motor  Lower Extremity Assessment: Defer to PT evaluation LLE Sensation: decreased proprioception LLE Coordination: decreased gross motor    ADLs  Overall ADL's : Needs assistance/impaired Eating/Feeding: Set up, Sitting Eating/Feeding Details (indicate cue type and reason): pt noted to have ice cream on far L corner of tray and unaware of its presence.  Grooming: Wash/dry hands, Wash/dry face, Oral care, Brushing hair, Minimal assistance, Sitting Upper Body Bathing: Moderate assistance, Sitting Lower Body Bathing: Maximal assistance, Sit to/from stand Upper Body Dressing : Moderate assistance, Sitting Lower Body Dressing: Moderate assistance, Bed level Lower Body Dressing Details (indicate cue type and reason): pt donning socks with knee flexion. pt falling to the L during attempt. pt leaning on R rail and states "let me lay this way to see if i can get this sock on" pt with decr trunk control with LB dressing Toilet Transfer: Moderate assistance, Tax adviser Details (indicate cue type and reason): pt with incr L lean and lack of awareness to L lean during simulated transfer to the chair Toileting- Clothing Manipulation and Hygiene: Maximal assistance, Sit to/from stand Functional mobility during ADLs: Minimal assistance, Moderate assistance, Rolling walker General ADL Comments: Pt requires L side guarded for all transfers. pt with L lean at 6\' 2"  and lacks awareness for self correction at this time    Mobility  Overal bed mobility: Needs Assistance Bed Mobility: Supine to Sit Supine to sit: Min guard Sit to supine: Min assist General bed  mobility comments: min guard for safety; increased time and effort    Transfers  Overall transfer level: Needs assistance Equipment used: Rolling walker (2 wheeled) Transfers: Sit to/from Stand Sit to Stand: Mod assist Squat pivot transfers: Mod assist General transfer comment: cues for safe hand placement and assistance required for balance      Ambulation / Gait / Stairs / Wheelchair Mobility  Ambulation/Gait Ambulation/Gait assistance: Max assist, +2 safety/equipment Gait Distance (Feet): 8 Feet Assistive device: Rolling walker (2 wheeled) Gait Pattern/deviations: Step-through pattern, Decreased step length - right, Ataxic(atatxic L LE ) General Gait Details: cues required for sequencing, L LE placement, BOS, and finding midline posture; assistance required for balance, managing RW, and weight shifting     Posture / Balance Dynamic Sitting Balance Sitting balance - Comments: Lt lateral lean.  requires min A intermittently to correct  Balance Overall balance assessment: Needs assistance Sitting-balance support: Bilateral upper extremity supported Sitting balance-Leahy  Scale: Fair Sitting balance - Comments: Lt lateral lean.  requires min A intermittently to correct  Postural control: Left lateral lean Standing balance support: Bilateral upper extremity supported Standing balance-Leahy Scale: Poor Standing balance comment: requires mod A     Special needs/care consideration BiPAP/CPAP: No CPM: No Continuous Drip IV: No Dialysis: No         Life Vest: No Oxygen: No Special Bed: No Trach Size: No Wound Vac (area): No       Skin: WDL                               Bowel mgmt: Per RN, Eugenie Norrie report enema administered 7/15 which resulted in small BM  Bladder mgmt: Foley placed 06/05/18 due to bladder outlet obstruction  Diabetic mgmt: Yes, prior to admission patient checked CBG daily and took oral medication for management      Previous Home Environment Living Arrangements:  Children, Other (Comment)  Lives With: Daughter(and grandson) Available Help at Discharge: Family, Available 24 hours/day Type of Home: House Home Layout: One level Home Access: Stairs to enter Secretary/administrator of Steps: 1 Bathroom Shower/Tub: Engineer, manufacturing systems: Handicapped height Home Care Services: No  Discharge Living Setting Plans for Discharge Living Setting: Patient's home, House, Lives with (comment)(spouse and daughter) Type of Home at Discharge: House Discharge Home Layout: One level Discharge Home Access: Stairs to enter Entrance Stairs-Rails: None Entrance Stairs-Number of Steps: 1 Discharge Bathroom Shower/Tub: Tub/shower unit, Curtain Discharge Bathroom Toilet: Handicapped height Discharge Bathroom Accessibility: Yes How Accessible: Accessible via walker Does the patient have any problems obtaining your medications?: No  Social/Family/Support Systems Patient Roles: Spouse, Parent, Other (Comment)(Grandparent ) Contact Information: Spouse: Hamilton Capri, Daughter: [name] Anticipated Caregiver: Family  Anticipated Caregiver's Contact Information: see above  Ability/Limitations of Caregiver: None between family members someone is always there  Caregiver Availability: 24/7 Discharge Plan Discussed with Primary Caregiver: Yes Is Caregiver In Agreement with Plan?: Yes Does Caregiver/Family have Issues with Lodging/Transportation while Pt is in Rehab?: No  Goals/Additional Needs Patient/Family Goal for Rehab: PT/OT/SLP: Mod I  Expected length of stay: 8-13 days  Cultural Considerations: None Dietary Needs: Dys.3 textures and thin liquids  Equipment Needs: TBD Special Service Needs: None Pt/Family Agrees to Admission and willing to participate: Yes Program Orientation Provided & Reviewed with Pt/Caregiver Including Roles  & Responsibilities: Yes  Decrease burden of Care through IP rehab admission: No  Possible need for SNF placement upon  discharge: Not anticipated  Patient Condition: This patient's medical and functional status has changed since the consult dated: 06/16/18 at 9:15 am in which the Rehabilitation Physician determined and documented that the patient's condition is appropriate for intensive rehabilitative care in an inpatient rehabilitation facility. See "History of Present Illness" (above) for medical update. Functional changes are: Max A +2 8 feet. Patient's medical and functional status update has been discussed with the Rehabilitation physician and patient remains appropriate for inpatient rehabilitation. Will admit to inpatient rehab today.  Preadmission Screen Completed By:  Fae Pippin, 06/18/2018 2:39 PM ______________________________________________________________________   Discussed status with Dr. Riley Kill on 06/18/18 at 1455 and received telephone approval for admission today.  Admission Coordinator:  Fae Pippin, time 1455/Date 06/18/18

## 2018-06-18 NOTE — Progress Notes (Signed)
ANTICOAGULATION CONSULT NOTE - Follow Up Consult  Pharmacy Consult for warfarin Indication: stroke  Allergies  Allergen Reactions  . Lipitor [Atorvastatin] Nausea And Vomiting    Patient Measurements: Height: 6\' 2"  (188 cm) Weight: 232 lb (105.2 kg) IBW/kg (Calculated) : 82.2  Vital Signs: Temp: 98.4 F (36.9 C) (07/16 0719) Temp Source: Oral (07/16 0719) BP: 120/80 (07/16 0719) Pulse Rate: 65 (07/16 0338)  Labs: Recent Labs    06/16/18 0727 06/16/18 1405 06/17/18 0718 06/18/18 0402  HGB 14.8  --  14.2 13.4  HCT 47.1  --  45.1 41.9  PLT 375  --  392 373  LABPROT 21.1*  --  27.5* 24.4*  INR 1.85  --  2.58 2.22  HEPARINUNFRC 0.49 0.68 0.50  --   CREATININE 1.03  --  1.08 1.13    Estimated Creatinine Clearance: 86.5 mL/min (by C-G formula based on SCr of 1.13 mg/dL).  Assessment: 65 yoM on warfarin PTA for hx CVA- held on admission for a scheduled procedure, per report patient restart post-procedure, but INR was subtherapeutic on admission. Pt presented to [facility] ED with RLE numbness, dizziness, and speech deficits and noted to have left chronic cerebellar cva and acute occipital lobe CVA.  Warfarin 10mg  PO daily PTA. Questionable adherence per past notes.  Heparin stopped yesterday with therapeutic INR, INR remains in range today at 2.22.  Goal of Therapy:  INR 2-3  Monitor platelets by anticoagulation protocol: Yes   Plan:  Warfarin 10mg  po daily as per home dose Daily INR for now Follow for s/s bleeding  Ario Mcdiarmid D. Keedan Sample, PharmD, BCPS Clinical Pharmacist [phone] Please check AMION for all [facility] Pharmacy numbers 06/18/2018 9:49 AM

## 2018-06-18 NOTE — Care Management Note (Signed)
Case Management Note  Patient Details  Name: STARLING SCHUMACKER MRN: [id number] Date of Birth: Jan 17, 1956  Subjective/Objective:             Patient to DC to inpatient rehab today. No other CM needs.       Action/Plan:   Expected Discharge Date:  06/18/18               Expected Discharge Plan:  IP Rehab Facility  In-House Referral:     Discharge planning Services  CM Consult  Post Acute Care Choice:    Choice offered to:     DME Arranged:    DME Agency:     HH Arranged:    HH Agency:     Status of Service:  Completed, signed off  If discussed at [employer], dates discussed:    Additional Comments:  Lawerance Sabal, RN 06/18/2018, 1:03 PM

## 2018-06-18 NOTE — H&P (Signed)
Physical Medicine and Rehabilitation Admission H&P     Chief Complaint  Patient presents with  . 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 Patient had been off coumadin due to surgical procedure and INR 1.16 at admission. UDS negative. CTA head/neck showed hypoattenuation in right thalamus, right medial occipital and right superior cerebellum. MRI done revealing acute infarcts in R-PCA and R-SCA territories with punctate infract in L-PCA white matter felt to be embolic.  2 D echo showed EF 20% with diffuse hypokinesis and loosely organized thrombotic material at LV apex. He was started on IV heparin bridge till INR therapeutic. Dr. Roda Shutters felt to be embolic due to cardiac thrombus and heparin d/c once INR therapeutic. MBS done revealing functional swallow and he was placed on dysphagia 3, thins. He continues to be limited by balance deficits, left sided weakness with left hemianopsia and left inattention. CIR recommended due to functional decline.  Review of Systems  Constitutional: Negative for chills and fever.  HENT: Negative for hearing loss and tinnitus.  Eyes: Negative for double vision (has resolved).  Respiratory: Negative for cough and shortness of breath.  Cardiovascular: Negative for chest pain and palpitations.  Gastrointestinal: Positive for constipation. Negative for heartburn and nausea.  Genitourinary: Negative for dysuria.  Musculoskeletal: Negative for back pain, myalgias and neck pain.  Neurological: Positive for tingling (left hand) and weakness.  Psychiatric/Behavioral: The patient has insomnia.       Past Medical History:  Diagnosis Date  . Acute CVA (cerebrovascular accident) (HCC) 06/14/2018  . Coronary artery disease    blockage Stent Dr. Ladona Ridgel 15  years 1998  . Dyspnea   . Hypertension   . Stroke [facility]) 2019   denies residual on 06/14/2018  . Type II diabetes mellitus (HCC) 07/2014 dx        Past Surgical History:  Procedure Laterality Date  . CORONARY ANGIOPLASTY WITH STENT PLACEMENT  1998  . FRACTURE SURGERY    . GREEN LIGHT LASER TURP (TRANSURETHRAL RESECTION OF PROSTATE N/A 06/05/2018   Procedure: GREEN LIGHT LASER TURP , TRANSURETHRAL RESECTION OF PROSTATE; Surgeon: Crista Elliot, MD; Location: [facility]S; Service: Urology; Laterality: N/A;  . TIBIA FRACTURE SURGERY Left 1980s        Family History  Problem Relation Age of Onset  . Diabetes Mother   . Diabetes Father   . Stroke Father 80  . Diabetes Sister   . Clotting disorder Sister 23  . Diabetes Other    nephew  . Diabetes Brother   . Diabetes Maternal Grandmother    Social History: Lives with daughter and independent PTA. He reports that he quit smoking about 17 years ago. His smoking use included cigarettes. He has a 10.00 pack-year smoking history. He has never used smokeless tobacco. He reports that he quit using alcohol last year. 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      Allergies  Allergen Reactions  . Lipitor [Atorvastatin] Nausea And Vomiting         Medications Prior to Admission  Medication Sig Dispense Refill  . carvedilol (COREG) 12.5 MG tablet TAKE 1 & 1/2 (ONE & ONE-HALF) TABLETS BY MOUTH TWICE DAILY 270 tablet 2  . [EXPIRED] cephALEXin (KEFLEX) 500 MG capsule  Take 1 capsule (500 mg total) by mouth 3 (three) times daily for 5 days. 15 capsule 0  . dapagliflozin propanediol (FARXIGA) 5 MG TABS tablet Take 5 mg by mouth daily. 30 tablet 5  . enalapril (VASOTEC) 5 MG tablet TAKE 1 TABLET BY MOUTH TWICE DAILY (Patient taking differently: TAKE 1 TABLET (5mg ) BY MOUTH TWICE DAILY) 60 tablet 11  . finasteride (PROSCAR) 5 MG tablet Take 5 mg by mouth daily.    Marland Kitchen HYDROcodone-acetaminophen (NORCO/VICODIN) 5-325 MG  tablet Take 1 tablet by mouth every 4 (four) hours as needed for moderate pain. 12 tablet 0  . metFORMIN (GLUCOPHAGE) 1000 MG tablet TAKE 1 TABLET BY MOUTH TWICE DAILY WITH A MEAL (Patient taking differently: TAKE 1 TABLET (1000mg ) BY MOUTH TWICE DAILY WITH A MEAL) 90 tablet 1  . ondansetron (ZOFRAN ODT) 4 MG disintegrating tablet 4mg  ODT q4 hours prn nausea/vomit (Patient taking differently: Take 4 mg by mouth every 4 (four) hours as needed for nausea or vomiting. 4mg  ODT q4 hours prn nausea/vomit) 10 tablet 0  . tamsulosin (FLOMAX) 0.4 MG CAPS capsule Take 1 capsule (0.4 mg total) by mouth daily after supper. 90 capsule 1  . warfarin (COUMADIN) 10 MG tablet Take 10 mg by mouth daily.     Drug Regimen Review  Drug regimen was reviewed and remains appropriate with no significant issues identified  Home:  Home Living  Family/patient expects to be discharged to:: Private residence  Living Arrangements: Children, Other (Comment)  Available Help at Discharge: Family, Available 24 hours/day  Type of Home: House  Home Access: Stairs to enter  Secretary/administrator of Steps: 1  Home Layout: One level  Bathroom Shower/Tub: Medical sales representative: Handicapped height  Home Equipment: None  Lives With: Daughter(and grandson)  Functional History:  Prior Function  Level of Independence: Independent  Comments: works packing climbing ropes  Functional Status:  Mobility:  Bed Mobility  Overal bed mobility: Needs Assistance  Bed Mobility: Supine to Sit  Supine to sit: Min guard  Sit to supine: Min assist  General bed mobility comments: min guard for safety; increased time and effort  Transfers  Overall transfer level: Needs assistance  Equipment used: Rolling walker (2 wheeled)  Transfers: Sit to/from Stand  Sit to Stand: Mod assist  Squat pivot transfers: Mod assist  General transfer comment: cues for safe hand placement and assistance required for balance  Ambulation/Gait    Ambulation/Gait assistance: Max assist, +2 safety/equipment  Gait Distance (Feet): 8 Feet  Assistive device: Rolling walker (2 wheeled)  Gait Pattern/deviations: Step-through pattern, Decreased step length - right, Ataxic(atatxic L LE )  General Gait Details: cues required for sequencing, L LE placement, BOS, and finding midline posture; assistance required for balance, managing RW, and weight shifting   ADL:  ADL  Overall ADL's : Needs assistance/impaired  Eating/Feeding: Set up, Sitting  Eating/Feeding Details (indicate cue type and reason): pt noted to have ice cream on far L corner of tray and unaware of its presence.  Grooming: Wash/dry hands, Wash/dry face, Oral care, Brushing hair, Minimal assistance, Sitting  Upper Body Bathing: Moderate assistance, Sitting  Lower Body Bathing: Maximal assistance, Sit to/from stand  Upper Body Dressing : Moderate assistance, Sitting  Lower Body Dressing: Moderate assistance, Bed level  Lower Body Dressing Details (indicate cue type and reason): pt donning socks with knee flexion. pt falling to the L during attempt. pt leaning on R rail and states "let me lay this way to see if  i can get this sock on" pt with decr trunk control with LB dressing  Toilet Transfer: Moderate assistance, Financial controller Details (indicate cue type and reason): pt with incr L lean and lack of awareness to L lean during simulated transfer to the chair  Toileting- Clothing Manipulation and Hygiene: Maximal assistance, Sit to/from stand  Functional mobility during ADLs: Minimal assistance, Moderate assistance, Rolling walker  General ADL Comments: Pt requires L side guarded for all transfers. pt with L lean at 6\' 2"  and lacks awareness for self correction at this time  Cognition:  Cognition  Overall Cognitive Status: Impaired/Different from baseline  Arousal/Alertness: Awake/alert  Orientation Level: Oriented X4  Attention: Selective  Selective Attention:  Impaired  Selective Attention Impairment: Verbal complex, Functional complex  Memory: Impaired  Memory Impairment: Decreased recall of new information, Retrieval deficit, Decreased short term memory  Decreased Short Term Memory: Verbal complex, Functional complex  Awareness: Impaired  Awareness Impairment: Emergent impairment, Anticipatory impairment  Problem Solving: Impaired  Problem Solving Impairment: Verbal complex, Functional complex  Executive Function: Reasoning  Reasoning: Impaired  Reasoning Impairment: Verbal basic, Functional complex  Safety/Judgment: Appears intact  Cognition  Arousal/Alertness: Awake/alert  Behavior During Therapy: Flat affect  Overall Cognitive Status: Impaired/Different from baseline  Area of Impairment: Attention, Following commands, Safety/judgement, Awareness  Current Attention Level: Selective  Memory: Decreased recall of precautions  Following Commands: Follows one step commands with increased time  Safety/Judgement: Decreased awareness of safety, Decreased awareness of deficits  Awareness: Emergent  Problem Solving: Slow processing, Decreased initiation, Difficulty sequencing, Requires verbal cues  General Comments: Pt states "where did that sock go? I just had it" pt losing sock on L thigh applying socks and unable to problem solve where it could be . pt needed cues to scan to locate sock on L thigh. Also noted decr sensation to not feel the sock touching his bare skin  Blood pressure 120/80, pulse 65, temperature 98.4 F (36.9 C), temperature source Oral, resp. rate 18, height 6\' 2"  (1.88 m), weight 105.2 kg (232 lb), SpO2 100 %.  Physical Exam  Nursing note and vitals reviewed.  Constitutional: He appears well-developed and well-nourished.  HENT:  Head: Normocephalic and atraumatic.  Eyes: Pupils are equal, round, and reactive to light.  Neck: Normal range of motion.  Cardiovascular: Normal rate. Exam reveals no gallop and no friction rub.    No murmur heard.  Respiratory: Effort normal. No respiratory distress. He has no wheezes. He has no rales.  GI: Soft.  Genitourinary:  Genitourinary Comments: Foley in place  Musculoskeletal: Normal range of motion.  Neurological:  Left central 7. Speech intelligible, sl dysarthria. Motor 5/5 RUE and RLE. LUE 4/5 prox to distal. LLE 4+/5. No focal sensory changes. Displays reasonable insight and awareness, language normal.   Lab Results Last 48 Hours  Imaging Results (Last 48 hours)     Medical Problem List and Plan:  1. Functional deficits secondary to bilateral posterior circulation, embolic infarcts  -admit to inpatient rehab  2. DVT Prophylaxis/Anticoagulation: Pharmaceutical: Coumadin  3. Pain Management: tylenol prn  4. Mood: LCSW to follow for evaluation and support.  5. Neuropsych: This patient is capable of making decisions on his own behalf.  6. Skin/Wound Care: routine pressure relief measures.  7. Fluids/Electrolytes/Nutrition: Monitor I/O. Check lytes in am.  8. CAD with ICM: Coumadin with INR goal 2-3.Marland Kitchen  Continue coreg and Crestor.  9.T2DM: Resume metformin at 500 mg bid and titrate towards home dose. Monitor BS ac/hs and use SSI for elevated BS.  10. Hypokalemia: Improved. Will continue supplement for a couple more days.  11. HTN: Monitor BP bid--on coreg bid. Off Vasotec at this time to allow for adequate perfusion, permissive hypertension  12. TURP/BPH: ON Proscar and Flomax. Will contact Dr. Alvester Morin regarding foley. Question need to continue Keflex (started in ED 7/9).  Post Admission Physician Evaluation:  1. Functional deficits secondary to bilateral posterior circulation infarcts. 2. Patient is admitted to receive collaborative, interdisciplinary care between the physiatrist, rehab nursing staff, and therapy team. 3. Patient's level of medical complexity and substantial therapy needs in context of that medical necessity cannot be provided at a lesser intensity of care such as a SNF. 4. Patient has experienced substantial functional loss from his/her baseline which was documented above under the "Functional History" and "Functional Status" headings. Judging by the patient's diagnosis, physical exam, and functional history, the patient has potential for functional progress which will result in measurable gains while on inpatient rehab. These gains will be of substantial and practical use upon discharge in facilitating mobility and self-care at the household level. 5. Physiatrist will provide 24 hour management of medical needs as well as oversight of the therapy plan/treatment and provide guidance as appropriate regarding the interaction of the two. 6. The Preadmission Screening has been reviewed and patient status is unchanged unless otherwise stated above. 7. 24 hour rehab nursing will assist with bladder management, bowel management, safety, skin/wound care, disease management, medication administration, pain management and patient education and help integrate therapy concepts, techniques,education,  etc. 8. PT will assess and treat for/with: Lower extremity strength, range of motion, stamina, balance, functional mobility, safety, adaptive techniques and equipment, NMR, visual-spatial awareness. Goals are: mod I. 9. OT will assess and treat for/with: ADL's, functional mobility, safety, upper extremity strength, adaptive techniques and equipment, NMR, family education, community reentry. Goals are: mod I. Therapy may proceed with showering this patient. 10. SLP will assess and treat for/with: cognition, communication. Goals are: mod I. 11. Case Management and Social Worker will assess and treat for psychological issues and discharge planning. 12. Team conference will be held weekly to assess progress toward goals and to determine barriers to discharge. 13. Patient will receive at least 3 hours of therapy per day at least 5 days per week. 14. ELOS: 8-13 days  15. Prognosis: excellent  16.  I have personally performed a face to face diagnostic evaluation of this patient and formulated the key components of the plan. Additionally, I have personally reviewed laboratory data, imaging studies, as well as relevant notes and concur with the physician assistant's documentation above.   Ranelle Oyster, MD, [place] [facility]  Jacquelynn Cree, PA-C  06/18/2018

## 2018-06-18 NOTE — Progress Notes (Signed)
Physical Therapy Treatment Patient Details Name: Karl [name] MRN: [id number] DOB: 01-31-1956 Today's Date: 06/18/2018    History of Present Illness This 62 y.o. male admitted with acute onsef ot dizziness, some Rt LE numbness, and intermittent diplopia.  MRI showed Acute infarcts in both the Right PCA and Right SCA territories    PT Comments    Patient continues to make progress toward PT goals. Pt required less assistance for bed mobility and demonstrated increased use of L UE to elevate trunk into sitting. Pt does require max A +2 for safety with ambulation due to L lateral lean and ataxic L LE. Pt tolerated increased time in standing for balance activities and slight increase in gait distance in room this session. Continue to progress as tolerated.    Follow Up Recommendations  CIR     Equipment Recommendations  (TBD)    Recommendations for Other Services Rehab consult     Precautions / Restrictions Precautions Precautions: Fall Precaution Comments: poor proprioceptive awareness of Lt  Restrictions Weight Bearing Restrictions: No    Mobility  Bed Mobility Overal bed mobility: Needs Assistance Bed Mobility: Supine to Sit     Supine to sit: Min guard     General bed mobility comments: min guard for safety; increased time and effort  Transfers Overall transfer level: Needs assistance Equipment used: Rolling walker (2 wheeled) Transfers: Sit to/from Stand Sit to Stand: Mod assist         General transfer comment: cues for safe hand placement and assistance required for balance    Ambulation/Gait Ambulation/Gait assistance: Max assist;+2 safety/equipment Gait Distance (Feet): 8 Feet Assistive device: Rolling walker (2 wheeled) Gait Pattern/deviations: Step-through pattern;Decreased step length - right;Ataxic(atatxic L LE )     General Gait Details: cues required for sequencing, L LE placement, BOS, and finding midline posture; assistance required for  balance, managing RW, and weight shifting    Stairs             Wheelchair Mobility    Modified Rankin (Stroke Patients Only) Modified Rankin (Stroke Patients Only) Pre-Morbid Rankin Score: No symptoms Modified Rankin: Moderately severe disability     Balance Overall balance assessment: Needs assistance Sitting-balance support: Bilateral upper extremity supported Sitting balance-Leahy Scale: Fair     Standing balance support: Bilateral upper extremity supported Standing balance-Leahy Scale: Poor                              Cognition Arousal/Alertness: Awake/alert Behavior During Therapy: Flat affect Overall Cognitive Status: Impaired/Different from baseline Area of Impairment: Attention;Following commands;Safety/judgement;Awareness                   Current Attention Level: Selective   Following Commands: Follows one step commands with increased time Safety/Judgement: Decreased awareness of safety;Decreased awareness of deficits Awareness: Emergent Problem Solving: Slow processing;Decreased initiation;Difficulty sequencing;Requires verbal cues        Exercises General Exercises - Lower Extremity Hip Flexion/Marching: Strengthening;Standing    General Comments        Pertinent Vitals/Pain Pain Assessment: No/denies pain    Home Living     Available Help at Discharge: Family;Available 24 hours/day Type of Home: House              Prior Function            PT Goals (current goals can now be found in the care plan section) Acute Rehab PT Goals PT Goal Formulation:  With patient Time For Goal Achievement: 06/22/18 Potential to Achieve Goals: Good Progress towards PT goals: Progressing toward goals    Frequency    Min 4X/week      PT Plan Current plan remains appropriate    Co-evaluation              AM-PAC PT "6 Clicks" Daily Activity  Outcome Measure  Difficulty turning over in bed (including adjusting  bedclothes, sheets and blankets)?: A Little Difficulty moving from lying on back to sitting on the side of the bed? : A Lot Difficulty sitting down on and standing up from a chair with arms (e.g., wheelchair, bedside commode, etc,.)?: Unable Help needed moving to and from a bed to chair (including a wheelchair)?: A Lot Help needed walking in hospital room?: A Lot Help needed climbing 3-5 steps with a railing? : Total 6 Click Score: 11    End of Session Equipment Utilized During Treatment: Gait belt Activity Tolerance: Patient tolerated treatment well Patient left: with call bell/phone within reach;in chair;with chair alarm set Nurse Communication: Mobility status PT Visit Diagnosis: Unsteadiness on feet (R26.81);Other abnormalities of gait and mobility (R26.89);Repeated falls (R29.6);Muscle weakness (generalized) (M62.81);Dizziness and giddiness (R42)     Time: 1610-9604 PT Time Calculation (min) (ACUTE ONLY): 23 min  Charges:  $Gait Training: 23-37 mins                    G Codes:       Erline Levine, PTA Pager: [phone]     Carolynne Edouard 06/18/2018, 10:14 AM

## 2018-06-18 NOTE — Clinical Social Work Note (Signed)
Patient has orders and insurance approval to discharge to CIR today.  CSW signing off.  Clarinda Obi, CSW [phone]  

## 2018-06-18 NOTE — Progress Notes (Signed)
Spoke to Dr. Alvester Morin who recommended d/c of Keflex as he doubted that patient had UTI. He also recommended d/c foley in am and to start bladder training--replace foley if unable to urinate.

## 2018-06-18 NOTE — Progress Notes (Signed)
Inpatient Rehabilitation  I have insurance authorization for an IP Rehab admission and a bed to offer today.  I have also received medical clearance and will proceed with admitting patient to IP Rehab today.  Patient and family in agreement with plan.  Call if questions.   Charlane Ferretti., CCC/SLP Admission Coordinator  [facility]  Cell [phone]

## 2018-06-18 NOTE — Discharge Summary (Signed)
[facility]  Physician Discharge Summary   Patient ID: Karl [name] MRN: [id number] DOB/AGE: 01/06/1956 62 y.o.  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 Patient needs to be seen by his urologist, Dr. Alvester Morin, within the next 1 to 2 weeks to decide when to take the Foley out 2. See recommendations below regarding antibiotic Keflex as well as ACE inhibitor  DISCHARGE CONDITION: fair  Diet recommendation: Dysphagia 3 diet with thin liquids  Filed Weights   06/14/18 0958  Weight: 105.2 kg (232 lb)    INITIAL HISTORY: 62 year old African-American male with a past medical history of the artery disease, diabetes mellitus, hypertension, history of stroke earlier this year for which he was prescribed warfarin, BPH who recently underwent a TURP procedure on July 3.  His warfarin was discontinued for this surgery.  He has a Foley catheter in place.  Presented with complains of dizziness and being unsteady on his feet.  Initial evaluation raise concern for acute stroke.  Patient was hospitalized for further management.  Evaluation revealed clot in the LV apex.  Patient started on IV heparin and continued on warfarin.  Consultants: Neurology  Procedures:  Transthoracic echocardiogram Study Conclusions  - Left ventricle: The cavity size was moderately dilated. Wall thickness was normal. The estimated ejection fraction was 20%. Diffuse hypokinesis. There is akinesis of the anteroseptal and anterior myocardium. Indeterminate diastolic function. Definity contrast shows swirling contrast and loosely organized thrombotic material at the LV apex. - Aortic valve: Mildly calcified annulus. Trileaflet. - Mitral valve: There was mild regurgitation. - Right atrium: Central venous pressure (est): 3 mm  Hg. - Atrial septum: No defect or patent foramen ovale was identified. - Tricuspid valve: There was trivial regurgitation. - Pulmonary arteries: Systolic pressure could not be accurately estimated. - Pericardium, extracardiac: There was no pericardial effusion.   HOSPITAL COURSE:   Acute embolic stroke Patient seen by neurology.  Echocardiogram suggested clot in the apex.  Patient started on IV heparin.  He remains on warfarin.  INR became therapeutic.  Patient was taken off of IV heparin. CT angiogram head and neck did not show any stenosis of the carotid arteries.  LDL 182.  HbA1c 6.8. Patient was previously on warfarin for history of embolic stroke.  However warfarin was held for his prostate surgery earlier this month.  It was resumed but INR noted to be subtherapeutic.  Seen by physical and Occupational Therapy.  CIR recommended.  Patient reportedly has intolerance to atorvastatin.  Unclear if other agents have been tried.  Started on Crestor by neurology which he seems to be tolerating well.  Neurological work-up has been completed.  Await insurance approval for CIR.  Clot in the LV apex On anticoagulation with warfarin.  Nausea and vomiting Appears to have resolved.  Etiology unclear.  Could be secondary to stroke.  Could also be due to constipation and UTI.  Symptoms have improved.  He is tolerating his diet well now.  Abnormal UA with positive urine cultures Urine cultures from 7/9 was positive for Klebsiella as well as enterococcus.  Patient was prescribed a course of Keflex recently.  Sensitivities reviewed.  Klebsiella was sensitive to Keflex.  However enterococcus sensitive to Levaquin vancomycin and ampicillin.  WBC was only minimally elevated.  He was afebrile.  Patient has a indwelling Foley catheter.  His nausea vomiting  could have been due to UTI but there are other reasons present as well.    Would recommend continuing Keflex for 5 more days, until 06/23/2018.     History of BPH and urinary retention status post recent TURP on 06/05/2018 Continue Flomax and Proscar.  Continue Foley catheter.  Followed by Dr. Alvester Morin with urology.  Foley will need to be kept until he has followed up with urology.  Essential hypertension Was allowed permissive hypertension initially.  Potassium repleted.  Blood pressures are reasonably well controlled.    History of coronary artery disease Currently chest pain-free.    Continue home medications.  Chronic systolic and diastolic CHF Patient with EF of 20%.    Patient had one episode of NSVT on 7/14.  He was asymptomatic.  Patient's carvedilol was resumed.  He is noted to be on ACE inhibitor, enalapril.  Blood pressure is noted to be at times 97/75, 106/91.  I would hesitate to start him on his ACE inhibitor with these kind of blood pressure readings especially considering his stroke.  Continue to hold enalapril for now.  As he stabilizes and works through rehab and if his blood pressure remains greater than 120 systolic consistently, ACE inhibitor could be resumed. Patient has previously refused ICD placement.  Followed by Dr. Ladona Ridgel.  On chronic anticoagulation This is for previous history of embolic stroke.  Patient without any other indication for anti-coagulation.  Warfarin had to be held for his surgery on 7/3.  It was resumed however INR noted to be subtherapeutic on admission.  Patient started on IV heparin due to presence of clot at the LV apex.  INR now therapeutic.  Heparin was discontinued.  Continue warfarin.  Closely monitor INRs.  Diabetes mellitus type 2 Monitoring CBGs.  SSI.  Holding his oral agents.  Patient noted to be on Farxiga as well as metformin at home.  These could be resumed at inpatient rehab.  Constipation Stool softeners and laxatives as needed.  Overall stable.  Should be okay to go to inpatient rehabilitation when bed is available and when insurance authorization is  available.    PERTINENT LABS:  The results of significant diagnostics from this hospitalization (including imaging, microbiology, ancillary and laboratory) are listed below for reference.    Microbiology: Recent Results (from the past 240 hour(s))  Urine culture     Status: Abnormal   Collection Time: 06/11/18  3:59 PM  Result Value Ref Range Status   Specimen Description   Final    URINE, CATHETERIZED Performed at [facility], [address], [place], [place] [address]    Special Requests   Final    NONE Performed at [facility], [address], [place], [place] [address]    Culture (A)  Final    >=100,000 COLONIES/mL KLEBSIELLA PNEUMONIAE >=100,000 COLONIES/mL ENTEROCOCCUS FAECALIS    Report Status 06/14/2018 FINAL  Final   Organism ID, Bacteria KLEBSIELLA PNEUMONIAE (A)  Final   Organism ID, Bacteria ENTEROCOCCUS FAECALIS (A)  Final      Susceptibility   Enterococcus faecalis - MIC*    AMPICILLIN <=2 SENSITIVE Sensitive     LEVOFLOXACIN 1 SENSITIVE Sensitive     NITROFURANTOIN <=16 SENSITIVE Sensitive     VANCOMYCIN 1 SENSITIVE Sensitive     * >=100,000 COLONIES/mL ENTEROCOCCUS FAECALIS   Klebsiella pneumoniae - MIC*    AMPICILLIN RESISTANT Resistant     CEFAZOLIN <=4 SENSITIVE Sensitive     CEFTRIAXONE <=1 SENSITIVE Sensitive  CIPROFLOXACIN <=0.25 SENSITIVE Sensitive     GENTAMICIN <=1 SENSITIVE Sensitive     IMIPENEM <=0.25 SENSITIVE Sensitive     NITROFURANTOIN <=16 SENSITIVE Sensitive     TRIMETH/SULFA <=20 SENSITIVE Sensitive     AMPICILLIN/SULBACTAM 4 SENSITIVE Sensitive     PIP/TAZO <=4 SENSITIVE Sensitive     Extended ESBL NEGATIVE Sensitive     * >=100,000 COLONIES/mL KLEBSIELLA PNEUMONIAE     Labs: Basic Metabolic Panel: Recent Labs  Lab 06/11/18 1606 06/14/18 1018 06/16/18 0727 06/17/18 0718 06/18/18 0402  NA 139 138 135 134* 135  K 4.3 4.1 3.3* 3.3* 3.5  CL 101 96* 95* 96* 97*  CO2 21* 23 28 26 29    GLUCOSE 166* 172* 148* 160* 156*  BUN 21 30* 20 19 16   CREATININE 1.10 1.21 1.03 1.08 1.13  CALCIUM 10.0 9.7 9.2 9.1 9.1  MG  --   --   --  2.2  --    Liver Function Tests: Recent Labs  Lab 06/11/18 1606 06/14/18 1018  AST 15 13*  ALT 21 17  ALKPHOS 68 65  BILITOT 0.9 1.2  PROT 9.2* 8.8*  ALBUMIN 3.7 3.7   Recent Labs  Lab 06/11/18 1606  LIPASE 20   CBC: Recent Labs  Lab 06/11/18 1606 06/14/18 1018 06/16/18 0727 06/17/18 0718 06/18/18 0402  WBC 11.0* 10.9* 10.1 11.6* 10.0  NEUTROABS  --  7.8*  --   --   --   HGB 14.9 15.5 14.8 14.2 13.4  HCT 44.2 46.0 47.1 45.1 41.9  MCV 75.8* 75.8* 76.7* 77.0* 77.0*  PLT 398 387 375 392 373    CBG: Recent Labs  Lab 06/17/18 1636 06/17/18 2134 06/18/18 0639 06/18/18 0755 06/18/18 1131  GLUCAP 180* 217* 123* 141* 222*     IMAGING STUDIES Ct Angio Head W Or Wo Contrast  Result Date: 06/14/2018 CLINICAL DATA:  62 y/o M; right leg numbness, dizziness, slurred speech. EXAM: CT ANGIOGRAPHY HEAD AND NECK TECHNIQUE: Multidetector CT imaging of the head and neck was performed using the standard protocol during bolus administration of intravenous contrast. Multiplanar CT image reconstructions and MIPs were obtained to evaluate the vascular anatomy. Carotid stenosis measurements (when applicable) are obtained utilizing NASCET criteria, using the distal internal carotid diameter as the denominator. CONTRAST:  89mL ISOVUE-370 IOPAMIDOL (ISOVUE-370) INJECTION 76% COMPARISON:  06/14/2018 MRI of the head. 12/20/2017 MRI and MRA of the head. FINDINGS: CT HEAD FINDINGS Brain: Small chronic infarct in the left cerebellar hemisphere. Small areas of lucency in the right medial occipital lobe, right superior cerebellum, and right posterior thalamus corresponding to regions of acute infarction on MRI of the head. No evidence for new intracranial hemorrhage, stroke, or mass effect. No extra-axial collection, hydrocephalus, or herniation. Vascular: As  below. Skull: Normal. Negative for fracture or focal lesion. Sinuses: Imaged portions are clear. Orbits: No acute finding. Review of the MIP images confirms the above findings CTA NECK FINDINGS Aortic arch: Bovine variant branching. Imaged portion shows no evidence of aneurysm or dissection. No significant stenosis of the major arch vessel origins. Right carotid system: No evidence of dissection, stenosis (50% or greater) or occlusion. Minimal calcific atherosclerosis without stenosis. Left carotid system: No evidence of dissection, stenosis (50% or greater) or occlusion. Moderate carotid bifurcation mixed plaque without significant stenosis. Vertebral arteries: Left dominant. No evidence of dissection, stenosis (50% or greater) or occlusion. Skeleton: Mild cervical spondylosis. No high-grade bony canal stenosis. Other neck: Negative. Upper chest: Negative. Review of the MIP images confirms  the above findings CTA HEAD FINDINGS Anterior circulation: No significant stenosis, proximal occlusion, aneurysm, or vascular malformation. Mild calcific atherosclerosis of carotid siphons. Posterior circulation: Right PCA occlusion at the distal P1 segment (series 10, image 118). No additional occlusion, significant stenosis, or aneurysm in the posterior circulation. Venous sinuses: As permitted by contrast timing, patent. Anatomic variants: Anterior communicating artery and left posterior communicating arteries are present. No right posterior communicating artery identified, likely hypoplastic or absent. Delayed phase: No abnormal intracranial enhancement. Review of the MIP images confirms the above findings IMPRESSION: CTA head: 1. Right proximal PCA occlusion at distal P1 segment. Intermediate collateralization of right PCA distribution. 2. No additional intracranial large vessel occlusion. No aneurysm, significant stenosis, or vascular malformation. CTA neck: Patent carotid and vertebral arteries. No high-grade stenosis by  NASCET criteria, dissection, or aneurysm. CT head: 1. Areas of hypoattenuation in right thalamus, right medial occipital lobe, and right superior cerebellum corresponding to acute infarcts on MRI of the brain. 2. No new intracranial hemorrhage, stroke, or focal mass effect. These results will be called to the ordering clinician or representative by the Radiologist Assistant, and communication documented in the PACS or zVision Dashboard. Electronically Signed   By: Mitzi Hansen M.D.   On: 06/14/2018 21:38   Dg Chest 2 View  Result Date: 06/14/2018 CLINICAL DATA:  Encounter for CVA. EXAM: CHEST - 2 VIEW COMPARISON:  July 03, 2013 FINDINGS: The heart size and mediastinal contours are within normal limits. Both lungs are clear. The visualized skeletal structures are unremarkable. IMPRESSION: No active cardiopulmonary disease. Electronically Signed   By: Gerome Sam III M.D   On: 06/14/2018 17:20   Ct Head Wo Contrast  Result Date: 06/14/2018 CLINICAL DATA:  TIA. EXAM: CT HEAD WITHOUT CONTRAST TECHNIQUE: Contiguous axial images were obtained from the base of the skull through the vertex without intravenous contrast. COMPARISON:  MRI head 12/20/2017, CT head 12/19/2017 FINDINGS: Brain: Ill-defined hypodensity right occipital lobe compatible with acute infarct. This was not present previously. No associated hemorrhage Mild atrophy. Mild chronic microvascular ischemia in the white matter. Small chronic infarcts in the cerebellum bilaterally. Vascular: Negative for hyperdense vessel Skull: Negative Sinuses/Orbits: Negative Other: None IMPRESSION: Hypodensity right occipital lobe compatible with acute infarct. Negative for hemorrhage. These results were called by telephone at the time of interpretation on 06/14/2018 at 11:03 am to Dr. Linwood Dibbles , who verbally acknowledged these results. Electronically Signed   By: Marlan Palau M.D.   On: 06/14/2018 11:03   Ct Angio Neck W Or Wo Contrast  Result  Date: 06/14/2018 CLINICAL DATA:  62 y/o M; right leg numbness, dizziness, slurred speech. EXAM: CT ANGIOGRAPHY HEAD AND NECK TECHNIQUE: Multidetector CT imaging of the head and neck was performed using the standard protocol during bolus administration of intravenous contrast. Multiplanar CT image reconstructions and MIPs were obtained to evaluate the vascular anatomy. Carotid stenosis measurements (when applicable) are obtained utilizing NASCET criteria, using the distal internal carotid diameter as the denominator. CONTRAST:  50mL ISOVUE-370 IOPAMIDOL (ISOVUE-370) INJECTION 76% COMPARISON:  06/14/2018 MRI of the head. 12/20/2017 MRI and MRA of the head. FINDINGS: CT HEAD FINDINGS Brain: Small chronic infarct in the left cerebellar hemisphere. Small areas of lucency in the right medial occipital lobe, right superior cerebellum, and right posterior thalamus corresponding to regions of acute infarction on MRI of the head. No evidence for new intracranial hemorrhage, stroke, or mass effect. No extra-axial collection, hydrocephalus, or herniation. Vascular: As below. Skull: Normal. Negative for fracture or  focal lesion. Sinuses: Imaged portions are clear. Orbits: No acute finding. Review of the MIP images confirms the above findings CTA NECK FINDINGS Aortic arch: Bovine variant branching. Imaged portion shows no evidence of aneurysm or dissection. No significant stenosis of the major arch vessel origins. Right carotid system: No evidence of dissection, stenosis (50% or greater) or occlusion. Minimal calcific atherosclerosis without stenosis. Left carotid system: No evidence of dissection, stenosis (50% or greater) or occlusion. Moderate carotid bifurcation mixed plaque without significant stenosis. Vertebral arteries: Left dominant. No evidence of dissection, stenosis (50% or greater) or occlusion. Skeleton: Mild cervical spondylosis. No high-grade bony canal stenosis. Other neck: Negative. Upper chest: Negative. Review  of the MIP images confirms the above findings CTA HEAD FINDINGS Anterior circulation: No significant stenosis, proximal occlusion, aneurysm, or vascular malformation. Mild calcific atherosclerosis of carotid siphons. Posterior circulation: Right PCA occlusion at the distal P1 segment (series 10, image 118). No additional occlusion, significant stenosis, or aneurysm in the posterior circulation. Venous sinuses: As permitted by contrast timing, patent. Anatomic variants: Anterior communicating artery and left posterior communicating arteries are present. No right posterior communicating artery identified, likely hypoplastic or absent. Delayed phase: No abnormal intracranial enhancement. Review of the MIP images confirms the above findings IMPRESSION: CTA head: 1. Right proximal PCA occlusion at distal P1 segment. Intermediate collateralization of right PCA distribution. 2. No additional intracranial large vessel occlusion. No aneurysm, significant stenosis, or vascular malformation. CTA neck: Patent carotid and vertebral arteries. No high-grade stenosis by NASCET criteria, dissection, or aneurysm. CT head: 1. Areas of hypoattenuation in right thalamus, right medial occipital lobe, and right superior cerebellum corresponding to acute infarcts on MRI of the brain. 2. No new intracranial hemorrhage, stroke, or focal mass effect. These results will be called to the ordering clinician or representative by the Radiologist Assistant, and communication documented in the PACS or zVision Dashboard. Electronically Signed   By: Mitzi Hansen M.D.   On: 06/14/2018 21:38   Mr Brain Wo Contrast  Result Date: 06/14/2018 CLINICAL DATA:  62 year old male with recurrent right leg numbness since yesterday. Evidence of right PCA territory infarct on noncontrast head CT earlier today. EXAM: MRI HEAD WITHOUT CONTRAST TECHNIQUE: Multiplanar, multiecho pulse sequences of the brain and surrounding structures were obtained  without intravenous contrast. COMPARISON:  Head CT without contrast 1030 hours today. Brain MRI 12/20/2017. FINDINGS: Brain: Confluent 4-5 centimeter area of restricted diffusion throughout the medial right occipital lobe tracking toward the tail of the right hippocampus (series 4, image 19). Associated patchy and nodular restricted diffusion in the right thalamus. There is also a superimposed 2 centimeter linear area of restricted diffusion in the right superior cerebellar artery territory. Furthermore, there is a punctate solitary focus of restricted diffusion in the contralateral left occipital subcortical white matter (series 4, image 23). Early T2 and FLAIR hyperintensity in the involved areas with no associated hemorrhage or mass effect. Underlying chronic left cerebellar PICA territory infarct. Largely normal for age gray and white matter signal elsewhere. No chronic cerebral cortical encephalomalacia. No chronic cerebral blood products. No midline shift, mass effect, evidence of mass lesion, ventriculomegaly, extra-axial collection or acute intracranial hemorrhage. Cervicomedullary junction and pituitary are within normal limits. Vascular: Major intracranial vascular flow voids are stable since January and preserved. Skull and upper cervical spine: Stable and normal bone marrow signal. Negative visible cervical spine. Sinuses/Orbits: Stable and negative. Other: Trace fluid in posterior most right mastoid air cells has developed since January. The remaining bilateral mastoids remain clear.  Negative nasopharynx. Scalp and face soft tissues appear negative. IMPRESSION: 1. Acute infarcts in both the Right PCA and Right SCA territories with no associated hemorrhage or mass effect. 2. Superimposed punctate acute infarct also in Left PCA territory white matter. 3. This pattern might indicate a recent embolic shower to the posterior circulation, however, there is an underlying chronic Left PICA territory infarct.  Electronically Signed   By: Odessa Fleming M.D.   On: 06/14/2018 13:37   Dg Abd Portable 2v  Result Date: 06/15/2018 CLINICAL DATA:  Nausea and vomiting. EXAM: PORTABLE ABDOMEN - 2 VIEW COMPARISON:  07/03/2013 FINDINGS: The bowel gas pattern is normal. No obstruction or significant ileus identified. There is no evidence of free air. No radio-opaque calculi or other significant radiographic abnormality is seen. The lumbar spine demonstrates degenerative disc disease. IMPRESSION: Negative. Electronically Signed   By: Irish Lack M.D.   On: 06/15/2018 15:09    DISCHARGE EXAMINATION: Vitals:   06/17/18 2011 06/18/18 0050 06/18/18 0338 06/18/18 0719  BP: 127/88 97/75 113/73 120/80  Pulse: 84 75 65   Resp: 16 18    Temp: 98 F (36.7 C)  98 F (36.7 C) 98.4 F (36.9 C)  TempSrc: Oral  Oral Oral  SpO2: 100% 99% 100%   Weight:      Height:       General appearance: alert, cooperative, appears stated age and no distress Resp: clear to auscultation bilaterally Cardio: regular rate and rhythm, S1, S2 normal, no murmur, click, rub or gallop GI: soft, non-tender; bowel sounds normal; no masses,  no organomegaly  DISPOSITION: CIR  Discharge Instructions    Ambulatory referral to Neurology   Complete by:  As directed    Follow up with stroke clinic NP (Jessica Vanschaick or Darrol Angel, if both not available, consider Manson Allan, or [place]) at [facility] in about 4 weeks. Thanks.       Current Inpatient Medications:  Scheduled: . carvedilol  12.5 mg Oral BID WC  . cephALEXin  500 mg Oral TID  . feeding supplement (ENSURE ENLIVE)  237 mL Oral BID BM  . finasteride  5 mg Oral Daily  . insulin aspart  0-9 Units Subcutaneous TID WC  . polyethylene glycol  17 g Oral Daily  . rosuvastatin  40 mg Oral q1800  . tamsulosin  0.4 mg Oral QPC supper  . warfarin  10 mg Oral q1800  . Warfarin - Pharmacist Dosing Inpatient   Does not apply q1800   Continuous:  [id number] **OR** acetaminophen  (TYLENOL) oral liquid 160 mg/5 mL **OR** acetaminophen, HYDROcodone-acetaminophen, ondansetron, promethazine, senna-docusate, sodium phosphate    Follow-up Information    [facility]. Schedule an appointment as soon as possible for a visit in 4 week(s).   Specialty:  Radiology Contact information: [address] [place] [address] [phone]          TOTAL DISCHARGE TIME: 35 mins  Osvaldo Shipper  [facility] Pager [phone]  06/18/2018, 12:55 PM

## 2018-06-18 NOTE — Progress Notes (Addendum)
Patient ID: Karl [name], male   DOB: 08-16-1956, 62 y.o.   MRN: [id number] Admit to unit, reviewed orders, schedule and plan of care. Pamelia Hoit

## 2018-06-18 NOTE — Progress Notes (Signed)
Inpatient Rehabilitation  Met with patient and spouse at bedside to further discuss team's recommendation for IP Rehab and review information.  They are in agreement with for CIR.  I await insurance authorization prior to hopeful admission today.  Plan to update team when I know.  Call if questions.    Carmelia Roller., CCC/SLP Admission Coordinator  [place]  Cell [phone]

## 2018-06-18 NOTE — H&P (Signed)
Physical Medicine and Rehabilitation Admission H&P    Chief Complaint  Patient presents with  . 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  Patient had been off coumadin due to surgical procedure and INR 1.16 at admission. UDS negative. CTA head/neck showed hypoattenuation in right thalamus, right medial occipital and right superior cerebellum. MRI done revealing acute infarcts in R-PCA and R-SCA territories with punctate infract in L-PCA white matter felt to be embolic.   2 D echo showed EF 20% with diffuse hypokinesis and loosely organized thrombotic material at LV apex. He was started on IV heparin bridge till INR therapeutic.  [name] felt to be embolic due to cardiac thrombus and heparin d/c once INR therapeutic.  MBS done revealing functional swallow and he was placed on dysphagia 3, thins. He continues to be limited by balance deficits, left sided weakness with left hemianopsia and left inattention. CIR recommended due to functional decline.    Review of Systems  Constitutional: Negative for chills and fever.  HENT: Negative for hearing loss and tinnitus.   Eyes: Negative for double vision (has resolved).  Respiratory: Negative for cough and shortness of breath.   Cardiovascular: Negative for chest pain and palpitations.  Gastrointestinal: Positive for constipation. Negative for heartburn and nausea.  Genitourinary: Negative for dysuria.  Musculoskeletal: Negative for back pain, myalgias and neck pain.  Neurological: Positive for tingling (left hand) and weakness.  Psychiatric/Behavioral: The patient has insomnia.       Past Medical History:  Diagnosis Date  . Acute CVA (cerebrovascular accident) ([place]) 06/14/2018  . Coronary artery disease    blockage    Stent Dr. Lovena Le 15 years 1998  . Dyspnea   . Hypertension   . Stroke [facility]) 2019   denies residual on 06/14/2018  . Type II diabetes mellitus ([place]) 07/2014 dx    Past Surgical History:  Procedure Laterality Date  . CORONARY ANGIOPLASTY WITH STENT PLACEMENT  1998  . FRACTURE SURGERY    . GREEN LIGHT LASER TURP (TRANSURETHRAL RESECTION OF PROSTATE N/A 06/05/2018   Procedure: GREEN LIGHT LASER TURP , TRANSURETHRAL RESECTION OF PROSTATE;  Surgeon: [name], MD;  Location: [facility];  Service: Urology;  Laterality: N/A;  . TIBIA FRACTURE SURGERY Left 1980s    Family History  Problem Relation Age of Onset  . Diabetes Mother   . Diabetes Father   . Stroke Father 64  . Diabetes Sister   . Clotting disorder Sister 75  . Diabetes Other        nephew  . Diabetes Brother   . Diabetes Maternal Grandmother     Social History:  Lives with daughter and independent PTA. He reports that he quit smoking about 17 years ago. His smoking use included cigarettes. He has a 10.00 pack-year smoking history. He has never used smokeless tobacco. He reports that he quit using alcohol last year. 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     Allergies  Allergen Reactions  . Lipitor [Atorvastatin] Nausea And Vomiting   Medications Prior to Admission  Medication Sig Dispense Refill  . carvedilol (COREG) 12.5 MG tablet TAKE 1 & 1/2 (ONE & ONE-HALF) TABLETS BY MOUTH TWICE DAILY 270 tablet 2  . [  EXPIRED] cephALEXin (KEFLEX) 500 MG capsule Take 1 capsule (500 mg total) by mouth 3 (three) times daily for 5 days. 15 capsule 0  . dapagliflozin propanediol (FARXIGA) 5 MG TABS tablet Take 5 mg by mouth daily. 30 tablet 5  . enalapril (VASOTEC) 5 MG tablet TAKE 1 TABLET BY MOUTH TWICE DAILY (Patient taking differently: TAKE 1 TABLET (72m) BY MOUTH TWICE DAILY) 60 tablet 11  . finasteride (PROSCAR) 5 MG tablet Take 5 mg by mouth daily.    .Marland KitchenHYDROcodone-acetaminophen  (NORCO/VICODIN) 5-325 MG tablet Take 1 tablet by mouth every 4 (four) hours as needed for moderate pain. 12 tablet 0  . metFORMIN (GLUCOPHAGE) 1000 MG tablet TAKE 1 TABLET BY MOUTH TWICE DAILY WITH A MEAL (Patient taking differently: TAKE 1 TABLET (10059m BY MOUTH TWICE DAILY WITH A MEAL) 90 tablet 1  . ondansetron (ZOFRAN ODT) 4 MG disintegrating tablet 60m78mDT q4 hours prn nausea/vomit (Patient taking differently: Take 4 mg by mouth every 4 (four) hours as needed for nausea or vomiting. 60mg48mT q4 hours prn nausea/vomit) 10 tablet 0  . tamsulosin (FLOMAX) 0.4 MG CAPS capsule Take 1 capsule (0.4 mg total) by mouth daily after supper. 90 capsule 1  . warfarin (COUMADIN) 10 MG tablet Take 10 mg by mouth daily.      Drug Regimen Review  Drug regimen was reviewed and remains appropriate with no significant issues identified  Home: Home Living Family/patient expects to be discharged to:: Private residence Living Arrangements: Children, Other (Comment) Available Help at Discharge: Family, Available 24 hours/day Type of Home: House Home Access: Stairs to enter EntrTechnical brewerSteps: 1 Home Layout: One level Bathroom Shower/Tub: Tub/Chiropodistndicapped height Home Equipment: None  Lives With: Daughter(and grandson)   Functional History: Prior Function Level of Independence: Independent Comments: works packing climbing ropes   Functional Status:  Mobility: Bed Mobility Overal bed mobility: Needs Assistance Bed Mobility: Supine to Sit Supine to sit: Min guard Sit to supine: Min assist General bed mobility comments: min guard for safety; increased time and effort Transfers Overall transfer level: Needs assistance Equipment used: Rolling walker (2 wheeled) Transfers: Sit to/from Stand Sit to Stand: Mod assist Squat pivot transfers: Mod assist General transfer comment: cues for safe hand placement and assistance required for balance    Ambulation/Gait Ambulation/Gait assistance: Max assist, +2 safety/equipment Gait Distance (Feet): 8 Feet Assistive device: Rolling walker (2 wheeled) Gait Pattern/deviations: Step-through pattern, Decreased step length - right, Ataxic(atatxic L LE ) General Gait Details: cues required for sequencing, L LE placement, BOS, and finding midline posture; assistance required for balance, managing RW, and weight shifting     ADL: ADL Overall ADL's : Needs assistance/impaired Eating/Feeding: Set up, Sitting Eating/Feeding Details (indicate cue type and reason): pt noted to have ice cream on far L corner of tray and unaware of its presence.  Grooming: Wash/dry hands, Wash/dry face, Oral care, Brushing hair, Minimal assistance, Sitting Upper Body Bathing: Moderate assistance, Sitting Lower Body Bathing: Maximal assistance, Sit to/from stand Upper Body Dressing : Moderate assistance, Sitting Lower Body Dressing: Moderate assistance, Bed level Lower Body Dressing Details (indicate cue type and reason): pt donning socks with knee flexion. pt falling to the L during attempt. pt leaning on R rail and states "let me lay this way to see if i can get this sock on" pt with decr trunk control with LB dressing Toilet Transfer: Moderate assistance, StanBuyer, retailails (indicate cue type and reason): pt with incr  L lean and lack of awareness to L lean during simulated transfer to the chair Toileting- Clothing Manipulation and Hygiene: Maximal assistance, Sit to/from stand Functional mobility during ADLs: Minimal assistance, Moderate assistance, Rolling walker General ADL Comments: Pt requires L side guarded for all transfers. pt with L lean at _0  and lacks awareness for self correction at this time  Cognition: Cognition Overall Cognitive Status: Impaired/Different from baseline Arousal/Alertness: Awake/alert Orientation Level: Oriented X4 Attention: Selective Selective Attention:  Impaired Selective Attention Impairment: Verbal complex, Functional complex Memory: Impaired Memory Impairment: Decreased recall of new information, Retrieval deficit, Decreased short term memory Decreased Short Term Memory: Verbal complex, Functional complex Awareness: Impaired Awareness Impairment: Emergent impairment, Anticipatory impairment Problem Solving: Impaired Problem Solving Impairment: Verbal complex, Functional complex Executive Function: Reasoning Reasoning: Impaired Reasoning Impairment: Verbal basic, Functional complex Safety/Judgment: Appears intact Cognition Arousal/Alertness: Awake/alert Behavior During Therapy: Flat affect Overall Cognitive Status: Impaired/Different from baseline Area of Impairment: Attention, Following commands, Safety/judgement, Awareness Current Attention Level: Selective Memory: Decreased recall of precautions Following Commands: Follows one step commands with increased time Safety/Judgement: Decreased awareness of safety, Decreased awareness of deficits Awareness: Emergent Problem Solving: Slow processing, Decreased initiation, Difficulty sequencing, Requires verbal cues General Comments: Pt states "where did that sock go? I just had it" pt losing sock on L thigh applying socks and unable to problem solve where it could be . pt needed cues to scan to locate sock on L thigh. Also noted decr sensation to not feel the sock touching his bare skin   Blood pressure 120/80, pulse 65, temperature 98.4 F (36.9 C), temperature source Oral, resp. rate 18, height _1  (1.88 m), weight 105.2 kg (232 lb), SpO2 100 %. Physical Exam  Nursing note and vitals reviewed. Constitutional: He appears well-developed and well-nourished.  HENT:  Head: Normocephalic and atraumatic.  Eyes: Pupils are equal, round, and reactive to light.  Neck: Normal range of motion.  Cardiovascular: Normal rate. Exam reveals no gallop and no friction rub.  No murmur  heard. Respiratory: Effort normal. No respiratory distress. He has no wheezes. He has no rales.  GI: Soft.  Genitourinary:  Genitourinary Comments: Foley in place  Musculoskeletal: Normal range of motion.  Neurological:  Left central 7. Speech intelligible, sl dysarthria. Motor 5/5 RUE and RLE. LUE 4/5 prox to distal. LLE 4+/5. No focal sensory changes. Displays reasonable insight and awareness, language normal.     Results for orders placed or performed during the hospital encounter of 06/14/18 (from the past 48 hour(s))  Heparin level (unfractionated)     Status: None   Collection Time: 06/16/18  2:05 PM  Result Value Ref Range   Heparin Unfractionated 0.68 0.30 - 0.70 IU/mL    Comment: (NOTE) If heparin results are below expected values, and patient dosage has  been confirmed, suggest follow up testing of antithrombin III levels. Performed at [facility], [place] [address], [place], [place] [address]   Glucose, capillary     Status: Abnormal   Collection Time: 06/16/18  4:42 PM  Result Value Ref Range   Glucose-Capillary 168 (H) 70 - 99 mg/dL   Comment 1 Notify RN    Comment 2 Document in Chart   Glucose, capillary     Status: Abnormal   Collection Time: 06/16/18  9:17 PM  Result Value Ref Range   Glucose-Capillary 189 (H) 70 - 99 mg/dL  Glucose, capillary     Status: Abnormal   Collection Time: 06/17/18  5:27 AM  Result Value Ref Range   Glucose-Capillary 180 (H) 70 - 99 mg/dL  Heparin level (unfractionated)     Status: None   Collection Time: 06/17/18  7:18 AM  Result Value Ref Range   Heparin Unfractionated 0.50 0.30 - 0.70 IU/mL    Comment: (NOTE) If heparin results are below expected values, and patient dosage has  been confirmed, suggest follow up testing of antithrombin III levels. Performed at [facility], [place] [address] [place], [place] [address]   CBC     Status: Abnormal   Collection Time: 06/17/18  7:18 AM  Result Value Ref Range   WBC 11.6  (H) 4.0 - 10.5 K/uL   RBC 5.86 (H) 4.22 - 5.81 MIL/uL   Hemoglobin 14.2 13.0 - 17.0 g/dL   HCT 45.1 39.0 - 52.0 %   MCV 77.0 (L) 78.0 - 100.0 fL   MCH 24.2 (L) 26.0 - 34.0 pg   MCHC 31.5 30.0 - 36.0 g/dL   RDW 14.1 11.5 - 15.5 %   Platelets 392 150 - 400 K/uL    Comment: Performed at [facility], [place] [address] [place], [place] [address]  Protime-INR     Status: Abnormal   Collection Time: 06/17/18  7:18 AM  Result Value Ref Range   Prothrombin Time 27.5 (H) 11.4 - 15.2 seconds   INR 2.58     Comment: Performed at [facility] [address], [place], [place] [address]  Basic metabolic panel     Status: Abnormal   Collection Time: 06/17/18  7:18 AM  Result Value Ref Range   Sodium 134 (L) 135 - 145 mmol/L   Potassium 3.3 (L) 3.5 - 5.1 mmol/L   Chloride 96 (L) 98 - 111 mmol/L    Comment: Please note change in reference range.   CO2 26 22 - 32 mmol/L   Glucose, Bld 160 (H) 70 - 99 mg/dL    Comment: Please note change in reference range.   BUN 19 8 - 23 mg/dL    Comment: Please note change in reference range.   Creatinine, Ser 1.08 0.61 - 1.24 mg/dL   Calcium 9.1 8.9 - 10.3 mg/dL   GFR calc non Af Amer >60 >60 mL/min   GFR calc Af Amer >60 >60 mL/min    Comment: (NOTE) The eGFR has been calculated using the CKD EPI equation. This calculation has not been validated in all clinical situations. eGFR's persistently <60 mL/min signify possible Chronic Kidney Disease.    Anion gap 12 5 - 15    Comment: Performed at [facility] [address] [place], [place] [address]  Magnesium     Status: None   Collection Time: 06/17/18  7:18 AM  Result Value Ref Range   Magnesium 2.2 1.7 - 2.4 mg/dL    Comment: Performed at [facility] [address], [place], [place] [address]  Glucose, capillary     Status: Abnormal   Collection Time: 06/17/18  7:35 AM  Result Value Ref Range   Glucose-Capillary 172 (H) 70 - 99 mg/dL   Comment 1 Document in Chart    Glucose, capillary     Status: Abnormal   Collection Time: 06/17/18 12:13 PM  Result Value Ref Range   Glucose-Capillary 230 (H) 70 - 99 mg/dL   Comment 1 Document in Chart   Glucose, capillary     Status: Abnormal   Collection Time: 06/17/18  4:36 PM  Result Value  Ref Range   Glucose-Capillary 180 (H) 70 - 99 mg/dL  Glucose, capillary     Status: Abnormal   Collection Time: 06/17/18  9:34 PM  Result Value Ref Range   Glucose-Capillary 217 (H) 70 - 99 mg/dL   Comment 1 Notify RN    Comment 2 Document in Chart   CBC     Status: Abnormal   Collection Time: 06/18/18  4:02 AM  Result Value Ref Range   WBC 10.0 4.0 - 10.5 K/uL   RBC 5.44 4.22 - 5.81 MIL/uL   Hemoglobin 13.4 13.0 - 17.0 g/dL   HCT 41.9 39.0 - 52.0 %   MCV 77.0 (L) 78.0 - 100.0 fL   MCH 24.6 (L) 26.0 - 34.0 pg   MCHC 32.0 30.0 - 36.0 g/dL   RDW 14.1 11.5 - 15.5 %   Platelets 373 150 - 400 K/uL    Comment: Performed at [facility], [place] [address] [place], [place] [address]  Protime-INR     Status: Abnormal   Collection Time: 06/18/18  4:02 AM  Result Value Ref Range   Prothrombin Time 24.4 (H) 11.4 - 15.2 seconds   INR 2.22     Comment: Performed at [facility] [address] [place], [place] [address]  Basic metabolic panel     Status: Abnormal   Collection Time: 06/18/18  4:02 AM  Result Value Ref Range   Sodium 135 135 - 145 mmol/L   Potassium 3.5 3.5 - 5.1 mmol/L   Chloride 97 (L) 98 - 111 mmol/L    Comment: Please note change in reference range.   CO2 29 22 - 32 mmol/L   Glucose, Bld 156 (H) 70 - 99 mg/dL    Comment: Please note change in reference range.   BUN 16 8 - 23 mg/dL    Comment: Please note change in reference range.   Creatinine, Ser 1.13 0.61 - 1.24 mg/dL   Calcium 9.1 8.9 - 10.3 mg/dL   GFR calc non Af Amer >60 >60 mL/min   GFR calc Af Amer >60 >60 mL/min    Comment: (NOTE) The eGFR has been calculated using the CKD EPI equation. This calculation has not been validated  in all clinical situations. eGFR's persistently <60 mL/min signify possible Chronic Kidney Disease.    Anion gap 9 5 - 15    Comment: Performed at [facility] [address], [place], [place] [address]  Glucose, capillary     Status: Abnormal   Collection Time: 06/18/18  6:39 AM  Result Value Ref Range   Glucose-Capillary 123 (H) 70 - 99 mg/dL  Glucose, capillary     Status: Abnormal   Collection Time: 06/18/18  7:55 AM  Result Value Ref Range   Glucose-Capillary 141 (H) 70 - 99 mg/dL   Comment 1 Notify RN    Comment 2 Document in Chart   Glucose, capillary     Status: Abnormal   Collection Time: 06/18/18 11:31 AM  Result Value Ref Range   Glucose-Capillary 222 (H) 70 - 99 mg/dL   Comment 1 Notify RN    Comment 2 Document in Chart    No results found.     Medical Problem List and Plan: 1.  Functional deficits secondary to bilateral posterior circulation, embolic infarcts  -admit to inpatient rehab 2.  DVT Prophylaxis/Anticoagulation: Pharmaceutical: Coumadin 3. Pain Management: tylenol prn 4. Mood: LCSW to follow for evaluation and support.  5. Neuropsych: This patient  is capable of making decisions on his own behalf. 6. Skin/Wound Care: routine pressure relief measures.  7. Fluids/Electrolytes/Nutrition: Monitor I/O. Check lytes in am.  8. CAD with ICM: Coumadin with INR goal 2-3.Marland Kitchen Continue coreg and Crestor.  9.T2DM: Resume metformin at 500 mg bid and titrate towards home dose. Monitor BS ac/hs and use SSI for elevated BS.  10. Hypokalemia: Improved. Will continue supplement for a couple more days. 11. HTN: Monitor BP bid--on coreg bid. Off Vasotec at this time to allow for adequate perfusion, permissive hypertension  12. TURP/BPH: ON Proscar and Flomax. Will contact Dr. Gloriann Loan regarding foley. Question need to continue Keflex (started in ED 7/9).   Post Admission Physician Evaluation: 1. Functional deficits secondary  to bilateral posterior circulation  infarcts. 2. Patient is admitted to receive collaborative, interdisciplinary care between the physiatrist, rehab nursing staff, and therapy team. 3. Patient's level of medical complexity and substantial therapy needs in context of that medical necessity cannot be provided at a lesser intensity of care such as a SNF. 4. Patient has experienced substantial functional loss from his/her baseline which was documented above under the "Functional History" and "Functional Status" headings.  Judging by the patient's diagnosis, physical exam, and functional history, the patient has potential for functional progress which will result in measurable gains while on inpatient rehab.  These gains will be of substantial and practical use upon discharge  in facilitating mobility and self-care at the household level. 5. Physiatrist will provide 24 hour management of medical needs as well as oversight of the therapy plan/treatment and provide guidance as appropriate regarding the interaction of the two. 6. The Preadmission Screening has been reviewed and patient status is unchanged unless otherwise stated above. 7. 24 hour rehab nursing will assist with bladder management, bowel management, safety, skin/wound care, disease management, medication administration, pain management and patient education  and help integrate therapy concepts, techniques,education, etc. 8. PT will assess and treat for/with: Lower extremity strength, range of motion, stamina, balance, functional mobility, safety, adaptive techniques and equipment, NMR, visual-spatial awareness.   Goals are: mod I. 9. OT will assess and treat for/with: ADL's, functional mobility, safety, upper extremity strength, adaptive techniques and equipment, NMR, family education, community reentry.   Goals are: mod I. Therapy may proceed with showering this patient. 10. SLP will assess and treat for/with: cognition, communication.  Goals are: mod I. 11. Case Management and  Social Worker will assess and treat for psychological issues and discharge planning. 12. Team conference will be held weekly to assess progress toward goals and to determine barriers to discharge. 13. Patient will receive at least 3 hours of therapy per day at least 5 days per week. 14. ELOS: 8-13 days       15. Prognosis:  excellent   I have personally performed a face to face diagnostic evaluation of this patient and formulated the key components of the plan.  Additionally, I have personally reviewed laboratory data, imaging studies, as well as relevant notes and concur with the physician assistant's documentation above.  Meredith Staggers, MD, [facility]     Bary Leriche, PA-C 06/18/2018

## 2018-06-18 NOTE — Evaluation (Signed)
Speech Language Pathology Evaluation Patient Details Name: Karl [name] MRN: [id number] DOB: 01/14/1956 Today's Date: 06/18/2018 Time: 6606-3016 SLP Time Calculation (min) (ACUTE ONLY): 17 min  Problem List:  Patient Active Problem List   Diagnosis Date Noted  . Ischemic cardiomyopathy   . CVA (cerebral vascular accident) (HCC) 06/14/2018  . Urinary retention 06/05/2018  . Encounter for therapeutic drug monitoring 02/08/2018  . History of CVA (cerebrovascular accident) 12/20/2017  . Coronary artery disease 12/19/2017  . Mild renal insufficiency 12/19/2017  . TIA (transient ischemic attack) 12/19/2017  . Hip flexor tendinitis 05/22/2017  . Abscess of back 05/22/2017  . Pain of both hip joints 04/25/2017  . Dyslipidemia 09/01/2015  . BPH without urinary obstruction 09/01/2015  . DM (diabetes mellitus), type 2, uncontrolled, periph vascular complic (HCC) 07/10/2014  . Essential hypertension 04/20/2009  . CARDIOMYOPATHY, ISCHEMIC 04/20/2009  . LEFT BUNDLE BRANCH BLOCK 04/20/2009  . Chronic systolic heart failure (HCC) 04/20/2009   Past Medical History:  Past Medical History:  Diagnosis Date  . Acute CVA (cerebrovascular accident) (HCC) 06/14/2018  . Coronary artery disease    blockage   Stent Dr. Ladona Ridgel 15 years 1998  . Dyspnea   . Hypertension   . Stroke [facility]) 2019   denies residual on 06/14/2018  . Type II diabetes mellitus (HCC) 07/2014 dx   Past Surgical History:  Past Surgical History:  Procedure Laterality Date  . CORONARY ANGIOPLASTY WITH STENT PLACEMENT  1998  . FRACTURE SURGERY    . GREEN LIGHT LASER TURP (TRANSURETHRAL RESECTION OF PROSTATE N/A 06/05/2018   Procedure: GREEN LIGHT LASER TURP , TRANSURETHRAL RESECTION OF PROSTATE;  Surgeon: Crista Elliot, MD;  Location: [facility];  Service: Urology;  Laterality: N/A;  . 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  Pt with left hemianopsia and left hemiparesis.  Had issues with n/v prior to admit but he denies further episodes.     Assessment / Plan / Recommendation Clinical Impression  Pt presents with moderate cognitive deficits as evidenced by score of 10 out of 22 on MOCA Blind (n=> 18). Pt with deficits in selective attention in mildlyg distracting environment, semi-complex problem solving, recall of new information, increased processsing time and left inattention. Pt's left inattetnion appears somewhat improved but continues to effect his functional independence. Pt reports that although he lived with his daughter, he was independent with money and medication management and all ADLs. At this time, pt would not be able to perform these tasks with any level of independence. Therefore skilled ST is required to target the above mentioned deficits and increase functional independence. Continue to recommend CIR for intensive ST for safest discharge plan.     SLP Assessment  SLP Recommendation/Assessment: Patient needs continued Speech Lanaguage Pathology Services SLP Visit Diagnosis: Cognitive communication deficit (R41.841)    Follow Up Recommendations  Inpatient Rehab    Frequency and Duration min 2x/week  2 weeks      SLP Evaluation Cognition  Overall Cognitive Status: Impaired/Different from baseline Arousal/Alertness: Awake/alert Orientation Level: Oriented X4 Attention: Selective Selective Attention: Impaired Selective Attention Impairment: Verbal complex;Functional complex Memory: Impaired Memory Impairment: Decreased recall of new information;Retrieval deficit;Decreased short term memory Decreased Short Term Memory: Verbal complex;Functional complex Awareness: Impaired Awareness Impairment: Emergent impairment;Anticipatory impairment Problem Solving: Impaired Problem Solving Impairment: Verbal  complex;Functional  complex Executive Function: Reasoning Reasoning: Impaired Reasoning Impairment: Verbal basic;Functional complex Safety/Judgment: Appears intact       Comprehension  Auditory Comprehension Overall Auditory Comprehension: Appears within functional limits for tasks assessed Visual Recognition/Discrimination Discrimination: (left inattention) Reading Comprehension Reading Status: Not tested    Expression Expression Primary Mode of Expression: Verbal Verbal Expression Overall Verbal Expression: Appears within functional limits for tasks assessed Pragmatics: No impairment Non-Verbal Means of Communication: Not applicable Written Expression Dominant Hand: Right Written Expression: Not tested   Oral / Motor  Oral Motor/Sensory Function Overall Oral Motor/Sensory Function: Within functional limits Motor Speech Overall Motor Speech: Appears within functional limits for tasks assessed Respiration: Within functional limits Phonation: Normal Resonance: Within functional limits Articulation: Within functional limitis Intelligibility: Intelligible Motor Planning: Witnin functional limits Motor Speech Errors: Not applicable   GO                    Regino Fournet 06/18/2018, 9:28 AM

## 2018-06-19 ENCOUNTER — Other Ambulatory Visit: Payer: Self-pay

## 2018-06-19 ENCOUNTER — Inpatient Hospital Stay (HOSPITAL_COMMUNITY): Payer: Managed Care, Other (non HMO) | Admitting: Speech Pathology

## 2018-06-19 ENCOUNTER — Inpatient Hospital Stay (HOSPITAL_COMMUNITY): Payer: Managed Care, Other (non HMO) | Admitting: Occupational Therapy

## 2018-06-19 ENCOUNTER — Inpatient Hospital Stay (HOSPITAL_COMMUNITY): Payer: Managed Care, Other (non HMO) | Admitting: Physical Therapy

## 2018-06-19 LAB — COMPREHENSIVE METABOLIC PANEL
ALBUMIN: 2.8 g/dL — AB (ref 3.5–5.0)
ALT: 17 U/L (ref 0–44)
ANION GAP: 10 (ref 5–15)
AST: 14 U/L — ABNORMAL LOW (ref 15–41)
Alkaline Phosphatase: 58 U/L (ref 38–126)
BILIRUBIN TOTAL: 0.6 mg/dL (ref 0.3–1.2)
BUN: 19 mg/dL (ref 8–23)
CO2: 26 mmol/L (ref 22–32)
Calcium: 9 mg/dL (ref 8.9–10.3)
Chloride: 96 mmol/L — ABNORMAL LOW (ref 98–111)
Creatinine, Ser: 1.27 mg/dL — ABNORMAL HIGH (ref 0.61–1.24)
GFR calc Af Amer: >60 mL/min (ref >60)
GFR calc non Af Amer: 58 mL/min — ABNORMAL LOW (ref >60)
GLUCOSE: 160 mg/dL — AB (ref 70–99)
Potassium: 4 mmol/L (ref 3.5–5.1)
Sodium: 132 mmol/L — ABNORMAL LOW (ref 135–145)
Total Protein: 7 g/dL (ref 6.5–8.1)

## 2018-06-19 LAB — GLUCOSE, CAPILLARY
GLUCOSE-CAPILLARY: 189 mg/dL — AB (ref 70–99)
GLUCOSE-CAPILLARY: 219 mg/dL — AB (ref 70–99)
Glucose-Capillary: 144 mg/dL — ABNORMAL HIGH (ref 70–99)
Glucose-Capillary: 133 mg/dL — ABNORMAL HIGH (ref 70–99)

## 2018-06-19 LAB — CBC WITH DIFFERENTIAL/PLATELET
ABS IMMATURE GRANULOCYTES: 0.1 10*3/uL (ref 0.0–0.1)
Basophils Absolute: 0 10*3/uL (ref 0.0–0.1)
Basophils Relative: 0 %
EOS PCT: 4 %
Eosinophils Absolute: 0.4 10*3/uL (ref 0.0–0.7)
HEMATOCRIT: 43.1 % (ref 39.0–52.0)
Hemoglobin: 13.8 g/dL (ref 13.0–17.0)
Immature Granulocytes: 1 %
LYMPHS ABS: 1.5 10*3/uL (ref 0.7–4.0)
LYMPHS PCT: 18 %
MCH: 24.5 pg — ABNORMAL LOW (ref 26.0–34.0)
MCHC: 32 g/dL (ref 30.0–36.0)
MCV: 76.6 fL — ABNORMAL LOW (ref 78.0–100.0)
MONO ABS: 1 10*3/uL (ref 0.1–1.0)
Monocytes Relative: 12 %
Neutro Abs: 5.4 10*3/uL (ref 1.7–7.7)
Neutrophils Relative %: 65 %
Platelets: 370 10*3/uL (ref 150–400)
RBC: 5.63 MIL/uL (ref 4.22–5.81)
RDW: 13.9 % (ref 11.5–15.5)
WBC: 8.4 10*3/uL (ref 4.0–10.5)

## 2018-06-19 LAB — PROTIME-INR
INR: 1.95
Prothrombin Time: 22.1 seconds — ABNORMAL HIGH (ref 11.4–15.2)

## 2018-06-19 MED ORDER — NYSTATIN 100000 UNIT/ML MT SUSP
5.0000 mL | Freq: Four times a day (QID) | OROMUCOSAL | Status: DC
  Administered 2018-06-19 – 2018-07-09 (×76): 500000 [IU] via ORAL
  Filled 2018-06-19 (×76): qty 5

## 2018-06-19 NOTE — Progress Notes (Signed)
Grand daughter at bedside, informed this nurse that she will be the one taking care of pt when he rtns home.  She reports that pt lives with her aunt but she will be pt caregiver.

## 2018-06-19 NOTE — Evaluation (Signed)
Speech Language Pathology Assessment and Plan  Patient Details  Name: Karl [name] MRN: [id number] Date of Birth: July 02, 1956  SLP Diagnosis: Cognitive Impairments  Rehab Potential: Excellent ELOS: 14-17 days     Today's Date: 06/19/2018 SLP Individual Time: 1350-1455 SLP Individual Time Calculation (min): 65 min   Problem List:  Patient Active Problem List   Diagnosis Date Noted  . Acute ischemic right PCA stroke ([place]) 06/18/2018  . Ischemic cardiomyopathy   . CVA (cerebral vascular accident) ([place]) 06/14/2018  . Urinary retention 06/05/2018  . Encounter for therapeutic drug monitoring 02/08/2018  . History of CVA (cerebrovascular accident) 12/20/2017  . Coronary artery disease 12/19/2017  . Mild renal insufficiency 12/19/2017  . TIA (transient ischemic attack) 12/19/2017  . Hip flexor tendinitis 05/22/2017  . Abscess of back 05/22/2017  . Pain of both hip joints 04/25/2017  . Dyslipidemia 09/01/2015  . BPH without urinary obstruction 09/01/2015  . DM (diabetes mellitus), type 2, uncontrolled, periph vascular complic ([place]) 37/62/8315  . Essential hypertension 04/20/2009  . CARDIOMYOPATHY, ISCHEMIC 04/20/2009  . LEFT BUNDLE BRANCH BLOCK 04/20/2009  . Chronic systolic heart failure ([place]) 04/20/2009   Past Medical History:  Past Medical History:  Diagnosis Date  . Acute CVA (cerebrovascular accident) ([place]) 06/14/2018  . Coronary artery disease    blockage   Stent Dr. Lovena Le 15 years 1998  . Dyspnea   . Hypertension   . Stroke [facility]) 2019   denies residual on 06/14/2018  . Type II diabetes mellitus ([place]) 07/2014 dx   Past Surgical History:  Past Surgical History:  Procedure Laterality Date  . CORONARY ANGIOPLASTY WITH STENT PLACEMENT  1998  . FRACTURE SURGERY    . GREEN LIGHT LASER TURP (TRANSURETHRAL RESECTION OF PROSTATE N/A 06/05/2018   Procedure: GREEN LIGHT LASER TURP , TRANSURETHRAL RESECTION OF PROSTATE;  Surgeon: [name], MD;  Location: [facility];   Service: Urology;  Laterality: N/A;  . TIBIA FRACTURE SURGERY Left 1980s    Assessment / Plan / Recommendation Clinical Impression Patient is a 62 y.o. year old male with recent admission to the hospital on  62 year old male with history of T2DM, CAD with ICM on chronic coumadin, prior CVA 12/2017, TURP 06/05/18 for BPH who was admitted on 06/14/18 with dizziness, RLE numbness, double vision and balance deficits. CTA head/neck showed hypoattenuation in right thalamus, right medial occipital and right superior cerebellum. MRI done revealing acute infarcts in R-PCA and R-SCA territories with punctate infract in L-PCA white matter felt to be embolic. Patient transferred to CIR on 06/18/2018 .     Patient demonstrates mild cognitive impairments characterized by impaired problem solving, recall and attention to left field of environment which impacts his ability to complete functional and familiar tasks safely. Patient also demonstrates a hoarse vocal quality, however, it does not impact his intelligibility. Patient consumed thin liquids via straw and Dys. 1 textures with prolonged oral prep phase with multiple swallows without overt s/s of aspiration. Patient declined trials of regular textures due to fear of "food getting hung up" and requested to downgrade to softer textures per his preference and requested medications be crushed. Recommend patient downgrade to Dys. 2 textures and continue thin liquids. Patient would benefit from skilled SLP intervention to maximize his cognitive and swallowing function and overall functional independence.    Skilled Therapeutic Interventions          Administered a BSE and cognitive-linguistic evaluation, please see above for details. Educated patient in regards to current  swallowing and cognitive-linguistic function and goals of skilled SLP intervention, patient verbalized understanding and agreement.    SLP Assessment  Patient will need skilled Speech Lanaguage Pathology  Services during CIR admission    Recommendations  SLP Diet Recommendations: Dysphagia 2 (Fine chop);Thin Liquid Administration via: Cup;Straw Medication Administration: Crushed with puree Supervision: Patient able to self feed;Intermittent supervision to cue for compensatory strategies Compensations: Small sips/bites;Slow rate Postural Changes and/or Swallow Maneuvers: Upright 30-60 min after meal;Seated upright 90 degrees Oral Care Recommendations: Oral care BID Patient destination: Home Follow up Recommendations: Home Health SLP;24 hour supervision/assistance Equipment Recommended: None recommended by SLP    SLP Frequency 3 to 5 out of 7 days   SLP Duration  SLP Intensity  SLP Treatment/Interventions 14-17 days   Minumum of 1-2 x/day, 30 to 90 minutes  Cognitive remediation/compensation;Cueing hierarchy;Environmental controls;Functional tasks;Patient/family education;Therapeutic Activities;Internal/external aids    Pain No/Denies Pain  Function:  Eating Eating   Modified Consistency Diet: Yes Eating Assist Level: Set up assist for;More than reasonable amount of time;Supervision or verbal cues   Eating Set Up Assist For: Opening containers       Cognition Comprehension Comprehension assist level: Understands basic 90% of the time/cues < 10% of the time  Expression   Expression assist level: Expresses basic 90% of the time/requires cueing < 10% of the time.  Social Interaction Social Interaction assist level: Interacts appropriately 90% of the time - Needs monitoring or encouragement for participation or interaction.  Problem Solving Problem solving assist level: Solves basic 75 - 89% of the time/requires cueing 10 - 24% of the time  Memory Memory assist level: Recognizes or recalls 75 - 89% of the time/requires cueing 10 - 24% of the time   Short Term Goals: Week 1: SLP Short Term Goal 1 (Week 1): Patient will consume current diet with minimal overt s/s of aspiration  or globus sensation with Mod I.  SLP Short Term Goal 2 (Week 1): Patient will demonstrate functional problem solving for mildly complex tasks with Min A verbal cues.  SLP Short Term Goal 3 (Week 1): Patient will recall new, daily information with supervision verbal cues.  SLP Short Term Goal 4 (Week 1): Patient will attend to left field of enviornment during functional tasks with supervision verbal cues.   Refer to Care Plan for Long Term Goals  Recommendations for other services: None   Discharge Criteria: Patient will be discharged from SLP if patient refuses treatment 3 consecutive times without medical reason, if treatment goals not met, if there is a change in medical status, if patient makes no progress towards goals or if patient is discharged from hospital.  The above assessment, treatment plan, treatment alternatives and goals were discussed and mutually agreed upon: by patient  Kinzleigh Kandler 06/19/2018, 3:26 PM

## 2018-06-19 NOTE — Evaluation (Signed)
Physical Therapy Assessment and Plan  Patient Details  Name: Karl [name] MRN: [id number] Date of Birth: 1956-04-30  PT Diagnosis: Abnormal posture, Abnormality of gait, Ataxia, Ataxic gait, Coordination disorder, Hemiplegia non-dominant, Impaired cognition and Muscle weakness Rehab Potential: Good ELOS: 14-18 days    Today's Date: 06/19/2018 PT Individual Time: 0800-0915 PT Individual Time Calculation (min): 75 min    Problem List:  Patient Active Problem List   Diagnosis Date Noted  . Acute ischemic right PCA stroke ([name]) 06/18/2018  . Ischemic cardiomyopathy   . CVA (cerebral vascular accident) ([name]) 06/14/2018  . Urinary retention 06/05/2018  . Encounter for therapeutic drug monitoring 02/08/2018  . History of CVA (cerebrovascular accident) 12/20/2017  . Coronary artery disease 12/19/2017  . Mild renal insufficiency 12/19/2017  . TIA (transient ischemic attack) 12/19/2017  . Hip flexor tendinitis 05/22/2017  . Abscess of back 05/22/2017  . Pain of both hip joints 04/25/2017  . Dyslipidemia 09/01/2015  . BPH without urinary obstruction 09/01/2015  . DM (diabetes mellitus), type 2, uncontrolled, periph vascular complic ([name]) 82/99/3716  . Essential hypertension 04/20/2009  . CARDIOMYOPATHY, ISCHEMIC 04/20/2009  . LEFT BUNDLE BRANCH BLOCK 04/20/2009  . Chronic systolic heart failure ([name]) 04/20/2009    Past Medical History:  Past Medical History:  Diagnosis Date  . Acute CVA (cerebrovascular accident) ([name]) 06/14/2018  . Coronary artery disease    blockage   Stent Dr. Lovena Le 15 years 1998  . Dyspnea   . Hypertension   . Stroke [name]) 2019   denies residual on 06/14/2018  . Type II diabetes mellitus ([name]) 07/2014 dx   Past Surgical History:  Past Surgical History:  Procedure Laterality Date  . CORONARY ANGIOPLASTY WITH STENT PLACEMENT  1998  . FRACTURE SURGERY    . GREEN LIGHT LASER TURP (TRANSURETHRAL RESECTION OF PROSTATE N/A 06/05/2018   Procedure: GREEN LIGHT  LASER TURP , TRANSURETHRAL RESECTION OF PROSTATE;  Surgeon: [name], MD;  Location: [facility];  Service: Urology;  Laterality: N/A;  . TIBIA FRACTURE SURGERY Left 1980s    Assessment & Plan Clinical Impression: Patient is a 62 year old male with history of T2DM, CAD with ICM on chronic coumadin, prior CVA 12/2017, TURP 06/05/18 for BPH who was admitted on 06/14/18 with dizziness, RLE numbness, double vision and balance deficits. Patient had been off coumadin due to surgical procedure and INR 1.16 at admission. UDS negative. CTA head/neck showed hypoattenuation in right thalamus, right medial occipital and right superior cerebellum. MRI done revealing acute infarcts in R-PCA and R-SCA territories with punctate infract in L-PCA white matter felt to be embolic.  2 D echo showed EF 20% with diffuse hypokinesis and loosely organized thrombotic material at LV apex. He was started on IV heparin bridge till INR therapeutic. [name] felt to be embolic due to cardiac thrombus and heparin d/c once INR therapeutic. MBS done revealing functional swallow and he was placed on dysphagia 3, thins. He continues to be limited by balance deficits, left sided weakness with left hemianopsia and left inattention.   .  Patient transferred to CIR on 06/18/2018 .   Patient currently requires mod with mobility secondary to muscle weakness, unbalanced muscle activation, motor apraxia, ataxia, decreased coordination and decreased motor planning, field cut, decreased midline orientation, decreased attention to left and decreased motor planning, decreased attention, decreased awareness, decreased problem solving and delayed processing and decreased sitting balance, decreased standing balance, decreased postural control, hemiplegia and decreased balance strategies.  Prior to hospitalization, patient  was independent  with mobility and lived with   children in a House home.  Home access is 1Stairs to enter.  Patient will benefit from  skilled PT intervention to maximize safe functional mobility, minimize fall risk and decrease caregiver burden for planned discharge home with 24 hour supervision.  Anticipate patient will benefit from follow up [facility] at discharge.  PT - End of Session Activity Tolerance: Tolerates 10 - 20 min activity with multiple rests Endurance Deficit: Yes PT Assessment Rehab Potential (ACUTE/IP ONLY): Good PT Barriers to Discharge: Inaccessible home environment;Decreased caregiver support PT Patient demonstrates impairments in the following area(s): Balance;Behavior;Endurance;Motor;Nutrition;Perception;Safety;Sensory PT Transfers Functional Problem(s): Bed Mobility;Bed to Chair;Car;Furniture;Floor PT Locomotion Functional Problem(s): Ambulation;Wheelchair Mobility;Stairs PT Plan PT Intensity: Minimum of 1-2 x/day ,45 to 90 minutes PT Frequency: 5 out of 7 days PT Duration Estimated Length of Stay: 14-18 days  PT Treatment/Interventions: Ambulation/gait training;Balance/vestibular training;Discharge planning;Community reintegration;Cognitive remediation/compensation;Disease management/prevention;DME/adaptive equipment instruction;Functional electrical stimulation;Functional mobility training;Neuromuscular re-education;Patient/family education;Pain management;Psychosocial support;Skin care/wound management;Stair training;Splinting/orthotics;Therapeutic Activities;Therapeutic Exercise;UE/LE Coordination activities;UE/LE Strength taining/ROM;Visual/perceptual remediation/compensation;Wheelchair propulsion/positioning PT Transfers Anticipated Outcome(s): Supervision Assist with LRAD  PT Locomotion Anticipated Outcome(s): Ambulatory at Supervision assist with LRAD  PT Recommendation Recommendations for Other Services: Therapeutic Recreation consult Therapeutic Recreation Interventions: Stress management;Outing/community reintergration Follow Up Recommendations: Home health PT Patient destination: Home Equipment  Recommended: Rolling walker with 5" wheels  Skilled Therapeutic Intervention Pt received supine in bed and agreeable to PT. Supine>sit transfer with min  assist and min cues for safety. Sitting balacne EOB with min-supervision assist to prepare for OOB activity. PT instructed patient in PT Evaluation and initiated treatment intervention; see below for results. PT educated patient in [place], rehab potential, rehab goals, and discharge recommendations. Patient returned to room and left sitting in [facility] with call bell in reach and all needs met.      PT Evaluation Precautions/Restrictions Restrictions Weight Bearing Restrictions: No General   Vital SignsTherapy Vitals Temp: 98 F (36.7 C) Temp Source: Oral Pulse Rate: 70 Resp: 17 BP: 133/84 Patient Position (if appropriate): Lying Oxygen Therapy SpO2: 99 % O2 Device: Room Air Pain   0/10  Home Living/Prior Functioning Home Living Available Help at Discharge: Family;Available 24 hours/day Type of Home: House Home Access: Stairs to enter [employer] of Steps: 1 Home Layout: One level Bathroom Shower/Tub: Chiropodist: Handicapped height Bathroom Accessibility: Yes Prior Function Level of Independence: Independent with basic ADLs;Independent with homemaking with ambulation;Independent with homemaking with wheelchair  Able to Take Stairs?: Yes Driving: Yes 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  Vision/Perception  Vision - Assessment Ocular Range of Motion: Restricted on the left Alignment/Gaze Preference: Head turned;Gaze right Tracking/Visual Pursuits: Decreased smoothness of horizontal tracking;Decreased smoothness of eye movement to LEFT superior field;Decreased smoothness of eye movement to LEFT inferior field Saccades: Undershoots Diplopia Assessment: Disappears with one eye closed;Objects split side to side Perception Perception: Impaired Inattention/Neglect:  Does not attend to left side of body;Does not attend to left visual field Praxis Praxis: Impaired Praxis Impairment Details: Ideomotor;Perseveration  Cognition Overall Cognitive Status: Impaired/Different from baseline Orientation Level: Oriented to place;Oriented to person;Oriented to time;Oriented to situation Attention: Alternating;Selective Selective Attention: Impaired Alternating Attention: Impaired Awareness: Impaired Awareness Impairment: Emergent impairment;Anticipatory impairment Problem Solving: Impaired Safety/Judgment: Impaired Sensation Sensation Light Touch: Impaired Detail Central sensation comments: mild parathesia in the LUE and LLE with decreased appreciation to light touch in the L hip  Light Touch Impaired Details: Impaired LLE;Impaired LUE Proprioception: Impaired by gross assessment Additional Comments: decreased  Coordination Gross Motor Movements are Fluid and Coordinated: No Fine Motor Movements are Fluid and Coordinated: No Coordination and Movement Description: Ataxia inthe LLW Finger Nose Finger Test: Ataxia on teh L Heel Shin Test: dysmetric on the L  Motor  Motor Motor: Hemiplegia;Ataxia;Motor perseverations;Abnormal postural alignment and control Motor - Skilled Clinical Observations: significant ataxia in on the L UE>LE   Mobility Bed Mobility Bed Mobility: Rolling Right;Rolling Left;Supine to Sit;Sit to Supine Rolling Right: Minimal Assistance - Patient > 75% Rolling Left: Minimal Assistance - Patient > 75% Supine to Sit: Minimal Assistance - Patient > 75% Sit to Supine: Minimal Assistance - Patient > 75% Transfers Transfers: Sit to Stand;Stand to Sit;Stand Pivot Transfers Sit to Stand: Minimal Assistance - Patient > 75% Stand to Sit: Minimal Assistance - Patient > 75% Stand Pivot Transfers: Moderate Assistance - Patient 50 - 74% Stand Pivot Transfer Details: Verbal cues for precautions/safety;Verbal cues for technique;Verbal cues for safe  use of DME/AE;Verbal cues for gait pattern;Manual facilitation for weight shifting Transfer (Assistive device): Rolling walker Locomotion  Gait Ambulation: Yes Gait Assistance: 2 Helpers;Maximal Assistance - Patient 25-49%;Moderate Assistance - Patient 50-74% Gait Distance (Feet): 26 Feet Assistive device: Rolling walker Gait Gait: Yes Gait Pattern: Lateral trunk lean to left;Narrow base of support;Poor foot clearance - left;Left flexed knee in stance;Decreased weight shift to right;Step-to pattern Stairs / Additional Locomotion Stairs: Yes Stairs Assistance: Moderate Assistance - Patient 50 - 74% Stair Management Technique: Two rails Number of Stairs: 1 Wheelchair Mobility Wheelchair Mobility: Yes Wheelchair Assistance: Minimal assistance - Patient >75% Wheelchair Propulsion: Both upper extremities Wheelchair Parts Management: Needs assistance Distance: 14f  Trunk/Postural Assessment  Cervical Assessment Cervical Assessment: Exceptions to WFL(head turned to the L, Gaze R ) Thoracic Assessment Thoracic Assessment: Exceptions to WFL(lateral L left) Lumbar Assessment Lumbar Assessment: Exceptions to WFL(rigid ) Postural Control Postural Control: Deficits on evaluation Righting Reactions: delayed to the R and posteriorly  Protective Responses: delayed in standing.   Balance Balance Balance Assessed: Yes Standardized Balance Assessment Standardized Balance Assessment: Berg Balance Test;PASS Static Sitting Balance Static Sitting - Balance Support: Bilateral upper extremity supported Static Sitting - Level of Assistance: 5: Stand by assistance Dynamic Sitting Balance Dynamic Sitting - Balance Support: No upper extremity supported Dynamic Sitting - Level of Assistance: 4: Min aInsurance risk surveyorStanding - Balance Support: Bilateral upper extremity supported Static Standing - Level of Assistance: 3: Mod assist;4: Min assist Dynamic Standing Balance Dynamic  Standing - Balance Support: During functional activity;Bilateral upper extremity supported Dynamic Standing - Level of Assistance: 2: Max assist;3: Mod assist Dynamic Standing - Balance Activities: Lateral lean/weight shifting Extremity Assessment      RLE Assessment RLE Assessment: Within Functional Limits LLE Assessment LLE Assessment: Exceptions to [facility] Center[facility] Strength Comments: grosslt 4+/5 to 5/5 with delayed activation compared to the RLE   See Function Navigator for Current Functional Status.   Refer to Care Plan for Long Term Goals  Recommendations for other services: Therapeutic Recreation  Stress management and Outing/community reintegration  Discharge Criteria: Patient will be discharged from PT if patient refuses treatment 3 consecutive times without medical reason, if treatment goals not met, if there is a change in medical status, if patient makes no progress towards goals or if patient is discharged from hospital.  The above assessment, treatment plan, treatment alternatives and goals were discussed and mutually agreed upon: by patient  ALorie Phenix7/17/2019, 9:23 AM

## 2018-06-19 NOTE — Progress Notes (Signed)
Physical Medicine and Rehabilitation Consult Reason for Consult: Karl [name] Referring Physician: Difficulties with balance and equilibrium   HPI: MAITLAND LESIAK is a 62 y.o. male with a history of previous posterior circulation CVA who was admitted on 7/12 with dizziness and loss of balance.  MRI was performed ultimately which demonstrated infarcts in both the right PCA and right SCA territories as well as the left PCA territory.  Patient was seen by physical therapy yesterday and patient demonstrated difficulties with balance and equilibrium in addition to poor safety awareness.  Physical medicine rehab is consulted to assess patient for further rehab potential.   Review of Systems  Constitutional: Negative for fever.  HENT: Negative for hearing loss.   Eyes: Positive for double vision.  Respiratory: Negative for cough.   Cardiovascular: Negative for chest pain.  Gastrointestinal: Positive for nausea.  Musculoskeletal: Negative for myalgias.  Skin: Negative for rash.  Neurological: Positive for dizziness and focal weakness.  Psychiatric/Behavioral: Negative for depression.       Past Medical History:  Diagnosis Date  . Acute CVA (cerebrovascular accident) ([facility]) 06/14/2018  . Coronary artery disease    blockage   Stent Dr. Ladona Ridgel 15 years 1998  . Dyspnea   . Hypertension   . Stroke [facility]) 2019   denies residual on 06/14/2018  . Type II diabetes mellitus ([facility]) 07/2014 dx        Past Surgical History:  Procedure Laterality Date  . CORONARY ANGIOPLASTY WITH STENT PLACEMENT  1998  . FRACTURE SURGERY    . GREEN LIGHT LASER TURP (TRANSURETHRAL RESECTION OF PROSTATE N/A 06/05/2018   Procedure: GREEN LIGHT LASER TURP , TRANSURETHRAL RESECTION OF PROSTATE;  Surgeon: Crista Elliot, MD;  Location: [facility];  Service: Urology;  Laterality: N/A;  . TIBIA FRACTURE SURGERY Left 1980s        Family History  Problem Relation Age of Onset  . Diabetes Mother   . Diabetes Father    . Stroke Father 51  . Diabetes Sister   . Clotting disorder Sister 33  . Diabetes Other        nephew  . Diabetes Brother   . Diabetes Maternal Grandmother    Social History:  reports that he quit smoking about 17 years ago. His smoking use included cigarettes. He has a 10.00 pack-year smoking history. He has never used smokeless tobacco. He reports that he drinks alcohol. He reports that he has current or past drug history. Allergies:      Allergies  Allergen Reactions  . Lipitor [Atorvastatin] Nausea And Vomiting         Medications Prior to Admission  Medication Sig Dispense Refill  . carvedilol (COREG) 12.5 MG tablet TAKE 1 & 1/2 (ONE & ONE-HALF) TABLETS BY MOUTH TWICE DAILY 270 tablet 2  . cephALEXin (KEFLEX) 500 MG capsule Take 1 capsule (500 mg total) by mouth 3 (three) times daily for 5 days. 15 capsule 0  . dapagliflozin propanediol (FARXIGA) 5 MG TABS tablet Take 5 mg by mouth daily. 30 tablet 5  . enalapril (VASOTEC) 5 MG tablet TAKE 1 TABLET BY MOUTH TWICE DAILY (Patient taking differently: TAKE 1 TABLET (5mg ) BY MOUTH TWICE DAILY) 60 tablet 11  . finasteride (PROSCAR) 5 MG tablet Take 5 mg by mouth daily.    Marland Kitchen HYDROcodone-acetaminophen (NORCO/VICODIN) 5-325 MG tablet Take 1 tablet by mouth every 4 (four) hours as needed for moderate pain. 12 tablet 0  . metFORMIN (GLUCOPHAGE) 1000 MG tablet TAKE 1 TABLET BY  MOUTH TWICE DAILY WITH A MEAL (Patient taking differently: TAKE 1 TABLET (1000mg ) BY MOUTH TWICE DAILY WITH A MEAL) 90 tablet 1  . ondansetron (ZOFRAN ODT) 4 MG disintegrating tablet 4mg  ODT q4 hours prn nausea/vomit (Patient taking differently: Take 4 mg by mouth every 4 (four) hours as needed for nausea or vomiting. 4mg  ODT q4 hours prn nausea/vomit) 10 tablet 0  . tamsulosin (FLOMAX) 0.4 MG CAPS capsule Take 1 capsule (0.4 mg total) by mouth daily after supper. 90 capsule 1  . warfarin (COUMADIN) 10 MG tablet Take 10 mg by mouth daily.       Home: Home Living Family/patient expects to be discharged to:: Private residence Living Arrangements: Children, Other (Comment) Available Help at Discharge: Family, Available 24 hours/day Type of Home: House Home Access: Stairs to enter [employer] of Steps: 1 Home Layout: One level Bathroom Shower/Tub: Engineer, manufacturing systems: Handicapped height Home Equipment: None  Functional History: Prior Function Level of Independence: Independent Comments: works packing climbing ropes  Functional Status:  Mobility: Bed Mobility Overal bed mobility: Needs Assistance Bed Mobility: Sit to Supine Supine to sit: Min assist Sit to supine: Min assist General bed mobility comments: assist getting LLE over EOB, cues for usiing L leg with bed mobility to push up to higher position in bed, pt able to follow cues Transfers Overall transfer level: Needs assistance Equipment used: Rolling walker (2 wheeled) Transfers: Sit to/from Stand, [employer] Transfers Sit to Stand: Mod assist Squat pivot transfers: Mod assist General transfer comment: pt standing EOB with mod A, L and posterior lean noted. able to correct when cued, but not for ong.  Ambulation/Gait General Gait Details: defered 2/2 safety   ADL: ADL Overall ADL's : Needs assistance/impaired Eating/Feeding: Set up, Sitting, Bed level Grooming: Wash/dry hands, Wash/dry face, Oral care, Brushing hair, Minimal assistance, Sitting Upper Body Bathing: Moderate assistance, Sitting Lower Body Bathing: Maximal assistance, Sit to/from stand Upper Body Dressing : Moderate assistance, Sitting Lower Body Dressing: Maximal assistance, Sit to/from stand Toilet Transfer: Minimal assistance, Squat-pivot, BSC Toileting- Clothing Manipulation and Hygiene: Maximal assistance, Sit to/from stand Functional mobility during ADLs: Minimal assistance, Moderate assistance, Rolling walker General ADL Comments: Pt fatigues quickly with  activity.  Keeps eyes closed much of the time   Cognition: Cognition Overall Cognitive Status: Impaired/Different from baseline Orientation Level: Oriented X4 Cognition Arousal/Alertness: Awake/alert Behavior During Therapy: Flat affect Overall Cognitive Status: Impaired/Different from baseline Area of Impairment: Attention, Following commands, Safety/judgement, Awareness, Problem solving Current Attention Level: Sustained Following Commands: Follows one step commands consistently Safety/Judgement: Decreased awareness of safety, Decreased awareness of deficits Awareness: Intellectual Problem Solving: Slow processing, Decreased initiation, Difficulty sequencing, Requires verbal cues, Requires tactile cues General Comments: mild improvement from OT session more alert and following commands  Blood pressure 127/87, pulse 95, temperature 98.1 F (36.7 C), temperature source Oral, resp. rate (!) 22, height 6\' 2"  (1.88 m), weight 105.2 kg (232 lb), SpO2 98 %. Physical Exam  Constitutional: He is oriented to person, place, and time. He appears well-developed.  HENT:  Head: Normocephalic.  Eyes: Pupils are equal, round, and reactive to light.  Neck: Normal range of motion.  Cardiovascular: Normal rate and regular rhythm.  Respiratory: Effort normal. No respiratory distress.  GI: Soft. He exhibits no distension.  Musculoskeletal: Normal range of motion. He exhibits no edema.  Neurological: He is alert and oriented to person, place, and time. No cranial nerve deficit.  No diplopia, speech clear, voice slightly dysphonic, RUE 5/5. LUE  4- to 4/5 prox to distal. RLE 5/6, LLE 4+/5. No focal sensory deficits. Intact FTN. Cognitively appropriate  Skin: Skin is warm.  Psychiatric: He has a normal mood and affect. His behavior is normal.   Assessment/Plan: Diagnosis: bilateral posterior circulation infarcts with subsequent balance and gait deficits 1. Does the need for close, 24 hr/day medical  supervision in concert with the patient's rehab needs make it unreasonable for this patient to be served in a less intensive setting? Yes 2. Co-Morbidities requiring supervision/potential complications: CAD, HTN, previous CVA 3. Due to bladder management, bowel management, safety, skin/wound care, disease management, medication administration, pain management and patient education, does the patient require 24 hr/day rehab nursing? Yes 4. Does the patient require coordinated care of a physician, rehab nurse, PT (1-2 hrs/day, 5 days/week) and OT (1-2 hrs/day, 5 days/week) to address physical and functional deficits in the context of the above medical diagnosis(es)? Yes Addressing deficits in the following areas: balance, endurance, locomotion, strength, transferring, bowel/bladder control, bathing, dressing, feeding, grooming, toileting and psychosocial support 5. Can the patient actively participate in an intensive therapy program of at least 3 hrs of therapy per day at least 5 days per week? Yes 6. The potential for patient to make measurable gains while on inpatient rehab is excellent 7. Anticipated functional outcomes upon discharge from inpatient rehab are modified independent  with PT, modified independent with OT, n/a with SLP. 8. Estimated rehab length of stay to reach the above functional goals is: 8-13 days 9. Anticipated D/C setting: Home 10. Anticipated post D/C treatments: HH therapy and Outpatient therapy 11. Overall Rehab/Functional Prognosis: excellent  RECOMMENDATIONS: This patient's condition is appropriate for continued rehabilitative care in the following setting: CIR Patient has agreed to participate in recommended program. Yes Note that insurance prior authorization may be required for reimbursement for recommended care.  Comment: Rehab Admissions Coordinator to follow up.  Thanks,  Ranelle Oyster, MD, [place]  I have personally performed a face to face diagnostic  evaluation of this patient. Additionally, I have reviewed and concur with the physician assistant's documentation above.    Ranelle Oyster, MD 06/16/2018           Routing History

## 2018-06-19 NOTE — Progress Notes (Signed)
No breakfast tray at this time.  Pt to do to therapy and will recheck sugar when return and hopefully breakfast tray will be here. Will continue to monitor.

## 2018-06-19 NOTE — Progress Notes (Addendum)
[facility]     PROGRESS NOTE    Subjective/Complaints: Had a reasonable night. Throat a bit sore. Ready for therapies today. Foley just removed  ROS: Patient denies fever, rash, sore throat, blurred vision, nausea, vomiting, diarrhea, cough, shortness of breath or chest pain, joint or back pain, headache, or mood change.   Objective:  No results found. Recent Labs    06/18/18 0402 06/19/18 0623  WBC 10.0 8.4  HGB 13.4 13.8  HCT 41.9 43.1  PLT 373 370   Recent Labs    06/18/18 0402 06/19/18 0623  NA 135 132*  K 3.5 4.0  CL 97* 96*  GLUCOSE 156* 160*  BUN 16 19  CREATININE 1.13 1.27*  CALCIUM 9.1 9.0   CBG (last 3)  Recent Labs    06/18/18 1635 06/18/18 2121 06/19/18 0640  GLUCAP 160* 192* 144*    Wt Readings from Last 3 Encounters:  06/18/18 90.6 kg (199 lb 11.8 oz)  06/14/18 105.2 kg (232 lb)  06/11/18 105.2 kg (232 lb)     Intake/Output Summary (Last 24 hours) at 06/19/2018 0726 Last data filed at 06/19/2018 9450 Gross per 24 hour  Intake 200 ml  Output 650 ml  Net -450 ml    Vital Signs: Blood pressure 133/84, pulse 70, temperature 98 F (36.7 C), temperature source Oral, resp. rate 17, height 6\' 2"  (1.88 m), weight 90.6 kg (199 lb 11.8 oz), SpO2 99 %. Physical Exam:  Constitutional: No distress . Vital signs reviewed. HEENT: EOMI, oral membranes moist, mild thrush Neck: supple Cardiovascular: RRR without murmur. No JVD    Respiratory: CTA Bilaterally without wheezes or rales. Normal effort    GI: BS +, non-tender, non-distended Foley out  Musculoskeletal: Normal range of motion.  Neurological:  Left central 7. Speech intelligible, sl dysarthria. Motor 5/5 RUE and RLE. LUE 4/5 prox to distal. LLE 4+/5. No focal sensory changes. Normal insight and awareness Psych: pleasant   Assessment/Plan: 1. Functional and mobility deficits secondary to bilateral posterior circulation infarcts which require 3+ hours per  day of interdisciplinary therapy in a comprehensive inpatient rehab setting. Physiatrist is providing close team supervision and 24 hour management of active medical problems listed below. Physiatrist and rehab team continue to assess barriers to discharge/monitor patient progress toward functional and medical goals.  Function:  Bathing Bathing position Bathing activity did not occur: N/A    Bathing parts      Bathing assist        Upper Body Dressing/Undressing Upper body dressing   What is the patient wearing?: Hospital gown                Upper body assist Assist Level: 2 helpers      Lower Body Dressing/Undressing Lower body dressing Lower body dressing/undressing activity did not occur: N/A                                Lower body assist Assist for lower body dressing: 2 Helpers      Toileting Toileting          Toileting assist     Transfers Chair/bed Optician, dispensing          Cognition Comprehension    Expression    Social Interaction  Problem Solving    Memory     Medical Problem List and Plan:  1. Functional deficits secondary to bilateral posterior circulation, embolic infarcts  -beginning therapies today 2. DVT Prophylaxis/Anticoagulation: Pharmaceutical: Coumadin INR 1.9 3. Pain Management: tylenol prn  4. Mood: LCSW to follow for evaluation and support.  5. Neuropsych: This patient is capable of making decisions on his own behalf.  6. Skin/Wound Care: routine pressure relief measures.  7. Fluids/Electrolytes/Nutrition: Monitor I/O.  Marland KitchenI personally reviewed the patient's labs today.    -continue to encourage Fluids.  8. CAD with ICM: Coumadin with INR goal 2-3.Marland Kitchen Continue coreg and Crestor.  9.T2DM: Resumed metformin at 500 mg bid and titrate towards home dose.  -poor control, watch for trend with metformin on board 10. Hypokalemia: Improved. Will continue supplement  for a couple more days.  11. HTN: Monitor BP bid--on coreg bid. Off Vasotec at this time to allow for adequate perfusion, permissive hypertension   -reasonable control 7/17 12. TURP/BPH: ON Proscar and Flomax.   -foley removed this morning. Voiding trial  -keflex dc'ed   LOS (Days) 1 A FACE TO FACE EVALUATION WAS PERFORMED  Ranelle Oyster, MD 06/19/2018 7:26 AM

## 2018-06-19 NOTE — Progress Notes (Signed)
ANTICOAGULATION CONSULT NOTE - Follow Up Consult  Pharmacy Consult for warfarin Indication: stroke  Allergies  Allergen Reactions  . Lipitor [Atorvastatin] Nausea And Vomiting    Patient Measurements: Height: 6\' 2"  (188 cm) Weight: 199 lb 11.8 oz (90.6 kg)(Rn notified) IBW/kg (Calculated) : 82.2  Vital Signs: Temp: 98 F (36.7 C) (07/17 0551) Temp Source: Oral (07/17 0551) BP: 133/84 (07/17 0551) Pulse Rate: 70 (07/17 0551)  Labs: Recent Labs    06/16/18 1405  06/17/18 0718 06/18/18 0402 06/19/18 0623  HGB  --    < > 14.2 13.4 13.8  HCT  --   --  45.1 41.9 43.1  PLT  --   --  392 373 370  LABPROT  --   --  27.5* 24.4* 22.1*  INR  --   --  2.58 2.22 1.95  HEPARINUNFRC 0.68  --  0.50  --   --   CREATININE  --   --  1.08 1.13 1.27*   < > = values in this interval not displayed.    Estimated Creatinine Clearance: 69.2 mL/min (A) (by C-G formula based on SCr of 1.27 mg/dL (H)).  Assessment: 58 yoM on warfarin PTA for hx CVA- held on admission for a scheduled procedure, per report patient restart post-procedure, but INR was subtherapeutic on admission. Pt presented to [facility] ED with RLE numbness, dizziness, and speech deficits and noted to have left chronic cerebellar cva and acute occipital lobe CVA.  INR slightly subtherapeutic at 1.95 today.  Continue Warfarin 10 mg daily.   Goal of Therapy:  INR 2-3  Monitor platelets by anticoagulation protocol: Yes   Plan:  Warfarin 10mg  po daily as per home dose Daily INR for now Follow for s/s bleeding  Robyne Askew Clinical Pharmacist [phone] Please check AMION for all [facility] Pharmacy numbers 06/19/2018 8:42 AM

## 2018-06-19 NOTE — Progress Notes (Signed)
PMR Admission Coordinator Pre-Admission Assessment  Patient: Karl [name] is an 62 y.o., male MRN: [id number] DOB: 1956-05-24 Height: 6\' 2"  (188 cm) Weight: 105.2 kg (232 lb)                                                                                                                                                  Insurance Information HMO:     PPO: X     PCP:      IPA:      80/20:      OTHER:  PRIMARY: [employer]      Policy#: [id number]      Subscriber: Self CM Name: Frederic Jericho       Phone#: [phone] [id number]     Fax#: [phone] Pre-Cert#: [id number] for 06/18/18-06/24/18 given by Dartha Lodge (Phone#: [phone] [phone]     Fax#: [phone]) on 06/18/18 follow ups due to Frederic Jericho       Employer: Full Time Benefits:  Phone #: [phone]     Name: Verified via automated system  Eff. Date: 02/02/16-02/01/19     Deduct: $3500      Out of Pocket Max: $6000      Life Max: N/A CIR: 100%      SNF: 100% Outpatient: 100%     Co-Pay: $0 Home Health: 100%      Co-Pay: $0 DME: 100%     Co-Pay: $0 Providers: In-network   SECONDARY: None        Emergency Contact Information         Contact Information    Name Relation Home Work Mobile   Taliercio,Dianne Spouse   [phone]   Lowe,[name] Daughter [phone]  [phone]   [name] Daughter   [phone]   [name] Grandaughter   [phone]     Current Medical History  Patient Admitting Diagnosis: bilateral posterior circulation infarcts with subsequent balance and gait deficits  History of Present Illness: Karl [name] is a 62 year old male with history of T2DM, CAD with ICM on chronic coumadin, prior CVA 12/2017, TURP 06/05/18 for BPH who was admitted on 06/14/18 with dizziness, RLE numbness, double vision and balance deficits. Patient had been off coumadin due to surgical procedure and INR 1.16 at admission. UDS negative. CTA head/neck showed hypoattenuation in right thalamus, right medial  occipital and right superior cerebellum. MRI done revealing acute infarcts in R-PCA and R-SCA territories with punctate infract in L-PCA white matter felt to be embolic.   2 D echo showed EF 20% with diffuse hypokinesis and loosely organized thrombotic material at LV apex. He was started on IV heparin bridge till INR therapeutic. Dr. Roda Shutters felt to be embolic due to cardiac thrombus and heparin d/c once INR therapeutic. MBS done revealing functional swallow and he was placed on dysphagia 3, thins. He continues to be limited by balance deficits,  left sided weakness with left hemianopsia and left inattention. CIR recommended due to functional decline and patient admitted 06/18/18.   NIH Total: 3  Past Medical History      Past Medical History:  Diagnosis Date  . Acute CVA (cerebrovascular accident) (HCC) 06/14/2018  . Coronary artery disease    blockage   Stent Dr. Ladona Ridgel 15 years 1998  . Dyspnea   . Hypertension   . Stroke [facility] 2019   denies residual on 06/14/2018  . Type II diabetes mellitus (HCC) 07/2014 dx    Family History  family history includes Clotting disorder (age of onset: 28) in his sister; Diabetes in his brother, father, maternal grandmother, mother, other, and sister; Stroke (age of onset: 60) in his father.  Prior Rehab/Hospitalizations:  Has the patient had major surgery during 100 days prior to admission? Yes  Current Medications   Current Facility-Administered Medications:  .  acetaminophen (TYLENOL) tablet 650 mg, 650 mg, Oral, Q4H PRN, 650 mg at 06/15/18 1025 **OR** acetaminophen (TYLENOL) solution 650 mg, 650 mg, Per Tube, Q4H PRN **OR** acetaminophen (TYLENOL) suppository 650 mg, 650 mg, Rectal, Q4H PRN, Mikhail, Maryann, DO .  carvedilol (COREG) tablet 12.5 mg, 12.5 mg, Oral, BID WC, Osvaldo Shipper, MD, 12.5 mg at 06/18/18 0858 .  cephALEXin (KEFLEX) capsule 500 mg, 500 mg, Oral, TID, Osvaldo Shipper, MD, 500 mg at 06/18/18 0858 .  feeding supplement  (ENSURE ENLIVE) (ENSURE ENLIVE) liquid 237 mL, 237 mL, Oral, BID BM, Mikhail, Maryann, DO, 237 mL at 06/17/18 1532 .  finasteride (PROSCAR) tablet 5 mg, 5 mg, Oral, Daily, Mikhail, [place], DO, 5 mg at 06/18/18 0858 .  HYDROcodone-acetaminophen (NORCO/VICODIN) 5-325 MG per tablet 1 tablet, 1 tablet, Oral, Q4H PRN, Mikhail, Maryann, DO .  insulin aspart (novoLOG) injection 0-9 Units, 0-9 Units, Subcutaneous, TID WC, Mikhail, [place], DO, 3 Units at 06/18/18 1147 .  ondansetron (ZOFRAN-ODT) disintegrating tablet 4 mg, 4 mg, Oral, Q4H PRN, Edsel Petrin, DO, 4 mg at 06/15/18 1025 .  polyethylene glycol (MIRALAX / GLYCOLAX) packet 17 g, 17 g, Oral, Daily, Osvaldo Shipper, MD, 17 g at 06/18/18 0856 .  promethazine (PHENERGAN) injection 12.5 mg, 12.5 mg, Intravenous, Q6H PRN, Osvaldo Shipper, MD .  rosuvastatin (CRESTOR) tablet 40 mg, 40 mg, Oral, q1800, Marvel Plan, MD, 40 mg at 06/17/18 1743 .  senna-docusate (Senokot-S) tablet 1 tablet, 1 tablet, Oral, QHS PRN, Edsel Petrin, DO, 1 tablet at 06/15/18 2242 .  sodium phosphate (FLEET) 7-19 GM/118ML enema 1 enema, 1 enema, Rectal, Daily PRN, Osvaldo Shipper, MD, 1 enema at 06/17/18 1831 .  tamsulosin (FLOMAX) capsule 0.4 mg, 0.4 mg, Oral, QPC supper, Edsel Petrin, DO, 0.4 mg at 06/17/18 1743 .  warfarin (COUMADIN) tablet 10 mg, 10 mg, Oral, q1800, Bajbus, Lauren D, RPH .  Warfarin - Pharmacist Dosing Inpatient, , Does not apply, q1800, Cindi Carbon, RPH  Patients Current Diet:       Diet Order           DIET DYS 3 Room service appropriate? Yes; Fluid consistency: Thin  Diet effective now          Precautions / Restrictions Precautions Precautions: Fall Precaution Comments: poor proprioceptive awareness of Lt  Restrictions Weight Bearing Restrictions: No   Has the patient had 2 or more falls or a fall with injury in the past year?No  Prior Activity Level Community (5-7x/wk): Prior to admission patient was fully  independent, active, and working.  He enjoys working on cars and in the  yard and has a supportive family available to assist as needed.  Home Assistive Devices / Equipment Home Assistive Devices/Equipment: Eyeglasses, CBG Meter Home Equipment: None  Prior Device Use: Indicate devices/aids used by the patient prior to current illness, exacerbation or injury? None of the above  Prior Functional Level Prior Function Level of Independence: Independent Comments: works packing climbing ropes   Self Care: Did the patient need help bathing, dressing, using the toilet or eating? Independent  Indoor Mobility: Did the patient need assistance with walking from room to room (with or without device)? Independent  Stairs: Did the patient need assistance with internal or external stairs (with or without device)? Independent  Functional Cognition: Did the patient need help planning regular tasks such as shopping or remembering to take medications? Independent  Current Functional Level Cognition  Arousal/Alertness: Awake/alert Overall Cognitive Status: Impaired/Different from baseline Current Attention Level: Selective Orientation Level: Oriented X4 Following Commands: Follows one step commands with increased time Safety/Judgement: Decreased awareness of safety, Decreased awareness of deficits General Comments: Pt states "where did that sock go? I just had it" pt losing sock on L thigh applying socks and unable to problem solve where it could be . pt needed cues to scan to locate sock on L thigh. Also noted decr sensation to not feel the sock touching his bare skin Attention: Selective Selective Attention: Impaired Selective Attention Impairment: Verbal complex, Functional complex Memory: Impaired Memory Impairment: Decreased recall of new information, Retrieval deficit, Decreased short term memory Decreased Short Term Memory: Verbal complex, Functional complex Awareness:  Impaired Awareness Impairment: Emergent impairment, Anticipatory impairment Problem Solving: Impaired Problem Solving Impairment: Verbal complex, Functional complex Executive Function: Reasoning Reasoning: Impaired Reasoning Impairment: Verbal basic, Functional complex Safety/Judgment: Appears intact    Extremity Assessment (includes Sensation/Coordination)  Upper Extremity Assessment: LUE deficits/detail LUE Deficits / Details: Decr fine motor. pt reports "it is delayed" describing the use of L UE with R UE to complete adls. pt noted to have AROM but decr proprioception LUE Sensation: decreased proprioception LUE Coordination: decreased fine motor, decreased gross motor  Lower Extremity Assessment: Defer to PT evaluation LLE Sensation: decreased proprioception LLE Coordination: decreased gross motor    ADLs  Overall ADL's : Needs assistance/impaired Eating/Feeding: Set up, Sitting Eating/Feeding Details (indicate cue type and reason): pt noted to have ice cream on far L corner of tray and unaware of its presence.  Grooming: Wash/dry hands, Wash/dry face, Oral care, Brushing hair, Minimal assistance, Sitting Upper Body Bathing: Moderate assistance, Sitting Lower Body Bathing: Maximal assistance, Sit to/from stand Upper Body Dressing : Moderate assistance, Sitting Lower Body Dressing: Moderate assistance, Bed level Lower Body Dressing Details (indicate cue type and reason): pt donning socks with knee flexion. pt falling to the L during attempt. pt leaning on R rail and states "let me lay this way to see if i can get this sock on" pt with decr trunk control with LB dressing Toilet Transfer: Moderate assistance, Tax adviser Details (indicate cue type and reason): pt with incr L lean and lack of awareness to L lean during simulated transfer to the chair Toileting- Clothing Manipulation and Hygiene: Maximal assistance, Sit to/from stand Functional mobility during ADLs:  Minimal assistance, Moderate assistance, Rolling walker General ADL Comments: Pt requires L side guarded for all transfers. pt with L lean at 6\' 2"  and lacks awareness for self correction at this time    Mobility  Overal bed mobility: Needs Assistance Bed Mobility: Supine to Sit Supine to sit: Min  guard Sit to supine: Min assist General bed mobility comments: min guard for safety; increased time and effort    Transfers  Overall transfer level: Needs assistance Equipment used: Rolling walker (2 wheeled) Transfers: Sit to/from Stand Sit to Stand: Mod assist Squat pivot transfers: Mod assist General transfer comment: cues for safe hand placement and assistance required for balance      Ambulation / Gait / Stairs / Wheelchair Mobility  Ambulation/Gait Ambulation/Gait assistance: Max assist, +2 safety/equipment Gait Distance (Feet): 8 Feet Assistive device: Rolling walker (2 wheeled) Gait Pattern/deviations: Step-through pattern, Decreased step length - right, Ataxic(atatxic L LE ) General Gait Details: cues required for sequencing, L LE placement, BOS, and finding midline posture; assistance required for balance, managing RW, and weight shifting     Posture / Balance Dynamic Sitting Balance Sitting balance - Comments: Lt lateral lean.  requires min A intermittently to correct  Balance Overall balance assessment: Needs assistance Sitting-balance support: Bilateral upper extremity supported Sitting balance-Leahy Scale: Fair Sitting balance - Comments: Lt lateral lean.  requires min A intermittently to correct  Postural control: Left lateral lean Standing balance support: Bilateral upper extremity supported Standing balance-Leahy Scale: Poor Standing balance comment: requires mod A     Special needs/care consideration BiPAP/CPAP: No CPM: No Continuous Drip IV: No Dialysis: No         Life Vest: No Oxygen: No Special Bed: No Trach Size: No Wound Vac (area): No        Skin: WDL                               Bowel mgmt: Per RN, Eugenie Norrie report enema administered 7/15 which resulted in small BM  Bladder mgmt: Foley placed 06/05/18 due to bladder outlet obstruction  Diabetic mgmt: Yes, prior to admission patient checked CBG daily and took oral medication for management      Previous Home Environment Living Arrangements: Children, Other (Comment)  Lives With: Daughter(and grandson) Available Help at Discharge: Family, Available 24 hours/day Type of Home: House Home Layout: One level Home Access: Stairs to enter Secretary/administrator of Steps: 1 Bathroom Shower/Tub: Engineer, manufacturing systems: Handicapped height Home Care Services: No  Discharge Living Setting Plans for Discharge Living Setting: Patient's home, House, Lives with (comment)(spouse and daughter) Type of Home at Discharge: House Discharge Home Layout: One level Discharge Home Access: Stairs to enter Entrance Stairs-Rails: None Entrance Stairs-Number of Steps: 1 Discharge Bathroom Shower/Tub: Tub/shower unit, Curtain Discharge Bathroom Toilet: Handicapped height Discharge Bathroom Accessibility: Yes How Accessible: Accessible via walker Does the patient have any problems obtaining your medications?: No  Social/Family/Support Systems Patient Roles: Spouse, Parent, Other (Comment)(Grandparent ) Contact Information: Spouse: Hamilton Capri, Daughter: [name] Anticipated Caregiver: Family  Anticipated Caregiver's Contact Information: see above  Ability/Limitations of Caregiver: None between family members someone is always there  Caregiver Availability: 24/7 Discharge Plan Discussed with Primary Caregiver: Yes Is Caregiver In Agreement with Plan?: Yes Does Caregiver/Family have Issues with Lodging/Transportation while Pt is in Rehab?: No  Goals/Additional Needs Patient/Family Goal for Rehab: PT/OT/SLP: Mod I  Expected length of stay: 8-13 days  Cultural Considerations:  None Dietary Needs: Dys.3 textures and thin liquids  Equipment Needs: TBD Special Service Needs: None Pt/Family Agrees to Admission and willing to participate: Yes Program Orientation Provided & Reviewed with Pt/Caregiver Including Roles  & Responsibilities: Yes  Decrease burden of Care through IP rehab admission: No  Possible need for  SNF placement upon discharge: Not anticipated  Patient Condition: This patient's medical and functional status has changed since the consult dated: 06/16/18 at 9:15 am in which the Rehabilitation Physician determined and documented that the patient's condition is appropriate for intensive rehabilitative care in an inpatient rehabilitation facility. See "History of Present Illness" (above) for medical update. Functional changes are: Max A +2 8 feet. Patient's medical and functional status update has been discussed with the Rehabilitation physician and patient remains appropriate for inpatient rehabilitation. Will admit to inpatient rehab today.  Preadmission Screen Completed By:  Fae Pippin, 06/18/2018 2:39 PM ______________________________________________________________________   Discussed status with Dr. Riley Kill on 06/18/18 at 1455 and received telephone approval for admission today.  Admission Coordinator:  Fae Pippin, time 1455/Date 06/18/18             Cosigned by: Ranelle Oyster, MD at 06/18/2018 3:10 PM  Revision History

## 2018-06-19 NOTE — Evaluation (Signed)
Occupational Therapy Assessment and Plan  Patient Details  Name: Karl [name] MRN: [id number] Date of Birth: Apr 24, 1956  OT Diagnosis: ataxia, hemiplegia affecting non-dominant side and muscle weakness (generalized) Rehab Potential: Rehab Potential (ACUTE ONLY): Excellent ELOS: 14-17 days   Today's Date: 06/19/2018 OT Individual Time: 1030-1130 OT Individual Time Calculation (min): 60 min     Problem List:  Patient Active Problem List   Diagnosis Date Noted  . Acute ischemic right PCA stroke ([place]) 06/18/2018  . Ischemic cardiomyopathy   . CVA (cerebral vascular accident) ([place]) 06/14/2018  . Urinary retention 06/05/2018  . Encounter for therapeutic drug monitoring 02/08/2018  . History of CVA (cerebrovascular accident) 12/20/2017  . Coronary artery disease 12/19/2017  . Mild renal insufficiency 12/19/2017  . TIA (transient ischemic attack) 12/19/2017  . Hip flexor tendinitis 05/22/2017  . Abscess of back 05/22/2017  . Pain of both hip joints 04/25/2017  . Dyslipidemia 09/01/2015  . BPH without urinary obstruction 09/01/2015  . DM (diabetes mellitus), type 2, uncontrolled, periph vascular complic ([place]) [id number]/[id number]  . Essential hypertension 04/20/2009  . CARDIOMYOPATHY, ISCHEMIC 04/20/2009  . LEFT BUNDLE BRANCH BLOCK 04/20/2009  . Chronic systolic heart failure ([place]) 04/20/2009    Past Medical History:  Past Medical History:  Diagnosis Date  . Acute CVA (cerebrovascular accident) ([place]) 06/14/2018  . Coronary artery disease    blockage   Stent Dr. Lovena Le 15 years 1998  . Dyspnea   . Hypertension   . Stroke [facility] 2019   denies residual on 06/14/2018  . Type II diabetes mellitus ([place]) 07/2014 dx   Past Surgical History:  Past Surgical History:  Procedure Laterality Date  . CORONARY ANGIOPLASTY WITH STENT PLACEMENT  1998  . FRACTURE SURGERY    . GREEN LIGHT LASER TURP (TRANSURETHRAL RESECTION OF PROSTATE N/A 06/05/2018   Procedure: GREEN LIGHT LASER TURP , TRANSURETHRAL  RESECTION OF PROSTATE;  Surgeon: [name], MD;  Location: [facility] ORS;  Service: Urology;  Laterality: N/A;  . TIBIA FRACTURE SURGERY Left 1980s    Assessment & Plan Clinical Impression: Patient is a 62 y.o. year old male with recent admission to the hospital on  62 year old male with history of T2DM, CAD with ICM on chronic coumadin, prior CVA 12/2017, TURP 06/05/18 for BPH who was admitted on 06/14/18 with dizziness, RLE numbness, double vision and balance deficits. CTA head/neck showed hypoattenuation in right thalamus, right medial occipital and right superior cerebellum. MRI done revealing acute infarcts in R-PCA and R-SCA territories with punctate infract in L-PCA white matter felt to be embolic. Patient transferred to CIR on 06/18/2018 .    Patient currently requires mod with basic self-care skills secondary to muscle weakness, impaired timing and sequencing, unbalanced muscle activation, ataxia, decreased coordination and decreased motor planning, decreased midline orientation, decreased attention to left and left side neglect, decreased initiation, decreased attention, decreased awareness, decreased problem solving, decreased safety awareness, decreased memory and delayed processing and decreased sitting balance, decreased standing balance, decreased postural control, hemiplegia and decreased balance strategies.  Prior to hospitalization, patient could complete BALD with independent .  Patient will benefit from skilled intervention to increase independence with basic self-care skills prior to discharge home with care partner.  Anticipate patient will require 24 hour supervision and follow up home health.  OT - End of Session Endurance Deficit: Yes Endurance Deficit Description: Multiple rest breaks within BADL tasks OT Assessment Rehab Potential (ACUTE ONLY): Excellent OT Patient demonstrates impairments in the following area(s):  Balance;Cognition;Endurance;Motor;Perception;Safety;Sensory  OT Basic ADL's Functional Problem(s): Eating;Grooming;Dressing;Toileting;Bathing OT Transfers Functional Problem(s): Toilet;Tub/Shower OT Additional Impairment(s): Fuctional Use of Upper Extremity OT Plan OT Intensity: Minimum of 1-2 x/day, 45 to 90 minutes OT Frequency: 5 out of 7 days OT Duration/Estimated Length of Stay: 14-17 days OT Treatment/Interventions: Balance/vestibular training;Cognitive remediation/compensation;Community reintegration;Discharge planning;DME/adaptive equipment instruction;Functional mobility training;Neuromuscular re-education;Patient/family education;Self Care/advanced ADL retraining;Therapeutic Activities;Therapeutic Exercise;UE/LE Strength taining/ROM;UE/LE Coordination activities;Visual/perceptual remediation/compensation;Wheelchair propulsion/positioning OT Self Feeding Anticipated Outcome(s): Mod I OT Basic Self-Care Anticipated Outcome(s): Supervision OT Toileting Anticipated Outcome(s): Supervision OT Bathroom Transfers Anticipated Outcome(s): Supervision OT Recommendation Patient destination: Home Follow Up Recommendations: Home health OT Equipment Recommended: To be determined   Skilled Therapeutic Intervention Pt greeted sitting on commode with nursing staff present. Sit<>stand win Stedy with min/mod A and verbal cues for L body awareness and L lateral lean. Nurse tech assisted with peri-care s/p BM, then OT transferred pt into shower via Stedy. Bathing completed with focus on forced use of L UE throughout. Pt needed mod verbal cues to integrate L UE into bathing tasks and had difficulty coordinating grasp on wash cloth. Dressing completed wc at the sink with verbal cues for sequencing. Sit<>stand with Mod A at the sink and facilitation for full upright position. Discussed rehab process, OT purpose, POC,and goals with pt. Pt left seated in wc with safety alarm belt on and needs  met.  Evaluation Precautions/Restrictions  Precautions Precautions: Fall Precaution Comments: poor proprioceptive awareness of Lt  Restrictions Weight Bearing Restrictions: No Pain   none/denies pain Home Living/Prior Functioning Home Living Family/patient expects to be discharged to:: Private residence Living Arrangements: Children, Other relatives, Spouse/significant other Available Help at Discharge: Family, Available 24 hours/day Type of Home: House Home Access: Stairs to enter [employer] of Steps: 1 Home Layout: One level Bathroom Shower/Tub: Chiropodist: Handicapped height Bathroom Accessibility: Yes Additional Comments: no shower equipment available  Lives With: Daughter(and adult grandsons 29 and 5) IADL History Current License: Yes Occupation: Full time employment Type of Occupation: 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 [place] Function Level of Independence: Independent with basic ADLs, Independent with homemaking with ambulation  Able to Take Stairs?: Yes Driving: Yes 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  ADL ADL ADL Comments: Please see functional navigator Vision Baseline Vision/History: Wears glasses Wears Glasses: Distance only;Reading only Patient Visual Report: Diplopia;Blurring of vision(Pt reports episodes of diploplia but improved ) Vision Assessment?: Yes Ocular Range of Motion: Restricted on the left Alignment/Gaze Preference: Head turned;Gaze right Tracking/Visual Pursuits: Decreased smoothness of horizontal tracking;Decreased smoothness of eye movement to LEFT superior field;Decreased smoothness of eye movement to LEFT inferior field Saccades: Undershoots Visual Fields: Left visual field deficit Diplopia Assessment: Disappears with one eye closed;Objects split side to side Depth Perception: Undershoots Perception  Perception:  Impaired Inattention/Neglect: Does not attend to left side of body;Does not attend to left visual field Praxis Praxis: Impaired Praxis Impairment Details: Ideomotor;Perseveration Cognition Overall Cognitive Status: Impaired/Different from baseline Arousal/Alertness: Awake/alert Orientation Level: Person;Place;Situation Person: Oriented Place: Oriented Situation: Oriented Year: 2019 Month: July Day of Week: Correct Memory: Impaired Memory Recall: Bed;Blue Memory Recall Blue: Without Cue Memory Recall Bed: Without Cue Attention: Alternating;Selective Selective Attention: Impaired Awareness: Impaired Awareness Impairment: Emergent impairment;Anticipatory impairment Problem Solving: Impaired Safety/Judgment: Impaired Sensation Sensation Light Touch: Impaired Detail Light Touch Impaired Details: Impaired LLE;Impaired LUE Proprioception: Impaired by gross assessment Coordination Gross Motor Movements are Fluid and Coordinated: No Fine Motor Movements are Fluid and  Coordinated: No Coordination and Movement Description: Decreased smoothness and accuracy with L Finger Nose Finger Test: ataxia and overshooting on L Motor  Motor Motor: Hemiplegia;Ataxia;Motor perseverations;Abnormal postural alignment and control Motor - Skilled Clinical Observations: significant ataxia in on the L UE>LE  Mobility  Bed Mobility Supine to Sit: Minimal Assistance - Patient > 75% Sit to Supine: Minimal Assistance - Patient > 75% Transfers Sit to Stand: Minimal Assistance - Patient > 75% Stand to Sit: Minimal Assistance - Patient > 75%  Trunk/Postural Assessment  Cervical Assessment Cervical Assessment: Exceptions to WFL(head turned to the L, Gaze R ) Thoracic Assessment Thoracic Assessment: Exceptions to WFL(lateral L left) Lumbar Assessment Lumbar Assessment: Exceptions to WFL(rigid ) Postural Control Postural Control: Deficits on evaluation Righting Reactions: delayed to the R and  posteriorly  Protective Responses: delayed in standing.   Balance Balance Balance Assessed: Yes Standardized Balance Assessment Standardized Balance Assessment: Berg Balance Test;PASS Static Sitting Balance Static Sitting - Balance Support: Bilateral upper extremity supported Static Sitting - Level of Assistance: 5: Stand by assistance Dynamic Sitting Balance Dynamic Sitting - Balance Support: No upper extremity supported Dynamic Sitting - Level of Assistance: 4: Min assist Sitting balance - Comments: Lateral LOB to L Static Standing Balance Static Standing - Balance Support: During functional activity Static Standing - Level of Assistance: 3: Mod assist;4: Min assist Dynamic Standing Balance Dynamic Standing - Balance Support: During functional activity;Bilateral upper extremity supported Dynamic Standing - Level of Assistance: 2: Max assist;3: Mod assist Dynamic Standing - Balance Activities: Lateral lean/weight shifting Extremity/Trunk Assessment RUE Assessment RUE Assessment: Within Functional Limits LUE Assessment LUE Assessment: Exceptions to [facility] General Strength Comments: Strength overall 4-/5- deficits mostly in ataxia LUE Body System: Neuro Brunstrum levels for arm and hand: Arm;Hand Brunstrum level for arm: Stage V Relative Independence from Synergy Brunstrum level for hand: Stage VI Isolated joint movements   See Function Navigator for Current Functional Status.   Refer to Care Plan for Long Term Goals  Recommendations for other services: None    Discharge Criteria: Patient will be discharged from OT if patient refuses treatment 3 consecutive times without medical reason, if treatment goals not met, if there is a change in medical status, if patient makes no progress towards goals or if patient is discharged from hospital.  The above assessment, treatment plan, treatment alternatives and goals were discussed and mutually agreed upon: by patient  Valma Cava 06/19/2018, 12:44 PM

## 2018-06-19 NOTE — Plan of Care (Signed)
  Problem: Consults Goal: RH STROKE PATIENT EDUCATION Description See Patient Education module for education specifics  Outcome: Progressing   Problem: RH BOWEL ELIMINATION Goal: RH STG MANAGE BOWEL WITH ASSISTANCE Description STG Manage Bowel with  Mod I Assistance.  Outcome: Progressing Goal: RH STG MANAGE BOWEL W/MEDICATION W/ASSISTANCE Description STG Manage Bowel with Medication with mod I Assistance.  Outcome: Progressing   Problem: RH BLADDER ELIMINATION Goal: RH STG MANAGE BLADDER WITH EQUIPMENT WITH ASSISTANCE Description STG Manage Bladder With Equipment With min Assistance  Outcome: Progressing   Problem: RH SAFETY Goal: RH STG ADHERE TO SAFETY PRECAUTIONS W/ASSISTANCE/DEVICE Description STG Adhere to Safety Precautions With cues, supervision Assistance/Device.  Outcome: Progressing   Problem: RH KNOWLEDGE DEFICIT Goal: RH STG INCREASE KNOWLEDGE OF DIABETES Description Pt and family will be able to explain rationale for and schedule for medications,  CBG checks and dietary restrictions using resources/cues/reminders indepentently  Outcome: Progressing Goal: RH STG INCREASE KNOWLEDGE OF HYPERTENSION Description PT will be able to manage HTN with family using resources/cues/reminders/handouts independently  Outcome: Progressing   Problem: RH KNOWLEDGE DEFICIT Goal: RH STG INCREASE KNOWLEDGE OF HYPERTENSION Description PT will be able to manage HTN with family using resources/cues/reminders/handouts independently  Outcome: Progressing

## 2018-06-20 ENCOUNTER — Inpatient Hospital Stay (HOSPITAL_COMMUNITY): Payer: Managed Care, Other (non HMO)

## 2018-06-20 ENCOUNTER — Inpatient Hospital Stay (HOSPITAL_COMMUNITY): Payer: Managed Care, Other (non HMO) | Admitting: Physical Therapy

## 2018-06-20 ENCOUNTER — Inpatient Hospital Stay (HOSPITAL_COMMUNITY): Payer: Managed Care, Other (non HMO) | Admitting: Speech Pathology

## 2018-06-20 ENCOUNTER — Inpatient Hospital Stay (HOSPITAL_COMMUNITY): Payer: Managed Care, Other (non HMO) | Admitting: Occupational Therapy

## 2018-06-20 LAB — GLUCOSE, CAPILLARY
GLUCOSE-CAPILLARY: 172 mg/dL — AB (ref 70–99)
GLUCOSE-CAPILLARY: 174 mg/dL — AB (ref 70–99)
Glucose-Capillary: 164 mg/dL — ABNORMAL HIGH (ref 70–99)
Glucose-Capillary: 154 mg/dL — ABNORMAL HIGH (ref 70–99)
Glucose-Capillary: 144 mg/dL — ABNORMAL HIGH (ref 70–99)

## 2018-06-20 LAB — PROTIME-INR
INR: 2.36
Prothrombin Time: 25.7 seconds — ABNORMAL HIGH (ref 11.4–15.2)

## 2018-06-20 MED ORDER — POTASSIUM CHLORIDE 20 MEQ/15ML (10%) PO SOLN
10.0000 meq | Freq: Two times a day (BID) | ORAL | Status: DC
  Administered 2018-06-20 – 2018-06-28 (×17): 10 meq via ORAL
  Administered 2018-06-29: 20 meq via ORAL
  Administered 2018-06-29 – 2018-07-09 (×20): 10 meq via ORAL
  Filled 2018-06-20 (×39): qty 15

## 2018-06-20 MED ORDER — PATIENT'S GUIDE TO USING COUMADIN BOOK
Freq: Once | Status: AC
  Administered 2018-06-20: 12:00:00
  Filled 2018-06-20: qty 1

## 2018-06-20 NOTE — Progress Notes (Signed)
Physical Therapy Session Note  Patient Details  Name: Karl [name] MRN: [id number] Date of Birth: 1956-04-20  Today's Date: 06/20/2018 PT Individual Time: 6144-3154 PT Individual Time Calculation (min): 53 min   Short Term Goals: Week 1:  PT Short Term Goal 1 (Week 1): pt will perform bed mobility with supervision assist  PT Short Term Goal 2 (Week 1): Pt will perform bed<>WC transfers with min assist  PT Short Term Goal 3 (Week 1): Pt will maintain standing balance with min assist x 2 min   PT Short Term Goal 4 (Week 1): Pt will ambulate 31ft with min assist and LRAD  PT Short Term Goal 5 (Week 1): Pt will attend to L side 52ft of the time with min cues durring functional tasks.   Skilled Therapeutic Interventions/Progress Updates:   pt seated in w/c agreeable to therapy.  Squat pivot transfers throughout session with mod A, manual facilitation for wt shift, tactile cues for Lt trunk activation.  Standing balance with wt shifts, pre gait stepping and mini squats with focus on midline posture and improved balance. Pt requires mod A due to Lt lean in standing, manual facilitation to return to midline.  Gait training with pt's Lt UE over PTs shoulders with mod A with manual facilitation for Lt hip anterior translation, Rt wt shifts during Lt swing phase.  Sit to stand trainign with pt performing ball squeeze with pt requiring min/mod A to stand.  Seated with ball squeeze alternating LAQ with increased time, improving coordination. Pt performs supine NMR with bridging, LTR, PNF diagonals to LT LE. Pt left in bed with alarm set, needs at hand  Therapy Documentation Precautions:  Precautions Precautions: Fall Precaution Comments: poor proprioceptive awareness of Lt  Restrictions Weight Bearing Restrictions: No Pain:  no c/o pain   Therapy/Group: Individual Therapy  [name] 06/20/2018, 1:59 PM

## 2018-06-20 NOTE — Progress Notes (Signed)
Occupational Therapy Session Note  Patient Details  Name: Karl [name] MRN: [id number] Date of Birth: 10/19/1956  Today's Date: 06/20/2018 OT Individual Time: 1015-1130 OT Individual Time Calculation (min): 75 min    Short Term Goals: Week 1:  OT Short Term Goal 1 (Week 1): Pt will recall hemi dressing techniques with min questioning cues OT Short Term Goal 2 (Week 1): Pt will complete sit<>stand with no more than min A in preparation for BADL tasks OT Short Term Goal 3 (Week 1): Pt will complete toilet transfer with min A  Skilled Therapeutic Interventions/Progress Updates:    Pt greeted seated in wc and agreeable to OT treatment session. Pt declined bathing/dressiing this morning despite encouragement. Pt brought to therapy gym in wc and completed stand-pivot to therapy mat with mod A and lateral lean to the L requiring facilitation for pivot and head/hips relationship. Provided pt with yellow thera-putty and worked on L hand coordination with focus on pinch and grip. Sit<>stand from mat with mod A and RW with facilitation for anterior weight shift and verbal cues for hand placement. Placed wedge under buttocks to promote anterior pelvic tilt. While seated on wedge, pt completed graded peg board activity focused on in-hand manipulation, translation, and rotation. Dynamic standing activity using horse shoe toss. Facilitation and verbal cues to maintain midline when tossing horse shoe. Overall mod A for balance 2/2 posterior and lateral lean to the L. Pt returned to room at end of session and left seated in wc with safety belt on and needs met.   Therapy Documentation Precautions:  Precautions Precautions: Fall Precaution Comments: poor proprioceptive awareness of Lt  Restrictions Weight Bearing Restrictions: No Pain:  none/denies pain ADL: ADL ADL Comments: Please see functional navigator  See Function Navigator for Current Functional Status.  Therapy/Group: Individual  Therapy  Valma Cava 06/20/2018, 10:51 AM

## 2018-06-20 NOTE — Progress Notes (Signed)
[facility] PHYSICAL MEDICINE & REHABILITATION     PROGRESS NOTE    Subjective/Complaints: No new problems other than sun coming in through the window. Slept well last night  ROS: Patient denies fever, rash, sore throat, blurred vision, nausea, vomiting, diarrhea, cough, shortness of breath or chest pain, joint or back pain, headache, or mood change.   Objective:  No results found. Recent Labs    06/18/18 0402 06/19/18 0623  WBC 10.0 8.4  HGB 13.4 13.8  HCT 41.9 43.1  PLT 373 370   Recent Labs    06/18/18 0402 06/19/18 0623  NA 135 132*  K 3.5 4.0  CL 97* 96*  GLUCOSE 156* 160*  BUN 16 19  CREATININE 1.13 1.27*  CALCIUM 9.1 9.0   CBG (last 3)  Recent Labs    06/19/18 1642 06/19/18 2246 06/20/18 0635  GLUCAP 133* 164* 154*    Wt Readings from Last 3 Encounters:  06/19/18 90.6 kg (199 lb 11.8 oz)  06/14/18 105.2 kg (232 lb)  06/11/18 105.2 kg (232 lb)     Intake/Output Summary (Last 24 hours) at 06/20/2018 0929 Last data filed at 06/20/2018 0000 Gross per 24 hour  Intake 750 ml  Output 575 ml  Net 175 ml    Vital Signs: Blood pressure 126/88, pulse 72, temperature 98 F (36.7 C), temperature source Oral, resp. rate 17, height 6\' 2"  (1.88 m), weight 90.6 kg (199 lb 11.8 oz), SpO2 98 %. Physical Exam:  Constitutional: No distress . Vital signs reviewed. HEENT: EOMI, oral membranes moist Neck: supple Cardiovascular: RRR without murmur. No JVD    Respiratory: CTA Bilaterally without wheezes or rales. Normal effort    GI: BS +, non-tender, non-distended    Musculoskeletal: Normal range of motion.  Neurological:  Left central 7. Speech intelligible, sl dysarthria. Motor 5/5 RUE and RLE. LUE 4/5 prox to distal. LLE 4+/5.--stable motor exam.  No focal sensory changes. Normal insight and awareness Psych: pleasant   Assessment/Plan: 1. Functional and mobility deficits secondary to bilateral posterior circulation infarcts which require 3+ hours per day of  interdisciplinary therapy in a comprehensive inpatient rehab setting. Physiatrist is providing close team supervision and 24 hour management of active medical problems listed below. Physiatrist and rehab team continue to assess barriers to discharge/monitor patient progress toward functional and medical goals.  Function:  Bathing Bathing position Bathing activity did not occur: N/A Position: Shower  Bathing parts Body parts bathed by patient: Right arm, Left arm, Chest, Abdomen, Front perineal area, Right upper leg, Left upper leg Body parts bathed by helper: Right lower leg, Left lower leg, Buttocks  Bathing assist Assist Level: Touching or steadying assistance(Pt > 75%)      Upper Body Dressing/Undressing Upper body dressing   What is the patient wearing?: Pull over shirt/dress     Pull over shirt/dress - Perfomed by patient: Thread/unthread right sleeve, Thread/unthread left sleeve, Put head through opening, Pull shirt over trunk          Upper body assist Assist Level: Set up, Supervision or verbal cues      Lower Body Dressing/Undressing Lower body dressing Lower body dressing/undressing activity did not occur: N/A What is the patient wearing?: Non-skid slipper socks, Pants     Pants- Performed by patient: Thread/unthread right pants leg Pants- Performed by helper: Thread/unthread left pants leg, Pull pants up/down Non-skid slipper socks- Performed by patient: Don/doff right sock Non-skid slipper socks- Performed by helper: Don/doff left sock  Lower body assist Assist for lower body dressing: Touching or steadying assistance (Pt > 75%)      Toileting Toileting     Toileting steps completed by helper: Adjust clothing prior to toileting, Performs perineal hygiene, Adjust clothing after toileting Toileting Assistive Devices: Prosthesis/orthosis  Toileting assist Assist level: Two helpers   Transfers Chair/bed transfer   Chair/bed transfer  method: Stand pivot Chair/bed transfer assist level: Moderate assist (Pt 50 - 74%/lift or lower) Chair/bed transfer assistive device: Armrests     Locomotion Ambulation     Max distance: 26 Assist level: 2 helpers   Wheelchair   Type: Manual Max wheelchair distance: 100 Assist Level: Touching or steadying assistance (Pt > 75%)  Cognition Comprehension Comprehension assist level: Understands basic 90% of the time/cues < 10% of the time  Expression Expression assist level: Expresses basic 90% of the time/requires cueing < 10% of the time.  Social Interaction Social Interaction assist level: Interacts appropriately 90% of the time - Needs monitoring or encouragement for participation or interaction.  Problem Solving Problem solving assist level: Solves basic 75 - 89% of the time/requires cueing 10 - 24% of the time  Memory Memory assist level: Recognizes or recalls 75 - 89% of the time/requires cueing 10 - 24% of the time   Medical Problem List and Plan:  1. Functional deficits secondary to bilateral posterior circulation, embolic infarcts  -continue therapies 2. DVT Prophylaxis/Anticoagulation: Pharmaceutical: Coumadin INR 1.9 3. Pain Management: tylenol prn  4. Mood: LCSW to follow for evaluation and support.  5. Neuropsych: This patient is capable of making decisions on his own behalf.  6. Skin/Wound Care: routine pressure relief measures.  7. Fluids/Electrolytes/Nutrition: Monitor I/O. 8. CAD with ICM: Coumadin with INR goal 2-3.Marland Kitchen Continue coreg and Crestor.  9.T2DM: Resumed metformin at 500 mg bid and titrate towards home dose.  -improving patterns yesterday---observe today before making any changes 10. Hypokalemia: Improved. Will continue supplement for a couple more days.  11. HTN: Monitor BP bid--on coreg bid. Off Vasotec at this time to allow for adequate perfusion, permissive hypertension   -reasonable control 7/18 12. TURP/BPH: ON Proscar and Flomax.   -foley removed  with accetable PVR's  -keflex dc'ed   LOS (Days) 2 A FACE TO FACE EVALUATION WAS PERFORMED  Ranelle Oyster, MD 06/20/2018 9:29 AM

## 2018-06-20 NOTE — Progress Notes (Signed)
ANTICOAGULATION CONSULT NOTE - Follow Up Consult  Pharmacy Consult for warfarin Indication: stroke  Allergies  Allergen Reactions  . Lipitor [Atorvastatin] Nausea And Vomiting    Patient Measurements: Height: 6\' 2"  (188 cm) Weight: 199 lb 11.8 oz (90.6 kg) IBW/kg (Calculated) : 82.2  Vital Signs: Temp: 98 F (36.7 C) (07/18 0606) Temp Source: Oral (07/18 0606) BP: 126/88 (07/18 0606) Pulse Rate: 72 (07/18 0606)  Labs: Recent Labs    06/18/18 0402 06/19/18 0623 06/20/18 0605  HGB 13.4 13.8  --   HCT 41.9 43.1  --   PLT 373 370  --   LABPROT 24.4* 22.1* 25.7*  INR 2.22 1.95 2.36  CREATININE 1.13 1.27*  --     Estimated Creatinine Clearance: 69.2 mL/min (A) (by C-G formula based on SCr of 1.27 mg/dL (H)).  Assessment: 71 yoM on warfarin PTA for hx CVA- held on admission for a scheduled procedure, per report patient restart post-procedure, but INR was subtherapeutic on admission. [facility] with RLE numbness, dizziness, and speech deficits and noted to have left chronic cerebellar cva and acute occipital lobe CVA.  INR 2.36 Continue Warfarin 10 mg daily.   Goal of Therapy:  INR 2-3  Monitor platelets by anticoagulation protocol: Yes   Plan:  Warfarin 10mg  po daily  Daily INR for now Follow for s/s bleeding  Robyne Askew Clinical Pharmacist [phone] Please check AMION for all [facility] Pharmacy numbers 06/20/2018 8:17 AM

## 2018-06-20 NOTE — IPOC Note (Signed)
Overall Plan of Care [facility]) Patient Details Name: Karl [name] MRN: [id number] DOB: [birth year]  Admitting Diagnosis: Acute ischemic right PCA stroke [facility])  Hospital Problems: Principal Problem:   Acute ischemic right PCA stroke (HCC) Active Problems:   Essential hypertension   DM (diabetes mellitus), type 2, uncontrolled, periph vascular complic (HCC)   Ischemic cardiomyopathy     Functional Problem List: Nursing Bladder, Bowel, Safety, Medication Management  PT Balance, Behavior, Endurance, Motor, Nutrition, Perception, Safety, Sensory  OT Balance, Cognition, Endurance, Motor, Perception, Safety, Sensory  SLP    TR         Basic ADL's: OT Eating, Grooming, Dressing, Toileting, Bathing     Advanced  ADL's: OT       Transfers: PT Bed Mobility, Bed to Chair, Car, [employer], Civil Service fast streamer, Research scientist (life sciences): PT Ambulation, Psychologist, prison and probation services, Stairs     Additional Impairments: OT Fuctional Use of Upper Extremity  SLP Swallowing, Social Cognition   Problem Solving, Memory, Awareness  TR      Anticipated Outcomes Item Anticipated Outcome  Self Feeding Mod I  Swallowing  Mod I    Basic self-care  Supervision  Toileting  Supervision   Bathroom Transfers Supervision  Bowel/Bladder  manage bladder with min assist/bowel with mod I assist  Transfers  Supervision Assist with LRAD   Locomotion  Ambulatory at Supervision assist with LRAD   Communication     Cognition  Supervision   Pain     Safety/Judgment      Therapy Plan: PT Intensity: Minimum of 1-2 x/day ,45 to 90 minutes PT Frequency: 5 out of 7 days PT Duration Estimated Length of Stay: 14-18 days  OT Intensity: Minimum of 1-2 x/day, 45 to 90 minutes OT Frequency: 5 out of 7 days OT Duration/Estimated Length of Stay: 14-17 days SLP Intensity: Minumum of 1-2 x/day, 30 to 90 minutes SLP Frequency: 3 to 5 out of 7 days SLP Duration/Estimated Length of Stay: 14-17 days     Team  Interventions: Nursing Interventions Patient/Family Education, Disease Management/Prevention, Discharge Planning, Bladder Management, Bowel Management, Medication Management  PT interventions Ambulation/gait training, Balance/vestibular training, Discharge planning, Community reintegration, Cognitive remediation/compensation, Disease management/prevention, DME/adaptive equipment instruction, Functional electrical stimulation, Functional mobility training, Neuromuscular re-education, Patient/family education, Pain management, Psychosocial support, Skin care/wound management, Stair training, Splinting/orthotics, Therapeutic Activities, Therapeutic Exercise, UE/LE Coordination activities, UE/LE Strength taining/ROM, Visual/perceptual remediation/compensation, Wheelchair propulsion/positioning  OT Interventions Warden/ranger, Cognitive remediation/compensation, Firefighter, Discharge planning, DME/adaptive equipment instruction, Functional mobility training, Neuromuscular re-education, Patient/family education, Self Care/advanced ADL retraining, Therapeutic Activities, Therapeutic Exercise, UE/LE Strength taining/ROM, UE/LE Coordination activities, Visual/perceptual remediation/compensation, Wheelchair propulsion/positioning  SLP Interventions Cognitive remediation/compensation, Financial trader, Environmental controls, Functional tasks, Patient/family education, Therapeutic Activities, Internal/external aids  TR Interventions    SW/CM Interventions Discharge Planning, Psychosocial Support, Patient/Family Education   Barriers to Discharge MD  Medical stability  Nursing      PT Inaccessible home environment, Decreased caregiver support    OT      SLP      SW       Team Discharge Planning: Destination: PT-Home ,OT- Home , SLP-Home Projected Follow-up: PT-Home health PT, OT-  Home health OT, SLP-Home Health SLP, 24 hour supervision/assistance Projected Equipment Needs:  PT-Rolling walker with 5" wheels, OT- To be determined, SLP-None recommended by SLP Equipment Details: PT- , OT-  Patient/family involved in discharge planning: PT- Patient,  OT-Patient, SLP-Patient  MD ELOS: 14-17 days Medical Rehab Prognosis:  Excellent Assessment: The  patient has been admitted for CIR therapies with the diagnosis of pca infarcts. The team will be addressing functional mobility, strength, stamina, balance, safety, adaptive techniques and equipment, self-care, bowel and bladder mgt, patient and caregiver education, NMR, vestibular rx, community reentry, ego support. Goals have been set at supervision for mobility and self-care tasks.    Ranelle Oyster, MD, FAAPMR      See Team Conference Notes for weekly updates to the plan of care

## 2018-06-20 NOTE — Progress Notes (Signed)
Speech Language Pathology Daily Session Note  Patient Details  Name: Karl [name] MRN: [id number] Date of Birth: 07/04/1956  Today's Date: 06/20/2018 SLP Individual Time: 6811-5726 SLP Individual Time Calculation (min): 55 min  Short Term Goals: Week 1: SLP Short Term Goal 1 (Week 1): Patient will consume current diet with minimal overt s/s of aspiration or globus sensation with Mod I.  SLP Short Term Goal 2 (Week 1): Patient will demonstrate functional problem solving for mildly complex tasks with Min A verbal cues.  SLP Short Term Goal 3 (Week 1): Patient will recall new, daily information with supervision verbal cues.  SLP Short Term Goal 4 (Week 1): Patient will attend to left field of enviornment during functional tasks with supervision verbal cues.   Skilled Therapeutic Interventions: Skilled treatment session focused on dysphagia and cognitive goals. Upon arrival, patient competing breakfast meal of Dys. 2 textures with thin liquids. Patient completed meal without overt s/s of aspiration but required extra time for mastication. Recommend patient continue current diet with intermittent supervision. SLP also facilitated session by providing supervision verbal cues for problem solving and Mod A verbal cues for recall during a basic money management task. Patient also required Min A verbal cues for attention to left field of environment while reading a medication label. Patient left upright in bed with alarm on and all needs within reach. Continue with current plan of care.      Function:  Eating Eating   Modified Consistency Diet: Yes Eating Assist Level: Set up assist for;More than reasonable amount of time;Supervision or verbal cues   Eating Set Up Assist For: Opening containers       Cognition Comprehension Comprehension assist level: Understands basic 90% of the time/cues < 10% of the time  Expression   Expression assist level: Expresses basic 90% of the time/requires cueing  < 10% of the time.  Social Interaction Social Interaction assist level: Interacts appropriately 90% of the time - Needs monitoring or encouragement for participation or interaction.  Problem Solving Problem solving assist level: Solves basic 75 - 89% of the time/requires cueing 10 - 24% of the time  Memory Memory assist level: Recognizes or recalls 50 - 74% of the time/requires cueing 25 - 49% of the time    Pain No/Denies Pain   Therapy/Group: Individual Therapy  [name] 06/20/2018, 3:48 PM

## 2018-06-20 NOTE — Plan of Care (Signed)
Pt has not had to be cath'd during day shift.  Pt does require assistance with urinal.  PVR still being recorded at this time.

## 2018-06-21 ENCOUNTER — Inpatient Hospital Stay (HOSPITAL_COMMUNITY): Payer: Managed Care, Other (non HMO) | Admitting: Occupational Therapy

## 2018-06-21 ENCOUNTER — Inpatient Hospital Stay (HOSPITAL_COMMUNITY): Payer: Managed Care, Other (non HMO)

## 2018-06-21 ENCOUNTER — Inpatient Hospital Stay (HOSPITAL_COMMUNITY): Payer: Managed Care, Other (non HMO) | Admitting: Speech Pathology

## 2018-06-21 LAB — GLUCOSE, CAPILLARY
GLUCOSE-CAPILLARY: 176 mg/dL — AB (ref 70–99)
GLUCOSE-CAPILLARY: 164 mg/dL — AB (ref 70–99)
GLUCOSE-CAPILLARY: 133 mg/dL — AB (ref 70–99)
Glucose-Capillary: 159 mg/dL — ABNORMAL HIGH (ref 70–99)

## 2018-06-21 LAB — PROTIME-INR
INR: 2.57
Prothrombin Time: 27.4 seconds — ABNORMAL HIGH (ref 11.4–15.2)

## 2018-06-21 MED ORDER — PREMIER PROTEIN SHAKE
2.0000 [oz_av] | Freq: Three times a day (TID) | ORAL | Status: DC
  Administered 2018-06-21 – 2018-06-27 (×14): 2 [oz_av] via ORAL
  Filled 2018-06-21 (×37): qty 325.31

## 2018-06-21 MED ORDER — METFORMIN HCL 850 MG PO TABS
850.0000 mg | ORAL_TABLET | Freq: Two times a day (BID) | ORAL | Status: DC
  Administered 2018-06-21 – 2018-07-01 (×20): 850 mg via ORAL
  Filled 2018-06-21 (×21): qty 1

## 2018-06-21 NOTE — Progress Notes (Signed)
Social Work  Social Work Assessment and Plan  Patient Details  Name: Karl [name] MRN: [id number] Date of Birth: 17-Feb-1956  Today's Date: 06/21/2018  Problem List:  Patient Active Problem List   Diagnosis Date Noted  . Acute ischemic right PCA stroke (HCC) 06/18/2018  . Ischemic cardiomyopathy   . CVA (cerebral vascular accident) (HCC) 06/14/2018  . Urinary retention 06/05/2018  . Encounter for therapeutic drug monitoring 02/08/2018  . History of CVA (cerebrovascular accident) 12/20/2017  . Coronary artery disease 12/19/2017  . Mild renal insufficiency 12/19/2017  . TIA (transient ischemic attack) 12/19/2017  . Hip flexor tendinitis 05/22/2017  . Abscess of back 05/22/2017  . Pain of both hip joints 04/25/2017  . Dyslipidemia 09/01/2015  . BPH without urinary obstruction 09/01/2015  . DM (diabetes mellitus), type 2, uncontrolled, periph vascular complic (HCC) 07/10/2014  . Essential hypertension 04/20/2009  . CARDIOMYOPATHY, ISCHEMIC 04/20/2009  . LEFT BUNDLE BRANCH BLOCK 04/20/2009  . Chronic systolic heart failure (HCC) 04/20/2009   Past Medical History:  Past Medical History:  Diagnosis Date  . Acute CVA (cerebrovascular accident) (HCC) 06/14/2018  . Coronary artery disease    blockage   Stent Dr. Ladona [name] 15 years 1998  . Dyspnea   . Hypertension   . Stroke [facility]) 2019   denies residual on 06/14/2018  . Type II diabetes mellitus (HCC) 07/2014 dx   Past Surgical History:  Past Surgical History:  Procedure Laterality Date  . CORONARY ANGIOPLASTY WITH STENT PLACEMENT  1998  . FRACTURE SURGERY    . GREEN LIGHT LASER TURP (TRANSURETHRAL RESECTION OF PROSTATE N/A 06/05/2018   Procedure: GREEN LIGHT LASER TURP , TRANSURETHRAL RESECTION OF PROSTATE;  Surgeon: Karl Elliot, MD;  Location: [facility] ORS;  Service: Urology;  Laterality: N/A;  . TIBIA FRACTURE SURGERY Left 1980s   Social History:  reports that he quit smoking about 17 years ago. His smoking use included  cigarettes. He has a 10.00 pack-year smoking history. He has never used smokeless tobacco. He reports that he drinks alcohol. He reports that he has current or past drug history.  Family / Support Systems Marital Status: Animal nutritionist, pt notes he and wife have been separated ~ 60yr (not legally)) Patient Roles: Spouse, Parent Spouse/Significant Other: wife (she and pt are separated), Karl [name] @ (C) [phone] Children: daughter, Karl [name] (she and her two sons live with pt.) @ (C) [phone]; daughter, Karl [name] (local) @ (C) [phone] Other Supports: grandaughter, Karl [name] @ [phone] Anticipated Caregiver: daughter, Karl [name] to be primary contact Ability/Limitations of Caregiver: None -  between family members someone is always there  Caregiver Availability: 24/7 Family Dynamics: Patient reports he and wife are separated, however, states their relationship is good and feels she will help if needed.  Notes that there will always be someone in the home with caregiver being his daughter Karl [name].  Social History Preferred language: English Religion: Baptist Cultural Background: NA Read: Yes Write: Yes Employment Status: Employed Length of Employment: 14(yrs) Return to Work Plans: Pt very hopeful he will be able to return to work. Legal Hisotry/Current Legal Issues: Pt and spouse are separated, however, this is not a legal separation. Guardian/Conservator: None - per MD, pt is fully capable of making decisions on his own behalf.   Abuse/Neglect Abuse/Neglect Assessment Can Be Completed: Yes Physical Abuse: Denies Verbal Abuse: Denies Sexual Abuse: Denies Exploitation of patient/patient's resources: Denies Self-Neglect: Denies  Emotional Status Pt's affect, behavior adn adjustment status: Very pleasant gentleman sitting up in  bed and completes assessment interview without any difficulty.  Patient denies any significant emotional distress and is future focused  with hopes to be able to return to work. Recent Psychosocial Issues: None Pyschiatric History: None Substance Abuse History: None  Patient / Family Perceptions, Expectations & Goals Pt/Family understanding of illness & functional limitations: Patient and daughter have a good understanding of his and current functional limitations/need for CIR. Premorbid pt/family roles/activities: Patient working full-time and completely independent PTA. Anticipated changes in roles/activities/participation: Per goals of supervision, daughter aware family will need to provide caregiver support to patient at least initially upon discharge. Pt/family expectations/goals: "I just hope I can get better here and get back to work."  [employer]: None Premorbid Home Care/DME Agencies: None Transportation available at discharge: Yes Resource referrals recommended: Neuropsychology, Support group (specify)  Discharge Planning Living Arrangements: Children, Other relatives Support Systems: Spouse/significant other, Children, Other relatives Type of Residence: Private residence Insurance Resources: Media planner (specify)([employer]) Financial Resources: Employment Financial Screen Referred: No Money Management: Patient Does the patient have any problems obtaining your medications?: No Home Management: Patient and family share responsibilities for the home. Patient/Family Preliminary Plans: Patient to DC home with daughter, Karl [name], and other family members providing any needed supervision or assistance. Expected length of stay: 14-17 days  Clinical Impression Pleasant gentleman CIR following bilateral CVAs and team goals targeted for supervision level.  Patient completes assessment interview without any difficulty and denies any emotional distress.  Patient with good family support and daughter notes they can provide 24/7 assistance as needed.  Social work to follow for support and  discharge planning needs.  Karl [name] 06/21/2018, 4:45 PM

## 2018-06-21 NOTE — Progress Notes (Signed)
[place] PHYSICAL MEDICINE & REHABILITATION     PROGRESS NOTE    Subjective/Complaints: Noticed some numbness in his right foot last night which lasted several minutes. No other issues since  ROS: Patient denies fever, rash, sore throat, blurred vision, nausea, vomiting, diarrhea, cough, shortness of breath or chest pain, joint or back pain, headache, or mood change.    Objective:  No results found. Recent Labs    06/19/18 0623  WBC 8.4  HGB 13.8  HCT 43.1  PLT 370   Recent Labs    06/19/18 0623  NA 132*  K 4.0  CL 96*  GLUCOSE 160*  BUN 19  CREATININE 1.27*  CALCIUM 9.0   CBG (last 3)  Recent Labs    06/20/18 1640 06/20/18 2111 06/21/18 0630  GLUCAP 144* 174* 159*    Wt Readings from Last 3 Encounters:  06/19/18 90.6 kg (199 lb 11.8 oz)  06/14/18 105.2 kg (232 lb)  06/11/18 105.2 kg (232 lb)     Intake/Output Summary (Last 24 hours) at 06/21/2018 0908 Last data filed at 06/21/2018 0800 Gross per 24 hour  Intake 770 ml  Output 425 ml  Net 345 ml    Vital Signs: Blood pressure 140/82, pulse 77, temperature 98.4 F (36.9 C), temperature source Oral, resp. rate 15, height 6\' 2"  (1.88 m), weight 90.6 kg (199 lb 11.8 oz), SpO2 100 %. Physical Exam:  Constitutional: No distress . Vital signs reviewed. HEENT: EOMI, oral membranes moist Neck: supple Cardiovascular: RRR without murmur. No JVD    Respiratory: CTA Bilaterally without wheezes or rales. Normal effort    GI: BS +, non-tender, non-distended  Musculoskeletal: Normal range of motion.  Neurological:  Left central 7. Speech intelligible, sl dysarthria. Motor 5/5 RUE and RLE. LUE 4 to 4+/5 prox to distal. LLE 4+/5.--stable motor exam.  No sensory abnl.  Normal insight and awareness Psych: pleasant   Assessment/Plan: 1. Functional and mobility deficits secondary to bilateral posterior circulation infarcts which require 3+ hours per day of interdisciplinary therapy in a comprehensive inpatient  rehab setting. Physiatrist is providing close team supervision and 24 hour management of active medical problems listed below. Physiatrist and rehab team continue to assess barriers to discharge/monitor patient progress toward functional and medical goals.  Function:  Bathing Bathing position Bathing activity did not occur: N/A Position: Shower  Bathing parts Body parts bathed by patient: Right arm, Left arm, Chest, Abdomen, Front perineal area, Right upper leg, Left upper leg Body parts bathed by helper: Right lower leg, Left lower leg, Buttocks  Bathing assist Assist Level: Touching or steadying assistance(Pt > 75%)      Upper Body Dressing/Undressing Upper body dressing   What is the patient wearing?: Pull over shirt/dress     Pull over shirt/dress - Perfomed by patient: Thread/unthread right sleeve, Thread/unthread left sleeve, Put head through opening, Pull shirt over trunk          Upper body assist Assist Level: Set up, Supervision or verbal cues      Lower Body Dressing/Undressing Lower body dressing Lower body dressing/undressing activity did not occur: N/A What is the patient wearing?: Non-skid slipper socks, Pants     Pants- Performed by patient: Thread/unthread right pants leg Pants- Performed by helper: Thread/unthread left pants leg, Pull pants up/down Non-skid slipper socks- Performed by patient: Don/doff right sock Non-skid slipper socks- Performed by helper: Don/doff left sock  Lower body assist Assist for lower body dressing: Touching or steadying assistance (Pt > 75%)      Toileting Toileting     Toileting steps completed by helper: Performs perineal hygiene, Adjust clothing prior to toileting, Adjust clothing after toileting Toileting Assistive Devices: Prosthesis/orthosis  Toileting assist Assist level: Two helpers   Transfers Chair/bed transfer   Chair/bed transfer method: Stand pivot Chair/bed transfer assist level:  Moderate assist (Pt 50 - 74%/lift or lower) Chair/bed transfer assistive device: Armrests     Locomotion Ambulation     Max distance: 26 Assist level: 2 helpers   Wheelchair   Type: Manual Max wheelchair distance: 100 Assist Level: Touching or steadying assistance (Pt > 75%)  Cognition Comprehension Comprehension assist level: Understands basic 90% of the time/cues < 10% of the time  Expression Expression assist level: Expresses basic 90% of the time/requires cueing < 10% of the time.  Social Interaction Social Interaction assist level: Interacts appropriately 90% of the time - Needs monitoring or encouragement for participation or interaction.  Problem Solving Problem solving assist level: Solves basic 75 - 89% of the time/requires cueing 10 - 24% of the time  Memory Memory assist level: Recognizes or recalls 90% of the time/requires cueing < 10% of the time   Medical Problem List and Plan:  1. Functional deficits secondary to bilateral posterior circulation, embolic infarcts  -continue therapies PT, OT, SLP -neurological exam stable to improved today 2. DVT Prophylaxis/Anticoagulation: Pharmaceutical: Coumadin INR 2.57 7/19 3. Pain Management: tylenol prn  4. Mood: LCSW to follow for evaluation and support.  5. Neuropsych: This patient is capable of making decisions on his own behalf.  6. Skin/Wound Care: routine pressure relief measures.  7. Fluids/Electrolytes/Nutrition: Monitor I/O. 8. CAD with ICM: Coumadin with INR goal 2-3.Marland Kitchen Continue coreg and Crestor.  9.T2DM: Resumed metformin at 500 mg bid and titrate towards home dose of 1000mg  bid  -improving control with metformin. Increase to 850mg  bid 10. Hypokalemia: Improved. Will continue supplement for a couple more days.  11. HTN: Monitor BP bid--on coreg bid. Off Vasotec at this time to allow for adequate perfusion, permissive hypertension   -reasonable control 7/19 12. TURP/BPH: ON Proscar and Flomax.   -foley removed with  accetable PVR's  -off of keflex   LOS (Days) 3 A FACE TO FACE EVALUATION WAS PERFORMED  Ranelle Oyster, MD 06/21/2018 9:08 AM

## 2018-06-21 NOTE — Progress Notes (Signed)
Speech Language Pathology Daily Session Note  Patient Details  Name: Karl [name] MRN: [id number] Date of Birth: 1956-06-09  Today's Date: 06/21/2018 SLP Individual Time: 1105-1200 SLP Individual Time Calculation (min): 55 min  Short Term Goals: Week 1: SLP Short Term Goal 1 (Week 1): Patient will consume current diet with minimal overt s/s of aspiration or globus sensation with Mod I.  SLP Short Term Goal 2 (Week 1): Patient will demonstrate functional problem solving for mildly complex tasks with Min A verbal cues.  SLP Short Term Goal 3 (Week 1): Patient will recall new, daily information with supervision verbal cues.  SLP Short Term Goal 4 (Week 1): Patient will attend to left field of enviornment during functional tasks with supervision verbal cues.   Skilled Therapeutic Interventions: Skilled treatment session focused on cognitive goals. SLP facilitated session by providing Mod A verbal cues for recall of his current medications and their functions and Min A verbal cues with extra time while organizing a BID pill box for problem solving. However, patient only required supervision verbal cues for attention to left field of environment throughout task. Due to patient requiring extra time, task was unable to be completed this session. Patient left upright in recliner with all needs within reach and alarm on. Continue with current plan of care.      Function:   Cognition Comprehension Comprehension assist level: Understands basic 90% of the time/cues < 10% of the time  Expression   Expression assist level: Expresses basic 90% of the time/requires cueing < 10% of the time.  Social Interaction Social Interaction assist level: Interacts appropriately 90% of the time - Needs monitoring or encouragement for participation or interaction.  Problem Solving Problem solving assist level: Solves basic 75 - 89% of the time/requires cueing 10 - 24% of the time  Memory Memory assist level: Recognizes  or recalls 90% of the time/requires cueing < 10% of the time    Pain No/Denies Pain   Therapy/Group: Individual Therapy  Desire Fulp 06/21/2018, 3:38 PM

## 2018-06-21 NOTE — Care Management (Signed)
[facility] Individual Statement of Services  Patient Name:  Karl [name]  Date:  06/21/2018  Welcome to the [facility].  Our goal is to provide you with an individualized program based on your diagnosis and situation, designed to meet your specific needs.  With this comprehensive rehabilitation program, you will be expected to participate in at least 3 hours of rehabilitation therapies Monday-Friday, with modified therapy programming on the weekends.  Your rehabilitation program will include the following services:  Physical Therapy (PT), Occupational Therapy (OT), Speech Therapy (ST), 24 hour per day rehabilitation nursing, Therapeutic Recreaction (TR), Neuropsychology, Case Management (Social Worker) and Rehabilitation Medicine  Weekly team conferences will be held on Tuesdays to discuss your progress.  Your Social Worker will talk with you frequently to get your input and to update you on team discussions.  Team conferences with you and your family in attendance may also be held.  Expected length of stay: 14-17 days   Overall anticipated outcome: supervision  Depending on your progress and recovery, your program may change. Your Social Worker will coordinate services and will keep you informed of any changes. Your Social Worker's name and contact numbers are listed  below.  The following services may also be recommended but are not provided by the [facility]:   Driving Evaluations  Home Health Rehabiltiation Services  Outpatient Rehabilitation Services   Arrangements will be made to provide these services after discharge if needed.  Arrangements include referral to agencies that provide these services.  Your insurance has been verified to be:  [place] Your primary doctor is:  Cheryll Cockayne  Pertinent information will be shared with your doctor and your insurance company.  Social Worker:  [place], [place] [phone] or ([phone]   Information discussed with and copy given to patient by: Amada Jupiter, 06/21/2018, 4:46 PM

## 2018-06-21 NOTE — Progress Notes (Signed)
Occupational Therapy Session Note  Patient Details  Name: Karl [name] MRN: [id number] Date of Birth: 05/30/1956  Today's Date: 06/21/2018 OT Individual Time: 1002-1102 OT Individual Time Calculation (min): 60 min   Short Term Goals: Week 1:  OT Short Term Goal 1 (Week 1): Pt will recall hemi dressing techniques with min questioning cues OT Short Term Goal 2 (Week 1): Pt will complete sit<>stand with no more than min A in preparation for BADL tasks OT Short Term Goal 3 (Week 1): Pt will complete toilet transfer with min A  Skilled Therapeutic Interventions/Progress Updates:    Pt greeted seated in recliner with nursing present to remove IV. Pt transfer onto tub bench using Stedy with min A to stand. Pt then was able to swing legs over to face shower head. Bathing completed with focus on forced use of L UE to wash body parts. Pt clumsy with wash cloth and dropped cloth 5x during shower. Had pt wring out wash clothes for BUE coordination and dry shower stall using L UE for neuro re-ed. Stand-pivot out of shower with min A to stand and min A to facilitate pivot to the L. Dressing completed wc at the sink with education provided for hemi dressing techniques. Multiple sit<>stands with min A and verbal/tactile cues for LLE placement and weight shift. Forced use of L UE to pull up pants in standing. Stand-pivot wc to recliner at end of session with increased time and min A. Pt left seated in recliner with safety alarm belt on and needs met.   Therapy Documentation Precautions:  Precautions Precautions: Fall Precaution Comments: poor proprioceptive awareness of Lt  Restrictions Weight Bearing Restrictions: No Pain:  none/denies pain ADL: ADL ADL Comments: Please see functional navigator  See Function Navigator for Current Functional Status.   Therapy/Group: Individual Therapy   S  06/21/2018, 10:24 AM  

## 2018-06-21 NOTE — Progress Notes (Signed)
ANTICOAGULATION CONSULT NOTE - Follow Up Consult  Pharmacy Consult for warfarin Indication: stroke  Allergies  Allergen Reactions  . Lipitor [Atorvastatin] Nausea And Vomiting    Patient Measurements: Height: 6\' 2"  (188 cm) Weight: 199 lb 11.8 oz (90.6 kg) IBW/kg (Calculated) : 82.2  Vital Signs: Temp: 98.4 F (36.9 C) (07/19 0358) Temp Source: Oral (07/19 0358) BP: 140/82 (07/19 0358) Pulse Rate: 77 (07/19 0358)  Labs: Recent Labs    06/19/18 0623 06/20/18 0605 06/21/18 0546  HGB 13.8  --   --   HCT 43.1  --   --   PLT 370  --   --   LABPROT 22.1* 25.7* 27.4*  INR 1.95 2.36 2.57  CREATININE 1.27*  --   --     Estimated Creatinine Clearance: 69.2 mL/min (A) (by C-G formula based on SCr of 1.27 mg/dL (H)).  Assessment: 48 yoM on warfarin PTA for hx CVA- held on admission for a scheduled procedure, per report patient restart post-procedure, but INR was subtherapeutic on admission. 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 Pharmacy consulted for warfarin dosing.  INR 2.57 Continue Warfarin 10 mg daily  H/H stable, no s/s of bleeding noted.   Goal of Therapy:  INR 2-3  Monitor platelets by anticoagulation protocol: Yes   Plan:  Warfarin 10mg  PO daily  Monitor daily INR and follow for s/s bleeding  Thank you for the consult. Please contact with any questions.   Gwynneth Albright, Ilda Basset D PGY1 Pharmacy Resident  Phone [phone] 06/21/2018   11:11 AM

## 2018-06-21 NOTE — Progress Notes (Signed)
Physical Therapy Session Note  Patient Details  Name: Karl [name] MRN: [id number] Date of Birth: 12/23/1955  Today's Date: 06/21/2018 PT Individual Time: 1415-1530 PT Individual Time Calculation (min): 75 min   Short Term Goals: Week 1:  PT Short Term Goal 1 (Week 1): pt will perform bed mobility with supervision assist  PT Short Term Goal 2 (Week 1): Pt will perform bed<>WC transfers with min assist  PT Short Term Goal 3 (Week 1): Pt will maintain standing balance with min assist x 2 min   PT Short Term Goal 4 (Week 1): Pt will ambulate 25ft with min assist and LRAD  PT Short Term Goal 5 (Week 1): Pt will attend to L side 89ft of the time with min cues durring functional tasks.   Skilled Therapeutic Interventions/Progress Updates:    pt able to come to EOB with supervision and extra time, cues to scoot forward enough for feet flat on floor to prepare for transfer. Mod assist for stand pivot to w/c with cues for technique. Pt request to use bathroom, utilized Stedy for transfer and for standing balance during clothing management with min assist due to L lateral lean and extra time due to impaired coordination in LUE. Min assist for sit <> stands in stedy. NMR for gait training without AD with LUE over PT for support x 15' with mod assist for facilitation for weightshift - cues for step length on L and noted wide BOS. NMR for blocked practice sit <> stands without AD, standing balance and weightshifting, and postural control retraining in standing with mirror for visual feedback progressing to dynamic balance activity while putting together pipe tree as fine motor task. Pt with min to mod assist for sit <> stands and cues for upright posture once in standing with noted wide BOS and tendency for posterior and L lateral lean especially as fatigued. Pt requires verbal and tactile cues to correct for L lateral lean which increased with demand of activity and fatigue. Performed basic transfer to/from  w/c with min to mod assist and extra time. Nustep for reciprocal movement pattern re-training x 10 min on level 3 with BUE and BLE with focus on consistent pace. Pt required mod assist for transfer from w/c to recliner and set up with all needs in reach.   Therapy Documentation Precautions:  Precautions Precautions: Fall Precaution Comments: poor proprioceptive awareness of Lt  Restrictions Weight Bearing Restrictions: No   Pain:  Denies pain.  See Function Navigator for Current Functional Status.   Therapy/Group: Individual Therapy  Karolee Stamps Darrol Poke, PT, DPT  06/21/2018, 3:33 PM

## 2018-06-22 ENCOUNTER — Inpatient Hospital Stay (HOSPITAL_COMMUNITY): Payer: Managed Care, Other (non HMO)

## 2018-06-22 ENCOUNTER — Inpatient Hospital Stay (HOSPITAL_COMMUNITY): Payer: Managed Care, Other (non HMO) | Admitting: Physical Therapy

## 2018-06-22 DIAGNOSIS — H53462 Homonymous bilateral field defects, left side: Secondary | ICD-10-CM

## 2018-06-22 LAB — GLUCOSE, CAPILLARY
GLUCOSE-CAPILLARY: 154 mg/dL — AB (ref 70–99)
GLUCOSE-CAPILLARY: 115 mg/dL — AB (ref 70–99)
Glucose-Capillary: 208 mg/dL — ABNORMAL HIGH (ref 70–99)
Glucose-Capillary: 132 mg/dL — ABNORMAL HIGH (ref 70–99)

## 2018-06-22 NOTE — Progress Notes (Signed)
Occupational Therapy Session Note  Patient Details  Name: Karl [name] MRN: [id number] Date of Birth: 12/08/1955  Today's Date: 06/22/2018 OT Individual Time: 0730-0830 Session 2: 10:15-1045 OT Individual Time Calculation (min): 60 min Session 2:  30 min   Short Term Goals: Week 1:  OT Short Term Goal 1 (Week 1): Pt will recall hemi dressing techniques with min questioning cues OT Short Term Goal 2 (Week 1): Pt will complete sit<>stand with no more than min A in preparation for BADL tasks OT Short Term Goal 3 (Week 1): Pt will complete toilet transfer with min A  Skilled Therapeutic Interventions/Progress Updates:    Pt sitting up in bed requesting assistance with setting up breakfast. Vc for bimanual use to place condiments on food and open containers, as well as for visual attention to L side of tray. Pt transitioned to EOB with use of bed features and (S), completing UB/LB bathing with set up/S. Vc for forced use of ataxic L UE. Despite over/undershooting, pt able to use L UE meaningfully throughout bathing. Pt completed 3x sit to stand from EOB with min A. Min A provided during standing level peri care for static balance. Pt completed squat pivot transfer to w/c with min A. Pt left sitting up in w/c with all needs met and chair alarm belt engaged.   Session 2: Pt received sitting up in w/c agreeable to therapy. Pt completed stand pivot transfer from w/c to mat with mod A, experiencing a LOB and requiring mod A to correct. Pt sat edge of mat and completed functional reaching task with a moving target with L UE to improve L UE ataxia/motor planning. Pt was then transported outside. Pt engaged in discussion re d/c planning and reflected on his past, building therapeutic rapport with OT. Pt was returned to his room and left sitting up in with with chair alarm belt engaged.   Therapy Documentation Precautions:  Precautions Precautions: Fall Precaution Comments: poor proprioceptive awareness  of Lt  Restrictions Weight Bearing Restrictions: No Vital Signs: Therapy Vitals Temp: 97.8 F (36.6 C) Temp Source: Oral Pulse Rate: 68 Resp: 17 BP: (!) 143/93 Patient Position (if appropriate): Lying Oxygen Therapy SpO2: 100 % O2 Device: Room Air Pain: Pain Assessment Pain Scale: 0-10 ADL: ADL ADL Comments: Please see functional navigator  See Function Navigator for Current Functional Status.   Therapy/Group: Individual Therapy  Curtis Sites 06/22/2018, 8:30 AM

## 2018-06-22 NOTE — Progress Notes (Signed)
ANTICOAGULATION CONSULT NOTE - Follow Up Consult  Pharmacy Consult for warfarin Indication: stroke  Allergies  Allergen Reactions  . Lipitor [Atorvastatin] Nausea And Vomiting    Patient Measurements: Height: 6\' 2"  (188 cm) Weight: 199 lb 11.8 oz (90.6 kg) IBW/kg (Calculated) : 82.2  Vital Signs: Temp: 97.8 F (36.6 C) (07/20 0516) Temp Source: Oral (07/20 0516) BP: 143/93 (07/20 0516) Pulse Rate: 68 (07/20 0516)  Labs: Recent Labs    06/20/18 0605 06/21/18 0546  LABPROT 25.7* 27.4*  INR 2.36 2.57    Estimated Creatinine Clearance: 69.2 mL/min (A) (by C-G formula based on SCr of 1.27 mg/dL (H)).  Assessment: 8 yoM on warfarin PTA for hx CVA- held on admission for a scheduled procedure, per report patient restart post-procedure, but INR was subtherapeutic on admission. 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 Pharmacy consulted for warfarin dosing. INR stable and therapeutic at 2.57 on 7/19. CBC stable and no documented bleeding.  Continue Warfarin 10 mg daily   Goal of Therapy:  INR 2-3  Monitor platelets by anticoagulation protocol: Yes   Plan:  Warfarin 10mg  PO daily  Monitor INR MWF as now therapeutic and stable on home dose, continue to monitor for s/sx bleeding  Thank you for the consult. Please contact with any questions.   Harlow Mares, PharmD PGY1 Pharmacy Resident Phone [phone]  06/22/2018   9:01 AM

## 2018-06-22 NOTE — Progress Notes (Signed)
Physical Therapy Session Note  Patient Details  Name: Karl [name] MRN: [id number] Date of Birth: 02-Dec-1956  Today's Date: 06/22/2018 PT Individual Time: 8022-3361 PT Individual Time Calculation (min): 54 min    Skilled Therapeutic Interventions/Progress Updates:    Session initiated with pt seated up in w/c.  Vitals:  Sp02: 100%; HR: 79 bpm; BP: 126/83.  Pt denies pain.  Session focused on improving right sided weight shift during functional activities.  Pt worked on sit to stand while reaching with R UE to facilitate R side trunk elongation and to decrease "push" over L.  Pt requires min assist with sit to stand on most occasions with substantial cues to weight shift over right. Pt ambulated 4 x approx 7 ft at ADL table to give external cue to shift weight over R LE (towards table).  Pt requires mod A and substantial cues for gait.  Without table for external focus of attention, pt ambulated approx 10 ft with mod A through B UE with manual cues for weight shift and verbal cues for step length.  Pt then performed chair to chair transfers on right to increase weight shift over R LE during function.  At 1150 nursing came to take vitals and blood sugar 208.  Pt propelled self back towards room and nursing gave insulin and requested pt go ahead and eat.  Session stopped at that time.  Pt left seated up in w/c in care of nursing.  Therapy Documentation Precautions:  Precautions Precautions: Fall Precaution Comments: poor proprioceptive awareness of Lt  Restrictions Weight Bearing Restrictions: No   See Function Navigator for Current Functional Status.   Therapy/Group: Individual Therapy  Tieara Flitton [name] 06/22/2018, 12:39 PM

## 2018-06-22 NOTE — Progress Notes (Signed)
Speech Language Pathology Daily Session Note  Patient Details  Name: Karl [name] MRN: [id number] Date of Birth: May 01, 1956  Today's Date: 06/22/2018 SLP Individual Time: 5945-8592 SLP Individual Time Calculation (min): 54 min  Short Term Goals: Week 1: SLP Short Term Goal 1 (Week 1): Patient will consume current diet with minimal overt s/s of aspiration or globus sensation with Mod I.  SLP Short Term Goal 2 (Week 1): Patient will demonstrate functional problem solving for mildly complex tasks with Min A verbal cues.  SLP Short Term Goal 3 (Week 1): Patient will recall new, daily information with supervision verbal cues.  SLP Short Term Goal 4 (Week 1): Patient will attend to left field of enviornment during functional tasks with supervision verbal cues.   Skilled Therapeutic Interventions: Skilled treatment session focused on cognitive goals. SLP facilitated session by providing Min-Mod A verbal cues for completing functional tasks targeting visual attention to the L field via scanning. Recalled grocery list of 5 items with 100% accuracy with Mod I. Completed basic, functional problem-solving for mildly complex mental math task with Mod A verbal and visual cues. He sustained adequate attention across therapeutic tasks and was engaged for the duration of the session. Patient left upright in bed with call bell and all needs within reach and alarm on. Continue with current plan of care.   Function: Cognition Comprehension Comprehension assist level: Understands basic 90% of the time/cues < 10% of the time  Expression   Expression assist level: Expresses basic 90% of the time/requires cueing < 10% of the time.  Social Interaction Social Interaction assist level: Interacts appropriately 90% of the time - Needs monitoring or encouragement for participation or interaction.  Problem Solving Problem solving assist level: Solves basic 75 - 89% of the time/requires cueing 10 - 24% of the time  Memory  Memory assist level: Recognizes or recalls 90% of the time/requires cueing < 10% of the time    Pain Pain Assessment Pain Scale: 0-10 Pain Score: 0-No pain  Therapy/Group: Individual Therapy  Hasan Douse A Saleem Coccia 06/22/2018, 4:30 PM

## 2018-06-22 NOTE — Progress Notes (Signed)
[place] PHYSICAL MEDICINE & REHABILITATION     PROGRESS NOTE    Subjective/Complaints: No new complaints today.  ROS: Patient denies fever, rash, sore throat, blurred vision, nausea, vomiting, diarrhea, cough, shortness of breath or chest pain, joint or back pain, headache, or mood change.    Objective:  No results found. No results for input(s): WBC, HGB, HCT, PLT in the last 72 hours. No results for input(s): NA, K, CL, GLUCOSE, BUN, CREATININE, CALCIUM in the last 72 hours.  Invalid input(s): CO CBG (last 3)  Recent Labs    06/21/18 1136 06/21/18 1654 06/21/18 2134  GLUCAP 176* 164* 133*    Wt Readings from Last 3 Encounters:  06/19/18 90.6 kg (199 lb 11.8 oz)  06/14/18 105.2 kg (232 lb)  06/11/18 105.2 kg (232 lb)     Intake/Output Summary (Last 24 hours) at 06/22/2018 1028 Last data filed at 06/22/2018 0527 Gross per 24 hour  Intake 360 ml  Output 1050 ml  Net -690 ml    Vital Signs: Blood pressure (!) 143/93, pulse 68, temperature 97.8 F (36.6 C), temperature source Oral, resp. rate 17, height 6\' 2"  (1.88 m), weight 90.6 kg (199 lb 11.8 oz), SpO2 100 %. Physical Exam:  Constitutional: No distress . Vital signs reviewed. HEENT: EOMI, oral membranes moist Neck: supple Cardiovascular: RRR without murmur. No JVD    Respiratory: CTA Bilaterally without wheezes or rales. Normal effort    GI: BS +, non-tender, non-distended  Musculoskeletal: Normal range of motion.  Neurological:  Left central 7. Speech intelligible, sl dysarthria. Motor 5/5 RUE and RLE. LUE 4 to 4+/5 prox to distal. LLE 4+/5.--stable motor exam.  No sensory abnl.  Normal insight and awareness Left visual field cut and neglect Psych: pleasant   Assessment/Plan: 1. Functional and mobility deficits secondary to bilateral posterior circulation infarcts which require 3+ hours per day of interdisciplinary therapy in a comprehensive inpatient rehab setting. Physiatrist is providing close team  supervision and 24 hour management of active medical problems listed below. Physiatrist and rehab team continue to assess barriers to discharge/monitor patient progress toward functional and medical goals.  Function:  Bathing Bathing position Bathing activity did not occur: N/A Position: Shower  Bathing parts Body parts bathed by patient: Right arm, Left arm, Chest, Abdomen, Front perineal area, Right upper leg, Left upper leg, Buttocks, Left lower leg, Right lower leg Body parts bathed by helper: Right lower leg, Left lower leg, Buttocks  Bathing assist Assist Level: Touching or steadying assistance(Pt > 75%)      Upper Body Dressing/Undressing Upper body dressing   What is the patient wearing?: Pull over shirt/dress     Pull over shirt/dress - Perfomed by patient: Thread/unthread right sleeve, Thread/unthread left sleeve, Put head through opening, Pull shirt over trunk          Upper body assist Assist Level: Set up, Supervision or verbal cues      Lower Body Dressing/Undressing Lower body dressing Lower body dressing/undressing activity did not occur: N/A What is the patient wearing?: Non-skid slipper socks, Pants, Underwear Underwear - Performed by patient: Thread/unthread left underwear leg, Pull underwear up/down, Thread/unthread right underwear leg   Pants- Performed by patient: Thread/unthread right pants leg, Thread/unthread left pants leg, Pull pants up/down Pants- Performed by helper: Fasten/unfasten pants Non-skid slipper socks- Performed by patient: Don/doff right sock Non-skid slipper socks- Performed by helper: Don/doff left sock, Don/doff right sock  Lower body assist Assist for lower body dressing: Touching or steadying assistance (Pt > 75%)      Toileting Toileting   Toileting steps completed by patient: Adjust clothing prior to toileting, Performs perineal hygiene, Adjust clothing after toileting Toileting steps completed by helper:  Performs perineal hygiene, Adjust clothing prior to toileting, Adjust clothing after toileting Toileting Assistive Devices: Grab bar or rail  Toileting assist Assist level: Touching or steadying assistance (Pt.75%)   Transfers Chair/bed transfer   Chair/bed transfer method: Stand pivot Chair/bed transfer assist level: Moderate assist (Pt 50 - 74%/lift or lower) Chair/bed transfer assistive device: Armrests     Locomotion Ambulation     Max distance: 15' Assist level: Moderate assist (Pt 50 - 74%)   Wheelchair   Type: Manual Max wheelchair distance: 100 Assist Level: Touching or steadying assistance (Pt > 75%)  Cognition Comprehension Comprehension assist level: Understands basic 90% of the time/cues < 10% of the time  Expression Expression assist level: Expresses basic 90% of the time/requires cueing < 10% of the time.  Social Interaction Social Interaction assist level: Interacts appropriately 90% of the time - Needs monitoring or encouragement for participation or interaction.  Problem Solving Problem solving assist level: Solves basic 75 - 89% of the time/requires cueing 10 - 24% of the time  Memory Memory assist level: Recognizes or recalls 90% of the time/requires cueing < 10% of the time   Medical Problem List and Plan:  1. 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 DVT Prophylaxis/Anticoagulation: Pharmaceutical: Coumadin INR 2.57 7/19 3. Pain Management: tylenol prn  4. Mood: LCSW to follow for evaluation and support.  5. Neuropsych: This patient is capable of making decisions on his own behalf.  6. Skin/Wound Care: routine pressure relief measures.  7. Fluids/Electrolytes/Nutrition: Monitor I/O. 8. CAD with ICM: Coumadin with INR goal 2-3.Marland Kitchen Continue coreg and Crestor.  9.T2DM: Resumed metformin at 500 mg  bid and titrate towards home dose of 1000mg  bid  -improving control with metformin. Increase to 850mg  bid CBG (last 3)  Recent Labs    06/21/18 1136 06/21/18 1654 06/21/18 2134  GLUCAP 176* 164* 133*  Controlled 06/22/2018 10. Hypokalemia: Improved. Will continue supplement for a couple more days.  11. HTN: Monitor BP bid--on coreg bid. Off Vasotec at this time to allow for adequate perfusion, permissive hypertension    Vitals:   06/21/18 1943 06/22/18 0516  BP: 122/79 (!) 143/93  Pulse: 71 68  Resp: 17 17  Temp: 98.2 F (36.8 C) 97.8 F (36.6 C)  SpO2: 100% 100%  Blood pressure in desired range at current time 12. TURP/BPH: ON Proscar and Flomax.   -foley removed with accetable PVR's  -off of keflex   LOS (Days) 4 A FACE TO FACE EVALUATION WAS PERFORMED  Erick Colace, MD 06/22/2018 10:28 AM

## 2018-06-23 ENCOUNTER — Inpatient Hospital Stay (HOSPITAL_COMMUNITY): Payer: Managed Care, Other (non HMO)

## 2018-06-23 LAB — GLUCOSE, CAPILLARY
GLUCOSE-CAPILLARY: 122 mg/dL — AB (ref 70–99)
GLUCOSE-CAPILLARY: 116 mg/dL — AB (ref 70–99)
GLUCOSE-CAPILLARY: 130 mg/dL — AB (ref 70–99)
Glucose-Capillary: 141 mg/dL — ABNORMAL HIGH (ref 70–99)

## 2018-06-23 NOTE — Progress Notes (Signed)
[place] PHYSICAL MEDICINE & REHABILITATION     PROGRESS NOTE    Subjective/Complaints: Patient slept well, no issues today  ROS: Patient deniesnausea, vomiting, diarrhea, cough, shortness of breath or chest pain, no bowel bladder issues  Objective:  No results found. No results for input(s): WBC, HGB, HCT, PLT in the last 72 hours. No results for input(s): NA, K, CL, GLUCOSE, BUN, CREATININE, CALCIUM in the last 72 hours.  Invalid input(s): CO CBG (last 3)  Recent Labs    06/22/18 1654 06/22/18 2126 06/23/18 0635  GLUCAP 132* 115* 141*    Wt Readings from Last 3 Encounters:  06/19/18 90.6 kg (199 lb 11.8 oz)  06/14/18 105.2 kg (232 lb)  06/11/18 105.2 kg (232 lb)     Intake/Output Summary (Last 24 hours) at 06/23/2018 1037 Last data filed at 06/23/2018 0755 Gross per 24 hour  Intake 720 ml  Output -  Net 720 ml    Vital Signs: Blood pressure 127/76, pulse 69, temperature 97.7 F (36.5 C), temperature source Oral, resp. rate 16, height 6\' 2"  (1.88 m), weight 90.6 kg (199 lb 11.8 oz), SpO2 99 %. Physical Exam:  Constitutional: No distress . Vital signs reviewed. HEENT: EOMI, oral membranes moist Neck: supple Cardiovascular: RRR without murmur. No JVD    Respiratory: CTA Bilaterally without wheezes or rales. Normal effort    GI: BS +, non-tender, non-distended  Musculoskeletal: Normal range of motion.  Neurological:  Left central 7. Speech intelligible, sl dysarthria. Motor 5/5 RUE and RLE. LUE 4 to 4+/5 prox to distal. LLE 4+/5.--stable motor exam.  No sensory abnl.  Normal insight and awareness Left visual field cut and neglect Psych: pleasant   Assessment/Plan: 1. Functional and mobility deficits secondary to bilateral posterior circulation infarcts which require 3+ hours per day of interdisciplinary therapy in a comprehensive inpatient rehab setting. Physiatrist is providing close team supervision and 24 hour management of active medical problems listed  below. Physiatrist and rehab team continue to assess barriers to discharge/monitor patient progress toward functional and medical goals.  Function:  Bathing Bathing position Bathing activity did not occur: N/A Position: Shower  Bathing parts Body parts bathed by patient: Right arm, Left arm, Chest, Abdomen, Front perineal area, Right upper leg, Left upper leg, Buttocks, Left lower leg, Right lower leg Body parts bathed by helper: Right lower leg, Left lower leg, Buttocks  Bathing assist Assist Level: Touching or steadying assistance(Pt > 75%)      Upper Body Dressing/Undressing Upper body dressing   What is the patient wearing?: Pull over shirt/dress     Pull over shirt/dress - Perfomed by patient: Thread/unthread right sleeve, Thread/unthread left sleeve, Put head through opening, Pull shirt over trunk          Upper body assist Assist Level: Set up, Supervision or verbal cues      Lower Body Dressing/Undressing Lower body dressing Lower body dressing/undressing activity did not occur: N/A What is the patient wearing?: Non-skid slipper socks, Pants, Underwear Underwear - Performed by patient: Thread/unthread left underwear leg, Pull underwear up/down, Thread/unthread right underwear leg   Pants- Performed by patient: Thread/unthread right pants leg, Thread/unthread left pants leg, Pull pants up/down Pants- Performed by helper: Fasten/unfasten pants Non-skid slipper socks- Performed by patient: Don/doff right sock Non-skid slipper socks- Performed by helper: Don/doff left sock, Don/doff right sock                  Lower body assist Assist for lower body dressing: Touching or  steadying assistance (Pt > 75%)      Toileting Toileting   Toileting steps completed by patient: Adjust clothing prior to toileting, Performs perineal hygiene, Adjust clothing after toileting Toileting steps completed by helper: Performs perineal hygiene, Adjust clothing prior to toileting, Adjust  clothing after toileting Toileting Assistive Devices: Grab bar or rail  Toileting assist Assist level: Touching or steadying assistance (Pt.75%)   Transfers Chair/bed transfer   Chair/bed transfer method: Stand pivot Chair/bed transfer assist level: Moderate assist (Pt 50 - 74%/lift or lower) Chair/bed transfer assistive device: Armrests     Locomotion Ambulation     Max distance: (10 ft) Assist level: Moderate assist (Pt 50 - 74%)   Wheelchair   Type: Manual Max wheelchair distance: (75 ft) Assist Level: Touching or steadying assistance (Pt > 75%)  Cognition Comprehension Comprehension assist level: Understands basic 90% of the time/cues < 10% of the time  Expression Expression assist level: Expresses basic 90% of the time/requires cueing < 10% of the time.  Social Interaction Social Interaction assist level: Interacts appropriately 90% of the time - Needs monitoring or encouragement for participation or interaction.  Problem Solving Problem solving assist level: Solves basic 75 - 89% of the time/requires cueing 10 - 24% of the time  Memory Memory assist level: Recognizes or recalls 90% of the time/requires cueing < 10% of the time   Medical Problem List and Plan:  1. 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 DVT Prophylaxis/Anticoagulation: Pharmaceutical: Coumadin INR as per pharmacy protocol 3. Pain Management: tylenol prn  4. Mood: LCSW to follow for evaluation and support.  5. Neuropsych: This patient is capable of making decisions on his own behalf.  6. Skin/Wound Care: routine pressure relief measures.  7. Fluids/Electrolytes/Nutrition: Monitor I/O. 8. CAD with ICM: Coumadin with INR goal 2-3.Marland Kitchen Continue coreg and Crestor.  9.T2DM: Resumed metformin at 500 mg bid and titrate towards home dose of 1000mg   bid  -improving control with metformin. Increase to 850mg  bid CBG (last 3)  Recent Labs    06/22/18 1654 06/22/18 2126 06/23/18 0635  GLUCAP 132* 115* 141*  Controlled 06/23/2018 10. Hypokalemia: Improved. Will continue supplement for a couple more days.  11. HTN: Monitor BP bid--on coreg bid. Off Vasotec at this time to allow for adequate perfusion, permissive hypertension    Vitals:   06/22/18 1950 06/23/18 0414  BP: 124/67 127/76  Pulse: 64 69  Resp: 17 16  Temp: 98.2 F (36.8 C) 97.7 F (36.5 C)  SpO2: 100% 99%  Blood pressure in desired range 06/23/2018 12. TURP/BPH: ON Proscar and Flomax.   -foley removed with accetable PVR's  -off of keflex   LOS (Days) 5 A FACE TO FACE EVALUATION WAS PERFORMED  Erick Colace, MD 06/23/2018 10:37 AM

## 2018-06-23 NOTE — Progress Notes (Signed)
Occupational Therapy Session Note  Patient Details  Name: Karl [name] MRN: [id number] Date of Birth: August 28, 1956  Today's Date: 06/23/2018 OT Individual Time: 1405-1500 OT Individual Time Calculation (min): 55 min    Short Term Goals: Week 1:  OT Short Term Goal 1 (Week 1): Pt will recall hemi dressing techniques with min questioning cues OT Short Term Goal 2 (Week 1): Pt will complete sit<>stand with no more than min A in preparation for BADL tasks OT Short Term Goal 3 (Week 1): Pt will complete toilet transfer with min A  Skilled Therapeutic Interventions/Progress Updates:    1:1. No c/o pain. Pt greeted with RN in room requesting to use urinal. Pt sit to stand at EOB no AD with MOD A for lifting and VC for use of bed rail to steady self as RN positions urinal. Pt completes stand pivot transfer EOB<>w/c with min A and Vc for closed chain use of bed rail/hand placement. Pt bathes/dresses at sit to stand level with VC for midline orientation when standing and use of mirror for visual feedback/upright posture. Pt requires increased time to locate ADL items on L visual field and encouragement to reach for objects with LUE/forced use of LUE while bathing. Pt requires VC during dressing for clothing orientation, one handed sock donning and hemi techniques when donning pants. Exited session with pt seated in bed, exit alarm on and call light in reach  Therapy Documentation Precautions:  Precautions Precautions: Fall Precaution Comments: poor proprioceptive awareness of Lt  Restrictions Weight Bearing Restrictions: No General:  See Function Navigator for Current Functional Status.   Therapy/Group: Individual Therapy  Shon Hale 06/23/2018, 2:56 PM

## 2018-06-24 ENCOUNTER — Inpatient Hospital Stay (HOSPITAL_COMMUNITY): Payer: Managed Care, Other (non HMO) | Admitting: Speech Pathology

## 2018-06-24 ENCOUNTER — Inpatient Hospital Stay (HOSPITAL_COMMUNITY): Payer: Managed Care, Other (non HMO) | Admitting: Occupational Therapy

## 2018-06-24 ENCOUNTER — Inpatient Hospital Stay (HOSPITAL_COMMUNITY): Payer: Managed Care, Other (non HMO) | Admitting: Physical Therapy

## 2018-06-24 DIAGNOSIS — E876 Hypokalemia: Secondary | ICD-10-CM

## 2018-06-24 DIAGNOSIS — R791 Abnormal coagulation profile: Secondary | ICD-10-CM

## 2018-06-24 DIAGNOSIS — E871 Hypo-osmolality and hyponatremia: Secondary | ICD-10-CM

## 2018-06-24 LAB — PROTIME-INR
INR: 3.04
Prothrombin Time: 31.2 seconds — ABNORMAL HIGH (ref 11.4–15.2)

## 2018-06-24 LAB — GLUCOSE, CAPILLARY
Glucose-Capillary: 130 mg/dL — ABNORMAL HIGH (ref 70–99)
Glucose-Capillary: 153 mg/dL — ABNORMAL HIGH (ref 70–99)
Glucose-Capillary: 109 mg/dL — ABNORMAL HIGH (ref 70–99)
Glucose-Capillary: 162 mg/dL — ABNORMAL HIGH (ref 70–99)

## 2018-06-24 MED ORDER — WARFARIN SODIUM 7.5 MG PO TABS
7.5000 mg | ORAL_TABLET | Freq: Once | ORAL | Status: AC
  Administered 2018-06-24: 7.5 mg via ORAL
  Filled 2018-06-24: qty 1

## 2018-06-24 NOTE — Progress Notes (Signed)
ANTICOAGULATION CONSULT NOTE - Follow Up Consult  Pharmacy Consult for warfarin Indication: stroke  Allergies  Allergen Reactions   Lipitor [Atorvastatin] Nausea And Vomiting    Patient Measurements: Height: 6\' 2"  (188 cm) Weight: 199 lb 11.8 oz (90.6 kg) IBW/kg (Calculated) : 82.2  Vital Signs: Temp: 97.8 F (36.6 C) (07/22 0433) Temp Source: Oral (07/22 0433) BP: 141/85 (07/22 0433) Pulse Rate: 71 (07/22 0433)  Labs: Recent Labs    06/24/18 0633  LABPROT 31.2*  INR 3.04    Estimated Creatinine Clearance: 69.2 mL/min (A) (by C-G formula based on SCr of 1.27 mg/dL (H)).  Assessment: 62 YO M on warfarin PTA for hx CVA- held on admission for a scheduled procedure, per report patient restart post-procedure, but INR was subtherapeutic on admission. Pt presented to [facility] ED with RLE numbness, dizziness, and speech deficits and noted to have left chronic cerebellar cva and acute occipital lobe CVA. Pharmacy consulted for warfarin dosing.  INR supratherapeutic today at 3.04. Last CBC stable, pt scheduled to get CBC once weekly (due tomorrow 7/23). No s/sx of bleeding reported.  Goal of Therapy:  INR 2-3  Monitor platelets by anticoagulation protocol: Yes   Plan:  Warfarin 7.5mg  PO x1 Monitor INR daily until back within therapeutic range  Continue to monitor for s/sx bleeding  Thank you for the consult. Please contact with any questions.   Lenward Chancellor, PharmD PGY1 Pharmacy Resident Phone [phone] 06/24/2018 8:41 AM

## 2018-06-24 NOTE — Progress Notes (Signed)
[place] PHYSICAL MEDICINE & REHABILITATION     PROGRESS NOTE    Subjective/Complaints: Patient seen lying in bed this morning. He states is going well overnight. He states he has a good weekend. He states that he feels that his left-sided getting stronger.  ROS: denies CP, SOB, vomiting, diarrhea.  Objective:  No results found. No results for input(s): WBC, HGB, HCT, PLT in the last 72 hours. No results for input(s): NA, K, CL, GLUCOSE, BUN, CREATININE, CALCIUM in the last 72 hours.  Invalid input(s): CO CBG (last 3)  Recent Labs    06/23/18 1631 06/23/18 2215 06/24/18 0710  GLUCAP 116* 130* 130*    Wt Readings from Last 3 Encounters:  06/19/18 90.6 kg (199 lb 11.8 oz)  06/14/18 105.2 kg (232 lb)  06/11/18 105.2 kg (232 lb)     Intake/Output Summary (Last 24 hours) at 06/24/2018 0840 Last data filed at 06/23/2018 1143 Gross per 24 hour  Intake -  Output 1 ml  Net -1 ml    Vital Signs: Blood pressure (!) 141/85, pulse 71, temperature 97.8 F (36.6 C), temperature source Oral, resp. rate 18, height 6\' 2"  (1.88 m), weight 90.6 kg (199 lb 11.8 oz), SpO2 99 %. Physical Exam:  Constitutional: No distress . Vital signs reviewed. HENT: Normocephalic.  Atraumatic. Eyes: EOMI. No discharge. Cardiovascular: RRR. No JVD. Respiratory: CTA Bilaterally. Normal effort. GI: BS +. Non-distended. Musc: No edema or tenderness in extremities. Neurological:  Left central 7.  Mild dysarthria Motor:  5/5 RUE/RLE.  LUE 4-4+/5 prox to distal.  LLE 4+/5. Psych: normal mood. Normal affect.   Assessment/Plan: 1. Functional and mobility deficits secondary to bilateral posterior circulation infarcts which require 3+ hours per day of interdisciplinary therapy in a comprehensive inpatient rehab setting. Physiatrist is providing close team supervision and 24 hour management of active medical problems listed below. Physiatrist and rehab team continue to assess barriers to  discharge/monitor patient progress toward functional and medical goals.  Function:  Bathing Bathing position Bathing activity did not occur: N/A Position: Shower  Bathing parts Body parts bathed by patient: Right arm, Left arm, Chest, Abdomen, Front perineal area, Right upper leg, Left upper leg, Buttocks, Left lower leg, Right lower leg Body parts bathed by helper: Right lower leg, Left lower leg, Buttocks  Bathing assist Assist Level: Touching or steadying assistance(Pt > 75%)      Upper Body Dressing/Undressing Upper body dressing   What is the patient wearing?: Pull over shirt/dress     Pull over shirt/dress - Perfomed by patient: Thread/unthread right sleeve, Thread/unthread left sleeve, Put head through opening, Pull shirt over trunk          Upper body assist Assist Level: Set up, Supervision or verbal cues      Lower Body Dressing/Undressing Lower body dressing Lower body dressing/undressing activity did not occur: N/A What is the patient wearing?: Non-skid slipper socks, Pants, Underwear Underwear - Performed by patient: Thread/unthread left underwear leg, Pull underwear up/down, Thread/unthread right underwear leg   Pants- Performed by patient: Thread/unthread right pants leg, Thread/unthread left pants leg, Pull pants up/down Pants- Performed by helper: Fasten/unfasten pants Non-skid slipper socks- Performed by patient: Don/doff right sock Non-skid slipper socks- Performed by helper: Don/doff left sock, Don/doff right sock                  Lower body assist Assist for lower body dressing: Touching or steadying assistance (Pt > 75%)      Toileting Toileting  Toileting steps completed by patient: Adjust clothing prior to toileting, Performs perineal hygiene, Adjust clothing after toileting Toileting steps completed by helper: Performs perineal hygiene, Adjust clothing prior to toileting, Adjust clothing after toileting Toileting Assistive Devices: Grab bar  or rail  Toileting assist Assist level: Touching or steadying assistance (Pt.75%)   Transfers Chair/bed transfer   Chair/bed transfer method: Stand pivot Chair/bed transfer assist level: Moderate assist (Pt 50 - 74%/lift or lower) Chair/bed transfer assistive device: Armrests     Locomotion Ambulation     Max distance: (10 ft) Assist level: Moderate assist (Pt 50 - 74%)   Wheelchair   Type: Manual Max wheelchair distance: (75 ft) Assist Level: Touching or steadying assistance (Pt > 75%)  Cognition Comprehension Comprehension assist level: Understands basic 90% of the time/cues < 10% of the time  Expression Expression assist level: Expresses basic 90% of the time/requires cueing < 10% of the time.  Social Interaction Social Interaction assist level: Interacts appropriately 90% of the time - Needs monitoring or encouragement for participation or interaction.  Problem Solving Problem solving assist level: Solves basic 75 - 89% of the time/requires cueing 10 - 24% of the time  Memory Memory assist level: Recognizes or recalls 90% of the time/requires cueing < 10% of the time   Medical Problem List and Plan:  1. 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 DVT Prophylaxis/Anticoagulation: Pharmaceutical: Coumadin   INR supratherapeutic on 7/22 3. Pain Management: tylenol prn  4. Mood: LCSW to follow for evaluation and support.  5. Neuropsych: This patient is capable of making decisions on his own behalf.  6. Skin/Wound Care: routine pressure relief measures.  7. Fluids/Electrolytes/Nutrition: Monitor I/O. 8. CAD with ICM: Coumadin with INR goal 2-3.Marland Kitchen Continue coreg and Crestor.  9.T2DM: Metformin increased to 850mg  bid (PTA 1000 twice a day) CBG (last 3)  Recent Labs     06/23/18 1631 06/23/18 2215 06/24/18 0710  GLUCAP 116* 130* 130*   Elevated, but improving on 7/22 10. Hypokalemia:   Improved.   Labs ordered for tomorrow 11. HTN: Monitor BP bid--on coreg bid. Off Vasotec at this time    Vitals:   06/23/18 2048 06/24/18 0433  BP: 127/80 (!) 141/85  Pulse: 68 71  Resp: 18 18  Temp: 98.5 F (36.9 C) 97.8 F (36.6 C)  SpO2: 99% 99%   Relatively controlled on 7/22 12. TURP/BPH: ON Proscar and Flomax.   -foley removed with accetable PVR's  -off of keflex 13. Hyponatremia  Sodium 132 on 7/17  Labs ordered for tomorrow   LOS (Days) 6 A FACE TO FACE EVALUATION WAS PERFORMED  Richards Pherigo Karis Juba, MD 06/24/2018 8:40 AM

## 2018-06-24 NOTE — Progress Notes (Signed)
Occupational Therapy Session Note  Patient Details  Name: Karl [name] MRN: [id number] Date of Birth: [birth year]  Today's Date: 06/24/2018 OT Individual Time: 1219-7588 OT Individual Time Calculation (min): 25 min  and Today's Date: 06/24/2018 OT Missed Time: 35 Minutes Missed Time Reason: Patient fatigue   Short Term Goals: Week 1:  OT Short Term Goal 1 (Week 1): Pt will recall hemi dressing techniques with min questioning cues OT Short Term Goal 2 (Week 1): Pt will complete sit<>stand with no more than min A in preparation for BADL tasks OT Short Term Goal 3 (Week 1): Pt will complete toilet transfer with min A  Skilled Therapeutic Interventions/Progress Updates:    Upon entering the room, pt supine in bed and sleeping soundly. Pt verbalized, " I don't have anymore therapy. I'm tired." OT reviewed schedule with pt and encouraged him to participate in session. Pt's bladder scanned by RN as well and pt needing to have I&O cath if unable to void. Pt agreeable to attempt toileting with therapist. Stand pivot transfer from bed >wheelchair with mod A. OT assisted pt into bathroom and pt required max A stand pivot transfer onto toilet for lifting and lowering assistance. Pt with min A standing balance while therapist assisted with LB clothing management. Pt able to void with increased time. Pt returning to wheelchair in same manner as above with mod multimodal cues for weight shift and hand position for transfer. Pt declined further OT intervention and OT assisted pt back to bed. Call bell within reach and bed alarm set.   Therapy Documentation Precautions:  Precautions Precautions: Fall Precaution Comments: poor proprioceptive awareness of Lt  Restrictions Weight Bearing Restrictions: No General: General OT Amount of Missed Time: 35 Minutes Vital Signs: Therapy Vitals Temp: 98.5 F (36.9 C) Temp Source: Oral Pulse Rate: 70 Resp: 17 BP: 137/78 Patient Position (if appropriate):  Lying Oxygen Therapy SpO2: 100 % O2 Device: Room Air ADL: ADL ADL Comments: Please see functional navigator  See Function Navigator for Current Functional Status.   Therapy/Group: Individual Therapy  Alen Bleacher 06/24/2018, 3:53 PM

## 2018-06-24 NOTE — Progress Notes (Signed)
Speech Language Pathology Daily Session Note  Patient Details  Name: Karl [name] MRN: [id number] Date of Birth: 06-19-1956  Today's Date: 06/24/2018 SLP Individual Time: 1779-3903 SLP Individual Time Calculation (min): 55 min  Short Term Goals: Week 1: SLP Short Term Goal 1 (Week 1): Patient will consume current diet with minimal overt s/s of aspiration or globus sensation with Mod I.  SLP Short Term Goal 2 (Week 1): Patient will demonstrate functional problem solving for mildly complex tasks with Min A verbal cues.  SLP Short Term Goal 3 (Week 1): Patient will recall new, daily information with supervision verbal cues.  SLP Short Term Goal 4 (Week 1): Patient will attend to left field of enviornment during functional tasks with supervision verbal cues.   Skilled Therapeutic Interventions: Skilled treatment session focused on cognitive goals. SLP facilitated session by providing extra time and supervision verbal cues for patient to complete task of organizing a BID pill box. Patient attended to left field of environment throughout task with Mod I. Patient independently requested to use the bathroom and directed his care in regards to use of the [facility] for safety. Patient able to void. Patient left upright in bed with alarm on, all needs within reach and family present. Continue with current plan of care.      Function:  Cognition Comprehension Comprehension assist level: Understands basic 90% of the time/cues < 10% of the time  Expression   Expression assist level: Expresses basic 90% of the time/requires cueing < 10% of the time.  Social Interaction Social Interaction assist level: Interacts appropriately 90% of the time - Needs monitoring or encouragement for participation or interaction.  Problem Solving Problem solving assist level: Solves basic 75 - 89% of the time/requires cueing 10 - 24% of the time  Memory Memory assist level: Recognizes or recalls 90% of the time/requires cueing <  10% of the time    Pain Pain Assessment Pain Scale: 0-10 Pain Score: 0-No pain  Therapy/Group: Individual Therapy  Derril Franek 06/24/2018, 11:17 AM

## 2018-06-24 NOTE — Progress Notes (Signed)
Physical Therapy Session Note  Patient Details  Name: Karl [name] MRN: [id number] Date of Birth: 06-Aug-1956  Today's Date: 06/24/2018 PT Individual Time: 1594-5859 PT Individual Time Calculation (min): 68 min   Short Term Goals: Week 1:  PT Short Term Goal 1 (Week 1): pt will perform bed mobility with supervision assist  PT Short Term Goal 2 (Week 1): Pt will perform bed<>WC transfers with min assist  PT Short Term Goal 3 (Week 1): Pt will maintain standing balance with min assist x 2 min   PT Short Term Goal 4 (Week 1): Pt will ambulate 38ft with min assist and LRAD  PT Short Term Goal 5 (Week 1): Pt will attend to L side 58ft of the time with min cues durring functional tasks.   Skilled Therapeutic Interventions/Progress Updates:   pt on toilet on PT arrival, completed sit to stand from toilet with stedy and min A, total A for hygiene. Squat pivot transfers performed throughout session with min A, cues for sequencing and technique, cues for LE placement, manual facilitation for pivot.  Gait training with Lt UE around PTs shoulders with pt able to perform with mod A 2 x 25'.  Manual facilitation for wt shifts and Lt hip anterior translation.  Standing reciprocal taps with focus on wt shifts and upright posture with pt requiring mod A due to Lt lean and Lt knee flexion when fatigued, pt requires visual and tactile cues to improve posture to midline.  Standing reaching task to Rt to improve Lt hip and knee strengthening with min/mod A.  nustep for reciprocal movement and mm endurance x 8 minutes level 5.  Pt left in bed with needs at hand, alarm set.   Therapy Documentation Precautions:  Precautions Precautions: Fall Precaution Comments: poor proprioceptive awareness of Lt  Restrictions Weight Bearing Restrictions: No Pain: Pain Assessment Pain Scale: 0-10 Pain Score: 0-No pain   Therapy/Group: Individual Therapy  Starlit Raburn 06/24/2018, 2:43 PM

## 2018-06-25 ENCOUNTER — Inpatient Hospital Stay (HOSPITAL_COMMUNITY): Payer: Managed Care, Other (non HMO) | Admitting: Occupational Therapy

## 2018-06-25 ENCOUNTER — Inpatient Hospital Stay (HOSPITAL_COMMUNITY): Payer: Managed Care, Other (non HMO)

## 2018-06-25 ENCOUNTER — Inpatient Hospital Stay (HOSPITAL_COMMUNITY): Payer: Managed Care, Other (non HMO) | Admitting: Speech Pathology

## 2018-06-25 ENCOUNTER — Inpatient Hospital Stay (HOSPITAL_COMMUNITY): Payer: Managed Care, Other (non HMO) | Admitting: Physical Therapy

## 2018-06-25 DIAGNOSIS — R7309 Other abnormal glucose: Secondary | ICD-10-CM

## 2018-06-25 LAB — CBC
HEMATOCRIT: 43.5 % (ref 39.0–52.0)
Hemoglobin: 13.8 g/dL (ref 13.0–17.0)
MCH: 24.1 pg — ABNORMAL LOW (ref 26.0–34.0)
MCHC: 31.7 g/dL (ref 30.0–36.0)
MCV: 76 fL — AB (ref 78.0–100.0)
Platelets: 433 10*3/uL — ABNORMAL HIGH (ref 150–400)
RBC: 5.72 MIL/uL (ref 4.22–5.81)
RDW: 13.9 % (ref 11.5–15.5)
WBC: 8.6 10*3/uL (ref 4.0–10.5)

## 2018-06-25 LAB — URINALYSIS, COMPLETE (UACMP) WITH MICROSCOPIC
BILIRUBIN URINE: NEGATIVE
Glucose, UA: NEGATIVE mg/dL
Ketones, ur: NEGATIVE mg/dL
Nitrite: POSITIVE — AB
Protein, ur: 100 mg/dL — AB
SPECIFIC GRAVITY, URINE: 1.016 (ref 1.005–1.030)
WBC, UA: >50 WBC/hpf — ABNORMAL HIGH (ref 0–5)
pH: 5 (ref 5.0–8.0)

## 2018-06-25 LAB — BASIC METABOLIC PANEL
Anion gap: 12 (ref 5–15)
BUN: 12 mg/dL (ref 8–23)
CHLORIDE: 99 mmol/L (ref 98–111)
CO2: 24 mmol/L (ref 22–32)
Calcium: 9.3 mg/dL (ref 8.9–10.3)
Creatinine, Ser: 1.05 mg/dL (ref 0.61–1.24)
GFR calc Af Amer: >60 mL/min (ref >60)
GFR calc non Af Amer: >60 mL/min (ref >60)
GLUCOSE: 149 mg/dL — AB (ref 70–99)
POTASSIUM: 4.3 mmol/L (ref 3.5–5.1)
SODIUM: 135 mmol/L (ref 135–145)

## 2018-06-25 LAB — GLUCOSE, CAPILLARY
GLUCOSE-CAPILLARY: 158 mg/dL — AB (ref 70–99)
Glucose-Capillary: 120 mg/dL — ABNORMAL HIGH (ref 70–99)
Glucose-Capillary: 115 mg/dL — ABNORMAL HIGH (ref 70–99)
Glucose-Capillary: 163 mg/dL — ABNORMAL HIGH (ref 70–99)

## 2018-06-25 LAB — PROTIME-INR
INR: 2.88
Prothrombin Time: 29.9 seconds — ABNORMAL HIGH (ref 11.4–15.2)

## 2018-06-25 MED ORDER — WARFARIN SODIUM 10 MG PO TABS
10.0000 mg | ORAL_TABLET | Freq: Once | ORAL | Status: AC
  Administered 2018-06-25: 10 mg via ORAL
  Filled 2018-06-25: qty 1

## 2018-06-25 NOTE — Patient Care Conference (Signed)
Inpatient RehabilitationTeam Conference and Plan of Care Update Date: 06/25/2018   Time: 10:30 AM    Patient Name: Karl [name]      Medical Record Number: [id number]  Date of Birth: 06-25-1956 Sex: Male         Room/Bed: 4W03C/4W03C-01 Payor Info: Payor: [employer] / Plan: CIGNA MANAGED / Product Type: *No Product type* /    Admitting Diagnosis: Bilateal CVA  Admit Date/Time:  06/18/2018  6:14 PM Admission Comments: No comment available   Primary Diagnosis:  Acute ischemic right PCA stroke (HCC) Principal Problem: Acute ischemic right PCA stroke [facility])  Patient Active Problem List   Diagnosis Date Noted  . Labile blood glucose   . Hyponatremia   . Hypokalemia   . Supratherapeutic INR   . Acute ischemic right PCA stroke (HCC) 06/18/2018  . Ischemic cardiomyopathy   . CVA (cerebral vascular accident) (HCC) 06/14/2018  . Urinary retention 06/05/2018  . Encounter for therapeutic drug monitoring 02/08/2018  . History of CVA (cerebrovascular accident) 12/20/2017  . Coronary artery disease 12/19/2017  . Mild renal insufficiency 12/19/2017  . TIA (transient ischemic attack) 12/19/2017  . Hip flexor tendinitis 05/22/2017  . Abscess of back 05/22/2017  . Pain of both hip joints 04/25/2017  . Dyslipidemia 09/01/2015  . BPH without urinary obstruction 09/01/2015  . DM (diabetes mellitus), type 2, uncontrolled, periph vascular complic (HCC) 07/10/2014  . Essential hypertension 04/20/2009  . CARDIOMYOPATHY, ISCHEMIC 04/20/2009  . LEFT BUNDLE BRANCH BLOCK 04/20/2009  . Chronic systolic heart failure (HCC) 04/20/2009    Expected Discharge Date: Expected Discharge Date: 07/09/18  Team Members Present: Physician leading conference: Dr. Maryla Morrow Social Worker Present: Staci Acosta, LCSW Nurse Present: Allayne Stack, RN PT Present: Judieth Keens, PT OT Present: Kearney Hard, OT SLP Present: Feliberto Gottron, SLP PPS Coordinator present : Tora Duck, RN, CRRN     Current  Status/Progress Goal Weekly Team Focus  Medical   Functional deficits secondary to bilateral posterior circulation, right posterior cerebral artery right superior cerebellar artery embolic infarcts   Improve mobility, DM/HTN, cognition, hyponatremia  See above   Bowel/Bladder   continent of Bowel & bladder, LBM 7/22  remain continent  continue to monitor & assist as needed   Swallow/Nutrition/ Hydration   Dys. 2 textures (per his preference) with thin liquids, Mod I  Mod I  Tolerance and continued attempts to encourage trials of upgraded textures   ADL's   Min/Mod A BADL tasks, Min A functional transfers  Supervision overall  functional transfers, standing balance/endurance, body awareness, attention, NMR, modified bathing/dressing   Mobility   mod A transfers, mod/max A gait short distances  min A overall  NMR, balance, gait   Communication             Safety/Cognition/ Behavioral Observations  Min A  Supervision  complex problem solving and recall    Pain   no c/o pain  pain scale <3/10  assess & treat as needed   Skin   dry skin, intact  no new areas of skin break down  assess q shift    Rehab Goals Patient on target to meet rehab goals: Yes Rehab Goals Revised: none *See Care Plan and progress notes for long and short-term goals.     Barriers to Discharge  Current Status/Progress Possible Resolutions Date Resolved   Physician    Medical stability     See above  Therapies, optimize DM/HTN meds, follow labs      Nursing  PT                    OT                  SLP                SW                Discharge Planning/Teaching Needs:  Pt plans to go home with his dtr who will provide 24/7 assistance, as well as other family members to assist.  Dtr will receive family education closer to d/c.   Team Discussion:  Pt with up and down blood sugars and blood pressure that are relatively controlled now with medication adjustments.  Dr. Allena Katz will  continue to watch these and pt's sodium.  Pt vomited last night and this morning per RN and is nauseated.  Pain is controlled and he is continent of bowel and bladder.  Pt is using regular sugar per ST and he is on a D2 diet due to his preference because he feels he is having swallowing issues.  ST stated that pt's swallowing is normal, but he self limits.  ST hopes to upgrade diet after showing him his test.  Pt's cognition is improving and he can do things with extra time and encouragement.  PT is trying a RW with pt today.  He is min A with transfers, min/mod A with gait with supervision goals/min A for gait.  Pt fatigues with OT for ADLs and then needs more help.  Has left inattention.  OT goals are supervision.  Revisions to Treatment Plan:  none    Continued Need for Acute Rehabilitation Level of Care: The patient requires daily medical management by a physician with specialized training in physical medicine and rehabilitation for the following conditions: Daily direction of a multidisciplinary physical rehabilitation program to ensure safe treatment while eliciting the highest outcome that is of practical value to the patient.: Yes Daily medical management of patient stability for increased activity during participation in an intensive rehabilitation regime.: Yes Daily analysis of laboratory values and/or radiology reports with any subsequent need for medication adjustment of medical intervention for : Diabetes problems;Blood pressure problems;Neurological problems;Other  Karl [name], [name] 06/25/2018, 11:44 AM

## 2018-06-25 NOTE — Progress Notes (Signed)
Occupational Therapy Session Note  Patient Details  Name: Karl [name] MRN: [id number] Date of Birth: 1956/06/07  Today's Date: 06/25/2018 OT Individual Time: 1100-1159 OT Individual Time Calculation (min): 59 min    Short Term Goals: Week 1:  OT Short Term Goal 1 (Week 1): Pt will recall hemi dressing techniques with min questioning cues OT Short Term Goal 2 (Week 1): Pt will complete sit<>stand with no more than min A in preparation for BADL tasks OT Short Term Goal 3 (Week 1): Pt will complete toilet transfer with min A  Skilled Therapeutic Interventions/Progress Updates:    Upon entering the room, pt seated in wheelchair awaiting OT arrival. Pt with no c/o pain this session. Pt propelled wheelchair 100' with B UEs and supervision and min verbal cues for safety. Pt engaged in L hand [facility] and strengthening tasks with theraputty HEP. Pt utilized yellow resistive putty with min verbal cuing for proper technique. Pt taking multiple rest breaks secondary to hand fatigue. Pt performed kinetron with B LEs for resistive exercise with 3 bouts of 2 minutes. Pt needing rest breaks between each bout secondary to fatigue. Pt propelled wheelchair back to room in same manner and performed stand pivot transfer from wheelchair >toilet with mod lifting assistance and use of grab bar. Pt needing mod multimodal cuing for sequencing , hand, and foot placement. Pt able to push clothing down with min A for standing balance. Pt able to void and returning back to wheelchair in same manner. Pt performed hand hygiene with set up A at sink from seated position and then transferring into bed secondary to fatigue. Lunch tray set up and call bell within reach. Bed alarm activated.   Therapy Documentation Precautions:  Precautions Precautions: Fall Precaution Comments: poor proprioceptive awareness of Lt  Restrictions Weight Bearing Restrictions: No  Pain: Pain Assessment Pain Scale: 0-10 Pain Score: 0-No  pain ADL: ADL ADL Comments: Please see functional navigator  See Function Navigator for Current Functional Status.   Therapy/Group: Individual Therapy  Alen Bleacher 06/25/2018, 12:04 PM

## 2018-06-25 NOTE — Progress Notes (Signed)
[place] PHYSICAL MEDICINE & REHABILITATION     PROGRESS NOTE    Subjective/Complaints: Patient seen sitting up in bed this morning about to eat breakfast. He states he slept well overnight. Patient reminded by SLP that he had nausea and vomiting overnight, but he states he feels better this morning. He does note that he had a bowel movement yesterday morning.  ROS:  Denies CP, SOB, vomiting, diarrhea.  Objective:  No results found. Recent Labs    06/25/18 0425  WBC 8.6  HGB 13.8  HCT 43.5  PLT 433*   Recent Labs    06/25/18 0425  NA 135  K 4.3  CL 99  GLUCOSE 149*  BUN 12  CREATININE 1.05  CALCIUM 9.3   CBG (last 3)  Recent Labs    06/24/18 1641 06/24/18 2146 06/25/18 0703  GLUCAP 109* 162* 120*    Wt Readings from Last 3 Encounters:  06/19/18 90.6 kg (199 lb 11.8 oz)  06/14/18 105.2 kg (232 lb)  06/11/18 105.2 kg (232 lb)     Intake/Output Summary (Last 24 hours) at 06/25/2018 0831 Last data filed at 06/24/2018 1235 Gross per 24 hour  Intake 360 ml  Output 150 ml  Net 210 ml    Vital Signs: Blood pressure (!) 155/94, pulse 92, temperature 98 F (36.7 C), temperature source Oral, resp. rate 18, height 6\' 2"  (1.88 m), weight 90.6 kg (199 lb 11.8 oz), SpO2 99 %. Physical Exam:  Constitutional: No distress . Vital signs reviewed. HENT: Normocephalic.  Atraumatic. Eyes: EOMI. No discharge. Cardiovascular: RRR. No JVD. Respiratory: CTA bilaterally. Normal effort. GI: BS +. Non-distended. Musc: No edema or tenderness in extremities. Neurological:  Left central 7.  Mild dysarthria Motor:  5/5 RUE/RLE.  LUE 4-4+/5 prox to distal.  LLE 4+/5. Psych: normal mood. Normal affect.   Assessment/Plan: 1. Functional and mobility deficits secondary to bilateral posterior circulation infarcts which require 3+ hours per day of interdisciplinary therapy in a comprehensive inpatient rehab setting. Physiatrist is providing close team supervision and 24 hour  management of active medical problems listed below. Physiatrist and rehab team continue to assess barriers to discharge/monitor patient progress toward functional and medical goals.  Function:  Bathing Bathing position Bathing activity did not occur: N/A Position: Shower  Bathing parts Body parts bathed by patient: Right arm, Left arm, Chest, Abdomen, Front perineal area, Right upper leg, Left upper leg, Buttocks, Left lower leg, Right lower leg Body parts bathed by helper: Right lower leg, Left lower leg, Buttocks  Bathing assist Assist Level: Touching or steadying assistance(Pt > 75%)      Upper Body Dressing/Undressing Upper body dressing   What is the patient wearing?: Pull over shirt/dress     Pull over shirt/dress - Perfomed by patient: Thread/unthread right sleeve, Thread/unthread left sleeve, Put head through opening, Pull shirt over trunk          Upper body assist Assist Level: Set up, Supervision or verbal cues      Lower Body Dressing/Undressing Lower body dressing Lower body dressing/undressing activity did not occur: N/A What is the patient wearing?: Non-skid slipper socks, Pants, Underwear Underwear - Performed by patient: Thread/unthread left underwear leg, Pull underwear up/down, Thread/unthread right underwear leg   Pants- Performed by patient: Thread/unthread right pants leg, Thread/unthread left pants leg, Pull pants up/down Pants- Performed by helper: Fasten/unfasten pants Non-skid slipper socks- Performed by patient: Don/doff right sock Non-skid slipper socks- Performed by helper: Don/doff left sock, Don/doff right sock  Lower body assist Assist for lower body dressing: Touching or steadying assistance (Pt > 75%)      Toileting Toileting   Toileting steps completed by patient: Adjust clothing prior to toileting, Performs perineal hygiene, Adjust clothing after toileting Toileting steps completed by helper: Performs perineal  hygiene, Adjust clothing prior to toileting, Adjust clothing after toileting Toileting Assistive Devices: Grab bar or rail  Toileting assist Assist level: (total A)   Transfers Chair/bed transfer   Chair/bed transfer method: Stand pivot Chair/bed transfer assist level: Moderate assist (Pt 50 - 74%/lift or lower) Chair/bed transfer assistive device: Armrests     Locomotion Ambulation     Max distance: (10 ft) Assist level: Moderate assist (Pt 50 - 74%)   Wheelchair   Type: Manual Max wheelchair distance: (75 ft) Assist Level: Touching or steadying assistance (Pt > 75%)  Cognition Comprehension Comprehension assist level: Understands basic 90% of the time/cues < 10% of the time  Expression Expression assist level: Expresses basic 90% of the time/requires cueing < 10% of the time.  Social Interaction Social Interaction assist level: Interacts appropriately 90% of the time - Needs monitoring or encouragement for participation or interaction.  Problem Solving Problem solving assist level: Solves basic 75 - 89% of the time/requires cueing 10 - 24% of the time  Memory Memory assist level: Recognizes or recalls 90% of the time/requires cueing < 10% of the time   Medical Problem List and Plan:  1. 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 DVT Prophylaxis/Anticoagulation: Pharmaceutical: Coumadin   INR therapeutic on 7/23 3. Pain Management: tylenol prn  4. Mood: LCSW to follow for evaluation and support.  5. Neuropsych: This patient is capable of making decisions on his own behalf.  6. Skin/Wound Care: routine pressure relief measures.  7. Fluids/Electrolytes/Nutrition: Monitor I/O.  BMP within acceptable range on 7/23 8. CAD with ICM: Coumadin with INR goal 2-3. Continue coreg and Crestor.  9.T2DM: Metformin increased to 850mg  bid (PTA 1000 twice a day) CBG (last 3)   Recent Labs    06/24/18 1641 06/24/18 2146 06/25/18 0703  GLUCAP 109* 162* 120*   Labile on 7/23 10. Hypokalemia:  resolved 11. HTN: Monitor BP bid--on coreg bid. Off Vasotec at this time    Vitals:   06/24/18 1954 06/25/18 0502  BP: (!) 160/93 (!) 155/94  Pulse: 84 92  Resp: 18 18  Temp: 98.6 F (37 C) 98 F (36.7 C)  SpO2: 99% 99%   ?trending up on 7/23 12. TURP/BPH: On Proscar and Flomax.   Controlled 13. Hyponatremia  Sodium 135 on 7/23  Continue to monitor  LOS (Days) 7 A FACE TO FACE EVALUATION WAS PERFORMED  Sihaam Chrobak Karis Juba, MD 06/25/2018 8:31 AM

## 2018-06-25 NOTE — Progress Notes (Addendum)
ANTICOAGULATION CONSULT NOTE - Follow Up Consult  Pharmacy Consult for warfarin Indication: stroke  Allergies  Allergen Reactions  . Lipitor [Atorvastatin] Nausea And Vomiting    Patient Measurements: Height: 6\' 2"  (188 cm) Weight: 199 lb 11.8 oz (90.6 kg) IBW/kg (Calculated) : 82.2  Vital Signs: Temp: 98 F (36.7 C) (07/23 0502) Temp Source: Oral (07/23 0502) BP: 155/94 (07/23 0502) Pulse Rate: 92 (07/23 0502)  Labs: Recent Labs    06/24/18 2694 06/25/18 0425  HGB  --  13.8  HCT  --  43.5  PLT  --  433*  LABPROT 31.2* 29.9*  INR 3.04 2.88  CREATININE  --  1.05    Estimated Creatinine Clearance: 83.7 mL/min (by C-G formula based on SCr of 1.05 mg/dL).  Assessment: 63 YO M on warfarin PTA for hx CVA- held on admission for a scheduled procedure, per report patient restart post-procedure, but INR was subtherapeutic on admission. Pt presented to [facility] ED with RLE numbness, dizziness, and speech deficits and noted to have left chronic cerebellar cva and acute occipital lobe CVA. Pharmacy consulted for warfarin dosing.  INR therapeutic today at 2.88. CBC stable, no documented bleeding.  Goal of Therapy:  INR 2-3  Monitor platelets by anticoagulation protocol: Yes   Plan:  Warfarin 10mg  PO x1 Monitor INR daily until stable in therapeutic range  Continue to monitor for s/sx bleeding  Thank you for the consult. Please contact with any questions.    Harlow Mares, PharmD PGY1 Pharmacy Resident Phone [phone]  06/25/2018   8:36 AM   I discussed / reviewed the pharmacy note by Dr. Janee Morn and I agree with the resident's findings and plans as documented.  Dennie Fetters, [place] Pager: [phone] 06/25/2018 9:39 AM

## 2018-06-25 NOTE — Progress Notes (Signed)
Speech Language Pathology Daily Session Note  Patient Details  Name: Karl [name] MRN: [id number] Date of Birth: May 30, 1956  Today's Date: 06/25/2018 SLP Individual Time: 0730-0825 SLP Individual Time Calculation (min): 55 min  Short Term Goals: Week 1: SLP Short Term Goal 1 (Week 1): Patient will consume current diet with minimal overt s/s of aspiration or globus sensation with Mod I.  SLP Short Term Goal 2 (Week 1): Patient will demonstrate functional problem solving for mildly complex tasks with Min A verbal cues.  SLP Short Term Goal 3 (Week 1): Patient will recall new, daily information with supervision verbal cues.  SLP Short Term Goal 4 (Week 1): Patient will attend to left field of enviornment during functional tasks with supervision verbal cues.   Skilled Therapeutic Interventions: Skilled treatment session focused on dysphagia and cognitive goals. SLP facilitated session by providing encouragement and Min A verbal cues for problem solving and use of LUE during tray set-up with breakfast meal of Dys. 2 textures with thin liquids. Patient consumed meal without overt s/s of aspiration, therefore, recommend patient continue current diet. Patient continues to be hesitant in regards to trials of upgraded textures despite education from SLP. Therefore, SLP showed patient MBS video in attempts to maximize confidence in swallowing function. Patient eventually agreeable to trial upgraded textures at next session. Patient left upright in bed with alarm on and all needs within reach. Continue with current plan of care.      Function:  Eating Eating   Modified Consistency Diet: Yes Eating Assist Level: More than reasonable amount of time;Set up assist for   Eating Set Up Assist For: Opening containers       Cognition Comprehension Comprehension assist level: Understands basic 90% of the time/cues < 10% of the time  Expression   Expression assist level: Expresses basic 90% of the  time/requires cueing < 10% of the time.  Social Interaction Social Interaction assist level: Interacts appropriately 90% of the time - Needs monitoring or encouragement for participation or interaction.  Problem Solving Problem solving assist level: Solves basic 75 - 89% of the time/requires cueing 10 - 24% of the time  Memory Memory assist level: Recognizes or recalls 90% of the time/requires cueing < 10% of the time    Pain Pain Assessment Pain Scale: 0-10 Pain Score: 0-No pain  Therapy/Group: Individual Therapy  Gaetana Kawahara 06/25/2018, 9:51 AM

## 2018-06-25 NOTE — Evaluation (Signed)
Recreational Therapy Assessment and Plan  Patient Details  Name: Karl [name] MRN: [id number] Date of Birth: September 28, 1956 Today's Date: 06/25/2018  Rehab Potential: Good ELOS: 10-14 days  Assessment  Problem List:      Patient Active Problem List   Diagnosis Date Noted  . Acute ischemic right PCA stroke ([place]) 06/18/2018  . Ischemic cardiomyopathy   . CVA (cerebral vascular accident) ([place]) 06/14/2018  . Urinary retention 06/05/2018  . Encounter for therapeutic drug monitoring 02/08/2018  . History of CVA (cerebrovascular accident) 12/20/2017  . Coronary artery disease 12/19/2017  . Mild renal insufficiency 12/19/2017  . TIA (transient ischemic attack) 12/19/2017  . Hip flexor tendinitis 05/22/2017  . Abscess of back 05/22/2017  . Pain of both hip joints 04/25/2017  . Dyslipidemia 09/01/2015  . BPH without urinary obstruction 09/01/2015  . DM (diabetes mellitus), type 2, uncontrolled, periph vascular complic ([place]) 06/02/1600  . Essential hypertension 04/20/2009  . CARDIOMYOPATHY, ISCHEMIC 04/20/2009  . LEFT BUNDLE BRANCH BLOCK 04/20/2009  . Chronic systolic heart failure ([place]) 04/20/2009    Past Medical History:      Past Medical History:  Diagnosis Date  . Acute CVA (cerebrovascular accident) ([place]) 06/14/2018  . Coronary artery disease    blockage   Stent Dr. Lovena Le 15 years 1998  . Dyspnea   . Hypertension   . Stroke [facility]) 2019   denies residual on 06/14/2018  . Type II diabetes mellitus ([place]) 07/2014 dx   Past Surgical History:       Past Surgical History:  Procedure Laterality Date  . CORONARY ANGIOPLASTY WITH STENT PLACEMENT  1998  . FRACTURE SURGERY    . GREEN LIGHT LASER TURP (TRANSURETHRAL RESECTION OF PROSTATE N/A 06/05/2018   Procedure: GREEN LIGHT LASER TURP , TRANSURETHRAL RESECTION OF PROSTATE;  Surgeon: [name], MD;  Location: [facility] ORS;  Service: Urology;  Laterality: N/A;  . TIBIA FRACTURE SURGERY Left 1980s    Assessment &  Plan Clinical Impression: Patient is a 62 year old male with history of T2DM, CAD with ICM on chronic coumadin, prior CVA 12/2017, TURP 06/05/18 for BPH who was admitted on 06/14/18 with dizziness, RLE numbness, double vision and balance deficits. Patient had been off coumadin due to surgical procedure and INR 1.16 at admission. UDS negative. CTA head/neck showed hypoattenuation in right thalamus, right medial occipital and right superior cerebellum. MRI done revealing acute infarcts in R-PCA and R-SCA territories with punctate infract in L-PCA white matter felt to be embolic.  2 D echo showed EF 20% with diffuse hypokinesis and loosely organized thrombotic material at LV apex. He was started on IV heparin bridge till INR therapeutic. [name] felt to be embolic due to cardiac thrombus and heparin d/c once INR therapeutic. MBS done revealing functional swallow and he was placed on dysphagia 3, thins. He continues to be limited by balance deficits, left sided weakness with left hemianopsia and left inattention.  Patient transferred to CIR on 06/18/2018 .   Pt presents with decreased activity tolerance, decreased functional mobility, decreased balance, decreased coordination, decreased midline orientation, left inattention, decreased attention, decreased awareness, decreased problem solving and delayed processing Limiting pt's independence with leisure/community pursuits.  Plan Min 1 TR session during LOS >20 minutes  Recommendations for other services: None   Discharge Criteria: Patient will be discharged from TR if patient refuses treatment 3 consecutive times without medical reason.  If treatment goals not met, if there is a change in medical status, if patient makes no  progress towards goals or if patient is discharged from hospital.  The above assessment, treatment plan, treatment alternatives and goals were discussed and mutually agreed upon: by patient  [place] 06/25/2018, 11:56 AM

## 2018-06-25 NOTE — Progress Notes (Signed)
Physical Therapy Session Note  Patient Details  Name: Karl [name] MRN: [id number] Date of Birth: May 09, 1956  Today's Date: 06/25/2018 PT Individual Time: 1330-1415 PT Individual Time Calculation (min): 45 min   Short Term Goals: Week 1:  PT Short Term Goal 1 (Week 1): pt will perform bed mobility with supervision assist  PT Short Term Goal 2 (Week 1): Pt will perform bed<>WC transfers with min assist  PT Short Term Goal 3 (Week 1): Pt will maintain standing balance with min assist x 2 min   PT Short Term Goal 4 (Week 1): Pt will ambulate 58f with min assist and LRAD  PT Short Term Goal 5 (Week 1): Pt will attend to L side 558fof the time with min cues durring functional tasks.   Skilled Therapeutic Interventions/Progress Updates:   Patient received in bed, pleasant and willing to work with skilled PT services this afternoon. He requires just steadying assist for supine to sit but does require ModA for safety and balance with functional transfer from bed to WCWebster GrovesVC provided for WC safety and management, then practiced WC navigation approximately 15031fn hallway with MinA approximately 60% of the time for straight line navigation and Mod-Max VC for more effective use of L UE for navigation in hallway. Introduced RW today and able to gait train approximately 86f20fwith ModAx2 for safety; trialed use of 2-3# cuff weights to facilitate weight shift to R, patient also required ModAx2 for facilitation of lean to R during gait. He fatigued easily today and required multiple rest breaks during gait period. Otherwise worked on weight shifting and midline facilitation activities to reduce L lean, including cross midline reaches and static standing at midline with Mod facilitation from PT. He was fatigued at EOS and required ModA+2 and B knee block for sit to stand at end of session. He was left in bed with all needs met, bed alarm active this afternoon.      Therapy Documentation Precautions:   Precautions Precautions: Fall Precaution Comments: poor proprioceptive awareness of Lt  Restrictions Weight Bearing Restrictions: No   Denies pain.     See Function Navigator for Current Functional Status.   Therapy/Group: Individual Therapy   KrisDeniece Ree DPT, CBIS  Supplemental Physical Therapist [facility]   Pager [phone]-[place], DPT     06/25/2018, 2:57 PM

## 2018-06-25 NOTE — Progress Notes (Signed)
Physical Therapy Session Note  Patient Details  Name: Karl [name] MRN: [id number] Date of Birth: 1956/11/15  Today's Date: 06/25/2018 PT Individual Time: 0915-1000 PT Individual Time Calculation (min): 45 min   Short Term Goals: Week 1:  PT Short Term Goal 1 (Week 1): pt will perform bed mobility with supervision assist  PT Short Term Goal 2 (Week 1): Pt will perform bed<>WC transfers with min assist  PT Short Term Goal 3 (Week 1): Pt will maintain standing balance with min assist x 2 min   PT Short Term Goal 4 (Week 1): Pt will ambulate 97ft with min assist and LRAD  PT Short Term Goal 5 (Week 1): Pt will attend to L side 59ft of the time with min cues durring functional tasks.   Skilled Therapeutic Interventions/Progress Updates:    pt requests to wash up.  Pt performs squat pivot transfers throughout session with min A.  Sit to stands at sink for don/doff clothes and washing lower body with min A for sit to stand and standing balance due to Lt lean.  Pt able to use Lt hand to wash arms and legs with min cues for attention.  Standing balance in gym with focus on reducing Lt lean and wt shifting to Rt with reaching horseshoe task with pt improving with repetition.  Gait training with Lt  UE over PTs shoulders with min/mod A for wt shifts and Lt hip anterior translation, Lt lean more marked when fatigued.  Pt left in room with needs at hand, alarm set.  Therapy Documentation Precautions:  Precautions Precautions: Fall Precaution Comments: poor proprioceptive awareness of Lt  Restrictions Weight Bearing Restrictions: No Pain: Pain Assessment Pain Scale: 0-10 Pain Score: 0-No pain   Therapy/Group: Individual Therapy  Shalayna Ornstein 06/25/2018, 10:18 AM

## 2018-06-25 NOTE — Progress Notes (Signed)
Speech Language Pathology Daily Make-Up Session Note  Patient Details  Name: Karl [name] MRN: [id number] Date of Birth: [birth year]  Today's Date: 06/25/2018 SLP Individual Time: 1000-1030 SLP Individual Time Calculation (min): 30 min  Short Term Goals: Week 1: SLP Short Term Goal 1 (Week 1): Patient will consume current diet with minimal overt s/s of aspiration or globus sensation with Mod I.  SLP Short Term Goal 2 (Week 1): Patient will demonstrate functional problem solving for mildly complex tasks with Min A verbal cues.  SLP Short Term Goal 3 (Week 1): Patient will recall new, daily information with supervision verbal cues.  SLP Short Term Goal 4 (Week 1): Patient will attend to left field of enviornment during functional tasks with supervision verbal cues.   Skilled Therapeutic Interventions: Skilled treatment session focused on cognitive goals. SLP facilitated session by providing Min A verbal cues for problem solving during a novel, mildly complex card task. Patient also required Mod A verbal cues for utilization of LUE for assistance throughout task. Patient left upright in wheelchair with alarm on and all needs within reach. Continue with current plan of care.      Function:  Eating Eating   Modified Consistency Diet: Yes Eating Assist Level: More than reasonable amount of time;Set up assist for   Eating Set Up Assist For: Opening containers       Cognition Comprehension Comprehension assist level: Understands basic 90% of the time/cues < 10% of the time  Expression   Expression assist level: Expresses basic 90% of the time/requires cueing < 10% of the time.  Social Interaction Social Interaction assist level: Interacts appropriately 90% of the time - Needs monitoring or encouragement for participation or interaction.  Problem Solving Problem solving assist level: Solves basic 75 - 89% of the time/requires cueing 10 - 24% of the time  Memory Memory assist level:  Recognizes or recalls 90% of the time/requires cueing < 10% of the time    Pain Pain Assessment Pain Scale: 0-10 Pain Score: 0-No pain  Therapy/Group: Individual Therapy  Karl [name] 06/25/2018, 10:31 AM

## 2018-06-26 ENCOUNTER — Inpatient Hospital Stay (HOSPITAL_COMMUNITY): Payer: Managed Care, Other (non HMO) | Admitting: Occupational Therapy

## 2018-06-26 ENCOUNTER — Inpatient Hospital Stay (HOSPITAL_COMMUNITY): Payer: Managed Care, Other (non HMO) | Admitting: *Deleted

## 2018-06-26 ENCOUNTER — Inpatient Hospital Stay (HOSPITAL_COMMUNITY): Payer: Managed Care, Other (non HMO)

## 2018-06-26 ENCOUNTER — Inpatient Hospital Stay (HOSPITAL_COMMUNITY): Payer: Managed Care, Other (non HMO) | Admitting: Speech Pathology

## 2018-06-26 ENCOUNTER — Inpatient Hospital Stay (HOSPITAL_COMMUNITY): Payer: Managed Care, Other (non HMO) | Admitting: Physical Therapy

## 2018-06-26 DIAGNOSIS — N390 Urinary tract infection, site not specified: Secondary | ICD-10-CM

## 2018-06-26 LAB — PROTIME-INR
INR: 4.21
Prothrombin Time: 40.3 seconds — ABNORMAL HIGH (ref 11.4–15.2)

## 2018-06-26 LAB — GLUCOSE, CAPILLARY
GLUCOSE-CAPILLARY: 126 mg/dL — AB (ref 70–99)
GLUCOSE-CAPILLARY: 132 mg/dL — AB (ref 70–99)
GLUCOSE-CAPILLARY: 113 mg/dL — AB (ref 70–99)
Glucose-Capillary: 151 mg/dL — ABNORMAL HIGH (ref 70–99)

## 2018-06-26 MED ORDER — NITROFURANTOIN MONOHYD MACRO 100 MG PO CAPS
100.0000 mg | ORAL_CAPSULE | Freq: Two times a day (BID) | ORAL | Status: AC
  Administered 2018-06-26 – 2018-07-02 (×14): 100 mg via ORAL
  Filled 2018-06-26 (×14): qty 1

## 2018-06-26 NOTE — Progress Notes (Signed)
Occupational Therapy Weekly Progress Note  Patient Details  Name: Karl [name] MRN: [id number] Date of Birth: March 01, 1956  Beginning of progress report period: June 19, 2018 End of progress report period: June 26, 2018  Today's Date: 06/26/2018 OT Individual Time: 9450-3888 OT Individual Time Calculation (min): 60 min   Patient has met 3 of 3 short term goals.  Pt is making progress towards OT goals at this time. He has improved sit<>stands and can sit<>stand with min/CGA. Transfers are a more consistent Min A with improved standing balance to manage clothing.  Pt has demonstrated some improved L attention, and improved L UE coordination within functional BADL tasks.  Patient continues to demonstrate the following deficits: muscle weakness, impaired timing and sequencing, ataxia, decreased coordination and decreased motor planning, decreased attention to left, decreased initiation, decreased awareness, decreased problem solving and decreased safety awareness and decreased standing balance, decreased postural control, hemiplegia and decreased balance strategies and therefore will continue to benefit from skilled OT intervention to enhance overall performance with BADL.  Patient progressing toward long term goals..  Continue plan of care.  OT Short Term Goals Week 1:  OT Short Term Goal 1 (Week 1): Pt will recall hemi dressing techniques with min questioning cues OT Short Term Goal 1 - Progress (Week 1): Met OT Short Term Goal 2 (Week 1): Pt will complete sit<>stand with no more than min A in preparation for BADL tasks OT Short Term Goal 2 - Progress (Week 1): Met OT Short Term Goal 3 (Week 1): Pt will complete toilet transfer with min A OT Short Term Goal 3 - Progress (Week 1): Met Week 2:  OT Short Term Goal 1 (Week 2): Pt will maintain standing balance at the sink with no more than CGA while performing grooming task. OT Short Term Goal 2 (Week 2): Pt will complete shower transfer onto bench  with no more than CGA  OT Short Term Goal 3 (Week 2): Pt will utilize L UE during bathing tasks with no more than 3 verbal cues OT Short Term Goal 4 (Week 2): Pt will be given home fine motor program  Skilled Therapeutic Interventions/Progress Updates:    Pt greeted semi-reclined in bed and agreeable to OT treatment session. Pt requests to use the bathroom and shower. Pt completed stand-pivot to wc with min A and verbal cues for technique. Pt completed similar transfer to toilet using grab bars with min A, Min A for balance when reaching to manage clothing. Pt voided bowel and bladder successfully. Worked on hip hike for pt to be able to toilet himself which pt was able to do using R UE. Stand-pivot > shower with grab bars and verbal cues. Improved head/hips relationship. Bathing completed with verbal cues to integrate L UE into bathing tasks for neuro re-ed. B UE coordination task of wringing out wash cloths. Dressing completed wc at the sink with increased time and min A to thread LLE into pants and min A for balance when standing to pull up pants. Worked on standing balance/endurance for stanidng grooming task of brushing teeth. With L UE supported on sink, pt able to maintain standing balance with overall CGA during functional task. Pt returned to sitting and left seated in wc with alarm belt on and needs met.   Therapy Documentation Precautions:  Precautions Precautions: Fall Precaution Comments: poor proprioceptive awareness of Lt  Restrictions Weight Bearing Restrictions: No Pain:  none/denies pain ADL: ADL ADL Comments: Please see functional navigator  See Function  Navigator for Current Functional Status.   Therapy/Group: Individual Therapy  Valma Cava 06/26/2018, 10:11 AM

## 2018-06-26 NOTE — Progress Notes (Signed)
Physical Therapy Weekly Progress Note  Patient Details  Name: Karl [name] MRN: [id number] Date of Birth: 1956-07-31  Beginning of progress report period: June 19, 2018 End of progress report period: June 26, 2018   Patient has met 0 of 5 short term goals.  Pt making slow functional progress and is limited by decreased attention to Lt, impaired balance and motor planning. Pt able to improve activity tolerance and is progressing towards min A level.  Patient continues to demonstrate the following deficits muscle weakness, unbalanced muscle activation, decreased coordination and decreased motor planning, decreased attention, decreased awareness, decreased problem solving, decreased safety awareness and delayed processing and decreased standing balance, decreased postural control and decreased balance strategies and therefore will continue to benefit from skilled PT intervention to increase functional independence with mobility.  Patient not progressing toward long term goals.  See goal revision..  Continue plan of care.  PT Short Term Goals Week 1:  PT Short Term Goal 1 (Week 1): pt will perform bed mobility with supervision assist  PT Short Term Goal 1 - Progress (Week 1): Progressing toward goal PT Short Term Goal 2 (Week 1): Pt will perform bed<>WC transfers with min assist  PT Short Term Goal 2 - Progress (Week 1): Progressing toward goal PT Short Term Goal 3 (Week 1): Pt will maintain standing balance with min assist x 2 min   PT Short Term Goal 3 - Progress (Week 1): Progressing toward goal PT Short Term Goal 4 (Week 1): Pt will ambulate 100f with min assist and LRAD  PT Short Term Goal 4 - Progress (Week 1): Progressing toward goal PT Short Term Goal 5 (Week 1): Pt will attend to L side 534fof the time with min cues durring functional tasks.  PT Short Term Goal 5 - Progress (Week 1): Progressing toward goal Week 2:  PT Short Term Goal 1 (Week 2): pt will perform functional transfers  with min A PT Short Term Goal 2 (Week 2): pt will perform gait with mod A x 50' in controlled environment  Skilled Therapeutic Interventions/Progress Updates:  Ambulation/gait training;Balance/vestibular training;Discharge planning;Community reintegration;Cognitive remediation/compensation;Disease maEditor, commissioningunctional electrical stimulation;Functional mobility training;Neuromuscular re-education;Patient/family education;Pain management;Psychosocial support;Skin care/wound management;Stair training;Splinting/orthotics;Therapeutic Activities;Therapeutic Exercise;UE/LE Coordination activities;UE/LE Strength taining/ROM;Visual/perceptual remediation/compensation;Wheelchair propulsion/positioning    Netta Fodge 06/26/2018, 8:49 AM

## 2018-06-26 NOTE — Progress Notes (Signed)
Recreational Therapy Session Note  Patient Details  Name: Karl [name] MRN: [id number] Date of Birth: 30-Mar-1956 Today's Date: 06/26/2018 Time:  1133-1150 Pain: no c/o Skilled Therapeutic Interventions/Progress Updates: Session focused on stress management including definition of stress, what triggers his stress & how he manages stress.  Pt stated understanding.  Notnamed Scholz 06/26/2018, 3:50 PM

## 2018-06-26 NOTE — Progress Notes (Addendum)
Physical Therapy Session Note  Patient Details  Name: Karl [name] MRN: [id number] Date of Birth: 06/13/1956  Today's Date: 06/26/2018 PT Individual Time: 1400-1450 PT Individual Time Calculation (min): 50 min ; including 15 min make- up time  Short Term Goals:  Week 2:  PT Short Term Goal 1 (Week 2): pt will perform functional transfers with min A PT Short Term Goal 2 (Week 2): pt will perform gait with mod A x 50' in controlled environment :     Skilled Therapeutic Interventions/Progress Updates:   Pt stated that he was tired, but not in pain.  Squat pivot w/c > mat to r with min assist.   Therapeutic activity in unsupported sitting, reaching out of BOS to L to grasp and match playing card using L hand, to board to R, in front of him.  100% accurate with matching.  1 LOB to R, regained independently.  Sit>< stand using Bobath method with bil hands clasped together in front of pt, focusing on neck and trunk flexion and forward wt shift.  Min assist with this method.  Continued with activity of matching cards, sit>< standing, x 4 before fatiguing.   neuromuscular re-education via demo and mulitmodal cues for pelvic activation for reciprocal scooting in sitting, feet supported.  In r lateral lean, manual cues to elicit L hip protraction and retraction.    Stand pivot mat> w/c to L with mod assist, pt's hands on PT's shoulders to encourage trunk extension, max cues for wt shifting and stepping sequence.  Pt left resting in w/c with seat belt alarm applied and all needs within reach.         Therapy Documentation Precautions:  Precautions Precautions: Fall Precaution Comments: poor proprioceptive awareness of Lt  Restrictions Weight Bearing Restrictions: No   Pain: Pain Assessment Pain Scale: 0-10 Pain Score: 0-No pain      See Function Navigator for Current Functional Status.   Therapy/Group: Individual Therapy  Worthy Boschert 06/26/2018, 4:45 PM

## 2018-06-26 NOTE — Progress Notes (Signed)
ANTICOAGULATION CONSULT NOTE - Follow Up Consult  Pharmacy Consult for warfarin Indication: stroke  Allergies  Allergen Reactions  . Lipitor [Atorvastatin] Nausea And Vomiting    Patient Measurements: Height: 6\' 2"  (188 cm) Weight: 197 lb 15.6 oz (89.8 kg) IBW/kg (Calculated) : 82.2  Vital Signs: Temp: 97.7 F (36.5 C) (07/24 0527) Temp Source: Oral (07/24 0527) BP: 108/78 (07/24 0527) Pulse Rate: 74 (07/24 0527)  Labs: Recent Labs    06/24/18 0630 06/25/18 0425 06/26/18 0652  HGB  --  13.8  --   HCT  --  43.5  --   PLT  --  433*  --   LABPROT 31.2* 29.9* 40.3*  INR 3.04 2.88 4.21*  CREATININE  --  1.05  --     Estimated Creatinine Clearance: 83.7 mL/min (by C-G formula based on SCr of 1.05 mg/dL).  Assessment: 62 YO M on warfarin PTA for hx CVA- held on admission for a scheduled procedure, per report patient restart post-procedure, but INR was subtherapeutic on admission. 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 Pharmacy consulted for warfarin dosing.  INR supratherapeutic today at 4.21. CBC stable, no documented bleeding.  Goal of Therapy:  INR 2-3  Monitor platelets by anticoagulation protocol: Yes   Plan:  Hold warfarin x1 Monitor INR daily until stable in therapeutic range  Continue to monitor for s/sx bleeding  Thank you for the consult. Please contact with any questions.   Ewing Schlein, PharmD PGY1 Pharmacy Resident 06/26/2018    9:28 AM

## 2018-06-26 NOTE — Progress Notes (Signed)
Speech Language Pathology Weekly Progress and Session Note  Patient Details  Name: Karl [name] MRN: [id number] Date of Birth: 1956/11/28  Beginning of progress report period: June 19, 2018 End of progress report period: June 26, 2018  Today's Date: 06/26/2018 SLP Individual Time: 5456-2563 SLP Individual Time Calculation (min): 55 min  Short Term Goals: Week 1: SLP Short Term Goal 1 (Week 1): Patient will consume current diet with minimal overt s/s of aspiration or globus sensation with Mod I.  SLP Short Term Goal 1 - Progress (Week 1): Met SLP Short Term Goal 2 (Week 1): Patient will demonstrate functional problem solving for mildly complex tasks with Min A verbal cues.  SLP Short Term Goal 2 - Progress (Week 1): Met SLP Short Term Goal 3 (Week 1): Patient will recall new, daily information with supervision verbal cues.  SLP Short Term Goal 3 - Progress (Week 1): Met SLP Short Term Goal 4 (Week 1): Patient will attend to left field of enviornment during functional tasks with supervision verbal cues.  SLP Short Term Goal 4 - Progress (Week 1): Not met    New Short Term Goals: Week 2: SLP Short Term Goal 1 (Week 2): Patient will demonstrate functional problem solving for mildly complex tasks with supervision verbal cues.  SLP Short Term Goal 2 (Week 2): Patient will attend to left field of enviornment during functional tasks with supervision verbal cues.  SLP Short Term Goal 3 (Week 2): Patient will recall new, daily information with Mod I.  SLP Short Term Goal 4 (Week 2): Patient will consume Dys. 3 textures with efficient mastication without overt s/s of aspiration or globus sensation with Mod I.   Weekly Progress Updates: Patient has made functional gains and has met 3 of 4 STG's this reporting period. Currently, patient is consuming Dys. 2 textures with thin liquids without overt s/s of aspiration and consuming trials of Dys. 3 textures without a globus sensation with plans to  upgrade. Patient also requires supervision verbal cues for recall and Min A verbal cues for complex problem solving and attention to left field of environment. Patient and family education is ongoing. Patient would benefit from continued skilled SLP intervention to maximize his cognitive and swallowing function and overall functional independence prior to discharge.       Intensity: Minumum of 1-2 x/day, 30 to 90 minutes Frequency: 3 to 5 out of 7 days Duration/Length of Stay: 8/6 Treatment/Interventions: Cognitive remediation/compensation;Cueing hierarchy;Environmental controls;Functional tasks;Patient/family education;Therapeutic Activities;Internal/external aids   Daily Session  Skilled Therapeutic Interventions: Skilled treatment session focused on dysphagia and cognitive goals. SLP facilitated session by providing trials of Dys. 3 textures. Patient demonstrated efficient mastication without overt s/s of aspiration or a globus sensation. Therefore, recommend trial tray prior to upgrade. SLP also facilitated session by providing supervision verbal cues for complex problem solving and Mod I for recall of procedures to a previously learned task. Patient left upright in bed with alarm on and all needs within reach. Continue with current plan of care.        Function:   Eating Eating   Modified Consistency Diet: Yes Eating Assist Level: Set up assist for;Supervision or verbal cues   Eating Set Up Assist For: Opening containers       Cognition Comprehension Comprehension assist level: Understands basic 90% of the time/cues < 10% of the time  Expression   Expression assist level: Expresses basic 90% of the time/requires cueing < 10% of the time.  Social Interaction Social  Interaction assist level: Interacts appropriately 90% of the time - Needs monitoring or encouragement for participation or interaction.  Problem Solving Problem solving assist level: Solves basic 75 - 89% of the  time/requires cueing 10 - 24% of the time  Memory Memory assist level: Recognizes or recalls 90% of the time/requires cueing < 10% of the time   Pain Pain Assessment Pain Scale: 0-10 Pain Score: 0-No pain  Therapy/Group: Individual Therapy  Trenda Corliss 06/26/2018, 3:45 PM

## 2018-06-26 NOTE — Progress Notes (Signed)
[place] PHYSICAL MEDICINE & REHABILITATION     PROGRESS NOTE    Subjective/Complaints: Patient seen sitting up in bed this morning. He states he slept well overnight. He denies complaints.  ROS:  Denies CP, SOB, vomiting, diarrhea.  Objective:  No results found. Recent Labs    06/25/18 0425  WBC 8.6  HGB 13.8  HCT 43.5  PLT 433*   Recent Labs    06/25/18 0425  NA 135  K 4.3  CL 99  GLUCOSE 149*  BUN 12  CREATININE 1.05  CALCIUM 9.3   CBG (last 3)  Recent Labs    06/25/18 1642 06/25/18 2117 06/26/18 0630  GLUCAP 115* 163* 126*    Wt Readings from Last 3 Encounters:  06/26/18 89.8 kg (197 lb 15.6 oz)  06/14/18 105.2 kg (232 lb)  06/11/18 105.2 kg (232 lb)     Intake/Output Summary (Last 24 hours) at 06/26/2018 0844 Last data filed at 06/26/2018 0000 Gross per 24 hour  Intake 480 ml  Output 245 ml  Net 235 ml    Vital Signs: Blood pressure 108/78, pulse 74, temperature 97.7 F (36.5 C), temperature source Oral, resp. rate 18, height 6\' 2"  (1.88 m), weight 89.8 kg (197 lb 15.6 oz), SpO2 98 %. Physical Exam:  Constitutional: No distress . Vital signs reviewed. HENT: Normocephalic.  Atraumatic. Eyes: EOMI. No discharge. Cardiovascular: RRR. No JVD. Respiratory: CTA bilaterally. Normal effort. GI: BS +. Non-distended. Musc: No edema or tenderness in extremities. Neurological:  Left central 7.  Mild dysarthria Motor:  5/5 RUE/RLE.  LUE 4-4+/5 prox to distal (stable).  LLE 4+/5 (stable). Psych: normal mood. Normal affect.   Assessment/Plan: 1. Functional and mobility deficits secondary to bilateral posterior circulation infarcts which require 3+ hours per day of interdisciplinary therapy in a comprehensive inpatient rehab setting. Physiatrist is providing close team supervision and 24 hour management of active medical problems listed below. Physiatrist and rehab team continue to assess barriers to discharge/monitor patient progress toward  functional and medical goals.  Function:  Bathing Bathing position Bathing activity did not occur: N/A Position: Shower  Bathing parts Body parts bathed by patient: Right arm, Left arm, Chest, Abdomen, Front perineal area, Right upper leg, Left upper leg, Buttocks, Left lower leg, Right lower leg Body parts bathed by helper: Right lower leg, Left lower leg, Buttocks  Bathing assist Assist Level: Touching or steadying assistance(Pt > 75%)      Upper Body Dressing/Undressing Upper body dressing   What is the patient wearing?: Pull over shirt/dress     Pull over shirt/dress - Perfomed by patient: Thread/unthread right sleeve, Thread/unthread left sleeve, Put head through opening, Pull shirt over trunk          Upper body assist Assist Level: Set up, Supervision or verbal cues      Lower Body Dressing/Undressing Lower body dressing Lower body dressing/undressing activity did not occur: N/A What is the patient wearing?: Non-skid slipper socks, Pants, Underwear Underwear - Performed by patient: Thread/unthread left underwear leg, Pull underwear up/down, Thread/unthread right underwear leg   Pants- Performed by patient: Thread/unthread right pants leg, Thread/unthread left pants leg, Pull pants up/down Pants- Performed by helper: Fasten/unfasten pants Non-skid slipper socks- Performed by patient: Don/doff right sock Non-skid slipper socks- Performed by helper: Don/doff left sock, Don/doff right sock                  Lower body assist Assist for lower body dressing: Touching or steadying assistance (Pt > 75%)  Toileting Toileting   Toileting steps completed by patient: Adjust clothing prior to toileting, Performs perineal hygiene Toileting steps completed by helper: Adjust clothing after toileting Toileting Assistive Devices: Grab bar or rail  Toileting assist Assist level: (mod A)   Transfers Chair/bed transfer   Chair/bed transfer method: Stand pivot Chair/bed  transfer assist level: Moderate assist (Pt 50 - 74%/lift or lower) Chair/bed transfer assistive device: Armrests     Locomotion Ambulation     Max distance: 72ft  Assist level: 2 helpers(Mod(A) +2 for safety)   Wheelchair   Type: Manual Max wheelchair distance: 150 Assist Level: Touching or steadying assistance (Pt > 75%)  Cognition Comprehension Comprehension assist level: Understands basic 90% of the time/cues < 10% of the time  Expression Expression assist level: Expresses basic 90% of the time/requires cueing < 10% of the time.  Social Interaction Social Interaction assist level: Interacts appropriately 90% of the time - Needs monitoring or encouragement for participation or interaction.  Problem Solving Problem solving assist level: Solves basic 75 - 89% of the time/requires cueing 10 - 24% of the time  Memory Memory assist level: Recognizes or recalls 90% of the time/requires cueing < 10% of the time   Medical Problem List and Plan:  1. 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 DVT Prophylaxis/Anticoagulation: Pharmaceutical: Coumadin   INR supratherapeutic on 7/24 3. Pain Management: tylenol prn  4. Mood: LCSW to follow for evaluation and support.  5. Neuropsych: This patient is capable of making decisions on his own behalf.  6. Skin/Wound Care: routine pressure relief measures.  7. Fluids/Electrolytes/Nutrition: Monitor I/O.  BMP within acceptable range on 7/23 8. CAD with ICM: Coumadin with INR goal 2-3. Continue coreg and Crestor.  9.T2DM: Metformin increased to 850mg  bid (PTA 1000 twice a day) CBG (last 3)  Recent Labs    06/25/18 1642 06/25/18 2117 06/26/18 0630  GLUCAP 115* 163* 126*   Labile 7/24, will not make any changes at present 10. Hypokalemia:  resolved 11. HTN: Monitor BP bid--on coreg bid. Off Vasotec at this time    Vitals:    06/25/18 2003 06/26/18 0527  BP: 106/73 108/78  Pulse: 76 74  Resp: 17 18  Temp: 97.6 F (36.4 C) 97.7 F (36.5 C)  SpO2: 99% 98%   Controlled on 7/24 12. TURP/BPH: On Proscar and Flomax.   Controlled 13. Hyponatremia  Sodium 135 on 7/23  Continue to monitor 14. Acute lower UTI  UA +, urine culture pending  Empiric Macrobid started on 7/24  LOS (Days) 8 A FACE TO FACE EVALUATION WAS PERFORMED  Nuri Branca Karis Juba, MD 06/26/2018 8:44 AM

## 2018-06-26 NOTE — Progress Notes (Signed)
Physical Therapy Session Note  Patient Details  Name: Karl [name] MRN: [id number] Date of Birth: February 23, 1956  Today's Date: 06/26/2018 PT Individual Time: 1030-1055 PT Individual Time Calculation (min): 25 min   Short Term Goals: Week 2:  PT Short Term Goal 1 (Week 2): pt will perform functional transfers with min A PT Short Term Goal 2 (Week 2): pt will perform gait with mod A x 50' in controlled environment  Skilled Therapeutic Interventions/Progress Updates:  Treatment session focused on posture and midline positioning.  Pt transfers to [facility] with min/mod A, mod cuing for sequencing and motor planning.  Standing on kinetron with mirror in front pt performs marching and then coming to midline with min/mod manual and visual cues.  Pt states he feels "off balance" when in midline but was able to improve throughout session.  Pt left in bed with alarm set, needs at hand.  Therapy Documentation Precautions:  Precautions Precautions: Fall Precaution Comments: poor proprioceptive awareness of Lt  Restrictions Weight Bearing Restrictions: No Pain: Pain Assessment Pain Scale: 0-10 Pain Score: 0-No pain   Therapy/Group: Individual Therapy  DONAWERTH,KAREN 06/26/2018, 10:54 AM

## 2018-06-26 NOTE — Progress Notes (Signed)
Occupational Therapy Session Note  Patient Details  Name: Karl [name] MRN: [id number] Date of Birth: 07-Nov-1956  Today's Date: 06/26/2018 OT Individual Time: 1300-1330 OT Individual Time Calculation (min): 30 min    Short Term Goals: Week 1:  OT Short Term Goal 1 (Week 1): Pt will recall hemi dressing techniques with min questioning cues OT Short Term Goal 2 (Week 1): Pt will complete sit<>stand with no more than min A in preparation for BADL tasks OT Short Term Goal 3 (Week 1): Pt will complete toilet transfer with min A  Skilled Therapeutic Interventions/Progress Updates:    Pt received supine in bed, agreeable to therapy with no c/o pain. Pt transitioned to EOB from flat bed with use of bed rails with (S). CGA provided during squat pivot transfer to w/c. Pt propelled w/c 75 ft to therapy gym with increased time and frequent vc for attention to L side of hallway for obstacles. Pt stood and engaged in board game with therapist for 12 min with no seated break. Pt returned to room and left sitting up in w/c with chair alarm belt fastened and all needs met.   Therapy Documentation Precautions:  Precautions Precautions: Fall Precaution Comments: poor proprioceptive awareness of Lt  Restrictions Weight Bearing Restrictions: No   Vital Signs: Therapy Vitals Temp: 98.4 F (36.9 C) Temp Source: Oral Pulse Rate: 93 Resp: 19 BP: 116/86 Patient Position (if appropriate): Sitting Oxygen Therapy SpO2: 100 % O2 Device: Room Air Pain: Pain Assessment Pain Scale: 0-10 Pain Score: 0-No pain ADL: ADL ADL Comments: Please see functional navigator  See Function Navigator for Current Functional Status.   Therapy/Group: Individual Therapy  Curtis Sites 06/26/2018, 3:16 PM

## 2018-06-26 NOTE — Significant Event (Signed)
CRITICAL VALUE STICKER  CRITICAL VALUE: INR 4.21  RECEIVER (on-site recipient of call): Foye Clock, RN/ Nigel Sloop, LPN   DATE & TIME NOTIFIED: 06/26/18 @0800       MD NOTIFIED: Harvel Ricks ,PA   TIME OF NOTIFICATION: 0800   RESPONSE: Pharmacy will adjust coumadin

## 2018-06-26 NOTE — Progress Notes (Signed)
Social Work Patient ID: Karl [name], male   DOB: 01/08/1956, 61 y.o.   MRN: [id number]   CSW met with pt 06-25-18 following conference to update him on his progress and targeted d/c date of 07-09-18.  Pt was pleased with this and has seen progress and hopes to see even more before his d/c.  Pt gave CSW permission to call his dtr to update her as well.  CSW talked with dtr, Karl [name], via telephone also on 06-25-18 and she was also pleased with d/c date and she confirmed that she will be pt's primary caregiver with other family members helping her.  She plans to come for family education as we get closer to the d/c date.  Both dtr and pt were appreciative of update and care pt has received.  

## 2018-06-27 ENCOUNTER — Inpatient Hospital Stay (HOSPITAL_COMMUNITY): Payer: Managed Care, Other (non HMO) | Admitting: Occupational Therapy

## 2018-06-27 ENCOUNTER — Inpatient Hospital Stay (HOSPITAL_COMMUNITY): Payer: Managed Care, Other (non HMO) | Admitting: Speech Pathology

## 2018-06-27 ENCOUNTER — Inpatient Hospital Stay (HOSPITAL_COMMUNITY): Payer: Managed Care, Other (non HMO) | Admitting: Physical Therapy

## 2018-06-27 LAB — GLUCOSE, CAPILLARY
GLUCOSE-CAPILLARY: 144 mg/dL — AB (ref 70–99)
GLUCOSE-CAPILLARY: 133 mg/dL — AB (ref 70–99)
GLUCOSE-CAPILLARY: 165 mg/dL — AB (ref 70–99)
Glucose-Capillary: 147 mg/dL — ABNORMAL HIGH (ref 70–99)

## 2018-06-27 LAB — URINE CULTURE

## 2018-06-27 LAB — PROTIME-INR
INR: 3.6
PROTHROMBIN TIME: 35.7 s — AB (ref 11.4–15.2)

## 2018-06-27 NOTE — Progress Notes (Signed)
Speech Language Pathology Daily Session Note  Patient Details  Name: Karl [name] MRN: [id number] Date of Birth: Feb 20, 1956  Today's Date: 06/27/2018 SLP Individual Time: 1100-1200 SLP Individual Time Calculation (min): 60 min  Short Term Goals: Week 2: SLP Short Term Goal 1 (Week 2): Patient will demonstrate functional problem solving for mildly complex tasks with supervision verbal cues.  SLP Short Term Goal 2 (Week 2): Patient will attend to left field of enviornment during functional tasks with supervision verbal cues.  SLP Short Term Goal 3 (Week 2): Patient will recall new, daily information with Mod I.  SLP Short Term Goal 4 (Week 2): Patient will consume Dys. 3 textures with efficient mastication without overt s/s of aspiration or globus sensation with Mod I.   Skilled Therapeutic Interventions: Skilled treatment session focused on cognitive and dysphagia goals. SLP facilitated session by providing extra time and Min A verbal cues for problem solving during a calendar organization task. SLP also facilitated session by providing skilled observation with upgraded lunch meal of Dys. 3 textures with thin liquids. Patient consumed meal without overt s/s of aspiration and efficient mastication with Mod I for use of swallowing compensatory. Recommend patient upgrade to Dys. 3 textures and patient was in agreement. Patient left upright in wheelchair with alarm on and all needs within reach. Continue with current plan of care.      Function:  Eating Eating   Modified Consistency Diet: Yes Eating Assist Level: Set up assist for;Swallowing techniques: self managed   Eating Set Up Assist For: Opening containers       Cognition Comprehension Comprehension assist level: Understands basic 90% of the time/cues < 10% of the time  Expression   Expression assist level: Expresses basic 90% of the time/requires cueing < 10% of the time.  Social Interaction Social Interaction assist level:  Interacts appropriately 90% of the time - Needs monitoring or encouragement for participation or interaction.  Problem Solving Problem solving assist level: Solves basic 75 - 89% of the time/requires cueing 10 - 24% of the time  Memory Memory assist level: Recognizes or recalls 90% of the time/requires cueing < 10% of the time    Pain No/Denies Pain   Therapy/Group: Individual Therapy  [name] 06/27/2018, 3:28 PM

## 2018-06-27 NOTE — Progress Notes (Signed)
ANTICOAGULATION CONSULT NOTE - Follow Up Consult  Pharmacy Consult for Coumadin Indication: stroke  Allergies  Allergen Reactions  . Lipitor [Atorvastatin] Nausea And Vomiting    Patient Measurements: Height: 6\' 2"  (188 cm) Weight: 197 lb 1.5 oz (89.4 kg) IBW/kg (Calculated) : 82.2  Vital Signs: Temp: 97.7 F (36.5 C) (07/25 0510) Temp Source: Oral (07/25 0510) BP: 130/80 (07/25 0510) Pulse Rate: 80 (07/25 0510)  Labs: Recent Labs    06/25/18 0425 06/26/18 0652 06/27/18 0505  HGB 13.8  --   --   HCT 43.5  --   --   PLT 433*  --   --   LABPROT 29.9* 40.3* 35.7*  INR 2.88 4.21* 3.60  CREATININE 1.05  --   --     Estimated Creatinine Clearance: 83.7 mL/min (by C-G formula based on SCr of 1.05 mg/dL).  Assessment:  62 YO M on warfarin PTA for hx CVA- held on admission for a scheduled procedure, per report patient restarted post-procedure, but INR was subtherapeutic on admission. 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 Pharmacy consulted for warfarin dosing.    INR had been therapeutic for several days, then supratherapeutic (4.21) yesterday and Coumadin held.  INR down to 3.60 today but still supratherapeutic.   Home Coumadin regimen:  10 mg daily  Goal of Therapy:  INR 2-3 Monitor platelets by anticoagulation protocol: Yes   Plan:   Hold Coumadin again today.  Continue daily PT/INR.  Dennie Fetters, RPh Pager: [phone] 06/27/2018,9:49 AM

## 2018-06-27 NOTE — Progress Notes (Signed)
Occupational Therapy Session Note  Patient Details  Name: Karl [name] MRN: [id number] Date of Birth: 25-Feb-1956  Today's Date: 06/27/2018 OT Individual Time: 1000-1100 OT Individual Time Calculation (min): 60 min   Short Term Goals: Week 2:  OT Short Term Goal 1 (Week 2): Pt will maintain standing balance at the sink with no more than CGA while performing grooming task. OT Short Term Goal 2 (Week 2): Pt will complete shower transfer onto bench with no more than CGA  OT Short Term Goal 3 (Week 2): Pt will utilize L UE during bathing tasks with no more than 3 verbal cues OT Short Term Goal 4 (Week 2): Pt will be given home fine motor program  Skilled Therapeutic Interventions/Progress Updates:    Pt greeted semi-reclined in bed and agreeable to OT treatment session. Pt came to sitting EOB with increased time and verbal cues to scoot forward and get feet on the floor. He then completed stand-pivot to wc with min A. Stand-pivot into shower with verbal cues for hand placement and increased time to initiate. Bathing completed at overall close supervision level with improved initiation to integrate L UE into bathing tasks. Dressing completed wc at the sink with verbal cues to recall hemi dressing techniques today. CGA sit<>stands with increased time and verbal cues to bring L UE up to the sink as he was still holding onto wc arm rests. Facilitated anterior weight shift in standing to reach for items at the sink. Forced use of L UE throughout dressing tasks for neuro re-ed. Pt left seated in wc at end of session with safety belt on and needs met.   Therapy Documentation Precautions:  Precautions Precautions: Fall Precaution Comments: poor proprioceptive awareness of Lt  Restrictions Weight Bearing Restrictions: No Pain:  none/denies pain ADL: ADL ADL Comments: Please see functional navigator  See Function Navigator for Current Functional Status.  Therapy/Group: Individual  Therapy  Valma Cava 06/27/2018, 10:32 AM

## 2018-06-27 NOTE — Progress Notes (Signed)
[place] PHYSICAL MEDICINE & REHABILITATION     PROGRESS NOTE    Subjective/Complaints: Patient seen sitting up in bed this morning. He states he slept well overnight.  ROS:  Denies CP, SOB, vomiting, diarrhea.  Objective:  No results found. Recent Labs    06/25/18 0425  WBC 8.6  HGB 13.8  HCT 43.5  PLT 433*   Recent Labs    06/25/18 0425  NA 135  K 4.3  CL 99  GLUCOSE 149*  BUN 12  CREATININE 1.05  CALCIUM 9.3   CBG (last 3)  Recent Labs    06/26/18 1645 06/26/18 2125 06/27/18 0629  GLUCAP 132* 113* 144*    Wt Readings from Last 3 Encounters:  06/27/18 89.4 kg (197 lb 1.5 oz)  06/14/18 105.2 kg (232 lb)  06/11/18 105.2 kg (232 lb)     Intake/Output Summary (Last 24 hours) at 06/27/2018 0849 Last data filed at 06/27/2018 6578 Gross per 24 hour  Intake 720 ml  Output 200 ml  Net 520 ml    Vital Signs: Blood pressure 130/80, pulse 80, temperature 97.7 F (36.5 C), temperature source Oral, resp. rate 17, height 6\' 2"  (1.88 m), weight 89.4 kg (197 lb 1.5 oz), SpO2 100 %. Physical Exam:  Constitutional: No distress . Vital signs reviewed. HENT: Normocephalic.  Atraumatic. Eyes: EOMI. No discharge. Cardiovascular: RRR. No JVD. Respiratory: CTA bilaterally. Normal effort. GI: BS +. Non-distended. Musc: No edema or tenderness in extremities. Neurological:  Left central 7.  Mild dysarthria Motor:  5/5 RUE/RLE.  LUE 4-4+/5 prox to distal (unchanged).  LLE 4+/5 (unchanged). Psych: normal mood. Normal affect.   Assessment/Plan: 1. Functional and mobility deficits secondary to bilateral posterior circulation infarcts which require 3+ hours per day of interdisciplinary therapy in a comprehensive inpatient rehab setting. Physiatrist is providing close team supervision and 24 hour management of active medical problems listed below. Physiatrist and rehab team continue to assess barriers to discharge/monitor patient progress toward functional and medical  goals.  Function:  Bathing Bathing position Bathing activity did not occur: N/A Position: Shower  Bathing parts Body parts bathed by patient: Right arm, Left arm, Chest, Abdomen, Front perineal area, Right upper leg, Left upper leg, Buttocks, Left lower leg, Right lower leg Body parts bathed by helper: Right lower leg, Left lower leg, Buttocks  Bathing assist Assist Level: Touching or steadying assistance(Pt > 75%)      Upper Body Dressing/Undressing Upper body dressing   What is the patient wearing?: Pull over shirt/dress     Pull over shirt/dress - Perfomed by patient: Thread/unthread right sleeve, Thread/unthread left sleeve, Put head through opening, Pull shirt over trunk          Upper body assist Assist Level: Set up, Supervision or verbal cues      Lower Body Dressing/Undressing Lower body dressing Lower body dressing/undressing activity did not occur: N/A What is the patient wearing?: Non-skid slipper socks, Pants, Underwear Underwear - Performed by patient: Thread/unthread left underwear leg, Pull underwear up/down, Thread/unthread right underwear leg   Pants- Performed by patient: Thread/unthread right pants leg, Thread/unthread left pants leg, Pull pants up/down Pants- Performed by helper: Fasten/unfasten pants Non-skid slipper socks- Performed by patient: Don/doff right sock, Don/doff left sock Non-skid slipper socks- Performed by helper: Don/doff left sock, Don/doff right sock                  Lower body assist Assist for lower body dressing: Touching or steadying assistance (Pt > 75%)  Toileting Toileting   Toileting steps completed by patient: Adjust clothing prior to toileting, Performs perineal hygiene, Adjust clothing after toileting Toileting steps completed by helper: Adjust clothing after toileting Toileting Assistive Devices: Grab bar or rail  Toileting assist Assist level: Touching or steadying assistance (Pt.75%)   Transfers Chair/bed  transfer   Chair/bed transfer method: Stand pivot Chair/bed transfer assist level: Moderate assist (Pt 50 - 74%/lift or lower) Chair/bed transfer assistive device: Armrests     Locomotion Ambulation     Max distance: 58ft  Assist level: 2 helpers(Mod(A) +2 for safety)   Wheelchair   Type: Manual Max wheelchair distance: 150 Assist Level: Touching or steadying assistance (Pt > 75%)  Cognition Comprehension Comprehension assist level: Understands basic 90% of the time/cues < 10% of the time  Expression Expression assist level: Expresses basic 90% of the time/requires cueing < 10% of the time.  Social Interaction Social Interaction assist level: Interacts appropriately 90% of the time - Needs monitoring or encouragement for participation or interaction.  Problem Solving Problem solving assist level: Solves basic 75 - 89% of the time/requires cueing 10 - 24% of the time  Memory Memory assist level: Recognizes or recalls 90% of the time/requires cueing < 10% of the time   Medical Problem List and Plan:  1. 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 DVT Prophylaxis/Anticoagulation: Pharmaceutical: Coumadin   INR supratherapeutic on 7/25 3. Pain Management: tylenol prn  4. Mood: LCSW to follow for evaluation and support.  5. Neuropsych: This patient is capable of making decisions on his own behalf.  6. Skin/Wound Care: routine pressure relief measures.  7. Fluids/Electrolytes/Nutrition: Monitor I/O.  BMP within acceptable range on 7/23 8. CAD with ICM: Coumadin with INR goal 2-3. Continue coreg and Crestor.  9.T2DM: Metformin increased to 850mg  bid (PTA 1000 twice a day) CBG (last 3)  Recent Labs    06/26/18 1645 06/26/18 2125 06/27/18 0629  GLUCAP 132* 113* 144*   Labile on 7/25 10. Hypokalemia:  resolved 11. HTN: Monitor BP bid--on coreg bid. Off Vasotec at  this time    Vitals:   06/26/18 2027 06/27/18 0510  BP: 124/75 130/80  Pulse: 77 80  Resp: 17 17  Temp: 100 F (37.8 C) 97.7 F (36.5 C)  SpO2: 98% 100%   Controlled on 7/25 12. TURP/BPH: On Proscar and Flomax.   Controlled 13. Hyponatremia  Sodium 135 on 7/23  Continue to monitor 14. Acute lower UTI  UA +, urine culture remains pending  Empiric Macrobid started on 7/24  LOS (Days) 9 A FACE TO FACE EVALUATION WAS PERFORMED  Adhira Jamil Karis Juba, MD 06/27/2018 8:49 AM

## 2018-06-27 NOTE — Progress Notes (Signed)
Physical Therapy Session Note  Patient Details  Name: Karl [name] MRN: [id number] Date of Birth: 1956-07-21  Today's Date: 06/27/2018 PT Individual Time: 1300-1408 PT Individual Time Calculation (min): 68 min   Short Term Goals: Week 2:  PT Short Term Goal 1 (Week 2): pt will perform functional transfers with min A PT Short Term Goal 2 (Week 2): pt will perform gait with mod A x 50' in controlled environment  Skilled Therapeutic Interventions/Progress Updates:   pt performs w/c mobility with bilat UEs in controlled environment with close supervision.  Stand pivot and squat pivot transfers throughout session with min A, min manual facilitation for wt shifts and cues for UE placement.  Sit to stands with adduction squeeze between knees with mod/max A for balance and lifting without UE support.  Gait training with Lt UE around PTs shoulders 2 x 50' with turns with min/mod A, facilitation for Lt hip anterior translation and wt shifts to Rt.  Side stepping with mod manual facilitation for wt shifts, verbal and tactile cues for motor planning.  nustep x 5 minutes level 5 for increased activity tolerance.  Pt performs sit to supine with supervision and increased time. Pt left in bed with needs at hand, alarm set.  Therapy Documentation Precautions:  Precautions Precautions: Fall Precaution Comments: poor proprioceptive awareness of Lt  Restrictions Weight Bearing Restrictions: No Pain:  no c/o pain   Therapy/Group: Individual Therapy  Leo Weyandt 06/27/2018, 2:08 PM

## 2018-06-27 NOTE — Progress Notes (Signed)
Small amount of bleeding noted at meatus post void noted.

## 2018-06-28 ENCOUNTER — Inpatient Hospital Stay (HOSPITAL_COMMUNITY): Payer: Managed Care, Other (non HMO) | Admitting: Physical Therapy

## 2018-06-28 ENCOUNTER — Inpatient Hospital Stay (HOSPITAL_COMMUNITY): Payer: Managed Care, Other (non HMO) | Admitting: Speech Pathology

## 2018-06-28 ENCOUNTER — Inpatient Hospital Stay (HOSPITAL_COMMUNITY): Payer: Managed Care, Other (non HMO) | Admitting: Occupational Therapy

## 2018-06-28 ENCOUNTER — Inpatient Hospital Stay (HOSPITAL_COMMUNITY): Payer: Managed Care, Other (non HMO)

## 2018-06-28 DIAGNOSIS — Z7901 Long term (current) use of anticoagulants: Secondary | ICD-10-CM

## 2018-06-28 LAB — PROTIME-INR
INR: 2.47
PROTHROMBIN TIME: 26.6 s — AB (ref 11.4–15.2)

## 2018-06-28 LAB — GLUCOSE, CAPILLARY
Glucose-Capillary: 125 mg/dL — ABNORMAL HIGH (ref 70–99)
Glucose-Capillary: 151 mg/dL — ABNORMAL HIGH (ref 70–99)
Glucose-Capillary: 173 mg/dL — ABNORMAL HIGH (ref 70–99)
Glucose-Capillary: 117 mg/dL — ABNORMAL HIGH (ref 70–99)

## 2018-06-28 MED ORDER — WARFARIN - PHARMACIST DOSING INPATIENT
Freq: Every day | Status: DC
  Administered 2018-06-28 – 2018-07-02 (×5)

## 2018-06-28 MED ORDER — WARFARIN SODIUM 7.5 MG PO TABS
7.5000 mg | ORAL_TABLET | Freq: Once | ORAL | Status: AC
  Administered 2018-06-28: 7.5 mg via ORAL
  Filled 2018-06-28: qty 1

## 2018-06-28 NOTE — Progress Notes (Signed)
Speech Language Pathology Daily Session Note  Patient Details  Name: SPYROS BALKO MRN: [id number] Date of Birth: 10-29-1956  Today's Date: 06/28/2018 SLP Individual Time: 1500-1525 SLP Individual Time Calculation (min): 25 min  Short Term Goals: Week 2: SLP Short Term Goal 1 (Week 2): Patient will demonstrate functional problem solving for mildly complex tasks with supervision verbal cues.  SLP Short Term Goal 2 (Week 2): Patient will attend to left field of enviornment during functional tasks with supervision verbal cues.  SLP Short Term Goal 3 (Week 2): Patient will recall new, daily information with Mod I.  SLP Short Term Goal 4 (Week 2): Patient will consume Dys. 3 textures with efficient mastication without overt s/s of aspiration or globus sensation with Mod I.   Skilled Therapeutic Interventions:  Pt was seen for skilled ST targeting cognitive goals.  SLP facilitated the session with a previously targeted but incomplete scheduling task.  Pt needed min-mod assist for problem solving and task organization due to perseveration and slowed processing.  Pt was left in wheelchair with call bell within reach.  Continue per current plan of care.     Function:  Eating Eating                 Cognition Comprehension Comprehension assist level: Understands basic 90% of the time/cues < 10% of the time  Expression   Expression assist level: Expresses basic 90% of the time/requires cueing < 10% of the time.  Social Interaction Social Interaction assist level: Interacts appropriately 90% of the time - Needs monitoring or encouragement for participation or interaction.  Problem Solving Problem solving assist level: Solves basic 75 - 89% of the time/requires cueing 10 - 24% of the time  Memory Memory assist level: Recognizes or recalls 90% of the time/requires cueing < 10% of the time    Pain Pain Assessment Pain Scale: 0-10 Pain Score: 0-No pain  Therapy/Group: Individual  Therapy  Jazara Swiney, Melanee Spry 06/28/2018, 4:44 PM

## 2018-06-28 NOTE — Progress Notes (Signed)
Occupational Therapy Session Note  Patient Details  Name: Karl [name] MRN: [id number] Date of Birth: 1955-12-18  Today's Date: 06/28/2018 OT Individual Time: 0903-1000 OT Individual Time Calculation (min): 57 min   Short Term Goals: Week 2:  OT Short Term Goal 1 (Week 2): Pt will maintain standing balance at the sink with no more than CGA while performing grooming task. OT Short Term Goal 2 (Week 2): Pt will complete shower transfer onto bench with no more than CGA  OT Short Term Goal 3 (Week 2): Pt will utilize L UE during bathing tasks with no more than 3 verbal cues OT Short Term Goal 4 (Week 2): Pt will be given home fine motor program  Skilled Therapeutic Interventions/Progress Updates:    Pt greeted semi-reclined in bed and agreeable to OT treatment session. Pt reported need for bathroom. Stand-pivot bed>wc>toilet with overall min A and verbal cues for L hand placement and facilitation for head/hips relationship. Pt with successful void of bowel and bladder, then worked on hip hike to reach behind for toileting. Min A to stand from toilet and min A for balance while managing clothing using B UEs. Increased time to integrate L UE into task. Hand washing task completed in standing at the sink with min/CGA for balance when reaching outside base of support to grab paper towels. B UE coordination with graded buttoning task. Pt needed increased time and multiple rest breaks to complete task. Progressed to tying activity which pt was able to complete with increased time. Peg board task focused on in-hand manipulation, translation, and rotation of pegs in board. Pt returned to room and left seated in wc with alarm belt on and needs met.   Therapy Documentation Precautions:  Precautions Precautions: Fall Precaution Comments: poor proprioceptive awareness of Lt  Restrictions Weight Bearing Restrictions: No Pain:   none/denies pain ADL: ADL ADL Comments: Please see functional navigator See  Function Navigator for Current Functional Status.   Therapy/Group: Individual Therapy  Valma Cava 06/28/2018, 9:19 AM

## 2018-06-28 NOTE — Progress Notes (Signed)
ANTICOAGULATION CONSULT NOTE - Follow Up Consult  Pharmacy Consult for Coumadin Indication: stroke  Allergies  Allergen Reactions  . Lipitor [Atorvastatin] Nausea And Vomiting    Patient Measurements: Height: 6\' 2"  (188 cm) Weight: 197 lb 1.5 oz (89.4 kg) IBW/kg (Calculated) : 82.2  Vital Signs: Temp: 97.6 F (36.4 C) (07/26 0525) Temp Source: Oral (07/26 0525) BP: 122/81 (07/26 0525) Pulse Rate: 74 (07/26 0525)  Labs: Recent Labs    06/26/18 0652 06/27/18 0505 06/28/18 0600  LABPROT 40.3* 35.7* 26.6*  INR 4.21* 3.60 2.47    Estimated Creatinine Clearance: 83.7 mL/min (by C-G formula based on SCr of 1.05 mg/dL).  Assessment:  62 YO M on warfarin PTA for hx CVA- held on admission for a scheduled procedure, per report patient restarted post-procedure, but INR was subtherapeutic on admission. 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 Pharmacy consulted for warfarin dosing.    INR back down into therapeutic range at 2.47 today after being supratherapeutic x 2 days and warfarin being held x 2 days.   Home warfarin regimen:  10 mg daily  Goal of Therapy:  INR 2-3 Monitor platelets by anticoagulation protocol: Yes   Plan:  Give warfarin 7.5 mg po x 1 Monitor daily INR, CBC, clinical course, s/sx of bleed, PO intake, DDI  Thank you for allowing [place] to participate in this patients care.   Signe Colt, PharmD Please utilize Amion (under [facility]) for appropriate number for your unit pharmacist. # [phone] until 3:30 pm 06/28/2018 11:21 AM

## 2018-06-28 NOTE — Progress Notes (Signed)
Physical Therapy Session Note  Patient Details  Name: Karl [name] MRN: [id number] Date of Birth: 1956-09-21  Today's Date: 06/28/2018 PT Individual Time: 1025-1121 PT Individual Time Calculation (min): 56 min   Short Term Goals: Week 2:  PT Short Term Goal 1 (Week 2): pt will perform functional transfers with min A PT Short Term Goal 2 (Week 2): pt will perform gait with mod A x 50' in controlled environment  Skilled Therapeutic Interventions/Progress Updates:    squat pivot transfers throughout session with min A, manual facilitation for wt shifts.  Sit <> stands without UE support x 5 with manual facilitation for midline orientation and forward wt shifts.  Sit <> stands without UE with Lt LE on foam with focus on midline orientation to decrease pushing with improved posture with repetition. Reciprocal taps to 4'' step with focus on increasing speed of wt shifts and Lt LE speed with improvement noticed with repetition, min/mod A.  Gait with RW with min A, improved vs last session, manual facilitation to increase Lt step length and wt shifts to Rt.  nustep for continued reciprocal movements level 4 x 5 minutes.  Pt left in bed with needs at hand, alarm set.  Therapy Documentation Precautions:  Precautions Precautions: Fall Precaution Comments: poor proprioceptive awareness of Lt  Restrictions Weight Bearing Restrictions: No Pain: No c/o pain   Therapy/Group: Individual Therapy  Troi Bechtold 06/28/2018, 11:22 AM

## 2018-06-28 NOTE — Progress Notes (Signed)
[place] PHYSICAL MEDICINE & REHABILITATION     PROGRESS NOTE    Subjective/Complaints: Patient seen sitting up in bed this morning. He states he slept well over night. He denies complaints.  ROS:  Denies CP, SOB, vomiting, diarrhea.  Objective:  No results found. No results for input(s): WBC, HGB, HCT, PLT in the last 72 hours. No results for input(s): NA, K, CL, GLUCOSE, BUN, CREATININE, CALCIUM in the last 72 hours.  Invalid input(s): CO CBG (last 3)  Recent Labs    06/27/18 1652 06/27/18 2114 06/28/18 0632  GLUCAP 133* 165* 125*    Wt Readings from Last 3 Encounters:  06/27/18 89.4 kg (197 lb 1.5 oz)  06/14/18 105.2 kg (232 lb)  06/11/18 105.2 kg (232 lb)     Intake/Output Summary (Last 24 hours) at 06/28/2018 0924 Last data filed at 06/28/2018 1610 Gross per 24 hour  Intake 460 ml  Output -  Net 460 ml    Vital Signs: Blood pressure 122/81, pulse 74, temperature 97.6 F (36.4 C), temperature source Oral, resp. rate 16, height 6\' 2"  (1.88 m), weight 89.4 kg (197 lb 1.5 oz), SpO2 99 %. Physical Exam:  Constitutional: No distress . Vital signs reviewed. HENT: Normocephalic.  Atraumatic. Eyes: EOMI. No discharge. Cardiovascular: RRR. No JVD. Respiratory: CTA bilaterally. Normal effort. GI: BS +. Non-distended. Musc: No edema or tenderness in extremities. Neurological:  Left central 7.  Mild dysarthria Motor:  5/5 RUE/RLE.  LUE 4-4+/5 prox to distal (stable).  LLE 4+/5 (stable). Psych: normal mood. Normal affect.  Assessment/Plan: 1. Functional and mobility deficits secondary to bilateral posterior circulation infarcts which require 3+ hours per day of interdisciplinary therapy in a comprehensive inpatient rehab setting. Physiatrist is providing close team supervision and 24 hour management of active medical problems listed below. Physiatrist and rehab team continue to assess barriers to discharge/monitor patient progress toward functional and medical  goals.  Function:  Bathing Bathing position Bathing activity did not occur: N/A Position: Shower  Bathing parts Body parts bathed by patient: Right arm, Left arm, Chest, Abdomen, Front perineal area, Right upper leg, Left upper leg, Buttocks, Left lower leg, Right lower leg Body parts bathed by helper: Right lower leg, Left lower leg, Buttocks  Bathing assist Assist Level: Supervision or verbal cues      Upper Body Dressing/Undressing Upper body dressing   What is the patient wearing?: Pull over shirt/dress     Pull over shirt/dress - Perfomed by patient: Thread/unthread right sleeve, Thread/unthread left sleeve, Put head through opening, Pull shirt over trunk          Upper body assist Assist Level: Set up, Supervision or verbal cues      Lower Body Dressing/Undressing Lower body dressing Lower body dressing/undressing activity did not occur: N/A What is the patient wearing?: Non-skid slipper socks, Pants, Underwear Underwear - Performed by patient: Thread/unthread left underwear leg, Pull underwear up/down, Thread/unthread right underwear leg   Pants- Performed by patient: Thread/unthread right pants leg, Thread/unthread left pants leg, Pull pants up/down Pants- Performed by helper: Fasten/unfasten pants Non-skid slipper socks- Performed by patient: Don/doff right sock, Don/doff left sock Non-skid slipper socks- Performed by helper: Don/doff left sock, Don/doff right sock                  Lower body assist Assist for lower body dressing: Touching or steadying assistance (Pt > 75%)      Toileting Toileting   Toileting steps completed by patient: Adjust clothing prior to  toileting, Performs perineal hygiene, Adjust clothing after toileting Toileting steps completed by helper: Adjust clothing after toileting Toileting Assistive Devices: Grab bar or rail  Toileting assist Assist level: Touching or steadying assistance (Pt.75%)   Transfers Chair/bed transfer    Chair/bed transfer method: Stand pivot Chair/bed transfer assist level: Moderate assist (Pt 50 - 74%/lift or lower) Chair/bed transfer assistive device: Armrests     Locomotion Ambulation     Max distance: 16ft  Assist level: 2 helpers(Mod(A) +2 for safety)   Wheelchair   Type: Manual Max wheelchair distance: 150 Assist Level: Touching or steadying assistance (Pt > 75%)  Cognition Comprehension Comprehension assist level: Understands basic 90% of the time/cues < 10% of the time  Expression Expression assist level: Expresses basic 90% of the time/requires cueing < 10% of the time.  Social Interaction Social Interaction assist level: Interacts appropriately 90% of the time - Needs monitoring or encouragement for participation or interaction.  Problem Solving Problem solving assist level: Solves basic 75 - 89% of the time/requires cueing 10 - 24% of the time  Memory Memory assist level: Recognizes or recalls 90% of the time/requires cueing < 10% of the time   Medical Problem List and Plan:  1. 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 DVT Prophylaxis/Anticoagulation: Pharmaceutical: Coumadin   INR therapeutic on 7/26 3. Pain Management: tylenol prn  4. Mood: LCSW to follow for evaluation and support.  5. Neuropsych: This patient is capable of making decisions on his own behalf.  6. Skin/Wound Care: routine pressure relief measures.  7. Fluids/Electrolytes/Nutrition: Monitor I/O.  BMP within acceptable range on 7/23 8. CAD with ICM: Coumadin with INR goal 2-3. Continue coreg and Crestor.  9.T2DM: Metformin increased to 850mg  bid (PTA 1000 twice a day) CBG (last 3)  Recent Labs    06/27/18 1652 06/27/18 2114 06/28/18 0632  GLUCAP 133* 165* 125*   Slightly labile, but relatively controlled on 7/26 10. Hypokalemia:  resolved 11. HTN: Monitor BP bid--on coreg  bid. Off Vasotec at this time    Vitals:   06/27/18 2001 06/28/18 0525  BP: 122/78 122/81  Pulse: 90 74  Resp: 16 16  Temp: 99.1 F (37.3 C) 97.6 F (36.4 C)  SpO2: 100% 99%   Controlled on 7/26 12. TURP/BPH: On Proscar and Flomax.   Controlled 13. Hyponatremia  Sodium 135 on 7/23  Continue to monitor 14. Acute lower UTI  UA +, urine culture with multiple species  Continue Empiric Macrobid started on 7/24  LOS (Days) 10 A FACE TO FACE EVALUATION WAS PERFORMED  Ankit Karis Juba, MD 06/28/2018 9:24 AM

## 2018-06-28 NOTE — Progress Notes (Signed)
Physical Therapy Session Note  Patient Details  Name: Karl [name] MRN: [id number] Date of Birth: 31-Aug-1956  Today's Date: 06/28/2018 PT Individual Time: 1420-1500 PT Individual Time Calculation (min): 40 min   Short Term Goals: Week 2:  PT Short Term Goal 1 (Week 2): pt will perform functional transfers with min A PT Short Term Goal 2 (Week 2): pt will perform gait with mod A x 50' in controlled environment  Skilled Therapeutic Interventions/Progress Updates:    Functional transfer from bed -> w/c with extra time and cues for technique, mod assist initially for stand step technique due to facilitation for weightshift. Pt request to use bathroom and performed transfers with grab bar with min assist and dynamic standing balance for clothing management. NMR on Kinetron in standing position to address postural control, weightshifting, motor control and activation in BLE, and functional reaching/grasp and coordination in LUE. Used mirror for visual feedback and verbal and tactile cues for equal weightbearing. W/c propulsion with BUE for functional mobility and NMR for LUE coordination/strength x 50' with supervision for cues for technique. Handoff to SLP.  Therapy Documentation Precautions:  Precautions Precautions: Fall Precaution Comments: poor proprioceptive awareness of Lt  Restrictions Weight Bearing Restrictions: No   Pain:   Denies pain  See Function Navigator for Current Functional Status.   Therapy/Group: Individual Therapy  Karolee Stamps Darrol Poke, PT, DPT  06/28/2018, 3:37 PM

## 2018-06-29 ENCOUNTER — Inpatient Hospital Stay (HOSPITAL_COMMUNITY): Payer: Managed Care, Other (non HMO) | Admitting: Occupational Therapy

## 2018-06-29 ENCOUNTER — Inpatient Hospital Stay (HOSPITAL_COMMUNITY): Payer: Managed Care, Other (non HMO) | Admitting: Physical Therapy

## 2018-06-29 DIAGNOSIS — R0989 Other specified symptoms and signs involving the circulatory and respiratory systems: Secondary | ICD-10-CM

## 2018-06-29 DIAGNOSIS — R791 Abnormal coagulation profile: Secondary | ICD-10-CM

## 2018-06-29 LAB — GLUCOSE, CAPILLARY
GLUCOSE-CAPILLARY: 165 mg/dL — AB (ref 70–99)
GLUCOSE-CAPILLARY: 131 mg/dL — AB (ref 70–99)
Glucose-Capillary: 125 mg/dL — ABNORMAL HIGH (ref 70–99)
Glucose-Capillary: 136 mg/dL — ABNORMAL HIGH (ref 70–99)

## 2018-06-29 LAB — PROTIME-INR
INR: 1.78
PROTHROMBIN TIME: 20.6 s — AB (ref 11.4–15.2)

## 2018-06-29 MED ORDER — WARFARIN SODIUM 5 MG PO TABS
10.0000 mg | ORAL_TABLET | Freq: Once | ORAL | Status: AC
  Administered 2018-06-29: 10 mg via ORAL
  Filled 2018-06-29: qty 2

## 2018-06-29 NOTE — Progress Notes (Signed)
Occupational Therapy Session Note  Patient Details  Name: Karl [name] MRN: [id number] Date of Birth: July 28, 1956  Today's Date: 06/29/2018 OT Individual Time: 1003-1100 OT Individual Time Calculation (min): 57 min   Short Term Goals: Week 2:  OT Short Term Goal 1 (Week 2): Pt will maintain standing balance at the sink with no more than CGA while performing grooming task. OT Short Term Goal 2 (Week 2): Pt will complete shower transfer onto bench with no more than CGA  OT Short Term Goal 3 (Week 2): Pt will utilize L UE during bathing tasks with no more than 3 verbal cues OT Short Term Goal 4 (Week 2): Pt will be given home fine motor program  Skilled Therapeutic Interventions/Progress Updates:    Pt greeted semi-reclined in bed and agreeable to OT treatment session. Pt requested to shower today. Stand-pivot transfers completed to R and L side with increased time to initiate, but overall min A. Bathing completed with set-up A and close supervision for standing balance when standing to wash bottom in the shower. Min verbal cues to integrate L UE into bathing tasks. L attention and L hand coordination to locate grooming items on L side of the sink. Dressing completed wc level at the sink with increased time and overall supervision assist. Min verbal cues for forced use of L UE when pulling up pants. Provided pt with handouts for putty exercises and home fine motor program. Had pt read directions out loud, then complete them with min verbal cues for technique. Pt returned to room at end of session and left seated in wc with alarm belt on and needs met.   Therapy Documentation Precautions:  Precautions Precautions: Fall Precaution Comments: poor proprioceptive awareness of Lt  Restrictions Weight Bearing Restrictions: No Pain: Pain Assessment Pain Scale: 0-10 Pain Score: 0-No pain ADL: ADL ADL Comments: Please see functional navigator  See Function Navigator for Current Functional  Status.   Therapy/Group: Individual Therapy  Valma Cava 06/29/2018, 10:15 AM

## 2018-06-29 NOTE — Progress Notes (Signed)
ANTICOAGULATION CONSULT NOTE - Follow Up Consult  Pharmacy Consult for Coumadin Indication: stroke  Allergies  Allergen Reactions  . Lipitor [Atorvastatin] Nausea And Vomiting    Patient Measurements: Height: 6\' 2"  (188 cm) Weight: 194 lb 14.2 oz (88.4 kg) IBW/kg (Calculated) : 82.2  Vital Signs: Temp: 98.2 F (36.8 C) (07/27 0430) Temp Source: Oral (07/27 0430) BP: 156/86 (07/27 0430) Pulse Rate: 72 (07/27 0430)  Labs: Recent Labs    06/27/18 0505 06/28/18 0600 06/29/18 0458  LABPROT 35.7* 26.6* 20.6*  INR 3.60 2.47 1.78    Estimated Creatinine Clearance: 83.7 mL/min (by C-G formula based on SCr of 1.05 mg/dL).  Assessment: 62 YO M on warfarin PTA for hx CVA- held on admission for a scheduled procedure, per report patient restarted post-procedure, but INR was subtherapeutic on admission. 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 Pharmacy consulted for warfarin dosing.   INR is now subtherapeutic at 1.78.  This is likely due to warfarin being held x 2 days (7/24-7/25) in response to a supratherapeutic INR.     Home warfarin regimen:  10 mg daily  Goal of Therapy:  INR 2-3 Monitor platelets by anticoagulation protocol: Yes   Plan:  Give warfarin 10 mg po x 1 Monitor daily INR, CBC, clinical course, s/sx of bleed, PO intake, DDI  Thank you for allowing [place] to participate in this patients care.   Estella Husk, Pharm.D., BCPS, BCIDP Clinical Pharmacist Phone: [phone] Please check AMION for all [facility] numbers 06/29/2018, 8:40 AM

## 2018-06-29 NOTE — Plan of Care (Signed)
  Problem: Consults Goal: RH STROKE PATIENT EDUCATION Description See Patient Education module for education specifics  Outcome: Progressing   Problem: RH BOWEL ELIMINATION Goal: RH STG MANAGE BOWEL WITH ASSISTANCE Description STG Manage Bowel with  Min Assistance.   Outcome: Progressing Goal: RH STG MANAGE BOWEL W/MEDICATION W/ASSISTANCE Description STG Manage Bowel with Medication with mod I Assistance.  Outcome: Progressing   Problem: RH SAFETY Goal: RH STG ADHERE TO SAFETY PRECAUTIONS W/ASSISTANCE/DEVICE Description STG Adhere to Safety Precautions With cues, supervision Assistance/Device.  Outcome: Progressing   Problem: RH KNOWLEDGE DEFICIT Goal: RH STG INCREASE KNOWLEDGE OF DIABETES Description Pt and family will be able to explain rationale for and schedule for medications,  CBG checks and dietary restrictions using resources/cues/reminders min assist  Outcome: Progressing Goal: RH STG INCREASE KNOWLEDGE OF HYPERTENSION Description PT will be able to manage HTN with family using resources/cues/reminders/handouts min assist   Outcome: Progressing   

## 2018-06-29 NOTE — Progress Notes (Signed)
[place] PHYSICAL MEDICINE & REHABILITATION     PROGRESS NOTE    Subjective/Complaints: Patient seen lying in bed this morning. He states he slept well overnight. He states he needs to use the restroom.  ROS:  Denies CP, SOB, vomiting, diarrhea.  Objective:  No results found. No results for input(s): WBC, HGB, HCT, PLT in the last 72 hours. No results for input(s): NA, K, CL, GLUCOSE, BUN, CREATININE, CALCIUM in the last 72 hours.  Invalid input(s): CO CBG (last 3)  Recent Labs    06/28/18 1657 06/28/18 2146 06/29/18 0646  GLUCAP 173* 117* 125*    Wt Readings from Last 3 Encounters:  06/29/18 88.4 kg (194 lb 14.2 oz)  06/14/18 105.2 kg (232 lb)  06/11/18 105.2 kg (232 lb)     Intake/Output Summary (Last 24 hours) at 06/29/2018 0836 Last data filed at 06/28/2018 1700 Gross per 24 hour  Intake 480 ml  Output -  Net 480 ml    Vital Signs: Blood pressure (!) 156/86, pulse 72, temperature 98.2 F (36.8 C), temperature source Oral, resp. rate 17, height 6\' 2"  (1.88 m), weight 88.4 kg (194 lb 14.2 oz), SpO2 98 %. Physical Exam:  Constitutional: No distress . Vital signs reviewed. HENT: Normocephalic.  Atraumatic. Eyes: EOMI. No discharge. Cardiovascular: RRR. No JVD. Respiratory: CTA bilaterally. Normal effort. GI: BS +. Non-distended. Musc: No edema or tenderness in extremities. Neurological:  Left central 7.  Mild dysarthria Motor:  5/5 RUE/RLE.  LUE 4-4+/5 prox to distal (unchanged).  LLE 4+/5 (unchanged). Psych: normal mood. Normal affect.  Assessment/Plan: 1. Functional and mobility deficits secondary to bilateral posterior circulation infarcts which require 3+ hours per day of interdisciplinary therapy in a comprehensive inpatient rehab setting. Physiatrist is providing close team supervision and 24 hour management of active medical problems listed below. Physiatrist and rehab team continue to assess barriers to discharge/monitor patient progress toward  functional and medical goals.  Function:  Bathing Bathing position Bathing activity did not occur: N/A Position: Shower  Bathing parts Body parts bathed by patient: Right arm, Left arm, Chest, Abdomen, Front perineal area, Right upper leg, Left upper leg, Buttocks, Left lower leg, Right lower leg Body parts bathed by helper: Right lower leg, Left lower leg, Buttocks  Bathing assist Assist Level: Supervision or verbal cues      Upper Body Dressing/Undressing Upper body dressing   What is the patient wearing?: Pull over shirt/dress     Pull over shirt/dress - Perfomed by patient: Thread/unthread right sleeve, Thread/unthread left sleeve, Put head through opening, Pull shirt over trunk          Upper body assist Assist Level: Set up, Supervision or verbal cues      Lower Body Dressing/Undressing Lower body dressing Lower body dressing/undressing activity did not occur: N/A What is the patient wearing?: Non-skid slipper socks, Pants, Underwear Underwear - Performed by patient: Thread/unthread left underwear leg, Pull underwear up/down, Thread/unthread right underwear leg   Pants- Performed by patient: Thread/unthread right pants leg, Thread/unthread left pants leg, Pull pants up/down Pants- Performed by helper: Fasten/unfasten pants Non-skid slipper socks- Performed by patient: Don/doff right sock, Don/doff left sock Non-skid slipper socks- Performed by helper: Don/doff left sock, Don/doff right sock                  Lower body assist Assist for lower body dressing: Touching or steadying assistance (Pt > 75%)      Toileting Toileting   Toileting steps completed by patient:  Adjust clothing prior to toileting, Performs perineal hygiene, Adjust clothing after toileting Toileting steps completed by helper: Adjust clothing after toileting Toileting Assistive Devices: Grab bar or rail  Toileting assist Assist level: Touching or steadying assistance (Pt.75%)    Transfers Chair/bed transfer   Chair/bed transfer method: Stand pivot Chair/bed transfer assist level: Moderate assist (Pt 50 - 74%/lift or lower) Chair/bed transfer assistive device: Armrests     Locomotion Ambulation     Max distance: 40ft  Assist level: 2 helpers(Mod(A) +2 for safety)   Wheelchair   Type: Manual Max wheelchair distance: 50 Assist Level: Supervision or verbal cues  Cognition Comprehension Comprehension assist level: Understands basic 90% of the time/cues < 10% of the time  Expression Expression assist level: Expresses basic 90% of the time/requires cueing < 10% of the time.  Social Interaction Social Interaction assist level: Interacts appropriately 90% of the time - Needs monitoring or encouragement for participation or interaction.  Problem Solving Problem solving assist level: Solves basic 75 - 89% of the time/requires cueing 10 - 24% of the time  Memory Memory assist level: Recognizes or recalls 90% of the time/requires cueing < 10% of the time   Medical Problem List and Plan:  1. 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 DVT Prophylaxis/Anticoagulation: Pharmaceutical: Coumadin   INR subtherapeutic on 7/27 3. Pain Management: tylenol prn  4. Mood: LCSW to follow for evaluation and support.  5. Neuropsych: This patient is capable of making decisions on his own behalf.  6. Skin/Wound Care: routine pressure relief measures.  7. Fluids/Electrolytes/Nutrition: Monitor I/O.  BMP within acceptable range on 7/23 8. CAD with ICM: Coumadin with INR goal 2-3. Continue coreg and Crestor.  9.T2DM: Metformin increased to 850mg  bid (PTA 1000 twice a day) CBG (last 3)  Recent Labs    06/28/18 1657 06/28/18 2146 06/29/18 0646  GLUCAP 173* 117* 125*   Slightly labile, but overall controlled on 7/27 10. Hypokalemia:  resolved 11. HTN: Monitor BP  bid--on coreg bid. Off Vasotec at this time    Vitals:   06/28/18 1953 06/29/18 0430  BP: 111/79 (!) 156/86  Pulse: 84 72  Resp: 16 17  Temp: 98.5 F (36.9 C) 98.2 F (36.8 C)  SpO2: 100% 98%   Labile on 7/27 12. TURP/BPH: On Proscar and Flomax.   Controlled 13. Hyponatremia  Sodium 135 on 7/23  Continue to monitor 14. Acute lower UTI  UA +, urine culture with multiple species  Continue Empiric Macrobid started on 7/24  LOS (Days) 11 A FACE TO FACE EVALUATION WAS PERFORMED  Ankit Karis Juba, MD 06/29/2018 8:36 AM

## 2018-06-29 NOTE — Progress Notes (Signed)
Physical Therapy Session Note  Patient Details  Name: Karl [name] MRN: [id number] Date of Birth: 15-Sep-1956  Today's Date: 06/29/2018 PT Individual Time: 1300-1410 PT Individual Time Calculation (min): 70 min   Short Term Goals: Week 2:  PT Short Term Goal 1 (Week 2): pt will perform functional transfers with min A PT Short Term Goal 2 (Week 2): pt will perform gait with mod A x 50' in controlled environment Week 3:     Skilled Therapeutic Interventions/Progress Updates:   Pt received supine in bed and agreeable to PT. Supine>sit transfer with supervision assist and min cues for posture and safety. Sit<>stand from elevated bed with supervision assist and UE support on RW. Stand pivot transfer to [facility] with min assist from PT with moderate cues for gait pattern. Pt reports need for urination. Supervision assist from PT for clothing management and stand pivot transfer using rail in batroom.   WC mobility x 30f and supervision assist from PT for safety and forced attention to the LUE. Moderate cues for attention to the L and improved equal use of  BUE to maintain straight path.   Gait training with RW and min -mod assist from PT 2x 285f PT required to facilitate right shift to the R to allow improved step length on the L as well as verbal cues for proper step length and heel contact.  Variable gait training in parallel bars: side stepping [id number] Bil, forward/backward 2x [id number] provided min assist throughout with min-mod cues for posture, UE placement and proper step length for the LLE.   [facility] with min assist from PT. nustep reciprocal movement training x 8 minutes, level 4, with min cues from PT throughout for full ROM on the LLE and to attend to the LUE. Pt rates RPE 5/10 upon completion.   Patient returned to room and [facility] in reach and all needs met.         Therapy  Documentation Precautions:  Precautions Precautions: Fall Precaution Comments: poor proprioceptive awareness of Lt  Restrictions Weight Bearing Restrictions: No Pain: 0/10  See Function Navigator for Current Functional Status.   Therapy/Group: Individual Therapy  AustLorie Phenix7/2019, 2:08 PM

## 2018-06-30 ENCOUNTER — Inpatient Hospital Stay (HOSPITAL_COMMUNITY): Payer: Managed Care, Other (non HMO) | Admitting: Occupational Therapy

## 2018-06-30 LAB — PROTIME-INR
INR: 1.73
Prothrombin Time: 20.1 seconds — ABNORMAL HIGH (ref 11.4–15.2)

## 2018-06-30 LAB — GLUCOSE, CAPILLARY
GLUCOSE-CAPILLARY: 113 mg/dL — AB (ref 70–99)
GLUCOSE-CAPILLARY: 191 mg/dL — AB (ref 70–99)
Glucose-Capillary: 128 mg/dL — ABNORMAL HIGH (ref 70–99)
Glucose-Capillary: 219 mg/dL — ABNORMAL HIGH (ref 70–99)

## 2018-06-30 MED ORDER — WARFARIN SODIUM 5 MG PO TABS
10.0000 mg | ORAL_TABLET | Freq: Once | ORAL | Status: AC
  Administered 2018-06-30: 10 mg via ORAL
  Filled 2018-06-30: qty 2

## 2018-06-30 NOTE — Progress Notes (Signed)
Occupational Therapy Session Note  Patient Details  Name: Karl [name] MRN: [id number] Date of Birth: December 03, 1956  Today's Date: 06/30/2018 OT Individual Time: 7867-5449 OT Individual Time Calculation (min): 31 min   Short Term Goals: Week 2:  OT Short Term Goal 1 (Week 2): Pt will maintain standing balance at the sink with no more than CGA while performing grooming task. OT Short Term Goal 2 (Week 2): Pt will complete shower transfer onto bench with no more than CGA  OT Short Term Goal 3 (Week 2): Pt will utilize L UE during bathing tasks with no more than 3 verbal cues OT Short Term Goal 4 (Week 2): Pt will be given home fine motor program  Skilled Therapeutic Interventions/Progress Updates:    Pt greeted supine in bed with no c/o pain. ADL needs met. Stand pivot<w/c completed with steady assist. For remainder of session he worked on L UE coordination via participation in nuts and bolts activity. Pt with a few instances of dropping bolts and washers however able to meet task demands with increased time. At end of session he reported need to void bowels. Stand pivot<toilet completed with steady assist. Per pt "This will be a while." He verbalized that he would call for staff assist for transfer back to w/c when he was done. NT made aware of pts position.   Therapy Documentation Precautions:  Precautions Precautions: Fall Precaution Comments: poor proprioceptive awareness of Lt  Restrictions Weight Bearing Restrictions: No Vital Signs: Therapy Vitals Temp: 98.1 F (36.7 C) Temp Source: Oral Pulse Rate: 80 Resp: 14 BP: 116/79 Oxygen Therapy SpO2: 99 % Pain: No c/o pain during tx    ADL: ADL ADL Comments: Please see functional navigator    See Function Navigator for Current Functional Status.   Therapy/Group: Individual Therapy  Zyana Amaro A Kanisha Duba 06/30/2018, 3:50 PM

## 2018-06-30 NOTE — Plan of Care (Signed)
  Problem: Consults Goal: RH STROKE PATIENT EDUCATION Description See Patient Education module for education specifics  Outcome: Progressing   Problem: RH BOWEL ELIMINATION Goal: RH STG MANAGE BOWEL WITH ASSISTANCE Description STG Manage Bowel with  Min Assistance.   Outcome: Progressing Goal: RH STG MANAGE BOWEL W/MEDICATION W/ASSISTANCE Description STG Manage Bowel with Medication with mod I Assistance.  Outcome: Progressing   Problem: RH SAFETY Goal: RH STG ADHERE TO SAFETY PRECAUTIONS W/ASSISTANCE/DEVICE Description STG Adhere to Safety Precautions With cues, supervision Assistance/Device.  Outcome: Progressing   Problem: RH KNOWLEDGE DEFICIT Goal: RH STG INCREASE KNOWLEDGE OF DIABETES Description Pt and family will be able to explain rationale for and schedule for medications,  CBG checks and dietary restrictions using resources/cues/reminders min assist  Outcome: Progressing Goal: RH STG INCREASE KNOWLEDGE OF HYPERTENSION Description PT will be able to manage HTN with family using resources/cues/reminders/handouts min assist   Outcome: Progressing   

## 2018-06-30 NOTE — Progress Notes (Signed)
[place] PHYSICAL MEDICINE & REHABILITATION     PROGRESS NOTE    Subjective/Complaints: Patient seen lying in bed this morning. He states he slept fairly overnight because he slept in the wrong position and his neck is stiff this AM.  ROS:  Denies CP, SOB, vomiting, diarrhea.  Objective:  No results found. No results for input(s): WBC, HGB, HCT, PLT in the last 72 hours. No results for input(s): NA, K, CL, GLUCOSE, BUN, CREATININE, CALCIUM in the last 72 hours.  Invalid input(s): CO CBG (last 3)  Recent Labs    06/29/18 1647 06/29/18 2131 06/30/18 0617  GLUCAP 136* 131* 128*    Wt Readings from Last 3 Encounters:  06/30/18 90.1 kg (198 lb 10.2 oz)  06/14/18 105.2 kg (232 lb)  06/11/18 105.2 kg (232 lb)     Intake/Output Summary (Last 24 hours) at 06/30/2018 0659 Last data filed at 06/30/2018 0441 Gross per 24 hour  Intake 720 ml  Output 75 ml  Net 645 ml    Vital Signs: Blood pressure 137/85, pulse 74, temperature 98.2 F (36.8 C), temperature source Oral, resp. rate 17, height 6\' 2"  (1.88 m), weight 90.1 kg (198 lb 10.2 oz), SpO2 98 %. Physical Exam:  Constitutional: No distress . Vital signs reviewed. HENT: Normocephalic.  Atraumatic. Eyes: EOMI. No discharge. Cardiovascular: RRR. No JVD. Respiratory: CTA bilaterally. Normal effort. GI: BS +. Non-distended. Musc: No edema or tenderness in extremities. Neurological:  Left central 7.  Mild dysarthria Motor:  5/5 RUE/RLE.  LUE 4-4+/5 prox to distal (stable).  LLE 4+/5 (stable). Psych: normal mood. Normal affect.  Assessment/Plan: 1. Functional and mobility deficits secondary to bilateral posterior circulation infarcts which require 3+ hours per day of interdisciplinary therapy in a comprehensive inpatient rehab setting. Physiatrist is providing close team supervision and 24 hour management of active medical problems listed below. Physiatrist and rehab team continue to assess barriers to  discharge/monitor patient progress toward functional and medical goals.  Function:  Bathing Bathing position Bathing activity did not occur: N/A Position: Shower  Bathing parts Body parts bathed by patient: Right arm, Left arm, Chest, Abdomen, Front perineal area, Right upper leg, Left upper leg, Buttocks, Left lower leg, Right lower leg Body parts bathed by helper: Right lower leg, Left lower leg, Buttocks  Bathing assist Assist Level: Supervision or verbal cues      Upper Body Dressing/Undressing Upper body dressing   What is the patient wearing?: Pull over shirt/dress     Pull over shirt/dress - Perfomed by patient: Thread/unthread right sleeve, Thread/unthread left sleeve, Put head through opening, Pull shirt over trunk          Upper body assist Assist Level: Supervision or verbal cues      Lower Body Dressing/Undressing Lower body dressing Lower body dressing/undressing activity did not occur: N/A What is the patient wearing?: Pants, Underwear, Socks, Shoes Underwear - Performed by patient: Thread/unthread left underwear leg, Pull underwear up/down, Thread/unthread right underwear leg   Pants- Performed by patient: Thread/unthread right pants leg, Thread/unthread left pants leg, Pull pants up/down Pants- Performed by helper: Fasten/unfasten pants Non-skid slipper socks- Performed by patient: Don/doff right sock, Don/doff left sock Non-skid slipper socks- Performed by helper: Don/doff left sock, Don/doff right sock Socks - Performed by patient: Don/doff right sock, Don/doff left sock   Shoes - Performed by patient: Don/doff right shoe, Don/doff left shoe            Lower body assist Assist for lower body dressing:  Touching or steadying assistance (Pt > 75%)      Toileting Toileting   Toileting steps completed by patient: Adjust clothing prior to toileting, Performs perineal hygiene, Adjust clothing after toileting Toileting steps completed by helper: Adjust  clothing after toileting Toileting Assistive Devices: Grab bar or rail  Toileting assist Assist level: Touching or steadying assistance (Pt.75%)   Transfers Chair/bed transfer   Chair/bed transfer method: Stand pivot Chair/bed transfer assist level: Touching or steadying assistance (Pt > 75%) Chair/bed transfer assistive device: Walker, Designer, fashion/clothing     Max distance: 25 Assist level: Moderate assist (Pt 50 - 74%)   Wheelchair   Type: Manual Max wheelchair distance: 75 Assist Level: Supervision or verbal cues  Cognition Comprehension Comprehension assist level: Understands basic 90% of the time/cues < 10% of the time  Expression Expression assist level: Expresses basic 90% of the time/requires cueing < 10% of the time.  Social Interaction Social Interaction assist level: Interacts appropriately 90% of the time - Needs monitoring or encouragement for participation or interaction.  Problem Solving Problem solving assist level: Solves basic 75 - 89% of the time/requires cueing 10 - 24% of the time  Memory Memory assist level: Recognizes or recalls 90% of the time/requires cueing < 10% of the time   Medical Problem List and Plan:  1. 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 DVT Prophylaxis/Anticoagulation: Pharmaceutical: Coumadin   INR subtherapeutic on 7/27, pending for today 3. Pain Management: tylenol prn  4. Mood: LCSW to follow for evaluation and support.  5. Neuropsych: This patient is capable of making decisions on his own behalf.  6. Skin/Wound Care: routine pressure relief measures.  7. Fluids/Electrolytes/Nutrition: Monitor I/O.  BMP within acceptable range on 7/23 8. CAD with ICM: Coumadin with INR goal 2-3. Continue coreg and Crestor.  9.T2DM: Metformin increased to 850mg  bid (PTA 1000 twice a day) CBG (last 3)  Recent Labs     06/29/18 1647 06/29/18 2131 06/30/18 0617  GLUCAP 136* 131* 128*   Overall controlled on 7/28 10. Hypokalemia:  resolved 11. HTN: Monitor BP bid--on coreg bid. Off Vasotec at this time    Vitals:   06/29/18 2008 06/30/18 0441  BP: 132/78 137/85  Pulse: 85 74  Resp: 17 17  Temp: 98.1 F (36.7 C) 98.2 F (36.8 C)  SpO2: 97% 98%   Controlled on 7/28 12. TURP/BPH: On Proscar and Flomax.   Controlled 13. Hyponatremia  Sodium 135 on 7/23  Continue to monitor 14. Acute lower UTI  UA +, urine culture with multiple species  Continue Empiric Macrobid started on 7/24-7/30  LOS (Days) 12 A FACE TO FACE EVALUATION WAS PERFORMED  Ankit Karis Juba, MD 06/30/2018 6:59 AM

## 2018-06-30 NOTE — Progress Notes (Signed)
ANTICOAGULATION CONSULT NOTE - Follow Up Consult  Pharmacy Consult for Coumadin Indication: stroke  Allergies  Allergen Reactions  . Lipitor [Atorvastatin] Nausea And Vomiting    Patient Measurements: Height: 6\' 2"  (188 cm) Weight: 198 lb 10.2 oz (90.1 kg) IBW/kg (Calculated) : 82.2  Vital Signs: Temp: 98.2 F (36.8 C) (07/28 0441) Temp Source: Oral (07/28 0441) BP: 137/85 (07/28 0441) Pulse Rate: 74 (07/28 0441)  Labs: Recent Labs    06/28/18 0600 06/29/18 0458 06/30/18 0731  LABPROT 26.6* 20.6* 20.1*  INR 2.47 1.78 1.73    Estimated Creatinine Clearance: 83.7 mL/min (by C-G formula based on SCr of 1.05 mg/dL).  Assessment: 62 YO M on warfarin PTA for hx CVA- held on admission for a scheduled procedure, per report patient restarted post-procedure, but INR was subtherapeutic on admission. Pt presented to [facility] ED with RLE numbness, dizziness, and speech deficits and noted to have left chronic cerebellar cva and acute occipital lobe CVA. Pharmacy consulted for warfarin dosing.   INR is now subtherapeutic at 1.73.  This is likely due to warfarin being held x 2 days (7/24-7/25) in response to a supratherapeutic INR.     Home warfarin regimen:  10 mg daily  Goal of Therapy:  INR 2-3 Monitor platelets by anticoagulation protocol: Yes   Plan:  Give warfarin 10 mg po x 1 Monitor daily INR, CBC, clinical course, s/sx of bleed, PO intake, DDI  Thank you for allowing [place] to participate in this patients care.   Estella Husk, Pharm.D., BCPS, BCIDP Clinical Pharmacist Phone: [phone] Please check AMION for all [facility] Pharmacy numbers 06/30/2018, 9:42 AM

## 2018-07-01 ENCOUNTER — Inpatient Hospital Stay (HOSPITAL_COMMUNITY): Payer: Managed Care, Other (non HMO) | Admitting: Occupational Therapy

## 2018-07-01 ENCOUNTER — Inpatient Hospital Stay (HOSPITAL_COMMUNITY): Payer: Managed Care, Other (non HMO) | Admitting: Speech Pathology

## 2018-07-01 ENCOUNTER — Inpatient Hospital Stay (HOSPITAL_COMMUNITY): Payer: Managed Care, Other (non HMO) | Admitting: Physical Therapy

## 2018-07-01 LAB — PROTIME-INR
INR: 1.86
PROTHROMBIN TIME: 21.3 s — AB (ref 11.4–15.2)

## 2018-07-01 LAB — GLUCOSE, CAPILLARY
GLUCOSE-CAPILLARY: 138 mg/dL — AB (ref 70–99)
Glucose-Capillary: 154 mg/dL — ABNORMAL HIGH (ref 70–99)
Glucose-Capillary: 138 mg/dL — ABNORMAL HIGH (ref 70–99)
Glucose-Capillary: 141 mg/dL — ABNORMAL HIGH (ref 70–99)

## 2018-07-01 MED ORDER — METFORMIN HCL 500 MG PO TABS
1000.0000 mg | ORAL_TABLET | Freq: Two times a day (BID) | ORAL | Status: DC
  Administered 2018-07-01 – 2018-07-09 (×16): 1000 mg via ORAL
  Filled 2018-07-01 (×16): qty 2

## 2018-07-01 MED ORDER — WARFARIN SODIUM 5 MG PO TABS
10.0000 mg | ORAL_TABLET | Freq: Once | ORAL | Status: AC
  Administered 2018-07-01: 10 mg via ORAL
  Filled 2018-07-01: qty 2

## 2018-07-01 NOTE — Progress Notes (Signed)
Speech Language Pathology Daily Session Note  Patient Details  Name: Karl [name] MRN: [id number] Date of Birth: 05-10-1956  Today's Date: 07/01/2018 SLP Individual Time: 1330-1430 SLP Individual Time Calculation (min): 60 min  Short Term Goals: Week 2: SLP Short Term Goal 1 (Week 2): Patient will demonstrate functional problem solving for mildly complex tasks with supervision verbal cues.  SLP Short Term Goal 2 (Week 2): Patient will attend to left field of enviornment during functional tasks with supervision verbal cues.  SLP Short Term Goal 3 (Week 2): Patient will recall new, daily information with Mod I.  SLP Short Term Goal 4 (Week 2): Patient will consume Dys. 3 textures with efficient mastication without overt s/s of aspiration or globus sensation with Mod I.   Skilled Therapeutic Interventions: Skilled treatment session focused on cognitive goals. SLP facilitated session by providing extra time and Min A verbal cues for problem solving during a mildly complex scheduling task. Patient attended to left field of environment throughout task with Mod I. Patient left upright in bed with alarm on and all needs within reach. Continue with current plan of care.      Function:  Cognition Comprehension Comprehension assist level: Understands basic 90% of the time/cues < 10% of the time  Expression   Expression assist level: Expresses basic 90% of the time/requires cueing < 10% of the time.  Social Interaction Social Interaction assist level: Interacts appropriately 90% of the time - Needs monitoring or encouragement for participation or interaction.  Problem Solving Problem solving assist level: Solves basic 90% of the time/requires cueing < 10% of the time  Memory Memory assist level: Recognizes or recalls 90% of the time/requires cueing < 10% of the time    Pain No/Denies Pain   Therapy/Group: Individual Therapy  Mikyah Alamo 07/01/2018, 3:11 PM

## 2018-07-01 NOTE — Progress Notes (Signed)
ANTICOAGULATION CONSULT NOTE - Follow Up Consult  Pharmacy Consult for Coumadin Indication: stroke  Allergies  Allergen Reactions  . Lipitor [Atorvastatin] Nausea And Vomiting    Patient Measurements: Height: 6\' 2"  (188 cm) Weight: 197 lb 8.5 oz (89.6 kg) IBW/kg (Calculated) : 82.2  Vital Signs: Temp: 98 F (36.7 C) (07/29 0427) Temp Source: Oral (07/29 0427) BP: 138/96 (07/29 0448) Pulse Rate: 70 (07/29 0448)  Labs: Recent Labs    06/29/18 0458 06/30/18 0731 07/01/18 0552  LABPROT 20.6* 20.1* 21.3*  INR 1.78 1.73 1.86    Estimated Creatinine Clearance: 83.7 mL/min (by C-G formula based on SCr of 1.05 mg/dL).  Assessment: 62 YO M on warfarin PTA for hx CVA- held on admission for a scheduled procedure, per report patient restarted post-procedure, but INR was subtherapeutic on admission. 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 Pharmacy consulted for warfarin dosing.   INR is slightly subtherapeutic at 1.86.  This is likely due to warfarin being held x 2 days (7/24-7/25) in response to a supratherapeutic INR.     Home warfarin regimen:  10 mg daily  Goal of Therapy:  INR 2-3 Monitor platelets by anticoagulation protocol: Yes   Plan:  Give warfarin 10 mg po x 1 today. Monitor daily INR, CBC, clinical course, s/sx of bleed, PO intake, DDI  Thank you for allowing [place] to participate in this patients care.   Robyne Askew, RPh Clinical Pharmacist Phone: [phone] Please check AMION for all [facility] Pharmacy numbers 07/01/2018, 8:53 AM

## 2018-07-01 NOTE — Progress Notes (Signed)
Occupational Therapy Session Note  Patient Details  Name: Karl [name] MRN: [id number] Date of Birth: [birth year]  Today's Date: 07/01/2018 OT Individual Time: 0904-1000 OT Individual Time Calculation (min): 56 min    Short Term Goals: Week 2:  OT Short Term Goal 1 (Week 2): Pt will maintain standing balance at the sink with no more than CGA while performing grooming task. OT Short Term Goal 2 (Week 2): Pt will complete shower transfer onto bench with no more than CGA  OT Short Term Goal 3 (Week 2): Pt will utilize L UE during bathing tasks with no more than 3 verbal cues OT Short Term Goal 4 (Week 2): Pt will be given home fine motor program  Skilled Therapeutic Interventions/Progress Updates:    Pt completed bathing and dressing during session.  Min assist for transfer to the toilet and the shower bench with use of the RW and min assist.  Increased ataxia noted and decreased step length with the LLE when ambulating.  Pt also with wide BOS when standing as well.  He completed toileting as well as removing clothing for showering with min guard assist.  Min assist to transfer over to the tub bench from the toilet with use of the rails.  Pt completed all bathing in sitting with supervision.  Lateral lean to the side with supervision to wash his bottom with min guard assist to stand when drying it off.  He transferred out to the sink for dressing with min assist.  He donned all UB clothing with setup and LB clothing with min guard assist.  He did need 3 attempts to tie the left shoe secondary to decreased FM coordination.  Finished session with pt in the wheelchair with call button and phone in reach.    Therapy Documentation Precautions:  Precautions Precautions: Fall Precaution Comments: poor proprioceptive awareness of Lt  Restrictions Weight Bearing Restrictions: No  Pain: Pain Assessment Pain Score: 0-No pain ADL: See Function Navigator for Current Functional  Status.   Therapy/Group: Individual Therapy  Shaunie Boehm OTR/L 07/01/2018, 12:34 PM

## 2018-07-01 NOTE — Progress Notes (Signed)
[facility]     PROGRESS NOTE    Subjective/Complaints: Patient lying in bed.  No new complaints from the weekend.   ROS: Patient denies fever, rash, sore throat, blurred vision, nausea, vomiting, diarrhea, cough, shortness of breath or chest pain, joint or back pain, headache, or mood change.    Objective:  No results found. No results for input(s): WBC, HGB, HCT, PLT in the last 72 hours. No results for input(s): NA, K, CL, GLUCOSE, BUN, CREATININE, CALCIUM in the last 72 hours.  Invalid input(s): CO CBG (last 3)  Recent Labs    06/30/18 1643 06/30/18 2116 07/01/18 0631  GLUCAP 191* 219* 154*    Wt Readings from Last 3 Encounters:  07/01/18 89.6 kg (197 lb 8.5 oz)  06/14/18 105.2 kg (232 lb)  06/11/18 105.2 kg (232 lb)     Intake/Output Summary (Last 24 hours) at 07/01/2018 0941 Last data filed at 07/01/2018 0757 Gross per 24 hour  Intake 700 ml  Output -  Net 700 ml    Vital Signs: Blood pressure (!) 138/96, pulse 70, temperature 98 F (36.7 C), temperature source Oral, resp. rate 17, height 6\' 2"  (1.88 m), weight 89.6 kg (197 lb 8.5 oz), SpO2 99 %. Physical Exam:  General: No acute distress HEENT: EOMI, oral membranes moist Cards: reg rate  Chest: normal effort Abdomen: Soft, NT, ND Skin: dry, intact Extremities: no edema. Neurological:  Left central 7.  Continued decreased coordination in the left arm and leg. Motor:  5/5 RUE/RLE.  LUE 4-4+/5 prox to distal (stable).  LLE 4+/5 (stable). Psych: normal mood. Normal affect.  Assessment/Plan: 1. Functional and mobility deficits secondary to bilateral posterior circulation infarcts which require 3+ hours per day of interdisciplinary therapy in a comprehensive inpatient rehab setting. Physiatrist is providing close team supervision and 24 hour management of active medical problems listed below. Physiatrist and rehab team continue to assess barriers to discharge/monitor  patient progress toward functional and medical goals.  Function:  Bathing Bathing position Bathing activity did not occur: N/A Position: Shower  Bathing parts Body parts bathed by patient: Right arm, Left arm, Chest, Abdomen, Front perineal area, Right upper leg, Left upper leg, Buttocks, Left lower leg, Right lower leg Body parts bathed by helper: Right lower leg, Left lower leg, Buttocks  Bathing assist Assist Level: Supervision or verbal cues      Upper Body Dressing/Undressing Upper body dressing   What is the patient wearing?: Pull over shirt/dress     Pull over shirt/dress - Perfomed by patient: Thread/unthread right sleeve, Thread/unthread left sleeve, Put head through opening, Pull shirt over trunk          Upper body assist Assist Level: Supervision or verbal cues      Lower Body Dressing/Undressing Lower body dressing Lower body dressing/undressing activity did not occur: N/A What is the patient wearing?: Pants, Underwear, Socks, Shoes Underwear - Performed by patient: Thread/unthread left underwear leg, Pull underwear up/down, Thread/unthread right underwear leg   Pants- Performed by patient: Thread/unthread right pants leg, Thread/unthread left pants leg, Pull pants up/down Pants- Performed by helper: Fasten/unfasten pants Non-skid slipper socks- Performed by patient: Don/doff right sock, Don/doff left sock Non-skid slipper socks- Performed by helper: Don/doff left sock, Don/doff right sock Socks - Performed by patient: Don/doff right sock, Don/doff left sock   Shoes - Performed by patient: Don/doff right shoe, Don/doff left shoe            Lower body assist  Assist for lower body dressing: Touching or steadying assistance (Pt > 75%)      Toileting Toileting   Toileting steps completed by patient: Adjust clothing prior to toileting, Performs perineal hygiene, Adjust clothing after toileting Toileting steps completed by helper: Adjust clothing after  toileting Toileting Assistive Devices: Grab bar or rail  Toileting assist Assist level: Touching or steadying assistance (Pt.75%)   Transfers Chair/bed transfer   Chair/bed transfer method: Stand pivot Chair/bed transfer assist level: Touching or steadying assistance (Pt > 75%) Chair/bed transfer assistive device: Walker, Designer, fashion/clothing     Max distance: 25 Assist level: Moderate assist (Pt 50 - 74%)   Wheelchair   Type: Manual Max wheelchair distance: 75 Assist Level: Supervision or verbal cues  Cognition Comprehension Comprehension assist level: Understands basic 90% of the time/cues < 10% of the time  Expression Expression assist level: Expresses basic 90% of the time/requires cueing < 10% of the time.  Social Interaction Social Interaction assist level: Interacts appropriately 90% of the time - Needs monitoring or encouragement for participation or interaction.  Problem Solving Problem solving assist level: Solves basic 75 - 89% of the time/requires cueing 10 - 24% of the time  Memory Memory assist level: Recognizes or recalls 90% of the time/requires cueing < 10% of the time   Medical Problem List and Plan:  1. 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 DVT Prophylaxis/Anticoagulation: Pharmaceutical: Coumadin   INR subtherapeutic today 1.86 3. Pain Management: tylenol prn  4. Mood: LCSW to follow for evaluation and support.  5. Neuropsych: This patient is capable of making decisions on his own behalf.  6. Skin/Wound Care: routine pressure relief measures.  7. Fluids/Electrolytes/Nutrition: Monitor I/O.  BMP within acceptable range   8. CAD with ICM: Coumadin with INR goal 2-3. Continue coreg and Crestor.  9.T2DM: Metformin increased to 850mg  bid (PTA 1000 twice a day) CBG (last 3)  Recent Labs    06/30/18 1643  06/30/18 2116 07/01/18 0631  GLUCAP 191* 219* 154*   Needs better control. Increase to home dose of 1000mg  bid 10. Hypokalemia:  resolved 11. HTN: Monitor BP bid--on coreg bid. Off Vasotec at this time    Vitals:   07/01/18 0427 07/01/18 0448  BP: (!) 153/89 (!) 138/96  Pulse: 74 70  Resp: 17   Temp: 98 F (36.7 C)   SpO2: 99%    Borderline control 7/29 12. TURP/BPH: On Proscar and Flomax.   Controlled 13. Hyponatremia  Sodium 135 on 7/23  Continue to monitor 14. Acute lower UTI  UA +, urine culture with multiple species  Continue Empiric Macrobid  7/24-7/30  LOS (Days) 13 A FACE TO FACE EVALUATION WAS PERFORMED  Ranelle Oyster, MD 07/01/2018 9:41 AM

## 2018-07-01 NOTE — Progress Notes (Signed)
Physical Therapy Session Note  Patient Details  Name: Karl [name] MRN: [id number] Date of Birth: May 08, 1956  Today's Date: 07/01/2018 PT Individual Time: 1030-1125 PT Individual Time Calculation (min): 55 min   Short Term Goals: Week 2:  PT Short Term Goal 1 (Week 2): pt will perform functional transfers with min A PT Short Term Goal 2 (Week 2): pt will perform gait with mod A x 50' in controlled environment  Skilled Therapeutic Interventions/Progress Updates:    gait training with RW with min/mod A, continues to require manual facilitation for increased step length on Lt and Lt anterior hip translation.  Gait with obstacle negotiation focusing on turns with min/mod A due to Lt lean and difficulty clearing Lt foot during turns.  Sidestepping with min/mod A for wt shifts and balance.  Reciprocal taps for balance and wt shifting to promote increased wt shift to Rt with min/mod manual facilitation.  nustep for continued reciprocal movement training x 6 minutes level 5. Pt continues to require min A for squat pivot transfers and cues for technique and motor planning.  Pt left in w/c with alarm set, needs at hand.  Therapy Documentation Precautions:  Precautions Precautions: Fall Precaution Comments: poor proprioceptive awareness of Lt  Restrictions Weight Bearing Restrictions: No Pain:  no c/o pain   Therapy/Group: Individual Therapy  [name] 07/01/2018, 11:25 AM

## 2018-07-02 ENCOUNTER — Inpatient Hospital Stay (HOSPITAL_COMMUNITY): Payer: Managed Care, Other (non HMO) | Admitting: Speech Pathology

## 2018-07-02 ENCOUNTER — Inpatient Hospital Stay (HOSPITAL_COMMUNITY): Payer: Managed Care, Other (non HMO) | Admitting: Occupational Therapy

## 2018-07-02 ENCOUNTER — Inpatient Hospital Stay (HOSPITAL_COMMUNITY): Payer: Managed Care, Other (non HMO)

## 2018-07-02 LAB — BASIC METABOLIC PANEL
Anion gap: 6 (ref 5–15)
BUN: 7 mg/dL — AB (ref 8–23)
CHLORIDE: 105 mmol/L (ref 98–111)
CO2: 26 mmol/L (ref 22–32)
Calcium: 9.1 mg/dL (ref 8.9–10.3)
Creatinine, Ser: 0.97 mg/dL (ref 0.61–1.24)
GFR calc Af Amer: >60 mL/min (ref >60)
GFR calc non Af Amer: >60 mL/min (ref >60)
GLUCOSE: 133 mg/dL — AB (ref 70–99)
POTASSIUM: 3.8 mmol/L (ref 3.5–5.1)
Sodium: 137 mmol/L (ref 135–145)

## 2018-07-02 LAB — CBC
HEMATOCRIT: 38.9 % — AB (ref 39.0–52.0)
Hemoglobin: 12 g/dL — ABNORMAL LOW (ref 13.0–17.0)
MCH: 24 pg — AB (ref 26.0–34.0)
MCHC: 30.8 g/dL (ref 30.0–36.0)
MCV: 77.8 fL — AB (ref 78.0–100.0)
Platelets: 269 10*3/uL (ref 150–400)
RBC: 5 MIL/uL (ref 4.22–5.81)
RDW: 14.2 % (ref 11.5–15.5)
WBC: 5.3 10*3/uL (ref 4.0–10.5)

## 2018-07-02 LAB — GLUCOSE, CAPILLARY
GLUCOSE-CAPILLARY: 119 mg/dL — AB (ref 70–99)
Glucose-Capillary: 150 mg/dL — ABNORMAL HIGH (ref 70–99)
Glucose-Capillary: 116 mg/dL — ABNORMAL HIGH (ref 70–99)

## 2018-07-02 LAB — PROTIME-INR
INR: 2.28
Prothrombin Time: 25 seconds — ABNORMAL HIGH (ref 11.4–15.2)

## 2018-07-02 MED ORDER — WARFARIN SODIUM 5 MG PO TABS
10.0000 mg | ORAL_TABLET | Freq: Once | ORAL | Status: AC
  Administered 2018-07-02: 10 mg via ORAL
  Filled 2018-07-02: qty 2

## 2018-07-02 NOTE — Plan of Care (Signed)
  RD consulted for nutrition education regarding diabetes.   Lab Results  Component Value Date   HGBA1C 6.8 (H) 06/15/2018    RD provided "Carbohydrate Counting for People with Diabetes" handout from the Academy of [employer] Dietetics. Discussed different food groups and their effects on blood sugar, emphasizing carbohydrate-containing foods. Provided list of carbohydrates and recommended serving sizes of common foods.  Discussed importance of controlled and consistent carbohydrate intake throughout the day. Provided examples of ways to balance meals/snacks and encouraged intake of high-fiber, whole grain complex carbohydrates. Discussed diabetic friendly drink options.Teach back method used.  Expect good compliance.  Body mass index is 25.45 kg/m. Pt meets criteria for overweight based on current BMI.  Current diet order is Dysphagia 3 with thin liquids carbohydrate modified diet, patient is consuming approximately 75-100% of meals at this time. Noted pt with numerous missing teeth, however reports no difficulties chewing his food at meals. Labs and medications reviewed. Noted premier protein ordered, however pt refusing them. RD to discontinue the orders. No further nutrition interventions warranted at this time. RD contact information provided. If additional nutrition issues arise, please re-consult RD.  Roslyn Smiling, MS, RD, LDN Pager # [phone] After hours/ weekend pager # [phone]

## 2018-07-02 NOTE — Progress Notes (Signed)
Physical Therapy Weekly Progress Note  Patient Details  Name: Karl [name] MRN: [id number] Date of Birth: [birth year]  Beginning of progress report period: June 26, 2018 End of progress report period: July 02, 2018   Patient has met 2 of 2 short term goals.  Pt currently min A with transfers and gait with RW.  Pt continues to require occasional mod A for balance and cues for Lt inattention.  Patient continues to demonstrate the following deficits muscle weakness, abnormal tone, unbalanced muscle activation, decreased coordination and decreased motor planning, decreased attention, decreased awareness, decreased problem solving, decreased safety awareness and delayed processing and decreased standing balance, decreased postural control, hemiplegia and decreased balance strategies and therefore will continue to benefit from skilled PT intervention to increase functional independence with mobility.  Patient progressing toward long term goals..  Continue plan of care.  PT Short Term Goals Week 2:  PT Short Term Goal 1 (Week 2): pt will perform functional transfers with min A PT Short Term Goal 1 - Progress (Week 2): Met PT Short Term Goal 2 (Week 2): pt will perform gait with mod A x 50' in controlled environment PT Short Term Goal 2 - Progress (Week 2): Met Week 3:  PT Short Term Goal 1 (Week 3): = LTG  Skilled Therapeutic Interventions/Progress Updates:  Ambulation/gait training;Balance/vestibular training;Discharge planning;Community reintegration;Cognitive remediation/compensation;Disease Editor, commissioning;Functional electrical stimulation;Functional mobility training;Neuromuscular re-education;Patient/family education;Pain management;Psychosocial support;Skin care/wound management;Stair training;Splinting/orthotics;Therapeutic Activities;Therapeutic Exercise;UE/LE Coordination activities;UE/LE Strength taining/ROM;Visual/perceptual  remediation/compensation;Wheelchair propulsion/positioning    DONAWERTH,KAREN 07/02/2018, 1:43 PM

## 2018-07-02 NOTE — Progress Notes (Signed)
Speech Language Pathology Daily Session Note  Patient Details  Name: Karl [name] MRN: [id number] Date of Birth: 11-08-1956  Today's Date: 07/02/2018 SLP Individual Time: 9758-8325 SLP Individual Time Calculation (min): 55 min  Short Term Goals: Week 2: SLP Short Term Goal 1 (Week 2): Patient will demonstrate functional problem solving for mildly complex tasks with supervision verbal cues.  SLP Short Term Goal 2 (Week 2): Patient will attend to left field of enviornment during functional tasks with supervision verbal cues.  SLP Short Term Goal 3 (Week 2): Patient will recall new, daily information with Mod I.  SLP Short Term Goal 4 (Week 2): Patient will consume Dys. 3 textures with efficient mastication without overt s/s of aspiration or globus sensation with Mod I.   Skilled Therapeutic Interventions: Skilled treatment session focused on cognitive goals. SLP facilitated session by generating a list of items that patient needed to locate within the gift shop. The patient independently requested to utilize the memory strategy of writing the items down in order to maximize delayed recall. While at the gift shop, the patient independently utilized his list and recalled/located all items with Mod I. Patient also recalled events from previous therapy session and details in regards to conversation with the physician with supervision verbal cues. Patient left upright in wheelchair with alarm on and all needs within reach. Continue with current plan of care.      Function:   Cognition Comprehension Comprehension assist level: Understands basic 90% of the time/cues < 10% of the time  Expression   Expression assist level: Expresses basic 90% of the time/requires cueing < 10% of the time.  Social Interaction Social Interaction assist level: Interacts appropriately 90% of the time - Needs monitoring or encouragement for participation or interaction.  Problem Solving Problem solving assist level:  Solves basic 90% of the time/requires cueing < 10% of the time  Memory Memory assist level: Recognizes or recalls 90% of the time/requires cueing < 10% of the time    Pain Pain Assessment Pain Scale: 0-10 Pain Score: 0-No pain  Therapy/Group: Individual Therapy  Patton Swisher 07/02/2018, 12:47 PM

## 2018-07-02 NOTE — Progress Notes (Signed)
Occupational Therapy Weekly Progress Note  Patient Details  Name: Karl [name] MRN: [id number] Date of Birth: 1956/06/03  Beginning of progress report period: June 19, 2018 End of progress report period: July 02, 2018  Today's Date: 07/02/2018 OT Individual Time: 1300-1430 OT Individual Time Calculation (min): 90 min   Patient has met 4 of 4 short term goals.  Pt is making steady progress towards OT goals at this time. He has demonstrated improved functional use of L UE and is incorporating it within BADL tasks with only a few verbal cues. Pt has also demonstrated improved balance with overall CGA and has progressed to CGA for most functional transfers. Continue current POC.  Patient continues to demonstrate the following deficits: muscle weakness, impaired timing and sequencing, unbalanced muscle activation, ataxia, decreased coordination and decreased motor planning, decreased midline orientation and decreased attention to left, decreased initiation, decreased attention, decreased awareness, decreased problem solving, decreased safety awareness and decreased memory and decreased standing balance and decreased balance strategies and therefore will continue to benefit from skilled OT intervention to enhance overall performance with BADL.  Patient progressing toward long term goals..  Continue plan of care.  OT Short Term Goals Week 2:  OT Short Term Goal 1 (Week 2): Pt will maintain standing balance at the sink with no more than CGA while performing grooming task. OT Short Term Goal 1 - Progress (Week 2): Met OT Short Term Goal 2 (Week 2): Pt will complete shower transfer onto bench with no more than CGA  OT Short Term Goal 2 - Progress (Week 2): Met OT Short Term Goal 3 (Week 2): Pt will utilize L UE during bathing tasks with no more than 3 verbal cues OT Short Term Goal 3 - Progress (Week 2): Met OT Short Term Goal 4 (Week 2): Pt will be given home fine motor program OT Short Term Goal 4  - Progress (Week 2): Met Week 3:  OT Short Term Goal 1 (Week 3): STG=LTG 2/2 ELOS  Skilled Therapeutic Interventions/Progress Updates:    Pt greeted semi-reclined in bed and agreeable to OT treatment session. Pt requests to shower this afternoon. Stand-pivot bed>wc>tub bench with overall close supervision/CGA. Pt able to wash 10/10 body parts with set-up A and utilized leaning method to wash bottom. Min verbal cues to integrate L UE into bathing tasks for neuro re-ed. Also incorporated B UE coordination with wringing out wash cloths and drying the wall using L hand. Dressing completed wc at the sink with supervision to thread pant legs and CGA for balance when standing to pull up pants. Pt needed facilitation for full hip/trunk extension in standing. Verbal cues to alternate UE's for balance when pulling up pants. Also incorporated forced use of L UE throughout. Pt brought to tub room and practiced tub bench transfer in simulated home environment. Pt needed min A to transfer to the L with increased time to motor plan and coordinate pivot. Pt then able to swing LEs into tub with verbal cues. CGA to transfer back to wc on R side. Worked on trunk elongation/shortening seated on therapy mat with reaching activity. OT provided facilitation using NDT techniques. Pt returned to room at end of session and left seated in wc with alarm belt on and needs met.   Therapy Documentation Precautions:  Precautions Precautions: Fall Precaution Comments: poor proprioceptive awareness of Lt  Restrictions Weight Bearing Restrictions: No Pain:  none/denies pain ADL: ADL ADL Comments: Please see functional navigator  See Function Navigator for  Current Functional Status.   Therapy/Group: Individual Therapy  Valma Cava 07/02/2018, 1:30 PM

## 2018-07-02 NOTE — Progress Notes (Signed)
ANTICOAGULATION CONSULT NOTE - Follow Up Consult  Pharmacy Consult for Coumadin Indication: stroke  Allergies  Allergen Reactions  . Lipitor [Atorvastatin] Nausea And Vomiting    Patient Measurements: Height: 6\' 2"  (188 cm) Weight: 198 lb 3.1 oz (89.9 kg) IBW/kg (Calculated) : 82.2  Vital Signs: Temp: 98.5 F (36.9 C) (07/30 0357) Temp Source: Oral (07/30 0357) BP: 142/87 (07/30 0357) Pulse Rate: 76 (07/30 0357)  Labs: Recent Labs    06/30/18 0731 07/01/18 0552 07/02/18 0526  HGB  --   --  12.0*  HCT  --   --  38.9*  PLT  --   --  269  LABPROT 20.1* 21.3* 25.0*  INR 1.73 1.86 2.28  CREATININE  --   --  0.97    Estimated Creatinine Clearance: 90.6 mL/min (by C-G formula based on SCr of 0.97 mg/dL).  Assessment: 62 YO M on warfarin PTA for hx CVA- held on admission for a scheduled procedure, per report patient restarted post-procedure, but INR was subtherapeutic on admission. Pt presented to [facility] ED with RLE numbness, dizziness, and speech deficits and noted to have left chronic cerebellar cva and acute occipital lobe CVA. Pharmacy consulted for warfarin dosing.   INR is therapeutic, 2.28.    Home warfarin regimen:  10 mg daily  Goal of Therapy:  INR 2-3 Monitor platelets by anticoagulation protocol: Yes   Plan:  Give warfarin 10 mg po x1 today. Monitor daily INR, CBC, clinical course, s/sx of bleed, PO intake, DDI  Thank you for allowing [place] to participate in this patients care.   Robyne Askew, RPh Clinical Pharmacist Phone: [phone] Please check AMION for all [facility] Pharmacy numbers 07/02/2018, 9:31 AM

## 2018-07-02 NOTE — Progress Notes (Signed)
[place] PHYSICAL MEDICINE & REHABILITATION     PROGRESS NOTE    Subjective/Complaints: No new complaints. Feels well  ROS: Patient denies fever, rash, sore throat, blurred vision, nausea, vomiting, diarrhea, cough, shortness of breath or chest pain, joint or back pain, headache, or mood change.     Objective:  No results found. Recent Labs    07/02/18 0526  WBC 5.3  HGB 12.0*  HCT 38.9*  PLT 269   Recent Labs    07/02/18 0526  NA 137  K 3.8  CL 105  GLUCOSE 133*  BUN 7*  CREATININE 0.97  CALCIUM 9.1   CBG (last 3)  Recent Labs    07/01/18 1624 07/01/18 2126 07/02/18 0620  GLUCAP 138* 141* 119*    Wt Readings from Last 3 Encounters:  07/02/18 89.9 kg (198 lb 3.1 oz)  06/14/18 105.2 kg (232 lb)  06/11/18 105.2 kg (232 lb)     Intake/Output Summary (Last 24 hours) at 07/02/2018 0834 Last data filed at 07/02/2018 0731 Gross per 24 hour  Intake 460 ml  Output -  Net 460 ml    Vital Signs: Blood pressure (!) 142/87, pulse 76, temperature 98.5 F (36.9 C), temperature source Oral, resp. rate 18, height 6\' 2"  (1.88 m), weight 89.9 kg (198 lb 3.1 oz), SpO2 97 %. Physical Exam:  Constitutional: No distress . Vital signs reviewed. HEENT: EOMI, oral membranes moist Neck: supple Cardiovascular: RRR without murmur. No JVD    Respiratory: CTA Bilaterally without wheezes or rales. Normal effort    GI: BS +, non-tender, non-distended  Extremities: no edema. Neurological:  Left central 7.  Continued decreased coordination in the left arm and leg. Motor:  5/5 RUE/RLE.  LUE 4-4+/5 prox to distal (stable).  LLE 4+/5 (stable). Psych: normal mood. Normal affect.  Assessment/Plan: 1. Functional and mobility deficits secondary to bilateral posterior circulation infarcts which require 3+ hours per day of interdisciplinary therapy in a comprehensive inpatient rehab setting. Physiatrist is providing close team supervision and 24 hour management of active medical  problems listed below. Physiatrist and rehab team continue to assess barriers to discharge/monitor patient progress toward functional and medical goals.  Function:  Bathing Bathing position Bathing activity did not occur: N/A Position: Shower  Bathing parts Body parts bathed by patient: Right arm, Left arm, Chest, Abdomen, Front perineal area, Right upper leg, Left upper leg, Buttocks, Left lower leg, Right lower leg Body parts bathed by helper: Right lower leg, Left lower leg, Buttocks  Bathing assist Assist Level: Touching or steadying assistance(Pt > 75%)      Upper Body Dressing/Undressing Upper body dressing   What is the patient wearing?: Pull over shirt/dress     Pull over shirt/dress - Perfomed by patient: Thread/unthread right sleeve, Thread/unthread left sleeve, Put head through opening, Pull shirt over trunk          Upper body assist Assist Level: Supervision or verbal cues      Lower Body Dressing/Undressing Lower body dressing Lower body dressing/undressing activity did not occur: N/A What is the patient wearing?: Pants, Underwear, Socks, Shoes Underwear - Performed by patient: Thread/unthread left underwear leg, Pull underwear up/down, Thread/unthread right underwear leg   Pants- Performed by patient: Thread/unthread right pants leg, Thread/unthread left pants leg, Pull pants up/down Pants- Performed by helper: Fasten/unfasten pants Non-skid slipper socks- Performed by patient: Don/doff right sock, Don/doff left sock Non-skid slipper socks- Performed by helper: Don/doff left sock, Don/doff right sock Socks - Performed by patient: Don/doff  right sock, Don/doff left sock   Shoes - Performed by patient: Don/doff right shoe, Don/doff left shoe, Fasten right, Fasten left            Lower body assist Assist for lower body dressing: Touching or steadying assistance (Pt > 75%)      Toileting Toileting   Toileting steps completed by patient: Adjust clothing prior  to toileting, Performs perineal hygiene, Adjust clothing after toileting Toileting steps completed by helper: Adjust clothing after toileting Toileting Assistive Devices: Grab bar or rail  Toileting assist Assist level: Touching or steadying assistance (Pt.75%)   Transfers Chair/bed transfer   Chair/bed transfer method: Stand pivot Chair/bed transfer assist level: Touching or steadying assistance (Pt > 75%) Chair/bed transfer assistive device: Walker, Designer, fashion/clothing     Max distance: 25 Assist level: Moderate assist (Pt 50 - 74%)   Wheelchair   Type: Manual Max wheelchair distance: 75 Assist Level: Supervision or verbal cues  Cognition Comprehension Comprehension assist level: Understands basic 90% of the time/cues < 10% of the time  Expression Expression assist level: Expresses basic 90% of the time/requires cueing < 10% of the time.  Social Interaction Social Interaction assist level: Interacts appropriately 90% of the time - Needs monitoring or encouragement for participation or interaction.  Problem Solving Problem solving assist level: Solves basic 90% of the time/requires cueing < 10% of the time  Memory Memory assist level: Recognizes or recalls 90% of the time/requires cueing < 10% of the time   Medical Problem List and Plan:  1. 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 DVT Prophylaxis/Anticoagulation: Pharmaceutical: Coumadin   INR therapeutic today   3. Pain Management: tylenol prn  4. Mood: LCSW to follow for evaluation and support.  5. Neuropsych: This patient is capable of making decisions on his own behalf.  6. Skin/Wound Care: routine pressure relief measures.  7. Fluids/Electrolytes/Nutrition: Monitor I/O.  BMP within acceptable range today  8. CAD with ICM: Coumadin with INR goal 2-3. Continue coreg and Crestor.   9.T2DM: Metformin increased to 850mg  bid (PTA 1000 twice a day) CBG (last 3)  Recent Labs    07/01/18 1624 07/01/18 2126 07/02/18 0620  GLUCAP 138* 141* 119*   Increased metformin to home dose of 1000mg  bid  -request nutrition consult  10. Hypokalemia:  resolved 11. HTN: Monitor BP bid--on coreg bid. Off Vasotec at this time    Vitals:   07/01/18 1951 07/02/18 0357  BP: 123/75 (!) 142/87  Pulse: 72 76  Resp: 18 18  Temp: 98.6 F (37 C) 98.5 F (36.9 C)  SpO2: 99% 97%   Improved control 12. TURP/BPH: On Proscar and Flomax.   Controlled 13. Hyponatremia  Sodium 137 on 7/29  Continue to monitor 14. Acute lower UTI  UA +, urine culture with multiple species  Continue Empiric Macrobid  7/24-7/30  LOS (Days) 14 A FACE TO FACE EVALUATION WAS PERFORMED  Ranelle Oyster, MD 07/02/2018 8:34 AM

## 2018-07-02 NOTE — Progress Notes (Signed)
Physical Therapy Session Note  Patient Details  Name: Karl [name] MRN: [id number] Date of Birth: 1956/06/07  Today's Date: 07/02/2018 PT Individual Time: 0900-0958 PT Individual Time Calculation (min): 58 min   Short Term Goals: Week 2:  PT Short Term Goal 1 (Week 2): pt will perform functional transfers with min A PT Short Term Goal 2 (Week 2): pt will perform gait with mod A x 50' in controlled environment  Skilled Therapeutic Interventions/Progress Updates:    Pt seated in w/c upon PT arrival, agreeable to therapy tx and denies pain. Pt seated in w/c doffed grip socks and donned socks and shoes with supervision and increased time to complete, working on fine motor coordination of L hand. Pt propelled w/c to the gym using B LEs and B UEs with supervision and verbal cues for attention to L side. Pt ambulated x 32 ft and x 60 ft with RW and min assist, decreased step length, decreased gait speed and wide BOS, verbal cues for increased step length. Pt requiring mod assist turning turn to the left to sit secondary to left lateral loss of balance. Pt performed 2 x 5 sit<>stands from elevated mat without UE support for emphasis on LE strengthening and eccentric control with sitting, min assist. Pt ambulated back to w/c x 30 ft with RW and min assist, verbal cues for increased step length. Pt transported back to room and left seated with needs in reach and chair alarm set.   Therapy Documentation Precautions:  Precautions Precautions: Fall Precaution Comments: poor proprioceptive awareness of Lt  Restrictions Weight Bearing Restrictions: No   See Function Navigator for Current Functional Status.   Therapy/Group: Individual Therapy  Cresenciano Genre, PT, DPT 07/02/2018, 9:20 AM

## 2018-07-03 ENCOUNTER — Encounter (HOSPITAL_COMMUNITY): Payer: Managed Care, Other (non HMO) | Admitting: *Deleted

## 2018-07-03 ENCOUNTER — Inpatient Hospital Stay (HOSPITAL_COMMUNITY): Payer: Managed Care, Other (non HMO) | Admitting: Occupational Therapy

## 2018-07-03 ENCOUNTER — Inpatient Hospital Stay (HOSPITAL_COMMUNITY): Payer: Managed Care, Other (non HMO) | Admitting: Speech Pathology

## 2018-07-03 ENCOUNTER — Ambulatory Visit (HOSPITAL_COMMUNITY): Payer: Managed Care, Other (non HMO) | Admitting: Occupational Therapy

## 2018-07-03 ENCOUNTER — Ambulatory Visit (HOSPITAL_COMMUNITY): Payer: Managed Care, Other (non HMO) | Admitting: *Deleted

## 2018-07-03 LAB — GLUCOSE, CAPILLARY
Glucose-Capillary: 170 mg/dL — ABNORMAL HIGH (ref 70–99)
Glucose-Capillary: 123 mg/dL — ABNORMAL HIGH (ref 70–99)
Glucose-Capillary: 160 mg/dL — ABNORMAL HIGH (ref 70–99)
Glucose-Capillary: 125 mg/dL — ABNORMAL HIGH (ref 70–99)
Glucose-Capillary: 126 mg/dL — ABNORMAL HIGH (ref 70–99)

## 2018-07-03 LAB — PROTIME-INR
INR: 2.81
PROTHROMBIN TIME: 29.4 s — AB (ref 11.4–15.2)

## 2018-07-03 MED ORDER — WARFARIN SODIUM 5 MG PO TABS
5.0000 mg | ORAL_TABLET | Freq: Once | ORAL | Status: AC
  Administered 2018-07-03: 5 mg via ORAL
  Filled 2018-07-03: qty 1

## 2018-07-03 NOTE — Progress Notes (Signed)
[place] PHYSICAL MEDICINE & REHABILITATION     PROGRESS NOTE    Subjective/Complaints: Working with SLP on swallowing of pills  ROS: Patient denies fever, rash, sore throat, blurred vision, nausea, vomiting, diarrhea, cough, shortness of breath or chest pain, joint or back pain, headache, or mood change.      Objective:  No results found. Recent Labs    07/02/18 0526  WBC 5.3  HGB 12.0*  HCT 38.9*  PLT 269   Recent Labs    07/02/18 0526  NA 137  K 3.8  CL 105  GLUCOSE 133*  BUN 7*  CREATININE 0.97  CALCIUM 9.1   CBG (last 3)  Recent Labs    07/02/18 1646 07/02/18 2219 07/03/18 0643  GLUCAP 116* 170* 123*    Wt Readings from Last 3 Encounters:  07/03/18 90.6 kg (199 lb 11.8 oz)  06/14/18 105.2 kg (232 lb)  06/11/18 105.2 kg (232 lb)     Intake/Output Summary (Last 24 hours) at 07/03/2018 0850 Last data filed at 07/02/2018 1700 Gross per 24 hour  Intake 340 ml  Output -  Net 340 ml    Vital Signs: Blood pressure 132/80, pulse 78, temperature 98 F (36.7 C), temperature source Oral, resp. rate 16, height 6\' 2"  (1.88 m), weight 90.6 kg (199 lb 11.8 oz), SpO2 97 %. Physical Exam:  Constitutional: No distress . Vital signs reviewed. HEENT: EOMI, oral membranes moist Neck: supple Cardiovascular: RRR without murmur. No JVD    Respiratory: CTA Bilaterally without wheezes or rales. Normal effort    GI: BS +, non-tender, non-distended  Extremities: no edema. Neurological:  Left central 7.  Continued decreased coordination in the left arm and leg. Motor:  5/5 RUE/RLE.  LUE 4-4+/5 prox to distal (stable).  LLE 4+/5 (stable). Psych: normal mood. Normal affect.  Assessment/Plan: 1. Functional and mobility deficits secondary to bilateral posterior circulation infarcts which require 3+ hours per day of interdisciplinary therapy in a comprehensive inpatient rehab setting. Physiatrist is providing close team supervision and 24 hour management of active  medical problems listed below. Physiatrist and rehab team continue to assess barriers to discharge/monitor patient progress toward functional and medical goals.  Function:  Bathing Bathing position Bathing activity did not occur: N/A Position: Shower  Bathing parts Body parts bathed by patient: Right arm, Left arm, Chest, Abdomen, Front perineal area, Right upper leg, Left upper leg, Buttocks, Left lower leg, Right lower leg Body parts bathed by helper: Right lower leg, Left lower leg, Buttocks  Bathing assist Assist Level: Touching or steadying assistance(Pt > 75%)      Upper Body Dressing/Undressing Upper body dressing   What is the patient wearing?: Pull over shirt/dress     Pull over shirt/dress - Perfomed by patient: Thread/unthread right sleeve, Thread/unthread left sleeve, Put head through opening, Pull shirt over trunk          Upper body assist Assist Level: Supervision or verbal cues      Lower Body Dressing/Undressing Lower body dressing Lower body dressing/undressing activity did not occur: N/A What is the patient wearing?: Pants, Underwear, Socks, Shoes Underwear - Performed by patient: Thread/unthread left underwear leg, Pull underwear up/down, Thread/unthread right underwear leg   Pants- Performed by patient: Thread/unthread right pants leg, Thread/unthread left pants leg, Pull pants up/down Pants- Performed by helper: Fasten/unfasten pants Non-skid slipper socks- Performed by patient: Don/doff right sock, Don/doff left sock Non-skid slipper socks- Performed by helper: Don/doff left sock, Don/doff right sock Socks - Performed by  patient: Don/doff right sock, Don/doff left sock   Shoes - Performed by patient: Don/doff right shoe, Don/doff left shoe, Fasten right, Fasten left            Lower body assist Assist for lower body dressing: Touching or steadying assistance (Pt > 75%)      Toileting Toileting   Toileting steps completed by patient: Adjust  clothing prior to toileting, Performs perineal hygiene, Adjust clothing after toileting Toileting steps completed by helper: Adjust clothing after toileting Toileting Assistive Devices: Grab bar or rail  Toileting assist Assist level: Touching or steadying assistance (Pt.75%)   Transfers Chair/bed transfer   Chair/bed transfer method: Stand pivot Chair/bed transfer assist level: Touching or steadying assistance (Pt > 75%) Chair/bed transfer assistive device: Walker, Designer, fashion/clothing     Max distance: 65 ft Assist level: Touching or steadying assistance (Pt > 75%)   Wheelchair   Type: Manual Max wheelchair distance: 75 Assist Level: Supervision or verbal cues  Cognition Comprehension Comprehension assist level: Understands basic 90% of the time/cues < 10% of the time  Expression Expression assist level: Expresses basic 90% of the time/requires cueing < 10% of the time.  Social Interaction Social Interaction assist level: Interacts appropriately 90% of the time - Needs monitoring or encouragement for participation or interaction.  Problem Solving Problem solving assist level: Solves basic 90% of the time/requires cueing < 10% of the time  Memory Memory assist level: Recognizes or recalls 90% of the time/requires cueing < 10% of the time   Medical Problem List and Plan:  1. 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 DVT Prophylaxis/Anticoagulation: Pharmaceutical: Coumadin      3. Pain Management: tylenol prn  4. Mood: LCSW to follow for evaluation and support.  5. Neuropsych: This patient is capable of making decisions on his own behalf.  6. Skin/Wound Care: routine pressure relief measures.  7. Fluids/Electrolytes/Nutrition: Monitor I/O.  BMP within acceptable range   8. CAD with ICM: Coumadin with INR goal 2-3. Continue coreg and  Crestor.  9.T2DM: Metformin increased to 850mg  bid (PTA 1000 twice a day) CBG (last 3)  Recent Labs    07/02/18 1646 07/02/18 2219 07/03/18 0643  GLUCAP 116* 170* 123*   Increased metformin to home dose of 1000mg  bid  -appreciate nutrition consult   -pt took DM recommendations to heart!! 10. Hypokalemia:  resolved 11. HTN: Monitor BP bid--on coreg bid. Off Vasotec at this time    Vitals:   07/02/18 1956 07/03/18 0525  BP: 124/74 132/80  Pulse: 79 78  Resp: 16 16  Temp: 98.6 F (37 C) 98 F (36.7 C)  SpO2: 98% 97%   Improved control 12. TURP/BPH: On Proscar and Flomax.   Controlled 13. Hyponatremia  Sodium 137 on 7/29  Continue to monitor 14. Acute lower UTI  UA +, urine culture with multiple species  Continue Empiric Macrobid  7/24-7/30  LOS (Days) 15 A FACE TO FACE EVALUATION WAS PERFORMED  Ranelle Oyster, MD 07/03/2018 8:50 AM

## 2018-07-03 NOTE — Progress Notes (Signed)
Speech Language Pathology Weekly Progress and Session Note  Patient Details  Name: Karl [name] MRN: [id number] Date of Birth: [birth year]  Beginning of progress report period: June 26, 2018 End of progress report period: July 03, 2018  Today's Date: 07/03/2018 SLP Individual Time: 0730-0825 SLP Individual Time Calculation (min): 55 min  Short Term Goals: Week 2: SLP Short Term Goal 1 (Week 2): Patient will demonstrate functional problem solving for mildly complex tasks with supervision verbal cues.  SLP Short Term Goal 1 - Progress (Week 2): Not met SLP Short Term Goal 2 (Week 2): Patient will attend to left field of enviornment during functional tasks with supervision verbal cues.  SLP Short Term Goal 2 - Progress (Week 2): Met SLP Short Term Goal 3 (Week 2): Patient will recall new, daily information with Mod I.  SLP Short Term Goal 3 - Progress (Week 2): Not met SLP Short Term Goal 4 (Week 2): Patient will consume Dys. 3 textures with efficient mastication without overt s/s of aspiration or globus sensation with Mod I.  SLP Short Term Goal 4 - Progress (Week 2): Met    New Short Term Goals: Week 3: SLP Short Term Goal 1 (Week 3): STGs=LTGs  Weekly Progress Updates: Patient continues to make functional gains and has met 2 of 4 STGs this reporting period. Currently, patient is consuming Dys. 3 textures with thin liquids without overt s/s of aspiration and Mod I for use of swallowing compensatory strategies. Patient demonstrates improved attention to left field of environment but requires extra time for motor planning/coordination with LUE and LLE during tasks. Patient also requires overall supervision-Min A verbal cues for complex problem solving and recall of new information. Patient education ongoing. Patient would benefit from continued skilled SLP intervention to maximize his cognitive function and overall functional independence prior to discharge home.      Intensity: Minumum of  1-2 x/day, 30 to 90 minutes Frequency: 3 to 5 out of 7 days Duration/Length of Stay: 8/6 Treatment/Interventions: Cognitive remediation/compensation;Cueing hierarchy;Environmental controls;Functional tasks;Patient/family education;Therapeutic Activities;Internal/external aids;Dysphagia/aspiration precaution training   Daily Session  Skilled Therapeutic Interventions: Skilled treatment session focused on dysphagia and cognitive goals. SLP facilitated session by provided by providing skilled observation with breakfast meal of Dys. 3 textures with thin liquids and trials of medications whole with thin. Patient consumed both his meal and medications without overt s/s of aspiration and Mod I. Therefore, recommend patient upgrade to taking medications whole with thin. SLP also facilitated session by providing Mod I for basic problem solving with a basic money management task. Patient left upright in bed with alarm on and all needs within reach. Continue with current plan of care.      Function:   Eating Eating   Modified Consistency Diet: Yes Eating Assist Level: Swallowing techniques: self managed;Set up assist for   Eating Set Up Assist For: Opening containers       Cognition Comprehension Comprehension assist level: Understands basic 90% of the time/cues < 10% of the time  Expression   Expression assist level: Expresses basic 90% of the time/requires cueing < 10% of the time.  Social Interaction Social Interaction assist level: Interacts appropriately 90% of the time - Needs monitoring or encouragement for participation or interaction.  Problem Solving Problem solving assist level: Solves basic 90% of the time/requires cueing < 10% of the time  Memory Memory assist level: Recognizes or recalls 90% of the time/requires cueing < 10% of the time   Pain No/Denies Pain  Therapy/Group: Individual Therapy  Karl [name] 07/03/2018, 8:27 AM

## 2018-07-03 NOTE — Patient Care Conference (Signed)
Inpatient RehabilitationTeam Conference and Plan of Care Update Date: 07/02/2018   Time:     Patient Name: Karl [name]      Medical Record Number: [id number]  Date of Birth: 05-04-1956 Sex: Male         Room/Bed: 4W03C/4W03C-01 Payor Info: Payor: [employer] / Plan: CIGNA MANAGED / Product Type: *No Product type* /    Admitting Diagnosis: Bilateal CVA  Admit Date/Time:  06/18/2018  6:14 PM Admission Comments: No comment available   Primary Diagnosis:  Acute ischemic right PCA stroke (HCC) Principal Problem: Acute ischemic right PCA stroke [facility])  Patient Active Problem List   Diagnosis Date Noted  . Labile blood pressure   . Subtherapeutic international normalized ratio (INR)   . Chronic anticoagulation   . Acute lower UTI   . Labile blood glucose   . Hyponatremia   . Hypokalemia   . Supratherapeutic INR   . Acute ischemic right PCA stroke (HCC) 06/18/2018  . Ischemic cardiomyopathy   . CVA (cerebral vascular accident) (HCC) 06/14/2018  . Urinary retention 06/05/2018  . Encounter for therapeutic drug monitoring 02/08/2018  . History of CVA (cerebrovascular accident) 12/20/2017  . Coronary artery disease 12/19/2017  . Mild renal insufficiency 12/19/2017  . TIA (transient ischemic attack) 12/19/2017  . Hip flexor tendinitis 05/22/2017  . Abscess of back 05/22/2017  . Pain of both hip joints 04/25/2017  . Dyslipidemia 09/01/2015  . BPH without urinary obstruction 09/01/2015  . DM (diabetes mellitus), type 2, uncontrolled, periph vascular complic (HCC) 07/10/2014  . Essential hypertension 04/20/2009  . CARDIOMYOPATHY, ISCHEMIC 04/20/2009  . LEFT BUNDLE BRANCH BLOCK 04/20/2009  . Chronic systolic heart failure (HCC) 04/20/2009    Expected Discharge Date: Expected Discharge Date: 07/09/18  Team Members Present: Physician leading conference: Dr. Faith Rogue Social Worker Present: Amada Jupiter, LCSW Nurse Present: Vincente Poli, RN PT Present: Judieth Keens, PT OT  Present: Kearney Hard, OT SLP Present: Feliberto Gottron, SLP PPS Coordinator present : Tora Duck, RN, CRRN     Current Status/Progress Goal Weekly Team Focus  Medical   pt making gains with coordination Left upper and lower ext. diabetes needs better control, pt needs dm ed  continue to rx dm,bp,   finalize medical mgt plan for discharge   Bowel/Bladder   Patient is continent of bowel and bladder; LBM 06/29/2018  Remain continent   assess bowel and bladder needs q shift and PRN   Swallow/Nutrition/ Hydration   Dys. 3 textures with thin liquids, Mod I  Mod I  tolerance of upgrade    ADL's   CGA/min A overall  Supervision  balance, activity tolerance, L NMR, L attention, modified bathing/dressing, dc planning, pt/family ed   Mobility   min A transfers and gait with RW  min A overall  family ed, d/c planning, balance, gait   Communication             Safety/Cognition/ Behavioral Observations  Supervision-Min A  Supervision  Complex problem solving, recall, attention to Left    Pain   Patient had no complaints of pain  pain of </= 2/10  assess pain q shift and PRN   Skin   Patient has no skin issues  Remain free of infection/breakdown  assess skin q shift & PRN    Rehab Goals Patient on target to meet rehab goals: Yes Rehab Goals Revised: NA *See Care Plan and progress notes for long and short-term goals.     Barriers to Discharge  Current Status/Progress Possible  Resolutions Date Resolved   Physician    Medical stability        diabetes ed, adjust medication regimens      Nursing                  PT                    OT                  SLP                SW                Discharge Planning/Teaching Needs:  Pt plans to go home with his dtr who will provide 24/7 assistance, as well as other family members to assist.  Will plan to arrange fam ed for end of this week.   Team Discussion:  Doing well medically and no significant nursing concerns.  Working [employer]  and problem solving is improving.  Min assist amb goals overall.  Will be ready for family ed soon.  Revisions to Treatment Plan:  None    Continued Need for Acute Rehabilitation Level of Care: The patient requires daily medical management by a physician with specialized training in physical medicine and rehabilitation for the following conditions: Daily direction of a multidisciplinary physical rehabilitation program to ensure safe treatment while eliciting the highest outcome that is of practical value to the patient.: Yes Daily medical management of patient stability for increased activity during participation in an intensive rehabilitation regime.: Yes Daily analysis of laboratory values and/or radiology reports with any subsequent need for medication adjustment of medical intervention for : Neurological problems;Diabetes problems;Blood pressure problems  [name] 07/03/2018, 4:24 PM

## 2018-07-03 NOTE — Progress Notes (Signed)
Physical Therapy Session Note  Patient Details  Name: Karl [name] MRN: [id number] Date of Birth: [birth year]  Today's Date: 07/03/2018 PT Individual Time: 0915-1000 PT Individual Time Calculation (min): 45 min   Short Term Goals: Week 3:  PT Short Term Goal 1 (Week 3): = LTG  Skilled Therapeutic Interventions/Progress Updates: Pt received seated in w/c, denies pain and agreeable to treatment. Skilled co-treat with recreational therapist. Dons socks, shoes with S and increased time. Transported totalA to gym in w/c for energy conservation. Gait x50' with RW and min guard; tactile cues for L glute activation, verbal cues for upright posture and increased gait speed. Standing balance with R lateral weight shifting to retrieve parts for pipe tree, then built pipe tree puzzle with no UE support. Static standing with narrow BOS x1 min with strong L lateral lean, improved with verbal cues for shifting weight towards recreational therapist standing on R side. Staggered stance RUE lead with R lateral reaching when breaking down pipe tree for simulation of anterolateral weight shift with gait. Stand pivot transfer to return to w/c with RW and close S. Remained seated in w/c at end of session, lap belt alarm intact and all needs in reach.      Therapy Documentation Precautions:  Precautions Precautions: Fall Precaution Comments: poor proprioceptive awareness of Lt  Restrictions Weight Bearing Restrictions: No  See Function Navigator for Current Functional Status.   Therapy/Group: Individual Therapy  [name] 07/03/2018, 10:26 AM

## 2018-07-03 NOTE — Progress Notes (Signed)
ANTICOAGULATION CONSULT NOTE - Follow Up Consult  Pharmacy Consult for Coumadin Indication: stroke  Allergies  Allergen Reactions  . Lipitor [Atorvastatin] Nausea And Vomiting    Patient Measurements: Height: 6\' 2"  (188 cm) Weight: 199 lb 11.8 oz (90.6 kg) IBW/kg (Calculated) : 82.2  Vital Signs: Temp: 98 F (36.7 C) (07/31 0525) Temp Source: Oral (07/31 0525) BP: 132/80 (07/31 0525) Pulse Rate: 78 (07/31 0525)  Labs: Recent Labs    07/01/18 0552 07/02/18 0526 07/03/18 0533  HGB  --  12.0*  --   HCT  --  38.9*  --   PLT  --  269  --   LABPROT 21.3* 25.0* 29.4*  INR 1.86 2.28 2.81  CREATININE  --  0.97  --     Estimated Creatinine Clearance: 90.6 mL/min (by C-G formula based on SCr of 0.97 mg/dL).  Assessment: 62 YO M on warfarin PTA for hx CVA- held on admission for a scheduled procedure, per report patient restarted post-procedure, but INR was subtherapeutic on admission. 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 Pharmacy consulted for warfarin dosing.   INR is therapeutic, however large increase from yesterday at (2.28>2.81).  CBC stable, no signs of bleeding noted.   Home warfarin regimen:  10 mg daily  Goal of Therapy:  INR 2-3 Monitor platelets by anticoagulation protocol: Yes   Plan:  Will decrease to warfarin 5 mg po x1 today considering large jump in INR today.  Monitor daily INR, CBC, clinical course, s/sx of bleed  Thank you for allowing [place] to participate in this patients care.  Gwynneth Albright, Ilda Basset D PGY1 Pharmacy Resident  Phone [phone] 07/03/2018   8:24 AM

## 2018-07-03 NOTE — Progress Notes (Signed)
Occupational Therapy Session Note  Patient Details  Name: IVAR DOMANGUE MRN: [id number] Date of Birth: [birth year]  Today's Date: 07/03/2018 OT Individual Time: 1430-1500 OT Individual Time Calculation (min): 30 min    Short Term Goals: Week 1:  OT Short Term Goal 1 (Week 1): Pt will recall hemi dressing techniques with min questioning cues OT Short Term Goal 1 - Progress (Week 1): Met OT Short Term Goal 2 (Week 1): Pt will complete sit<>stand with no more than min A in preparation for BADL tasks OT Short Term Goal 2 - Progress (Week 1): Met OT Short Term Goal 3 (Week 1): Pt will complete toilet transfer with min A OT Short Term Goal 3 - Progress (Week 1): Met  Skilled Therapeutic Interventions/Progress Updates:    1:1 Received pt in w/c.  Pt requested to go to bathroom. Pt ambulated in and out of bathroom with min A with RW with extra time and cues for RW safety.  Pt makes very intentional movements with left LE. Pt able to perform 2/3 toileting steps (pt required A to pull back up pants due to balance and needing UE support. Pt then ambulated to RN station ~116 feet with RW at a very slow pace.  Focus on weight shifts and maintaining upright posture. After seated rest break pt able to ambulate another 220 feet with one seated rest break inbetween again with min to mod A; demonstrating increased balance reactions and activity tolerance. Pt left in w/c with chair alarm in place.   Therapy Documentation Precautions:  Precautions Precautions: Fall Precaution Comments: poor proprioceptive awareness of Lt  Restrictions Weight Bearing Restrictions: No  Pain:   no c/o pain  ADL: ADL ADL Comments: Please see functional navigator  See Function Navigator for Current Functional Status.   Therapy/Group: Individual Therapy  Willeen Cass [facility] 07/03/2018, 3:47 PM

## 2018-07-03 NOTE — Progress Notes (Signed)
Occupational Therapy Session Note  Patient Details  Name: Karl [name] MRN: [id number] Date of Birth: [birth year]  Today's Date: 07/03/2018 OT Individual Time: 1100-1200 OT Individual Time Calculation (min): 60 min   Short Term Goals: Week 3:  OT Short Term Goal 1 (Week 3): STG=LTG 2/2 ELOS  Skilled Therapeutic Interventions/Progress Updates:    Pt greeted seated in wc and agreeable to OT treatment session. Pt requests to shower today. Pt completed sit<>stand from wc with RW, 2 trials, and min A. Had pt practice functional ambulation in room to get to the bathroom. Pt needed overall min A to ambulate with RW to bathroom. Verbal and tactile cues for RW management when turning to sit onto bench. Forced use of L UE within bathing tasks with pt able to maintain grasp on wash cloth 75% of the time. Stand-pivot out of shower to wc with close supervision. LB dressing wc at the sink with supervision to thread B LEs into pants, close supervision sit<>stand. Verbal cues to alternate UE when pulling up pants.  L fine motor coordination with card dealing activity. Pt returned to room at end of session and left with safety belt on and lunch set-up.  Therapy Documentation Precautions:  Precautions Precautions: Fall Precaution Comments: poor proprioceptive awareness of Lt  Restrictions Weight Bearing Restrictions: No Pain: Pain Assessment Pain Scale: 0-10 Pain Score: 0-No pain ADL: ADL ADL Comments: Please see functional navigator   See Function Navigator for Current Functional Status.  Therapy/Group: Individual Therapy  Mal Amabile 07/03/2018, 11:23 AM

## 2018-07-04 ENCOUNTER — Inpatient Hospital Stay (HOSPITAL_COMMUNITY): Payer: Managed Care, Other (non HMO) | Admitting: Speech Pathology

## 2018-07-04 ENCOUNTER — Inpatient Hospital Stay (HOSPITAL_COMMUNITY): Payer: Managed Care, Other (non HMO) | Admitting: Physical Therapy

## 2018-07-04 ENCOUNTER — Encounter (HOSPITAL_COMMUNITY): Payer: Managed Care, Other (non HMO) | Admitting: *Deleted

## 2018-07-04 ENCOUNTER — Inpatient Hospital Stay (HOSPITAL_COMMUNITY): Payer: Managed Care, Other (non HMO) | Admitting: Occupational Therapy

## 2018-07-04 LAB — GLUCOSE, CAPILLARY
GLUCOSE-CAPILLARY: 126 mg/dL — AB (ref 70–99)
Glucose-Capillary: 214 mg/dL — ABNORMAL HIGH (ref 70–99)
Glucose-Capillary: 133 mg/dL — ABNORMAL HIGH (ref 70–99)
Glucose-Capillary: 114 mg/dL — ABNORMAL HIGH (ref 70–99)

## 2018-07-04 LAB — PROTIME-INR
INR: 3.01
Prothrombin Time: 30.9 seconds — ABNORMAL HIGH (ref 11.4–15.2)

## 2018-07-04 NOTE — Progress Notes (Signed)
Physical Therapy Session Note  Patient Details  Name: Karl [name] MRN: [id number] Date of Birth: 11-20-1956  Today's Date: 07/04/2018 PT Individual Time: 0830-0930 PT Individual Time Calculation (min): 60 min   Short Term Goals: Week 3:  PT Short Term Goal 1 (Week 3): = LTG  Skilled Therapeutic Interventions/Progress Updates:    Pt received semi-reclined in bed, agreeable to PT. No complaints of pain. Semi-reclined to sitting EOB with Supervision. Sit to stand with min A to RW, min A to change pants from pajama pants to sweatpants with v/c for safety. SPT bed to w/c with min A. Manual w/c propulsion x 150 ft with B UE/LE and min A for steering and to attend to L side. Sit to stand 2 x 5 reps from elevated mat table to RW, focus on decreased UE support, min A, v/c for forward weight shift. Ambulation 2 x 75 ft with RW and min A, slightly ataxic, decreased gait speed, focus on upright posture. Standing balance with min A while urinating in toilet. Pt left seated in w/c in room with needs in reach and quick-release belt in place.  Therapy Documentation Precautions:  Precautions Precautions: Fall Precaution Comments: poor proprioceptive awareness of Lt  Restrictions Weight Bearing Restrictions: No   See Function Navigator for Current Functional Status.   Therapy/Group: Individual Therapy  Peter Congo, PT, DPT  07/04/2018, 10:12 AM

## 2018-07-04 NOTE — Progress Notes (Signed)
Social Work Patient ID: Karl [name], male   DOB: 1956-03-22, 62 y.o.   MRN: [id number]   Have reviewed team conference with pt and daughter, [name].  Both aware that we continue to plan toward a d/c date of 8/6 and both pleased with progress.  Have planned for daughter to be here on Monday 10-12 for family education and I will follow up with her again on that day.  Reviewed HH and DME arrangements/ referrals that I will take care of.  No further questions at this time.  Jahnia Hewes, LCSW

## 2018-07-04 NOTE — Progress Notes (Signed)
[place] PHYSICAL MEDICINE & REHABILITATION     PROGRESS NOTE    Subjective/Complaints: Had some swelling on his right foot.  ROS: Patient denies fever, rash, sore throat, blurred vision, nausea, vomiting, diarrhea, cough, shortness of breath or chest pain, joint or back pain, headache, or mood change.    Objective:  No results found. Recent Labs    07/02/18 0526  WBC 5.3  HGB 12.0*  HCT 38.9*  PLT 269   Recent Labs    07/02/18 0526  NA 137  K 3.8  CL 105  GLUCOSE 133*  BUN 7*  CREATININE 0.97  CALCIUM 9.1   CBG (last 3)  Recent Labs    07/03/18 1629 07/03/18 2127 07/04/18 0636  GLUCAP 125* 126* 126*    Wt Readings from Last 3 Encounters:  07/03/18 90.6 kg (199 lb 11.8 oz)  06/14/18 105.2 kg (232 lb)  06/11/18 105.2 kg (232 lb)     Intake/Output Summary (Last 24 hours) at 07/04/2018 0923 Last data filed at 07/03/2018 2244 Gross per 24 hour  Intake 460 ml  Output -  Net 460 ml    Vital Signs: Blood pressure 128/81, pulse 73, temperature 98.3 F (36.8 C), temperature source Oral, resp. rate 16, height 6\' 2"  (1.88 m), weight 90.6 kg (199 lb 11.8 oz), SpO2 93 %. Physical Exam:  General: No acute distress HEENT: EOMI, oral membranes moist Cards: reg rate  Chest: normal effort Abdomen: Soft, NT, ND Skin: dry, intact Extremities: no edema Neurological:  Left central 7.  Continued decreased coordination in the left arm and leg. Motor:  5/5 RUE/RLE.  LUE 4-4+/5 prox to distal (stable).  LLE 4+/5 (stable). Psych: normal mood. Normal affect.  Assessment/Plan: 1. Functional and mobility deficits secondary to bilateral posterior circulation infarcts which require 3+ hours per day of interdisciplinary therapy in a comprehensive inpatient rehab setting. Physiatrist is providing close team supervision and 24 hour management of active medical problems listed below. Physiatrist and rehab team continue to assess barriers to discharge/monitor patient  progress toward functional and medical goals.  Function:  Bathing Bathing position Bathing activity did not occur: N/A Position: Shower  Bathing parts Body parts bathed by patient: Right arm, Left arm, Chest, Abdomen, Front perineal area, Right upper leg, Left upper leg, Buttocks, Left lower leg, Right lower leg Body parts bathed by helper: Right lower leg, Left lower leg, Buttocks  Bathing assist Assist Level: Supervision or verbal cues      Upper Body Dressing/Undressing Upper body dressing   What is the patient wearing?: Pull over shirt/dress     Pull over shirt/dress - Perfomed by patient: Thread/unthread left sleeve, Put head through opening, Pull shirt over trunk, Thread/unthread right sleeve          Upper body assist Assist Level: Supervision or verbal cues      Lower Body Dressing/Undressing Lower body dressing Lower body dressing/undressing activity did not occur: N/A What is the patient wearing?: Pants, Underwear, Socks, Shoes Underwear - Performed by patient: Thread/unthread left underwear leg, Pull underwear up/down, Thread/unthread right underwear leg   Pants- Performed by patient: Pull pants up/down, Thread/unthread left pants leg, Thread/unthread right pants leg Pants- Performed by helper: Fasten/unfasten pants Non-skid slipper socks- Performed by patient: Don/doff right sock, Don/doff left sock Non-skid slipper socks- Performed by helper: Don/doff left sock, Don/doff right sock Socks - Performed by patient: Don/doff right sock, Don/doff left sock   Shoes - Performed by patient: Don/doff right shoe, Don/doff left shoe, Fasten right,  Fasten left            Lower body assist Assist for lower body dressing: Touching or steadying assistance (Pt > 75%)      Toileting Toileting   Toileting steps completed by patient: Adjust clothing prior to toileting, Performs perineal hygiene, Adjust clothing after toileting Toileting steps completed by helper: Adjust  clothing after toileting Toileting Assistive Devices: Grab bar or rail  Toileting assist Assist level: Touching or steadying assistance (Pt.75%)   Transfers Chair/bed transfer   Chair/bed transfer method: Stand pivot Chair/bed transfer assist level: Touching or steadying assistance (Pt > 75%) Chair/bed transfer assistive device: Walker, Designer, fashion/clothing     Max distance: 50 Assist level: Touching or steadying assistance (Pt > 75%)   Wheelchair   Type: Manual Max wheelchair distance: 75 Assist Level: Supervision or verbal cues  Cognition Comprehension Comprehension assist level: Understands basic 90% of the time/cues < 10% of the time  Expression Expression assist level: Expresses basic 90% of the time/requires cueing < 10% of the time.  Social Interaction Social Interaction assist level: Interacts appropriately 90% of the time - Needs monitoring or encouragement for participation or interaction.  Problem Solving Problem solving assist level: Solves basic 90% of the time/requires cueing < 10% of the time  Memory Memory assist level: Recognizes or recalls 90% of the time/requires cueing < 10% of the time   Medical Problem List and Plan:  1. 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 DVT Prophylaxis/Anticoagulation: Pharmaceutical: Coumadin INR 3 today     3. Pain Management: tylenol prn  4. Mood: LCSW to follow for evaluation and support.  5. Neuropsych: This patient is capable of making decisions on his own behalf.  6. Skin/Wound Care: routine pressure relief measures.  7. Fluids/Electrolytes/Nutrition: Monitor I/O.      8. CAD with ICM: Coumadin with INR goal 2-3. Continue coreg and Crestor.  9.T2DM: Metformin increased to 850mg  bid (PTA 1000 twice a day) CBG (last 3)  Recent Labs    07/03/18 1629 07/03/18 2127 07/04/18 0636   GLUCAP 125* 126* 126*   Increased metformin to home dose of 1000mg  bid    10. Hypokalemia:  resolved 11. HTN: Monitor BP bid--on coreg bid. Off Vasotec at this time    Vitals:   07/03/18 1944 07/04/18 0509  BP: 123/71 128/81  Pulse: 79 73  Resp: 16 16  Temp: 98.3 F (36.8 C) 98.3 F (36.8 C)  SpO2: 100% 93%   Improved control 12. TURP/BPH: On Proscar and Flomax.   Controlled 13. Hyponatremia  Sodium 137 on 7/29  Continue to monitor 14. Acute lower UTI  UA +, urine culture with multiple species  -Macrobid completed  LOS (Days) 16 A FACE TO FACE EVALUATION WAS PERFORMED  Ranelle Oyster, MD 07/04/2018 9:23 AM

## 2018-07-04 NOTE — Progress Notes (Signed)
Occupational Therapy Session Note  Patient Details  Name: Karl [name] MRN: [id number] Date of Birth: [birth year]  Today's Date: 07/04/2018 OT Individual Time: 1530-1600 OT Individual Time Calculation (min): 30 min    Short Term Goals: Week 2:  OT Short Term Goal 1 (Week 2): Pt will maintain standing balance at the sink with no more than CGA while performing grooming task. OT Short Term Goal 1 - Progress (Week 2): Met OT Short Term Goal 2 (Week 2): Pt will complete shower transfer onto bench with no more than CGA  OT Short Term Goal 2 - Progress (Week 2): Met OT Short Term Goal 3 (Week 2): Pt will utilize L UE during bathing tasks with no more than 3 verbal cues OT Short Term Goal 3 - Progress (Week 2): Met OT Short Term Goal 4 (Week 2): Pt will be given home fine motor program OT Short Term Goal 4 - Progress (Week 2): Met  Skilled Therapeutic Interventions/Progress Updates:    Treatment session with focus on stand pivot transfers, Lt attention, and LUE motor control.  Pt received seated upright in w/c reporting need to toilet.  Completed stand pivot transfer to toilet with contact guard.  Completed toileting in sitting, able to complete clothing management in standing with contact guard.  Engaged in Connect 4 activity in standing with focus on visual attention to Lt as well as reaching outside BOS and across midline to obtain checker pieces on Rt and Lt of table.  Pt requiring increased time with gathering checkers on Lt side of body.  Min guard during standing.  Pt returned to bed squat pivot with min assist due to decreased weight shift to Lt.  Left supine in bed with all needs in reach.  Therapy Documentation Precautions:  Precautions Precautions: Fall Precaution Comments: poor proprioceptive awareness of Lt  Restrictions Weight Bearing Restrictions: No General:   Vital Signs: Therapy Vitals Temp: 98.2 F (36.8 C) Temp Source: Oral Pulse Rate: 87 Resp: 18 BP: 122/75 Patient  Position (if appropriate): Sitting Oxygen Therapy SpO2: 100 % O2 Device: Room Air Pain:  Pt with no c/o pain  See Function Navigator for Current Functional Status.   Therapy/Group: Individual Therapy  Simonne Come 07/04/2018, 4:07 PM

## 2018-07-04 NOTE — Progress Notes (Signed)
Occupational Therapy Session Note  Patient Details  Name: Karl [name] MRN: [id number] Date of Birth: 11/25/1956  Today's Date: 07/04/2018 OT Individual Time: 1000-1130 OT Individual Time Calculation (min): 90 min   Short Term Goals: Week 3:  OT Short Term Goal 1 (Week 3): STG=LTG 2/2 ELOS  Skilled Therapeutic Interventions/Progress Updates:    Pt greeted seated in wc and agreeable to OT. Standing weight taken for nursing records with CGA to stand using bars on standing scale. Pt reported need for bathroom so stand-pivot completed to face toilet to urinate. Pt able to remove unilateral UE from grab bar, but missed the toilet bowl 2/2 balance and coordination deficits. Discussed with pt trying to sit even to urinate for now. Pt brought to therapy gym and ambulated to therapy mat with CGA overall with RW. Worked on standing balance and trunk shortening/elongation using NDT techniques in standing. Utilized raised basketball goal and horse shoes to facilitate trunk elongation/shortening.Overall CGA for balance when reaching outside base of support. Facilitated normal movement patterns when reaching with L UE to place horse shoes. Incorporated trunk rotation for added balance challenge when removing horse shoes from goal. Pt returned to room and requested to wash up at the sink. Facilitated forced use of L UE throughout bathing tasks with improved grip on wash cloth and clothing. Pt left seated in wc at end of session with alarm belt on and needs met.   Therapy Documentation Precautions:  Precautions Precautions: Fall Precaution Comments: poor proprioceptive awareness of Lt  Restrictions Weight Bearing Restrictions: No Pain:  None/denies pain ADL: ADL ADL Comments: Please see functional navigator  See Function Navigator for Current Functional Status.   Therapy/Group: Individual Therapy   S  07/04/2018, 11:35 AM  

## 2018-07-04 NOTE — Progress Notes (Signed)
ANTICOAGULATION CONSULT NOTE - Follow Up Consult  Pharmacy Consult for Coumadin Indication: stroke  Allergies  Allergen Reactions  . Lipitor [Atorvastatin] Nausea And Vomiting    Patient Measurements: Height: 6\' 2"  (188 cm) Weight: 199 lb 11.8 oz (90.6 kg) IBW/kg (Calculated) : 82.2  Vital Signs: Temp: 98.3 F (36.8 C) (08/01 0509) Temp Source: Oral (08/01 0509) BP: 128/81 (08/01 0509) Pulse Rate: 73 (08/01 0509)  Labs: Recent Labs    07/02/18 0526 07/03/18 0533 07/04/18 0307  HGB 12.0*  --   --   HCT 38.9*  --   --   PLT 269  --   --   LABPROT 25.0* 29.4* 30.9*  INR 2.28 2.81 3.01  CREATININE 0.97  --   --     Estimated Creatinine Clearance: 90.6 mL/min (by C-G formula based on SCr of 0.97 mg/dL).  Assessment: 62 YO M on warfarin PTA for hx CVA-  INR slightly above goal after decreased dose yesterday  Home warfarin regimen:  10 mg daily  Goal of Therapy:  INR 2-3 Monitor platelets by anticoagulation protocol: Yes   Plan:  Warfarin 4 mg po x 1 today Monitor daily INR, CBC, clinical course, s/sx of bleed, PO intake, DDI  Thank you Okey Regal, PharmD [phone] Please check AMION for all [facility] Pharmacy numbers 07/04/2018, 10:05 AM

## 2018-07-04 NOTE — Progress Notes (Signed)
Speech Language Pathology Daily Session Notes  Patient Details  Name: Karl [name] MRN: [id number] Date of Birth: 1956-10-05  Today's Date: 07/04/2018  Session 1: SLP Individual Time: 0700-0730 SLP Individual Time Calculation (min): 30 min   Session 1: SLP Individual Time: 1130-1200 SLP Individual Time Calculation (min): 30 min  Short Term Goals: Week 3: SLP Short Term Goal 1 (Week 3): STGs=LTGs  Skilled Therapeutic Interventions:  Session 1: Skilled treatment session focused on dysphagia and cognitive goals. SLP facilitated session by providing skilled observation with breakfast meal of Dys. 3 textures with thin liquids. Patient consumed meal without overt s/s of aspiration with Mod I, recommend patient continue current and initiate trials of regular textures. Patient was also Mod I for tray set-up and utilization of LUE throughout tasks. Patient left upright in bed with alarm on and all needs within reach. Continue with current plan of care.    Session 2: Skilled treatment session focused on cognitive goals. SLP re-administered the MoCA (version 7.3). Patient scored 25/30 points with a score of 26 or above considered normal. Patient's score improved 5 points since evaluation but patient continues to demonstrate mild impairments in recall. Patient educated on results and verbalized understanding.  Patient left upright in wheelchair with all needs within reach. Continue with current plan of care.   Function:  Eating Eating   Modified Consistency Diet: Yes Eating Assist Level: Swallowing techniques: self managed           Cognition Comprehension Comprehension assist level: Understands basic 90% of the time/cues < 10% of the time  Expression   Expression assist level: Expresses basic 90% of the time/requires cueing < 10% of the time.  Social Interaction Social Interaction assist level: Interacts appropriately 90% of the time - Needs monitoring or encouragement for participation or  interaction.  Problem Solving Problem solving assist level: Solves basic 90% of the time/requires cueing < 10% of the time  Memory Memory assist level: Recognizes or recalls 90% of the time/requires cueing < 10% of the time    Pain No/Denies Pain   Therapy/Group: Individual Therapy  Javonna Balli 07/04/2018, 8:37 AM

## 2018-07-05 ENCOUNTER — Inpatient Hospital Stay (HOSPITAL_COMMUNITY): Payer: Managed Care, Other (non HMO) | Admitting: Speech Pathology

## 2018-07-05 ENCOUNTER — Inpatient Hospital Stay (HOSPITAL_COMMUNITY): Payer: Managed Care, Other (non HMO) | Admitting: Physical Therapy

## 2018-07-05 ENCOUNTER — Inpatient Hospital Stay (HOSPITAL_COMMUNITY): Payer: Managed Care, Other (non HMO)

## 2018-07-05 LAB — GLUCOSE, CAPILLARY
Glucose-Capillary: 130 mg/dL — ABNORMAL HIGH (ref 70–99)
Glucose-Capillary: 99 mg/dL (ref 70–99)
Glucose-Capillary: 218 mg/dL — ABNORMAL HIGH (ref 70–99)
Glucose-Capillary: 82 mg/dL (ref 70–99)

## 2018-07-05 LAB — PROTIME-INR
INR: 2.32
PROTHROMBIN TIME: 25.2 s — AB (ref 11.4–15.2)

## 2018-07-05 MED ORDER — WARFARIN SODIUM 7.5 MG PO TABS
7.5000 mg | ORAL_TABLET | Freq: Once | ORAL | Status: AC
  Administered 2018-07-05: 7.5 mg via ORAL
  Filled 2018-07-05: qty 1

## 2018-07-05 NOTE — Progress Notes (Signed)
[facility] PHYSICAL MEDICINE & REHABILITATION     PROGRESS NOTE    Subjective/Complaints: No new complaints. In good spirits. Denies pain  ROS: Patient denies fever, rash, sore throat, blurred vision, nausea, vomiting, diarrhea, cough, shortness of breath or chest pain, joint or back pain, headache, or mood change.    Objective:  No results found. No results for input(s): WBC, HGB, HCT, PLT in the last 72 hours. No results for input(s): NA, K, CL, GLUCOSE, BUN, CREATININE, CALCIUM in the last 72 hours.  Invalid input(s): CO CBG (last 3)  Recent Labs    07/04/18 1649 07/04/18 2136 07/05/18 0631  GLUCAP 133* 114* 130*    Wt Readings from Last 3 Encounters:  07/05/18 89.9 kg (198 lb 3.1 oz)  06/14/18 105.2 kg (232 lb)  06/11/18 105.2 kg (232 lb)     Intake/Output Summary (Last 24 hours) at 07/05/2018 1018 Last data filed at 07/05/2018 0900 Gross per 24 hour  Intake 700 ml  Output -  Net 700 ml    Vital Signs: Blood pressure (!) 145/86, pulse 76, temperature 98.5 F (36.9 C), temperature source Oral, resp. rate 18, height 6\' 2"  (1.88 m), weight 89.9 kg (198 lb 3.1 oz), SpO2 100 %. Physical Exam:  Constitutional: No distress . Vital signs reviewed. HEENT: EOMI, oral membranes moist Neck: supple Cardiovascular: RRR without murmur. No JVD    Respiratory: CTA Bilaterally without wheezes or rales. Normal effort    GI: BS +, non-tender, non-distended  Extremities: no edema Neurological:  Left central 7.  Continued decreased coordination in the left arm and leg. Motor:  5/5 RUE/RLE.  LUE 4-4+/5 prox to distal (stable).  LLE 4+/5 (stable). Psych: normal mood. Normal affect.  Assessment/Plan: 1. Functional and mobility deficits secondary to bilateral posterior circulation infarcts which require 3+ hours per day of interdisciplinary therapy in a comprehensive inpatient rehab setting. Physiatrist is providing close team supervision and 24 hour management of active  medical problems listed below. Physiatrist and rehab team continue to assess barriers to discharge/monitor patient progress toward functional and medical goals.  Function:  Bathing Bathing position Bathing activity did not occur: N/A Position: Shower  Bathing parts Body parts bathed by patient: Right arm, Left arm, Chest, Abdomen, Front perineal area, Right upper leg, Left upper leg, Buttocks, Left lower leg, Right lower leg Body parts bathed by helper: Right lower leg, Left lower leg, Buttocks  Bathing assist Assist Level: Supervision or verbal cues      Upper Body Dressing/Undressing Upper body dressing   What is the patient wearing?: Pull over shirt/dress     Pull over shirt/dress - Perfomed by patient: Thread/unthread left sleeve, Put head through opening, Pull shirt over trunk, Thread/unthread right sleeve          Upper body assist Assist Level: Supervision or verbal cues      Lower Body Dressing/Undressing Lower body dressing Lower body dressing/undressing activity did not occur: N/A What is the patient wearing?: Pants, Underwear, Socks, Shoes Underwear - Performed by patient: Thread/unthread left underwear leg, Pull underwear up/down, Thread/unthread right underwear leg   Pants- Performed by patient: Pull pants up/down, Thread/unthread left pants leg, Thread/unthread right pants leg Pants- Performed by helper: Fasten/unfasten pants Non-skid slipper socks- Performed by patient: Don/doff right sock, Don/doff left sock Non-skid slipper socks- Performed by helper: Don/doff left sock, Don/doff right sock Socks - Performed by patient: Don/doff right sock, Don/doff left sock   Shoes - Performed by patient: Don/doff right shoe, Don/doff left shoe,  Fasten right, Fasten left            Lower body assist Assist for lower body dressing: Touching or steadying assistance (Pt > 75%)      Toileting Toileting   Toileting steps completed by patient: Adjust clothing prior to  toileting, Performs perineal hygiene, Adjust clothing after toileting Toileting steps completed by helper: Adjust clothing after toileting Toileting Assistive Devices: Grab bar or rail  Toileting assist Assist level: Touching or steadying assistance (Pt.75%)   Transfers Chair/bed transfer   Chair/bed transfer method: Stand pivot Chair/bed transfer assist level: Touching or steadying assistance (Pt > 75%) Chair/bed transfer assistive device: Walker, Designer, fashion/clothing     Max distance: 76' Assist level: Touching or steadying assistance (Pt > 75%)   Wheelchair   Type: Manual Max wheelchair distance: 150 Assist Level: Touching or steadying assistance (Pt > 75%)  Cognition Comprehension Comprehension assist level: Understands basic 90% of the time/cues < 10% of the time  Expression Expression assist level: Expresses basic 90% of the time/requires cueing < 10% of the time.  Social Interaction Social Interaction assist level: Interacts appropriately 90% of the time - Needs monitoring or encouragement for participation or interaction.  Problem Solving Problem solving assist level: Solves basic 90% of the time/requires cueing < 10% of the time  Memory Memory assist level: Recognizes or recalls 90% of the time/requires cueing < 10% of the time   Medical Problem List and Plan:  1. 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 DVT Prophylaxis/Anticoagulation: Pharmaceutical: Coumadin INR 3 today     3. Pain Management: tylenol prn  4. Mood: LCSW to follow for evaluation and support.  5. Neuropsych: This patient is capable of making decisions on his own behalf.  6. Skin/Wound Care: routine pressure relief measures.  7. Fluids/Electrolytes/Nutrition: Monitor I/O.      8. CAD with ICM: Coumadin with INR goal 2-3. Continue coreg and Crestor.  9.T2DM:  Metformin increased to 850mg  bid (PTA 1000 twice a day) CBG (last 3)  Recent Labs    07/04/18 1649 07/04/18 2136 07/05/18 0631  GLUCAP 133* 114* 130*   Increased metformin to home dose of 1000mg  bid   -improved control 10. Hypokalemia:  resolved 11. HTN: Monitor BP bid--on coreg bid. Off Vasotec at this time    Vitals:   07/04/18 1438 07/04/18 2045  BP: 122/75 (!) 145/86  Pulse: 87 76  Resp: 18 18  Temp: 98.2 F (36.8 C) 98.5 F (36.9 C)  SpO2: 100% 100%   Improved control 12. TURP/BPH: On Proscar and Flomax.   Controlled 13. Hyponatremia  Sodium 137 on 7/29  Continue to monitor 14. Acute lower UTI  UA +, urine culture with multiple species  -Macrobid completed  LOS (Days) 17 A FACE TO FACE EVALUATION WAS PERFORMED  Ranelle Oyster, MD 07/05/2018 10:18 AM

## 2018-07-05 NOTE — Progress Notes (Signed)
Speech Language Pathology Daily Session Note  Patient Details  Name: Karl [name] MRN: [id number] Date of Birth: Aug 24, 1956  Today's Date: 07/05/2018 SLP Individual Time: 0830-0930 SLP Individual Time Calculation (min): 60 min  Short Term Goals: Week 3: SLP Short Term Goal 1 (Week 3): STGs=LTGs  Skilled Therapeutic Interventions: Skilled treatment session focused on dysphagia and cognitive goals. SLP facilitated session by providing trials of regular textures. Patient demonstrated efficient but mildly prolonged mastication due to missing dentition and complete oral clearance without overt s/s of aspiration. Therefore, recommend trial tray prior to upgrade. SLP also facilitated session with a discussion in regards to d/c planning and how to implement memory compensatory strategies at home.  Patient requested to use the bathroom and required supervision verbal cues for safety throughout task. Patient left upright in wheelchair with alarm on and all needs within reach. Continue with current plan of care.      Function:  Eating Eating   Modified Consistency Diet: No(with trials from SLP ) Eating Assist Level: Swallowing techniques: self managed           Cognition Comprehension Comprehension assist level: Understands basic 90% of the time/cues < 10% of the time  Expression   Expression assist level: Expresses basic 90% of the time/requires cueing < 10% of the time.  Social Interaction Social Interaction assist level: Interacts appropriately 90% of the time - Needs monitoring or encouragement for participation or interaction.  Problem Solving Problem solving assist level: Solves basic 90% of the time/requires cueing < 10% of the time  Memory Memory assist level: Recognizes or recalls 90% of the time/requires cueing < 10% of the time    Pain No/Denies Pain   Therapy/Group: Individual Therapy  Alaine Loughney 07/05/2018, 12:33 PM

## 2018-07-05 NOTE — Progress Notes (Signed)
Physical Therapy Session Note  Patient Details  Name: Karl [name] MRN: [id number] Date of Birth: 09-05-1956  Today's Date: 07/05/2018 PT Individual Time: 8588-5027   74 min   Short Term Goals: Week 1:  PT Short Term Goal 1 (Week 1): pt will perform bed mobility with supervision assist  PT Short Term Goal 1 - Progress (Week 1): Progressing toward goal PT Short Term Goal 2 (Week 1): Pt will perform bed<>WC transfers with min assist  PT Short Term Goal 2 - Progress (Week 1): Progressing toward goal PT Short Term Goal 3 (Week 1): Pt will maintain standing balance with min assist x 2 min   PT Short Term Goal 3 - Progress (Week 1): Progressing toward goal PT Short Term Goal 4 (Week 1): Pt will ambulate 72f with min assist and LRAD  PT Short Term Goal 4 - Progress (Week 1): Progressing toward goal PT Short Term Goal 5 (Week 1): Pt will attend to L side 569fof the time with min cues durring functional tasks.  PT Short Term Goal 5 - Progress (Week 1): Progressing toward goal Week 2:  PT Short Term Goal 1 (Week 2): pt will perform functional transfers with min A PT Short Term Goal 1 - Progress (Week 2): Met PT Short Term Goal 2 (Week 2): pt will perform gait with mod A x 50' in controlled environment PT Short Term Goal 2 - Progress (Week 2): Met Week 3:  PT Short Term Goal 1 (Week 3): = LTG Week 4:     Skilled Therapeutic Interventions/Progress Updates:   Pt received supine in bed and agreeable to PT. Supine>sit transfer with supervision assist from PT for safety.    Lower body dressing with supervision assist to doff grip socks and donn regular sacks and shoes.  Toileting with CGA for safety for stand pivot transfer to toilet. Pt able to perform all clothing management with UE support on rail in bathroom. Pt performed hand hygiene following urination with supervision assist standing at sink.   Gait training over level surface in rehab gym x [id number] supervision assist fading to min  assist for last [id number] to excessive weight shift to the L and unlevel surface of cement sidewalk 2 x [id number] min assist from PT for safety. Prolonged rest break following each bout of gait training over sidewalk.    Dynamic standing balance training foot taps on 6 in step x 10 BLE with UE support on rails. Foot taps to target on 6 inch step with UE support on rails x 10 BLE. Foot tap to target on 4 inch step with UE support on RW. Min assist with use of rails and mod assist from PT with RW. Pt noted to have poor corrective responses with LOB to the L with use of RW.   Patient returned to room and [facility] and all needs met.          Therapy Documentation Precautions:  Precautions Precautions: Fall Precaution Comments: poor proprioceptive awareness of Lt  Restrictions Weight Bearing Restrictions: No    Vital Signs: Therapy Vitals Temp: 98.2 F (36.8 C) Temp Source: Oral Pulse Rate: 73 Resp: 18 BP: 122/78 Patient Position (if appropriate): Sitting Oxygen Therapy SpO2: 100 % O2 Device: Room Air Pain: Denies   See Function Navigator for Current Functional Status.   Therapy/Group: Individual Therapy  AustiLorie Phenix2019, 3:30 PM

## 2018-07-05 NOTE — Plan of Care (Signed)
  Problem: Consults Goal: RH STROKE PATIENT EDUCATION Description See Patient Education module for education specifics  Outcome: Progressing   Problem: RH BOWEL ELIMINATION Goal: RH STG MANAGE BOWEL WITH ASSISTANCE Description STG Manage Bowel with  Min Assistance.   Outcome: Progressing Goal: RH STG MANAGE BOWEL W/MEDICATION W/ASSISTANCE Description STG Manage Bowel with Medication with mod I Assistance.  Outcome: Progressing   Problem: RH SAFETY Goal: RH STG ADHERE TO SAFETY PRECAUTIONS W/ASSISTANCE/DEVICE Description STG Adhere to Safety Precautions With cues, supervision Assistance/Device.  Outcome: Progressing   Problem: RH KNOWLEDGE DEFICIT Goal: RH STG INCREASE KNOWLEDGE OF DIABETES Description Pt and family will be able to explain rationale for and schedule for medications,  CBG checks and dietary restrictions using resources/cues/reminders min assist  Outcome: Progressing Goal: RH STG INCREASE KNOWLEDGE OF HYPERTENSION Description PT will be able to manage HTN with family using resources/cues/reminders/handouts min assist   Outcome: Progressing   

## 2018-07-05 NOTE — Progress Notes (Signed)
ANTICOAGULATION CONSULT NOTE - Follow Up Consult  Pharmacy Consult for Coumadin Indication: stroke  Allergies  Allergen Reactions  . Lipitor [Atorvastatin] Nausea And Vomiting    Patient Measurements: Height: 6\' 2"  (188 cm) Weight: 198 lb 3.1 oz (89.9 kg) IBW/kg (Calculated) : 82.2  Vital Signs: Temp: 98.5 F (36.9 C) (08/01 2045) Temp Source: Oral (08/01 2045) BP: 145/86 (08/01 2045) Pulse Rate: 76 (08/01 2045)  Labs: Recent Labs    07/03/18 0533 07/04/18 0307 07/05/18 0602  LABPROT 29.4* 30.9* 25.2*  INR 2.81 3.01 2.32    Estimated Creatinine Clearance: 90.6 mL/min (by C-G formula based on SCr of 0.97 mg/dL).  Assessment: 62 YO M on warfarin PTA for hx CVA-  INR 2.32 today, Patient did not receive warfarin yesterday.   Home warfarin regimen:  10 mg daily  Goal of Therapy:  INR 2-3 Monitor platelets by anticoagulation protocol: Yes   Plan:  Warfarin 7.5 mg po x 1 today Monitor daily INR, CBC, clinical course, s/sx of bleed, PO intake, DDI  Sire Poet A. Jeanella Craze, PharmD, BCPS Clinical Pharmacist [place] Pager: [phone] Please utilize Amion for appropriate phone number to reach the unit pharmacist [facility])   07/05/2018, 8:02 AM

## 2018-07-05 NOTE — Progress Notes (Signed)
Occupational Therapy Session Note  Patient Details  Name: Karl [name] MRN: [id number] Date of Birth: May 16, 1956  Today's Date: 07/05/2018 OT Individual Time: 0700-0757 OT Individual Time Calculation (min): 57 min    Short Term Goals: Week 2:  OT Short Term Goal 1 (Week 2): Pt will maintain standing balance at the sink with no more than CGA while performing grooming task. OT Short Term Goal 1 - Progress (Week 2): Met OT Short Term Goal 2 (Week 2): Pt will complete shower transfer onto bench with no more than CGA  OT Short Term Goal 2 - Progress (Week 2): Met OT Short Term Goal 3 (Week 2): Pt will utilize L UE during bathing tasks with no more than 3 verbal cues OT Short Term Goal 3 - Progress (Week 2): Met OT Short Term Goal 4 (Week 2): Pt will be given home fine motor program OT Short Term Goal 4 - Progress (Week 2): Met  Skilled Therapeutic Interventions/Progress Updates:    1:1. No c/o pain. Pt completes stand pivot transfer EOB>w/c<>BSC with CGA for steadying and VC for hand placement. Pt requires steadying A in standing for clohting management and lateral leans with superviison for posterior hygiene. Pt retrieves laundry fron dryer in standing with CGA wit weight bearing through LUE for NMR. Pt folds laundry in seated with supervision for LUE NMR and standing for blanace with steayding A. Pt able  To locate all laundry placed on L and requires min VC for reaching with LUE to obtain laundry from pile. Pt places clothing in drawers of dresser with forcedd use of LUE to  Therapy Documentation Precautions:  Precautions Precautions: Fall Precaution Comments: poor proprioceptive awareness of Lt  Restrictions Weight Bearing Restrictions: No General:   Vital Signs:   See Function Navigator for Current Functional Status.   Therapy/Group: Individual Therapy  Tonny Branch 07/05/2018, 7:27 AM

## 2018-07-06 ENCOUNTER — Inpatient Hospital Stay (HOSPITAL_COMMUNITY): Payer: Managed Care, Other (non HMO)

## 2018-07-06 ENCOUNTER — Ambulatory Visit (HOSPITAL_COMMUNITY): Payer: Managed Care, Other (non HMO)

## 2018-07-06 LAB — PROTIME-INR
INR: 1.95
Prothrombin Time: 22.1 seconds — ABNORMAL HIGH (ref 11.4–15.2)

## 2018-07-06 LAB — GLUCOSE, CAPILLARY
GLUCOSE-CAPILLARY: 142 mg/dL — AB (ref 70–99)
Glucose-Capillary: 145 mg/dL — ABNORMAL HIGH (ref 70–99)
Glucose-Capillary: 122 mg/dL — ABNORMAL HIGH (ref 70–99)
Glucose-Capillary: 126 mg/dL — ABNORMAL HIGH (ref 70–99)

## 2018-07-06 MED ORDER — WARFARIN SODIUM 5 MG PO TABS
10.0000 mg | ORAL_TABLET | Freq: Once | ORAL | Status: AC
  Administered 2018-07-06: 10 mg via ORAL
  Filled 2018-07-06: qty 2

## 2018-07-06 NOTE — Progress Notes (Signed)
[place] PHYSICAL MEDICINE & REHABILITATION     PROGRESS NOTE    Subjective/Complaints: Patient lying in bed.  No new complaints.  Making progress in therapies.  ROS: Patient denies fever, rash, sore throat, blurred vision, nausea, vomiting, diarrhea, cough, shortness of breath or chest pain, joint or back pain, headache, or mood change.    Objective:  No results found. No results for input(s): WBC, HGB, HCT, PLT in the last 72 hours. No results for input(s): NA, K, CL, GLUCOSE, BUN, CREATININE, CALCIUM in the last 72 hours.  Invalid input(s): CO CBG (last 3)  Recent Labs    07/05/18 1640 07/05/18 2121 07/06/18 0627  GLUCAP 218* 82 145*    Wt Readings from Last 3 Encounters:  07/05/18 89.9 kg (198 lb 3.1 oz)  06/14/18 105.2 kg (232 lb)  06/11/18 105.2 kg (232 lb)     Intake/Output Summary (Last 24 hours) at 07/06/2018 0734 Last data filed at 07/05/2018 2000 Gross per 24 hour  Intake 780 ml  Output 150 ml  Net 630 ml    Vital Signs: Blood pressure 128/78, pulse 71, temperature 98.7 F (37.1 C), temperature source Oral, resp. rate 17, height 6\' 2"  (1.88 m), weight 89.9 kg (198 lb 3.1 oz), SpO2 97 %. Physical Exam:  Constitutional: No distress . Vital signs reviewed. HEENT: EOMI, oral membranes moist Neck: supple Cardiovascular: RRR without murmur. No JVD    Respiratory: CTA Bilaterally without wheezes or rales. Normal effort    GI: BS +, non-tender, non-distended  Extremities: no edema Neurological:  Left central 7.  Continued decreased coordination in the left arm and leg. Motor:  5/5 RUE/RLE.  LUE 4-4+/5 prox to distal (showing some improvement in fine motor coordination).  LLE 4+/5 (stable). Psych: normal mood. Normal affect.  Assessment/Plan: 1. Functional and mobility deficits secondary to bilateral posterior circulation infarcts which require 3+ hours per day of interdisciplinary therapy in a comprehensive inpatient rehab setting. Physiatrist is  providing close team supervision and 24 hour management of active medical problems listed below. Physiatrist and rehab team continue to assess barriers to discharge/monitor patient progress toward functional and medical goals.  Function:  Bathing Bathing position Bathing activity did not occur: N/A Position: Shower  Bathing parts Body parts bathed by patient: Right arm, Left arm, Chest, Abdomen, Front perineal area, Right upper leg, Left upper leg, Buttocks, Left lower leg, Right lower leg Body parts bathed by helper: Right lower leg, Left lower leg, Buttocks  Bathing assist Assist Level: Supervision or verbal cues      Upper Body Dressing/Undressing Upper body dressing   What is the patient wearing?: Pull over shirt/dress     Pull over shirt/dress - Perfomed by patient: Thread/unthread left sleeve, Put head through opening, Pull shirt over trunk, Thread/unthread right sleeve          Upper body assist Assist Level: Supervision or verbal cues      Lower Body Dressing/Undressing Lower body dressing Lower body dressing/undressing activity did not occur: N/A What is the patient wearing?: Pants, Underwear, Socks, Shoes Underwear - Performed by patient: Thread/unthread left underwear leg, Pull underwear up/down, Thread/unthread right underwear leg   Pants- Performed by patient: Pull pants up/down, Thread/unthread left pants leg, Thread/unthread right pants leg Pants- Performed by helper: Fasten/unfasten pants Non-skid slipper socks- Performed by patient: Don/doff right sock, Don/doff left sock Non-skid slipper socks- Performed by helper: Don/doff left sock, Don/doff right sock Socks - Performed by patient: Don/doff right sock, Don/doff left sock  Shoes - Performed by patient: Don/doff right shoe, Don/doff left shoe, Fasten right, Fasten left            Lower body assist Assist for lower body dressing: Touching or steadying assistance (Pt > 75%)      Toileting Toileting    Toileting steps completed by patient: Adjust clothing prior to toileting, Performs perineal hygiene, Adjust clothing after toileting Toileting steps completed by helper: Adjust clothing after toileting Toileting Assistive Devices: Grab bar or rail  Toileting assist Assist level: Supervision or verbal cues   Transfers Chair/bed transfer   Chair/bed transfer method: Stand pivot Chair/bed transfer assist level: Touching or steadying assistance (Pt > 75%) Chair/bed transfer assistive device: Environmental consultant, Designer, fashion/clothing     Max distance: 53' Assist level: Touching or steadying assistance (Pt > 75%)   Wheelchair   Type: Manual Max wheelchair distance: 150 Assist Level: Touching or steadying assistance (Pt > 75%)  Cognition Comprehension Comprehension assist level: Understands basic 90% of the time/cues < 10% of the time  Expression Expression assist level: Expresses basic needs/ideas: With extra time/assistive device  Social Interaction Social Interaction assist level: Interacts appropriately 90% of the time - Needs monitoring or encouragement for participation or interaction.  Problem Solving Problem solving assist level: Solves basic 90% of the time/requires cueing < 10% of the time  Memory Memory assist level: Recognizes or recalls 90% of the time/requires cueing < 10% of the time   Medical Problem List and Plan:  1. 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 DVT Prophylaxis/Anticoagulation: Pharmaceutical: Coumadin       3. Pain Management: tylenol prn  4. Mood: LCSW to follow for evaluation and support.  5. Neuropsych: This patient is capable of making decisions on his own behalf.  6. Skin/Wound Care: routine pressure relief measures.  7. Fluids/Electrolytes/Nutrition: Monitor I/O.      8. CAD with ICM: Coumadin with INR goal 2-3.  Continue coreg and Crestor.  9.T2DM: Metformin increased to 850mg  bid (PTA 1000 twice a day) CBG (last 3)  Recent Labs    07/05/18 1640 07/05/18 2121 07/06/18 0627  GLUCAP 218* 82 145*   Increased metformin to home dose of 1000mg  bid   -improved control overall until yesterday.  Observe for now 10. Hypokalemia:  resolved 11. HTN: Monitor BP bid--on coreg bid. Off Vasotec at this time    Vitals:   07/05/18 2037 07/06/18 0540  BP: 127/77 128/78  Pulse: 69 71  Resp: 17 17  Temp: 98.1 F (36.7 C) 98.7 F (37.1 C)  SpO2: 98% 97%   Improved control 12. TURP/BPH: On Proscar and Flomax.   Controlled 13. Hyponatremia  Sodium 137 on 7/29--recheck on Monday  Continue to monitor 14. Acute lower UTI  UA +, urine culture with multiple species  -Macrobid completed  LOS (Days) 18 A FACE TO FACE EVALUATION WAS PERFORMED  Ranelle Oyster, MD 07/06/2018 7:34 AM

## 2018-07-06 NOTE — Progress Notes (Signed)
Occupational Therapy Session Note  Patient Details  Name: Karl [name] MRN: [id number] Date of Birth: 07/04/1956  Today's Date: 07/06/2018 OT Individual Time: 0915-0958 OT Individual Time Calculation (min): 43 min    Short Term Goals: Week 3:  OT Short Term Goal 1 (Week 3): STG=LTG 2/2 ELOS  Skilled Therapeutic Interventions/Progress Updates:    1;1. Pt with no c/o pain. Pt agreeable to bathing and dressing at shower level. Pt completes stand pivot transfer with supervision using grab bat to transfer onto TTB. Pt bathes with A to wash back only leaning laterally to wash buttocks. Pt dresses with set up for shirt seated in w/c and supervision for LB clothing. Pt with total LOB posteriorly and OT guides hips into chair. Educated pt on importance of 1UE suport on sink or RW when advancing pants past hips. Pt grooms at sink with VC for locating toothpaste on L of sink. exited session with pt seated in w/c, belt alarm on, call light in reach and all needs met  Therapy Documentation Precautions:  Precautions Precautions: Fall Precaution Comments: poor proprioceptive awareness of Lt  Restrictions Weight Bearing Restrictions: No General:   Vital Signs:   Pain: Pain Assessment Pain Scale: 0-10 Pain Score: 0-No pain  See Function Navigator for Current Functional Status.   Therapy/Group: Individual Therapy   M  07/06/2018, 9:52 AM  

## 2018-07-06 NOTE — Progress Notes (Signed)
ANTICOAGULATION CONSULT NOTE - Follow Up Consult  Pharmacy Consult for Coumadin Indication: stroke  Patient Measurements: Height: 6\' 2"  (188 cm) Weight: 198 lb 3.1 oz (89.9 kg) IBW/kg (Calculated) : 82.2  Vital Signs: Temp: 98.7 F (37.1 C) (08/03 0540) Temp Source: Oral (08/03 0540) BP: 128/78 (08/03 0540) Pulse Rate: 71 (08/03 0540)  Labs: Recent Labs    07/04/18 0307 07/05/18 0602 07/06/18 0539  LABPROT 30.9* 25.2* 22.1*  INR 3.01 2.32 1.95    Estimated Creatinine Clearance: 90.6 mL/min (by C-G formula based on SCr of 0.97 mg/dL).  Assessment:  62 yr old male on Coumadin as prior to admission for stroke.   INR is just below therapeutic range (1.95) after Coumadin 7.5 mg x 1 on 8/2.  Did not receive Coumadin on 8/1 when INR 3.01.   Home Coumadin regimen: 10 mg daily;   INR subtherapeutic on admit 7/12 (1.16).  Coumadin had been held for prostate surgery on 7/3, unsure when resumed.  Goal of Therapy:  INR 2-3 Monitor platelets by anticoagulation protocol: Yes   Plan:   Coumadin 10 mg x 1 today.  Continue daily PT/INR.  Dennie Fetters, RPh Pager: [phone] or phone: [phone] 07/06/2018,10:35 AM

## 2018-07-06 NOTE — Progress Notes (Signed)
Speech Language Pathology Daily Session Note  Patient Details  Name: Karl [name] MRN: [id number] Date of Birth: [birth year]  Today's Date: 07/06/2018 SLP Individual Time: 1100-1156 SLP Individual Time Calculation (min): 56 min  Short Term Goals: Week 3: SLP Short Term Goal 1 (Week 3): STGs=LTGs  Skilled Therapeutic Interventions: Skilled ST services focused on swallow and cognitive skills. SLP facilitated PO consumption of regular trial lunch tray, pt demonstrated prolonged mastication due to missing denition, however demonstrated oral clearance and no overt s/s aspiration. SLP upgraded to regular textured diet with continued intermittent supervision A. SLP facilitated semi-complex problem solving skils utilizing A:FA medication management, pt required supervision A questions cues. Pt demonstrated recall of previous therapy session events including, 3 recall items to be retrieved from gift shop, with extra time and Mod I. Pt was left in room with call bell within reach and chair alaram set. ST reccomends to continue skilled ST services.  Function:  Eating Eating   Modified Consistency Diet: No Eating Assist Level: Set up assist for   Eating Set Up Assist For: Opening containers       Cognition Comprehension Comprehension assist level: Follows basic conversation/direction with no assist  Expression   Expression assist level: Expresses basic needs/ideas: With extra time/assistive device  Social Interaction Social Interaction assist level: Interacts appropriately 90% of the time - Needs monitoring or encouragement for participation or interaction.  Problem Solving Problem solving assist level: Solves complex 90% of the time/cues < 10% of the time  Memory Memory assist level: More than reasonable amount of time    Pain Pain Assessment Pain Score: 0-No pain  Therapy/Group: Individual Therapy  [name]  [facility] 07/06/2018, 3:43 PM

## 2018-07-06 NOTE — Plan of Care (Signed)
  Problem: Consults Goal: RH STROKE PATIENT EDUCATION Description See Patient Education module for education specifics  Outcome: Progressing   Problem: RH BOWEL ELIMINATION Goal: RH STG MANAGE BOWEL WITH ASSISTANCE Description STG Manage Bowel with  Min Assistance.   Outcome: Progressing Goal: RH STG MANAGE BOWEL W/MEDICATION W/ASSISTANCE Description STG Manage Bowel with Medication with mod I Assistance.  Outcome: Progressing   Problem: RH SAFETY Goal: RH STG ADHERE TO SAFETY PRECAUTIONS W/ASSISTANCE/DEVICE Description STG Adhere to Safety Precautions With cues, supervision Assistance/Device.  Outcome: Progressing   Problem: RH KNOWLEDGE DEFICIT Goal: RH STG INCREASE KNOWLEDGE OF DIABETES Description Pt and family will be able to explain rationale for and schedule for medications,  CBG checks and dietary restrictions using resources/cues/reminders min assist  Outcome: Progressing Goal: RH STG INCREASE KNOWLEDGE OF HYPERTENSION Description PT will be able to manage HTN with family using resources/cues/reminders/handouts min assist   Outcome: Progressing   

## 2018-07-07 ENCOUNTER — Inpatient Hospital Stay (HOSPITAL_COMMUNITY): Payer: Managed Care, Other (non HMO)

## 2018-07-07 LAB — GLUCOSE, CAPILLARY
GLUCOSE-CAPILLARY: 199 mg/dL — AB (ref 70–99)
Glucose-Capillary: 114 mg/dL — ABNORMAL HIGH (ref 70–99)
Glucose-Capillary: 131 mg/dL — ABNORMAL HIGH (ref 70–99)
Glucose-Capillary: 91 mg/dL (ref 70–99)

## 2018-07-07 LAB — PROTIME-INR
INR: 2.13
Prothrombin Time: 23.6 seconds — ABNORMAL HIGH (ref 11.4–15.2)

## 2018-07-07 MED ORDER — WARFARIN SODIUM 7.5 MG PO TABS
7.5000 mg | ORAL_TABLET | Freq: Once | ORAL | Status: AC
  Administered 2018-07-07: 7.5 mg via ORAL
  Filled 2018-07-07: qty 1

## 2018-07-07 NOTE — Progress Notes (Signed)
[facility]ICAL [facility]ICINE & REHABILITATION     PROGRESS NOTE    Subjective/Complaints: Patient slept well last night.  No new problems  ROS: Patient denies fever, rash, sore throat, blurred vision, nausea, vomiting, diarrhea, cough, shortness of breath or chest pain, joint or back pain, headache, or mood change.    Objective:  No results found. No results for input(s): WBC, HGB, HCT, PLT in the last 72 hours. No results for input(s): NA, K, CL, GLUCOSE, BUN, CREATININE, CALCIUM in the last 72 hours.  Invalid input(s): CO CBG (last 3)  Recent Labs    07/06/18 1625 07/06/18 2124 07/07/18 0628  GLUCAP 126* 142* 114*    Wt Readings from Last 3 Encounters:  07/05/18 89.9 kg (198 lb 3.1 oz)  06/14/18 105.2 kg (232 lb)  06/11/18 105.2 kg (232 lb)     Intake/Output Summary (Last 24 hours) at 07/07/2018 0809 Last data filed at 07/06/2018 2010 Gross per 24 hour  Intake 864 ml  Output -  Net 864 ml    Vital Signs: Blood pressure (!) 147/82, pulse 76, temperature 98.2 F (36.8 C), temperature source Oral, resp. rate 20, height 6\' 2"  (1.88 m), weight 89.9 kg (198 lb 3.1 oz), SpO2 98 %. Physical Exam:  Constitutional: No distress . Vital signs reviewed. HEENT: EOMI, oral membranes moist Neck: supple Cardiovascular: RRR without murmur. No JVD    Respiratory: CTA Bilaterally without wheezes or rales. Normal effort    GI: BS +, non-tender, non-distended  Extremities: no edema Neurological:  Left central 7.  Continued decreased coordination in the left arm and leg. Motor:  5/5 RUE/RLE.  LUE 4-4+/5 prox to distal (showing some improvement in fine motor coordination).  LLE 4+/5 (stable). Psych: normal mood. Normal affect.  Assessment/Plan: 1. Functional and mobility deficits secondary to bilateral posterior circulation infarcts which require 3+ hours per day of interdisciplinary therapy in a comprehensive inpatient rehab setting. Physiatrist is providing close team  supervision and 24 hour management of active medical problems listed below. Physiatrist and rehab team continue to assess barriers to discharge/monitor patient progress toward functional and medical goals.  Function:  Bathing Bathing position Bathing activity did not occur: N/A Position: Shower  Bathing parts Body parts bathed by patient: Right arm, Left arm, Chest, Abdomen, Front perineal area, Right upper leg, Left upper leg, Buttocks, Left lower leg, Right lower leg Body parts bathed by helper: Right lower leg, Left lower leg, Buttocks  Bathing assist Assist Level: Supervision or verbal cues      Upper Body Dressing/Undressing Upper body dressing   What is the patient wearing?: Pull over shirt/dress     Pull over shirt/dress - Perfomed by patient: Thread/unthread left sleeve, Put head through opening, Pull shirt over trunk, Thread/unthread right sleeve          Upper body assist Assist Level: Supervision or verbal cues      Lower Body Dressing/Undressing Lower body dressing Lower body dressing/undressing activity did not occur: N/A What is the patient wearing?: Pants, Underwear, Socks, Shoes Underwear - Performed by patient: Thread/unthread left underwear leg, Pull underwear up/down, Thread/unthread right underwear leg   Pants- Performed by patient: Pull pants up/down, Thread/unthread left pants leg, Thread/unthread right pants leg Pants- Performed by helper: Fasten/unfasten pants Non-skid slipper socks- Performed by patient: Don/doff right sock, Don/doff left sock Non-skid slipper socks- Performed by helper: Don/doff left sock, Don/doff right sock Socks - Performed by patient: Don/doff right sock, Don/doff left sock   Shoes - Performed by  patient: Don/doff right shoe, Don/doff left shoe, Fasten right, Fasten left            Lower body assist Assist for lower body dressing: Touching or steadying assistance (Pt > 75%)      Toileting Toileting   Toileting steps  completed by patient: Adjust clothing prior to toileting, Performs perineal hygiene, Adjust clothing after toileting Toileting steps completed by helper: Adjust clothing after toileting, Adjust clothing prior to toileting Toileting Assistive Devices: Grab bar or rail  Toileting assist Assist level: Touching or steadying assistance (Pt.75%)   Transfers Chair/bed transfer   Chair/bed transfer method: Stand pivot Chair/bed transfer assist level: Touching or steadying assistance (Pt > 75%) Chair/bed transfer assistive device: Walker, Designer, fashion/clothing     Max distance: 59' Assist level: Touching or steadying assistance (Pt > 75%)   Wheelchair   Type: Manual Max wheelchair distance: 150 Assist Level: Touching or steadying assistance (Pt > 75%)  Cognition Comprehension Comprehension assist level: Follows basic conversation/direction with no assist  Expression Expression assist level: Expresses basic needs/ideas: With extra time/assistive device  Social Interaction Social Interaction assist level: Interacts appropriately 90% of the time - Needs monitoring or encouragement for participation or interaction.  Problem Solving Problem solving assist level: Solves basic 90% of the time/requires cueing < 10% of the time  Memory Memory assist level: Recognizes or recalls 90% of the time/requires cueing < 10% of the time   Medical Problem List and Plan:  1. 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 DVT Prophylaxis/Anticoagulation: Pharmaceutical: Coumadin       3. Pain Management: tylenol prn  4. Mood: LCSW to follow for evaluation and support.  5. Neuropsych: This patient is capable of making decisions on his own behalf.  6. Skin/Wound Care: routine pressure relief measures.  7. Fluids/Electrolytes/Nutrition: Monitor I/O.      8. CAD with ICM:  Coumadin with INR goal 2-3. Continue coreg and Crestor.  9.T2DM: Metformin increased to 850mg  bid (PTA 1000 twice a day) CBG (last 3)  Recent Labs    07/06/18 1625 07/06/18 2124 07/07/18 0628  GLUCAP 126* 142* 114*   Increased metformin to home dose of 1000mg  bid   -improved control overall 10. Hypokalemia:  resolved 11. HTN: Monitor BP bid--on coreg bid. Off Vasotec at this time    Vitals:   07/06/18 2021 07/07/18 0548  BP: 137/81 (!) 147/82  Pulse: 68 76  Resp: 18 20  Temp: 99.1 F (37.3 C) 98.2 F (36.8 C)  SpO2: 99% 98%   Improved control 12. TURP/BPH: On Proscar and Flomax.   Controlled 13. Hyponatremia  Sodium 137 on 7/29--recheck on Monday  Continue to monitor 14. Acute lower UTI  UA +, urine culture with multiple species  -Macrobid completed  LOS (Days) 19 A FACE TO FACE EVALUATION WAS PERFORMED  Ranelle Oyster, MD 07/07/2018 8:09 AM

## 2018-07-07 NOTE — Progress Notes (Signed)
Occupational Therapy Session Note  Patient Details  Name: Karl [name] MRN: [id number] Date of Birth: 02/24/1956  Today's Date: 07/07/2018 OT Individual Time: 0930-1043 OT Individual Time Calculation (min): 73 min    Short Term Goals: Week 3:  OT Short Term Goal 1 (Week 3): STG=LTG 2/2 ELOS  Skilled Therapeutic Interventions/Progress Updates:    1;1. Pt with no c/o pain this session. Pt requesting to change clothing and bathe the "hot spots."Pt completes squat pivot transfer with supervision EOB>w/c and stand pivot transfer with grab bar to BSC over toilet. PT has small BM and bladder void with supervision for lateral leans hygiene and clothing management in standing. Pt washes hands/UB with set up. Pt able to scan L for clothing items without VC. Pt dons all cltohign with supervision at sit to stand level at sink with VC for slightly narrower BOS during sit to stand transition. Pt completes 3x10 reps standing ball toss to rebounder trampoline with Min-mod A for standing balance d/t posterior bias with no UE support. Pt able to adjust even weight distribution with VC in standing. Pt requires increased rest breaks after each set. PT stands at sink in ADL kitchen with CGA while reaching with LUE for NMR to high shelves to retrieve glasses and cross midline to place on counter for trunk elongation, L NMR  With scapular mobilization and weight bearing on BLE. Exited session wihtpt seated in w/c with call light in reach and all needs met Therapy Documentation Precautions:  Precautions Precautions: Fall Precaution Comments: poor proprioceptive awareness of Lt  Restrictions Weight Bearing Restrictions: No General:  See Function Navigator for Current Functional Status.   Therapy/Group: Individual Therapy   M  07/07/2018, 9:38 AM 

## 2018-07-07 NOTE — Progress Notes (Signed)
ANTICOAGULATION CONSULT NOTE - Follow Up Consult  Pharmacy Consult for Coumadin Indication: stroke  Patient Measurements: Height: 6\' 2"  (188 cm) Weight: 198 lb 3.1 oz (89.9 kg) IBW/kg (Calculated) : 82.2  Vital Signs: Temp: Karl.2 F (36.8 C) (08/04 0548) Temp Source: Oral (08/04 0548) BP: 147/82 (08/04 0548) Pulse Rate: 76 (08/04 0548)  Labs: Recent Labs    07/05/18 0602 07/06/18 0539 07/07/18 0603  LABPROT 25.2* 22.1* 23.6*  INR 2.32 1.95 2.13  ecmd:icu?ACT=BACK&HIDECOMPLETED=&INC=[id number]&LOADTIME=[id number]&PATIENTDAT=[id number]&PATIENTID=++[id number]&REPWIDTH=1096&[phone]&[phone]&reset=  Estimated Creatinine Clearance: 90.6 mL/min (by C-G formula based on SCr of 0.97 mg/dL).  Assessment:  62 yr old Karl [name] on Coumadin as prior to admission for stroke.   INR is back into therapeutic range (2.13) after Coumadin 10 mg on 8/3.  Has sometimes been sensitive to multiple consecutive 10 mg doses this admission.   Home Coumadin regimen: 10 mg daily;   INR subtherapeutic on admit 7/12 (1.16).  Coumadin had been held for prostate surgery on 7/3, unsure when resumed.  Goal of Therapy:  INR 2-3 Monitor platelets by anticoagulation protocol: Yes   Plan:   Coumadin 7.5 mg x 1 today.  Continue daily PT/INR.  May need Coumadin 10 mg some days and 7.5 mg some days going forward.  Dennie Fetters, RPh Pager: [phone] or phone: [phone] 07/07/2018,10:16 AM

## 2018-07-07 NOTE — Plan of Care (Signed)
  Problem: Consults Goal: RH STROKE PATIENT EDUCATION Description See Patient Education module for education specifics  Outcome: Progressing   Problem: RH BOWEL ELIMINATION Goal: RH STG MANAGE BOWEL WITH ASSISTANCE Description STG Manage Bowel with  Min Assistance.   Outcome: Progressing Goal: RH STG MANAGE BOWEL W/MEDICATION W/ASSISTANCE Description STG Manage Bowel with Medication with mod I Assistance.  Outcome: Progressing   Problem: RH SAFETY Goal: RH STG ADHERE TO SAFETY PRECAUTIONS W/ASSISTANCE/DEVICE Description STG Adhere to Safety Precautions With cues, supervision Assistance/Device.  Outcome: Progressing   Problem: RH KNOWLEDGE DEFICIT Goal: RH STG INCREASE KNOWLEDGE OF DIABETES Description Pt and family will be able to explain rationale for and schedule for medications,  CBG checks and dietary restrictions using resources/cues/reminders min assist  Outcome: Progressing Goal: RH STG INCREASE KNOWLEDGE OF HYPERTENSION Description PT will be able to manage HTN with family using resources/cues/reminders/handouts min assist   Outcome: Progressing

## 2018-07-08 ENCOUNTER — Ambulatory Visit (HOSPITAL_COMMUNITY): Payer: Managed Care, Other (non HMO) | Admitting: Physical Therapy

## 2018-07-08 ENCOUNTER — Inpatient Hospital Stay (HOSPITAL_COMMUNITY): Payer: Managed Care, Other (non HMO)

## 2018-07-08 ENCOUNTER — Inpatient Hospital Stay (HOSPITAL_COMMUNITY): Payer: Managed Care, Other (non HMO) | Admitting: Occupational Therapy

## 2018-07-08 ENCOUNTER — Inpatient Hospital Stay (HOSPITAL_COMMUNITY): Payer: Managed Care, Other (non HMO) | Admitting: Speech Pathology

## 2018-07-08 LAB — BASIC METABOLIC PANEL
Anion gap: 8 (ref 5–15)
BUN: 7 mg/dL — AB (ref 8–23)
CHLORIDE: 106 mmol/L (ref 98–111)
CO2: 26 mmol/L (ref 22–32)
Calcium: 9.1 mg/dL (ref 8.9–10.3)
Creatinine, Ser: 0.96 mg/dL (ref 0.61–1.24)
GFR calc Af Amer: >60 mL/min (ref >60)
GFR calc non Af Amer: >60 mL/min (ref >60)
GLUCOSE: 126 mg/dL — AB (ref 70–99)
POTASSIUM: 3.7 mmol/L (ref 3.5–5.1)
Sodium: 140 mmol/L (ref 135–145)

## 2018-07-08 LAB — CBC
HEMATOCRIT: 38.7 % — AB (ref 39.0–52.0)
Hemoglobin: 12 g/dL — ABNORMAL LOW (ref 13.0–17.0)
MCH: 24.2 pg — ABNORMAL LOW (ref 26.0–34.0)
MCHC: 31 g/dL (ref 30.0–36.0)
MCV: 78 fL (ref 78.0–100.0)
Platelets: 218 10*3/uL (ref 150–400)
RBC: 4.96 MIL/uL (ref 4.22–5.81)
RDW: 14.3 % (ref 11.5–15.5)
WBC: 4.8 10*3/uL (ref 4.0–10.5)

## 2018-07-08 LAB — PROTIME-INR
INR: 2.39
Prothrombin Time: 25.9 seconds — ABNORMAL HIGH (ref 11.4–15.2)

## 2018-07-08 LAB — GLUCOSE, CAPILLARY
GLUCOSE-CAPILLARY: 112 mg/dL — AB (ref 70–99)
Glucose-Capillary: 124 mg/dL — ABNORMAL HIGH (ref 70–99)
Glucose-Capillary: 155 mg/dL — ABNORMAL HIGH (ref 70–99)
Glucose-Capillary: 129 mg/dL — ABNORMAL HIGH (ref 70–99)

## 2018-07-08 MED ORDER — WARFARIN SODIUM 7.5 MG PO TABS
7.5000 mg | ORAL_TABLET | Freq: Once | ORAL | Status: AC
  Administered 2018-07-08: 7.5 mg via ORAL
  Filled 2018-07-08: qty 1

## 2018-07-08 NOTE — Progress Notes (Signed)
Physical Therapy Session Note  Patient Details  Name: Karl [name] MRN: [id number] Date of Birth: 05/31/1956  Today's Date: 07/08/2018 PT Individual Time: 1515-1625 PT Individual Time Calculation (min): 70 min   Short Term Goals: Week 3:  PT Short Term Goal 1 (Week 3): = LTG  Skilled Therapeutic Interventions/Progress Updates:    Pt seated in w/c upon PT arrival, agreeable to therapy tx and denies pain. Pt transported to the gym in w/c. Pt participated in berg balance test as detailed below, pt scored 30/56 and therapist discussed results with the pt and emphasized use of RW at home to decrease fall risk. Pt ambulated x 171 ft working on endurance and activity tolerance with gait, therapist providing cues for symmetric step length, supervision and increased cueing during turns. Pt ambulated x 15 ft to the steps and ascended/descended 4 steps with B handrails and min assist, step to pattern with verbal cues for techniques. Pt ambulated x 60 ft back to w/c with RW and supervision. Pt transported to the dayroom. Pt performed stand pivot transfer from w/c<>nustep this session with supervision. Pt used nustep x 8 minutes on workload 6 for global strengthening and endurance. Pt transported back to room and left seated with needs in reach and QRB in place.   Therapy Documentation Precautions:  Precautions Precautions: Fall Precaution Comments: poor proprioceptive awareness of Lt  Restrictions Weight Bearing Restrictions: No   Standardized Balance Assessment: Berg Balance Test Sit to Stand: Able to stand  independently using hands Standing Unsupported: Able to stand 2 minutes with supervision Sitting with Back Unsupported but Feet Supported on Floor or Stool: Able to sit safely and securely 2 minutes Stand to Sit: Controls descent by using hands Transfers: Able to transfer safely, definite need of hands Standing Unsupported with Eyes Closed: Able to stand 10 seconds with supervision Standing  Ubsupported with Feet Together: Needs help to attain position and unable to hold for 15 seconds From Standing, Reach Forward with Outstretched Arm: Can reach forward >5 cm safely (2") From Standing Position, Pick up Object from Floor: Able to pick up shoe, needs supervision From Standing Position, Turn to Look Behind Over each Shoulder: Turn sideways only but maintains balance Turn 360 Degrees: Able to turn 360 degrees safely but slowly Standing Unsupported, Alternately Place Feet on Step/Stool: Able to complete >2 steps/needs minimal assist Standing Unsupported, One Foot in Front: Loses balance while stepping or standing Standing on One Leg: Tries to lift leg/unable to hold 3 seconds but remains standing independently Total Score: 30   See Function Navigator for Current Functional Status.   Therapy/Group: Individual Therapy  Cresenciano Genre, PT, DPT 07/08/2018, 7:33 AM

## 2018-07-08 NOTE — Progress Notes (Signed)
ANTICOAGULATION CONSULT NOTE - Follow Up Consult  Pharmacy Consult for Coumadin Indication: stroke  Patient Measurements: Height: 6\' 2"  (188 cm) Weight: 198 lb 3.1 oz (89.9 kg) IBW/kg (Calculated) : 82.2  Vital Signs: Temp: 98.2 F (36.8 C) (08/05 0540) BP: 161/80 (08/05 0912) Pulse Rate: 80 (08/05 0912)  Labs: Recent Labs    07/06/18 0539 07/07/18 0603 07/08/18 0527  HGB  --   --  12.0*  HCT  --   --  38.7*  PLT  --   --  218  LABPROT 22.1* 23.6* 25.9*  INR 1.95 2.13 2.39  CREATININE  --   --  0.96    Estimated Creatinine Clearance: 91.6 mL/min (by C-G formula based on SCr of 0.96 mg/dL).  Assessment:  62 yr old [name] on Coumadin as prior to admission for stroke.   INR therapeutic x 2 days (2.39 today), after Coumadin Karl mg on 8/3 and 7.5 mg on 8/4  Has sometimes been sensitive to multiple consecutive Karl mg doses this admission.  No Coumadin given on 8/1 when INR 3.01 and INR dropped to 1.95 on 8/3.   Home Coumadin regimen: Karl mg daily;   INR subtherapeutic on admit 7/12 (1.16).  Coumadin had been held for prostate surgery on 7/3, unsure when resumed.  Goal of Therapy:  INR 2-3 Monitor platelets by anticoagulation protocol: Yes   Plan:   Coumadin 7.5 mg again today.  Continue daily PT/INR.  May need Coumadin Karl mg some days and 7.5 mg some days going forward.  Suggest prescribing 5 mg tablets to allow flexibility with doses.  Dennie Fetters, RPh Pager: [phone] or phone: [phone] 07/08/2018,Karl:14 AM

## 2018-07-08 NOTE — Progress Notes (Signed)
Physical Therapy Discharge Summary  Patient Details  Name: Karl [name] MRN: [id number] Date of Birth: 11-01-1956  Today's Date: 07/08/2018 PT Individual Time: 4818-5631 PT Individual Time Calculation (min): 40 min   Pt participated in pt/family education with daughter and grandson.  Pt's daughter performed hands on training and was able to provide supervision for gait with RW, simulated car transfers and stand pivot transfers throughout session with RW.  Pt's daughter was educated on and able to perform min A for stepping up 1 step with RW to simulate home entry.  Pt/family educated on w/c parts management, energy conservation and home safety ideas, all express understanding.  Pt performs w/c mobility with UEs and LEs with supervision x 150'.  Pt and family state the feel ready to d/c at this level of care.   Patient has met 10 of 10 long term goals due to improved activity tolerance, improved balance, improved postural control, increased strength, ability to compensate for deficits, functional use of  left upper extremity and left lower extremity and improved coordination.  Patient to discharge at an ambulatory level Supervision.   Patient's care partner is independent to provide the necessary physical and cognitive assistance at discharge.  Reasons goals not met: n/a  Recommendation:  Patient will benefit from ongoing skilled PT services in home health setting to continue to advance safe functional mobility, address ongoing impairments in gait, balance, and minimize fall risk.  Equipment: RW, w/c  Reasons for discharge: treatment goals met and discharge from hospital  Patient/family agrees with progress made and goals achieved: Yes  PT Discharge Precautions/Restrictions Precautions Precautions: Fall Restrictions Weight Bearing Restrictions: No Pain Pain Assessment Pain Score: 0-No pain  Cognition Arousal/Alertness: Awake/alert Orientation Level: Oriented  X4 Sensation Sensation Central sensation comments: mild parathesia in the LUE and LLE with decreased appreciation to light touch Proprioception: Impaired Detail Proprioception Impaired Details: Impaired LLE;Impaired LUE Coordination Gross Motor Movements are Fluid and Coordinated: No Coordination and Movement Description: Decreased smoothness and accuracy with Lt Motor  Motor Motor: Hemiplegia;Motor perseverations;Abnormal postural alignment and control Motor - Discharge Observations: Lt sided weakness  Mobility Bed Mobility Supine to Sit: Independent Sit to Supine: Independent Transfers Sit to Stand: Supervision/Verbal cueing Stand to Sit: Supervision/Verbal cueing Stand Pivot Transfers: Supervision/Verbal cueing Transfer (Assistive device): Rolling walker Locomotion  Gait Gait Assistance: Supervision/Verbal cueing Gait Distance (Feet): 75 Feet Assistive device: Rolling walker Stairs / Additional Locomotion Stairs Assistance: Minimal Assistance - Patient > 75% Stair Management Technique: With walker Number of Stairs: 1 Wheelchair Mobility Wheelchair Mobility: Yes Wheelchair Assistance: Independent with assistive device Wheelchair Propulsion: Both upper extremities;Both lower extermities Wheelchair Parts Management: Supervision/cueing Distance: 150  Trunk/Postural Assessment  Cervical Assessment Cervical Assessment: (fwd head) Thoracic Assessment Thoracic Assessment: Within Functional Limits Lumbar Assessment Lumbar Assessment: (posterior pelvic tilt) Postural Control Righting Reactions: delayed  Balance Balance Balance Assessed: Yes Standardized Balance Assessment Standardized Balance Assessment: Berg Balance Test Berg Balance Test Sit to Stand: Able to stand  independently using hands Standing Unsupported: Able to stand 2 minutes with supervision Sitting with Back Unsupported but Feet Supported on Floor or Stool: Able to sit safely and securely 2 minutes Stand  to Sit: Controls descent by using hands Transfers: Able to transfer safely, definite need of hands Standing Unsupported with Eyes Closed: Able to stand 10 seconds with supervision Standing Ubsupported with Feet Together: Needs help to attain position and unable to hold for 15 seconds From Standing, Reach Forward with Outstretched Arm: Can reach forward >5 cm safely (  2") From Standing Position, Pick up Object from Floor: Able to pick up shoe, needs supervision From Standing Position, Turn to Look Behind Over each Shoulder: Turn sideways only but maintains balance Turn 360 Degrees: Able to turn 360 degrees safely but slowly Standing Unsupported, Alternately Place Feet on Step/Stool: Able to complete >2 steps/needs minimal assist Standing Unsupported, One Foot in Front: Loses balance while stepping or standing Standing on One Leg: Tries to lift leg/unable to hold 3 seconds but remains standing independently Total Score: 30 Static Sitting Balance Static Sitting - Level of Assistance: 7: Independent Dynamic Sitting Balance Dynamic Sitting - Balance Support: During functional activity Dynamic Sitting - Level of Assistance: 7: Independent Static Standing Balance Static Standing - Balance Support: During functional activity Static Standing - Level of Assistance: 5: Stand by assistance Dynamic Standing Balance Dynamic Standing - Balance Support: During functional activity Dynamic Standing - Level of Assistance: 5: Stand by assistance Extremity Assessment  RLE Assessment RLE Assessment: Within Functional Limits LLE Assessment General Strength Comments: grossly 4/5   See Function Navigator for Current Functional Status.  Netta Corrigan, PT, DPT 07/08/18  4:08 PM   DONAWERTH,KAREN 07/08/2018, 11:29 AM

## 2018-07-08 NOTE — Progress Notes (Signed)
Occupational Therapy Discharge Summary  Patient Details  Name: Karl [name] MRN: [id number] Date of Birth: 1956/04/16  Today's Date: 07/08/2018 OT Individual Time: 0488-8916 OT Individual Time Calculation (min): 60 min  OT treatment session focused on increased independence with BADL tasks and pt/family education. Family present for last 1/2 of OT session. Pt stood from EOB with supervision and increased time, then ambulated into bathroom with close supervision and RW. Supervision and verbal cues for RW placement when turning to sit on commode. Pt voided bladder and bowel. Pt able to completed peri-care without assistance, then needed supervision for clothing management. Bathing completed shower level w/ set-up A and increased time. Dressing also completed with set-up/supervision assist. Educated pt's family on home modifications for safe BADL participation and practiced tub shower transfer and ambulation w/ RW in and out of bathroom. Family demonstrated good understanding of pt care. Pt left seated in wc at end of session with family present and needs met.    Patient has met 12 of 12 long term goals due to improved activity tolerance, improved balance, postural control, ability to compensate for deficits, functional use of  LEFT upper and LEFT lower extremity, improved attention, improved awareness and improved coordination.  Patient to discharge at overall Supervision level.  Patient's care partner is independent to provide the necessary physical and cognitive assistance at discharge.    Reasons goals not met: n/a   Recommendation:  Patient will benefit from ongoing skilled OT services in outpatient setting to continue to advance functional skills in the area of BADL and functional use of LUE.  Equipment: RW, 3-in-1 BSC, tub transfer bench  Reasons for discharge: treatment goals met and discharge from hospital  Patient/family agrees with progress made and goals achieved: Yes  OT  Discharge Precautions/Restrictions  Precautions Precautions: Fall Precaution Comments: L hemiplegia Restrictions Weight Bearing Restrictions: No Pain Pain Assessment Pain Score: 0-No pain ADL ADL Grooming: Independent Upper Body Bathing: Supervision/safety Lower Body Bathing: Supervision/safety Upper Body Dressing: Setup Lower Body Dressing: Supervision/safety Toileting: Supervision/safety Toilet Transfer: Close supervision Tub/Shower Transfer: Close supervison ADL Comments: Please see functional navigator Vision Eye Alignment: Within Functional Limits Perception  Perception: Impaired Comments: Continues to have some L side neglect, but improved since eval Praxis Praxis: Impaired Praxis-Other Comments: Improved since eval Cognition Arousal/Alertness: Awake/alert Orientation Level: Oriented X4 Safety/Judgment: Impaired Sensation Sensation Light Touch: Impaired Detail Central sensation comments:  LUE and LLE with decreased appreciation to light touch Light Touch Impaired Details: Impaired LLE;Impaired LUE Proprioception: Impaired Detail Proprioception Impaired Details: Impaired LLE;Impaired LUE Additional Comments: Slightly improved since eval per pt report Coordination Gross Motor Movements are Fluid and Coordinated: No Fine Motor Movements are Fluid and Coordinated: No Coordination and Movement Description: Decreased smoothness and accuracy with Lt Finger Nose Finger Test: ataxia and overshooting on L- improved since eval Motor  Motor Motor: Hemiplegia Motor - Discharge Observations: Lt sided weakness Mobility  Bed Mobility Supine to Sit: Independent Sit to Supine: Independent Transfers Sit to Stand: Supervision/Verbal cueing Stand to Sit: Supervision/Verbal cueing  Trunk/Postural Assessment  Cervical Assessment Cervical Assessment: (fwd head) Thoracic Assessment Thoracic Assessment: Within Functional Limits Lumbar Assessment Lumbar Assessment:  (posterior pelvic tilt) Postural Control Righting Reactions: delayed  Balance Balance Balance Assessed: Yes Static Sitting Balance Static Sitting - Level of Assistance: 7: Independent Dynamic Sitting Balance Dynamic Sitting - Balance Support: During functional activity Dynamic Sitting - Level of Assistance: 7: Independent Static Standing Balance Static Standing - Balance Support: During functional activity Static Standing - Level  of Assistance: 5: Stand by assistance Dynamic Standing Balance Dynamic Standing - Balance Support: During functional activity Dynamic Standing - Level of Assistance: 5: Stand by assistance Dynamic Standing - Comments: supervision with RW Extremity/Trunk Assessment RUE Assessment RUE Assessment: Within Functional Limits LUE Assessment LUE Assessment: Exceptions to [facility] General Strength Comments: Strength overall 4/5 deficits in ataxia LUE Body System: Neuro Brunstrum levels for arm and hand: Arm;Hand Brunstrum level for arm: Stage V Relative Independence from Synergy Brunstrum level for hand: Stage VI Isolated joint movements   See Function Navigator for Current Functional Status.  Daneen Schick  07/08/2018, 12:42 PM

## 2018-07-08 NOTE — Progress Notes (Signed)
Speech Language Pathology Discharge Summary  Patient Details  Name: Karl [name] MRN: [id number] Date of Birth: May 05, 1956  Today's Date: 07/08/2018 SLP Individual Time: 1130-1155 SLP Individual Time Calculation (min): 25 min   Skilled Therapeutic Interventions: Skilled treatment session focused on completion of family education. Patient's daughter and grandson present and educated on patient's current swallowing function, diet recommendations, appropriate textures and compensatory strategies. Patient's family also educated on patient's current cognitive function and strategies to utilize to maximize recall and carryover of functional information and overall safety with functional and familiar tasks including 24 hour supervision. All verbalized understanding but all questions were answered at this time. Patient left upright in wheelchair and all needs within reach. Continue with current plan of care.   Patient has met 3 of 3 long term goals.  Patient to discharge at overall Supervision;Modified Independent level.   Reasons goals not met: N/A   Clinical Impression/Discharge Summary: Patient has made excellent gains and has met 3 of 3 LTG's this admission. Currently, patient is consuming regular textures with thin liquids without overt s/s of aspiration with Mod I. Patient is also overall supervision-Mod I to complete functional and familiar tasks in regards to complex problem solving and recall. Patient and family education complete and patient will discharge home with 24 hour supervision from family. Patient would benefit from f/u SLP services to maximize cognitive functioning and overall functional independence in order to reduce caregiver burden.   Care Partner:  Caregiver Able to Provide Assistance: Yes  Type of Caregiver Assistance: Physical;Cognitive  Recommendation:  24 hour supervision/assistance;Outpatient SLP  Rationale for SLP Follow Up: Maximize cognitive function and independence    Equipment: N/A   Reasons for discharge: Treatment goals met   Patient/Family Agrees with Progress Made and Goals Achieved: Yes   Function:   Cognition Comprehension Comprehension assist level: Follows basic conversation/direction with extra time/assistive device  Expression   Expression assist level: Expresses basic needs/ideas: With extra time/assistive device  Social Interaction Social Interaction assist level: Interacts appropriately with others - No medications needed.  Problem Solving Problem solving assist level: Solves basic problems with no assist  Memory Memory assist level: Recognizes or recalls 90% of the time/requires cueing < 10% of the time   An Schnabel 07/08/2018, 3:35 PM

## 2018-07-08 NOTE — Progress Notes (Signed)
[facility]     PROGRESS NOTE    Subjective/Complaints: Had a good night. Denies pain. Excited to be going home tomorrow  ROS: Patient denies fever, rash, sore throat, blurred vision, nausea, vomiting, diarrhea, cough, shortness of breath or chest pain, joint or back pain, headache, or mood change.     Objective:  No results found. Recent Labs    07/08/18 0527  WBC 4.8  HGB 12.0*  HCT 38.7*  PLT 218   Recent Labs    07/08/18 0527  NA 140  K 3.7  CL 106  GLUCOSE 126*  BUN 7*  CREATININE 0.96  CALCIUM 9.1   CBG (last 3)  Recent Labs    07/07/18 1211 07/07/18 1626 07/07/18 2155  GLUCAP 131* 199* 91    Wt Readings from Last 3 Encounters:  07/05/18 89.9 kg (198 lb 3.1 oz)  06/14/18 105.2 kg (232 lb)  06/11/18 105.2 kg (232 lb)     Intake/Output Summary (Last 24 hours) at 07/08/2018 0832 Last data filed at 07/08/2018 0750 Gross per 24 hour  Intake 702 ml  Output -  Net 702 ml    Vital Signs: Blood pressure (!) 165/81, pulse 86, temperature 98.2 F (36.8 C), resp. rate 18, height 6\' 2"  (1.88 m), weight 89.9 kg (198 lb 3.1 oz), SpO2 99 %. Physical Exam:  Constitutional: No distress . Vital signs reviewed. HEENT: EOMI, oral membranes moist Neck: supple Cardiovascular: RRR without murmur. No JVD    Respiratory: CTA Bilaterally without wheezes or rales. Normal effort    GI: BS +, non-tender, non-distended  Extremities: no edema Neurological:  Left central 7.  Continued decreased coordination in the left arm and leg. Motor:  5/5 RUE/RLE.  LUE 4-4+/5 prox to distal (improved coordination).  LLE 4+/5 (stable). Psych: normal mood. Normal affect.  Assessment/Plan: 1. Functional and mobility deficits secondary to bilateral posterior circulation infarcts which require 3+ hours per day of interdisciplinary therapy in a comprehensive inpatient rehab setting. Physiatrist is providing close team supervision and 24 hour management  of active medical problems listed below. Physiatrist and rehab team continue to assess barriers to discharge/monitor patient progress toward functional and medical goals.  Function:  Bathing Bathing position Bathing activity did not occur: N/A Position: Shower  Bathing parts Body parts bathed by patient: Right arm, Left arm, Chest, Abdomen, Front perineal area, Right upper leg, Left upper leg, Buttocks, Left lower leg, Right lower leg Body parts bathed by helper: Right lower leg, Left lower leg, Buttocks  Bathing assist Assist Level: Supervision or verbal cues      Upper Body Dressing/Undressing Upper body dressing   What is the patient wearing?: Pull over shirt/dress     Pull over shirt/dress - Perfomed by patient: Thread/unthread left sleeve, Put head through opening, Pull shirt over trunk, Thread/unthread right sleeve          Upper body assist Assist Level: Supervision or verbal cues      Lower Body Dressing/Undressing Lower body dressing Lower body dressing/undressing activity did not occur: N/A What is the patient wearing?: Pants, Underwear, Socks, Shoes Underwear - Performed by patient: Thread/unthread left underwear leg, Pull underwear up/down, Thread/unthread right underwear leg   Pants- Performed by patient: Pull pants up/down, Thread/unthread left pants leg, Thread/unthread right pants leg Pants- Performed by helper: Fasten/unfasten pants Non-skid slipper socks- Performed by patient: Don/doff right sock, Don/doff left sock Non-skid slipper socks- Performed by helper: Don/doff left sock, Don/doff right sock Socks - Performed  by patient: Don/doff right sock, Don/doff left sock   Shoes - Performed by patient: Don/doff right shoe, Don/doff left shoe, Fasten right, Fasten left            Lower body assist Assist for lower body dressing: Touching or steadying assistance (Pt > 75%)      Toileting Toileting   Toileting steps completed by patient: Adjust clothing  prior to toileting, Performs perineal hygiene, Adjust clothing after toileting Toileting steps completed by helper: Adjust clothing prior to toileting, Adjust clothing after toileting Toileting Assistive Devices: Grab bar or rail  Toileting assist Assist level: Touching or steadying assistance (Pt.75%)   Transfers Chair/bed transfer   Chair/bed transfer method: Stand pivot Chair/bed transfer assist level: Touching or steadying assistance (Pt > 75%) Chair/bed transfer assistive device: Walker, Designer, fashion/clothing     Max distance: 41' Assist level: Touching or steadying assistance (Pt > 75%)   Wheelchair   Type: Manual Max wheelchair distance: 150 Assist Level: Touching or steadying assistance (Pt > 75%)  Cognition Comprehension Comprehension assist level: Understands basic 90% of the time/cues < 10% of the time  Expression Expression assist level: Expresses basic needs/ideas: With extra time/assistive device  Social Interaction Social Interaction assist level: Interacts appropriately 90% of the time - Needs monitoring or encouragement for participation or interaction.  Problem Solving Problem solving assist level: Solves basic 90% of the time/requires cueing < 10% of the time  Memory Memory assist level: Recognizes or recalls 90% of the time/requires cueing < 10% of the time   Medical Problem List and Plan:  1. 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 DC tomorrow  -Patient to see Rehab MD/provider in the office for transitional care encounter in 1-2 weeks.  2. DVT Prophylaxis/Anticoagulation: Pharmaceutical: Coumadin       3. Pain Management: tylenol prn  4. Mood: LCSW to follow for evaluation and support.  5. Neuropsych: This patient is capable of making decisions on his own behalf.  6. Skin/Wound Care: routine pressure relief measures.   7. Fluids/Electrolytes/Nutrition:   -I personally reviewed all of the patient's labs today, and lab work is within normal limits. .      8. CAD with ICM: Coumadin with INR goal 2-3. Continue coreg and Crestor.  9.T2DM: Metformin increased to 850mg  bid (PTA 1000 twice a day) CBG (last 3)  Recent Labs    07/07/18 1211 07/07/18 1626 07/07/18 2155  GLUCAP 131* 199* 91   Increased metformin to home dose of 1000mg  bid   -improved control overall--no changes today 10. Hypokalemia:  resolved 11. HTN: Monitor BP bid--on coreg bid. Off Vasotec at this time    Vitals:   07/07/18 2017 07/08/18 0540  BP: 135/74 (!) 165/81  Pulse: 80 86  Resp: 18   Temp: 99.9 F (37.7 C) 98.2 F (36.8 C)  SpO2: 100% 99%   Improved control overall---may fine tune once home 12. TURP/BPH: On Proscar and Flomax.   Controlled 13. Hyponatremia  Sodium 140 8/5  Continue to monitor 14. Acute lower UTI  UA +, urine culture with multiple species  -Macrobid completed  LOS (Days) 20 A FACE TO FACE EVALUATION WAS PERFORMED  Ranelle Oyster, MD 07/08/2018 8:32 AM

## 2018-07-09 ENCOUNTER — Telehealth: Payer: Self-pay | Admitting: Pharmacist

## 2018-07-09 LAB — PROTIME-INR
INR: 2.25
PROTHROMBIN TIME: 24.6 s — AB (ref 11.4–15.2)

## 2018-07-09 LAB — GLUCOSE, CAPILLARY: Glucose-Capillary: 136 mg/dL — ABNORMAL HIGH (ref 70–99)

## 2018-07-09 MED ORDER — POLYETHYLENE GLYCOL 3350 17 G PO PACK: 17.0000 g | PACK | Freq: Two times a day (BID) | ORAL | 0 refills | Status: DC

## 2018-07-09 MED ORDER — ROSUVASTATIN CALCIUM 40 MG PO TABS: 40.0000 mg | ORAL_TABLET | Freq: Every day | ORAL | 0 refills | Status: DC

## 2018-07-09 MED ORDER — WARFARIN SODIUM 5 MG PO TABS: ORAL_TABLET | ORAL | 0 refills | Status: DC

## 2018-07-09 MED ORDER — WARFARIN SODIUM 5 MG PO TABS: 10.0000 mg | ORAL_TABLET | Freq: Once | ORAL | Status: DC

## 2018-07-09 MED ORDER — ACETAMINOPHEN 325 MG PO TABS: 325.0000 mg | ORAL_TABLET | ORAL | Status: AC | PRN

## 2018-07-09 MED ORDER — POTASSIUM CHLORIDE ER 10 MEQ PO TBCR: 10.0000 meq | EXTENDED_RELEASE_TABLET | Freq: Two times a day (BID) | ORAL | 0 refills | Status: DC

## 2018-07-09 MED ORDER — CARVEDILOL 12.5 MG PO TABS: 12.5000 mg | ORAL_TABLET | Freq: Two times a day (BID) | ORAL | 0 refills | Status: DC

## 2018-07-09 NOTE — Discharge Instructions (Signed)
Inpatient Rehab Discharge Instructions  [name] Discharge date and time: 07/09/18   Activities/Precautions/ Functional Status: Activity: no lifting, driving, or strenuous exercise till cleared by MD Diet: cardiac diet and diabetic diet Wound Care: none needed   Functional status:  ___ No restrictions     ___ Walk up steps independently __X_ 24/7 supervision/assistance   ___ Walk up steps with assistance ___ Intermittent supervision/assistance  ___ Bathe/dress independently ___ Walk with walker     __X_ Bathe/dress with assistance ___ Walk Independently    ___ Shower independently ___ Walk with assistance    ___ Shower with assistance __X_ No alcohol     ___ Return to work/school ________  Special Instructions: 1. Take your time with all activity.   COMMUNITY REFERRALS UPON DISCHARGE:    Outpatient: PT     OT                   Agency:  [facility]     Phone:  [phone]                Appointment Date/Time:  07/10/18 @ 8:45 am (please arrive by 8:30)                                                                  07/11/18 @ 9:30 am  Medical Equipment/Items Ordered:  Wheelchair, cushion, walker, 3n1 commode and tub bench                                                      Agency/Supplier:  [facility] @ [phone]   STROKE/TIA DISCHARGE INSTRUCTIONS SMOKING Cigarette smoking nearly doubles your risk of having a stroke & is the single most alterable risk factor  If you smoke or have smoked in the last 12 months, you are advised to quit smoking for your health.  Most of the excess cardiovascular risk related to smoking disappears within a year of stopping.  Ask you doctor about anti-smoking medications  [place] Quit Line: 1-800-QUIT NOW  Free Smoking Cessation Classes [phone]  CHOLESTEROL Know your levels; limit fat & cholesterol in your diet  Lipid Panel     Component Value Date/Time   CHOL 238 (H) 06/15/2018 0356   TRIG 137 06/15/2018 0356   HDL 29  (L) 06/15/2018 0356   CHOLHDL 8.2 06/15/2018 0356   VLDL 27 06/15/2018 0356   LDLCALC 182 (H) 06/15/2018 0356      Many patients benefit from treatment even if their cholesterol is at goal.  Goal: Total Cholesterol (CHOL) less than 160  Goal:  Triglycerides (TRIG) less than 150  Goal:  HDL greater than 40  Goal:  LDL (LDLCALC) less than 100   BLOOD PRESSURE American Stroke Association blood pressure target is less that 120/80 mm/Hg  Your discharge blood pressure is:  BP: 130/74  Monitor your blood pressure  Limit your salt and alcohol intake  Many individuals will require more than one medication for high blood pressure  DIABETES (A1c is a blood sugar average for last 3 months) Goal HGBA1c is under 7% (HBGA1c  is blood sugar average for last 3 months)  Diabetes:     Lab Results  Component Value Date   HGBA1C 6.8 (H) 06/15/2018     Your HGBA1c can be lowered with medications, healthy diet, and exercise.  Check your blood sugar as directed by your physician  Call your physician if you experience unexplained or low blood sugars.  PHYSICAL ACTIVITY/REHABILITATION Goal is 30 minutes at least 4 days per week  Activity: No driving, Therapies: see above Return to work:  N/A  Activity decreases your risk of heart attack and stroke and makes your heart stronger.  It helps control your weight and blood pressure; helps you relax and can improve your mood.  Participate in a regular exercise program.  Talk with your doctor about the best form of exercise for you (dancing, walking, swimming, cycling).  DIET/WEIGHT Goal is to maintain a healthy weight  Your discharge diet is:  Diet Order           Diet regular Room service appropriate? Yes; Fluid consistency: Thin  Diet effective now         liquids Your height is:  Height: 6\' 2"  (188 cm) Your current weight is: Weight: 89.9 kg (198 lb 3.1 oz) Your Body Mass Index (BMI) is:  BMI (Calculated): 25.44  Following the type of diet  specifically designed for you will help prevent another stroke.  You are at  goal weight.  Your goal Body Mass Index (BMI) is 19-24.  Healthy food habits can help reduce 3 risk factors for stroke:  High cholesterol, hypertension, and excess weight.  RESOURCES Stroke/Support Group:  Call [phone]   STROKE EDUCATION PROVIDED/REVIEWED AND GIVEN TO PATIENT Stroke warning signs and symptoms How to activate emergency medical system (call 911). Medications prescribed at discharge. Need for follow-up after discharge. Personal risk factors for stroke. Pneumonia vaccine given:  Flu vaccine given:  My questions have been answered, the writing is legible, and I understand these instructions.  I will adhere to these goals & educational materials that have been provided to me after my discharge from the hospital.      My questions have been answered and I understand these instructions. I will adhere to these goals and the provided educational materials after my discharge from the hospital.  Patient/Caregiver Signature _______________________________ Date __________  Clinician Signature _______________________________________ Date __________  Please bring this form and your medication list with you to all your follow-up doctor's appointments.       Information on my medicine - Coumadin   (Warfarin)  Why was Coumadin prescribed for you? Coumadin was prescribed for you because you have a blood clot or a medical condition that can cause an increased risk of forming blood clots. Blood clots can cause serious health problems by blocking the flow of blood to the heart, lung, or brain. Coumadin can prevent harmful blood clots from forming. As a reminder your indication for Coumadin is:   Select from menu  What test will check on my response to Coumadin? While on Coumadin (warfarin) you will need to have an INR test regularly to ensure that your dose is keeping you in the desired range. The INR  (international normalized ratio) number is calculated from the result of the laboratory test called prothrombin time (PT).  If an INR APPOINTMENT HAS NOT ALREADY BEEN MADE FOR YOU please schedule an appointment to have this lab work done by your health care provider within 7 days. Your INR goal is usually a  number between:  2 to 3 or your provider may give you a more narrow range like 2-2.5.  Ask your health care provider during an office visit what your goal INR is.  What  do you need to  know  About  COUMADIN? Take Coumadin (warfarin) exactly as prescribed by your healthcare provider about the same time each day.  DO NOT stop taking without talking to the doctor who prescribed the medication.  Stopping without other blood clot prevention medication to take the place of Coumadin may increase your risk of developing a new clot or stroke.  Get refills before you run out.  What do you do if you miss a dose? If you miss a dose, take it as soon as you remember on the same day then continue your regularly scheduled regimen the next day.  Do not take two doses of Coumadin at the same time.  Important Safety Information A possible side effect of Coumadin (Warfarin) is an increased risk of bleeding. You should call your healthcare provider right away if you experience any of the following: ? Bleeding from an injury or your nose that does not stop. ? Unusual colored urine (red or dark brown) or unusual colored stools (red or black). ? Unusual bruising for unknown reasons. ? A serious fall or if you hit your head (even if there is no bleeding).  Some foods or medicines interact with Coumadin (warfarin) and might alter your response to warfarin. To help avoid this: ? Eat a balanced diet, maintaining a consistent amount of Vitamin K. ? Notify your provider about major diet changes you plan to make. ? Avoid alcohol or limit your intake to 1 drink for women and 2 drinks for men per day. (1 drink is 5 oz.  wine, 12 oz. beer, or 1.5 oz. liquor.)  Make sure that ANY health care provider who prescribes medication for you knows that you are taking Coumadin (warfarin).  Also make sure the healthcare provider who is monitoring your Coumadin knows when you have started a new medication including herbals and non-prescription products.  Coumadin (Warfarin)  Major Drug Interactions  Increased Warfarin Effect Decreased Warfarin Effect  Alcohol (large quantities) Antibiotics (esp. Septra/Bactrim, Flagyl, Cipro) Amiodarone (Cordarone) Aspirin (ASA) Cimetidine (Tagamet) Megestrol (Megace) NSAIDs (ibuprofen, naproxen, etc.) Piroxicam (Feldene) Propafenone (Rythmol SR) Propranolol (Inderal) Isoniazid (INH) Posaconazole (Noxafil) Barbiturates (Phenobarbital) Carbamazepine (Tegretol) Chlordiazepoxide (Librium) Cholestyramine (Questran) Griseofulvin Oral Contraceptives Rifampin Sucralfate (Carafate) Vitamin K   Coumadin (Warfarin) Major Herbal Interactions  Increased Warfarin Effect Decreased Warfarin Effect  Garlic Ginseng Ginkgo biloba Coenzyme Q10 Green tea St. Johns wort    Coumadin (Warfarin) FOOD Interactions  Eat a consistent number of servings per week of foods HIGH in Vitamin K (1 serving =  cup)  Collards (cooked, or boiled & drained) Kale (cooked, or boiled & drained) Mustard greens (cooked, or boiled & drained) Parsley *serving size only =  cup Spinach (cooked, or boiled & drained) Swiss chard (cooked, or boiled & drained) Turnip greens (cooked, or boiled & drained)  Eat a consistent number of servings per week of foods MEDIUM-HIGH in Vitamin K (1 serving = 1 cup)  Asparagus (cooked, or boiled & drained) Broccoli (cooked, boiled & drained, or raw & chopped) Brussel sprouts (cooked, or boiled & drained) *serving size only =  cup Lettuce, raw (green leaf, endive, romaine) Spinach, raw Turnip greens, raw & chopped   These websites have more information on Coumadin  (warfarin):  http://www.king-russell.com/; https://www.hines.net/;

## 2018-07-09 NOTE — Telephone Encounter (Signed)
Pt going home from rehab on 10mg  alternating with 7.5mg  daily. Pt/INR was therapeutic today. Follow up INR appt scheduled for 8/9.

## 2018-07-09 NOTE — Progress Notes (Signed)
Recreational Therapy Discharge Summary Patient Details  Name: Karl [name] MRN: [id number] Date of Birth: [birth year] Today's Date: 07/09/2018  Comments on progress toward goals: Pt has made good progress during LOS and is ready for discharge home with family to provide 24 hour supervision.  TR sessions focused on stress management/ relaxation training and pt stated appreciation/understanding for education.  Reasons for discharge: discharge from hospital Patient/family agrees with progress made and goals achieved: Yes  Kloee Ballew 07/09/2018, 8:13 AM

## 2018-07-09 NOTE — Progress Notes (Signed)
[facility] PHYSICAL MEDICINE & REHABILITATION     PROGRESS NOTE    Subjective/Complaints: Up in bed. Excited about going home today. No new complaints. Had questions about coumadin dosing  ROS: Patient denies fever, rash, sore throat, blurred vision, nausea, vomiting, diarrhea, cough, shortness of breath or chest pain, joint or back pain, headache, or mood change.       Objective:  No results found. Recent Labs    07/08/18 0527  WBC 4.8  HGB 12.0*  HCT 38.7*  PLT 218   Recent Labs    07/08/18 0527  NA 140  K 3.7  CL 106  GLUCOSE 126*  BUN 7*  CREATININE 0.96  CALCIUM 9.1   CBG (last 3)  Recent Labs    07/08/18 1631 07/08/18 2144 07/09/18 0717  GLUCAP 112* 129* 136*    Wt Readings from Last 3 Encounters:  07/05/18 89.9 kg (198 lb 3.1 oz)  06/14/18 105.2 kg (232 lb)  06/11/18 105.2 kg (232 lb)     Intake/Output Summary (Last 24 hours) at 07/09/2018 0826 Last data filed at 07/08/2018 2200 Gross per 24 hour  Intake 480 ml  Output -  Net 480 ml    Vital Signs: Blood pressure 130/74, pulse 74, temperature 98.9 F (37.2 C), temperature source Oral, resp. rate 18, height 6\' 2"  (1.88 m), weight 89.9 kg (198 lb 3.1 oz), SpO2 98 %. Physical Exam:  Constitutional: No distress . Vital signs reviewed. HEENT: EOMI, oral membranes moist Neck: supple Cardiovascular: RRR without murmur. No JVD    Respiratory: CTA Bilaterally without wheezes or rales. Normal effort    GI: BS +, non-tender, non-distended  Neurological:  Left central 7.  Continued decreased coordination in the left arm and leg. Motor:  5/5 RUE/RLE.  LUE 4-4+/5 prox to distal (improved coordination).  LLE 4+/5 (stable). Psych: normal mood. Normal affect.  Assessment/Plan: 1. Functional and mobility deficits secondary to bilateral posterior circulation infarcts which require 3+ hours per day of interdisciplinary therapy in a comprehensive inpatient rehab setting. Physiatrist is providing close  team supervision and 24 hour management of active medical problems listed below. Physiatrist and rehab team continue to assess barriers to discharge/monitor patient progress toward functional and medical goals.  Function:  Bathing Bathing position Bathing activity did not occur: N/A Position: Shower  Bathing parts Body parts bathed by patient: Right arm, Left arm, Chest, Abdomen, Front perineal area, Right upper leg, Left upper leg, Buttocks, Left lower leg, Right lower leg Body parts bathed by helper: Right lower leg, Left lower leg, Buttocks  Bathing assist Assist Level: Supervision or verbal cues      Upper Body Dressing/Undressing Upper body dressing   What is the patient wearing?: Pull over shirt/dress     Pull over shirt/dress - Perfomed by patient: Thread/unthread right sleeve, Thread/unthread left sleeve, Pull shirt over trunk, Put head through opening          Upper body assist Assist Level: Set up      Lower Body Dressing/Undressing Lower body dressing Lower body dressing/undressing activity did not occur: N/A What is the patient wearing?: Underwear, Pants, Socks, Shoes Underwear - Performed by patient: Thread/unthread right underwear leg, Thread/unthread left underwear leg, Pull underwear up/down   Pants- Performed by patient: Thread/unthread right pants leg, Thread/unthread left pants leg, Pull pants up/down Pants- Performed by helper: Fasten/unfasten pants Non-skid slipper socks- Performed by patient: Don/doff right sock, Don/doff left sock Non-skid slipper socks- Performed by helper: Don/doff left sock, Don/doff right sock  Socks - Performed by patient: Don/doff right sock, Don/doff left sock   Shoes - Performed by patient: Don/doff left shoe, Don/doff right shoe, Fasten left, Fasten right            Lower body assist Assist for lower body dressing: Supervision or verbal cues      Toileting Toileting   Toileting steps completed by patient: Adjust clothing  prior to toileting, Performs perineal hygiene, Adjust clothing after toileting Toileting steps completed by helper: Adjust clothing prior to toileting, Adjust clothing after toileting Toileting Assistive Devices: Grab bar or rail  Toileting assist Assist level: Supervision or verbal cues   Transfers Chair/bed transfer   Chair/bed transfer method: Stand pivot Chair/bed transfer assist level: Supervision or verbal cues Chair/bed transfer assistive device: Environmental consultant, Designer, fashion/clothing     Max distance: 171 ft Assist level: Supervision or verbal cues   Wheelchair   Type: Manual Max wheelchair distance: 150 Assist Level: Supervision or verbal cues  Cognition Comprehension Comprehension assist level: Follows basic conversation/direction with no assist  Expression Expression assist level: Expresses basic needs/ideas: With extra time/assistive device  Social Interaction Social Interaction assist level: Interacts appropriately with others - No medications needed.  Problem Solving Problem solving assist level: Solves basic problems with no assist  Memory Memory assist level: Recognizes or recalls 90% of the time/requires cueing < 10% of the time   Medical Problem List and Plan:  1. 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 DC today  -Patient to see Rehab MD/provider in the office for transitional care encounter in 1-2 weeks.  2. DVT Prophylaxis/Anticoagulation: Pharmaceutical: Coumadin  INR therapeutic today     3. Pain Management: tylenol prn  4. Mood: LCSW to follow for evaluation and support.  5. Neuropsych: This patient is capable of making decisions on his own behalf.  6. Skin/Wound Care: routine pressure relief measures.  7. Fluids/Electrolytes/Nutrition:    .      8. CAD with ICM: Coumadin with INR goal 2-3. Continue coreg and Crestor.   9.T2DM: Metformin increased to 850mg  bid (PTA 1000 twice a day) CBG (last 3)  Recent Labs    07/08/18 1631 07/08/18 2144 07/09/18 0717  GLUCAP 112* 129* 136*   Increased metformin to home dose of 1000mg  bid   -improved control overall--reviewed diet today again 10. Hypokalemia:  resolved 11. HTN: Monitor BP bid--on coreg bid. Off Vasotec at this time    Vitals:   07/09/18 0446 07/09/18 0802  BP: 125/76 130/74  Pulse: 78 74  Resp: 18   Temp: 98.9 F (37.2 C)   SpO2: 98%    Improved control   12. TURP/BPH: On Proscar and Flomax.   Controlled 13. Hyponatremia  Sodium 140 8/5  Continue to monitor 14. Acute lower UTI  UA +, urine culture with multiple species  -Macrobid completed  LOS (Days) 21 A FACE TO FACE EVALUATION WAS PERFORMED  Ranelle Oyster, MD 07/09/2018 8:26 AM

## 2018-07-09 NOTE — Progress Notes (Signed)
Social Work  Discharge Note  The overall goal for the admission was met for:   Discharge location: Yes - home with daughter who, along with other family, will be providing 24/7 assistance  Length of Stay: Yes - 20 days  Discharge activity level: Yes - supervision overall  Home/community participation: Yes  Services provided included: MD, RD, PT, OT, SLP, RN, TR, Pharmacy and [place]: Private Insurance: [employer]  Follow-up services arranged: Outpatient: PT, OT via [facility], DME: 18x18 lightweight w/c, cushion, rolling walker, 3n1 commode and tub bench via [place] and Patient/Family has no preference for HH/DME agencies  Comments (or additional information):  Patient/Family verbalized understanding of follow-up arrangements: Yes  Individual responsible for coordination of the follow-up plan: pt  Confirmed correct DME delivered: Jeanette Moffatt 07/09/2018    Alisia Vanengen

## 2018-07-09 NOTE — Progress Notes (Signed)
ANTICOAGULATION CONSULT NOTE - Follow Up Consult  Pharmacy Consult for Coumadin Indication: stroke  Patient Measurements: Height: 6\' 2"  (188 cm) Weight: 198 lb 3.1 oz (89.9 kg) IBW/kg (Calculated) : 82.2  Vital Signs: Temp: 98.9 F (37.2 C) (08/06 0446) Temp Source: Oral (08/06 0446) BP: 130/74 (08/06 0802) Pulse Rate: 74 (08/06 0802)  Labs: Recent Labs    07/07/18 0603 07/08/18 0527 07/09/18 0611  HGB  --  12.0*  --   HCT  --  38.7*  --   PLT  --  218  --   LABPROT 23.6* 25.9* 24.6*  INR 2.13 2.39 2.25  CREATININE  --  0.96  --     Estimated Creatinine Clearance: 91.6 mL/min (by C-G formula based on SCr of 0.96 mg/dL).  Assessment:  62 yr old male on Coumadin as prior to admission for stroke.   INR therapeutic x3 days , after Coumadin 10 mg on 8/3 and 7.5 mg on 8/4 and 8/5.  Has sometimes been sensitive to multiple consecutive 10 mg doses this admission.  No Coumadin given on 8/1 when INR 3.01 and INR dropped to 1.95 on 8/3.   Home Coumadin regimen: 10 mg daily;   INR subtherapeutic on admit 7/12 (1.16).  Coumadin had been held for prostate surgery on 7/3, unsure when resumed.  Goal of Therapy:  INR 2-3 Monitor platelets by anticoagulation protocol: Yes   Plan:   Coumadin 10 mg again today x1 dose.  Continue daily PT/INR.  May be able to take 10 mg alternating with 7.5 mg every other day.  Suggest prescribing 5 mg tablets to allow flexibility with doses.  Legrand Pitts, RPh  Clinical Pharmacist phone: [phone] See AMION for all [facility] phone numbers 07/09/2018,8:44 AM

## 2018-07-09 NOTE — Discharge Summary (Signed)
Physician Discharge Summary  Patient ID: Karl [name] MRN: [id number] DOB/AGE: 64/06/1956 64 y.o.  Admit date: 06/18/2018 Discharge date: 07/09/2018  Discharge Diagnoses:  Principal Problem:   Acute ischemic right PCA stroke [facility]) Active Problems:   Essential hypertension   Cardiomyopathy, ischemic   DM (diabetes mellitus), type 2, uncontrolled, periph vascular complic (HCC)   Ischemic cardiomyopathy   Acute lower UTI   Chronic anticoagulation   Discharged Condition: stable   Significant Diagnostic Studies: N/A   Labs:  Basic Metabolic Panel:  BMP Latest Ref Rng & Units 07/08/2018 07/02/2018 06/25/2018  Glucose 70 - 99 mg/dL 143(O) 887(N) 797(K)  BUN 8 - 23 mg/dL 7(L) 7(L) 12  Creatinine 0.61 - 1.24 mg/dL 8.20 6.01 5.61  BUN/Creat Ratio 10 - 24 - - -  Sodium 135 - 145 mmol/L 140 137 135  Potassium 3.5 - 5.1 mmol/L 3.7 3.8 4.3  Chloride 98 - 111 mmol/L 106 105 99  CO2 22 - 32 mmol/L 26 26 24   Calcium 8.9 - 10.3 mg/dL 9.1 9.1 9.3    CBC: CBC Latest Ref Rng & Units 07/08/2018 07/02/2018 06/25/2018  WBC 4.0 - 10.5 K/uL 4.8 5.3 8.6  Hemoglobin 13.0 - 17.0 g/dL 12.0(L) 12.0(L) 13.8  Hematocrit 39.0 - 52.0 % 38.7(L) 38.9(L) 43.5  Platelets 150 - 400 K/uL 218 269 433(H)    CBG: Recent Labs  Lab 07/08/18 0641 07/08/18 1129 07/08/18 1631 07/08/18 2144 07/09/18 0717  GLUCAP 124* 155* 112* 129* 136*    Brief HPI:   Karl [name] is a 61 year old male with history of  T2DM, CAD with ICM, CVA - 12/2017, TURP 06/05/2018 for BPH who was admitted on 67/12/19 with dizziness, right lower extremity numbness, double vision and balance deficits.  Patient had been off Coumadin due to surgical procedure and INR was 1.16 at admission.  UDS was negative.  CTA head/neck showed hypoattenuation in right thalamus, right medial occipital and right superior cerebellum.  MRI revealed acute infarcts in right PCA and right ACA territory with punctate infarct in left PCA white matter felt to be embolic.  2D  echo showed EF of 20% with diffuse hypokinesis and loosely organized thrombotic material in the LV apex.  He was started on IV heparin bridge to INR until therapeutic.  Dr. Raynald Kemp felt that stroke was embolic due to cardiac thrombus.  MBS showed functional swallow and he was placed on dysphagia 3 diet with thin liquids.  He continued to be limited by balance deficits, left-sided weakness with left hemianopsia and left inattention.  CIR was recommended due to functional deficits   Hospital Course: Karl [name] was admitted to rehab 06/18/2018 for inpatient therapies to consist of PT, ST and OT at least three hours five days a week. Past admission physiatrist, therapy team and rehab RN have worked together to provide customized collaborative inpatient rehab.  Blood pressures have been monitored on twice daily basis and have been reasonably controlled on Coreg twice daily.  Diabetes has been monitored with a CHF CBG checks.  P.o. intake has been good and metformin has been titrated up to thousand milligrams twice daily with good control.  Foley was DC'd past admission and voiding was monitored with PVR checks and he has been voiding without difficulty.  UA done and showed evidence of acute UTI and he was treated with Macrobid x7 days.     Constipation is resolved with augmentation of bowel program follow-up labs done past admission showed evidence of hypokalemia therefore he was started  on supplement.  Follow-up labs his renal status is stable and hyperkalemia is resolved.  He was maintained on Coumadin throughout his stay with pharmacy managing and adjusting his dose.  INR is therapeutic at discharge and he is to continue on 10 mg alternating with 7.5 mg daily.  Repeat  PT/INR to be checked on 8/9 at [facility] coumadin clinic after discharge.  Has resolved and balance is improving.  He has progressed to supervision level and will continue to receive follow-up outpatient PT OT after discharge.    Rehab course: During  patient's stay in rehab weekly team conferences were held to monitor patient's progress, set goals and discuss barriers to discharge. At admission, patient required mod assist with mobility and basic self-care tasks.  He demonstrated mild cognitive impairments affecting problem-solving, recall, left inattention as well as hoarse vocal quality. Swallow function appeared intact but he was downgraded to dysphagia 2 due to reports of dysphagia.   He  has had improvement in activity tolerance, balance, postural control as well as ability to compensate for deficits.  He is able to complete ADL tasks with supervision. He is able to perform transfers with supervision and verbal cues.  He is ambulating 75 feet with rolling walker and supervision.  He is tolerating regular textures without signs or symptoms of aspiration.  He is able to complete function and familiar task, complex problem-solving and able to recall at supervision to modified independent level.  Family education was done and risk regarding all aspects of care as well as safety.   Disposition:  01-Home or Self Care  Diet: Heart Healthy/Diabetic.   Special Instructions: 1. Needs supervision with activity. 2. NO driving till cleared by MD.    Discharge Instructions    Ambulatory referral to Physical Medicine Rehab   Complete by:  As directed    1-2 weeks transitional care appt     Allergies as of 07/09/2018      Reactions   Lipitor [atorvastatin] Nausea And Vomiting      Medication List    STOP taking these medications   dapagliflozin propanediol 5 MG Tabs tablet Commonly known as:  FARXIGA   enalapril 5 MG tablet Commonly known as:  VASOTEC   HYDROcodone-acetaminophen 5-325 MG tablet Commonly known as:  NORCO/VICODIN   ondansetron 4 MG disintegrating tablet Commonly known as:  ZOFRAN ODT     TAKE these medications   acetaminophen 325 MG tablet Commonly known as:  TYLENOL Take 1-2 tablets (325-650 mg total) by mouth every 4  (four) hours as needed for mild pain.   carvedilol 12.5 MG tablet Commonly known as:  COREG Take 1 tablet (12.5 mg total) by mouth 2 (two) times daily with a meal. What changed:  See the new instructions.   finasteride 5 MG tablet Commonly known as:  PROSCAR Take 5 mg by mouth daily.   metFORMIN 1000 MG tablet Commonly known as:  GLUCOPHAGE TAKE 1 TABLET BY MOUTH TWICE DAILY WITH A MEAL What changed:  See the new instructions.   polyethylene glycol packet Commonly known as:  MIRALAX / GLYCOLAX Take 17 g by mouth 2 (two) times daily.   potassium chloride 10 MEQ tablet Commonly known as:  K-DUR Take 1 tablet (10 mEq total) by mouth 2 (two) times daily.   rosuvastatin 40 MG tablet Commonly known as:  CRESTOR Take 1 tablet (40 mg total) by mouth daily at 6 PM.   tamsulosin 0.4 MG Caps capsule Commonly known as:  FLOMAX Take 1  capsule (0.4 mg total) by mouth daily after supper.   warfarin 5 MG tablet Commonly known as:  COUMADIN Take as directed. If you are unsure how to take this medication, talk to your nurse or doctor. Original instructions:  Take two pill today with supper. Alternate with one and one half pill every other day. What changed:    medication strength  how much to take  how to take this  when to take this  additional instructions      Follow-up Information    Pincus Sanes, MD Follow up on 07/15/2018.   Specialty:  Internal Medicine Why:  @ 10:00 am (hospital follow up appointment) Contact information: [address] [place] [address] [phone]        Marinus Maw, MD Follow up on 07/12/2018.   Specialty:  Cardiology Why:  go by the office for protime draw at 3 pm.  Contact information: [address] [place] [address] [phone]        Ranelle Oyster, MD Follow up.   Specialty:  Physical Medicine and Rehabilitation Why:  Office will call you with follow up appointment Contact information: [address] [place] [address] [phone]        Crista Elliot, MD. Call.   Specialty:  Urology Why:  for follow up appointment Contact information: [address] [place] [address] [phone]        [facility]. Call.   Why:  Make follow up appointment with MD office tomorrow while at outpatient rehab.  Contact information: [address]     [address] [place] [address] [phone]          Signed: Jacquelynn Cree 07/09/2018, 6:02 PM

## 2018-07-10 ENCOUNTER — Ambulatory Visit: Payer: Managed Care, Other (non HMO) | Admitting: Occupational Therapy

## 2018-07-10 ENCOUNTER — Ambulatory Visit: Payer: Managed Care, Other (non HMO) | Attending: Physical Medicine & Rehabilitation | Admitting: Occupational Therapy

## 2018-07-10 DIAGNOSIS — R2681 Unsteadiness on feet: Secondary | ICD-10-CM | POA: Insufficient documentation

## 2018-07-10 DIAGNOSIS — M6281 Muscle weakness (generalized): Secondary | ICD-10-CM | POA: Diagnosis present

## 2018-07-10 DIAGNOSIS — I69354 Hemiplegia and hemiparesis following cerebral infarction affecting left non-dominant side: Secondary | ICD-10-CM | POA: Insufficient documentation

## 2018-07-10 DIAGNOSIS — R41842 Visuospatial deficit: Secondary | ICD-10-CM | POA: Insufficient documentation

## 2018-07-10 DIAGNOSIS — I69315 Cognitive social or emotional deficit following cerebral infarction: Secondary | ICD-10-CM | POA: Insufficient documentation

## 2018-07-10 DIAGNOSIS — R278 Other lack of coordination: Secondary | ICD-10-CM | POA: Diagnosis present

## 2018-07-10 DIAGNOSIS — R2689 Other abnormalities of gait and mobility: Secondary | ICD-10-CM

## 2018-07-10 DIAGNOSIS — R293 Abnormal posture: Secondary | ICD-10-CM | POA: Diagnosis present

## 2018-07-10 NOTE — Therapy (Signed)
[facility] [address] [place], [place], [address] Phone: [phone]   Fax:  [phone]  Occupational Therapy Evaluation  Patient Details  Name: Karl [name] MRN: [id number] Date of Birth: [birth year] Referring Provider: Dr. Riley Kill   Encounter Date: 07/10/2018  OT End of Session - 07/10/18 1822    Visit Number  1    Number of Visits  17    Date for OT Re-Evaluation  09/08/18    Authorization Type  [employer]    Authorization - Visit Number  1    Authorization - Number of Visits  30    OT Start Time  1116    OT Stop Time  1155    OT Time Calculation (min)  39 min    Activity Tolerance  Patient tolerated treatment well    Behavior During Therapy  [facility] for tasks assessed/performed       Past Medical History:  Diagnosis Date  . Acute CVA (cerebrovascular accident) (HCC) 06/14/2018  . Coronary artery disease    blockage   Stent Dr. Ladona Ridgel 13 years 1998  . Dyspnea   . Hypertension   . Stroke [facility]) 2019   denies residual on 06/14/2018  . Type II diabetes mellitus (HCC) 07/2014 dx    Past Surgical History:  Procedure Laterality Date  . CORONARY ANGIOPLASTY WITH STENT PLACEMENT  1998  . FRACTURE SURGERY    . GREEN LIGHT LASER TURP (TRANSURETHRAL RESECTION OF PROSTATE N/A 06/05/2018   Procedure: GREEN LIGHT LASER TURP , TRANSURETHRAL RESECTION OF PROSTATE;  Surgeon: Crista Elliot, MD;  Location: [facility] ORS;  Service: Urology;  Laterality: N/A;  . TIBIA FRACTURE SURGERY Left 1980s    There were no vitals filed for this visit.  Subjective Assessment - 07/10/18 1817    Subjective   Pt s/p CVA 06/14/18    Pertinent History  CVA 06/14/18, T2DM, CAD with ICM on chronic coumadin, prior CVA 12/2017, TURP 06/05/18 for BPH    Patient Stated Goals  regain independence    Currently in Pain?  No/denies        [facility] OT Assessment - 07/10/18 1129      Assessment   Medical Diagnosis  CVA    Referring Provider  Dr. Riley Kill    Onset Date/Surgical Date  06/14/18    Hand Dominance  Right    Prior Therapy  CIR      Precautions   Precautions  Fall    Precaution Comments  L inattention      Balance Screen   Has the patient fallen in the past 6 months  No    Has the patient had a decrease in activity level because of a fear of falling?   No    Is the patient reluctant to leave their home because of a fear of falling?   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      ADL   Eating/Feeding  Independent    Grooming  Independent    Upper Body Bathing  Supervision/safety    Lower Body Bathing  Supervision/safety    Upper Body  Dressing  Supervision/safety    Lower Body Dressing  Min guard    Toilet Transfer  Min guard with walker    Tub/Shower Transfer  Supervision/safety -minguard     IADL   Shopping  Needs to be accompanied on any shopping trip    Light Housekeeping  Needs help with all home maintenance tasks    Meal Prep  Needs to have meals prepared and served    Medication Management  Takes responsibility if medication is prepared in advance in seperate dosage    Financial Management  Requires assistance      Mobility   Mobility Status  -- supervision-minguard with walker      Written Expression   Dominant Hand  Right      Vision - History   Baseline Vision  Wears glasses only for reading      Vision Assessment   Vision Assessment  Vision impaired  _ to be further tested in functional context per hospital report homonymous hemianopsia    Comment  Pt appears to demonstrate L inattention      Cognition   Overall Cognitive Status  Impaired/Different from baseline cognition to be further assessed within a functional context     Memory  Impaired    Memory Impairment  Decreased short term memory      Sensation   Light Touch  Impaired by gross assessment LUE    Hot/Cold  Impaired by gross assessment LUE    Proprioception  Impaired by gross assessment LUE      Coordination   Gross Motor Movements are Fluid and Coordinated  No    Fine Motor Movements are Fluid and Coordinated  No    9 Hole Peg Test  Right;Left    Right 9 Hole Peg Test  31.34 secs    Left 9 Hole Peg Test  85.44 secs      ROM / Strength   AROM / PROM / Strength  AROM;Strength      AROM   Overall AROM   Deficits    Overall AROM Comments  LUE shoulder flexion limited to grossly 120, due to weaknessremaining A/ROM grossly WFLS however pt demonstrates decreased gross motor coordination, decreased smoothness of movements due to impaired sensation                       OT Short Term Goals - 07/10/18 1842      OT SHORT TERM GOAL #1   Title  I with inial HEP    Time  4    Period  Weeks    Status  New    Target Date  07/14/18      OT SHORT TERM GOAL #2   Title  Pt will perform all basic ADLs modified independently.    Time  4    Period  Weeks    Status  New      OT SHORT TERM GOAL #3   Title  Pt will demonstrate improved fine motor coordination for ADLS as evidenced by decreasing 9 hole peg test score to 75 secs or less.    Time  4    Period  Weeks    Status  New      OT SHORT TERM GOAL #4   Title  Pt will perfrom snack prep/ basic home management with supervision.    Time  4    Period  Weeks    Status  New      OT  SHORT TERM GOAL #5   Title  Pt will verbalize understanding of compensatory strategiues for short term memory deficits.    Time  4    Period  Weeks    Status  New      Additional Short Term Goals   Additional Short Term Goals  Yes      OT SHORT TERM GOAL #6   Title  Further assess vision and cognition and set goals prn    Time  4    Period  Weeks    Status  New        OT Long Term Goals -  07/10/18 1850      OT LONG TERM GOAL #1   Title  I with updated HEP.    Time  8    Period  Weeks    Status  New    Target Date  09/08/18      OT LONG TERM GOAL #2   Title  Pt will perform basic home managmeent and cooking at a modified independent level demonstrating good safety awareness.    Time  8    Period  Weeks    Status  New      OT LONG TERM GOAL #3   Title  Pt will navigate a busy environment and locate items with 90% or better accuracy.    Time  8    Period  Weeks    Status  New      OT LONG TERM GOAL #4   Title  Pt will demonstrate improved fine motor coordination for ADLs as evidenced by decreasing 9 hole peg test score to 60 secs or less.    Time  8    Period  Weeks    Status  New      OT LONG TERM GOAL #5   Title  Pt will demonstrate improved LUE strength as evidenced by retrieving a 3 lbs item from overhead shelf at 130 without drops.    Time  8    Period  Weeks    Status  New            Plan - 07/10/18 1824    Clinical Impression Statement  Panfilo Ketchum is a 62 year old male with who was admitted on 06/14/18 with dizziness, RLE numbness, double vision and balance deficits.  Patient had been off coumadin due to surgical procedure and INR 1.16 at admission. UDS negative. CTA head/neck showed hypoattenuation in right thalamus, right medial occipital and right superior cerebellum. MRI done revealing acute infarcts in R-PCA and R-SCA territories Pt receiived therapies on CIR and he was d/c to his dtr's home on 07/09/18. Pt presents with the following deficits: decreased strength, decreased coordination, sensory impairment, decreased balance, visual perceptual deficits, cognitive deficits, decreased balance. Pt can bennefit from skilled occupational therapy to maximize safety and independence with ADLs/IADLS.    Occupational Profile and client history currently impacting functional performance   Pt was completely I and working full time prior to CVA, currently he is  unable to work. CVA 06/14/18,Type II DM, CAD with ICM on chronic coumadin, prior CVA 12/2017, TURP 06/05/18 for BPH     Occupational performance deficits (Please refer to evaluation for details):  ADL's;IADL's;Work;Leisure;Social Participation    Rehab Potential  Good    Current Impairments/barriers affecting progress:  cognitive and visual perceptual deficits, decreased balance    OT Frequency  Other (comment) 16 visits    OT Duration  12 weeks  OT Treatment/Interventions  Self-care/ADL training;Paraffin;Therapeutic exercise;DME and/or AE instruction;Functional Mobility Training;Cognitive remediation/compensation;Balance training;Manual Therapy;Neuromuscular education;Gait Training;Fluidtherapy;Ultrasound;Aquatic Therapy;Moist Heat;Contrast Bath;Energy conservation;Passive range of motion;Therapeutic activities;Patient/family education    Plan  test bilateral grip strength, initate HEP for LUE functional use/ coordination/ putty, further assess visual perceptual skills    Clinical Decision Making  Several treatment options, min-mod task modification necessary    Consulted and Agree with Plan of Care  Patient;Family member/caregiver    Family Member Consulted  dtr       Patient will benefit from skilled therapeutic intervention in order to improve the following deficits and impairments:  Abnormal gait, Decreased balance, Decreased endurance, Decreased mobility, Impaired vision/preception, Impaired perceived functional ability, Decreased range of motion, Decreased knowledge of precautions, Decreased cognition, Decreased activity tolerance, Decreased coordination, Decreased knowledge of use of DME, Decreased safety awareness, Decreased strength, Impaired UE functional use  Visit Diagnosis: Muscle weakness (generalized)  Other abnormalities of gait and mobility  Cognitive social or emotional deficit following cerebral infarction  Visuospatial deficit  Other lack of coordination    Problem  List Patient Active Problem List   Diagnosis Date Noted  . Chronic anticoagulation   . Acute lower UTI   . Acute ischemic right PCA stroke (HCC) 06/18/2018  . Ischemic cardiomyopathy   . CVA (cerebral vascular accident) (HCC) 06/14/2018  . Urinary retention 06/05/2018  . Encounter for therapeutic drug monitoring 02/08/2018  . History of CVA (cerebrovascular accident) 12/20/2017  . Coronary artery disease 12/19/2017  . Mild renal insufficiency 12/19/2017  . TIA (transient ischemic attack) 12/19/2017  . Hip flexor tendinitis 05/22/2017  . Abscess of back 05/22/2017  . Pain of both hip joints 04/25/2017  . Dyslipidemia 09/01/2015  . BPH without urinary obstruction 09/01/2015  . DM (diabetes mellitus), type 2, uncontrolled, periph vascular complic (HCC) 07/10/2014  . Essential hypertension 04/20/2009  . Cardiomyopathy, ischemic 04/20/2009  . LEFT BUNDLE BRANCH BLOCK 04/20/2009  . Chronic systolic heart failure (HCC) 04/20/2009    Jayshaun Phillips 07/10/2018, 6:55 PM Keene Breath, OTR/L [phone] Phone: [phone] 6:57 PM 07/10/18 [facility] [address] [place], [place], [address] Phone: [phone]   Fax:  [phone]  Name: TAYLEN OSORTO MRN: [id number] Date of Birth: [birth year]

## 2018-07-11 ENCOUNTER — Ambulatory Visit: Payer: Managed Care, Other (non HMO) | Admitting: Rehabilitation

## 2018-07-11 ENCOUNTER — Telehealth: Payer: Self-pay | Admitting: *Deleted

## 2018-07-11 NOTE — Telephone Encounter (Signed)
Transitional Care call-  I spoke with his daughter [name]    1. Are you/is patient experiencing any problems since coming home? Are there any questions regarding any aspect of care? NO 2. Are there any questions regarding medications administration/dosing? Are meds being taken as prescribed? Patient should review meds with caller to confirm  NO QUESTIONS, HE HAS ALL MEDICATION AND KNOWS TO TAKE HIS COUMADIN AT AROUND 6 PM AND TO GET [email or web address]  CLINIC (APPT8/9/19 3 PM) 3. Have there been any falls? NO 4. Has Home Health been to the house and/or have they contacted you? If not, have you tried to contact them? Can we help you contact them? HE IS GOING TO OUTPT NEURO REHAB 5. Are bowels and bladder emptying properly? Are there any unexpected incontinence issues? If applicable, is patient following bowel/bladder programs? NO PROBLEMS 6. Any fevers, problems with breathing, unexpected pain? NO 7. Are there any skin problems or new areas of breakdown? NO 8. Has the patient/family member arranged specialty MD follow up (ie cardiology/neurology/renal/surgical/etc)?  Can we help arrange? HAS APPT WITH PCP 07/15/18 AND DR [id number] 07/22/18 9. Does the patient need any other services or support that we can help arrange? NO 10. Are caregivers following through as expected in assisting the patient? YES 11. Has the patient quit smoking, drinking alcohol, or using drugs as recommended? N/A, Sophronia Simas MD CLEARED BY MD  Appointment Monday 07/22/18 @ 11:40  Arrive by 11:20 to see Dr. Riley Kill Address reviewed [address]

## 2018-07-12 ENCOUNTER — Telehealth: Payer: Self-pay | Admitting: Internal Medicine

## 2018-07-12 ENCOUNTER — Ambulatory Visit: Payer: Managed Care, Other (non HMO) | Admitting: Pharmacist

## 2018-07-12 ENCOUNTER — Ambulatory Visit: Payer: Managed Care, Other (non HMO) | Admitting: Occupational Therapy

## 2018-07-12 DIAGNOSIS — Z5181 Encounter for therapeutic drug level monitoring: Secondary | ICD-10-CM | POA: Diagnosis not present

## 2018-07-12 DIAGNOSIS — G459 Transient cerebral ischemic attack, unspecified: Secondary | ICD-10-CM

## 2018-07-12 DIAGNOSIS — R278 Other lack of coordination: Secondary | ICD-10-CM

## 2018-07-12 DIAGNOSIS — M6281 Muscle weakness (generalized): Secondary | ICD-10-CM

## 2018-07-12 DIAGNOSIS — Z8673 Personal history of transient ischemic attack (TIA), and cerebral infarction without residual deficits: Secondary | ICD-10-CM | POA: Diagnosis not present

## 2018-07-12 DIAGNOSIS — R41842 Visuospatial deficit: Secondary | ICD-10-CM

## 2018-07-12 LAB — POCT INR: INR: 4 — AB (ref 2.0–3.0)

## 2018-07-12 NOTE — Patient Instructions (Signed)
Description   Today take only 1/2 tablet then start taking 1.5 tablets daily,except 2 tablets on Mondays, Wednesdays, and Fridays. Recheck INR on    Call the Coumadin clinic with any questions #[phone].

## 2018-07-12 NOTE — Therapy (Signed)
[facility] [address] [place], [place], [address] Phone: [phone]   Fax:  [phone]  Occupational Therapy Treatment  Patient Details  Name: Karl [name] MRN: [id number] Date of Birth: 05/24/1956 Referring Provider: Dr. Riley Kill   Encounter Date: 07/12/2018  OT End of Session - 07/12/18 1600    Visit Number  2    Number of Visits  17    Date for OT Re-Evaluation  09/08/18    Authorization Type  [employer]    Authorization - Visit Number  2    Authorization - Number of Visits  30    OT Start Time  1320    OT Stop Time  1400    OT Time Calculation (min)  40 min    Activity Tolerance  Patient tolerated treatment well    Behavior During Therapy  [facility] for tasks assessed/performed       Past Medical History:  Diagnosis Date  . Acute CVA (cerebrovascular accident) (HCC) 06/14/2018  . Coronary artery disease    blockage   Stent Dr. Ladona Ridgel 15 years 1998  . Dyspnea   . Hypertension   . Stroke [facility]) 2019   denies residual on 06/14/2018  . Type II diabetes mellitus (HCC) 07/2014 dx    Past Surgical History:  Procedure Laterality Date  . CORONARY ANGIOPLASTY WITH STENT PLACEMENT  1998  . FRACTURE SURGERY    . GREEN LIGHT LASER TURP (TRANSURETHRAL RESECTION OF PROSTATE N/A 06/05/2018   Procedure: GREEN LIGHT LASER TURP , TRANSURETHRAL RESECTION OF PROSTATE;  Surgeon: Crista Elliot, MD;  Location: [facility];  Service: Urology;  Laterality: N/A;  . TIBIA FRACTURE SURGERY Left 1980s    There were no vitals filed for this visit.  Subjective Assessment - 07/12/18 1558    Pertinent History  CVA 06/14/18, T2DM, CAD with ICM on chronic coumadin, prior CVA 12/2017, TURP 06/05/18 for BPH    Patient Stated Goals  regain independence          Treatment: HEP issued see pt instructions.                  OT Education - 07/12/18 1559    Education Details  Pt and dtr were educated regarding ball exercises in  supine, coordination HEP and red putty HEP, pt returned demonstration with min v.c  and faciliation.    Person(s) Educated  Patient;Child(ren)    Methods  Explanation;Demonstration;Verbal cues;Handout    Comprehension  Verbalized understanding;Returned demonstration;Verbal cues required       OT Short Term Goals - 07/10/18 1842      OT SHORT TERM GOAL #1   Title  I with inial HEP    Time  4    Period  Weeks    Status  New    Target Date  07/14/18      OT SHORT TERM GOAL #2   Title  Pt will perform all basic ADLs modified independently.    Time  4    Period  Weeks    Status  New      OT SHORT TERM GOAL #3   Title  Pt will demonstrate improved fine motor coordination for ADLS as evidenced by decreasing 9 hole peg test score to 75 secs or less.    Time  4    Period  Weeks    Status  New      OT SHORT TERM GOAL #4   Title  Pt will perfrom  snack prep/ basic home management with supervision.    Time  4    Period  Weeks    Status  New      OT SHORT TERM GOAL #5   Title  Pt will verbalize understanding of compensatory strategiues for short term memory deficits.    Time  4    Period  Weeks    Status  New      Additional Short Term Goals   Additional Short Term Goals  Yes      OT SHORT TERM GOAL #6   Title  Further assess vision and cognition and set goals prn    Time  4    Period  Weeks    Status  New        OT Long Term Goals - 07/10/18 1850      OT LONG TERM GOAL #1   Title  I with updated HEP.    Time  8    Period  Weeks    Status  New    Target Date  09/08/18      OT LONG TERM GOAL #2   Title  Pt will perform basic home managmeent and cooking at a modified independent level demonstrating good safety awareness.    Time  8    Period  Weeks    Status  New      OT LONG TERM GOAL #3   Title  Pt will navigate a busy environment and locate items with 90% or better accuracy.    Time  8    Period  Weeks    Status  New      OT LONG TERM GOAL #4   Title  Pt  will demonstrate improved fine motor coordination for ADLs as evidenced by decreasing 9 hole peg test score to 60 secs or less.    Time  8    Period  Weeks    Status  New      OT LONG TERM GOAL #5   Title  Pt will demonstrate improved LUE strength as evidenced by retrieving a 3 lbs item from overhead shelf at 130 without drops.    Time  8    Period  Weeks    Status  New            Plan - 07/12/18 1607    Clinical Impression Statement  Pt is progressing towards goals for LUE functional use. Pt/ dtr verbalize understanding of HEP.    Occupational Profile and client history currently impacting functional performance   Pt was completely I and working full time prior to CVA, currently he is unable to work. CVA 06/14/18,Type II DM, CAD with ICM on chronic coumadin, prior CVA 12/2017, TURP 06/05/18 for BPH     Rehab Potential  Good    Current Impairments/barriers affecting progress:  cognitive and visual perceptual deficits, decreased balance    OT Frequency  --   16 visits   OT Duration  12 weeks    OT Treatment/Interventions  Self-care/ADL training;Paraffin;Therapeutic exercise;DME and/or AE instruction;Functional Mobility Training;Cognitive remediation/compensation;Balance training;Manual Therapy;Neuromuscular education;Gait Training;Fluidtherapy;Ultrasound;Aquatic Therapy;Moist Heat;Contrast Bath;Energy conservation;Passive range of motion;Therapeutic activities;Patient/family education    Plan  test bilateral grip strength,  further assess visual perceptual skills    Consulted and Agree with Plan of Care  Patient;Family member/caregiver    Family Member Consulted  dtr       Patient will benefit from skilled therapeutic intervention in order to improve the following deficits and impairments:  Abnormal gait, Decreased balance, Decreased endurance, Decreased mobility, Impaired vision/preception, Impaired perceived functional ability, Decreased range of motion, Decreased knowledge of  precautions, Decreased cognition, Decreased activity tolerance, Decreased coordination, Decreased knowledge of use of DME, Decreased safety awareness, Decreased strength, Impaired UE functional use  Visit Diagnosis: Muscle weakness (generalized)  Visuospatial deficit  Other lack of coordination    Problem List Patient Active Problem List   Diagnosis Date Noted  . Chronic anticoagulation   . Acute lower UTI   . Acute ischemic right PCA stroke (HCC) 06/18/2018  . Ischemic cardiomyopathy   . CVA (cerebral vascular accident) (HCC) 06/14/2018  . Urinary retention 06/05/2018  . Encounter for therapeutic drug monitoring 02/08/2018  . History of CVA (cerebrovascular accident) 12/20/2017  . Coronary artery disease 12/19/2017  . Mild renal insufficiency 12/19/2017  . TIA (transient ischemic attack) 12/19/2017  . Hip flexor tendinitis 05/22/2017  . Abscess of back 05/22/2017  . Pain of both hip joints 04/25/2017  . Dyslipidemia 09/01/2015  . BPH without urinary obstruction 09/01/2015  . DM (diabetes mellitus), type 2, uncontrolled, periph vascular complic (HCC) 07/10/2014  . Essential hypertension 04/20/2009  . Cardiomyopathy, ischemic 04/20/2009  . LEFT BUNDLE BRANCH BLOCK 04/20/2009  . Chronic systolic heart failure (HCC) 04/20/2009    RINE,KATHRYN 07/12/2018, 4:08 PM  [facility] [address] [place], [place], [address] Phone: [phone]   Fax:  [phone]  Name: Karl [name] MRN: [id number] Date of Birth: 12-30-1955

## 2018-07-12 NOTE — Telephone Encounter (Signed)
New message    Pt would like a call to talk about a handicap sticker.

## 2018-07-12 NOTE — Patient Instructions (Signed)
  Coordination Activities  Perform the following activities for 20 minutes 1 times per day with left hand(s).   Rotate ball in fingertips (clockwise and counter-clockwise).  Flip cards 1 at a time as fast as you can.  Deal cards with your thumb (Hold deck in hand and push card off top with thumb).  Pick up coins and place in container or coin bank.    1. Grip Strengthening (Resistive Putty)   Squeeze putty using thumb and all fingers. Repeat _20___ times. Do __2__ sessions per day.   2. Roll putty into tube on table and pinch between each finger and thumb x 10 reps each. (can do ring and small finger together)   Hold a medium size ball between your hands while laying on your back Perform chest press 10x Raise ball up overhead with arms straight 10x  Copyright  VHI. All rights reserved.

## 2018-07-14 NOTE — Progress Notes (Signed)
Subjective:    Patient ID: Karl [name], male    DOB: [birth year], 62 y.o.   MRN: [id number]  HPI The patient is here for follow up from the hospital.  Admitted 06/14/18 - 06/18/18 for an acute embolic stroke.  Rehab from 7/16 - 8/6.  Currently at home.   He had a TURP on 7/3 and his warfarin was held.  He had a foley in place.  He presented to ED with dizziness and unsteadiness.  He was found to have a subtherapeutic INR he is not a clot in the LV apex.  He was started on IV heparin and continued on warfarin.   Acute embolic stroke: Neurology was consulted Echo showed clot in apex Started on IV heparin and remained on warfarin INR therapeutic and heparin stopped CT angio of head and neck did not show stenosis of carotid arteries LDL 182, a1c 6.8 PT, OT saw Pt  --inpatient rehabilitation recommended Intolerant to lipitor, start on crestor He has left sided weakness with left hemianopsia and left inattention Doing outpatient rehab  Clot in apex On anticoagularion with warfarin EF 20%  Nausea, vomiting Resolved Unclear etiology ? Secondary to stroke vs constipation and UTI  Abnormal UA with positive urine cx: ucx pos for klebsiella and enterococcus Started on keflex previously - continued for 5 more days  BPH, urinary retention s/p TURP 7/3 On flomax and proscar Had foley  - kept until he sees urology outpatient, but Foley was removed during rehab Proscar and Flomax discontinued during rehab  Hypertenstion: Initially allowed permissive hypertension Potassium repleted BP controlled  CAD Continued home meds, but started on crestor  A/C, warfarin INR subtherapeutic on admission IV heparin initially then stopped when INR therapeutic  Diabetes a1c controlled SSI, oral meds held Resumed metformin on d/c  Constipation Stool softeners, laxatives prn   He is doing outpatient rehab.  Can walk around with a walker at home.  Uses a wheelchair for longer distances.   He denies loss of sensation, but has some tingling in tips of fingers in left hand.  He still has left-sided weakness and inattention, but states there has been some improvement with rehabilitation.  He still has some loss of his left peripheral vision.  Overall he feels well and has no concerns.  Hypertension: He is taking his medication daily as prescribed.  He is eating healthy and is compliant with a low-sodium diet.  He is exercising regularly.  Diabetes: He is compliant with a diabetic diet.  He is taking metformin daily.  He needs more lancets or a new glucometer because he has not been able to check his sugars.  He states that they were well controlled when he was able to check them.  They were also well controlled in rehab.  Hyperlipidemia: He was started on Crestor and has been able to tolerate that.  He is eating a healthy diet and exercising.  Acute embolic stroke, clot in heart: He is taking warfarin daily as prescribed.  He follows with cardiology for dosing.  Constipation: He denies any constipation.  He states his bowel movements are normal and he goes once a day.      Medications and allergies reviewed with patient and updated if appropriate.  Patient Active Problem List   Diagnosis Date Noted  . Chronic anticoagulation   . Acute lower UTI   . Acute ischemic right PCA stroke (HCC) 06/18/2018  . Ischemic cardiomyopathy   . CVA (cerebral vascular accident) (HCC) 06/14/2018  .  Urinary retention 06/05/2018  . Encounter for therapeutic drug monitoring 02/08/2018  . History of CVA (cerebrovascular accident) 12/20/2017  . Coronary artery disease 12/19/2017  . Mild renal insufficiency 12/19/2017  . TIA (transient ischemic attack) 12/19/2017  . Hip flexor tendinitis 05/22/2017  . Abscess of back 05/22/2017  . Pain of both hip joints 04/25/2017  . Dyslipidemia 09/01/2015  . BPH without urinary obstruction 09/01/2015  . DM (diabetes mellitus), type 2, uncontrolled, periph  vascular complic (HCC) 07/10/2014  . Essential hypertension 04/20/2009  . Cardiomyopathy, ischemic 04/20/2009  . LEFT BUNDLE BRANCH BLOCK 04/20/2009  . Chronic systolic heart failure (HCC) 04/20/2009    Current Outpatient Medications on File Prior to Visit  Medication Sig Dispense Refill  . acetaminophen (TYLENOL) 325 MG tablet Take 1-2 tablets (325-650 mg total) by mouth every 4 (four) hours as needed for mild pain.    . carvedilol (COREG) 12.5 MG tablet Take 1 tablet (12.5 mg total) by mouth 2 (two) times daily with a meal. 60 tablet 0  . metFORMIN (GLUCOPHAGE) 1000 MG tablet TAKE 1 TABLET BY MOUTH TWICE DAILY WITH A MEAL (Patient taking differently: TAKE 1 TABLET (1000mg ) BY MOUTH TWICE DAILY WITH A MEAL) 90 tablet 1  . polyethylene glycol (MIRALAX / GLYCOLAX) packet Take 17 g by mouth 2 (two) times daily. 60 each 0  . potassium chloride (K-DUR) 10 MEQ tablet Take 1 tablet (10 mEq total) by mouth 2 (two) times daily. 60 tablet 0  . rosuvastatin (CRESTOR) 40 MG tablet Take 1 tablet (40 mg total) by mouth daily at 6 PM. 30 tablet 0  . warfarin (COUMADIN) 5 MG tablet Take two pill today with supper. Alternate with one and one half pill every other day. 60 tablet 0  . finasteride (PROSCAR) 5 MG tablet Take 5 mg by mouth daily.    . tamsulosin (FLOMAX) 0.4 MG CAPS capsule Take 1 capsule (0.4 mg total) by mouth daily after supper. (Patient not taking: Reported on 07/15/2018) 90 capsule 1   No current facility-administered medications on file prior to visit.     Past Medical History:  Diagnosis Date  . Acute CVA (cerebrovascular accident) (HCC) 06/14/2018  . Coronary artery disease    blockage   Stent Dr. Ladona Ridgel 15 years 1998  . Dyspnea   . Hypertension   . Stroke [facility]) 2019   denies residual on 06/14/2018  . Type II diabetes mellitus (HCC) 07/2014 dx    Past Surgical History:  Procedure Laterality Date  . CORONARY ANGIOPLASTY WITH STENT PLACEMENT  1998  . FRACTURE SURGERY    . GREEN  LIGHT LASER TURP (TRANSURETHRAL RESECTION OF PROSTATE N/A 06/05/2018   Procedure: GREEN LIGHT LASER TURP , TRANSURETHRAL RESECTION OF PROSTATE;  Surgeon: Crista Elliot, MD;  Location: [facility];  Service: Urology;  Laterality: N/A;  . TIBIA FRACTURE SURGERY Left 1980s    Social History   Socioeconomic History  . Marital status: Married    Spouse name: Not on file  . Number of children: Not on file  . Years of education: Not on file  . Highest education level: Not on file  Occupational History  . Not on file  Social Needs  . Financial resource strain: Not on file  . Food insecurity:    Worry: Not on file    Inability: Not on file  . Transportation needs:    Medical: Not on file    Non-medical: Not on file  Tobacco Use  . Smoking status: Former  Smoker    Packs/day: 2.00    Years: 5.00    Pack years: 10.00    Types: Cigarettes    Last attempt to quit: 12/04/2000    Years since quitting: 17.6  . Smokeless tobacco: Never Used  Substance and Sexual Activity  . Alcohol use: Yes    Comment: 06/14/2018 "nothing in 2019"  . Drug use: Not Currently  . Sexual activity: Not on file  Lifestyle  . Physical activity:    Days per week: Not on file    Minutes per session: Not on file  . Stress: Not on file  Relationships  . Social connections:    Talks on phone: Not on file    Gets together: Not on file    Attends religious service: Not on file    Active member of club or organization: Not on file    Attends meetings of clubs or organizations: Not on file    Relationship status: Not on file  Other Topics Concern  . Not on file  Social History Narrative   Former smoker - quit 1998 after stent -    Lives with dtr and 2 g-sons   Separated from wife in 2005, good terms   Employed with [employer] - walks at lot at work   No regular exercise    Family History  Problem Relation Age of Onset  . Diabetes Mother   . Diabetes Father   . Stroke Father 71  .  Diabetes Sister   . Clotting disorder Sister 7  . Diabetes Other        nephew  . Diabetes Brother   . Diabetes Maternal Grandmother     Review of Systems  Constitutional: Negative for appetite change, chills and fever.  Eyes: Positive for visual disturbance (loss of vision left periphery).  Respiratory: Negative for cough, shortness of breath and wheezing.   Cardiovascular: Negative for chest pain, palpitations and leg swelling.  Gastrointestinal: Negative for abdominal pain, blood in stool, constipation, diarrhea and nausea.  Genitourinary: Positive for hematuria (occ from recent surgery). Negative for difficulty urinating and dysuria.  Neurological: Negative for dizziness, light-headedness and headaches.  Psychiatric/Behavioral: Negative for dysphoric mood and sleep disturbance. The patient is not nervous/anxious.        Objective:   Vitals:   07/15/18 1032  BP: 100/70  Pulse: 76  Resp: 16  Temp: 98.3 F (36.8 C)  SpO2: 98%   BP Readings from Last 3 Encounters:  07/15/18 100/70  07/09/18 130/74  06/18/18 110/83   Wt Readings from Last 3 Encounters:  07/15/18 196 lb (88.9 kg)  07/05/18 198 lb 3.1 oz (89.9 kg)  06/14/18 232 lb (105.2 kg)   Body mass index is 25.16 kg/m.   Physical Exam    Constitutional: Appears well-developed and well-nourished. No distress.  HENT:  Head: Normocephalic and atraumatic.  Neck: Neck supple. No tracheal deviation present. No thyromegaly present.  No cervical lymphadenopathy Cardiovascular: Normal rate, regular rhythm and normal heart sounds.   No murmur heard. No carotid bruit .  No edema Pulmonary/Chest: Effort normal and breath sounds normal. No respiratory distress. No has no wheezes. No rales.  Abdomen: Soft, nontender, nondistended Neurological: Normal sensation all extremities, 4/5 weakness left upper and lower extremities, normal strength right arm and leg Skin: Skin is warm and dry. Not diaphoretic.  Psychiatric: Normal  mood and affect. Behavior is normal.      Assessment & Plan:    See Problem List  for Assessment and Plan of chronic medical problems.

## 2018-07-15 ENCOUNTER — Telehealth: Payer: Self-pay | Admitting: Internal Medicine

## 2018-07-15 ENCOUNTER — Encounter: Payer: Self-pay | Admitting: Internal Medicine

## 2018-07-15 ENCOUNTER — Ambulatory Visit: Payer: Managed Care, Other (non HMO) | Admitting: Internal Medicine

## 2018-07-15 VITALS — BP 100/70 | HR 76 | Temp 98.3°F | Resp 16 | Wt 196.0 lb

## 2018-07-15 DIAGNOSIS — IMO0002 Reserved for concepts with insufficient information to code with codable children: Secondary | ICD-10-CM

## 2018-07-15 DIAGNOSIS — N400 Enlarged prostate without lower urinary tract symptoms: Secondary | ICD-10-CM

## 2018-07-15 DIAGNOSIS — E785 Hyperlipidemia, unspecified: Secondary | ICD-10-CM | POA: Diagnosis not present

## 2018-07-15 DIAGNOSIS — I63531 Cerebral infarction due to unspecified occlusion or stenosis of right posterior cerebral artery: Secondary | ICD-10-CM

## 2018-07-15 DIAGNOSIS — E1151 Type 2 diabetes mellitus with diabetic peripheral angiopathy without gangrene: Secondary | ICD-10-CM

## 2018-07-15 DIAGNOSIS — I10 Essential (primary) hypertension: Secondary | ICD-10-CM

## 2018-07-15 DIAGNOSIS — E1165 Type 2 diabetes mellitus with hyperglycemia: Secondary | ICD-10-CM

## 2018-07-15 MED ORDER — BLOOD GLUCOSE MONITOR KIT: PACK | 0 refills | Status: DC

## 2018-07-15 MED ORDER — LANCETS 30G MISC: 3 refills | Status: AC

## 2018-07-15 NOTE — Assessment & Plan Note (Signed)
BP well controlled Current regimen effective and well tolerated Continue current medications at current doses  

## 2018-07-15 NOTE — Assessment & Plan Note (Signed)
Currently not on any medication No difficulty urinating or urinary symptoms We will follow-up with urology

## 2018-07-15 NOTE — Assessment & Plan Note (Signed)
Embolic-related to clot in apex, due to stopping warfarin for TURP Taking warfarin, Crestor Blood pressure well controlled, sugars well controlled Doing outpatient rehab

## 2018-07-15 NOTE — Telephone Encounter (Signed)
Copied from [place] [id number]. Topic: General - Other >> Jul 15, 2018 11:38 AM Carolyn Stare wrote:  Pt called Pharmacy received the lancets RX but not the meter rx blood glucose meter kit and supplies [place]

## 2018-07-15 NOTE — Assessment & Plan Note (Signed)
Last A1c was well controlled We will refill lancets or a new glucometer to help to monitor her sugars at home Continue metformin at current dose Continue regular activity Continue diabetic diet Follow-up in approximately 3 months

## 2018-07-15 NOTE — Assessment & Plan Note (Signed)
Did not tolerate Lipitor Started on Crestor and seems to be tolerating that well

## 2018-07-15 NOTE — Telephone Encounter (Signed)
RX refaxed to [facility]

## 2018-07-15 NOTE — Patient Instructions (Addendum)
  Medications reviewed and updated.  No changes recommended at this time.  Your prescription(s) have been submitted to your pharmacy. Please take as directed and contact our office if you believe you are having problem(s) with the medication(s).   Please followup in December

## 2018-07-16 ENCOUNTER — Ambulatory Visit: Payer: Managed Care, Other (non HMO) | Admitting: Occupational Therapy

## 2018-07-16 DIAGNOSIS — M6281 Muscle weakness (generalized): Secondary | ICD-10-CM

## 2018-07-16 DIAGNOSIS — R41842 Visuospatial deficit: Secondary | ICD-10-CM

## 2018-07-16 DIAGNOSIS — R278 Other lack of coordination: Secondary | ICD-10-CM

## 2018-07-16 NOTE — Therapy (Signed)
[facility] [address] [place], [place], [address] Phone: [phone]   Fax:  [phone]  Occupational Therapy Treatment  Patient Details  Name: Karl [name] MRN: [id number] Date of Birth: [birth year] Referring Provider: Dr. Riley Kill   Encounter Date: 07/16/2018    Past Medical History:  Diagnosis Date  . Acute CVA (cerebrovascular accident) (HCC) 06/14/2018  . Coronary artery disease    blockage   Stent Dr. Ladona Ridgel 15 years 1998  . Dyspnea   . Hypertension   . Stroke [facility]) 2019   denies residual on 06/14/2018  . Type II diabetes mellitus (HCC) 07/2014 dx    Past Surgical History:  Procedure Laterality Date  . CORONARY ANGIOPLASTY WITH STENT PLACEMENT  1998  . FRACTURE SURGERY    . GREEN LIGHT LASER TURP (TRANSURETHRAL RESECTION OF PROSTATE N/A 06/05/2018   Procedure: GREEN LIGHT LASER TURP , TRANSURETHRAL RESECTION OF PROSTATE;  Surgeon: Crista Elliot, MD;  Location: [facility];  Service: Urology;  Laterality: N/A;  . TIBIA FRACTURE SURGERY Left 1980s    There were no vitals filed for this visit.        Environmental scanning 63 % located on first pass while ambulating with close supervision-minguard.. Pt located 5/6 remaining cues on second pass. Placing pieces into perfection board  For LUE functional use and visual perceptual skills with LUE, increased time and min difficulty. Completing a 12 piece puzzle using bilateral UE's,for improved visual perceptual skills and incorporating LUE into a bilateral task, pt completed with only 1 v.c Pt forgot his glasses, plan to perform number cancellation next visit.                OT Short Term Goals - 07/10/18 1842      OT SHORT TERM GOAL #1   Title  I with inial HEP    Time  4    Period  Weeks    Status  New    Target Date  07/14/18      OT SHORT TERM GOAL #2   Title  Pt will perform all basic ADLs modified independently.    Time  4    Period  Weeks    Status  New      OT SHORT TERM GOAL #3   Title  Pt will demonstrate improved fine motor coordination for ADLS as evidenced by decreasing 9 hole peg test score to 75 secs or less.    Time  4    Period  Weeks    Status  New      OT SHORT TERM GOAL #4   Title  Pt will perfrom snack prep/ basic home management with supervision.    Time  4    Period  Weeks    Status  New      OT SHORT TERM GOAL #5   Title  Pt will verbalize understanding of compensatory strategiues for short term memory deficits.    Time  4    Period  Weeks    Status  New      Additional Short Term Goals   Additional Short Term Goals  Yes      OT SHORT TERM GOAL #6   Title  Further assess vision and cognition and set goals prn    Time  4    Period  Weeks    Status  New        OT Long Term Goals - 07/10/18 1850  OT LONG TERM GOAL #1   Title  I with updated HEP.    Time  8    Period  Weeks    Status  New    Target Date  09/08/18      OT LONG TERM GOAL #2   Title  Pt will perform basic home managmeent and cooking at a modified independent level demonstrating good safety awareness.    Time  8    Period  Weeks    Status  New      OT LONG TERM GOAL #3   Title  Pt will navigate a busy environment and locate items with 90% or better accuracy.    Time  8    Period  Weeks    Status  New      OT LONG TERM GOAL #4   Title  Pt will demonstrate improved fine motor coordination for ADLs as evidenced by decreasing 9 hole peg test score to 60 secs or less.    Time  8    Period  Weeks    Status  New      OT LONG TERM GOAL #5   Title  Pt will demonstrate improved LUE strength as evidenced by retrieving a 3 lbs item from overhead shelf at 130 without drops.    Time  8    Period  Weeks    Status  New            Plan - 07/16/18 1313    Clinical Impression Statement  Pt is progressing towards goals for LUE functional use however he demonstrates difficulties with environmental  scanning.    Occupational performance deficits (Please refer to evaluation for details):  ADL's;IADL's;Work;Leisure;Social Participation    Rehab Potential  Good    Current Impairments/barriers affecting progress:  cognitive and visual perceptual deficits, decreased balance    OT Frequency  --   16 visits   OT Duration  12 weeks    OT Treatment/Interventions  Self-care/ADL training;Paraffin;Therapeutic exercise;DME and/or AE instruction;Functional Mobility Training;Cognitive remediation/compensation;Balance training;Manual Therapy;Neuromuscular education;Gait Training;Fluidtherapy;Ultrasound;Aquatic Therapy;Moist Heat;Contrast Bath;Energy conservation;Passive range of motion;Therapeutic activities;Patient/family education    Plan  perfrom number cancellation if pt brings in glasses    Consulted and Agree with Plan of Care  Patient;Family member/caregiver    Family Member Consulted  dtr       Patient will benefit from skilled therapeutic intervention in order to improve the following deficits and impairments:  Abnormal gait, Decreased balance, Decreased endurance, Decreased mobility, Impaired vision/preception, Impaired perceived functional ability, Decreased range of motion, Decreased knowledge of precautions, Decreased cognition, Decreased activity tolerance, Decreased coordination, Decreased knowledge of use of DME, Decreased safety awareness, Decreased strength, Impaired UE functional use  Visit Diagnosis: Muscle weakness (generalized)  Visuospatial deficit  Other lack of coordination    Problem List Patient Active Problem List   Diagnosis Date Noted  . Chronic anticoagulation   . Acute ischemic right PCA stroke (HCC) 06/18/2018  . CVA (cerebral vascular accident) (HCC) 06/14/2018  . Urinary retention 06/05/2018  . Encounter for therapeutic drug monitoring 02/08/2018  . History of CVA (cerebrovascular accident) 12/20/2017  . Coronary artery disease 12/19/2017  . Mild renal  insufficiency 12/19/2017  . TIA (transient ischemic attack) 12/19/2017  . Hip flexor tendinitis 05/22/2017  . Pain of both hip joints 04/25/2017  . Dyslipidemia 09/01/2015  . BPH without urinary obstruction 09/01/2015  . DM (diabetes mellitus), type 2, uncontrolled, periph vascular complic (HCC) 07/10/2014  . Essential hypertension 04/20/2009  .  Cardiomyopathy, ischemic 04/20/2009  . LEFT BUNDLE BRANCH BLOCK 04/20/2009  . Chronic systolic heart failure (HCC) 04/20/2009    RINE,KATHRYN 07/16/2018, 1:55 PM  [facility] [address] [place], [place], [address] Phone: [phone]   Fax:  [phone]  Name: ZYMEIR SALMINEN MRN: [id number] Date of Birth: June 21, 1956

## 2018-07-18 NOTE — Telephone Encounter (Signed)
Per Dr. Junie Spencer for Pt to have handicap sticker.    Attempted outreach to Pt.  No answer and no VM.  Will mail handicap application to Pt.  No further needs at this time.

## 2018-07-19 ENCOUNTER — Ambulatory Visit (INDEPENDENT_AMBULATORY_CARE_PROVIDER_SITE_OTHER): Payer: Managed Care, Other (non HMO) | Admitting: *Deleted

## 2018-07-19 DIAGNOSIS — Z5181 Encounter for therapeutic drug level monitoring: Secondary | ICD-10-CM | POA: Diagnosis not present

## 2018-07-19 DIAGNOSIS — G459 Transient cerebral ischemic attack, unspecified: Secondary | ICD-10-CM | POA: Diagnosis not present

## 2018-07-19 DIAGNOSIS — Z8673 Personal history of transient ischemic attack (TIA), and cerebral infarction without residual deficits: Secondary | ICD-10-CM

## 2018-07-19 LAB — POCT INR: INR: 3.4 — AB (ref 2.0–3.0)

## 2018-07-19 NOTE — Patient Instructions (Addendum)
Description   Today take only 1 tablet then start taking 1.5 tablets daily,except 2 tablets on Mondays, and Fridays. Recheck INR in 10 days.  Call the Coumadin clinic with any questions #[phone].

## 2018-07-22 ENCOUNTER — Other Ambulatory Visit: Payer: Self-pay

## 2018-07-22 ENCOUNTER — Encounter
Payer: Managed Care, Other (non HMO) | Attending: Physical Medicine & Rehabilitation | Admitting: Physical Medicine & Rehabilitation

## 2018-07-22 ENCOUNTER — Encounter: Payer: Self-pay | Admitting: Physical Medicine & Rehabilitation

## 2018-07-22 VITALS — BP 136/79 | HR 80 | Ht 74.5 in | Wt 179.0 lb

## 2018-07-22 DIAGNOSIS — R0600 Dyspnea, unspecified: Secondary | ICD-10-CM | POA: Insufficient documentation

## 2018-07-22 DIAGNOSIS — I2510 Atherosclerotic heart disease of native coronary artery without angina pectoris: Secondary | ICD-10-CM | POA: Insufficient documentation

## 2018-07-22 DIAGNOSIS — I69393 Ataxia following cerebral infarction: Secondary | ICD-10-CM | POA: Insufficient documentation

## 2018-07-22 DIAGNOSIS — I10 Essential (primary) hypertension: Secondary | ICD-10-CM | POA: Insufficient documentation

## 2018-07-22 DIAGNOSIS — I69398 Other sequelae of cerebral infarction: Secondary | ICD-10-CM | POA: Insufficient documentation

## 2018-07-22 DIAGNOSIS — E119 Type 2 diabetes mellitus without complications: Secondary | ICD-10-CM | POA: Insufficient documentation

## 2018-07-22 DIAGNOSIS — Z7901 Long term (current) use of anticoagulants: Secondary | ICD-10-CM | POA: Diagnosis not present

## 2018-07-22 DIAGNOSIS — R531 Weakness: Secondary | ICD-10-CM | POA: Insufficient documentation

## 2018-07-22 DIAGNOSIS — Z87891 Personal history of nicotine dependence: Secondary | ICD-10-CM | POA: Diagnosis not present

## 2018-07-22 DIAGNOSIS — I63531 Cerebral infarction due to unspecified occlusion or stenosis of right posterior cerebral artery: Secondary | ICD-10-CM

## 2018-07-22 HISTORY — DX: Ataxia following cerebral infarction: I69.393

## 2018-07-22 NOTE — Patient Instructions (Addendum)
YOU NEED TO GET YOUR SUGARS DOWN INTO TH 85-150 RANGE

## 2018-07-22 NOTE — Progress Notes (Signed)
Subjective:    Patient ID: Karl [name], male    DOB: 1956-04-21, 62 y.o.   MRN: [id number]  HPI   [name] is here in follow up of his bilateral posterior circulation infarcts. He has been home since 8/6 and is involved in outpt therapy at neuro-rehab.   He is using a walker at home. He hasn't fallen yet but had a couple near misses,  But someone is always nearby when he moves. He is emptying his bladder and bowels without issue. Sleep is good as is his mood. Appetite is improving. He states his sugars are under control, with highs being over 200- on occasion. He denies pain.   Pain Inventory Average Pain 0 Pain Right Now 0 My pain is no pain  In the last 24 hours, has pain interfered with the following? General activity 0 Relation with others 0 Enjoyment of life 0 What TIME of day is your pain at its worst? no pain Sleep (in general) Good  Pain is worse with: no pain Pain improves with: no pain Relief from Meds: no pain  Mobility walk with assistance use a walker how many minutes can you walk? 5 ability to climb steps?  no do you drive?  no use a wheelchair  Function disabled: date disabled n/a  Neuro/Psych tingling trouble walking  Prior Studies Any changes since last visit?  no  Physicians involved in your care Any changes since last visit?  no Primary care Dr. Cheryll Cockayne   Family History  Problem Relation Age of Onset  . Diabetes Mother   . Diabetes Father   . Stroke Father 76  . Diabetes Sister   . Clotting disorder Sister 60  . Diabetes Other        nephew  . Diabetes Brother   . Diabetes Maternal Grandmother    Social History   Socioeconomic History  . Marital status: Married    Spouse name: Not on file  . Number of children: Not on file  . Years of education: Not on file  . Highest education level: Not on file  Occupational History  . Not on file  Social Needs  . Financial resource strain: Not on file  . Food insecurity:   Worry: Not on file    Inability: Not on file  . Transportation needs:    Medical: Not on file    Non-medical: Not on file  Tobacco Use  . Smoking status: Former Smoker    Packs/day: 2.00    Years: 5.00    Pack years: 10.00    Types: Cigarettes    Last attempt to quit: 12/04/2000    Years since quitting: 17.6  . Smokeless tobacco: Never Used  Substance and Sexual Activity  . Alcohol use: Yes    Comment: 06/14/2018 "nothing in 2019"  . Drug use: Not Currently  . Sexual activity: Not on file  Lifestyle  . Physical activity:    Days per week: Not on file    Minutes per session: Not on file  . Stress: Not on file  Relationships  . Social connections:    Talks on phone: Not on file    Gets together: Not on file    Attends religious service: Not on file    Active member of club or organization: Not on file    Attends meetings of clubs or organizations: Not on file    Relationship status: Not on file  Other Topics Concern  . Not on file  Social History Narrative   Former smoker - quit 1998 after stent -    Lives with dtr and 2 g-sons   Separated from wife in 2005, good terms   Employed with [employer] - walks at lot at work   No regular exercise   Past Surgical History:  Procedure Laterality Date  . CORONARY ANGIOPLASTY WITH STENT PLACEMENT  1998  . FRACTURE SURGERY    . GREEN LIGHT LASER TURP (TRANSURETHRAL RESECTION OF PROSTATE N/A 06/05/2018   Procedure: GREEN LIGHT LASER TURP , TRANSURETHRAL RESECTION OF PROSTATE;  Surgeon: Crista Elliot, MD;  Location: [facility];  Service: Urology;  Laterality: N/A;  . TIBIA FRACTURE SURGERY Left 1980s   Past Medical History:  Diagnosis Date  . Acute CVA (cerebrovascular accident) (HCC) 06/14/2018  . Coronary artery disease    blockage   Stent Dr. Ladona Ridgel 15 years 1998  . Dyspnea   . Hypertension   . Stroke [facility]) 2019   denies residual on 06/14/2018  . Type II diabetes mellitus (HCC) 07/2014 dx   BP 136/79    Pulse 80   Ht 6' 2.5" (1.892 m) Comment: pt reported, in wheelchair  Wt 179 lb (81.2 kg) Comment: pt reported, in wheelchair  SpO2 98%   BMI 22.67 kg/m   Opioid Risk Score:   Fall Risk Score:  `1  Depression screen PHQ 2/9  Depression screen PHQ 2/9 07/22/2018  Decreased Interest 0  Down, Depressed, Hopeless 0  PHQ - 2 Score 0    Review of Systems  Constitutional: Positive for unexpected weight change.  HENT: Negative.   Eyes: Negative.   Respiratory: Negative.   Cardiovascular: Negative.   Gastrointestinal: Negative.   Endocrine: Negative.   Genitourinary: Negative.   Musculoskeletal: Negative.   Skin: Negative.   Allergic/Immunologic: Negative.   Neurological: Negative.   Hematological: Negative.   Psychiatric/Behavioral: Negative.   All other systems reviewed and are negative.      Objective:   Physical Exam  General: Alert and oriented x 3, No apparent distress HEENT: Head is normocephalic, atraumatic, PERRLA, EOMI, sclera anicteric, oral mucosa pink and moist, dentition intact, ext ear canals clear,  Neck: Supple without JVD or lymphadenopathy Heart: Reg rate and rhythm. No murmurs rubs or gallops Chest: CTA bilaterally without wheezes, rales, or rhonchi; no distress Abdomen: Soft, non-tender, non-distended, bowel sounds positive. Extremities: No clubbing, cyanosis, or edema. Pulses are 2+ Skin: Clean and intact without signs of breakdown Neuro: Pt is cognitively appropriate with normal insight, memory, and awareness. Cranial nerves 2-12 are intact. Sensory exam is normal. Reflexes are 2+ in all 4's. Fine motor coordination is intact. No tremors. Motor function is grossly 5/5 RUE and RLE. 4+ LUE and LLE. Left limb ataxia with decreased HTS, FTN. Has diffculty holding weight on left leg without using walker.  Musculoskeletal: Full ROM, No pain with AROM or PROM in the neck, trunk, or extremities. Posture appropriate Psych: Pt's affect is appropriate. Pt is  cooperative       Assessment & Plan:  1. Functional deficits secondary to bilateral posterior circulation, right posterior cerebral artery right superior cerebellar artery embolic infarcts              -continue with outpt therapies at neuro-rehab  -needs walker at all times at this point 2. DVT Prophylaxis/Anticoagulation:   Coumadinper primary                3. Pain Management: tylenol prn  4. CAD  with ICM: Coumadin with INR goal 2-3. Continue coreg and Crestor.  5.T2DM: per primary  -needs better control  -needs to check sugar AC and HS to get better idea where his numbers are at 6. HTN-improved control.    Fifteen minutes of face to face patient care time were spent during this visit. All questions were encouraged and answered.  Follow up in 2 months.

## 2018-07-23 ENCOUNTER — Ambulatory Visit: Payer: Managed Care, Other (non HMO) | Admitting: Occupational Therapy

## 2018-07-25 ENCOUNTER — Ambulatory Visit: Payer: Managed Care, Other (non HMO) | Admitting: Occupational Therapy

## 2018-07-25 DIAGNOSIS — R41842 Visuospatial deficit: Secondary | ICD-10-CM

## 2018-07-25 DIAGNOSIS — M6281 Muscle weakness (generalized): Secondary | ICD-10-CM | POA: Diagnosis not present

## 2018-07-25 DIAGNOSIS — I69315 Cognitive social or emotional deficit following cerebral infarction: Secondary | ICD-10-CM

## 2018-07-25 DIAGNOSIS — R278 Other lack of coordination: Secondary | ICD-10-CM

## 2018-07-25 NOTE — Therapy (Signed)
[facility] [name] [place], [place], [address] Phone: [phone]   Fax:  [phone]  Occupational Therapy Treatment  Patient Details  Name: Karl [name] MRN: [id number] Date of Birth: 1956/01/20 Referring Provider: Dr. Riley Kill   Encounter Date: 07/25/2018  OT End of Session - 07/25/18 1411    Visit Number  4    Number of Visits  17    Date for OT Re-Evaluation  09/08/18    Authorization Type  [employer]    Authorization - Visit Number  3    Authorization - Number of Visits  30    OT Start Time  1406    OT Stop Time  1445    OT Time Calculation (min)  39 min    Activity Tolerance  Patient tolerated treatment well    Behavior During Therapy  [facility] for tasks assessed/performed       Past Medical History:  Diagnosis Date  . Acute CVA (cerebrovascular accident) (HCC) 06/14/2018  . Coronary artery disease    blockage   Stent Dr. Ladona Ridgel 15 years 1998  . Dyspnea   . Hypertension   . Stroke [facility]) 2019   denies residual on 06/14/2018  . Type II diabetes mellitus (HCC) 07/2014 dx    Past Surgical History:  Procedure Laterality Date  . CORONARY ANGIOPLASTY WITH STENT PLACEMENT  1998  . FRACTURE SURGERY    . GREEN LIGHT LASER TURP (TRANSURETHRAL RESECTION OF PROSTATE N/A 06/05/2018   Procedure: GREEN LIGHT LASER TURP , TRANSURETHRAL RESECTION OF PROSTATE;  Surgeon: Crista Elliot, MD;  Location: [facility];  Service: Urology;  Laterality: N/A;  . TIBIA FRACTURE SURGERY Left 1980s    There were no vitals filed for this visit.  Subjective Assessment - 07/25/18 1442    Subjective   Deneis pain    Pertinent History  CVA 06/14/18, T2DM, CAD with ICM on chronic coumadin, prior CVA 12/2017, TURP 06/05/18 for BPH    Patient Stated Goals  regain independence    Currently in Pain?  No/denies              Treatment: 1.5 M number cancellation with !00% accuracy. Supine chest press and shoulder flexion closed chain in  supine with min facilitation to left scapula/ shoulder. Copying small peg design with LUE for increased fine motor coordination with a cognitive component, increased time, mod difficulty due to coordination, pt copied design correctly. Ambulating to waiting room with walker, close supervision, min v.c for upright posture and larger steps left foot.                  OT Short Term Goals - 07/10/18 1842      OT SHORT TERM GOAL #1   Title  I with inial HEP    Time  4    Period  Weeks    Status  New    Target Date  07/14/18      OT SHORT TERM GOAL #2   Title  Pt will perform all basic ADLs modified independently.    Time  4    Period  Weeks    Status  New      OT SHORT TERM GOAL #3   Title  Pt will demonstrate improved fine motor coordination for ADLS as evidenced by decreasing 9 hole peg test score to 75 secs or less.    Time  4    Period  Weeks    Status  New  OT SHORT TERM GOAL #4   Title  Pt will perfrom snack prep/ basic home management with supervision.    Time  4    Period  Weeks    Status  New      OT SHORT TERM GOAL #5   Title  Pt will verbalize understanding of compensatory strategiues for short term memory deficits.    Time  4    Period  Weeks    Status  New      Additional Short Term Goals   Additional Short Term Goals  Yes      OT SHORT TERM GOAL #6   Title  Further assess vision and cognition and set goals prn    Time  4    Period  Weeks    Status  New        OT Long Term Goals - 07/10/18 1850      OT LONG TERM GOAL #1   Title  I with updated HEP.    Time  8    Period  Weeks    Status  New    Target Date  09/08/18      OT LONG TERM GOAL #2   Title  Pt will perform basic home managmeent and cooking at a modified independent level demonstrating good safety awareness.    Time  8    Period  Weeks    Status  New      OT LONG TERM GOAL #3   Title  Pt will navigate a busy environment and locate items with 90% or better accuracy.     Time  8    Period  Weeks    Status  New      OT LONG TERM GOAL #4   Title  Pt will demonstrate improved fine motor coordination for ADLs as evidenced by decreasing 9 hole peg test score to 60 secs or less.    Time  8    Period  Weeks    Status  New      OT LONG TERM GOAL #5   Title  Pt will demonstrate improved LUE strength as evidenced by retrieving a 3 lbs item from overhead shelf at 130 without drops.    Time  8    Period  Weeks    Status  New            Plan - 07/25/18 1444    Clinical Impression Statement  Pt is progressing towards goals. HE completed 1.5 M number cancellation without errors.    Occupational Profile and client history currently impacting functional performance   Pt was completely I and working full time prior to CVA, currently he is unable to work. CVA 06/14/18,Type II DM, CAD with ICM on chronic coumadin, prior CVA 12/2017, TURP 06/05/18 for BPH     Occupational performance deficits (Please refer to evaluation for details):  ADL's;IADL's;Work;Leisure;Social Participation    Rehab Potential  Good    Current Impairments/barriers affecting progress:  cognitive and visual perceptual deficits, decreased balance    OT Frequency  --   16 visits   OT Duration  12 weeks    OT Treatment/Interventions  Self-care/ADL training;Paraffin;Therapeutic exercise;DME and/or AE instruction;Functional Mobility Training;Cognitive remediation/compensation;Balance training;Manual Therapy;Neuromuscular education;Gait Training;Fluidtherapy;Ultrasound;Aquatic Therapy;Moist Heat;Contrast Bath;Energy conservation;Passive range of motion;Therapeutic activities;Patient/family education    Plan  continue to address LUE neuro re-ed, visual perceptual skills    Consulted and Agree with Plan of Care  Patient;Family member/caregiver  Patient will benefit from skilled therapeutic intervention in order to improve the following deficits and impairments:  Abnormal gait, Decreased balance,  Decreased endurance, Decreased mobility, Impaired vision/preception, Impaired perceived functional ability, Decreased range of motion, Decreased knowledge of precautions, Decreased cognition, Decreased activity tolerance, Decreased coordination, Decreased knowledge of use of DME, Decreased safety awareness, Decreased strength, Impaired UE functional use  Visit Diagnosis: Muscle weakness (generalized)  Visuospatial deficit  Other lack of coordination  Cognitive social or emotional deficit following cerebral infarction    Problem List Patient Active Problem List   Diagnosis Date Noted  . 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 Chronic anticoagulation   . Acute ischemic right PCA stroke (HCC) 06/18/2018  . CVA (cerebral vascular accident) (HCC) 06/14/2018  . Urinary retention 06/05/2018  . Encounter for therapeutic drug monitoring 02/08/2018  . History of CVA (cerebrovascular accident) 12/20/2017  . Coronary artery disease 12/19/2017  . Mild renal insufficiency 12/19/2017  . TIA (transient ischemic attack) 12/19/2017  . Hip flexor tendinitis 05/22/2017  . Pain of both hip joints 04/25/2017  . Dyslipidemia 09/01/2015  . BPH without urinary obstruction 09/01/2015  . DM (diabetes mellitus), type 2, uncontrolled, periph vascular complic (HCC) 07/10/2014  . Essential hypertension 04/20/2009  . Cardiomyopathy, ischemic 04/20/2009  . LEFT BUNDLE BRANCH BLOCK 04/20/2009  . Chronic systolic heart failure (HCC) 04/20/2009    Lafreda Casebeer 07/25/2018, 2:46 PM  [facility] [address] [place], [place], [address] Phone: [phone]   Fax:  [phone]  Name: COLIN ELLERS MRN: [id number] Date of Birth: Jan 04, 1956

## 2018-07-26 ENCOUNTER — Ambulatory Visit: Payer: Managed Care, Other (non HMO) | Admitting: *Deleted

## 2018-07-26 ENCOUNTER — Ambulatory Visit: Payer: Managed Care, Other (non HMO) | Admitting: Occupational Therapy

## 2018-07-26 DIAGNOSIS — G459 Transient cerebral ischemic attack, unspecified: Secondary | ICD-10-CM | POA: Diagnosis not present

## 2018-07-26 DIAGNOSIS — Z5181 Encounter for therapeutic drug level monitoring: Secondary | ICD-10-CM | POA: Diagnosis not present

## 2018-07-26 DIAGNOSIS — Z8673 Personal history of transient ischemic attack (TIA), and cerebral infarction without residual deficits: Secondary | ICD-10-CM | POA: Diagnosis not present

## 2018-07-26 LAB — PROTIME-INR
INR: 7.7 — AB (ref 0.8–1.2)
PROTHROMBIN TIME: 72.4 s — AB (ref 9.1–12.0)

## 2018-07-26 LAB — POCT INR: INR: 6.2 — AB (ref 2.0–3.0)

## 2018-07-26 NOTE — Patient Instructions (Signed)
Description   Spoke with Karl [name]-pt's dtr and instructed pt not not take any Coumadin today, No Coumadin Saturday, No Coumadin Sunday, and No Coumadin Monday then start taking 1 tablet daily except 1.5 tablets on Tuesday and Saturday. Recheck INR on Friday.  Report to ER with any bleeding, falls, accidents. Call the Coumadin clinic with any questions #[phone].

## 2018-07-29 ENCOUNTER — Ambulatory Visit: Payer: Managed Care, Other (non HMO) | Admitting: Occupational Therapy

## 2018-07-29 ENCOUNTER — Other Ambulatory Visit: Payer: Self-pay

## 2018-07-29 ENCOUNTER — Ambulatory Visit: Payer: Managed Care, Other (non HMO) | Admitting: Physical Therapy

## 2018-07-29 DIAGNOSIS — M6281 Muscle weakness (generalized): Secondary | ICD-10-CM

## 2018-07-29 DIAGNOSIS — I69354 Hemiplegia and hemiparesis following cerebral infarction affecting left non-dominant side: Secondary | ICD-10-CM

## 2018-07-29 DIAGNOSIS — R2681 Unsteadiness on feet: Secondary | ICD-10-CM

## 2018-07-29 DIAGNOSIS — R278 Other lack of coordination: Secondary | ICD-10-CM

## 2018-07-29 DIAGNOSIS — R2689 Other abnormalities of gait and mobility: Secondary | ICD-10-CM

## 2018-07-29 DIAGNOSIS — R293 Abnormal posture: Secondary | ICD-10-CM

## 2018-07-29 DIAGNOSIS — R41842 Visuospatial deficit: Secondary | ICD-10-CM

## 2018-07-29 NOTE — Therapy (Signed)
[facility] [address] [place], [place], [address] Phone: [phone]   Fax:  [phone]  Occupational Therapy Treatment  Patient Details  Name: Karl [name] MRN: [id number] Date of Birth: 1956-01-05 Referring Provider: Dr. Riley Kill   Encounter Date: 07/29/2018  OT End of Session - 07/29/18 1409    Visit Number  5    Number of Visits  17    Date for OT Re-Evaluation  09/08/18    Authorization Type  [employer]    Authorization - Visit Number  4    Authorization - Number of Visits  30    OT Start Time  1325    OT Stop Time  1405    OT Time Calculation (min)  40 min    Activity Tolerance  Patient tolerated treatment well    Behavior During Therapy  [facility] for tasks assessed/performed       Past Medical History:  Diagnosis Date  . Acute CVA (cerebrovascular accident) (HCC) 06/14/2018  . Coronary artery disease    blockage   Stent Dr. Ladona Ridgel 15 years 1998  . Dyspnea   . Hypertension   . Stroke [facility]) 2019   denies residual on 06/14/2018  . Type II diabetes mellitus (HCC) 07/2014 dx    Past Surgical History:  Procedure Laterality Date  . CORONARY ANGIOPLASTY WITH STENT PLACEMENT  1998  . FRACTURE SURGERY    . GREEN LIGHT LASER TURP (TRANSURETHRAL RESECTION OF PROSTATE N/A 06/05/2018   Procedure: GREEN LIGHT LASER TURP , TRANSURETHRAL RESECTION OF PROSTATE;  Surgeon: Crista Elliot, MD;  Location: [facility];  Service: Urology;  Laterality: N/A;  . TIBIA FRACTURE SURGERY Left 1980s    There were no vitals filed for this visit.  Subjective Assessment - 07/29/18 1409    Pertinent History  CVA 06/14/18, T2DM, CAD with ICM on chronic coumadin, prior CVA 12/2017, TURP 06/05/18 for BPH    Patient Stated Goals  regain independence    Currently in Pain?  No/denies        Treatment:  NEURO RE-ED:  Supine: bilateral shoulder flexion using beach ball x 10 reps w/ cues to use both arms equally and line on ball used as visual  cue. Followed by same activity with weighted ball (yellow) Seated: bilateral sh flexion w/ beach ball, followed by weighted ball but only to midrange d/t compensations and fatigue. Pt cued for posturing THERAPEUTIC ACT:  Pt assembling 12 pc puzzle with mod questioning cues/prompts and min v.c's to establish strategy, problem solve, but pt able to id correct pcs and orient puzzle pc correctly. Pt required mod to max v.c's to use Lt hand to assist w/ max difficulty. Pt assembled 2nd 12 pc puzzle w/ less cueing required.  Flipping cards over Lt hand and separating into suites for coordination, attention to Lt side, and cognitive component. Pt doing with mod to max difficulty using Lt hand. Also question some motor apraxia Lt hand.                       OT Short Term Goals - 07/10/18 1842      OT SHORT TERM GOAL #1   Title  I with inial HEP    Time  4    Period  Weeks    Status  New    Target Date  07/14/18      OT SHORT TERM GOAL #2   Title  Pt will perform all basic  ADLs modified independently.    Time  4    Period  Weeks    Status  New      OT SHORT TERM GOAL #3   Title  Pt will demonstrate improved fine motor coordination for ADLS as evidenced by decreasing 9 hole peg test score to 75 secs or less.    Time  4    Period  Weeks    Status  New      OT SHORT TERM GOAL #4   Title  Pt will perfrom snack prep/ basic home management with supervision.    Time  4    Period  Weeks    Status  New      OT SHORT TERM GOAL #5   Title  Pt will verbalize understanding of compensatory strategiues for short term memory deficits.    Time  4    Period  Weeks    Status  New      Additional Short Term Goals   Additional Short Term Goals  Yes      OT SHORT TERM GOAL #6   Title  Further assess vision and cognition and set goals prn    Time  4    Period  Weeks    Status  New        OT Long Term Goals - 07/10/18 1850      OT LONG TERM GOAL #1   Title  I with updated  HEP.    Time  8    Period  Weeks    Status  New    Target Date  09/08/18      OT LONG TERM GOAL #2   Title  Pt will perform basic home managmeent and cooking at a modified independent level demonstrating good safety awareness.    Time  8    Period  Weeks    Status  New      OT LONG TERM GOAL #3   Title  Pt will navigate a busy environment and locate items with 90% or better accuracy.    Time  8    Period  Weeks    Status  New      OT LONG TERM GOAL #4   Title  Pt will demonstrate improved fine motor coordination for ADLs as evidenced by decreasing 9 hole peg test score to 60 secs or less.    Time  8    Period  Weeks    Status  New      OT LONG TERM GOAL #5   Title  Pt will demonstrate improved LUE strength as evidenced by retrieving a 3 lbs item from overhead shelf at 130 without drops.    Time  8    Period  Weeks    Status  New            Plan - 07/29/18 1410    Clinical Impression Statement  Pt is progressing towards goals. Pt with decreased awareness of Lt hand, and therefore needs to compensate with vision.     Occupational Profile and client history currently impacting functional performance   Pt was completely I and working full time prior to CVA, currently he is unable to work. CVA 06/14/18,Type II DM, CAD with ICM on chronic coumadin, prior CVA 12/2017, TURP 06/05/18 for BPH     Occupational performance deficits (Please refer to evaluation for details):  ADL's;IADL's;Work;Leisure;Social Participation    Rehab Potential  Good    Current Impairments/barriers affecting  progress:  cognitive and visual perceptual deficits, decreased balance    OT Frequency  --   16 visits   OT Duration  12 weeks    OT Treatment/Interventions  Self-care/ADL training;Paraffin;Therapeutic exercise;DME and/or AE instruction;Functional Mobility Training;Cognitive remediation/compensation;Balance training;Manual Therapy;Neuromuscular education;Gait Training;Fluidtherapy;Ultrasound;Aquatic  Therapy;Moist Heat;Contrast Bath;Energy conservation;Passive range of motion;Therapeutic activities;Patient/family education    Plan  continue to address LUE neuro re-ed, visual perceptual skills, attention to Lt hand/side    Consulted and Agree with Plan of Care  Patient;Family member/caregiver    Family Member Consulted  dtr       Patient will benefit from skilled therapeutic intervention in order to improve the following deficits and impairments:  Abnormal gait, Decreased balance, Decreased endurance, Decreased mobility, Impaired vision/preception, Impaired perceived functional ability, Decreased range of motion, Decreased knowledge of precautions, Decreased cognition, Decreased activity tolerance, Decreased coordination, Decreased knowledge of use of DME, Decreased safety awareness, Decreased strength, Impaired UE functional use  Visit Diagnosis: Muscle weakness (generalized)  Visuospatial deficit  Other lack of coordination    Problem List Patient Active Problem List   Diagnosis Date Noted  . 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 Chronic anticoagulation   . Acute ischemic right PCA stroke (HCC) 06/18/2018  . CVA (cerebral vascular accident) (HCC) 06/14/2018  . Urinary retention 06/05/2018  . Encounter for therapeutic drug monitoring 02/08/2018  . History of CVA (cerebrovascular accident) 12/20/2017  . Coronary artery disease 12/19/2017  . Mild renal insufficiency 12/19/2017  . TIA (transient ischemic attack) 12/19/2017  . Hip flexor tendinitis 05/22/2017  . Pain of both hip joints 04/25/2017  . Dyslipidemia 09/01/2015  . BPH without urinary obstruction 09/01/2015  . DM (diabetes mellitus), type 2, uncontrolled, periph vascular complic (HCC) 07/10/2014  . Essential hypertension 04/20/2009  . Cardiomyopathy, ischemic 04/20/2009  . LEFT BUNDLE BRANCH BLOCK 04/20/2009  . Chronic systolic heart failure (HCC) 04/20/2009    Kelli Churn, OTR/L 07/29/2018, 2:12  PM  [place] [facility] [address] [place], [place], [address] Phone: [phone]   Fax:  [phone]  Name: Karl [name] MRN: [id number] Date of Birth: October 05, 1956

## 2018-07-30 NOTE — Therapy (Signed)
[place] [address] [place], [place], [address] Phone: [phone]   Fax:  [phone]  Physical Therapy Evaluation  Patient Details  Name: Karl [name] MRN: [id number] Date of Birth: Aug 23, 1956 Referring Provider: Alger Simons, MD   Encounter Date: 07/29/2018  PT End of Session - 07/29/18 2101    Visit Number  1    Number of Visits  30    Date for PT Re-Evaluation  11/15/18    Authorization Type  [employer] [id number] oop met, 100% coverage; VL PT & speech 30 visit limit hard max zero used before eval.    PT Start Time  1403    PT Stop Time  1446    PT Time Calculation (min)  43 min    Equipment Utilized During Treatment  Gait belt    Activity Tolerance  Patient tolerated treatment well;Patient limited by fatigue    Behavior During Therapy  WFL for tasks assessed/performed       Past Medical History:  Diagnosis Date  . Acute CVA (cerebrovascular accident) ([place]) 06/14/2018  . Coronary artery disease    blockage   Stent Dr. Lovena Le 15 years 1998  . Dyspnea   . Hypertension   . Stroke [facility]) 2019   denies residual on 06/14/2018  . Type II diabetes mellitus ([place]) 07/2014 dx    Past Surgical History:  Procedure Laterality Date  . CORONARY ANGIOPLASTY WITH STENT PLACEMENT  1998  . FRACTURE SURGERY    . GREEN LIGHT LASER TURP (TRANSURETHRAL RESECTION OF PROSTATE N/A 06/05/2018   Procedure: GREEN LIGHT LASER TURP , TRANSURETHRAL RESECTION OF PROSTATE;  Surgeon: [name], MD;  Location: [facility];  Service: Urology;  Laterality: N/A;  . TIBIA FRACTURE SURGERY Left 1980s    There were no vitals filed for this visit.   Subjective Assessment - 07/29/18 1406    Subjective  This 62yo male was referred on 07/08/2018 for Outpatient PT & OT for bilateral CVA by Alger Simons, MD. He had acute right PCA CVA 06/14/2018 with history of previous CVA 12/2017. Inpatient Rehab 06/18/2018 - 07/09/2018.     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  No/denies         [facility] PT Assessment - 07/29/18 1400      Assessment   Medical Diagnosis  CVAs    Referring Provider  Alger Simons, MD    Onset Date/Surgical Date  06/14/18   2nd CVA   Hand Dominance  Right    Prior Therapy  inpatient rehab 06/18/2018 - 07/09/2018      Precautions   Precautions  Fall   no lifting over 25#     Balance Screen   Has the patient fallen in the past 6 months  No    Has the patient had a decrease in activity level because of a fear of falling?   Yes    Is the patient reluctant to leave their home because of a fear of falling?   No   uses w/c mainly     [place]  Children    Type of Home  House    Home Access  Stairs to enter    Entrance Stairs-Number of Steps  1   1 step to porch & 3-4" threshold at door   Entrance Stairs-Rails  None    Home Layout  One level    [place] - 2 wheels;Bedside commode;Tub bench;Wheelchair - manual   using BSC as raised toilet seat     Prior Function   Level of Independence  Independent;Independent with household mobility without device;Independent with community mobility without device    Vocation  Full time employment    Vocation Requirements  lifting 20-25#, stand on feet 8 hrs shift, hand grip    Leisure  fishing from IT trainer, going to Careers adviser Movements are Fluid and Coordinated  No    Fine Motor Movements are Fluid and Coordinated  No    Coordination and Movement Description  Decreased smoothness and accuracy with Lt LE movements in standing.     Heel Shin Test   Left LLE slowed but able to stay on target      Posture/Postural Control   Posture/Postural Control  Postural limitations    Postural Limitations  Rounded Shoulders;Forward head;Flexed trunk;Weight shift right      Tone   Assessment Location  Left Lower Extremity;Right Lower Extremity      ROM / Strength   AROM / PROM / Strength  AROM;Strength      AROM   Overall AROM   Deficits    Overall AROM Comments  BLE hips & knees WFL, ankle dorsiflexion to 90*       Strength   Overall Strength  Deficits    Strength Assessment Site  Hip;Knee;Ankle    Right/Left Hip  Right;Left    Right Hip Flexion  5/5    Right Hip Extension  4/5   tested in standing with BUE support   Right Hip ABduction  4/5   tested in standing with BUE support   Left Hip Flexion  4/5    Left Hip Extension  3/5   tested in standing with BUE support   Left Hip ABduction  3/5   tested in standing with BUE support   Right/Left Knee  Right;Left    Right Knee Flexion  4/5   tested in standing with BUE support   Right Knee Extension  5/5    Left Knee Flexion  3-/5   tested in standing with BUE support   Left Knee Extension  4/5    Right/Left Ankle  Right;Left    Right Ankle Dorsiflexion  4/5    Left Ankle Dorsiflexion  3+/5      Transfers   Transfers  Sit to Stand;Stand to Sit    Sit to Stand  5: Supervision;With upper extremity assist;With armrests;From chair/3-in-1   slowed, stabilizes with RW or back of legs against mat   Stand to Sit  5: Supervision;With upper extremity assist;With armrests;To chair/3-in-1   slowed, stabilizes with RW or back of legs against mat     Ambulation/Gait   Ambulation/Gait  Yes    Ambulation/Gait Assistance  5: Supervision;4: Min assist;3: Mod assist   RW supervision focused straight, MinA scan; cane/HHA ModA   Ambulation Distance (Feet)  150 Feet    Assistive device  Rolling walker;Straight cane;1 person hand held assist    Gait Pattern  Step-through pattern;Decreased step  length - right;Decreased  stance time - left;Decreased hip/knee flexion - left;Decreased weight shift to left;Left hip hike;Left steppage;Ataxic;Antalgic;Lateral hip instability;Trunk flexed;Abducted - left;Poor foot clearance - left    Ambulation Surface  Indoor;Level    Gait velocity  with RW: 0.98 ft/sec comfortable & 1.38 ft/sec fast pace minA    Stairs  Yes    Stairs Assistance  4: Min assist    Stairs Assistance Details (indicate cue type and reason)  slow, dyscoordinated LLE & LUE movements    Stair Management Technique  Two rails;Step to pattern;Forwards    Number of Stairs  4      Standardized Balance Assessment   Standardized Balance Assessment  Berg Balance Test;Timed Up and Go Test;Dynamic Gait Index      Berg Balance Test   Sit to Stand  Needs minimal aid to stand or to stabilize    Standing Unsupported  Able to stand 30 seconds unsupported    Sitting with Back Unsupported but Feet Supported on Floor or Stool  Able to sit safely and securely 2 minutes    Stand to Sit  Uses backs of legs against chair to control descent    Transfers  Able to transfer safely, definite need of hands    Standing Unsupported with Eyes Closed  Able to stand 10 seconds with supervision    Standing Ubsupported with Feet Together  Needs help to attain position but able to stand for 30 seconds with feet together    From Standing, Reach Forward with Outstretched Arm  Reaches forward but needs supervision    From Standing Position, Pick up Object from Floor  Able to pick up shoe, needs supervision    From Standing Position, Turn to Look Behind Over each Shoulder  Needs supervision when turning    Turn 360 Degrees  Needs assistance while turning    Standing Unsupported, Alternately Place Feet on Step/Stool  Needs assistance to keep from falling or unable to try    Standing Unsupported, One Foot in [employer] balance while stepping or standing    Standing on One Leg  Unable to try or needs assist to prevent  fall    Total Score  21      Dynamic Gait Index   Level Surface  Moderate Impairment    Change in Gait Speed  Moderate Impairment    Gait with Horizontal Head Turns  Severe Impairment    Gait with Vertical Head Turns  Moderate Impairment    Gait and Pivot Turn  Severe Impairment    Step Over Obstacle  Severe Impairment    Step Around Obstacles  Severe Impairment    Steps  Severe Impairment    Total Score  3    DGI comment:  RW gait      Timed Up and Go Test   Normal TUG (seconds)  43.66   RW     RLE Tone   RLE Tone  Within Functional Limits      LLE Tone   LLE Tone  Within Functional Limits                Objective measurements completed on examination: See above findings.                  PT Short Term Goals - 07/29/18 2123      PT SHORT TERM GOAL #1   Title  Patient demonstrates understanding of initial HEP. (All STGs Target Date: 08/30/2018)    Time  1  Period  Months    Status  New    Target Date  08/30/18      PT SHORT TERM GOAL #2   Title  Patient ambulates 300' with RW with supervision & scans environment with loss of balance.     Time  1    Period  Months    Status  New    Target Date  08/30/18      PT SHORT TERM GOAL #3   Title  Patient negotiates ramps & curbs with RW and stairs with 1 rail & cane with min guard.     Time  1    Period  Months    Status  New    Target Date  08/30/18      PT SHORT TERM GOAL #4   Title  Patient performs standing balance with RW support: reaching 10" anteriorly & to floor, looks over shoulders with supervision.     Time  1    Period  Months    Status  New    Target Date  08/30/18       PT Long Term Goals - 07/29/18 2117      PT LONG TERM GOAL #1   Title  Patient demonstrates & verbalizes ongoing HEP / fitness plan with medical issues. (All LTGs Target Date: 11/15/2018)    Time  15    Period  Weeks    Status  New    Target Date  11/15/18      PT LONG TERM GOAL #2   Title  Berg Balance  >45/56 to indicate lower fall risk.     Time  15    Period  Weeks    Status  New    Target Date  11/15/18      PT LONG TERM GOAL #3   Title  Timed Up & Go with cane or less <30 sec safely.    Time  15    Period  Weeks    Status  New    Target Date  11/15/18      PT LONG TERM GOAL #4   Title  Dynamic Gait Index with cane >12/24 to indicate lower fall risk.     Time  15    Period  Weeks    Status  New    Target Date  11/15/18      PT LONG TERM GOAL #5   Title  Patient ambulates 400' with cane modified independent for community mobility.     Time  15    Period  Weeks    Status  New    Target Date  11/15/18      PT LONG TERM GOAL #6   Title  Patient negotiates ramps, curbs & stairs single rail with cane modified independent for community access.    Time  15    Period  Weeks    Status  New    Target Date  11/15/18                 Plan - 07/29/18 2104    Clinical Impression Statement  This 62yo male had left CVA Jan 2019 with mild residual right side weakness and right CVA 06/14/2018 with left hemiparesis of UE & LE. Pateint has impaired balance with high fall risk as indicated by [employer] 21/56 and Timed Up-Go 43.66 with rolling walker. Dynamic Gait Index with RW 3/24 and gait velocity 0.98 ft/sec indicate fall risk with gait. PT assessed gait  with cane & hand hold moderate assist with decreased LLE weight acceptance, stance stability & clearance in swing. Patient appears would benefit from skilled PT to improve function & safety.     History and Personal Factors relevant to plan of care:  Patient lives with daughter. 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     Clinical Presentation  Evolving    Clinical Presentation due to:  2nd CVA in 6 months so not fully recovered from 1st, HTN, high fall risk,     Clinical Decision Making  Moderate    Rehab Potential  Good    Clinical Impairments Affecting Rehab Potential  Right CVA  06/14/2018, Left CVA Jan 2019, CAD, CABG, cardiomyopathy, HTN, tibia fx with surgery, DM2,     PT Frequency  2x / week    PT Duration  Other (comment)   15 weeks   PT Treatment/Interventions  ADLs/Self Care Home Management;Canalith Repostioning;DME Instruction;Gait training;Stair training;Functional mobility training;Therapeutic activities;Therapeutic exercise;Balance training;Neuromuscular re-education;Patient/family education;Orthotic Fit/Training;Manual techniques;Vestibular    PT Next Visit Plan  HEP for balance & strength    Consulted and Agree with Plan of Care  Patient;Family member/caregiver    Family Member Consulted  dtr, Karl [name]       Patient will benefit from skilled therapeutic intervention in order to improve the following deficits and impairments:  Abnormal gait, Decreased activity tolerance, Decreased balance, Decreased coordination, Decreased endurance, Decreased knowledge of use of DME, Decreased mobility, Decreased strength, Dizziness, Impaired flexibility, Impaired sensation, Postural dysfunction  Visit Diagnosis: Muscle weakness (generalized)  Other lack of coordination  Other abnormalities of gait and mobility  Hemiplegia and hemiparesis following cerebral infarction affecting left non-dominant side (HCC)  Unsteadiness on feet  Abnormal posture     Problem List Patient Active Problem List   Diagnosis Date Noted  . 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 Chronic anticoagulation   . Acute ischemic right PCA stroke ([place]) 06/18/2018  . CVA (cerebral vascular accident) ([place]) 06/14/2018  . Urinary retention 06/05/2018  . Encounter for therapeutic drug monitoring 02/08/2018  . History of CVA (cerebrovascular accident) 12/20/2017  . Coronary artery disease 12/19/2017  . Mild renal insufficiency 12/19/2017  . TIA (transient ischemic attack) 12/19/2017  . Hip flexor tendinitis 05/22/2017  . Pain of both hip joints 04/25/2017  . Dyslipidemia 09/01/2015  . BPH  without urinary obstruction 09/01/2015  . DM (diabetes mellitus), type 2, uncontrolled, periph vascular complic ([place]) 71/85/5015  . Essential hypertension 04/20/2009  . Cardiomyopathy, ischemic 04/20/2009  . LEFT BUNDLE BRANCH BLOCK 04/20/2009  . Chronic systolic heart failure ([place]) 04/20/2009    Heath Badon PT, DPT 07/30/2018, 9:17 PM  [place] [address] [place], [place], [address] Phone: [phone]   Fax:  [phone]  Name: WOLFGANG FINIGAN MRN: [id number] Date of Birth: 1956-03-29

## 2018-08-01 ENCOUNTER — Ambulatory Visit: Payer: Managed Care, Other (non HMO) | Admitting: Physical Therapy

## 2018-08-01 ENCOUNTER — Encounter: Payer: Self-pay | Admitting: Physical Therapy

## 2018-08-01 DIAGNOSIS — R2681 Unsteadiness on feet: Secondary | ICD-10-CM

## 2018-08-01 DIAGNOSIS — R293 Abnormal posture: Secondary | ICD-10-CM

## 2018-08-01 DIAGNOSIS — M6281 Muscle weakness (generalized): Secondary | ICD-10-CM

## 2018-08-01 DIAGNOSIS — I69354 Hemiplegia and hemiparesis following cerebral infarction affecting left non-dominant side: Secondary | ICD-10-CM

## 2018-08-01 DIAGNOSIS — R278 Other lack of coordination: Secondary | ICD-10-CM

## 2018-08-01 DIAGNOSIS — R2689 Other abnormalities of gait and mobility: Secondary | ICD-10-CM

## 2018-08-01 NOTE — Therapy (Signed)
[facility] [address] [place], [place], [address] Phone: [phone]   Fax:  [phone]  Physical Therapy Treatment  Patient Details  Name: Karl [name] MRN: [id number] Date of Birth: Sep 07, 1956 Referring Provider: Alger Simons, MD   Encounter Date: 08/01/2018  PT End of Session - 08/01/18 0936    Visit Number  2    Number of Visits  30    Date for PT Re-Evaluation  11/15/18    Authorization Type  [employer] [id number] oop met, 100% coverage; VL PT & speech 30 visit limit hard max zero used before eval.    PT Start Time  0934    PT Stop Time  1015    PT Time Calculation (min)  41 min    Equipment Utilized During Treatment  Gait belt    Activity Tolerance  Patient tolerated treatment well;Patient limited by fatigue    Behavior During Therapy  [facility] for tasks assessed/performed       Past Medical History:  Diagnosis Date  . Acute CVA (cerebrovascular accident) ([place]) 06/14/2018  . Coronary artery disease    blockage   Stent Dr. Lovena Le 15 years 1998  . Dyspnea   . Hypertension   . Stroke [facility]) 2019   denies residual on 06/14/2018  . Type II diabetes mellitus ([place]) 07/2014 dx    Past Surgical History:  Procedure Laterality Date  . CORONARY ANGIOPLASTY WITH STENT PLACEMENT  1998  . FRACTURE SURGERY    . GREEN LIGHT LASER TURP (TRANSURETHRAL RESECTION OF PROSTATE N/A 06/05/2018   Procedure: GREEN LIGHT LASER TURP , TRANSURETHRAL RESECTION OF PROSTATE;  Surgeon: [name], MD;  Location: [facility];  Service: Urology;  Laterality: N/A;  . TIBIA FRACTURE SURGERY Left 1980s    There were no vitals filed for this visit.  Subjective Assessment - 08/01/18 0935    Subjective  No new complaints. No falls or pain to report.     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  No/denies    Pain Score  0-No pain           [facility] Adult PT Treatment/Exercise - 08/01/18 1014      Transfers   Transfers  Sit to Stand;Stand to Sit    Sit to Stand  5: Supervision;With upper extremity assist;With armrests;From chair/3-in-1    Sit to Stand Details  Verbal cues for precautions/safety;Verbal cues for sequencing;Verbal cues for safe use of DME/AE    Sit to Stand Details (indicate cue type and reason)  cues for hand placement for safety (not to use RW) and for anterior weight shifting. pt able to stand a few times without UE support to stabilize, otherwise needed chair back/RW/counter support to stabilize    Stand to Sit  5: Supervision;With upper extremity assist;With armrests;To chair/3-in-1    Stand to Sit Details (indicate cue type and reason)  Verbal cues for sequencing;Verbal cues for precautions/safety;Verbal cues for safe use of DME/AE    Stand to Sit Details  cues to reach back and use arms to controll descent with sitting down.       Ambulation/Gait   Ambulation/Gait  Yes    Ambulation/Gait Assistance  4: Min guard;5: Supervision    Ambulation/Gait Assistance Details  cues on posture, step length and weight shifting. min guard with cues for negotiation around obstacles    Ambulation Distance (Feet)  50 Feet   x2   Assistive device  Rolling walker    Gait Pattern  Step-through pattern;Decreased step length - right;Decreased stance time - left;Decreased hip/knee flexion - left;Decreased weight shift to left;Left hip hike;Left steppage;Ataxic;Antalgic;Lateral hip instability;Trunk flexed;Abducted - left;Poor foot clearance - left    Ambulation Surface  Level;Indoor      Issued the following to pt's HEP today. Min guard assist for balance with cues on ex form/technique.    Access Code: [email or web address]  URL:  [email or web address]  Date: 08/01/2018  Prepared by: Willow Ora   Exercises  Single Leg Bridge - 10 reps - 1 sets - 5 hold - 1x daily - 5x weekly  Sit to/from Stand in Stride position - 10 reps - 1 sets - 1x daily - 5x weekly  Standing Hip Abduction with Counter Support - 10 reps - 2 sets - 1x daily - 5x weekly  Mini Squat with Counter Support - 10 reps - 1 sets - 3 hold - 1x daily - 5x weekly  Wide Stance with Unilateral Counter Support - 2-3 reps - 1 sets - 30 hold - 1x daily - 5x weekly  Wide Stance with Head Rotations and Counter Support - 10 reps - 1 sets - 1x daily - 5x weekly      PT Education - 08/01/18 1346    Education Details  HEP for strengthening and balance    Person(s) Educated  Patient;Child(ren)    Methods  Explanation;Demonstration;Tactile cues;Verbal cues;Handout    Comprehension  Verbalized understanding;Returned demonstration;Verbal cues required;Tactile cues required;Need further instruction       PT Short Term Goals - 07/29/18 2123      PT SHORT TERM GOAL #1   Title  Patient demonstrates understanding of initial HEP. (All STGs Target Date: 08/30/2018)    Time  1    Period  Months    Status  New    Target Date  08/30/18      PT SHORT TERM GOAL #2   Title  Patient ambulates 300' with RW with supervision & scans environment with loss of balance.     Time  1    Period  Months    Status  New    Target Date  08/30/18      PT SHORT TERM GOAL #3   Title  Patient negotiates ramps & curbs with RW and stairs with 1 rail & cane with min guard.     Time  1    Period  Months    Status  New    Target Date  08/30/18      PT SHORT TERM GOAL #4   Title  Patient performs standing balance with RW support: reaching 10" anteriorly & to floor, looks over shoulders with supervision.     Time  1    Period  Months    Status  New    Target Date  08/30/18        PT Long Term Goals - 07/29/18 2117      PT LONG TERM GOAL #1   Title  Patient  demonstrates & verbalizes ongoing HEP / fitness plan with medical issues. (All LTGs Target Date: 11/15/2018)    Time  15    Period  Weeks  Status  New    Target Date  11/15/18      PT LONG TERM GOAL #2   Title  Berg Balance >45/56 to indicate lower fall risk.     Time  15    Period  Weeks    Status  New    Target Date  11/15/18      PT LONG TERM GOAL #3   Title  Timed Up & Go with cane or less <30 sec safely.    Time  15    Period  Weeks    Status  New    Target Date  11/15/18      PT LONG TERM GOAL #4   Title  Dynamic Gait Index with cane >12/24 to indicate lower fall risk.     Time  15    Period  Weeks    Status  New    Target Date  11/15/18      PT LONG TERM GOAL #5   Title  Patient ambulates 400' with cane modified independent for community mobility.     Time  15    Period  Weeks    Status  New    Target Date  11/15/18      PT LONG TERM GOAL #6   Title  Patient negotiates ramps, curbs & stairs single rail with cane modified independent for community access.    Time  15    Period  Weeks    Status  New    Target Date  11/15/18            Plan - 08/01/18 0936    Clinical Impression Statement  Today's skilled session focused on establishment of an HEP to address strengthening and balance. Also continued to address gait with RW and transfer safety. Pt's daughter present and education on HEP for home as well. Pt is progressing toward goals and should benefit from continued PT to progress toward unmet goals.     Rehab Potential  Good    Clinical Impairments Affecting Rehab Potential  Right CVA 06/14/2018, Left CVA Jan 2019, CAD, CABG, cardiomyopathy, HTN, tibia fx with surgery, DM2,     PT Frequency  2x / week    PT Duration  Other (comment)   15 weeks   PT Treatment/Interventions  ADLs/Self Care Home Management;Canalith Repostioning;DME Instruction;Gait training;Stair training;Functional mobility training;Therapeutic activities;Therapeutic exercise;Balance  training;Neuromuscular re-education;Patient/family education;Orthotic Fit/Training;Manual techniques;Vestibular    PT Next Visit Plan  continue to work on balance- emphasis on decreased UE support, left LE strengthening, gait with RW vs cane    Consulted and Agree with Plan of Care  Patient;Family member/caregiver    Family Member Consulted  dtr, [name]       Patient will benefit from skilled therapeutic intervention in order to improve the following deficits and impairments:  Abnormal gait, Decreased activity tolerance, Decreased balance, Decreased coordination, Decreased endurance, Decreased knowledge of use of DME, Decreased mobility, Decreased strength, Dizziness, Impaired flexibility, Impaired sensation, Postural dysfunction  Visit Diagnosis: Muscle weakness (generalized)  Other abnormalities of gait and mobility  Hemiplegia and hemiparesis following cerebral infarction affecting left non-dominant side (HCC)  Unsteadiness on feet  Abnormal posture  Other lack of coordination     Problem List Patient Active Problem List   Diagnosis Date Noted  . 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 Chronic anticoagulation   . Acute ischemic right PCA stroke ([place]) 06/18/2018  . CVA (cerebral vascular accident) ([place]) 06/14/2018  . Urinary retention 06/05/2018  .  Encounter for therapeutic drug monitoring 02/08/2018  . History of CVA (cerebrovascular accident) 12/20/2017  . Coronary artery disease 12/19/2017  . Mild renal insufficiency 12/19/2017  . TIA (transient ischemic attack) 12/19/2017  . Hip flexor tendinitis 05/22/2017  . Pain of both hip joints 04/25/2017  . Dyslipidemia 09/01/2015  . BPH without urinary obstruction 09/01/2015  . DM (diabetes mellitus), type 2, uncontrolled, periph vascular complic ([place]) 69/86/1483  . Essential hypertension 04/20/2009  . Cardiomyopathy, ischemic 04/20/2009  . LEFT BUNDLE BRANCH BLOCK 04/20/2009  . Chronic systolic heart failure ([place])  04/20/2009    Willow Ora, PTA, [facility] [address], [place], [place] [address] [phone] 08/01/18, 1:52 PM   Name: Karl [name] MRN: [id number] Date of Birth: Nov 14, 1956

## 2018-08-02 ENCOUNTER — Ambulatory Visit (INDEPENDENT_AMBULATORY_CARE_PROVIDER_SITE_OTHER): Payer: Managed Care, Other (non HMO) | Admitting: *Deleted

## 2018-08-02 DIAGNOSIS — G459 Transient cerebral ischemic attack, unspecified: Secondary | ICD-10-CM | POA: Diagnosis not present

## 2018-08-02 DIAGNOSIS — Z8673 Personal history of transient ischemic attack (TIA), and cerebral infarction without residual deficits: Secondary | ICD-10-CM

## 2018-08-02 DIAGNOSIS — Z5181 Encounter for therapeutic drug level monitoring: Secondary | ICD-10-CM

## 2018-08-02 LAB — POCT INR: INR: 1.7 — AB (ref 2.0–3.0)

## 2018-08-02 NOTE — Patient Instructions (Addendum)
Description   Today take 1.5 tablets then start taking 1 tablet daily except 1.5 tablets on Tuesday and Saturday. Recheck INR on 1 week.  Report to ER with any bleeding, falls, accidents. Call the Coumadin clinic with any questions #[phone].

## 2018-08-07 ENCOUNTER — Ambulatory Visit: Payer: Managed Care, Other (non HMO) | Admitting: Physical Therapy

## 2018-08-07 ENCOUNTER — Ambulatory Visit: Payer: Managed Care, Other (non HMO) | Attending: Physical Medicine & Rehabilitation | Admitting: Occupational Therapy

## 2018-08-07 ENCOUNTER — Other Ambulatory Visit: Payer: Self-pay | Admitting: Physical Medicine and Rehabilitation

## 2018-08-07 ENCOUNTER — Encounter: Payer: Self-pay | Admitting: Physical Therapy

## 2018-08-07 DIAGNOSIS — R278 Other lack of coordination: Secondary | ICD-10-CM | POA: Diagnosis present

## 2018-08-07 DIAGNOSIS — R2681 Unsteadiness on feet: Secondary | ICD-10-CM | POA: Insufficient documentation

## 2018-08-07 DIAGNOSIS — M6281 Muscle weakness (generalized): Secondary | ICD-10-CM

## 2018-08-07 DIAGNOSIS — R2689 Other abnormalities of gait and mobility: Secondary | ICD-10-CM

## 2018-08-07 DIAGNOSIS — R293 Abnormal posture: Secondary | ICD-10-CM | POA: Insufficient documentation

## 2018-08-07 DIAGNOSIS — R41842 Visuospatial deficit: Secondary | ICD-10-CM | POA: Insufficient documentation

## 2018-08-07 DIAGNOSIS — I69354 Hemiplegia and hemiparesis following cerebral infarction affecting left non-dominant side: Secondary | ICD-10-CM | POA: Insufficient documentation

## 2018-08-07 NOTE — Therapy (Signed)
[facility] [address] [place], [place], [address] Phone: [phone]   Fax:  [phone]  Occupational Therapy Treatment  Patient Details  Name: Karl [name] MRN: [id number] Date of Birth: 01-Nov-1956 Referring Provider: Faith Rogue, MD   Encounter Date: 08/07/2018  OT End of Session - 08/07/18 1448    Visit Number  6    Number of Visits  17    Date for OT Re-Evaluation  09/08/18    Authorization Type  [employer]    Authorization - Visit Number  5    Authorization - Number of Visits  30    OT Start Time  1020    OT Stop Time  1100    OT Time Calculation (min)  40 min    Activity Tolerance  Patient tolerated treatment well    Behavior During Therapy  [facility] for tasks assessed/performed       Past Medical History:  Diagnosis Date  . Acute CVA (cerebrovascular accident) ([facility]) 06/14/2018  . Coronary artery disease    blockage   Stent Dr. Ladona Ridgel 15 years 1998  . Dyspnea   . Hypertension   . Stroke [facility]) 2019   denies residual on 06/14/2018  . Type II diabetes mellitus ([facility]) 07/2014 dx    Past Surgical History:  Procedure Laterality Date  . CORONARY ANGIOPLASTY WITH STENT PLACEMENT  1998  . FRACTURE SURGERY    . GREEN LIGHT LASER TURP (TRANSURETHRAL RESECTION OF PROSTATE N/A 06/05/2018   Procedure: GREEN LIGHT LASER TURP , TRANSURETHRAL RESECTION OF PROSTATE;  Surgeon: Crista Elliot, MD;  Location: [facility];  Service: Urology;  Laterality: N/A;  . TIBIA FRACTURE SURGERY Left 1980s    There were no vitals filed for this visit.  Subjective Assessment - 08/07/18 1024    Subjective   No reports of pain    Pertinent History  CVA 06/14/18, T2DM, CAD with ICM on chronic coumadin, prior CVA 12/2017, TURP 06/05/18 for BPH    Patient Stated Goals  regain independence    Currently in Pain?  No/denies            Treatment: Ball exercises in supine closed chain for chest press and shoulder flexion followed by mid  range shoulder flex in seated. Reviewed coordination HEP, see pt instructions.               OT Education - 08/07/18 1450    Education Details  Reveiwed ball exercises supine and seated, coordination HEP, handout re-issued    Person(s) Educated  Patient    Methods  Explanation;Demonstration;Verbal cues;Handout    Comprehension  Verbalized understanding;Returned demonstration;Verbal cues required       OT Short Term Goals - 08/07/18 1025      OT SHORT TERM GOAL #1   Title  I with inital HEP    Time  4    Period  Weeks    Status  On-going      OT SHORT TERM GOAL #2   Title  Pt will perform all basic ADLs modified independently.    Status  On-going      OT SHORT TERM GOAL #3   Title  Pt will demonstrate improved fine motor coordination for ADLS as evidenced by decreasing 9 hole peg test score to 75 secs or less.    Status  On-going      OT SHORT TERM GOAL #4   Title  Pt will perfrom snack prep/ basic home management with supervision.  Status  On-going      OT SHORT TERM GOAL #5   Title  Pt will verbalize understanding of compensatory strategiues for short term memory deficits.    Status  On-going      OT SHORT TERM GOAL #6   Title  Further assess vision and cognition and set goals prn    Status  On-going        OT Long Term Goals - 07/10/18 1850      OT LONG TERM GOAL #1   Title  I with updated HEP.    Time  8    Period  Weeks    Status  New    Target Date  09/08/18      OT LONG TERM GOAL #2   Title  Pt will perform basic home managmeent and cooking at a modified independent level demonstrating good safety awareness.    Time  8    Period  Weeks    Status  New      OT LONG TERM GOAL #3   Title  Pt will navigate a busy environment and locate items with 90% or better accuracy.    Time  8    Period  Weeks    Status  New      OT LONG TERM GOAL #4   Title  Pt will demonstrate improved fine motor coordination for ADLs as evidenced by decreasing 9  hole peg test score to 60 secs or less.    Time  8    Period  Weeks    Status  New      OT LONG TERM GOAL #5   Title  Pt will demonstrate improved LUE strength as evidenced by retrieving a 3 lbs item from overhead shelf at 130 without drops.    Time  8    Period  Weeks    Status  New            Plan - 08/07/18 1449    Clinical Impression Statement  Pt is progressing towards goals. Pt demonstrates understanding of intial HEP for coordination and closed chain shoulder flexion.     Occupational Profile and client history currently impacting functional performance   Pt was completely I and working full time prior to CVA, currently he is unable to work. CVA 06/14/18,Type II DM, CAD with ICM on chronic coumadin, prior CVA 12/2017, TURP 06/05/18 for BPH     Occupational performance deficits (Please refer to evaluation for details):  ADL's;IADL's;Work;Leisure;Social Participation    Rehab Potential  Good    Current Impairments/barriers affecting progress:  cognitive and visual perceptual deficits, decreased balance    OT Frequency  --   16 visits   OT Duration  12 weeks    OT Treatment/Interventions  Self-care/ADL training;Paraffin;Therapeutic exercise;DME and/or AE instruction;Functional Mobility Training;Cognitive remediation/compensation;Balance training;Manual Therapy;Neuromuscular education;Gait Training;Fluidtherapy;Ultrasound;Aquatic Therapy;Moist Heat;Contrast Bath;Energy conservation;Passive range of motion;Therapeutic activities;Patient/family education    Plan  continue to address LUE neuro re-ed, visual perceptual skills, attention to Lt hand/side    Consulted and Agree with Plan of Care  Patient       Patient will benefit from skilled therapeutic intervention in order to improve the following deficits and impairments:  Abnormal gait, Decreased balance, Decreased endurance, Decreased mobility, Impaired vision/preception, Impaired perceived functional ability, Decreased range of  motion, Decreased knowledge of precautions, Decreased cognition, Decreased activity tolerance, Decreased coordination, Decreased knowledge of use of DME, Decreased safety awareness, Decreased strength, Impaired UE functional use  Visit Diagnosis: Muscle  weakness (generalized)  Hemiplegia and hemiparesis following cerebral infarction affecting left non-dominant side (HCC)  Abnormal posture  Other lack of coordination  Visuospatial deficit    Problem List Patient Active Problem List   Diagnosis Date Noted  . 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 Chronic anticoagulation   . Acute ischemic right PCA stroke (H[facility]) 06/18/2018  . CVA (cerebral vascular accident) ([facility]) 06/14/2018  . Urinary retention 06/05/2018  . Encounter for therapeutic drug monitoring 02/08/2018  . History of CVA (cerebrovascular accident) 12/20/2017  . Coronary artery disease 12/19/2017  . Mild renal insufficiency 12/19/2017  . TIA (transient ischemic attack) 12/19/2017  . Hip flexor tendinitis 05/22/2017  . Pain of both hip joints 04/25/2017  . Dyslipidemia 09/01/2015  . BPH without urinary obstruction 09/01/2015  . DM (diabetes mellitus), type 2, uncontrolled, periph vascular complic (HCC) 07/10/2014  . Essential hypertension 04/20/2009  . Cardiomyopathy, ischemic 04/20/2009  . LEFT BUNDLE BRANCH BLOCK 04/20/2009  . Chronic systolic heart failure (HCC) 04/20/2009    Hevin Jeffcoat 08/07/2018, 2:57 PM .[facility] [address] [place], [place], [address] Phone: [phone]   Fax:  [phone]  Name: SHADY BRADISH MRN: [id number] Date of Birth: November 19, 1956

## 2018-08-07 NOTE — Patient Instructions (Signed)
  Coordination Activities  Perform the following activities for 20 minutes 1 times per day with left hand(s).   Rotate ball in fingertips (clockwise and counter-clockwise).  Flip cards 1 at a time as fast as you can.  Deal cards with your thumb (Hold deck in hand and push card off top with thumb).  Shuffle cards.  Pick up coins and place in container or coin bank.     Laying on your back hold ball between your hands, raise arms overhead 10 reps 2x day  Seated hold a ball between your hands , raise arms up toshoulder height without elevating shoulder or arching back,then  lower ball back to your lap. Keep elbows straight.   Perform 2 sets of 10 reps 1x day

## 2018-08-07 NOTE — Therapy (Signed)
[facility] [address] [place], [place], [address] Phone: [phone]   Fax:  [phone]  Physical Therapy Treatment  Patient Details  Name: Karl [name] MRN: [id number] Date of Birth: [birth year] Referring Provider: Alger Simons, MD   Encounter Date: 08/07/2018  PT End of Session - 08/07/18 1111    Visit Number  3    Number of Visits  30   hard max limit=30   Date for PT Re-Evaluation  11/15/18    Authorization Type  [employer] $3500 oop met, 100% coverage; VL PT & speech 30 visit limit hard max zero used before eval.    PT Start Time  0932    PT Stop Time  1015    PT Time Calculation (min)  43 min    Equipment Utilized During Treatment  --    Activity Tolerance  Patient tolerated treatment well    Behavior During Therapy  [facility] for tasks assessed/performed       Past Medical History:  Diagnosis Date  . Acute CVA (cerebrovascular accident) ([place]) 06/14/2018  . Coronary artery disease    blockage   Stent Dr. Lovena Le 15 years 1998  . Dyspnea   . Hypertension   . Stroke [facility]) 2019   denies residual on 06/14/2018  . Type II diabetes mellitus ([place]) 07/2014 dx    Past Surgical History:  Procedure Laterality Date  . CORONARY ANGIOPLASTY WITH STENT PLACEMENT  1998  . FRACTURE SURGERY    . GREEN LIGHT LASER TURP (TRANSURETHRAL RESECTION OF PROSTATE N/A 06/05/2018   Procedure: GREEN LIGHT LASER TURP , TRANSURETHRAL RESECTION OF PROSTATE;  Surgeon: [name], MD;  Location: [facility];  Service: Urology;  Laterality: N/A;  . TIBIA FRACTURE SURGERY Left 1980s    There were no vitals filed for this visit.  Subjective Assessment - 08/07/18 0935    Subjective  No changes. No falls. Will start to lose balance and can stop and recover.     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  No/denies         Treatment-  HEP-pt performed 5-10 reps of all exercises from HEP to assure correct technique (required assist with sit to stand, mini-squats, and standing hip abdct). Added bridging with leg extension (left, right, then lower) x 10 reps; seated hamstring/dorsiflexion stretch with strap x 30 sec each x 2 each leg. Also completed standing left knee flexion with min assist progressing to vc's to keep left knee level with right knee while lifting left heel towards buttock x 10 reps. With all standing ex's required vc for lessening UE support and upright posture.  Access Code: [email or web address]  URL: [email or web address]  Date: 08/07/2018  Prepared by: Barry Brunner   Exercises  Single Leg Bridge - 10 reps - 1 sets - 5 hold - 1x daily - 5x weekly  Alternating Single Leg Bridge - 10 reps - 1 sets - 1x daily - 5x weekly  Sit to/from Stand in Stride position - 10 reps - 1 sets - 1x daily - 5x weekly  Standing Hip Abduction with Counter Support - 10 reps - 2 sets - 1x  daily - 5x weekly  Mini Squat with Counter Support - 10 reps - 1 sets - 3 hold - 1x daily - 5x weekly  Wide Stance with Unilateral Counter Support - 2-3 reps - 1 sets - 30 hold - 1x daily - 5x weekly  Wide Stance with Head Rotations and Counter Support - 10 reps - 1 sets - 1x daily - 5x weekly  Seated Gastroc Stretch with Strap - 3 reps - 1 sets - 30 seconds hold - 1x daily - 7x weekly     Transfers/Gait training- supervision with sit to stand to/from RW with vc for safe hand placement, to stagger feet with left foot back, and for slow controlled descent without using UEs. Semi-stand pivot chair to bed without device with minguard assist with safe technique demonstrated. Educated on use of RW with walking x 30 ft x2, x 50 ft with min assist to  minguard and cues for upright posture, maneuvering RW to maintain continuous stepping, and vc for step length.                       PT Education - 08/07/18 1111    Education Details  HEP    Person(s) Educated  Patient    Methods  Explanation;Demonstration;Tactile cues;Verbal cues;Handout    Comprehension  Verbalized understanding;Returned demonstration;Verbal cues required;Tactile cues required;Need further instruction       PT Short Term Goals - 07/29/18 2123      PT SHORT TERM GOAL #1   Title  Patient demonstrates understanding of initial HEP. (All STGs Target Date: 08/30/2018)    Time  1    Period  Months    Status  New    Target Date  08/30/18      PT SHORT TERM GOAL #2   Title  Patient ambulates 300' with RW with supervision & scans environment with loss of balance.     Time  1    Period  Months    Status  New    Target Date  08/30/18      PT SHORT TERM GOAL #3   Title  Patient negotiates ramps & curbs with RW and stairs with 1 rail & cane with min guard.     Time  1    Period  Months    Status  New    Target Date  08/30/18      PT SHORT TERM GOAL #4   Title  Patient performs standing balance with RW support: reaching 10" anteriorly & to floor, looks over shoulders with supervision.     Time  1    Period  Months    Status  New    Target Date  08/30/18        PT Long Term Goals - 07/29/18 2117      PT LONG TERM GOAL #1   Title  Patient demonstrates & verbalizes ongoing HEP / fitness plan with medical issues. (All LTGs Target Date: 11/15/2018)    Time  15    Period  Weeks    Status  New    Target Date  11/15/18      PT LONG TERM GOAL #2   Title  Berg Balance >45/56 to indicate lower fall risk.     Time  15    Period  Weeks    Status  New    Target Date  11/15/18      PT LONG TERM GOAL #3   Title  Timed Up & Go with cane or less <30 sec safely.    Time  15    Period  Weeks    Status  New    Target Date  11/15/18      PT LONG TERM  GOAL #4   Title  Dynamic Gait Index with cane >12/24 to indicate lower fall risk.     Time  15    Period  Weeks    Status  New    Target Date  11/15/18      PT LONG TERM GOAL #5   Title  Patient ambulates 400' with cane modified independent for community mobility.     Time  15    Period  Weeks    Status  New    Target Date  11/15/18      PT LONG TERM GOAL #6   Title  Patient negotiates ramps, curbs & stairs single rail with cane modified independent for community access.    Time  15    Period  Weeks    Status  New    Target Date  11/15/18            Plan - 08/07/18 1110    Clinical Impression Statement  Checked understanding of current HEP (required vc, tactile cues with hip abdct, mini-squats, and sit to stand for correct technique and achieving midline--pt favors incr weight over RLE). Additions to HEP made. Gait training (especially once legs fatigued) with vc and assist to keep RW moving and take consecutive steps. Patient is highly motivated and will continue to progress towards LTGs.     Rehab Potential  Good    Clinical Impairments Affecting Rehab Potential  Right CVA 06/14/2018, Left CVA Jan 2019, CAD, CABG, cardiomyopathy, HTN, tibia fx with surgery, DM2,     PT Frequency  2x / week    PT Duration  Other (comment)   15 weeks   PT Treatment/Interventions  ADLs/Self Care Home Management;Canalith Repostioning;DME Instruction;Gait training;Stair training;Functional mobility training;Therapeutic activities;Therapeutic exercise;Balance training;Neuromuscular re-education;Patient/family education;Orthotic Fit/Training;Manual techniques;Vestibular    PT Next Visit Plan  Continue focus on equal wt-bearing on bil LEs (or biasing to LLE--pt biasing to his RLE); continue to work on balance- emphasis on decreased UE support, left LE strengthening (try leg press for strength and knee control), gait with RW (?eventual progress to cane)    PT Home Exercise Plan  [email or web address]     Consulted  and Agree with Plan of Care  Patient;Family member/caregiver    Family Member Consulted  dtr, [name]       Patient will benefit from skilled therapeutic intervention in order to improve the following deficits and impairments:  Abnormal gait, Decreased activity tolerance, Decreased balance, Decreased coordination, Decreased endurance, Decreased knowledge of use of DME, Decreased mobility, Decreased strength, Dizziness, Impaired flexibility, Impaired sensation, Postural dysfunction  Visit Diagnosis: Muscle weakness (generalized)  Hemiplegia and hemiparesis following cerebral infarction affecting left non-dominant side (HCC)  Other abnormalities of gait and mobility     Problem List Patient Active Problem List   Diagnosis Date Noted  . 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 Chronic anticoagulation   . Acute ischemic right PCA stroke ([place]) 06/18/2018  . CVA (cerebral vascular accident) ([place]) 06/14/2018  . Urinary retention 06/05/2018  . Encounter for therapeutic drug monitoring 02/08/2018  . History of CVA (cerebrovascular accident) 12/20/2017  . Coronary artery disease 12/19/2017  . Mild renal insufficiency 12/19/2017  . TIA (transient ischemic attack) 12/19/2017  .  Hip flexor tendinitis 05/22/2017  . Pain of both hip joints 04/25/2017  . Dyslipidemia 09/01/2015  . BPH without urinary obstruction 09/01/2015  . DM (diabetes mellitus), type 2, uncontrolled, periph vascular complic ([place]) 08/06/148  . Essential hypertension 04/20/2009  . Cardiomyopathy, ischemic 04/20/2009  . LEFT BUNDLE BRANCH BLOCK 04/20/2009  . Chronic systolic heart failure ([place]) 04/20/2009    Jeanie Cooks Malu Pellegrini. PT 08/07/2018, 11:21 AM  [facility] [address] [place], [place], [address] Phone: [phone]   Fax:  [phone]  Name: Karl [name] MRN: [id number] Date of Birth: 31-Aug-1956

## 2018-08-07 NOTE — Patient Instructions (Signed)
Access Code: [email or web address]  URL: [email or web address]  Date: 08/07/2018  Prepared by: Veda Canning   Exercises  Single Leg Bridge - 10 reps - 1 sets - 5 hold - 1x daily - 5x weekly  Alternating Single Leg Bridge - 10 reps - 1 sets - 1x daily - 5x weekly  Sit to/from Stand in Stride position - 10 reps - 1 sets - 1x daily - 5x weekly  Standing Hip Abduction with Counter Support - 10 reps - 2 sets - 1x daily - 5x weekly  Mini Squat with Counter Support - 10 reps - 1 sets - 3 hold - 1x daily - 5x weekly  Wide Stance with Unilateral Counter Support - 2-3 reps - 1 sets - 30 hold - 1x daily - 5x weekly  Wide Stance with Head Rotations and Counter Support - 10 reps - 1 sets - 1x daily - 5x weekly  Seated Gastroc Stretch with Strap - 3 reps - 1 sets - 30 seconds hold - 1x daily - 7x weekly

## 2018-08-08 ENCOUNTER — Ambulatory Visit: Payer: Managed Care, Other (non HMO) | Admitting: Rehabilitative and Restorative Service Providers"

## 2018-08-08 ENCOUNTER — Ambulatory Visit: Payer: Managed Care, Other (non HMO) | Admitting: Occupational Therapy

## 2018-08-09 ENCOUNTER — Other Ambulatory Visit: Payer: Self-pay

## 2018-08-09 ENCOUNTER — Other Ambulatory Visit: Payer: Self-pay | Admitting: Internal Medicine

## 2018-08-09 ENCOUNTER — Other Ambulatory Visit: Payer: Self-pay | Admitting: *Deleted

## 2018-08-09 MED ORDER — POTASSIUM CHLORIDE ER 10 MEQ PO TBCR: 10.0000 meq | EXTENDED_RELEASE_TABLET | Freq: Two times a day (BID) | ORAL | 0 refills | Status: DC

## 2018-08-09 MED ORDER — WARFARIN SODIUM 5 MG PO TABS: ORAL_TABLET | ORAL | 0 refills | Status: DC

## 2018-08-09 MED ORDER — ROSUVASTATIN CALCIUM 40 MG PO TABS: 40.0000 mg | ORAL_TABLET | Freq: Every day | ORAL | 0 refills | Status: DC

## 2018-08-09 NOTE — Telephone Encounter (Signed)
Copied from CRM [id number]. Topic: Quick Communication - Rx Refill/Question >> Aug 09, 2018 12:34 PM Maia Petties wrote: Medication:  Warfarin Sodium TAKE 2 TABLETS TODAY WITH SUPPER. ALTERNATE WITH ONE AND ONE-HALF TABLET EVERY OTHER DAY - 4 pills left  Potassium Chloride Crys ER 10 MEQ TAKE 1 TABLET BY MOUTH TWICE DAILY  - 1 pill left  Rosuvastatin Calcium 40 MG TAKE 1 TABLET BY MOUTH ONCE DAILY AT 6 PM - pt out of this  Has the patient contacted their pharmacy? yes Agent: If yes, when and what did the pharmacy advise? Requests were sent to the wrong office (sent to rehab 9/4)  Preferred Pharmacy (with phone number or street name): [facility] [address] [place], [place] - [address] [phone] (Phone) [phone] (Fax)

## 2018-08-09 NOTE — Telephone Encounter (Signed)
Received refill request from [facility] on [address] for pended meds. You were not the last prescriber of these medications. Please advise if these are ok to refill.

## 2018-08-09 NOTE — Telephone Encounter (Signed)
Attempted to contact pt; spoke with his daughter [name]; she says that his refill requests were sent to the wrong provider; she says that these medications are per Dr Ladona Ridgel; will call pharmacy so that medication refills can be addressed.

## 2018-08-09 NOTE — Telephone Encounter (Signed)
Spoke with Tamsen Meek, Pharmacist at [facility]; informed her that these original orders were not from Dr Lawerance Bach; explained that coumadin was [facility] Dr Sharrell Ku; she says that the only potassium prescription was per Delle Reining; rosuvastatin was also written per Delle Reining; she states that she will resend refill requests to Dr Ladona Ridgel.

## 2018-08-12 ENCOUNTER — Ambulatory Visit (INDEPENDENT_AMBULATORY_CARE_PROVIDER_SITE_OTHER): Payer: Managed Care, Other (non HMO)

## 2018-08-12 ENCOUNTER — Ambulatory Visit: Payer: Managed Care, Other (non HMO) | Admitting: Internal Medicine

## 2018-08-12 DIAGNOSIS — G459 Transient cerebral ischemic attack, unspecified: Secondary | ICD-10-CM

## 2018-08-12 DIAGNOSIS — Z5181 Encounter for therapeutic drug level monitoring: Secondary | ICD-10-CM | POA: Diagnosis not present

## 2018-08-12 DIAGNOSIS — Z8673 Personal history of transient ischemic attack (TIA), and cerebral infarction without residual deficits: Secondary | ICD-10-CM | POA: Diagnosis not present

## 2018-08-12 LAB — POCT INR: INR: 1.6 — AB (ref 2.0–3.0)

## 2018-08-12 MED ORDER — WARFARIN SODIUM 5 MG PO TABS: ORAL_TABLET | ORAL | 1 refills | Status: DC

## 2018-08-12 NOTE — Patient Instructions (Signed)
Description   Take 1.5 tablets today, then start taking 1.5 tablets daily except 1 tablet on Mondays, Wednesdays and Fridays.  Recheck INR in 10 days.  Report to ER with any bleeding, falls, accidents. Call the Coumadin clinic with any questions #[phone].

## 2018-08-13 ENCOUNTER — Ambulatory Visit: Payer: Managed Care, Other (non HMO) | Admitting: Occupational Therapy

## 2018-08-13 ENCOUNTER — Ambulatory Visit: Payer: Managed Care, Other (non HMO) | Admitting: Physical Therapy

## 2018-08-14 ENCOUNTER — Ambulatory Visit: Payer: Managed Care, Other (non HMO) | Admitting: Adult Health

## 2018-08-15 ENCOUNTER — Ambulatory Visit: Payer: Managed Care, Other (non HMO) | Admitting: Physical Therapy

## 2018-08-15 ENCOUNTER — Encounter: Payer: Self-pay | Admitting: Physical Therapy

## 2018-08-15 ENCOUNTER — Ambulatory Visit: Payer: Managed Care, Other (non HMO) | Admitting: Occupational Therapy

## 2018-08-15 DIAGNOSIS — I69354 Hemiplegia and hemiparesis following cerebral infarction affecting left non-dominant side: Secondary | ICD-10-CM

## 2018-08-15 DIAGNOSIS — R2689 Other abnormalities of gait and mobility: Secondary | ICD-10-CM

## 2018-08-15 DIAGNOSIS — M6281 Muscle weakness (generalized): Secondary | ICD-10-CM | POA: Diagnosis not present

## 2018-08-15 DIAGNOSIS — R41842 Visuospatial deficit: Secondary | ICD-10-CM

## 2018-08-15 DIAGNOSIS — R278 Other lack of coordination: Secondary | ICD-10-CM

## 2018-08-15 NOTE — Therapy (Signed)
[facility] [address] [place], [place], [address] Phone: [phone]   Fax:  [phone]  Physical Therapy Treatment  Patient Details  Name: Karl [name] MRN: [id number] Date of Birth: [birth year] Referring Provider: Alger Simons, MD   Encounter Date: 08/15/2018  PT End of Session - 08/15/18 1104    Visit Number  4    Number of Visits  30   hard max limit=30   Date for PT Re-Evaluation  11/15/18    Authorization Type  [employer] $3500 oop met, 100% coverage; VL PT & speech 30 visit limit hard max zero used before eval.    PT Start Time  1102    PT Stop Time  1143    PT Time Calculation (min)  41 min    Equipment Utilized During Treatment  Gait belt    Activity Tolerance  Patient tolerated treatment well    Behavior During Therapy  W[facility] for tasks assessed/performed       Past Medical History:  Diagnosis Date  . Acute CVA (cerebrovascular accident) ([place]) 06/14/2018  . Coronary artery disease    blockage   Stent Dr. Lovena Le 15 years 1998  . Dyspnea   . Hypertension   . Stroke [facility]) 2019   denies residual on 06/14/2018  . Type II diabetes mellitus ([place]) 07/2014 dx    Past Surgical History:  Procedure Laterality Date  . CORONARY ANGIOPLASTY WITH STENT PLACEMENT  1998  . FRACTURE SURGERY    . GREEN LIGHT LASER TURP (TRANSURETHRAL RESECTION OF PROSTATE N/A 06/05/2018   Procedure: GREEN LIGHT LASER TURP , TRANSURETHRAL RESECTION OF PROSTATE;  Surgeon: [name], MD;  Location: [facility];  Service: Urology;  Laterality: N/A;  . TIBIA FRACTURE SURGERY Left 1980s    There were no vitals filed for this visit.  Subjective Assessment - 08/15/18 1103    Subjective  No falls or pain reported, states HEP is going well.     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  No/denies            [facility] Adult PT Treatment/Exercise - 08/15/18 0001      Transfers   Transfers  Sit to Stand;Stand to Sit    Sit to Stand  4: Min guard    Sit to Stand Details  Verbal cues for technique    Sit to Stand Details (indicate cue type and reason)  cues for fwd weight shifting and to power up through LE's.     Stand to Sit  4: Min guard    Stand to Sit Details (indicate cue type and reason)  Verbal cues for technique    Stand to Sit Details  cues for proper hand placement/reaching back for surface and controlled descent.     Number of Reps  10 reps;Other sets (comment)   3 sets   Comments  sit<>stand transfers performed for left LE weight bearing/shifting/neuro re-ed: pt performed 10 reps in staggered stance with left foot back/right foot forward, then 10 reps with right foot on 4 inch box, then 10 reps with right foot on foam bubble. cues for full upright posture  each time, along with facilitation to weight shift onto left leg and remain there through out the movement.       Ambulation/Gait   Ambulation/Gait  Yes    Ambulation/Gait Assistance  4: Min guard;5: Supervision    Ambulation/Gait Assistance Details  cues for posture, increased step length     Ambulation Distance (Feet)  --   around gym with activities   Assistive device  Rolling walker    Gait Pattern  Step-through pattern;Decreased step length - right;Decreased stance time - left;Decreased hip/knee flexion - left;Decreased weight shift to left;Left hip hike;Left steppage;Ataxic;Antalgic;Lateral hip instability;Trunk flexed;Abducted - left;Poor foot clearance - left    Ambulation Surface  Level;Indoor    Stairs  --      Neuro Re-ed    Neuro Re-ed Details   standing at bottom of stairs: right stance with left foot taps up/down bottom 3 steps x 10 reps  with emphasis on lifitng hip/foot, not allowing foot to "drag". bil UE support for balance.                         Exercises   Exercises  Knee/Hip      Knee/Hip Exercises: Machines for Strengthening   Total Gym Leg Press  LLE: 40LBS, 10reps/2sets, 5 second holds, manual assist to avoid hyperextension, cues for proper form, breathing technique.       Knee/Hip Exercises: Standing   Lateral Step Up  Left;1 set;10 reps    Lateral Step Up Limitations  manual assistance required to prevent hyperextension, cues for proper technique.    Forward Step Up  Left;10 reps;1 set    Forward Step Up Limitations  manual assistance required to prevent hyperextension, cues for technique, seated rest break required after activity.     Other Standing Knee Exercises  --               PT Short Term Goals - 07/29/18 2123      PT SHORT TERM GOAL #1   Title  Patient demonstrates understanding of initial HEP. (All STGs Target Date: 08/30/2018)    Time  1    Period  Months    Status  New    Target Date  08/30/18      PT SHORT TERM GOAL #2   Title  Patient ambulates 300' with RW with supervision & scans environment with loss of balance.     Time  1    Period  Months    Status  New    Target Date  08/30/18      PT SHORT TERM GOAL #3   Title  Patient negotiates ramps & curbs with RW and stairs with 1 rail & cane with min guard.     Time  1    Period  Months    Status  New    Target Date  08/30/18      PT SHORT TERM GOAL #4   Title  Patient performs standing balance with RW support: reaching 10" anteriorly & to floor, looks over shoulders with supervision.     Time  1    Period  Months    Status  New    Target Date  08/30/18        PT Long Term Goals - 07/29/18 2117      PT LONG TERM GOAL #1   Title  Patient demonstrates & verbalizes ongoing HEP / fitness plan with medical issues. (All LTGs Target Date:  11/15/2018)    Time  15    Period  Weeks    Status  New    Target Date  11/15/18       PT LONG TERM GOAL #2   Title  Berg Balance >45/56 to indicate lower fall risk.     Time  15    Period  Weeks    Status  New    Target Date  11/15/18      PT LONG TERM GOAL #3   Title  Timed Up & Go with cane or less <30 sec safely.    Time  15    Period  Weeks    Status  New    Target Date  11/15/18      PT LONG TERM GOAL #4   Title  Dynamic Gait Index with cane >12/24 to indicate lower fall risk.     Time  15    Period  Weeks    Status  New    Target Date  11/15/18      PT LONG TERM GOAL #5   Title  Patient ambulates 400' with cane modified independent for community mobility.     Time  15    Period  Weeks    Status  New    Target Date  11/15/18      PT LONG TERM GOAL #6   Title  Patient negotiates ramps, curbs & stairs single rail with cane modified independent for community access.    Time  15    Period  Weeks    Status  New    Target Date  11/15/18            Plan - 08/15/18 1104    Clinical Impression Statement  Today's skilled session fucused on left LE weight bearing/strengthening/muscle re-education with no signifianct issues reported/noted. The pt did report increased fatigue with activities, needing rest breaks throughout the session. The pt is progressing toward goals and should benefit from continued PT to progress toward unmet goals.     Rehab Potential  Good    Clinical Impairments Affecting Rehab Potential  Right CVA 06/14/2018, Left CVA Jan 2019, CAD, CABG, cardiomyopathy, HTN, tibia fx with surgery, DM2,     PT Frequency  2x / week    PT Duration  Other (comment)   15 weeks   PT Treatment/Interventions  ADLs/Self Care Home Management;Canalith Repostioning;DME Instruction;Gait training;Stair training;Functional mobility training;Therapeutic activities;Therapeutic exercise;Balance training;Neuromuscular re-education;Patient/family education;Orthotic Fit/Training;Manual techniques;Vestibular    PT Next Visit Plan  continued to emphasize left LE for  weight bearing and strengthening; gait with RW progressing to cane/single UE support- ? parallel bars- working on increased left LE stance and weight bearing.     PT Home Exercise Plan  [email or web address]     Consulted and Agree with Plan of Care  Patient;Family member/caregiver    Family Member Consulted  dtr, [name]       Patient will benefit from skilled therapeutic intervention in order to improve the following deficits and impairments:  Abnormal gait, Decreased activity tolerance, Decreased balance, Decreased coordination, Decreased endurance, Decreased knowledge of use of DME, Decreased mobility, Decreased strength, Dizziness, Impaired flexibility, Impaired sensation, Postural dysfunction  Visit Diagnosis: Muscle weakness (generalized)  Hemiplegia and hemiparesis following cerebral infarction affecting left non-dominant side (HCC)  Other abnormalities of gait and mobility     Problem List Patient Active Problem List   Diagnosis Date Noted  . 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 Chronic anticoagulation   .  Acute ischemic right PCA stroke ([place]) 06/18/2018  . CVA (cerebral vascular accident) ([place]) 06/14/2018  . Urinary retention 06/05/2018  . Encounter for therapeutic drug monitoring 02/08/2018  . History of CVA (cerebrovascular accident) 12/20/2017  . Coronary artery disease 12/19/2017  . Mild renal insufficiency 12/19/2017  . TIA (transient ischemic attack) 12/19/2017  . Hip flexor tendinitis 05/22/2017  . Pain of both hip joints 04/25/2017  . Dyslipidemia 09/01/2015  . BPH without urinary obstruction 09/01/2015  . DM (diabetes mellitus), type 2, uncontrolled, periph vascular complic ([place]) 78/[id number]/8478  . Essential hypertension 04/20/2009  . Cardiomyopathy, ischemic 04/20/2009  . LEFT BUNDLE BRANCH BLOCK 04/20/2009  . Chronic systolic heart failure ([place]) 04/20/2009   Willow Ora, PTA, [facility] [address], [place], [place]  [address] [phone] 08/15/18, 11:24 PM   Name: Karl [name] MRN: [id number] Date of Birth: Mar 08, 1956

## 2018-08-15 NOTE — Therapy (Signed)
[facility] [address] [place], [place], [address] Phone: [phone]   Fax:  [phone]  Occupational Therapy Treatment  Patient Details  Name: Karl [name] MRN: [id number] Date of Birth: 05/25/1956 Referring Provider: Faith Rogue, MD   Encounter Date: 08/15/2018  OT End of Session - 08/15/18 1054    Visit Number  7    Number of Visits  17    Date for OT Re-Evaluation  09/08/18    Authorization Type  [employer]    Authorization - Visit Number  6    Authorization - Number of Visits  30    OT Start Time  1025    OT Stop Time  1100    OT Time Calculation (min)  35 min       Past Medical History:  Diagnosis Date  . Acute CVA (cerebrovascular accident) (HCC) 06/14/2018  . Coronary artery disease    blockage   Stent Dr. Ladona Ridgel 15 years 1998  . Dyspnea   . Hypertension   . Stroke [facility]) 2019   denies residual on 06/14/2018  . Type II diabetes mellitus (HCC) 07/2014 dx    Past Surgical History:  Procedure Laterality Date  . CORONARY ANGIOPLASTY WITH STENT PLACEMENT  1998  . FRACTURE SURGERY    . GREEN LIGHT LASER TURP (TRANSURETHRAL RESECTION OF PROSTATE N/A 06/05/2018   Procedure: GREEN LIGHT LASER TURP , TRANSURETHRAL RESECTION OF PROSTATE;  Surgeon: Crista Elliot, MD;  Location: [facility];  Service: Urology;  Laterality: N/A;  . TIBIA FRACTURE SURGERY Left 1980s    There were no vitals filed for this visit.  Subjective Assessment - 08/15/18 1054    Pertinent History  CVA 06/14/18, T2DM, CAD with ICM on chronic coumadin, prior CVA 12/2017, TURP 06/05/18 for BPH    Patient Stated Goals  regain independence    Currently in Pain?  No/denies          Treatment: Supine closed chain shoulder flexion and chest press with medium ball, min-mod facilitation.Walll pushups with min facilitation/ v.c Functional open chain reaching mid range to retrieve items  from low cabinets in standing, min facilitation/ v.c,  standing to place/ remove graded clothespins from vertical antennae. Seated placing large pegs in pegboard  For increased fine motor coordination with LUE, min difficulty, min v.c                   OT Short Term Goals - 08/07/18 1025      OT SHORT TERM GOAL #1   Title  I with inital HEP    Time  4    Period  Weeks    Status  On-going      OT SHORT TERM GOAL #2   Title  Pt will perform all basic ADLs modified independently.    Status  On-going      OT SHORT TERM GOAL #3   Title  Pt will demonstrate improved fine motor coordination for ADLS as evidenced by decreasing 9 hole peg test score to 75 secs or less.    Status  On-going      OT SHORT TERM GOAL #4   Title  Pt will perfrom snack prep/ basic home management with supervision.    Status  On-going      OT SHORT TERM GOAL #5   Title  Pt will verbalize understanding of compensatory strategiues for short term memory deficits.    Status  On-going  OT SHORT TERM GOAL #6   Title  Further assess vision and cognition and set goals prn    Status  On-going        OT Long Term Goals - 07/10/18 1850      OT LONG TERM GOAL #1   Title  I with updated HEP.    Time  8    Period  Weeks    Status  New    Target Date  09/08/18      OT LONG TERM GOAL #2   Title  Pt will perform basic home managmeent and cooking at a modified independent level demonstrating good safety awareness.    Time  8    Period  Weeks    Status  New      OT LONG TERM GOAL #3   Title  Pt will navigate a busy environment and locate items with 90% or better accuracy.    Time  8    Period  Weeks    Status  New      OT LONG TERM GOAL #4   Title  Pt will demonstrate improved fine motor coordination for ADLs as evidenced by decreasing 9 hole peg test score to 60 secs or less.    Time  8    Period  Weeks    Status  New      OT LONG TERM GOAL #5   Title  Pt will demonstrate improved LUE strength as evidenced by retrieving a 3 lbs item from  overhead shelf at 130 without drops.    Time  8    Period  Weeks    Status  New            Plan - 08/15/18 1056    Clinical Impression Statement  Pt is progressing towards goals. He demonstrates improving RUE functional use and functional mobility.    Occupational performance deficits (Please refer to evaluation for details):  ADL's;IADL's;Work;Leisure;Social Participation    Rehab Potential  Good    Current Impairments/barriers affecting progress:  cognitive and visual perceptual deficits, decreased balance    OT Frequency  --   16 visits   OT Duration  12 weeks    OT Treatment/Interventions  Self-care/ADL training;Paraffin;Therapeutic exercise;DME and/or AE instruction;Functional Mobility Training;Cognitive remediation/compensation;Balance training;Manual Therapy;Neuromuscular education;Gait Training;Fluidtherapy;Ultrasound;Aquatic Therapy;Moist Heat;Contrast Bath;Energy conservation;Passive range of motion;Therapeutic activities;Patient/family education    Plan  continue to address LUE neuro re-ed, visual perceptual skills, attention to Lt hand/side    Consulted and Agree with Plan of Care  Patient    Family Member Consulted  dtr       Patient will benefit from skilled therapeutic intervention in order to improve the following deficits and impairments:  Abnormal gait, Decreased balance, Decreased endurance, Decreased mobility, Impaired vision/preception, Impaired perceived functional ability, Decreased range of motion, Decreased knowledge of precautions, Decreased cognition, Decreased activity tolerance, Decreased coordination, Decreased knowledge of use of DME, Decreased safety awareness, Decreased strength, Impaired UE functional use  Visit Diagnosis: Muscle weakness (generalized)  Hemiplegia and hemiparesis following cerebral infarction affecting left non-dominant side (HCC)  Other abnormalities of gait and mobility  Other lack of coordination  Visuospatial  deficit    Problem List Patient Active Problem List   Diagnosis Date Noted  . 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 Chronic anticoagulation   . Acute ischemic right PCA stroke (HCC) 06/18/2018  . CVA (cerebral vascular accident) (HCC) 06/14/2018  . Urinary retention 06/05/2018  . Encounter for therapeutic drug monitoring 02/08/2018  .  History of CVA (cerebrovascular accident) 12/20/2017  . Coronary artery disease 12/19/2017  . Mild renal insufficiency 12/19/2017  . TIA (transient ischemic attack) 12/19/2017  . Hip flexor tendinitis 05/22/2017  . Pain of both hip joints 04/25/2017  . Dyslipidemia 09/01/2015  . BPH without urinary obstruction 09/01/2015  . DM (diabetes mellitus), type 2, uncontrolled, periph vascular complic (HCC) 07/10/2014  . Essential hypertension 04/20/2009  . Cardiomyopathy, ischemic 04/20/2009  . LEFT BUNDLE BRANCH BLOCK 04/20/2009  . Chronic systolic heart failure (HCC) 04/20/2009    Partick Musselman 08/15/2018, 10:57 AM Keene Breath, OTR/L [phone] Phone: [phone] 12:18 PM 08/15/18 [facility] [address] [place], [place], [address] Phone: [phone]   Fax:  [phone]  Name: VON QUINTANAR MRN: [id number] Date of Birth: [birth year]

## 2018-08-19 ENCOUNTER — Encounter: Payer: Self-pay | Admitting: Physical Therapy

## 2018-08-19 ENCOUNTER — Ambulatory Visit: Payer: Managed Care, Other (non HMO) | Admitting: Occupational Therapy

## 2018-08-19 ENCOUNTER — Ambulatory Visit: Payer: Managed Care, Other (non HMO) | Admitting: Physical Therapy

## 2018-08-19 DIAGNOSIS — I69354 Hemiplegia and hemiparesis following cerebral infarction affecting left non-dominant side: Secondary | ICD-10-CM

## 2018-08-19 DIAGNOSIS — R2689 Other abnormalities of gait and mobility: Secondary | ICD-10-CM

## 2018-08-19 DIAGNOSIS — M6281 Muscle weakness (generalized): Secondary | ICD-10-CM

## 2018-08-19 DIAGNOSIS — R41842 Visuospatial deficit: Secondary | ICD-10-CM

## 2018-08-19 DIAGNOSIS — R278 Other lack of coordination: Secondary | ICD-10-CM

## 2018-08-19 NOTE — Therapy (Signed)
[facility] [address] [place], [place], [address] Phone: [phone]   Fax:  [phone]  Occupational Therapy Treatment  Patient Details  Name: Karl [name] MRN: [id number] Date of Birth: 22-Jul-1956 Referring Provider: Faith Rogue, MD   Encounter Date: 08/19/2018  OT End of Session - 08/19/18 1250    Visit Number  8    Number of Visits  17    Date for OT Re-Evaluation  09/08/18    Authorization Type  [employer]ign[employer]    Authorization - Visit Number  7    Authorization - Number of Visits  30    OT Start Time  1105    OT Stop Time  1145    OT Time Calculation (min)  40 min       Past Medical History:  Diagnosis Date  . Acute CVA (cerebrovascular accident) (HCC) 06/14/2018  . Coronary artery disease    blockage   Stent Dr. Ladona Ridgel 15 years 1998  . Dyspnea   . Hypertension   . Stroke [facility]) 2019   denies residual on 06/14/2018  . Type II diabetes mellitus (HCC) 07/2014 dx    Past Surgical History:  Procedure Laterality Date  . CORONARY ANGIOPLASTY WITH STENT PLACEMENT  1998  . FRACTURE SURGERY    . GREEN LIGHT LASER TURP (TRANSURETHRAL RESECTION OF PROSTATE N/A 06/05/2018   Procedure: GREEN LIGHT LASER TURP , TRANSURETHRAL RESECTION OF PROSTATE;  Surgeon: Crista Elliot, MD;  Location: [facility];  Service: Urology;  Laterality: N/A;  . TIBIA FRACTURE SURGERY Left 1980s    There were no vitals filed for this visit.  Subjective Assessment - 08/19/18 1249    Patient Stated Goals  regain independence    Currently in Pain?  No/denies          Treatment:Supine closed chain shoulder flexion and chest press with PVC pipe frame, min-mod facilitation.  Seated closed chain shoulder flexion mid range mod facilliation Functional open chain reaching mid range to to copy small peg design in standing, max difficulty,  min facilitation/ v.c, standing to place/ remove graded clothespins from vertical antennae, min v.c./ mod  difficulty                         OT Short Term Goals - 08/19/18 1251      OT SHORT TERM GOAL #1   Title  I with inital HEP    Status  On-going   needs v.c     OT SHORT TERM GOAL #2   Title  Pt will perform all basic ADLs modified independently.    Status  Achieved      OT SHORT TERM GOAL #3   Title  Pt will demonstrate improved fine motor coordination for ADLS as evidenced by decreasing 9 hole peg test score to 75 secs or less.    Status  On-going      OT SHORT TERM GOAL #4   Title  Pt will perfrom snack prep/ basic home management with supervision.    Status  On-going      OT SHORT TERM GOAL #5   Title  Pt will verbalize understanding of compensatory strategiues for short term memory deficits.    Status  On-going      OT SHORT TERM GOAL #6   Status  Deferred        OT Long Term Goals - 07/10/18 1850      OT LONG TERM  GOAL #1   Title  I with updated HEP.    Time  8    Period  Weeks    Status  New    Target Date  09/08/18      OT LONG TERM GOAL #2   Title  Pt will perform basic home managmeent and cooking at a modified independent level demonstrating good safety awareness.    Time  8    Period  Weeks    Status  New      OT LONG TERM GOAL #3   Title  Pt will navigate a busy environment and locate items with 90% or better accuracy.    Time  8    Period  Weeks    Status  New      OT LONG TERM GOAL #4   Title  Pt will demonstrate improved fine motor coordination for ADLs as evidenced by decreasing 9 hole peg test score to 60 secs or less.    Time  8    Period  Weeks    Status  New      OT LONG TERM GOAL #5   Title  Pt will demonstrate improved LUE strength as evidenced by retrieving a 3 lbs item from overhead shelf at 130 without drops.    Time  8    Period  Weeks    Status  New            Plan - 08/19/18 1252    Clinical Impression Statement  Pt is progressing towards goals. He demonstrates improving RUE functional use and  independence with basic ADLS.    Rehab Potential  Good    Current Impairments/barriers affecting progress:  cognitive and visual perceptual deficits, decreased balance    OT Frequency  --   16 visits   OT Duration  12 weeks    OT Treatment/Interventions  Self-care/ADL training;Paraffin;Therapeutic exercise;DME and/or AE instruction;Functional Mobility Training;Cognitive remediation/compensation;Balance training;Manual Therapy;Neuromuscular education;Gait Training;Fluidtherapy;Ultrasound;Aquatic Therapy;Moist Heat;Contrast Bath;Energy conservation;Passive range of motion;Therapeutic activities;Patient/family education    Plan  continue to address LUE neuro re-ed, visual perceptual skills, attention to Lt hand/side    Consulted and Agree with Plan of Care  Patient       Patient will benefit from skilled therapeutic intervention in order to improve the following deficits and impairments:  Abnormal gait, Decreased balance, Decreased endurance, Decreased mobility, Impaired vision/preception, Impaired perceived functional ability, Decreased range of motion, Decreased knowledge of precautions, Decreased cognition, Decreased activity tolerance, Decreased coordination, Decreased knowledge of use of DME, Decreased safety awareness, Decreased strength, Impaired UE functional use  Visit Diagnosis: Muscle weakness (generalized)  Hemiplegia and hemiparesis following cerebral infarction affecting left non-dominant side (HCC)  Other abnormalities of gait and mobility  Other lack of coordination  Visuospatial deficit    Problem List Patient Active Problem List   Diagnosis Date Noted  . 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 Chronic anticoagulation   . Acute ischemic right PCA stroke (HCC) 06/18/2018  . CVA (cerebral vascular accident) (HCC) 06/14/2018  . Urinary retention 06/05/2018  . Encounter for therapeutic drug monitoring 02/08/2018  . History of CVA (cerebrovascular accident) 12/20/2017   . Coronary artery disease 12/19/2017  . Mild renal insufficiency 12/19/2017  . TIA (transient ischemic attack) 12/19/2017  . Hip flexor tendinitis 05/22/2017  . Pain of both hip joints 04/25/2017  . Dyslipidemia 09/01/2015  . BPH without urinary obstruction 09/01/2015  . DM (diabetes mellitus), type 2, uncontrolled, periph vascular complic (HCC) 07/10/2014  .  Essential hypertension 04/20/2009  . Cardiomyopathy, ischemic 04/20/2009  . LEFT BUNDLE BRANCH BLOCK 04/20/2009  . Chronic systolic heart failure (HCC) 04/20/2009    Sota Hetz 08/19/2018, 12:55 PM  [facility] [address] [place], [place], [address] Phone: [phone]   Fax:  [phone]  Name: FABRICIO ENDSLEY MRN: [id number] Date of Birth: 04-13-1956

## 2018-08-19 NOTE — Therapy (Signed)
[facility] [address] [place], [place], [address] Phone: [phone]   Fax:  [phone]  Physical Therapy Treatment  Patient Details  Name: Karl [name] MRN: [id number] Date of Birth: [birth year] Referring Provider: Alger Simons, MD   Encounter Date: 08/19/2018  PT End of Session - 08/19/18 1035    Visit Number  5    Number of Visits  30   hard max limit=30   Date for PT Re-Evaluation  11/15/18    Authorization Type  [employer] [id number] oop met, 100% coverage; VL PT & speech 30 visit limit hard max zero used before eval.    PT Start Time  1031   pt running late for appt today   PT Stop Time  1100    PT Time Calculation (min)  29 min    Equipment Utilized During Treatment  Gait belt    Activity Tolerance  Patient tolerated treatment well    Behavior During Therapy  WFL for tasks assessed/performed       Past Medical History:  Diagnosis Date  . Acute CVA (cerebrovascular accident) ([place]) 06/14/2018  . Coronary artery disease    blockage   Stent Dr. Lovena Le 15 years 1998  . Dyspnea   . Hypertension   . Stroke [facility]) 2019   denies residual on 06/14/2018  . Type II diabetes mellitus ([place]) 07/2014 dx    Past Surgical History:  Procedure Laterality Date  . CORONARY ANGIOPLASTY WITH STENT PLACEMENT  1998  . FRACTURE SURGERY    . GREEN LIGHT LASER TURP (TRANSURETHRAL RESECTION OF PROSTATE N/A 06/05/2018   Procedure: GREEN LIGHT LASER TURP , TRANSURETHRAL RESECTION OF PROSTATE;  Surgeon: [name], MD;  Location: [facility];  Service: Urology;  Laterality: N/A;  . TIBIA FRACTURE SURGERY Left 1980s    There were no vitals filed for this visit.  Subjective Assessment - 08/19/18 1033    Subjective  Reports it took 2 days for his left LE to recover fatigue wise after last session. No falls or pain to report. Late today because he was slow to get ready this am due to he didn't think he had PT today (daughter told him  last night he didn't have one today).     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  No/denies    Pain Score  0-No pain         08/19/18 1036  Transfers  Transfers Sit to Stand;Stand to Sit  Sit to Stand 5: Supervision;With upper extremity assist;From bed;From chair/3-in-1  Sit to Stand Details (indicate cue type and reason) cues for weight shifting and hand placement at times  Stand to Sit 5: Supervision;With upper extremity assist;To bed;To chair/3-in-1  Stand to Sit Details cues to use arms to controll descent with sitting   Ambulation/Gait  Ambulation/Gait Yes  Ambulation/Gait Assistance 4: Min guard;5: Supervision  Ambulation/Gait Assistance Details cues for posture, equal step length and walker position with gait  Ambulation Distance (Feet) 100 Feet (x1, plus around gym)  Assistive device Rolling walker  Gait Pattern Step-through pattern;Decreased step length - right;Decreased stance time - left;Decreased hip/knee flexion -  left;Decreased weight shift to left;Left hip hike;Left steppage;Ataxic;Antalgic;Lateral hip instability;Trunk flexed;Abducted - left;Poor foot clearance - left  Ambulation Surface Level;Indoor        08/19/18 1036  Balance Exercises: Standing  Rockerboard Anterior/posterior;Lateral;EO;Head turns;EC;30 seconds;10 reps  Balance Beam standing across blue foam beam: alternating fwd heel taps to floor/back onto beam, then alternating bwd toe tap/back onto beam. progressed from bil hand support on bars, to single hand support, to no hand support. cues needed for posture, incr step length and incr step height with min guard assist.   Balance Exercises:  Standing  Rebounder Limitations performed both ways on balance board with no UE support/intermittent touch to bars for balance: rocking board with emphasis on tall posture with EO; holding board steady with EC no head movements, progressing EC head movement left<>right and up<>down. min to mod assist for balance with cues on posture, weight shifting and posture for balance.                                PT Short Term Goals - 07/29/18 2123      PT SHORT TERM GOAL #1   Title  Patient demonstrates understanding of initial HEP. (All STGs Target Date: 08/30/2018)    Time  1    Period  Months    Status  New    Target Date  08/30/18      PT SHORT TERM GOAL #2   Title  Patient ambulates 300' with RW with supervision & scans environment with loss of balance.     Time  1    Period  Months    Status  New    Target Date  08/30/18      PT SHORT TERM GOAL #3   Title  Patient negotiates ramps & curbs with RW and stairs with 1 rail & cane with min guard.     Time  1    Period  Months    Status  New    Target Date  08/30/18      PT SHORT TERM GOAL #4   Title  Patient performs standing balance with RW support: reaching 10" anteriorly & to floor, looks over shoulders with supervision.     Time  1    Period  Months    Status  New    Target Date  08/30/18        PT Long Term Goals - 07/29/18 2117      PT LONG TERM GOAL #1   Title  Patient demonstrates & verbalizes ongoing HEP / fitness plan with medical issues. (All LTGs Target Date: 11/15/2018)    Time  15    Period  Weeks    Status  New    Target Date  11/15/18      PT LONG TERM GOAL #2   Title  Berg Balance >45/56 to indicate lower fall risk.     Time  15    Period  Weeks    Status  New    Target Date  11/15/18      PT LONG TERM GOAL #3   Title  Timed Up & Go with cane or less <30 sec safely.    Time  15    Period  Weeks    Status  New    Target Date  11/15/18      PT LONG TERM GOAL #4   Title  Dynamic Gait Index with  cane >12/24 to indicate lower fall risk.     Time  15    Period  Weeks    Status  New    Target Date  11/15/18      PT LONG TERM GOAL #5   Title  Patient ambulates 400' with cane modified independent for community mobility.     Time  15    Period  Weeks    Status  New    Target Date  11/15/18      PT LONG TERM GOAL #6   Title  Patient negotiates ramps, curbs & stairs single rail with cane modified independent for community access.    Time  15    Period  Weeks    Status  New    Target Date  11/15/18         08/19/18 1035  Plan  Clinical Impression Statement Today's skilled session continued to focus on balance reactions and LE strengthening. No issues reported in session today. The pt continues to make progress toward goals and should benefit from continued PT to progress toward unmet goals.   Pt will benefit from skilled therapeutic intervention in order to improve on the following deficits Abnormal gait;Decreased activity tolerance;Decreased balance;Decreased coordination;Decreased endurance;Decreased knowledge of use of DME;Decreased mobility;Decreased strength;Dizziness;Impaired flexibility;Impaired sensation;Postural dysfunction  Rehab Potential Good  Clinical Impairments Affecting Rehab Potential Right CVA 06/14/2018, Left CVA Jan 2019, CAD, CABG, cardiomyopathy, HTN, tibia fx with surgery, DM2,   PT Frequency 2x / week  PT Duration Other (comment) (15 weeks)  PT Treatment/Interventions ADLs/Self Care Home Management;Canalith Repostioning;DME Instruction;Gait training;Stair training;Functional mobility training;Therapeutic activities;Therapeutic exercise;Balance training;Neuromuscular re-education;Patient/family education;Orthotic Fit/Training;Manual techniques;Vestibular  PT Next Visit Plan continued to emphasize left LE for weight bearing and strengthening; gait with RW progressing to cane/single UE support, ? scifit or nustep for strengthening and activity tolerance  PT Home  Exercise Plan [id number]   Consulted and Agree with Plan of Care Patient;Family member/caregiver  Family Member Consulted dtr, [name]          Patient will benefit from skilled therapeutic intervention in order to improve the following deficits and impairments:  Abnormal gait, Decreased activity tolerance, Decreased balance, Decreased coordination, Decreased endurance, Decreased knowledge of use of DME, Decreased mobility, Decreased strength, Dizziness, Impaired flexibility, Impaired sensation, Postural dysfunction  Visit Diagnosis: Muscle weakness (generalized)  Hemiplegia and hemiparesis following cerebral infarction affecting left non-dominant side (HCC)  Other abnormalities of gait and mobility     Problem List Patient Active Problem List   Diagnosis Date Noted  . 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 Chronic anticoagulation   . Acute ischemic right PCA stroke ([place]) 06/18/2018  . CVA (cerebral vascular accident) ([place]) 06/14/2018  . Urinary retention 06/05/2018  . Encounter for therapeutic drug monitoring 02/08/2018  . History of CVA (cerebrovascular accident) 12/20/2017  . Coronary artery disease 12/19/2017  . Mild renal insufficiency 12/19/2017  . TIA (transient ischemic attack) 12/19/2017  . Hip flexor tendinitis 05/22/2017  . Pain of both hip joints 04/25/2017  . Dyslipidemia 09/01/2015  . BPH without urinary obstruction 09/01/2015  . DM (diabetes mellitus), type 2, uncontrolled, periph vascular complic ([place]) 81/44/8185  . Essential hypertension 04/20/2009  . Cardiomyopathy, ischemic 04/20/2009  . LEFT BUNDLE BRANCH BLOCK 04/20/2009  . Chronic systolic heart failure ([place]) 04/20/2009    Willow Ora, PTA, [facility] [address], [place], [place] [address] [phone] 08/20/18, 7:19 PM   Name: Karl [name] MRN: [id number] Date of  Birth: [birth year]

## 2018-08-21 ENCOUNTER — Encounter: Payer: Self-pay | Admitting: Physical Therapy

## 2018-08-21 ENCOUNTER — Ambulatory Visit: Payer: Managed Care, Other (non HMO) | Admitting: Occupational Therapy

## 2018-08-21 ENCOUNTER — Ambulatory Visit: Payer: Managed Care, Other (non HMO) | Admitting: Physical Therapy

## 2018-08-21 ENCOUNTER — Encounter: Payer: Self-pay | Admitting: Occupational Therapy

## 2018-08-21 DIAGNOSIS — M6281 Muscle weakness (generalized): Secondary | ICD-10-CM

## 2018-08-21 DIAGNOSIS — R278 Other lack of coordination: Secondary | ICD-10-CM

## 2018-08-21 DIAGNOSIS — R41842 Visuospatial deficit: Secondary | ICD-10-CM

## 2018-08-21 DIAGNOSIS — I69354 Hemiplegia and hemiparesis following cerebral infarction affecting left non-dominant side: Secondary | ICD-10-CM

## 2018-08-21 DIAGNOSIS — R2689 Other abnormalities of gait and mobility: Secondary | ICD-10-CM

## 2018-08-21 DIAGNOSIS — R2681 Unsteadiness on feet: Secondary | ICD-10-CM

## 2018-08-21 DIAGNOSIS — R293 Abnormal posture: Secondary | ICD-10-CM

## 2018-08-21 NOTE — Therapy (Signed)
[facility] [address] [place], [place], [address] Phone: [phone]   Fax:  [phone]  Occupational Therapy Treatment  Patient Details  Name: Karl [name] MRN: [id number] Date of Birth: Sep 30, 1956 Referring Provider: Faith Rogue, MD   Encounter Date: 08/21/2018  OT End of Session - 08/21/18 1108    Visit Number  9    Number of Visits  17    Date for OT Re-Evaluation  09/08/18    Authorization Type  [employer]    Authorization - Visit Number  8    Authorization - Number of Visits  30    OT Start Time  1017    OT Stop Time  1100    OT Time Calculation (min)  43 min    Activity Tolerance  Patient tolerated treatment well    Behavior During Therapy  [facility] for tasks assessed/performed       Past Medical History:  Diagnosis Date  . Acute CVA (cerebrovascular accident) (HCC) 06/14/2018  . Coronary artery disease    blockage   Stent Dr. Ladona Ridgel 15 years 1998  . Dyspnea   . Hypertension   . Stroke [facility]) 2019   denies residual on 06/14/2018  . Type II diabetes mellitus (HCC) 07/2014 dx    Past Surgical History:  Procedure Laterality Date  . CORONARY ANGIOPLASTY WITH STENT PLACEMENT  1998  . FRACTURE SURGERY    . GREEN LIGHT LASER TURP (TRANSURETHRAL RESECTION OF PROSTATE N/A 06/05/2018   Procedure: GREEN LIGHT LASER TURP , TRANSURETHRAL RESECTION OF PROSTATE;  Surgeon: Crista Elliot, MD;  Location: [facility];  Service: Urology;  Laterality: N/A;  . TIBIA FRACTURE SURGERY Left 1980s    There were no vitals filed for this visit.  Subjective Assessment - 08/21/18 1022    Subjective   Pt reports "I'm stiff on the left side" following doing home exercises yesterday.    Pertinent History  CVA 06/14/18, T2DM, CAD with ICM on chronic coumadin, prior CVA 12/2017, TURP 06/05/18 for BPH    Patient Stated Goals  regain independence    Currently in Pain?  No/denies    Pain Score  0-No pain                   OT  Treatments/Exercises (OP) - 08/21/18 0001      ADLs   ADL Comments  Verbal review of bilateral hand/arm use during ADL's, sttending to lefft side when dressing, bathing. Pt reports "my shoes are getting easier" to put on and tie.      Visual/Perceptual Exercises   Scanning  Tabletop    Scanning - Tabletop  Cross out the number 88 everytime you see it - to encourage visual scanning and attention to left. Pt missed two (one in midline and one to the right).      Neurological Re-education Exercises   Scapular Stabilization  Bilateral;10 reps;Seated    Other Information  Sitting on mat, chest press with ball focus on scapualr stabilization & gliding  w/ controlled movement x10 reps each. Min vc's x1 to avoid compensation    Reciprocal Movements  Supine closed chain with ball for shoulder flexion and chest press, diagonal/crossing midline and recipricol shoulder movement using dowel. Focus on controlled movement and avoidance of compensatory patterns.   x10 reps each            OT Education - 08/21/18 1108    Education Details  Reviewed home program  Person(s) Educated  Patient    Methods  Explanation;Demonstration;Verbal cues    Comprehension  Verbalized understanding;Returned demonstration;Verbal cues required       OT Short Term Goals - 08/19/18 1251      OT SHORT TERM GOAL #1   Title  I with inital HEP    Status  On-going   needs v.c     OT SHORT TERM GOAL #2   Title  Pt will perform all basic ADLs modified independently.    Status  Achieved      OT SHORT TERM GOAL #3   Title  Pt will demonstrate improved fine motor coordination for ADLS as evidenced by decreasing 9 hole peg test score to 75 secs or less.    Status  On-going      OT SHORT TERM GOAL #4   Title  Pt will perfrom snack prep/ basic home management with supervision.    Status  On-going      OT SHORT TERM GOAL #5   Title  Pt will verbalize understanding of compensatory strategiues for short term memory  deficits.    Status  On-going      OT SHORT TERM GOAL #6   Status  Deferred        OT Long Term Goals - 07/10/18 1850      OT LONG TERM GOAL #1   Title  I with updated HEP.    Time  8    Period  Weeks    Status  New    Target Date  09/08/18      OT LONG TERM GOAL #2   Title  Pt will perform basic home managmeent and cooking at a modified independent level demonstrating good safety awareness.    Time  8    Period  Weeks    Status  New      OT LONG TERM GOAL #3   Title  Pt will navigate a busy environment and locate items with 90% or better accuracy.    Time  8    Period  Weeks    Status  New      OT LONG TERM GOAL #4   Title  Pt will demonstrate improved fine motor coordination for ADLs as evidenced by decreasing 9 hole peg test score to 60 secs or less.    Time  8    Period  Weeks    Status  New      OT LONG TERM GOAL #5   Title  Pt will demonstrate improved LUE strength as evidenced by retrieving a 3 lbs item from overhead shelf at 130 without drops.    Time  8    Period  Weeks    Status  New            Plan - 08/21/18 1109    Clinical Impression Statement  Pt is progressing towards goals and with bilateral functional use during ADL's and daily activities per his report.    Occupational Profile and client history currently impacting functional performance   Pt was completely I and working full time prior to CVA, currently he is unable to work. CVA 06/14/18,Type II DM, CAD with ICM on chronic coumadin, prior CVA 12/2017, TURP 06/05/18 for BPH     Occupational performance deficits (Please refer to evaluation for details):  ADL's;IADL's;Work;Leisure;Social Participation    Rehab Potential  Good    Current Impairments/barriers affecting progress:  cognitive and visual perceptual deficits, decreased balance    OT  Frequency  --   16 visits   OT Duration  12 weeks    OT Treatment/Interventions  Self-care/ADL training;Paraffin;Therapeutic exercise;DME and/or AE  instruction;Functional Mobility Training;Cognitive remediation/compensation;Balance training;Manual Therapy;Neuromuscular education;Gait Training;Fluidtherapy;Ultrasound;Aquatic Therapy;Moist Heat;Contrast Bath;Energy conservation;Passive range of motion;Therapeutic activities;Patient/family education    Plan  Continue to address LUE neuro re-ed, visual perceptual skills, attention to Lt hand/side    Clinical Decision Making  Several treatment options, min-mod task modification necessary    Consulted and Agree with Plan of Care  Patient       Patient will benefit from skilled therapeutic intervention in order to improve the following deficits and impairments:  Abnormal gait, Decreased balance, Decreased endurance, Decreased mobility, Impaired vision/preception, Impaired perceived functional ability, Decreased range of motion, Decreased knowledge of precautions, Decreased cognition, Decreased activity tolerance, Decreased coordination, Decreased knowledge of use of DME, Decreased safety awareness, Decreased strength, Impaired UE functional use  Visit Diagnosis: Muscle weakness (generalized)  Hemiplegia and hemiparesis following cerebral infarction affecting left non-dominant side (HCC)  Other lack of coordination  Visuospatial deficit  Other abnormalities of gait and mobility    Problem List Patient Active Problem List   Diagnosis Date Noted  . 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 Chronic anticoagulation   . Acute ischemic right PCA stroke (HCC) 06/18/2018  . CVA (cerebral vascular accident) (HCC) 06/14/2018  . Urinary retention 06/05/2018  . Encounter for therapeutic drug monitoring 02/08/2018  . History of CVA (cerebrovascular accident) 12/20/2017  . Coronary artery disease 12/19/2017  . Mild renal insufficiency 12/19/2017  . TIA (transient ischemic attack) 12/19/2017  . Hip flexor tendinitis 05/22/2017  . Pain of both hip joints 04/25/2017  . Dyslipidemia 09/01/2015  .  BPH without urinary obstruction 09/01/2015  . DM (diabetes mellitus), type 2, uncontrolled, periph vascular complic (HCC) 07/10/2014  . Essential hypertension 04/20/2009  . Cardiomyopathy, ischemic 04/20/2009  . LEFT BUNDLE BRANCH BLOCK 04/20/2009  . Chronic systolic heart failure (HCC) 04/20/2009    Eulalie Speights Dionicio Stall, OTR/L 08/21/2018, 11:15 AM  [facility] [address] [place], [place], [address] Phone: [phone]   Fax:  [phone]  Name: KAITLYN SKOWRON MRN: [id number] Date of Birth: 1956-10-21

## 2018-08-21 NOTE — Therapy (Signed)
[facility] [address] [place], [place], [address] Phone: [phone]   Fax:  [phone]  Physical Therapy Treatment  Patient Details  Name: Karl [name] MRN: [id number] Date of Birth: 10/04/1956 Referring Provider: Alger Simons, MD   Encounter Date: 08/21/2018  PT End of Session - 08/21/18 1252    Visit Number  6    Number of Visits  30    Date for PT Re-Evaluation  11/15/18    Authorization Type  [employer] [id number] oop met, 100% coverage; VL PT & speech 30 visit limit hard max zero used before eval.    PT Start Time  1105    PT Stop Time  1145    PT Time Calculation (min)  40 min    Equipment Utilized During Treatment  Gait belt    Activity Tolerance  Patient tolerated treatment well    Behavior During Therapy  WFL for tasks assessed/performed       Past Medical History:  Diagnosis Date  . Acute CVA (cerebrovascular accident) ([place]) 06/14/2018  . Coronary artery disease    blockage   Stent Dr. Lovena Le 15 years 1998  . Dyspnea   . Hypertension   . Stroke [facility]) 2019   denies residual on 06/14/2018  . Type II diabetes mellitus ([place]) 07/2014 dx    Past Surgical History:  Procedure Laterality Date  . CORONARY ANGIOPLASTY WITH STENT PLACEMENT  1998  . FRACTURE SURGERY    . GREEN LIGHT LASER TURP (TRANSURETHRAL RESECTION OF PROSTATE N/A 06/05/2018   Procedure: GREEN LIGHT LASER TURP , TRANSURETHRAL RESECTION OF PROSTATE;  Surgeon: [name], MD;  [facility];  Service: Urology;  Laterality: N/A;  . TIBIA FRACTURE SURGERY Left 1980s    There were no vitals filed for this visit.  Subjective Assessment - 08/21/18 1107    Subjective  LLE feels stiff.    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  No/denies                       [facility] Adult PT Treatment/Exercise - 08/21/18 0001      Ambulation/Gait   Ambulation/Gait  Yes    Ambulation/Gait Assistance  5: Supervision;4: Min assist   min A with SPC   Ambulation/Gait Assistance Details  working on activity tolerance    Ambulation Distance (Feet)  275 Feet   with Walker +150 with SPC   Assistive device  Rolling walker;Straight cane    Gait Pattern  Step-through pattern;Decreased step length - right;Decreased stance time - left;Decreased hip/knee flexion - left;Decreased weight shift to left;Left hip hike;Left steppage;Ataxic;Antalgic;Lateral hip instability;Trunk flexed;Abducted - left;Poor foot clearance - left    Ambulation Surface  Level;Indoor    Stairs  Yes    Stairs Assistance  4: Min guard    Stairs Assistance Details (indicate cue type and reason)  with 2 rails progressing with 1 rail and SPC; step to pattern    Stair Management Technique  Two rails;Step to pattern;Forwards;With cane    Number of Stairs  4   x3   Height of Stairs  6    Ramp  5: Supervision    Ramp Details (indicate cue  type and reason)  cues for reciprocal pattern    Curb  5: Supervision    Curb Details (indicate cue type and reason)  cues for safety with walker.      Exercises   Exercises  Knee/Hip      Knee/Hip Exercises: Stretches   Active Hamstring Stretch  Right;Left;2 reps;30 seconds             PT Education - 08/21/18 1254    Education Details  Recommend pt continue to use RW for all gait but that per POC gait training with [facility] will continue.    Person(s) Educated  Patient    Methods  Explanation    Comprehension  Verbalized understanding       PT Short Term Goals - 07/29/18 2123      PT SHORT TERM GOAL #1   Title  Patient demonstrates understanding of initial HEP. (All STGs Target Date: 08/30/2018)    Time  1    Period  Months    Status  New    Target Date  08/30/18       PT SHORT TERM GOAL #2   Title  Patient ambulates 300' with RW with supervision & scans environment with loss of balance.     Time  1    Period  Months    Status  New    Target Date  08/30/18      PT SHORT TERM GOAL #3   Title  Patient negotiates ramps & curbs with RW and stairs with 1 rail & cane with min guard.     Time  1    Period  Months    Status  New    Target Date  08/30/18      PT SHORT TERM GOAL #4   Title  Patient performs standing balance with RW support: reaching 10" anteriorly & to floor, looks over shoulders with supervision.     Time  1    Period  Months    Status  New    Target Date  08/30/18        PT Long Term Goals - 07/29/18 2117      PT LONG TERM GOAL #1   Title  Patient demonstrates & verbalizes ongoing HEP / fitness plan with medical issues. (All LTGs Target Date: 11/15/2018)    Time  15    Period  Weeks    Status  New    Target Date  11/15/18      PT LONG TERM GOAL #2   Title  Berg Balance >45/56 to indicate lower fall risk.     Time  15    Period  Weeks    Status  New    Target Date  11/15/18      PT LONG TERM GOAL #3   Title  Timed Up & Go with cane or less <30 sec safely.    Time  15    Period  Weeks    Status  New    Target Date  11/15/18      PT LONG TERM GOAL #4   Title  Dynamic Gait Index with cane >12/24 to indicate lower fall risk.     Time  15    Period  Weeks    Status  New    Target Date  11/15/18      PT LONG TERM GOAL #5   Title  Patient ambulates 400' with cane modified independent for community mobility.  Time  15    Period  Weeks    Status  New    Target Date  11/15/18      PT LONG TERM GOAL #6   Title  Patient negotiates ramps, curbs & stairs single rail with cane modified independent for community access.    Time  15    Period  Weeks    Status  New    Target Date  11/15/18            Plan - 08/21/18 1248    Clinical Impression Statement  Skilled session focused on gait training: activity  tolerance to community distance and balance and technique with community barriers with RW; pt performed at supervision level.  Gait training with [place]; pt progressed with increased gait distance with min A for balance.                                                                         Rehab Potential  Good    Clinical Impairments Affecting Rehab Potential  Right CVA 06/14/2018, Left CVA Jan 2019, CAD, CABG, cardiomyopathy, HTN, tibia fx with surgery, DM2,     PT Frequency  2x / week    PT Duration  Other (comment)   15 weeks   PT Treatment/Interventions  ADLs/Self Care Home Management;Canalith Repostioning;DME Instruction;Gait training;Stair training;Functional mobility training;Therapeutic activities;Therapeutic exercise;Balance training;Neuromuscular re-education;Patient/family education;Orthotic Fit/Training;Manual techniques;Vestibular    PT Next Visit Plan  continued to emphasize left LE for weight bearing and strengthening; gait with RW progressing to cane/single UE support, ? scifit or nustep for strengthening and activity tolerance    PT Home Exercise Plan  [id number]     Consulted and Agree with Plan of Care  Patient;Family member/caregiver    Family Member Consulted  dtr, [name]       Patient will benefit from skilled therapeutic intervention in order to improve the following deficits and impairments:  Abnormal gait, Decreased activity tolerance, Decreased balance, Decreased coordination, Decreased endurance, Decreased knowledge of use of DME, Decreased mobility, Decreased strength, Dizziness, Impaired flexibility, Impaired sensation, Postural dysfunction  Visit Diagnosis: Muscle weakness (generalized)  Hemiplegia and hemiparesis following cerebral infarction affecting left non-dominant side (HCC)  Other lack of coordination  Other abnormalities of gait and mobility  Abnormal posture  Unsteadiness on feet     Problem List Patient Active Problem List   Diagnosis Date Noted   . 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 Chronic anticoagulation   . Acute ischemic right PCA stroke ([place]) 06/18/2018  . CVA (cerebral vascular accident) ([place]) 06/14/2018  . Urinary retention 06/05/2018  . Encounter for therapeutic drug monitoring 02/08/2018  . History of CVA (cerebrovascular accident) 12/20/2017  . Coronary artery disease 12/19/2017  . Mild renal insufficiency 12/19/2017  . TIA (transient ischemic attack) 12/19/2017  . Hip flexor tendinitis 05/22/2017  . Pain of both hip joints 04/25/2017  . Dyslipidemia 09/01/2015  . BPH without urinary obstruction 09/01/2015  . DM (diabetes mellitus), type 2, uncontrolled, periph vascular complic ([place]) 40/07/6760  . Essential hypertension 04/20/2009  . Cardiomyopathy, ischemic 04/20/2009  . LEFT BUNDLE BRANCH BLOCK 04/20/2009  . Chronic systolic heart failure ([place]) 04/20/2009    Bjorn Loser, PTA  08/21/18, 1:00 PM [facility] [address] [place], [place], [address] Phone: [phone]   Fax:  [phone]  Name: Karl [name] MRN: [id number] Date of Birth: 1956/04/14

## 2018-08-26 ENCOUNTER — Ambulatory Visit (INDEPENDENT_AMBULATORY_CARE_PROVIDER_SITE_OTHER): Payer: Managed Care, Other (non HMO)

## 2018-08-26 DIAGNOSIS — G459 Transient cerebral ischemic attack, unspecified: Secondary | ICD-10-CM

## 2018-08-26 DIAGNOSIS — Z5181 Encounter for therapeutic drug level monitoring: Secondary | ICD-10-CM | POA: Diagnosis not present

## 2018-08-26 DIAGNOSIS — Z8673 Personal history of transient ischemic attack (TIA), and cerebral infarction without residual deficits: Secondary | ICD-10-CM | POA: Diagnosis not present

## 2018-08-26 LAB — POCT INR: INR: 1.3 — AB (ref 2.0–3.0)

## 2018-08-26 NOTE — Patient Instructions (Signed)
Description   Take 2 tablets today, then start taking 1.5 tablets daily.  Recheck INR in 1 week.  Call the Coumadin clinic with any questions #[phone].

## 2018-08-27 ENCOUNTER — Encounter: Payer: Self-pay | Admitting: Physical Therapy

## 2018-08-27 ENCOUNTER — Ambulatory Visit: Payer: Managed Care, Other (non HMO) | Admitting: Occupational Therapy

## 2018-08-27 ENCOUNTER — Ambulatory Visit: Payer: Managed Care, Other (non HMO) | Admitting: Physical Therapy

## 2018-08-27 DIAGNOSIS — R278 Other lack of coordination: Secondary | ICD-10-CM

## 2018-08-27 DIAGNOSIS — I69354 Hemiplegia and hemiparesis following cerebral infarction affecting left non-dominant side: Secondary | ICD-10-CM

## 2018-08-27 DIAGNOSIS — R2689 Other abnormalities of gait and mobility: Secondary | ICD-10-CM

## 2018-08-27 DIAGNOSIS — M6281 Muscle weakness (generalized): Secondary | ICD-10-CM | POA: Diagnosis not present

## 2018-08-27 DIAGNOSIS — R2681 Unsteadiness on feet: Secondary | ICD-10-CM

## 2018-08-27 DIAGNOSIS — R293 Abnormal posture: Secondary | ICD-10-CM

## 2018-08-27 NOTE — Therapy (Signed)
[facility] [address] [place], [place], [address] Phone: [phone]   Fax:  [phone]  Occupational Therapy Treatment  Patient Details  Name: Karl [name] MRN: [id number] Date of Birth: 07-01-1956 Referring Provider: Faith Rogue, MD   Encounter Date: 08/27/2018  OT End of Session - 08/27/18 1050    Visit Number  10    Number of Visits  17    Date for OT Re-Evaluation  09/08/18    Authorization Type  [employer]    Authorization - Visit Number  9    Authorization - Number of Visits  30       Past Medical History:  Diagnosis Date  . Acute CVA (cerebrovascular accident) (HCC) 06/14/2018  . Coronary artery disease    blockage   Stent Dr. Ladona Ridgel 15 years 1998  . Dyspnea   . Hypertension   . Stroke [facility]) 2019   denies residual on 06/14/2018  . Type II diabetes mellitus (HCC) 07/2014 dx    Past Surgical History:  Procedure Laterality Date  . CORONARY ANGIOPLASTY WITH STENT PLACEMENT  1998  . FRACTURE SURGERY    . GREEN LIGHT LASER TURP (TRANSURETHRAL RESECTION OF PROSTATE N/A 06/05/2018   Procedure: GREEN LIGHT LASER TURP , TRANSURETHRAL RESECTION OF PROSTATE;  Surgeon: Crista Elliot, MD;  [facility];  Service: Urology;  Laterality: N/A;  . TIBIA FRACTURE SURGERY Left 1980s    There were no vitals filed for this visit.  Subjective Assessment - 08/27/18 1050    Pertinent History  CVA 06/14/18, T2DM, CAD with ICM on chronic coumadin, prior CVA 12/2017, TURP 06/05/18 for BPH    Patient Stated Goals  regain independence    Currently in Pain?  No/denies               Treatment: supine closed chain shoulder flexion and chest press with min facilitation/ v.c   followed by open chain mid range reaching in standing with LUE.to place large pegs into pegboard, min difficulty, 1 rest break required Therapist checked 9 hole peg test, pt did not meet goal yet.            OT Short Term Goals -  08/27/18 1051      OT SHORT TERM GOAL #1   Title  I with inital HEP    Status  On-going      OT SHORT TERM GOAL #2   Title  Pt will perform all basic ADLs modified independently.    Status  Achieved      OT SHORT TERM GOAL #3   Title  Pt will demonstrate improved fine motor coordination for ADLS as evidenced by decreasing 9 hole peg test score to 75 secs or less.    Status  On-going   79.29 secs     OT SHORT TERM GOAL #4   Title  Pt will perfrom snack prep/ basic home management with supervision.    Status  On-going      OT SHORT TERM GOAL #5   Title  Pt will verbalize understanding of compensatory strategiues for short term memory deficits.    Status  On-going        OT Long Term Goals - 07/10/18 1850      OT LONG TERM GOAL #1   Title  I with updated HEP.    Time  8    Period  Weeks    Status  New    Target Date  09/08/18  OT LONG TERM GOAL #2   Title  Pt will perform basic home managmeent and cooking at a modified independent level demonstrating good safety awareness.    Time  8    Period  Weeks    Status  New      OT LONG TERM GOAL #3   Title  Pt will navigate a busy environment and locate items with 90% or better accuracy.    Time  8    Period  Weeks    Status  New      OT LONG TERM GOAL #4   Title  Pt will demonstrate improved fine motor coordination for ADLs as evidenced by decreasing 9 hole peg test score to 60 secs or less.    Time  8    Period  Weeks    Status  New      OT LONG TERM GOAL #5   Title  Pt will demonstrate improved LUE strength as evidenced by retrieving a 3 lbs item from overhead shelf at 130 without drops.    Time  8    Period  Weeks    Status  New            Plan - 08/27/18 1624    Clinical Impression Statement  Pt is progressing towards goals with improving LUE functional use.    Current Impairments/barriers affecting progress:  cognitive and visual perceptual deficits, decreased balance    OT Frequency  --   16  visits   OT Duration  12 weeks    Plan  Continue to address LUE neuro re-ed, visual perceptual skills, attention to Lt hand/side    Consulted and Agree with Plan of Care  Patient       Patient will benefit from skilled therapeutic intervention in order to improve the following deficits and impairments:  Abnormal gait, Decreased balance, Decreased endurance, Decreased mobility, Impaired vision/preception, Impaired perceived functional ability, Decreased range of motion, Decreased knowledge of precautions, Decreased cognition, Decreased activity tolerance, Decreased coordination, Decreased knowledge of use of DME, Decreased safety awareness, Decreased strength, Impaired UE functional use  Visit Diagnosis: Muscle weakness (generalized)  Hemiplegia and hemiparesis following cerebral infarction affecting left non-dominant side (HCC)  Other lack of coordination  Other abnormalities of gait and mobility  Abnormal posture    Problem List Patient Active Problem List   Diagnosis Date Noted  . 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 Chronic anticoagulation   . Acute ischemic right PCA stroke (HCC) 06/18/2018  . CVA (cerebral vascular accident) (HCC) 06/14/2018  . Urinary retention 06/05/2018  . Encounter for therapeutic drug monitoring 02/08/2018  . History of CVA (cerebrovascular accident) 12/20/2017  . Coronary artery disease 12/19/2017  . Mild renal insufficiency 12/19/2017  . TIA (transient ischemic attack) 12/19/2017  . Hip flexor tendinitis 05/22/2017  . Pain of both hip joints 04/25/2017  . Dyslipidemia 09/01/2015  . BPH without urinary obstruction 09/01/2015  . DM (diabetes mellitus), type 2, uncontrolled, periph vascular complic (HCC) 07/10/2014  . Essential hypertension 04/20/2009  . Cardiomyopathy, ischemic 04/20/2009  . LEFT BUNDLE BRANCH BLOCK 04/20/2009  . Chronic systolic heart failure (HCC) 04/20/2009    RINE,KATHRYN 08/27/2018, 4:24 PM  [facility] [address] [place], [place], [address] Phone: [phone]   Fax:  [phone]  Name: Karl [name] MRN: [id number] Date of Birth: [birth year]

## 2018-08-28 NOTE — Therapy (Signed)
[facility] [address] [place], [place], [address] Phone: [phone]   Fax:  [phone]  Physical Therapy Treatment  Patient Details  Name: Karl [name] MRN: [id number] Date of Birth: 1956/05/12 Referring Provider: Alger Simons, MD   Encounter Date: 08/27/2018     08/27/18 1106  PT Visits / Re-Eval  Visit Number 7  Number of Visits 30  Date for PT Re-Evaluation 11/15/18  Authorization  Authorization Type [employer] [id number] oop met, 100% coverage; VL PT & speech 30 visit limit hard max zero used before eval.  PT Time Calculation  PT Start Time 1104  PT Stop Time 1145  PT Time Calculation (min) 41 min  PT - End of Session  Equipment Utilized During Treatment Gait belt  Activity Tolerance Patient tolerated treatment well  Behavior During Therapy [facility] for tasks assessed/performed    Past Medical History:  Diagnosis Date  . Acute CVA (cerebrovascular accident) ([place]) 06/14/2018  . Coronary artery disease    blockage   Stent Dr. Lovena Le 15 years 1998  . Dyspnea   . Hypertension   . Stroke [facility]) 2019   denies residual on 06/14/2018  . Type II diabetes mellitus ([place]) 07/2014 dx    Past Surgical History:  Procedure Laterality Date  . CORONARY ANGIOPLASTY WITH STENT PLACEMENT  1998  . FRACTURE SURGERY    . GREEN LIGHT LASER TURP (TRANSURETHRAL RESECTION OF PROSTATE N/A 06/05/2018   Procedure: GREEN LIGHT LASER TURP , TRANSURETHRAL RESECTION OF PROSTATE;  Surgeon: [name], MD;  Location: [facility];  Service: Urology;  Laterality: N/A;  . TIBIA FRACTURE SURGERY Left 1980s    There were no vitals filed for this visit.     08/27/18 1105  Symptoms/Limitations  Subjective No new complaints. No falls or pain to report.   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 No/denies  Pain Score 0      08/27/18 1108  Transfers  Transfers Sit to Stand;Stand to Sit  Sit to Stand 5: Supervision;With upper extremity assist;From bed;From chair/3-in-1  Stand to Sit 5: Supervision;With upper extremity assist;To bed;To chair/3-in-1  Ambulation/Gait  Ambulation/Gait Yes  Ambulation/Gait Assistance 5: Supervision;4: Min guard;4: Min assist;3: Mod assist  Ambulation/Gait Assistance Details cues on posture. pt tends to stay close to left side of walker, kicking it at times. This along with increased episodes of scissoring with fatigue required min guard assist for balance at times. no significant balance loss occured. transitioned to straight cane with rubber quad tip for gait around gym with stairs/counter balance activities with up to mod assist at times for balance along with cues on posture, step length, cane placement, weight shifting and to relax left UE.    Ambulation Distance (Feet) 345 Feet (x1 with RW; around gym with cane w/ max distance 50 x1)  Assistive device Rolling walker;Straight cane  Gait Pattern Step-through pattern;Decreased step length - right;Decreased stance time - left;Decreased hip/knee flexion - left;Decreased weight shift to left;Left hip hike;Left steppage;Ataxic;Antalgic;Lateral hip instability;Trunk flexed;Abducted - left;Poor foot clearance - left  Ambulation Surface Level;Indoor  Stairs Yes  Stairs Assistance 4: Min guard  Stairs Assistance Details (indicate cue type and reason) reminder cues on sequencing, weight shifting and for hand advancement along rail  Stair Management Technique One rail Right;One rail Left;Step to pattern;Forwards;With cane  Number of Stairs 4  Height of Stairs 6  High Level Balance  High Level Balance Activities Side stepping;Marching forwards;Marching backwards  High Level Balance Comments at  counter: side stepping with bil UE support for 3 laps toward each way, then fwd/bwd marching with right UE support only for 3 laps each way. min assist with cues on posture, ex form and weight shifting.    Therapeutic Activites   Therapeutic Activities Other Therapeutic Activities  Other Therapeutic Activities standing with RW support: pt was able to reach 10 inches forward with UE support, to the floor/back up and look over both shoulders with supervision assist only.          PT Short Term Goals - 08/27/18 1106      PT SHORT TERM GOAL #1   Title  Patient demonstrates understanding of initial HEP. (All STGs Target Date: 08/30/2018)    Time  1    Period  Months    Status  On-going      PT SHORT TERM GOAL #2   Title  Patient ambulates 300' with RW with supervision & scans environment with loss of balance.     Baseline  08/27/18: met today    Time  --    Period  --    Status  Achieved      PT SHORT TERM GOAL #3   Title  Patient negotiates ramps & curbs with RW and stairs with 1 rail & cane with min guard.     Baseline  08/27/18: met the ramp/curb with previous PT session per note, met stair portion today.     Time  --    Period  --    Status  Achieved      PT SHORT TERM GOAL #4   Title  Patient performs standing balance with RW support: reaching 10" anteriorly & to floor, looks over shoulders with supervision.     Baseline  08/27/18: met today    Time  --    Period  --    Status  Achieved           PT Long Term Goals - 07/29/18 2117      PT LONG TERM GOAL #1   Title  Patient demonstrates & verbalizes ongoing HEP / fitness plan with medical issues. (All LTGs Target Date: 11/15/2018)    Time  15    Period  Weeks    Status  New    Target Date  11/15/18      PT LONG TERM GOAL #2   Title  Berg Balance >45/56 to indicate lower fall risk.     Time  15    Period  Weeks    Status  New    Target Date  11/15/18      PT LONG TERM GOAL #3   Title  Timed Up & Go with cane or  less <30 sec safely.    Time  15    Period  Weeks    Status  New    Target Date  11/15/18      PT LONG TERM GOAL #4   Title  Dynamic Gait Index with cane >12/24 to indicate lower fall risk.     Time  15    Period  Weeks    Status  New    Target Date  11/15/18      PT LONG TERM GOAL #5  Title  Patient ambulates 400' with cane modified independent for community mobility.     Time  15    Period  Weeks    Status  New    Target Date  11/15/18      PT LONG TERM GOAL #6   Title  Patient negotiates ramps, curbs & stairs single rail with cane modified independent for community access.    Time  15    Period  Weeks    Status  New    Target Date  11/15/18           08/27/18 1106  Plan  Clinical Impression Statement Today's skilled session focused on progress toward STGs with goals partially to fully met that were checked today. Also continued to address gait with cane and balance reactions without any issues reported or noted. The pt is progressing and should benefit from continued PT to progress toward unmet goals.   Pt will benefit from skilled therapeutic intervention in order to improve on the following deficits Abnormal gait;Decreased activity tolerance;Decreased balance;Decreased coordination;Decreased endurance;Decreased knowledge of use of DME;Decreased mobility;Decreased strength;Dizziness;Impaired flexibility;Impaired sensation;Postural dysfunction  Rehab Potential Good  Clinical Impairments Affecting Rehab Potential Right CVA 06/14/2018, Left CVA Jan 2019, CAD, CABG, cardiomyopathy, HTN, tibia fx with surgery, DM2,   PT Frequency 2x / week  PT Duration Other (comment) (15 weeks)  PT Treatment/Interventions ADLs/Self Care Home Management;Canalith Repostioning;DME Instruction;Gait training;Stair training;Functional mobility training;Therapeutic activities;Therapeutic exercise;Balance training;Neuromuscular re-education;Patient/family education;Orthotic Fit/Training;Manual  techniques;Vestibular  PT Next Visit Plan check remaining STG; continue with balance training with emphasis on left LE weight bearing/strengthening, gait with cane  PT Home Exercise Plan [email or web address]   Consulted and Agree with Plan of Care Patient;Family member/caregiver  Family Member Consulted dtr, [name]        Patient will benefit from skilled therapeutic intervention in order to improve the following deficits and impairments:  Abnormal gait, Decreased activity tolerance, Decreased balance, Decreased coordination, Decreased endurance, Decreased knowledge of use of DME, Decreased mobility, Decreased strength, Dizziness, Impaired flexibility, Impaired sensation, Postural dysfunction  Visit Diagnosis: Muscle weakness (generalized)  Hemiplegia and hemiparesis following cerebral infarction affecting left non-dominant side (HCC)  Other abnormalities of gait and mobility  Abnormal posture  Unsteadiness on feet     Problem List Patient Active Problem List   Diagnosis Date Noted  . 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 Chronic anticoagulation   . Acute ischemic right PCA stroke ([place]) 06/18/2018  . CVA (cerebral vascular accident) ([place]) 06/14/2018  . Urinary retention 06/05/2018  . Encounter for therapeutic drug monitoring 02/08/2018  . History of CVA (cerebrovascular accident) 12/20/2017  . Coronary artery disease 12/19/2017  . Mild renal insufficiency 12/19/2017  . TIA (transient ischemic attack) 12/19/2017  . Hip flexor tendinitis 05/22/2017  . Pain of both hip joints 04/25/2017  . Dyslipidemia 09/01/2015  . BPH without urinary obstruction 09/01/2015  . DM (diabetes mellitus), type 2, uncontrolled, periph vascular complic ([place]) 16/09/9603  . Essential hypertension 04/20/2009  . Cardiomyopathy, ischemic 04/20/2009  . LEFT BUNDLE BRANCH BLOCK 04/20/2009  . Chronic systolic heart failure ([place]) 04/20/2009    Willow Ora, PTA, [place] [address] [place], [place] [address] [phone] 08/28/18, 10:00 PM   Name: JEDEDIAH NODA MRN: [id number] Date of Birth: [birth year]

## 2018-08-29 ENCOUNTER — Ambulatory Visit: Payer: Managed Care, Other (non HMO) | Admitting: Occupational Therapy

## 2018-08-29 ENCOUNTER — Encounter: Payer: Self-pay | Admitting: Physical Therapy

## 2018-08-29 ENCOUNTER — Ambulatory Visit: Payer: Managed Care, Other (non HMO) | Admitting: Physical Therapy

## 2018-08-29 VITALS — BP 135/82

## 2018-08-29 DIAGNOSIS — R2681 Unsteadiness on feet: Secondary | ICD-10-CM

## 2018-08-29 DIAGNOSIS — I69354 Hemiplegia and hemiparesis following cerebral infarction affecting left non-dominant side: Secondary | ICD-10-CM

## 2018-08-29 DIAGNOSIS — M6281 Muscle weakness (generalized): Secondary | ICD-10-CM

## 2018-08-29 DIAGNOSIS — R41842 Visuospatial deficit: Secondary | ICD-10-CM

## 2018-08-29 DIAGNOSIS — R293 Abnormal posture: Secondary | ICD-10-CM

## 2018-08-29 DIAGNOSIS — R2689 Other abnormalities of gait and mobility: Secondary | ICD-10-CM

## 2018-08-29 DIAGNOSIS — R278 Other lack of coordination: Secondary | ICD-10-CM

## 2018-08-29 NOTE — Therapy (Addendum)
[facility] [address] [place], [place], [address] Phone: [phone]   Fax:  [phone]  Occupational Therapy Treatment  Patient Details  Name: Karl [name] MRN: [id number] Date of Birth: 1956/05/18 Referring Provider: Faith Rogue, MD   Encounter Date: 08/29/2018  OT End of Session - 08/29/18 1057    Visit Number  11    Number of Visits  17    Date for OT Re-Evaluation  09/08/18    Authorization Type  [employer]    Authorization - Visit Number  10    Authorization - Number of Visits  30    OT Start Time  1020    OT Stop Time  1100    OT Time Calculation (min)  40 min    Activity Tolerance  Patient tolerated treatment well    Behavior During Therapy  [facility] for tasks assessed/performed       Past Medical History:  Diagnosis Date  . Acute CVA (cerebrovascular accident) ([facility]) 06/14/2018  . Coronary artery disease    blockage   Stent Dr. Ladona Ridgel 15 years 1998  . Dyspnea   . Hypertension   . Stroke [facility]) 2019   denies residual on 06/14/2018  . Type II diabetes mellitus ([facility]) 07/2014 dx    Past Surgical History:  Procedure Laterality Date  . CORONARY ANGIOPLASTY WITH STENT PLACEMENT  1998  . FRACTURE SURGERY    . GREEN LIGHT LASER TURP (TRANSURETHRAL RESECTION OF PROSTATE N/A 06/05/2018   Procedure: GREEN LIGHT LASER TURP , TRANSURETHRAL RESECTION OF PROSTATE;  Surgeon: Crista Elliot, MD;  Location: [facility];  Service: Urology;  Laterality: N/A;  . TIBIA FRACTURE SURGERY Left 1980s    There were no vitals filed for this visit.  Subjective Assessment - 08/29/18 1109    Subjective   Pt reports he is interested in walker tray    Pertinent History  CVA 06/14/18, T2DM, CAD with ICM on chronic coumadin, prior CVA 12/2017, TURP 06/05/18 for BPH    Patient Stated Goals  regain independence    Currently in Pain?  No/denies              Treatment: Pt was educated regarding walker tray use. Pt was able to  ambulate using walker tray to retrieve items to carry to table then he sat down to make a peanut butter and jelly sandwich. Pt was able to complete task and cut sandwich in half without assistance. Pt then ambulated with supervision to put items away using walker tray. Seated pt copied a small peg design seated  with LUE min difficulty and increased time required.               OT Short Term Goals - 08/29/18 1107      OT SHORT TERM GOAL #1   Title  I with inital HEP    Status  On-going      OT SHORT TERM GOAL #2   Title  Pt will perform all basic ADLs modified independently.    Status  Achieved      OT SHORT TERM GOAL #3   Title  Pt will demonstrate improved fine motor coordination for ADLS as evidenced by decreasing 9 hole peg test score to 75 secs or less.    Status  On-going   79.29 secs     OT SHORT TERM GOAL #4   Title  Pt will perfrom snack prep/ basic home management with supervision.    Status  Achieved   using walker tray, pt was provided info for purchase.     OT SHORT TERM GOAL #5   Title  Pt will verbalize understanding of compensatory strategiues for short term memory deficits.    Status  On-going        OT Long Term Goals - 07/10/18 1850      OT LONG TERM GOAL #1   Title  I with updated HEP.    Time  8    Period  Weeks    Status  New    Target Date  09/08/18      OT LONG TERM GOAL #2   Title  Pt will perform basic home managmeent and cooking at a modified independent level demonstrating good safety awareness.    Time  8    Period  Weeks    Status  New      OT LONG TERM GOAL #3   Title  Pt will navigate a busy environment and locate items with 90% or better accuracy.    Time  8    Period  Weeks    Status  New      OT LONG TERM GOAL #4   Title  Pt will demonstrate improved fine motor coordination for ADLs as evidenced by decreasing 9 hole peg test score to 60 secs or less.    Time  8    Period  Weeks    Status  New      OT LONG TERM  GOAL #5   Title  Pt will demonstrate improved LUE strength as evidenced by retrieving a 3 lbs item from overhead shelf at 130 without drops.    Time  8    Period  Weeks    Status  New            Plan - 08/29/18 1105    Clinical Impression Statement  Prt is progressing towards goals. He demonstrates improving functional mobility for ADLS. Pt was able to use walker tray to safely carry items o maek a sandwich.    Occupational Profile and client history currently impacting functional performance   Pt was completely I and working full time prior to CVA, currently he is unable to work. CVA 06/14/18,Type II DM, CAD with ICM on chronic coumadin, prior CVA 12/2017, TURP 06/05/18 for BPH     Occupational performance deficits (Please refer to evaluation for details):  ADL's;IADL's;Work;Leisure;Social Participation    Rehab Potential  Good    Current Impairments/barriers affecting progress:  cognitive and visual perceptual deficits, decreased balance    OT Frequency  --   16 visits   OT Duration  12 weeks    OT Treatment/Interventions  Self-care/ADL training;Paraffin;Therapeutic exercise;DME and/or AE instruction;Functional Mobility Training;Cognitive remediation/compensation;Balance training;Manual Therapy;Neuromuscular education;Gait Training;Fluidtherapy;Ultrasound;Aquatic Therapy;Moist Heat;Contrast Bath;Energy conservation;Passive range of motion;Therapeutic activities;Patient/family education    Plan  review HERP and memeory strategies, LUE neuro re-ed, visual perceptual skills, attention to Lt hand/side    Consulted and Agree with Plan of Care  Patient       Patient will benefit from skilled therapeutic intervention in order to improve the following deficits and impairments:  Abnormal gait, Decreased balance, Decreased endurance, Decreased mobility, Impaired vision/preception, Impaired perceived functional ability, Decreased range of motion, Decreased knowledge of precautions, Decreased  cognition, Decreased activity tolerance, Decreased coordination, Decreased knowledge of use of DME, Decreased safety awareness, Decreased strength, Impaired UE functional use  Visit Diagnosis: Muscle weakness (generalized)  Hemiplegia and hemiparesis following cerebral infarction affecting left  non-dominant side (HCC)  Other abnormalities of gait and mobility  Unsteadiness on feet  Other lack of coordination  Visuospatial deficit    Problem List Patient Active Problem List   Diagnosis Date Noted  . 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 Chronic anticoagulation   . Acute ischemic right PCA stroke (H[facility]) 06/18/2018  . CVA (cerebral vascular accident) (HCC) 06/14/2018  . Urinary retention 06/05/2018  . Encounter for therapeutic drug monitoring 02/08/2018  . History of CVA (cerebrovascular accident) 12/20/2017  . Coronary artery disease 12/19/2017  . Mild renal insufficiency 12/19/2017  . TIA (transient ischemic attack) 12/19/2017  . Hip flexor tendinitis 05/22/2017  . Pain of both hip joints 04/25/2017  . Dyslipidemia 09/01/2015  . BPH without urinary obstruction 09/01/2015  . DM (diabetes mellitus), type 2, uncontrolled, periph vascular complic (HCC) 07/10/2014  . Essential hypertension 04/20/2009  . Cardiomyopathy, ischemic 04/20/2009  . LEFT BUNDLE BRANCH BLOCK 04/20/2009  . Chronic systolic heart failure (HCC) 04/20/2009    RINE,KATHRYN 08/29/2018, 12:08 PM  [facility] [address] [place], [place], [address] Phone: [phone]   Fax:  [phone]  Name: Karl [name] MRN: [id number] Date of Birth: 23-Dec-1955

## 2018-08-29 NOTE — Therapy (Signed)
[facility] [name] [place], [place], [address] Phone: [phone]   Fax:  [phone]  Physical Therapy Treatment  Patient Details  Name: Karl [name] MRN: [id number] Date of Birth: [birth year] Referring Provider (PT): Alger Simons, MD   Encounter Date: 08/29/2018  PT End of Session - 08/29/18 1102    Visit Number  8    Number of Visits  30    Date for PT Re-Evaluation  11/15/18    Authorization Type  [employer] [id number] oop met, 100% coverage; VL PT & speech 30 visit limit hard max zero used before eval.    PT Start Time  1100    PT Stop Time  1145    PT Time Calculation (min)  45 min    Equipment Utilized During Treatment  Gait belt    Activity Tolerance  Patient tolerated treatment well;No increased pain    Behavior During Therapy  WFL for tasks assessed/performed       Past Medical History:  Diagnosis Date  . Acute CVA (cerebrovascular accident) ([place]) 06/14/2018  . Coronary artery disease    blockage   Stent Dr. Lovena Le 15 years 1998  . Dyspnea   . Hypertension   . Stroke [facility] 2019   denies residual on 06/14/2018  . Type II diabetes mellitus ([place]) 07/2014 dx    Past Surgical History:  Procedure Laterality Date  . CORONARY ANGIOPLASTY WITH STENT PLACEMENT  1998  . FRACTURE SURGERY    . GREEN LIGHT LASER TURP (TRANSURETHRAL RESECTION OF PROSTATE N/A 06/05/2018   Procedure: GREEN LIGHT LASER TURP , TRANSURETHRAL RESECTION OF PROSTATE;  Surgeon: [name], MD;  Location: [facility];  Service: Urology;  Laterality: N/A;  . TIBIA FRACTURE SURGERY Left 1980s    Vitals:   08/29/18 1101  BP: 135/82    Subjective Assessment - 08/29/18 1101    Subjective  No new complaints. No falls or pain to report.     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  No/denies    Pain Score  0-No pain           [facility] Adult PT Treatment/Exercise - 08/29/18 1118      Neuro Re-ed    Neuro Re-ed Details   for balance/muscle re-ed/coordination: at bottom of steps with left UE support on rail- right stance on floor with left LE performing foot/toe taps up bottom 3 steps/then down the 3 steps with emphasis on hip/knee flexion and not "sliding " his foot along. performed 10 reps with min guard assit, cues on posture, ex technique and weight shifitng. After a short rest performed same task in left stance with emphasis on knee controll in stance with right LE performing 10 reps as well. min guard assist with cues on left LE soft knee, right LE hip/knee flexion.                       Exercises   Exercises  Other Exercises    Other Exercises   pt performed all ex's from HEP in session today for 5-10 reps each with cues needed on  correct ex form/technique. performed seated left hamstring stretch with strap for 30 sec's x 1 rep wtih no cues needed.           Balance Exercises - 08/29/18 1129      Balance Exercises: Standing   SLS with Vectors  Solid surface;Other reps (comment);Limitations    Balance Beam  standing across red foam beam with no UE support, feet hip width apart: EC no head movements for 30 seconds for 3 reps. min guard to min assist for balance with cues on posture and weight shifting.        Balance Exercises: Standing   SLS with Vectors Limitations  2 tall cones on floor: alternating fwd toe taps, progressing to alternating cross toe taps. emphasis on/cues for/facilitaiton for stance position, to slow down, for weight shifting, posture and to visualize the target before lifting left LE to assist with tapping on 1st try due to ataxia. min to mod assist for balance.                         PT Short Term Goals - 08/29/18 1102      PT SHORT TERM GOAL #1   Title  Patient demonstrates understanding of initial HEP. (All STGs Target Date: 08/30/2018)    Time  1    Period  Months    Status  On-going      PT SHORT TERM GOAL #2   Title  Patient ambulates 300' with RW with supervision & scans environment with loss of balance.     Baseline  08/27/18: met today    Status  Achieved      PT SHORT TERM GOAL #3   Title  Patient negotiates ramps & curbs with RW and stairs with 1 rail & cane with min guard.     Baseline  08/27/18: met the ramp/curb with previous PT session per note, met stair portion today.     Status  Achieved      PT SHORT TERM GOAL #4   Title  Patient performs standing balance with RW support: reaching 10" anteriorly & to floor, looks over shoulders with supervision.     Baseline  08/27/18: met today    Status  Achieved        PT Long Term Goals - 07/29/18 2117      PT LONG TERM GOAL #1   Title  Patient demonstrates & verbalizes ongoing HEP / fitness plan with medical issues. (All LTGs Target Date: 11/15/2018)    Time  15    Period  Weeks    Status  New    Target Date  11/15/18      PT LONG TERM GOAL #2   Title  Berg Balance >45/56 to indicate lower fall risk.     Time  15    Period  Weeks    Status  New    Target Date  11/15/18      PT LONG TERM GOAL #3   Title  Timed Up & Go with cane or less <30 sec safely.    Time  15    Period  Weeks    Status  New    Target Date  11/15/18      PT LONG TERM GOAL #4   Title  Dynamic Gait Index with cane >12/24 to indicate lower fall risk.     Time  15    Period  Weeks    Status  New  Target Date  11/15/18      PT LONG TERM GOAL #5   Title  Patient ambulates 400' with cane modified independent for community mobility.     Time  15    Period  Weeks    Status  New    Target Date  11/15/18      PT LONG TERM GOAL #6   Title  Patient negotiates ramps, curbs & stairs single rail with cane  modified independent for community access.    Time  15    Period  Weeks    Status  New    Target Date  11/15/18            Plan - 08/29/18 1102    Clinical Impression Statement  Today's skilled session initially addressed remaining STG with minimal cues needed on correct ex form/technique. Remainder of session continued to focus on LE strengthening, proprioception and balance reactions. He continues to have decr left LE stance stability and left LE ataxia with movements. With skilled instruction he was able to progress to less assistance for balance as the tasks progressed. The pt continues to progress and should benefit from continued PT to progress toward goals.                        Rehab Potential  Good    Clinical Impairments Affecting Rehab Potential  Right CVA 06/14/2018, Left CVA Jan 2019, CAD, CABG, cardiomyopathy, HTN, tibia fx with surgery, DM2,     PT Frequency  2x / week    PT Duration  Other (comment)   15 weeks   PT Treatment/Interventions  ADLs/Self Care Home Management;Canalith Repostioning;DME Instruction;Gait training;Stair training;Functional mobility training;Therapeutic activities;Therapeutic exercise;Balance training;Neuromuscular re-education;Patient/family education;Orthotic Fit/Training;Manual techniques;Vestibular    PT Next Visit Plan   continue with balance training with emphasis on left LE weight bearing/strengthening, gait with cane    PT Home Exercise Plan  [id number]     Consulted and Agree with Plan of Care  Patient;Family member/caregiver    Family Member Consulted  dtr, [name]       Patient will benefit from skilled therapeutic intervention in order to improve the following deficits and impairments:  Abnormal gait, Decreased activity tolerance, Decreased balance, Decreased coordination, Decreased endurance, Decreased knowledge of use of DME, Decreased mobility, Decreased strength, Dizziness, Impaired flexibility, Impaired sensation, Postural  dysfunction  Visit Diagnosis: Muscle weakness (generalized)  Hemiplegia and hemiparesis following cerebral infarction affecting left non-dominant side (HCC)  Other abnormalities of gait and mobility  Unsteadiness on feet  Abnormal posture     Problem List Patient Active Problem List   Diagnosis Date Noted  . 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 Chronic anticoagulation   . Acute ischemic right PCA stroke ([place]) 06/18/2018  . CVA (cerebral vascular accident) ([place]) 06/14/2018  . Urinary retention 06/05/2018  . Encounter for therapeutic drug monitoring 02/08/2018  . History of CVA (cerebrovascular accident) 12/20/2017  . Coronary artery disease 12/19/2017  . Mild renal insufficiency 12/19/2017  . TIA (transient ischemic attack) 12/19/2017  . Hip flexor tendinitis 05/22/2017  . Pain of both hip joints 04/25/2017  . Dyslipidemia 09/01/2015  . BPH without urinary obstruction 09/01/2015  . DM (diabetes mellitus), type 2, uncontrolled, periph vascular complic ([place]) 29/57/4734  . Essential hypertension 04/20/2009  . Cardiomyopathy, ischemic 04/20/2009  . LEFT BUNDLE BRANCH BLOCK 04/20/2009  . Chronic systolic heart failure ([place]) 04/20/2009    Willow Ora, PTA, [facility]  [address] [place], [place] [address] [phone] 08/29/18, 10:17 PM   Name: Karl [name] MRN: [id number] Date of Birth: 08/21/1956

## 2018-09-01 ENCOUNTER — Other Ambulatory Visit: Payer: Self-pay | Admitting: Internal Medicine

## 2018-09-02 ENCOUNTER — Ambulatory Visit (INDEPENDENT_AMBULATORY_CARE_PROVIDER_SITE_OTHER): Payer: Managed Care, Other (non HMO)

## 2018-09-02 DIAGNOSIS — Z5181 Encounter for therapeutic drug level monitoring: Secondary | ICD-10-CM | POA: Diagnosis not present

## 2018-09-02 DIAGNOSIS — G459 Transient cerebral ischemic attack, unspecified: Secondary | ICD-10-CM | POA: Diagnosis not present

## 2018-09-02 DIAGNOSIS — Z8673 Personal history of transient ischemic attack (TIA), and cerebral infarction without residual deficits: Secondary | ICD-10-CM

## 2018-09-02 LAB — POCT INR: INR: 2.7 (ref 2.0–3.0)

## 2018-09-02 NOTE — Patient Instructions (Signed)
Description   Continue on same dosage 1.5 tablets daily.  Recheck INR in 1 week.  Call the Coumadin clinic with any questions #[phone].

## 2018-09-03 ENCOUNTER — Ambulatory Visit: Payer: Managed Care, Other (non HMO) | Attending: Physical Medicine & Rehabilitation | Admitting: Occupational Therapy

## 2018-09-03 ENCOUNTER — Ambulatory Visit: Payer: Managed Care, Other (non HMO) | Admitting: Physical Therapy

## 2018-09-03 DIAGNOSIS — R41842 Visuospatial deficit: Secondary | ICD-10-CM | POA: Insufficient documentation

## 2018-09-03 DIAGNOSIS — R293 Abnormal posture: Secondary | ICD-10-CM | POA: Insufficient documentation

## 2018-09-03 DIAGNOSIS — I69315 Cognitive social or emotional deficit following cerebral infarction: Secondary | ICD-10-CM | POA: Insufficient documentation

## 2018-09-03 DIAGNOSIS — M6281 Muscle weakness (generalized): Secondary | ICD-10-CM | POA: Insufficient documentation

## 2018-09-03 DIAGNOSIS — R2689 Other abnormalities of gait and mobility: Secondary | ICD-10-CM | POA: Insufficient documentation

## 2018-09-03 DIAGNOSIS — R278 Other lack of coordination: Secondary | ICD-10-CM | POA: Insufficient documentation

## 2018-09-03 DIAGNOSIS — R2681 Unsteadiness on feet: Secondary | ICD-10-CM | POA: Insufficient documentation

## 2018-09-03 DIAGNOSIS — I69354 Hemiplegia and hemiparesis following cerebral infarction affecting left non-dominant side: Secondary | ICD-10-CM | POA: Insufficient documentation

## 2018-09-05 ENCOUNTER — Encounter: Payer: Managed Care, Other (non HMO) | Admitting: Occupational Therapy

## 2018-09-05 ENCOUNTER — Ambulatory Visit: Payer: Managed Care, Other (non HMO) | Admitting: Physical Therapy

## 2018-09-09 ENCOUNTER — Ambulatory Visit (INDEPENDENT_AMBULATORY_CARE_PROVIDER_SITE_OTHER): Payer: Managed Care, Other (non HMO)

## 2018-09-09 ENCOUNTER — Other Ambulatory Visit: Payer: Self-pay | Admitting: Internal Medicine

## 2018-09-09 DIAGNOSIS — Z5181 Encounter for therapeutic drug level monitoring: Secondary | ICD-10-CM

## 2018-09-09 DIAGNOSIS — Z8673 Personal history of transient ischemic attack (TIA), and cerebral infarction without residual deficits: Secondary | ICD-10-CM

## 2018-09-09 DIAGNOSIS — G459 Transient cerebral ischemic attack, unspecified: Secondary | ICD-10-CM | POA: Diagnosis not present

## 2018-09-09 LAB — POCT INR: INR: 3.5 — AB (ref 2.0–3.0)

## 2018-09-09 NOTE — Patient Instructions (Signed)
Description   Skip today's dosage of Coumadin, then start taking 1.5 tablets daily except 1 tablet on Fridays.  Recheck INR in 2 weeks.  Call the Coumadin clinic with any questions #[phone].

## 2018-09-10 ENCOUNTER — Encounter: Payer: Managed Care, Other (non HMO) | Admitting: Occupational Therapy

## 2018-09-10 ENCOUNTER — Ambulatory Visit: Payer: Managed Care, Other (non HMO) | Admitting: Physical Therapy

## 2018-09-12 ENCOUNTER — Ambulatory Visit: Payer: Managed Care, Other (non HMO) | Admitting: Occupational Therapy

## 2018-09-12 ENCOUNTER — Encounter: Payer: Self-pay | Admitting: Physical Therapy

## 2018-09-12 ENCOUNTER — Ambulatory Visit: Payer: Managed Care, Other (non HMO) | Admitting: Physical Therapy

## 2018-09-12 DIAGNOSIS — I69354 Hemiplegia and hemiparesis following cerebral infarction affecting left non-dominant side: Secondary | ICD-10-CM

## 2018-09-12 DIAGNOSIS — M6281 Muscle weakness (generalized): Secondary | ICD-10-CM

## 2018-09-12 DIAGNOSIS — R278 Other lack of coordination: Secondary | ICD-10-CM | POA: Diagnosis present

## 2018-09-12 DIAGNOSIS — I69315 Cognitive social or emotional deficit following cerebral infarction: Secondary | ICD-10-CM

## 2018-09-12 DIAGNOSIS — R2689 Other abnormalities of gait and mobility: Secondary | ICD-10-CM | POA: Diagnosis present

## 2018-09-12 DIAGNOSIS — R2681 Unsteadiness on feet: Secondary | ICD-10-CM | POA: Diagnosis present

## 2018-09-12 DIAGNOSIS — R41842 Visuospatial deficit: Secondary | ICD-10-CM

## 2018-09-12 DIAGNOSIS — R293 Abnormal posture: Secondary | ICD-10-CM | POA: Diagnosis present

## 2018-09-12 NOTE — Therapy (Signed)
[facility] [address] [place], [place], [address] Phone: [phone]   Fax:  [phone]  Occupational Therapy Treatment  Patient Details  Name: Karl [name] MRN: [id number] Date of Birth: [birth year] No data recorded  Encounter Date: 09/12/2018  OT End of Session - 09/12/18 1025    Visit Number  12    Number of Visits  17    Date for OT Re-Evaluation  09/26/18   date extended due to missed visits.   Authorization Type  [employer]    Authorization - Visit Number  11    Authorization - Number of Visits  30    OT Start Time  1018    OT Stop Time  1100    OT Time Calculation (min)  42 min    Activity Tolerance  Patient tolerated treatment well    Behavior During Therapy  WFL for tasks assessed/performed       Past Medical History:  Diagnosis Date  . Acute CVA (cerebrovascular accident) ([facility]) 06/14/2018  . Coronary artery disease    blockage   Stent Dr. Ladona Ridgel 15 years 1998  . Dyspnea   . Hypertension   . Stroke [facility]) 2019   denies residual on 06/14/2018  . Type II diabetes mellitus ([facility]) 07/2014 dx    Past Surgical History:  Procedure Laterality Date  . CORONARY ANGIOPLASTY WITH STENT PLACEMENT  1998  . FRACTURE SURGERY    . GREEN LIGHT LASER TURP (TRANSURETHRAL RESECTION OF PROSTATE N/A 06/05/2018   Procedure: GREEN LIGHT LASER TURP , TRANSURETHRAL RESECTION OF PROSTATE;  Surgeon: Crista Elliot, MD;  Location: [facility];  Service: Urology;  Laterality: N/A;  . TIBIA FRACTURE SURGERY Left 1980s    There were no vitals filed for this visit.  Subjective Assessment - 09/12/18 1025    Pertinent History  CVA 06/14/18, T2DM, CAD with ICM on chronic coumadin, prior CVA 12/2017, TURP 06/05/18 for BPH    Patient Stated Goals  regain independence    Currently in Pain?  No/denies            Treatment: Supine reviewed ball exercises for shoulder flexion and chest press, pt returned demonstration. Seated low range  chest press with ball, min v.c/ facilitation. Seated placing and removing various sized pegs from pegboard with LUE min v.c to avoid compensation / min difficulty Arm bike x 5 mins for reciprocal movement                 OT Short Term Goals - 09/12/18 1215      OT SHORT TERM GOAL #1   Title  I with inital HEP    Status  Achieved      OT SHORT TERM GOAL #2   Title  Pt will perform all basic ADLs modified independently.    Status  Achieved      OT SHORT TERM GOAL #3   Title  Pt will demonstrate improved fine motor coordination for ADLS as evidenced by decreasing 9 hole peg test score to 75 secs or less.    Status  On-going   79.29 secs     OT SHORT TERM GOAL #4   Title  Pt will perfrom snack prep/ basic home management with supervision.    Status  Achieved   using walker tray, pt was provided info for purchase.     OT SHORT TERM GOAL #5   Title  Pt will verbalize understanding of compensatory strategiues for short term  memory deficits.    Status  On-going   using calendar and pill box, therapist recommends use of alarms of timers       OT Long Term Goals - 07/10/18 1850      OT LONG TERM GOAL #1   Title  I with updated HEP.    Time  8    Period  Weeks    Status  New    Target Date  09/08/18      OT LONG TERM GOAL #2   Title  Pt will perform basic home managmeent and cooking at a modified independent level demonstrating good safety awareness.    Time  8    Period  Weeks    Status  New      OT LONG TERM GOAL #3   Title  Pt will navigate a busy environment and locate items with 90% or better accuracy.    Time  8    Period  Weeks    Status  New      OT LONG TERM GOAL #4   Title  Pt will demonstrate improved fine motor coordination for ADLs as evidenced by decreasing 9 hole peg test score to 60 secs or less.    Time  8    Period  Weeks    Status  New      OT LONG TERM GOAL #5   Title  Pt will demonstrate improved LUE strength as evidenced by  retrieving a 3 lbs item from overhead shelf at 130 without drops.    Time  8    Period  Weeks    Status  New            Plan - 09/12/18 1216    Clinical Impression Statement  Pt demonstrates good overall progress with improving LUe awareness and functional use    Occupational performance deficits (Please refer to evaluation for details):  ADL's;IADL's;Work;Leisure;Social Participation    Rehab Potential  Good    OT Frequency  --   16 visits,    OT Duration  12 weeks    OT Treatment/Interventions  Self-care/ADL training;Paraffin;Therapeutic exercise;DME and/or AE instruction;Functional Mobility Training;Cognitive remediation/compensation;Balance training;Manual Therapy;Neuromuscular education;Gait Training;Fluidtherapy;Ultrasound;Aquatic Therapy;Moist Heat;Contrast Bath;Energy conservation;Passive range of motion;Therapeutic activities;Patient/family education    Plan   LUE neuro re-ed, visual perceptual skills, attention to Lt hand/side       Patient will benefit from skilled therapeutic intervention in order to improve the following deficits and impairments:  Abnormal gait, Decreased balance, Decreased endurance, Decreased mobility, Impaired vision/preception, Impaired perceived functional ability, Decreased range of motion, Decreased knowledge of precautions, Decreased cognition, Decreased activity tolerance, Decreased coordination, Decreased knowledge of use of DME, Decreased safety awareness, Decreased strength, Impaired UE functional use  Visit Diagnosis: Muscle weakness (generalized)  Hemiplegia and hemiparesis following cerebral infarction affecting left non-dominant side (HCC)  Visuospatial deficit  Cognitive social or emotional deficit following cerebral infarction    Problem List Patient Active Problem List   Diagnosis Date Noted  . 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 Chronic anticoagulation   . Acute ischemic right PCA stroke ([facility]) 06/18/2018  . CVA  (cerebral vascular accident) ([facility]) 06/14/2018  . Urinary retention 06/05/2018  . Encounter for therapeutic drug monitoring 02/08/2018  . History of CVA (cerebrovascular accident) 12/20/2017  . Coronary artery disease 12/19/2017  . Mild renal insufficiency 12/19/2017  . TIA (transient ischemic attack) 12/19/2017  . Hip flexor tendinitis 05/22/2017  . Pain of both hip joints 04/25/2017  . Dyslipidemia  09/01/2015  . BPH without urinary obstruction 09/01/2015  . DM (diabetes mellitus), type 2, uncontrolled, periph vascular complic (H[facility]) 07/10/2014  . Essential hypertension 04/20/2009  . Cardiomyopathy, ischemic 04/20/2009  . LEFT BUNDLE BRANCH BLOCK 04/20/2009  . Chronic systolic heart failure ([facility]) 04/20/2009    RINE,KATHRYN 09/12/2018, 12:19 PM  [facility] [address] [place], [place], [address] Phone: [phone]   Fax:  [phone]  Name: Karl [name] MRN: [id number] Date of Birth: 12-May-1956

## 2018-09-12 NOTE — Therapy (Signed)
[facility] [address] [place], [place], [address] Phone: [phone]   Fax:  [phone]  Physical Therapy Treatment  Patient Details  Name: Karl [name] MRN: [id number] Date of Birth: Nov 09, 1956 Referring Provider (PT): Alger Simons, MD   Encounter Date: 09/12/2018  PT End of Session - 09/12/18 1343    Visit Number  9    Number of Visits  30    Date for PT Re-Evaluation  11/15/18    Authorization Type  [employer] $[id number] oop met, 100% coverage; VL PT & speech 30 visit limit hard max zero used before eval.    PT Start Time  1102    PT Stop Time  1148    PT Time Calculation (min)  46 min    Equipment Utilized During Treatment  Gait belt    Activity Tolerance  Patient tolerated treatment well    Behavior During Therapy  WFL for tasks assessed/performed       Past Medical History:  Diagnosis Date  . Acute CVA (cerebrovascular accident) ([place]) 06/14/2018  . Coronary artery disease    blockage   Stent Dr. Lovena Le 15 years 1998  . Dyspnea   . Hypertension   . Stroke [facility]) 2019   denies residual on 06/14/2018  . Type II diabetes mellitus ([place]) 07/2014 dx    Past Surgical History:  Procedure Laterality Date  . CORONARY ANGIOPLASTY WITH STENT PLACEMENT  1998  . FRACTURE SURGERY    . GREEN LIGHT LASER TURP (TRANSURETHRAL RESECTION OF PROSTATE N/A 06/05/2018   Procedure: GREEN LIGHT LASER TURP , TRANSURETHRAL RESECTION OF PROSTATE;  Surgeon: [name], MD;  Location: [facility];  Service: Urology;  Laterality: N/A;  . TIBIA FRACTURE SURGERY Left 1980s    There were no vitals filed for this visit.  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  No/denies                       [facility] Adult PT Treatment/Exercise - 09/12/18 1120      Transfers   Transfers  Sit to Stand;Stand to Sit    Sit to Stand  5: Supervision;With upper extremity assist;From bed;From chair/3-in-1    Stand to Sit  5: Supervision;With upper extremity assist;To bed;To chair/3-in-1    Number of Reps  10 reps    Comments  Rt foot on balance bubble for increased LLE weight bearing; initial 5 reps with bil. UE support; 2nd 5 reps with RUE only ; pt then performed 3 reps with both feet on floor - no UE support      Ambulation/Gait   Ambulation/Gait  Yes    Ambulation/Gait Assistance  4: Min guard    Ambulation/Gait Assistance Details  cues for Lt heel at initial contact of stance    Ambulation Distance (Feet)  350 Feet    Assistive device  Straight cane   with rubber quad tip   Gait Pattern  Step-through pattern;Ataxic    Ambulation Surface  Level;Indoor  Gait Comments  Pt gait trained initially inside // bars - forward and then backward with bil. UE support, then 2 reps with RUE support; 2 reps without any UE support with close CGA for safety      TherEx:  Pt performed standing Lt hip flexion and abduction with 3# weight on LLE; performed same exs on RLE without weight -  10 reps each ex. For improved LLE SLS Marching in place 10 reps each leg (no weight)  Heel raises 10 reps bil. LE's with UE support ; attempted LLE only but very difficult due to weak Lt plantarflexors (pt used bil. UE support on // bars)    Balance Exercises - 09/12/18 1340      Balance Exercises: Standing   Stepping Strategy  Anterior;Lateral;10 reps   each leg - with RW for support prn with CGA   Other Standing Exercises  Pt performed SLS/coordination exercise with LLE - touching balance bubble x 5 reps; touching blue square on tile floor, then brown square, then back to  carpet- 5 reps with each foot for improved SLS and coordination;  pt performed stepping over white pvc pipe on floor with 3# weight on LLE - 10 reps with CGA for balance            PT Short Term Goals - 08/29/18 1102      PT SHORT TERM GOAL #1   Title  Patient demonstrates understanding of initial HEP. (All STGs Target Date: 08/30/2018)    Time  1    Period  Months    Status  On-going      PT SHORT TERM GOAL #2   Title  Patient ambulates 300' with RW with supervision & scans environment with loss of balance.     Baseline  08/27/18: met today    Status  Achieved      PT SHORT TERM GOAL #3   Title  Patient negotiates ramps & curbs with RW and stairs with 1 rail & cane with min guard.     Baseline  08/27/18: met the ramp/curb with previous PT session per note, met stair portion today.     Status  Achieved      PT SHORT TERM GOAL #4   Title  Patient performs standing balance with RW support: reaching 10" anteriorly & to floor, looks over shoulders with supervision.     Baseline  08/27/18: met today    Status  Achieved        PT Long Term Goals - 07/29/18 2117      PT LONG TERM GOAL #1   Title  Patient demonstrates & verbalizes ongoing HEP / fitness plan with medical issues. (All LTGs Target Date: 11/15/2018)    Time  15    Period  Weeks    Status  New    Target Date  11/15/18      PT LONG TERM GOAL #2   Title  Berg Balance >45/56 to indicate lower fall risk.     Time  15    Period  Weeks    Status  New    Target Date  11/15/18      PT LONG TERM GOAL #3   Title  Timed Up & Go with cane or less <30 sec safely.    Time  15    Period  Weeks    Status  New    Target Date  11/15/18      PT LONG TERM GOAL #4  Title  Dynamic Gait Index with cane >12/24 to indicate lower fall risk.     Time  15    Period  Weeks    Status  New    Target Date  11/15/18      PT LONG TERM GOAL #5   Title  Patient ambulates 400' with cane modified independent for community mobility.      Time  15    Period  Weeks    Status  New    Target Date  11/15/18      PT LONG TERM GOAL #6   Title  Patient negotiates ramps, curbs & stairs single rail with cane modified independent for community access.    Time  15    Period  Weeks    Status  New    Target Date  11/15/18            Plan - 09/12/18 1344    Clinical Impression Statement  Pt did very well with gait training with use of SPC with rubber quad tip with cues to relax LUE and to place Lt heel first in stance phase of gait, rather than full foot flat at initial contact.  Pt fatigued easily and required frequent, short seated rest periods.  Balance improved with repetition of standing balance activities.      Rehab Potential  Good    Clinical Impairments Affecting Rehab Potential  Right CVA 06/14/2018, Left CVA Jan 2019, CAD, CABG, cardiomyopathy, HTN, tibia fx with surgery, DM2,     PT Frequency  2x / week    PT Duration  Other (comment)    PT Treatment/Interventions  ADLs/Self Care Home Management;Canalith Repostioning;DME Instruction;Gait training;Stair training;Functional mobility training;Therapeutic activities;Therapeutic exercise;Balance training;Neuromuscular re-education;Patient/family education;Orthotic Fit/Training;Manual techniques;Vestibular    PT Next Visit Plan   continue with balance training with emphasis on left LE weight bearing/strengthening, gait with cane with rubber quad tip    PT Home Exercise Plan  [id number]     Consulted and Agree with Plan of Care  Patient;Family member/caregiver    Family Member Consulted  dtr, [name]       Patient will benefit from skilled therapeutic intervention in order to improve the following deficits and impairments:  Abnormal gait, Decreased activity tolerance, Decreased balance, Decreased coordination, Decreased endurance, Decreased knowledge of use of DME, Decreased mobility, Decreased strength, Dizziness, Impaired flexibility, Impaired sensation, Postural  dysfunction  Visit Diagnosis: Other abnormalities of gait and mobility  Unsteadiness on feet  Other lack of coordination  Muscle weakness (generalized)     Problem List Patient Active Problem List   Diagnosis Date Noted  . 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 Chronic anticoagulation   . Acute ischemic right PCA stroke ([place]) 06/18/2018  . CVA (cerebral vascular accident) ([place]) 06/14/2018  . Urinary retention 06/05/2018  . Encounter for therapeutic drug monitoring 02/08/2018  . History of CVA (cerebrovascular accident) 12/20/2017  . Coronary artery disease 12/19/2017  . Mild renal insufficiency 12/19/2017  . TIA (transient ischemic attack) 12/19/2017  . Hip flexor tendinitis 05/22/2017  . Pain of both hip joints 04/25/2017  . Dyslipidemia 09/01/2015  . BPH without urinary obstruction 09/01/2015  . DM (diabetes mellitus), type 2, uncontrolled, periph vascular complic ([place]) 17/00/1749  . Essential hypertension 04/20/2009  . Cardiomyopathy, ischemic 04/20/2009  . LEFT BUNDLE BRANCH BLOCK 04/20/2009  . Chronic systolic heart failure ([place]) 04/20/2009    Lavren Lewan, [place], PT 09/12/2018, 1:50 PM  [facility] [address]  [place], [place], [address] Phone: [phone]   Fax:  [phone]  Name: Karl [name] MRN: [id number] Date of Birth: [birth year]

## 2018-09-17 ENCOUNTER — Telehealth: Payer: Self-pay | Admitting: Adult Health

## 2018-09-17 ENCOUNTER — Ambulatory Visit: Payer: Managed Care, Other (non HMO) | Admitting: Adult Health

## 2018-09-17 NOTE — Progress Notes (Deleted)
[facility] [address] [place]. [place] [address] [phone]       OFFICE FOLLOW UP NOTE  [name] Date of Birth:  1956-08-24 Medical Record Number:  [id number]   Reason for Referral:  hospital stroke follow up  CHIEF COMPLAINT:  No chief complaint on file.   HPI: Karl [name] is being seen today for initial visit in the office for embolic right PCA and right SCA territories on 07/15/2018. History obtained from *** and chart review. Reviewed all radiology images and labs personally.  Karl [name] is a 62 y.o. male with history of a previous CVA, coronary artery disease with stent, hypertension, chronic CHF with ejection fraction of 20 to 25% on warfarin therapy (but stopped due to recent TURP procedure on 06/05/2018, INR 1.16 on admission) and diabetes mellitus  who presented with dizziness, right lower extremity numbness, double vision and balance deficits. He did not receive IV t-PA due to unclear time of onset.  CT head reviewed and showed hypodensity right occipital lobe compatible with acute infarcts.  MRI brain reviewed and showed acute infarcts in both the right PCA and right SCA territories with superimposed punctate acute infarct also the left PCA territory white matter.  CTA head and neck showed right P1 occlusion with intermittent collateralization.  2D echo showed an EF of 20% with possible apical thrombus.  LDL 182 and recommended initiation of Crestor 40 mg daily.  HTN stable during admission and recommended long-term BP goal normotensive range.  A1c 6.8 and recommended continued follow-up with PCP for DM management.  Patient was on warfarin PTA but due to low INR level due to recent surgery, warfarin was continued along with IV heparin and this was stopped once INR between 2-3 with recommendation of continuation of due to cardiomyopathy.  Prior history of infarct on 12/2017 with right CR infarct and right M2 high-grade stenosis with cardiac event  monitoring negative for atrial fibrillation.  It was determined that these infarcts were consistent with embolic source likely due to low ejection fraction with subtherapeutic INR level.  Patient was discharged to CIR due to continued left hemianopia and left hemiparesis.  Patient was discharged home on 07/09/2018 with recommendations of outpatient therapies.    ROS:   14 system review of systems performed and negative with exception of ***  PMH:  Past Medical History:  Diagnosis Date  . Acute CVA (cerebrovascular accident) ([place]) 06/14/2018  . Coronary artery disease    blockage   Stent Dr. Lovena Le 15 years 1998  . Dyspnea   . Hypertension   . Stroke [facility]) 2019   denies residual on 06/14/2018  . Type II diabetes mellitus ([place]) 07/2014 dx    PSH:  Past Surgical History:  Procedure Laterality Date  . CORONARY ANGIOPLASTY WITH STENT PLACEMENT  1998  . FRACTURE SURGERY    . GREEN LIGHT LASER TURP (TRANSURETHRAL RESECTION OF PROSTATE N/A 06/05/2018   Procedure: GREEN LIGHT LASER TURP , TRANSURETHRAL RESECTION OF PROSTATE;  Surgeon: [name], MD;  Location: [facility];  Service: Urology;  Laterality: N/A;  . TIBIA FRACTURE SURGERY Left 1980s    Social History:  Social History   Socioeconomic History  . Marital status: Married    Spouse name: Not on file  . Number of children: Not on file  . Years of education: Not on file  . Highest education level: Not on file  Occupational History  . Not on file  Social Needs  .  Financial resource strain: Not on file  . Food insecurity:    Worry: Not on file    Inability: Not on file  . Transportation needs:    Medical: Not on file    Non-medical: Not on file  Tobacco Use  . Smoking status: Former Smoker    Packs/day: 2.00    Years: 5.00    Pack years: 10.00    Types: Cigarettes    Last attempt to quit: 12/04/2000    Years since quitting: 17.7  . Smokeless tobacco: Never Used  Substance and Sexual Activity  . Alcohol use: Yes     Comment: 06/14/2018 "nothing in 2019"  . Drug use: Not Currently  . Sexual activity: Not on file  Lifestyle  . Physical activity:    Days per week: Not on file    Minutes per session: Not on file  . Stress: Not on file  Relationships  . Social connections:    Talks on phone: Not on file    Gets together: Not on file    Attends religious service: Not on file    Active member of club or organization: Not on file    Attends meetings of clubs or organizations: Not on file    Relationship status: Not on file  . Intimate partner violence:    Fear of current or ex partner: Not on file    Emotionally abused: Not on file    Physically abused: Not on file    Forced sexual activity: Not on file  Other Topics Concern  . Not on file  Social History Narrative   Former smoker - quit 1998 after stent -    Lives with dtr and 2 g-sons   Separated from wife in 2005, good terms   Employed with [employer] - walks at lot at work   No regular exercise    Family History:  Family History  Problem Relation Age of Onset  . Diabetes Mother   . Diabetes Father   . Stroke Father 46  . Diabetes Sister   . Clotting disorder Sister 45  . Diabetes Other        nephew  . Diabetes Brother   . Diabetes Maternal Grandmother     Medications:   Current Outpatient Medications on File Prior to Visit  Medication Sig Dispense Refill  . acetaminophen (TYLENOL) 325 MG tablet Take 1-2 tablets (325-650 mg total) by mouth every 4 (four) hours as needed for mild pain.    . blood glucose meter kit and supplies KIT Dispense based on patient and insurance preference. Use up to four times daily as directed. (FOR E11.9). 1 each 0  . carvedilol (COREG) 12.5 MG tablet Take 1 tablet (12.5 mg total) by mouth 2 (two) times daily with a meal. 60 tablet 0  . Lancets 30G MISC Use to check blood sugars daily 100 each 3  . metFORMIN (GLUCOPHAGE) 1000 MG tablet TAKE 1 TABLET BY MOUTH TWICE DAILY WITH A MEAL  (Patient taking differently: TAKE 1 TABLET (1063m) BY MOUTH TWICE DAILY WITH A MEAL) 90 tablet 1  . polyethylene glycol (MIRALAX / GLYCOLAX) packet Take 17 g by mouth 2 (two) times daily. 60 each 0  . potassium chloride (K-DUR) 10 MEQ tablet Take 1 tablet (10 mEq total) by mouth 2 (two) times daily. 60 tablet 0  . potassium chloride (K-DUR,KLOR-CON) 10 MEQ tablet TAKE 1 TABLET BY MOUTH TWICE DAILY 60 tablet 0  . rosuvastatin (CRESTOR) 40 MG tablet  TAKE 1 TABLET BY MOUTH ONCE DAILY AT  6  PM 90 tablet 0  . warfarin (COUMADIN) 5 MG tablet Take as directed by Coumadin Clinic 45 tablet 1   No current facility-administered medications on file prior to visit.     Allergies:   Allergies  Allergen Reactions  . Lipitor [Atorvastatin] Nausea And Vomiting     Physical Exam  There were no vitals filed for this visit. There is no height or weight on file to calculate BMI. No exam data present  General: well developed, well nourished, seated, in no evident distress Head: head normocephalic and atraumatic.   Neck: supple with no carotid or supraclavicular bruits Cardiovascular: regular rate and rhythm, no murmurs Musculoskeletal: no deformity Skin:  no rash/petichiae Vascular:  Normal pulses all extremities  Neurologic Exam Mental Status: Awake and fully alert. Oriented to place and time. Recent and remote memory intact. Attention span, concentration and fund of knowledge appropriate. Mood and affect appropriate.  Cranial Nerves: Fundoscopic exam reveals sharp disc margins. Pupils equal, briskly reactive to light. Extraocular movements full without nystagmus. Visual fields full to confrontation. Hearing intact. Facial sensation intact. Face, tongue, palate moves normally and symmetrically.  Motor: Normal bulk and tone. Normal strength in all tested extremity muscles. Sensory.: intact to touch , pinprick , position and vibratory sensation.  Coordination: Rapid alternating movements normal in all  extremities. Finger-to-nose and heel-to-shin performed accurately bilaterally. Gait and Station: Arises from chair without difficulty. Stance is normal. Gait demonstrates normal stride length and balance . Able to heel, toe and tandem walk without difficulty.  Reflexes: 1+ and symmetric. Toes downgoing.    NIHSS  *** Modified Rankin  ***    Diagnostic Data (Labs, Imaging, Testing)  Ct Angio Head W Or Wo Contrast Ct Angio Neck W Or Wo Contrast 06/14/2018 CTA head:  1. Right proximal PCA occlusion at distal P1 segment. Intermediate collateralization of right PCA distribution.  2. No additional intracranial large vessel occlusion. No aneurysm, significant stenosis, or vascular malformation.   CTA neck:  Patent carotid and vertebral arteries. No high-grade stenosis by NASCET criteria, dissection, or aneurysm.   CT head:  1. Areas of hypoattenuation in right thalamus, right medial occipital lobe, and right superior cerebellum corresponding to acute infarcts on MRI of the brain.  2. No new intracranial hemorrhage, stroke, or focal mass effect.   Ct Head Wo Contrast 06/14/2018 IMPRESSION:  Hypodensity right occipital lobe compatible with acute infarct. Negative for hemorrhage.   Mr Brain Wo Contrast 06/14/2018 IMPRESSION:  1. Acute infarcts in both the Right PCA and Right SCA territories with no associated hemorrhage or mass effect.  2. Superimposed punctate acute infarct also in Left PCA territory white matter.  3. This pattern might indicate a recent embolic shower to the posterior circulation, however, there is an underlying chronic Left PICA territory infarct.   Transthoracic Echocardiogram - Left ventricle: The cavity size was moderately dilated. Wall thickness was normal. The estimated ejection fraction was 20%. Diffuse hypokinesis. There is akinesis of the anteroseptal and anterior myocardium. Indeterminate diastolic function. Definity contrast shows swirling  contrast and loosely organized thrombotic material at the LV apex. - Aortic valve: Mildly calcified annulus. Trileaflet. - Mitral valve: There was mild regurgitation. - Right atrium: Central venous pressure (est): 3 mm Hg. - Atrial septum: No defect or patent foramen ovale was identified. - Tricuspid valve: There was trivial regurgitation. - Pulmonary arteries: Systolic pressure could not be accurately estimated. - Pericardium, extracardiac: There was no  pericardial effusion.   ASSESSMENT: Karl [name] is a 62 y.o. year old male here with right PCA and right PCA embolic infarcts on 6/43/3295 secondary to low ejection fraction with subtherapeutic INR due to holding warfarin from recent TURP procedure on 06/05/2018. Vascular risk factors include prior infarcts, CAD with stent, HTN, HLD, DM and chronic CHF with low ejection fraction.     PLAN: -Continue {anticoagulants:31417}  and ***  for secondary stroke prevention -F/u with PCP regarding your *** management -continue to monitor BP at home -advised to continue to stay active and maintain a healthy diet -Maintain strict control of hypertension with blood pressure goal below 130/90, diabetes with hemoglobin A1c goal below 6.5% and cholesterol with LDL cholesterol (bad cholesterol) goal below 70 mg/dL. I also advised the patient to eat a healthy diet with plenty of whole grains, cereals, fruits and vegetables, exercise regularly and maintain ideal body weight.  Follow up in *** or call earlier if needed   Greater than 50% of time during this 25 minute visit was spent on counseling,explanation of diagnosis of ***, reviewing risk factor management of ***, planning of further management, discussion with patient and family and coordination of care    Venancio Poisson, [facility]  [facility] [address] [place], [place] [address]  Phone [phone] Fax [phone] Note: This document was prepared  with digital dictation and possible smart phrase technology. Any transcriptional errors that result from this process are unintentional.

## 2018-09-17 NOTE — Telephone Encounter (Signed)
FYI- patient is a new patient and has had 2 no-shows in 2019. °

## 2018-09-17 NOTE — Telephone Encounter (Signed)
Pt no show for appt for Karl Bumps NP today.

## 2018-09-18 ENCOUNTER — Encounter
Payer: Managed Care, Other (non HMO) | Attending: Physical Medicine & Rehabilitation | Admitting: Physical Medicine & Rehabilitation

## 2018-09-18 ENCOUNTER — Encounter: Payer: Self-pay | Admitting: Adult Health

## 2018-09-18 DIAGNOSIS — I2510 Atherosclerotic heart disease of native coronary artery without angina pectoris: Secondary | ICD-10-CM | POA: Insufficient documentation

## 2018-09-18 DIAGNOSIS — R0600 Dyspnea, unspecified: Secondary | ICD-10-CM | POA: Insufficient documentation

## 2018-09-18 DIAGNOSIS — Z87891 Personal history of nicotine dependence: Secondary | ICD-10-CM | POA: Insufficient documentation

## 2018-09-18 DIAGNOSIS — E119 Type 2 diabetes mellitus without complications: Secondary | ICD-10-CM | POA: Insufficient documentation

## 2018-09-18 DIAGNOSIS — Z7901 Long term (current) use of anticoagulants: Secondary | ICD-10-CM | POA: Insufficient documentation

## 2018-09-18 DIAGNOSIS — R531 Weakness: Secondary | ICD-10-CM | POA: Insufficient documentation

## 2018-09-18 DIAGNOSIS — I10 Essential (primary) hypertension: Secondary | ICD-10-CM | POA: Insufficient documentation

## 2018-09-18 DIAGNOSIS — I69398 Other sequelae of cerebral infarction: Secondary | ICD-10-CM | POA: Insufficient documentation

## 2018-09-23 ENCOUNTER — Ambulatory Visit (INDEPENDENT_AMBULATORY_CARE_PROVIDER_SITE_OTHER): Payer: Managed Care, Other (non HMO) | Admitting: *Deleted

## 2018-09-23 DIAGNOSIS — Z8673 Personal history of transient ischemic attack (TIA), and cerebral infarction without residual deficits: Secondary | ICD-10-CM

## 2018-09-23 DIAGNOSIS — Z5181 Encounter for therapeutic drug level monitoring: Secondary | ICD-10-CM | POA: Diagnosis not present

## 2018-09-23 DIAGNOSIS — G459 Transient cerebral ischemic attack, unspecified: Secondary | ICD-10-CM

## 2018-09-23 LAB — POCT INR: INR: 3.2 — AB (ref 2.0–3.0)

## 2018-09-23 NOTE — Patient Instructions (Addendum)
Description   Today take 1 tablet then start taking 1.5 tablets daily except 1 tablet on Mondays and Fridays.  Recheck INR in 2 weeks.  Call the Coumadin clinic with any questions #[phone].

## 2018-09-24 ENCOUNTER — Encounter: Payer: Self-pay | Admitting: Occupational Therapy

## 2018-09-24 ENCOUNTER — Ambulatory Visit: Payer: Managed Care, Other (non HMO) | Admitting: Occupational Therapy

## 2018-09-24 DIAGNOSIS — M6281 Muscle weakness (generalized): Secondary | ICD-10-CM

## 2018-09-24 DIAGNOSIS — R293 Abnormal posture: Secondary | ICD-10-CM

## 2018-09-24 DIAGNOSIS — I69354 Hemiplegia and hemiparesis following cerebral infarction affecting left non-dominant side: Secondary | ICD-10-CM

## 2018-09-24 DIAGNOSIS — R278 Other lack of coordination: Secondary | ICD-10-CM

## 2018-09-24 DIAGNOSIS — R41842 Visuospatial deficit: Secondary | ICD-10-CM

## 2018-09-24 DIAGNOSIS — R2681 Unsteadiness on feet: Secondary | ICD-10-CM

## 2018-09-24 DIAGNOSIS — I69315 Cognitive social or emotional deficit following cerebral infarction: Secondary | ICD-10-CM

## 2018-09-24 NOTE — Therapy (Signed)
[facility] [address] [place], [place], [address] Phone: [phone]   Fax:  [phone]  Occupational Therapy Treatment  Patient Details  Name: Karl [name] MRN: [id number] Date of Birth: [birth year] No data recorded  Encounter Date: 09/24/2018  OT End of Session - 09/24/18 1247    Visit Number  13    Number of Visits  29    Date for OT Re-Evaluation  11/19/18    Authorization Type  [employer] - pt has 30 visit limit for OT    Authorization - Visit Number  13    Authorization - Number of Visits  30    OT Start Time  1103    OT Stop Time  1148    OT Time Calculation (min)  45 min    Activity Tolerance  Patient tolerated treatment well       Past Medical History:  Diagnosis Date  . Acute CVA (cerebrovascular accident) ([place]) 06/14/2018  . Coronary artery disease    blockage   Stent Dr. Lovena Le 15 years 1998  . Dyspnea   . Hypertension   . Stroke [facility]) 2019   denies residual on 06/14/2018  . Type II diabetes mellitus ([place]) 07/2014 dx    Past Surgical History:  Procedure Laterality Date  . CORONARY ANGIOPLASTY WITH STENT PLACEMENT  1998  . FRACTURE SURGERY    . GREEN LIGHT LASER TURP (TRANSURETHRAL RESECTION OF PROSTATE N/A 06/05/2018   Procedure: GREEN LIGHT LASER TURP , TRANSURETHRAL RESECTION OF PROSTATE;  Surgeon: [name], MD;  Location: [facility];  Service: Urology;  Laterality: N/A;  . TIBIA FRACTURE SURGERY Left 1980s    There were no vitals filed for this visit.  Subjective Assessment - 09/24/18 1111    Subjective   I don't have pain my leg and my arm are just tight - I think its the weather.     Pertinent History  CVA 06/14/18, T2DM, CAD with ICM on chronic coumadin, prior CVA 12/2017, TURP 06/05/18 for BPH    Patient Stated Goals  regain independence    Currently in Pain?  No/denies                   OT Treatments/Exercises (OP) - 09/24/18 0001      ADLs   ADL Comments  Assessed  STG and LTG's - pt has met all STG's however has not met LTG's. Pt has missed several appts due to transportation issues.  Primary OT recommends renewing pt to continue to work on goals.  Pt in agreement. Goal assessment addressed LUE functional reach, LUE strength, coordination, balance and functional mobility. See goal section for details.                OT Short Term Goals - 09/24/18 1238      OT SHORT TERM GOAL #1   Title  I with inital HEP    Status  Achieved      OT SHORT TERM GOAL #2   Title  Pt will perform all basic ADLs modified independently.    Status  Achieved      OT SHORT TERM GOAL #3   Title  Pt will demonstrate improved fine motor coordination for ADLS as evidenced by decreasing 9 hole peg test score to 75 secs or less.    Status  On-going   79.29 secs     OT SHORT TERM GOAL #4   Title  Pt will perfrom snack prep/  basic home management with supervision.    Status  Achieved   using walker tray, pt was provided info for purchase.     OT SHORT TERM GOAL #5   Title  Pt will verbalize understanding of compensatory strategiues for short term memory deficits.    Status  On-going   using calendar and pill box, therapist recommends use of alarms of timers     OT SHORT TERM GOAL #6   Title  PT will demonstrate improved fine motor coordination for ADL's as evidenced by decreasing 9 hole peg to 60 seconds or less. 11/19/2019Pt wil    Status  New      OT SHORT TERM GOAL #7   Title  Pt will demonstrate adequate postural alignment and control to as well as LUE strength retrieve 2 pound object from overhead shelf at 130* without drops or pain.    Status  New        OT Long Term Goals - 09/24/18 1244      OT LONG TERM GOAL #1   Title  I with updated HEP.- 11/19/2018    Time  8    Period  Weeks    Status  On-going      OT LONG TERM GOAL #2   Title  Pt will perform basic home managmeent and cooking at a modified independent level demonstrating good safety  awareness.    Time  8    Period  Weeks    Status  On-going   09/24/2018  able to do drink/simple snack only     OT LONG TERM GOAL #3   Title  Pt will navigate a busy environment and locate items with 90% or better accuracy.    Time  8    Period  Weeks    Status  On-going      OT LONG TERM GOAL #4   Title  Pt will demonstrate improved fine motor coordination for ADLs as evidenced by decreasing 9 hole peg test score to 60 secs or less. - Goal moved to STG    Time  8    Period  Weeks    Status  On-going   09/24/2018  74.43     OT LONG TERM GOAL #5   Title  Pt will demonstrate improved LUE strength as evidenced by retrieving a 3 lbs item from overhead shelf at 130 without drops.    Time  8    Period  Weeks    Status  On-going      OT LONG TERM GOAL #6   Title  Pt will demonstrate sufficient dynamic standing balance to complete bilateral activity in standing (no UE support) for at least 5 minutes.    Status  New            Plan - 09/24/18 1246    Clinical Impression Statement  Pt has met all STG's however has not met LTG's. Pt agreeable to renewing POC to continuing addressing goals.     Occupational Profile and client history currently impacting functional performance   Pt was completely I and working full time prior to CVA, currently he is unable to work. CVA 06/14/18,Type II DM, CAD with ICM on chronic coumadin, prior CVA 12/2017, TURP 06/05/18 for BPH     Occupational performance deficits (Please refer to evaluation for details):  ADL's;IADL's;Work;Leisure;Social Participation    Rehab Potential  Good    Current Impairments/barriers affecting progress:  cognitive and visual perceptual deficits, decreased balance  OT Frequency  2x / week    OT Duration  8 weeks    OT Treatment/Interventions  Self-care/ADL training;Paraffin;Therapeutic exercise;DME and/or AE instruction;Functional Mobility Training;Cognitive remediation/compensation;Balance training;Manual Therapy;Neuromuscular  education;Gait Training;Fluidtherapy;Ultrasound;Aquatic Therapy;Moist Heat;Contrast Bath;Energy conservation;Passive range of motion;Therapeutic activities;Patient/family education    Plan   LUE neuro re-ed, visual perceptual skills, attention to Lt hand/side, NMR for balance, trunk control    Consulted and Agree with Plan of Care  Patient       Patient will benefit from skilled therapeutic intervention in order to improve the following deficits and impairments:  Abnormal gait, Decreased balance, Decreased endurance, Decreased mobility, Impaired vision/preception, Impaired perceived functional ability, Decreased range of motion, Decreased knowledge of precautions, Decreased cognition, Decreased activity tolerance, Decreased coordination, Decreased knowledge of use of DME, Decreased safety awareness, Decreased strength, Impaired UE functional use  Visit Diagnosis: Unsteadiness on feet  Other lack of coordination  Muscle weakness (generalized)  Hemiplegia and hemiparesis following cerebral infarction affecting left non-dominant side (HCC)  Visuospatial deficit  Cognitive social or emotional deficit following cerebral infarction  Abnormal posture    Problem List Patient Active Problem List   Diagnosis Date Noted  . 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 Chronic anticoagulation   . Acute ischemic right PCA stroke ([place]) 06/18/2018  . CVA (cerebral vascular accident) ([place]) 06/14/2018  . Urinary retention 06/05/2018  . Encounter for therapeutic drug monitoring 02/08/2018  . History of CVA (cerebrovascular accident) 12/20/2017  . Coronary artery disease 12/19/2017  . Mild renal insufficiency 12/19/2017  . TIA (transient ischemic attack) 12/19/2017  . Hip flexor tendinitis 05/22/2017  . Pain of both hip joints 04/25/2017  . Dyslipidemia 09/01/2015  . BPH without urinary obstruction 09/01/2015  . DM (diabetes mellitus), type 2, uncontrolled, periph vascular complic ([place]) 34/19/6222   . Essential hypertension 04/20/2009  . Cardiomyopathy, ischemic 04/20/2009  . LEFT BUNDLE BRANCH BLOCK 04/20/2009  . Chronic systolic heart failure ([place]) 04/20/2009    Quay Burow, OTR/L 09/24/2018, 12:49 PM  [facility] [address] [place], [place], [address] Phone: [phone]   Fax:  [phone]  Name: Karl [name] MRN: [id number] Date of Birth: 10/03/1956

## 2018-09-27 ENCOUNTER — Encounter: Payer: Self-pay | Admitting: Physical Therapy

## 2018-09-27 ENCOUNTER — Ambulatory Visit: Payer: Managed Care, Other (non HMO) | Admitting: Physical Therapy

## 2018-09-27 DIAGNOSIS — M6281 Muscle weakness (generalized): Secondary | ICD-10-CM

## 2018-09-27 DIAGNOSIS — R278 Other lack of coordination: Secondary | ICD-10-CM

## 2018-09-27 DIAGNOSIS — R2681 Unsteadiness on feet: Secondary | ICD-10-CM

## 2018-09-27 DIAGNOSIS — I69354 Hemiplegia and hemiparesis following cerebral infarction affecting left non-dominant side: Secondary | ICD-10-CM | POA: Diagnosis not present

## 2018-09-28 NOTE — Therapy (Signed)
[facility] [address] [place], [place], [address] Phone: [phone]   Fax:  [phone]  Physical Therapy Treatment  Patient Details  Name: Karl [name] MRN: [id number] Date of Birth: Sep 08, 1956 Referring Provider (PT): Alger Simons, MD   Encounter Date: 09/27/2018  PT End of Session - 09/27/18 1110    Visit Number  10    Number of Visits  30    Date for PT Re-Evaluation  11/15/18    Authorization Type  [employer] [id number] oop met, 100% coverage; VL PT & speech 30 visit limit hard max zero used before eval.    PT Start Time  1104    PT Stop Time  1145    PT Time Calculation (min)  41 min    Equipment Utilized During Treatment  Gait belt    Activity Tolerance  Patient tolerated treatment well    Behavior During Therapy  WFL for tasks assessed/performed       Past Medical History:  Diagnosis Date  . Acute CVA (cerebrovascular accident) ([place]) 06/14/2018  . Coronary artery disease    blockage   Stent Dr. Lovena Le 15 years 1998  . Dyspnea   . Hypertension   . Stroke [facility]) 2019   denies residual on 06/14/2018  . Type II diabetes mellitus ([place]) 07/2014 dx    Past Surgical History:  Procedure Laterality Date  . CORONARY ANGIOPLASTY WITH STENT PLACEMENT  1998  . FRACTURE SURGERY    . GREEN LIGHT LASER TURP (TRANSURETHRAL RESECTION OF PROSTATE N/A 06/05/2018   Procedure: GREEN LIGHT LASER TURP , TRANSURETHRAL RESECTION OF PROSTATE;  Surgeon: [name], MD;  Location: [facility];  Service: Urology;  Laterality: N/A;  . TIBIA FRACTURE SURGERY Left 1980s    There were no vitals filed for this visit.  Subjective Assessment - 09/27/18 1110    Subjective  No new complaints. No falls. Still with left shoulder stiffness.     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  No/denies    Pain Score  0-No pain         [facility] PT Assessment - 09/27/18 1112      Berg Balance Test   Sit to Stand  Able to stand without using hands and stabilize independently    Standing Unsupported  Able to stand safely 2 minutes    Sitting with Back Unsupported but Feet Supported on Floor or Stool  Able to sit safely and securely 2 minutes    Stand to Sit  Controls descent by using hands    Transfers  Able to transfer safely, minor use of hands    Standing Unsupported with Eyes Closed  Able to stand 10 seconds with supervision    Standing Ubsupported with Feet Together  Able to place feet together independently and stand for 1 minute with supervision    From Standing, Reach Forward with Outstretched Arm  Can reach forward >12 cm safely (5")    From Standing Position, Pick up Object from Floor  Able to pick up shoe safely and easily    From Standing Position, Turn to Look Behind Over each  Shoulder  Turn sideways only but maintains balance    Turn 360 Degrees  Needs close supervision or verbal cueing    Standing Unsupported, Alternately Place Feet on Step/Stool  Able to complete >2 steps/needs minimal assist   22.19 sec's   Standing Unsupported, One Foot in Front  Able to take small step independently and hold 30 seconds    Standing on One Leg  Tries to lift leg/unable to hold 3 seconds but remains standing independently    Total Score  39      Timed Up and Go Test   Normal TUG (seconds)  33.06   with cane assistance/min guard to min assist for balance       [facility] Adult PT Treatment/Exercise - 09/27/18 1140      Transfers   Transfers  Sit to Stand;Stand to Sit    Sit to Stand  5: Supervision;With upper extremity assist;From bed;From chair/3-in-1    Stand to Sit  5: Supervision;With upper extremity assist;To bed;To  chair/3-in-1      Ambulation/Gait   Ambulation/Gait  Yes    Ambulation/Gait Assistance  4: Min guard;4: Min assist    Ambulation/Gait Assistance Details  cues to narrow base of support, slow down for safety as pt gets more off balance when gait speed increases.     Ambulation Distance (Feet)  120 Feet   x1   Assistive device  Straight cane    Gait Pattern  Step-through pattern;Ataxic    Ambulation Surface  Level;Indoor          PT Short Term Goals - 08/29/18 1102      PT SHORT TERM GOAL #1   Title  Patient demonstrates understanding of initial HEP. (All STGs Target Date: 08/30/2018)    Time  1    Period  Months    Status  On-going      PT SHORT TERM GOAL #2   Title  Patient ambulates 300' with RW with supervision & scans environment with loss of balance.     Baseline  08/27/18: met today    Status  Achieved      PT SHORT TERM GOAL #3   Title  Patient negotiates ramps & curbs with RW and stairs with 1 rail & cane with min guard.     Baseline  08/27/18: met the ramp/curb with previous PT session per note, met stair portion today.     Status  Achieved      PT SHORT TERM GOAL #4   Title  Patient performs standing balance with RW support: reaching 10" anteriorly & to floor, looks over shoulders with supervision.     Baseline  08/27/18: met today    Status  Achieved        PT Long Term Goals - 07/29/18 2117      PT LONG TERM GOAL #1   Title  Patient demonstrates & verbalizes ongoing HEP / fitness plan with medical issues. (All LTGs Target Date: 11/15/2018)    Time  15    Period  Weeks    Status  New    Target Date  11/15/18      PT LONG TERM GOAL #2   Title  Berg Balance >45/56 to indicate lower fall risk.     Time  15    Period  Weeks    Status  New    Target Date  11/15/18      PT LONG TERM GOAL #3   Title  Timed Up &  Go with cane or less <30 sec safely.    Time  15    Period  Weeks    Status  New    Target Date  11/15/18      PT LONG TERM GOAL #4   Title   Dynamic Gait Index with cane >12/24 to indicate lower fall risk.     Time  15    Period  Weeks    Status  New    Target Date  11/15/18      PT LONG TERM GOAL #5   Title  Patient ambulates 400' with cane modified independent for community mobility.     Time  15    Period  Weeks    Status  New    Target Date  11/15/18      PT LONG TERM GOAL #6   Title  Patient negotiates ramps, curbs & stairs single rail with cane modified independent for community access.    Time  15    Period  Weeks    Status  New    Target Date  11/15/18            Plan - 09/27/18 1111    Clinical Impression Statement  Today's skilled session focused on progress toward LTGs as STGs had not been updated to reflect 30 day goals. Pt scored 39/56 today on Berg Balance Test adn 33.06 sec's wth Timed up and Go with cane. Did not test Dynamic Gait Index as pt needed min guard to min assit for fwd gait on level surfaces with cane. The pt is progressing toward goals and should benefit from continued PT to progress toward unmet goals.     Rehab Potential  Good    Clinical Impairments Affecting Rehab Potential  Right CVA 06/14/2018, Left CVA Jan 2019, CAD, CABG, cardiomyopathy, HTN, tibia fx with surgery, DM2,     PT Frequency  2x / week    PT Duration  Other (comment)    PT Treatment/Interventions  ADLs/Self Care Home Management;Canalith Repostioning;DME Instruction;Gait training;Stair training;Functional mobility training;Therapeutic activities;Therapeutic exercise;Balance training;Neuromuscular re-education;Patient/family education;Orthotic Fit/Training;Manual techniques;Vestibular    PT Next Visit Plan   continue with balance training with emphasis on left LE weight bearing/strengthening, gait with cane with rubber quad tip    PT Home Exercise Plan  [id number]     Consulted and Agree with Plan of Care  Patient;Family member/caregiver    Family Member Consulted  dtr, [name]       Patient will benefit from skilled  therapeutic intervention in order to improve the following deficits and impairments:  Abnormal gait, Decreased activity tolerance, Decreased balance, Decreased coordination, Decreased endurance, Decreased knowledge of use of DME, Decreased mobility, Decreased strength, Dizziness, Impaired flexibility, Impaired sensation, Postural dysfunction  Visit Diagnosis: Unsteadiness on feet  Muscle weakness (generalized)  Other lack of coordination     Problem List Patient Active Problem List   Diagnosis Date Noted  . 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 Chronic anticoagulation   . Acute ischemic right PCA stroke ([place]) 06/18/2018  . CVA (cerebral vascular accident) ([place]) 06/14/2018  . Urinary retention 06/05/2018  . Encounter for therapeutic drug monitoring 02/08/2018  . History of CVA (cerebrovascular accident) 12/20/2017  . Coronary artery disease 12/19/2017  . Mild renal insufficiency 12/19/2017  . TIA (transient ischemic attack) 12/19/2017  . Hip flexor tendinitis 05/22/2017  . Pain of both hip joints 04/25/2017  . Dyslipidemia 09/01/2015  . BPH without urinary obstruction 09/01/2015  . DM (  diabetes mellitus), type 2, uncontrolled, periph vascular complic ([place]) 23/36/1224  . Essential hypertension 04/20/2009  . Cardiomyopathy, ischemic 04/20/2009  . LEFT BUNDLE BRANCH BLOCK 04/20/2009  . Chronic systolic heart failure ([place]) 04/20/2009   Willow Ora, PTA, [facility] [address] [place], [place] [address] [phone] 09/28/18, 1:00 PM   Name: Karl [name] MRN: [id number] Date of Birth: Feb 05, 1956

## 2018-10-01 ENCOUNTER — Encounter: Payer: Self-pay | Admitting: Occupational Therapy

## 2018-10-01 ENCOUNTER — Ambulatory Visit: Payer: Managed Care, Other (non HMO) | Admitting: Occupational Therapy

## 2018-10-01 ENCOUNTER — Encounter: Payer: Self-pay | Admitting: Physical Therapy

## 2018-10-01 ENCOUNTER — Ambulatory Visit: Payer: Managed Care, Other (non HMO) | Admitting: Physical Therapy

## 2018-10-01 DIAGNOSIS — R2681 Unsteadiness on feet: Secondary | ICD-10-CM

## 2018-10-01 DIAGNOSIS — I69315 Cognitive social or emotional deficit following cerebral infarction: Secondary | ICD-10-CM

## 2018-10-01 DIAGNOSIS — R2689 Other abnormalities of gait and mobility: Secondary | ICD-10-CM

## 2018-10-01 DIAGNOSIS — I69354 Hemiplegia and hemiparesis following cerebral infarction affecting left non-dominant side: Secondary | ICD-10-CM

## 2018-10-01 DIAGNOSIS — R278 Other lack of coordination: Secondary | ICD-10-CM

## 2018-10-01 DIAGNOSIS — R41842 Visuospatial deficit: Secondary | ICD-10-CM

## 2018-10-01 DIAGNOSIS — M6281 Muscle weakness (generalized): Secondary | ICD-10-CM

## 2018-10-01 DIAGNOSIS — R293 Abnormal posture: Secondary | ICD-10-CM

## 2018-10-01 NOTE — Therapy (Signed)
[facility] [address] [place], [place], [address] Phone: [phone]   Fax:  [phone]  Physical Therapy Treatment  Patient Details  Name: Karl [name] MRN: [id number] Date of Birth: February 23, 1956 Referring Provider (PT): Alger Simons, MD   Encounter Date: 10/01/2018  PT End of Session - 10/01/18 1120    Visit Number  11    Number of Visits  30    Date for PT Re-Evaluation  11/15/18    Authorization Type  [employer] [id number] oop met, 100% coverage; VL PT & speech 30 visit limit hard max zero used before eval.    PT Start Time  1015    PT Stop Time  1055    PT Time Calculation (min)  40 min    Equipment Utilized During Treatment  Gait belt    Activity Tolerance  Patient tolerated treatment well    Behavior During Therapy  WFL for tasks assessed/performed       Past Medical History:  Diagnosis Date  . Acute CVA (cerebrovascular accident) ([place]) 06/14/2018  . Coronary artery disease    blockage   Stent Dr. Lovena Le 15 years 1998  . Dyspnea   . Hypertension   . Stroke [facility]) 2019   denies residual on 06/14/2018  . Type II diabetes mellitus ([place]) 07/2014 dx    Past Surgical History:  Procedure Laterality Date  . CORONARY ANGIOPLASTY WITH STENT PLACEMENT  1998  . FRACTURE SURGERY    . GREEN LIGHT LASER TURP (TRANSURETHRAL RESECTION OF PROSTATE N/A 06/05/2018   Procedure: GREEN LIGHT LASER TURP , TRANSURETHRAL RESECTION OF PROSTATE;  Surgeon: [name], MD;  Location: [facility];  Service: Urology;  Laterality: N/A;  . TIBIA FRACTURE SURGERY Left 1980s    There were no vitals filed for this visit.  Subjective Assessment - 10/01/18 1019    Subjective  No falls. Still with left shoulder stiffness. Pt complaines of tightness in right LE. Pt states he has been walking with cane inside his home.     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  No/denies                       [facility] Adult PT Treatment/Exercise - 10/01/18 1030      Transfers   Transfers  Sit to Stand    Sit to Stand  5: Supervision;Without upper extremity assist;From bed;From chair/3-in-1    Stand to Sit  5: Supervision;With upper extremity assist;To bed;To chair/3-in-1    Comments  6 reps; x3 staggered stance with right in front of left.       Ambulation/Gait   Ambulation/Gait  Yes    Ambulation/Gait Assistance  4: Min guard;4: Min assist    Ambulation/Gait Assistance Details  Verbal and tactile cue for correct posture and foward head gaze. Negotiated around cones x3 laps with min a and cues to safety.   Ambulation Distance (Feet)  230 Feet   156fx2   Assistive device  Straight cane    Gait Pattern  Step-through pattern;Ataxic    Ambulation Surface  Level;Indoor           --      --      --  High Level Balance   High Level Balance Activities  --    High Level Balance Comments  In parallel bars; alternating toe taps to raised platform; Bil toe reaches to colored dots foward and lateral; static standing on airex pad 20sec x1 feet apart, x3 feet together. Min A with cues for correct posture.                PT Short Term Goals - 09/30/18 0746      PT SHORT TERM GOAL #1   Title  Patient demonstrates understanding of updated HEP. (All updated STGs Target Date: 10/18/2018)    Status  Revised    Target Date  10/18/18      PT SHORT TERM GOAL #2   Title  Patient ambulates 300' with cane with supervision & scans environment without loss of balance.     Status  Revised    Target Date  10/18/18      PT SHORT TERM GOAL #3   Title  Patient negotiates ramps & curbs with cane and stairs with 1 rail &  cane with min guard.     Status  Revised    Target Date  10/18/18      PT SHORT TERM GOAL #4   Title  TUG <30 sec with cane with supervision    Status  New    Target Date  10/18/18        PT Long Term Goals - 07/29/18 2117      PT LONG TERM GOAL #1   Title  Patient demonstrates & verbalizes ongoing HEP / fitness plan with medical issues. (All LTGs Target Date: 11/15/2018)    Time  15    Period  Weeks    Status  New    Target Date  11/15/18      PT LONG TERM GOAL #2   Title  Berg Balance >45/56 to indicate lower fall risk.     Time  15    Period  Weeks    Status  New    Target Date  11/15/18      PT LONG TERM GOAL #3   Title  Timed Up & Go with cane or less <30 sec safely.    Time  15    Period  Weeks    Status  New    Target Date  11/15/18      PT LONG TERM GOAL #4   Title  Dynamic Gait Index with cane >12/24 to indicate lower fall risk.     Time  15    Period  Weeks    Status  New    Target Date  11/15/18      PT LONG TERM GOAL #5   Title  Patient ambulates 400' with cane modified independent for community mobility.     Time  15    Period  Weeks    Status  New    Target Date  11/15/18      PT LONG TERM GOAL #6   Title  Patient negotiates ramps, curbs & stairs single rail with cane modified independent for community access.    Time  15    Period  Weeks    Status  New    Target Date  11/15/18            Plan - 10/01/18 1122    Clinical Impression Statement  Session today focused on gait training with cane with rubber quad tip, balance, and LLE weight  bearing. Patient continues to present with an ataxic gait requiring min A for balance. Pt is able to tolerate advanced balance activities with min A.     Rehab Potential  Good    Clinical Impairments Affecting Rehab Potential  Right CVA 06/14/2018, Left CVA Jan 2019, CAD, CABG, cardiomyopathy, HTN, tibia fx with surgery, DM2,     PT Frequency  2x / week    PT Duration  Other (comment)    PT  Treatment/Interventions  ADLs/Self Care Home Management;Canalith Repostioning;DME Instruction;Gait training;Stair training;Functional mobility training;Therapeutic activities;Therapeutic exercise;Balance training;Neuromuscular re-education;Patient/family education;Orthotic Fit/Training;Manual techniques;Vestibular    PT Next Visit Plan   continue with balance training with emphasis on left LE weight bearing/strengthening, gait with cane with rubber quad tip    PT Home Exercise Plan  [id number]     Consulted and Agree with Plan of Care  Patient;Family member/caregiver    Family Member Consulted  dtr, [name]       Patient will benefit from skilled therapeutic intervention in order to improve the following deficits and impairments:  Abnormal gait, Decreased activity tolerance, Decreased balance, Decreased coordination, Decreased endurance, Decreased knowledge of use of DME, Decreased mobility, Decreased strength, Dizziness, Impaired flexibility, Impaired sensation, Postural dysfunction  Visit Diagnosis: Unsteadiness on feet  Muscle weakness (generalized)  Other lack of coordination     Problem List Patient Active Problem List   Diagnosis Date Noted  . 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 Chronic anticoagulation   . Acute ischemic right PCA stroke ([place]) 06/18/2018  . CVA (cerebral vascular accident) ([place]) 06/14/2018  . Urinary retention 06/05/2018  . Encounter for therapeutic drug monitoring 02/08/2018  . History of CVA (cerebrovascular accident) 12/20/2017  . Coronary artery disease 12/19/2017  . Mild renal insufficiency 12/19/2017  . TIA (transient ischemic attack) 12/19/2017  . Hip flexor tendinitis 05/22/2017  . Pain of both hip joints 04/25/2017  . Dyslipidemia 09/01/2015  . BPH without urinary obstruction 09/01/2015  . DM (diabetes mellitus), type 2, uncontrolled, periph vascular complic ([place]) 58/30/7460  . Essential hypertension 04/20/2009  . Cardiomyopathy, ischemic  04/20/2009  . LEFT BUNDLE BRANCH BLOCK 04/20/2009  . Chronic systolic heart failure ([place]) 04/20/2009    Cecile Sheerer, STPA 10/01/2018, 12:01 PM  [facility] [address] [place], [place], [address] Phone: [phone]   Fax:  [phone]  Name: JAMAHL LEMMONS MRN: [id number] Date of Birth: 04/01/1956

## 2018-10-01 NOTE — Therapy (Signed)
[facility] [address] [place], [place], [address] Phone: [phone]   Fax:  [phone]  Occupational Therapy Treatment  Patient Details  Name: Karl [name] MRN: [id number] Date of Birth: 1956/10/05 No data recorded  Encounter Date: 10/01/2018  OT End of Session - 10/01/18 1121    Visit Number  14    Number of Visits  29    Date for OT Re-Evaluation  11/19/18    Authorization Type  [employer] - pt has 30 visit limit for OT    Authorization - Visit Number  14    Authorization - Number of Visits  30    OT Start Time  1108    OT Stop Time  1148    OT Time Calculation (min)  40 min    Activity Tolerance  Patient tolerated treatment well    Behavior During Therapy  [facility] for tasks assessed/performed       Past Medical History:  Diagnosis Date  . Acute CVA (cerebrovascular accident) ([facility]) 06/14/2018  . Coronary artery disease    blockage   Stent Dr. Ladona Ridgel 15 years 1998  . Dyspnea   . Hypertension   . Stroke [facility]) 2019   denies residual on 06/14/2018  . Type II diabetes mellitus ([facility]) 07/2014 dx    Past Surgical History:  Procedure Laterality Date  . CORONARY ANGIOPLASTY WITH STENT PLACEMENT  1998  . FRACTURE SURGERY    . GREEN LIGHT LASER TURP (TRANSURETHRAL RESECTION OF PROSTATE N/A 06/05/2018   Procedure: GREEN LIGHT LASER TURP , TRANSURETHRAL RESECTION OF PROSTATE;  Surgeon: Crista Elliot, MD;  [facility];  Service: Urology;  Laterality: N/A;  . TIBIA FRACTURE SURGERY Left 1980s    There were no vitals filed for this visit.  Subjective Assessment - 10/01/18 1109    Subjective   just stiff    Pertinent History  CVA 06/14/18, T2DM, CAD with ICM on chronic coumadin, prior CVA 12/2017, TURP 06/05/18 for BPH    Patient Stated Goals  regain independence    Currently in Pain?  Yes      In supine, gentle joint mobs to L shoulder and PROM in flex, abduction, ER within pain tolerance.  In supine, closed  chain shoulder flex, chest press and horizontal abduction with ball with min cueing.  In sitting, functional mid-range reaching to place checkers in connect 4 slots with min facilitation/mod cues for shoulder/trunk compensation.  Placing small pegs in pegboard to copy design with L hand with min difficulty/incr time for coordination and 1 min v.c. For accuracy with copying.  Removing using in-hand manipulation with mod difficulty.       OT Short Term Goals - 09/24/18 1238      OT SHORT TERM GOAL #1   Title  I with inital HEP    Status  Achieved      OT SHORT TERM GOAL #2   Title  Pt will perform all basic ADLs modified independently.    Status  Achieved      OT SHORT TERM GOAL #3   Title  Pt will demonstrate improved fine motor coordination for ADLS as evidenced by decreasing 9 hole peg test score to 75 secs or less.    Status  On-going   79.29 secs     OT SHORT TERM GOAL #4   Title  Pt will perfrom snack prep/ basic home management with supervision.    Status  Achieved   using walker  tray, pt was provided info for purchase.     OT SHORT TERM GOAL #5   Title  Pt will verbalize understanding of compensatory strategiues for short term memory deficits.    Status  On-going   using calendar and pill box, therapist recommends use of alarms of timers     OT SHORT TERM GOAL #6   Title  PT will demonstrate improved fine motor coordination for ADL's as evidenced by decreasing 9 hole peg to 60 seconds or less. 11/19/2019Pt wil    Status  New      OT SHORT TERM GOAL #7   Title  Pt will demonstrate adequate postural alignment and control to as well as LUE strength retrieve 2 pound object from overhead shelf at 130* without drops or pain.    Status  New        OT Long Term Goals - 09/24/18 1244      OT LONG TERM GOAL #1   Title  I with updated HEP.- 11/19/2018    Time  8    Period  Weeks    Status  On-going      OT LONG TERM GOAL #2   Title  Pt will perform basic home  managmeent and cooking at a modified independent level demonstrating good safety awareness.    Time  8    Period  Weeks    Status  On-going   09/24/2018  able to do drink/simple snack only     OT LONG TERM GOAL #3   Title  Pt will navigate a busy environment and locate items with 90% or better accuracy.    Time  8    Period  Weeks    Status  On-going      OT LONG TERM GOAL #4   Title  Pt will demonstrate improved fine motor coordination for ADLs as evidenced by decreasing 9 hole peg test score to 60 secs or less. - Goal moved to STG    Time  8    Period  Weeks    Status  On-going   09/24/2018  74.43     OT LONG TERM GOAL #5   Title  Pt will demonstrate improved LUE strength as evidenced by retrieving a 3 lbs item from overhead shelf at 130 without drops.    Time  8    Period  Weeks    Status  On-going      OT LONG TERM GOAL #6   Title  Pt will demonstrate sufficient dynamic standing balance to complete bilateral activity in standing (no UE support) for at least 5 minutes.    Status  New            Plan - 10/01/18 1122    Clinical Impression Statement  Pt is progressing towards goals, but continues to need cueing for shoulder/trunk compensation with LUE functional use.    Occupational Profile and client history currently impacting functional performance   Pt was completely I and working full time prior to CVA, currently he is unable to work. CVA 06/14/18,Type II DM, CAD with ICM on chronic coumadin, prior CVA 12/2017, TURP 06/05/18 for BPH     Occupational performance deficits (Please refer to evaluation for details):  ADL's;IADL's;Work;Leisure;Social Participation    Rehab Potential  Good    Current Impairments/barriers affecting progress:  cognitive and visual perceptual deficits, decreased balance    OT Frequency  2x / week    OT Duration  8 weeks  OT Treatment/Interventions  Self-care/ADL training;Paraffin;Therapeutic exercise;DME and/or AE instruction;Functional Mobility  Training;Cognitive remediation/compensation;Balance training;Manual Therapy;Neuromuscular education;Gait Training;Fluidtherapy;Ultrasound;Aquatic Therapy;Moist Heat;Contrast Bath;Energy conservation;Passive range of motion;Therapeutic activities;Patient/family education    Plan   LUE neuro re-ed, visual perceptual skills, attention to Lt hand/side, NMR for balance, trunk control    Consulted and Agree with Plan of Care  Patient       Patient will benefit from skilled therapeutic intervention in order to improve the following deficits and impairments:  Abnormal gait, Decreased balance, Decreased endurance, Decreased mobility, Impaired vision/preception, Impaired perceived functional ability, Decreased range of motion, Decreased knowledge of precautions, Decreased cognition, Decreased activity tolerance, Decreased coordination, Decreased knowledge of use of DME, Decreased safety awareness, Decreased strength, Impaired UE functional use  Visit Diagnosis: Hemiplegia and hemiparesis following cerebral infarction affecting left non-dominant side (HCC)  Other lack of coordination  Visuospatial deficit  Cognitive social or emotional deficit following cerebral infarction  Abnormal posture  Other abnormalities of gait and mobility  Unsteadiness on feet    Problem List Patient Active Problem List   Diagnosis Date Noted  . 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 Chronic anticoagulation   . Acute ischemic right PCA stroke ([facility]) 06/18/2018  . CVA (cerebral vascular accident) ([facility]) 06/14/2018  . Urinary retention 06/05/2018  . Encounter for therapeutic drug monitoring 02/08/2018  . History of CVA (cerebrovascular accident) 12/20/2017  . Coronary artery disease 12/19/2017  . Mild renal insufficiency 12/19/2017  . TIA (transient ischemic attack) 12/19/2017  . Hip flexor tendinitis 05/22/2017  . Pain of both hip joints 04/25/2017  . Dyslipidemia 09/01/2015  . BPH without urinary obstruction  09/01/2015  . DM (diabetes mellitus), type 2, uncontrolled, periph vascular complic ([facility]CC) 07/10/2014  . Essential hypertension 04/20/2009  . Cardiomyopathy, ischemic 04/20/2009  . LEFT BUNDLE BRANCH BLOCK 04/20/2009  . Chronic systolic heart failure (HCC) 04/20/2009    [facility] 10/01/2018, 6:15 PM  [facility] [address] [place], [place], [address] Phone: [phone]   Fax:  [phone]  Name: Karl [name] MRN: [id number] Date of Birth: 1956-09-24   Willa Frater, OTR/L [facility] [address] [place], [place]  [address] [phone] phone [phone] 10/01/18 6:15 PM

## 2018-10-02 ENCOUNTER — Other Ambulatory Visit: Payer: Self-pay | Admitting: Physical Medicine and Rehabilitation

## 2018-10-02 ENCOUNTER — Other Ambulatory Visit: Payer: Self-pay | Admitting: Internal Medicine

## 2018-10-02 NOTE — Telephone Encounter (Signed)
Please refill.

## 2018-10-03 ENCOUNTER — Ambulatory Visit: Payer: Managed Care, Other (non HMO) | Admitting: Physical Therapy

## 2018-10-03 ENCOUNTER — Ambulatory Visit: Payer: Managed Care, Other (non HMO) | Admitting: Occupational Therapy

## 2018-10-03 MED ORDER — WARFARIN SODIUM 5 MG PO TABS: ORAL_TABLET | ORAL | 1 refills | Status: DC

## 2018-10-07 ENCOUNTER — Ambulatory Visit (INDEPENDENT_AMBULATORY_CARE_PROVIDER_SITE_OTHER): Payer: Self-pay | Admitting: *Deleted

## 2018-10-07 DIAGNOSIS — Z8673 Personal history of transient ischemic attack (TIA), and cerebral infarction without residual deficits: Secondary | ICD-10-CM

## 2018-10-07 DIAGNOSIS — G459 Transient cerebral ischemic attack, unspecified: Secondary | ICD-10-CM

## 2018-10-07 DIAGNOSIS — Z5181 Encounter for therapeutic drug level monitoring: Secondary | ICD-10-CM

## 2018-10-07 LAB — POCT INR: INR: 3.8 — AB (ref 2.0–3.0)

## 2018-10-07 NOTE — Patient Instructions (Signed)
Description   Hold today's dose then start taking 1.5 tablets daily except 1 tablet on Mondays, Wednesdays, and Fridays.  Recheck INR in 2 weeks.  Call the Coumadin clinic with any questions #[phone].

## 2018-10-08 ENCOUNTER — Ambulatory Visit: Payer: Self-pay | Attending: Physical Medicine & Rehabilitation | Admitting: Physical Therapy

## 2018-10-09 ENCOUNTER — Other Ambulatory Visit: Payer: Self-pay | Admitting: Physical Medicine and Rehabilitation

## 2018-10-09 ENCOUNTER — Ambulatory Visit: Payer: Self-pay | Admitting: Occupational Therapy

## 2018-10-16 ENCOUNTER — Ambulatory Visit: Payer: Self-pay | Admitting: Occupational Therapy

## 2018-10-17 ENCOUNTER — Ambulatory Visit: Payer: Self-pay | Admitting: Physical Therapy

## 2018-10-22 ENCOUNTER — Ambulatory Visit: Payer: Self-pay | Admitting: Occupational Therapy

## 2018-10-22 ENCOUNTER — Ambulatory Visit (INDEPENDENT_AMBULATORY_CARE_PROVIDER_SITE_OTHER): Payer: Self-pay | Admitting: *Deleted

## 2018-10-22 DIAGNOSIS — Z5181 Encounter for therapeutic drug level monitoring: Secondary | ICD-10-CM

## 2018-10-22 DIAGNOSIS — Z8673 Personal history of transient ischemic attack (TIA), and cerebral infarction without residual deficits: Secondary | ICD-10-CM

## 2018-10-22 DIAGNOSIS — G459 Transient cerebral ischemic attack, unspecified: Secondary | ICD-10-CM

## 2018-10-22 LAB — POCT INR: INR: 4.4 — AB (ref 2.0–3.0)

## 2018-10-22 NOTE — Patient Instructions (Signed)
Description   Hold today's dose and tomorrow take 1/2 tablet then start taking 1 tablet daily except 1.5 tablet on Sundays, Tuesdays, and Thursdays.  Recheck INR in 2 weeks.  Call the Coumadin clinic with any questions #[phone].

## 2018-10-24 ENCOUNTER — Ambulatory Visit: Payer: Self-pay | Admitting: Physical Therapy

## 2018-10-24 ENCOUNTER — Ambulatory Visit: Payer: Self-pay | Admitting: Occupational Therapy

## 2018-10-28 ENCOUNTER — Encounter: Payer: Managed Care, Other (non HMO) | Admitting: Occupational Therapy

## 2018-10-28 ENCOUNTER — Ambulatory Visit: Payer: Managed Care, Other (non HMO) | Admitting: Physical Therapy

## 2018-10-29 ENCOUNTER — Ambulatory Visit: Payer: Managed Care, Other (non HMO) | Admitting: Physical Therapy

## 2018-11-02 ENCOUNTER — Other Ambulatory Visit: Payer: Self-pay | Admitting: Internal Medicine

## 2018-11-04 ENCOUNTER — Encounter: Payer: Managed Care, Other (non HMO) | Admitting: Occupational Therapy

## 2018-11-04 ENCOUNTER — Ambulatory Visit: Payer: Managed Care, Other (non HMO) | Admitting: Physical Therapy

## 2018-11-06 ENCOUNTER — Ambulatory Visit (INDEPENDENT_AMBULATORY_CARE_PROVIDER_SITE_OTHER): Payer: Self-pay | Admitting: *Deleted

## 2018-11-06 DIAGNOSIS — G459 Transient cerebral ischemic attack, unspecified: Secondary | ICD-10-CM

## 2018-11-06 DIAGNOSIS — Z5181 Encounter for therapeutic drug level monitoring: Secondary | ICD-10-CM

## 2018-11-06 DIAGNOSIS — Z8673 Personal history of transient ischemic attack (TIA), and cerebral infarction without residual deficits: Secondary | ICD-10-CM

## 2018-11-06 LAB — POCT INR: INR: 3.7 — AB (ref 2.0–3.0)

## 2018-11-06 NOTE — Patient Instructions (Signed)
Description   Hold today's dose,  then start taking 1 tablet daily except 1.5 tablet on Tuesdays.  Recheck INR in one week.  Call the Coumadin clinic with any questions #[phone].

## 2018-11-07 ENCOUNTER — Ambulatory Visit: Payer: Managed Care, Other (non HMO) | Admitting: Physical Therapy

## 2018-11-07 ENCOUNTER — Encounter: Payer: Managed Care, Other (non HMO) | Admitting: Occupational Therapy

## 2018-11-10 ENCOUNTER — Other Ambulatory Visit: Payer: Self-pay | Admitting: Internal Medicine

## 2018-11-12 ENCOUNTER — Encounter: Payer: Managed Care, Other (non HMO) | Admitting: Occupational Therapy

## 2018-11-12 ENCOUNTER — Ambulatory Visit: Payer: Managed Care, Other (non HMO) | Admitting: Physical Therapy

## 2018-11-13 NOTE — Patient Instructions (Signed)
  Tests ordered today. Your results will be released to MyChart (or called to you) after review, usually within 72hours after test completion. If any changes need to be made, you will be notified at that same time.  All other Health Maintenance issues reviewed.   All recommended immunizations and age-appropriate screenings are up-to-date or discussed.  No immunizations administered today.   Medications reviewed and updated.  Changes include :     Your prescription(s) have been submitted to your pharmacy. Please take as directed and contact our office if you believe you are having problem(s) with the medication(s).  A referral was ordered for   Please followup in    

## 2018-11-13 NOTE — Progress Notes (Signed)
Subjective:    Patient ID: Karl [name], male    DOB: [birth year], 62 y.o.   MRN: [id number]  HPI The patient is here for follow up.    Medications and allergies reviewed with patient and updated if appropriate.  Patient Active Problem List   Diagnosis Date Noted  . 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 Chronic anticoagulation   . Acute ischemic right PCA stroke ([place]) 06/18/2018  . CVA (cerebral vascular accident) ([place]) 06/14/2018  . Urinary retention 06/05/2018  . Encounter for therapeutic drug monitoring 02/08/2018  . History of CVA (cerebrovascular accident) 12/20/2017  . Coronary artery disease 12/19/2017  . Mild renal insufficiency 12/19/2017  . TIA (transient ischemic attack) 12/19/2017  . Hip flexor tendinitis 05/22/2017  . Pain of both hip joints 04/25/2017  . Dyslipidemia 09/01/2015  . BPH without urinary obstruction 09/01/2015  . DM (diabetes mellitus), type 2, uncontrolled, periph vascular complic ([place]) 41/96/2229  . Essential hypertension 04/20/2009  . Cardiomyopathy, ischemic 04/20/2009  . LEFT BUNDLE BRANCH BLOCK 04/20/2009  . Chronic systolic heart failure ([place]) 04/20/2009    Current Outpatient Medications on File Prior to Visit  Medication Sig Dispense Refill  . acetaminophen (TYLENOL) 325 MG tablet Take 1-2 tablets (325-650 mg total) by mouth every 4 (four) hours as needed for mild pain.    . blood glucose meter kit and supplies KIT Dispense based on patient and insurance preference. Use up to four times daily as directed. (FOR E11.9). 1 each 0  . carvedilol (COREG) 12.5 MG tablet Take 1 tablet (12.5 mg total) by mouth 2 (two) times daily with a meal. Follow-up appt is due must see provider for future refills 60 tablet 0  . Lancets 30G MISC Use to check blood sugars daily 100 each 3  . metFORMIN (GLUCOPHAGE) 1000 MG tablet TAKE 1 TABLET BY MOUTH TWICE DAILY WITH A MEAL **FOLLOW  APPOINTMENT  IS  DUE  MUST  SEE  PROVIDER  FOR  REFILLS** 60 tablet 0  .  polyethylene glycol (MIRALAX / GLYCOLAX) packet Take 17 g by mouth 2 (two) times daily. 60 each 0  . potassium chloride (K-DUR) 10 MEQ tablet Take 1 tablet (10 mEq total) by mouth 2 (two) times daily. 60 tablet 0  . potassium chloride (K-DUR,KLOR-CON) 10 MEQ tablet TAKE 1 TABLET BY MOUTH TWICE DAILY 60 tablet 0  . rosuvastatin (CRESTOR) 40 MG tablet TAKE 1 TABLET BY MOUTH ONCE DAILY AT  6  PM 90 tablet 0  . warfarin (COUMADIN) 5 MG tablet Take as directed by Coumadin Clinic 45 tablet 1   No current facility-administered medications on file prior to visit.     Past Medical History:  Diagnosis Date  . Acute CVA (cerebrovascular accident) ([place]) 06/14/2018  . Coronary artery disease    blockage   Stent Dr. Lovena Le 15 years 1998  . Dyspnea   . Hypertension   . Stroke [facility]) 2019   denies residual on 06/14/2018  . Type II diabetes mellitus ([place]) 07/2014 dx    Past Surgical History:  Procedure Laterality Date  . CORONARY ANGIOPLASTY WITH STENT PLACEMENT  1998  . FRACTURE SURGERY    . GREEN LIGHT LASER TURP (TRANSURETHRAL RESECTION OF PROSTATE N/A 06/05/2018   Procedure: GREEN LIGHT LASER TURP , TRANSURETHRAL RESECTION OF PROSTATE;  Surgeon: [name], MD;  Location: [facility];  Service: Urology;  Laterality: N/A;  . TIBIA FRACTURE SURGERY Left 1980s    Social History   Socioeconomic  History  . Marital status: Married    Spouse name: Not on file  . Number of children: Not on file  . Years of education: Not on file  . Highest education level: Not on file  Occupational History  . Not on file  Social Needs  . Financial resource strain: Not on file  . Food insecurity:    Worry: Not on file    Inability: Not on file  . Transportation needs:    Medical: Not on file    Non-medical: Not on file  Tobacco Use  . Smoking status: Former Smoker    Packs/day: 2.00    Years: 5.00    Pack years: 10.00    Types: Cigarettes    Last attempt to quit: 12/04/2000    Years since quitting: 17.9    . Smokeless tobacco: Never Used  Substance and Sexual Activity  . Alcohol use: Yes    Comment: 06/14/2018 "nothing in 2019"  . Drug use: Not Currently  . Sexual activity: Not on file  Lifestyle  . Physical activity:    Days per week: Not on file    Minutes per session: Not on file  . Stress: Not on file  Relationships  . Social connections:    Talks on phone: Not on file    Gets together: Not on file    Attends religious service: Not on file    Active member of club or organization: Not on file    Attends meetings of clubs or organizations: Not on file    Relationship status: Not on file  Other Topics Concern  . Not on file  Social History Narrative   Former smoker - quit 1998 after stent -    Lives with dtr and 2 g-sons   Separated from wife in 2005, good terms   Employed with [employer] - walks at lot at work   No regular exercise    Family History  Problem Relation Age of Onset  . Diabetes Mother   . Diabetes Father   . Stroke Father 28  . Diabetes Sister   . Clotting disorder Sister 26  . Diabetes Other        nephew  . Diabetes Brother   . Diabetes Maternal Grandmother     Review of Systems     Objective:  There were no vitals filed for this visit. BP Readings from Last 3 Encounters:  08/29/18 135/82  07/22/18 136/79  07/15/18 100/70   Wt Readings from Last 3 Encounters:  07/22/18 179 lb (81.2 kg)  07/15/18 196 lb (88.9 kg)  07/05/18 198 lb 3.1 oz (89.9 kg)   There is no height or weight on file to calculate BMI.   Physical Exam       Assessment & Plan:    See Problem List for Assessment and Plan of chronic medical problems.   This encounter was created in error - please disregard.

## 2018-11-14 ENCOUNTER — Encounter: Payer: Managed Care, Other (non HMO) | Admitting: Internal Medicine

## 2018-11-14 DIAGNOSIS — Z0289 Encounter for other administrative examinations: Secondary | ICD-10-CM

## 2018-11-15 ENCOUNTER — Encounter (INDEPENDENT_AMBULATORY_CARE_PROVIDER_SITE_OTHER): Payer: Self-pay

## 2018-11-15 ENCOUNTER — Ambulatory Visit (INDEPENDENT_AMBULATORY_CARE_PROVIDER_SITE_OTHER): Payer: Self-pay

## 2018-11-15 DIAGNOSIS — Z5181 Encounter for therapeutic drug level monitoring: Secondary | ICD-10-CM

## 2018-11-15 DIAGNOSIS — G459 Transient cerebral ischemic attack, unspecified: Secondary | ICD-10-CM

## 2018-11-15 DIAGNOSIS — Z8673 Personal history of transient ischemic attack (TIA), and cerebral infarction without residual deficits: Secondary | ICD-10-CM

## 2018-11-15 LAB — POCT INR: INR: 3.7 — AB (ref 2.0–3.0)

## 2018-11-15 NOTE — Patient Instructions (Signed)
Description   Hold today's dose,  then start taking 1 tablet daily.  Recheck INR in 10 days.  Call the Coumadin clinic with any questions #[phone].

## 2018-11-25 ENCOUNTER — Telehealth: Payer: Self-pay

## 2018-11-25 NOTE — Telephone Encounter (Signed)
Called to schedule missed appt pt compliant to come in 12/27

## 2018-12-02 ENCOUNTER — Ambulatory Visit (INDEPENDENT_AMBULATORY_CARE_PROVIDER_SITE_OTHER): Payer: Self-pay | Admitting: *Deleted

## 2018-12-02 DIAGNOSIS — Z5181 Encounter for therapeutic drug level monitoring: Secondary | ICD-10-CM

## 2018-12-02 DIAGNOSIS — Z8673 Personal history of transient ischemic attack (TIA), and cerebral infarction without residual deficits: Secondary | ICD-10-CM

## 2018-12-02 DIAGNOSIS — G459 Transient cerebral ischemic attack, unspecified: Secondary | ICD-10-CM

## 2018-12-02 LAB — POCT INR: INR: 3.3 — AB (ref 2.0–3.0)

## 2018-12-02 NOTE — Patient Instructions (Signed)
Description   Hold today's dose,  then start taking 1 tablet daily except 1/2 tablet on Fridays.   Recheck INR in 2 weeks. Call the Coumadin clinic with any questions #[phone].

## 2018-12-05 ENCOUNTER — Other Ambulatory Visit: Payer: Self-pay | Admitting: Internal Medicine

## 2018-12-07 ENCOUNTER — Other Ambulatory Visit: Payer: Self-pay | Admitting: Internal Medicine

## 2018-12-12 NOTE — Progress Notes (Signed)
Subjective:    Patient ID: Karl [name], male    DOB: August 26, 1956, 63 y.o.   MRN: [id number]  HPI The patient is here for follow up.  Left wrist and hand swelling.  There is not been any change.  The wrist hurts, the hand does not.  He has difficulty bending the hand.  No redness.  He denies fever/chills.  He denies prior episodes.  The pain comes and goes.  Using the wrist causes the pain.  He denies unusual activities or injuries.   CAD, CHF, Hypertension: He is taking his medication daily. He is compliant with a low sodium diet.  He denies chest pain, palpitations, edema, shortness of breath and regular headaches. He is PT exercises.  He does not monitor his blood pressure at home.    Diabetes: He is taking his medication daily as prescribed. He is compliant with a diabetic diet. He is exercising - PT exercises, will start to use a bike. He checks his feet daily and denies foot lesions. He is up-to-date with an ophthalmology examination.   Hyperlipidemia: He is taking his medication daily. He is compliant with a low fat/cholesterol diet. He is exercising - PT. He denies myalgias.   H/o CVA with left sided weakness.  He walks with a cane.  His left shakes after walking.    Medications and allergies reviewed with patient and updated if appropriate.  Patient Active Problem List   Diagnosis Date Noted  . 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 Chronic anticoagulation   . Acute ischemic right PCA stroke ([place]) 06/18/2018  . CVA (cerebral vascular accident) ([place]) 06/14/2018  . Urinary retention 06/05/2018  . Encounter for therapeutic drug monitoring 02/08/2018  . History of CVA (cerebrovascular accident) 12/20/2017  . Coronary artery disease 12/19/2017  . TIA (transient ischemic attack) 12/19/2017  . Hip flexor tendinitis 05/22/2017  . Pain of both hip joints 04/25/2017  . Dyslipidemia 09/01/2015  . BPH without urinary obstruction 09/01/2015  . DM (diabetes mellitus), type 2,  uncontrolled, periph vascular complic ([place]) 49/44/9675  . Essential hypertension 04/20/2009  . Cardiomyopathy, ischemic 04/20/2009  . LEFT BUNDLE BRANCH BLOCK 04/20/2009  . Chronic systolic heart failure ([place]) 04/20/2009    Current Outpatient Medications on File Prior to Visit  Medication Sig Dispense Refill  . acetaminophen (TYLENOL) 325 MG tablet Take 1-2 tablets (325-650 mg total) by mouth every 4 (four) hours as needed for mild pain.    . blood glucose meter kit and supplies KIT Dispense based on patient and insurance preference. Use up to four times daily as directed. (FOR E11.9). 1 each 0  . carvedilol (COREG) 12.5 MG tablet Take 1 tablet by mouth twice daily with meals. 180 tablet 0  . Lancets 30G MISC Use to check blood sugars daily 100 each 3  . metFORMIN (GLUCOPHAGE) 1000 MG tablet TAKE 1 TABLET BY MOUTH TWICE DAILY WITH A MEAL 180 tablet 0  . polyethylene glycol (MIRALAX / GLYCOLAX) packet Take 17 g by mouth 2 (two) times daily. 60 each 0  . potassium chloride (K-DUR) 10 MEQ tablet Take 1 tablet (10 mEq total) by mouth 2 (two) times daily. 60 tablet 0  . potassium chloride (K-DUR,KLOR-CON) 10 MEQ tablet TAKE 1 TABLET BY MOUTH TWICE DAILY 180 tablet 0  . rosuvastatin (CRESTOR) 40 MG tablet TAKE 1 TABLET BY MOUTH ONCE DAILY AT  6  PM 90 tablet 0  . warfarin (COUMADIN) 5 MG tablet Take as directed by Coumadin  Clinic 45 tablet 1   No current facility-administered medications on file prior to visit.     Past Medical History:  Diagnosis Date  . Acute CVA (cerebrovascular accident) ([place]) 06/14/2018  . Coronary artery disease    blockage   Stent Dr. Lovena Le 15 years 1998  . Dyspnea   . Hypertension   . Stroke [facility]) 2019   denies residual on 06/14/2018  . Type II diabetes mellitus ([place]) 07/2014 dx    Past Surgical History:  Procedure Laterality Date  . CORONARY ANGIOPLASTY WITH STENT PLACEMENT  1998  . FRACTURE SURGERY    . GREEN LIGHT LASER TURP (TRANSURETHRAL RESECTION OF  PROSTATE N/A 06/05/2018   Procedure: GREEN LIGHT LASER TURP , TRANSURETHRAL RESECTION OF PROSTATE;  Surgeon: [name], MD;  Location: [facility];  Service: Urology;  Laterality: N/A;  . TIBIA FRACTURE SURGERY Left 1980s    Social History   Socioeconomic History  . Marital status: Married    Spouse name: Not on file  . Number of children: Not on file  . Years of education: Not on file  . Highest education level: Not on file  Occupational History  . Not on file  Social Needs  . Financial resource strain: Not on file  . Food insecurity:    Worry: Not on file    Inability: Not on file  . Transportation needs:    Medical: Not on file    Non-medical: Not on file  Tobacco Use  . Smoking status: Former Smoker    Packs/day: 2.00    Years: 5.00    Pack years: 10.00    Types: Cigarettes    Last attempt to quit: 12/04/2000    Years since quitting: 18.0  . Smokeless tobacco: Never Used  Substance and Sexual Activity  . Alcohol use: Yes    Comment: 06/14/2018 "nothing in 2019"  . Drug use: Not Currently  . Sexual activity: Not on file  Lifestyle  . Physical activity:    Days per week: Not on file    Minutes per session: Not on file  . Stress: Not on file  Relationships  . Social connections:    Talks on phone: Not on file    Gets together: Not on file    Attends religious service: Not on file    Active member of club or organization: Not on file    Attends meetings of clubs or organizations: Not on file    Relationship status: Not on file  Other Topics Concern  . Not on file  Social History Narrative   Former smoker - quit 1998 after stent -    Lives with dtr and 2 g-sons   Separated from wife in 2005, good terms   Employed with [employer] - walks at lot at work   No regular exercise    Family History  Problem Relation Age of Onset  . Diabetes Mother   . Diabetes Father   . Stroke Father 104  . Diabetes Sister   . Clotting disorder Sister 58   . Diabetes Other        nephew  . Diabetes Brother   . Diabetes Maternal Grandmother     Review of Systems  Constitutional: Negative for chills and fever.  Respiratory: Negative for cough, shortness of breath and wheezing.   Cardiovascular: Negative for chest pain, palpitations and leg swelling.  Gastrointestinal: Negative for abdominal pain and nausea.  Musculoskeletal: Positive for arthralgias.  Neurological: Negative  for light-headedness and headaches.       Objective:   Vitals:   12/13/18 0944  BP: 134/76  Pulse: 67  Resp: 16  Temp: 98.2 F (36.8 C)  SpO2: 94%   BP Readings from Last 3 Encounters:  12/13/18 134/76  08/29/18 135/82  07/22/18 136/79   Wt Readings from Last 3 Encounters:  07/22/18 179 lb (81.2 kg)  07/15/18 196 lb (88.9 kg)  07/05/18 198 lb 3.1 oz (89.9 kg)   Body mass index is 22.67 kg/m.   Physical Exam    Constitutional: Appears well-developed and well-nourished. No distress.  HENT:  Head: Normocephalic and atraumatic.  Neck: Neck supple. No tracheal deviation present. No thyromegaly present.  No cervical lymphadenopathy Cardiovascular: Normal rate, regular rhythm and normal heart sounds.     No edema Pulmonary/Chest: Effort normal and breath sounds normal. No respiratory distress. No has no wheezes. No rales.  Msk; left wrist swelling and mild warmth, no tenderness with palpation.  No pain with passive movements.   Skin: Skin is warm and dry. Not diaphoretic.  Psychiatric: Normal mood and affect. Behavior is normal.      Assessment & Plan:    See Problem List for Assessment and Plan of chronic medical problems.

## 2018-12-12 NOTE — Patient Instructions (Addendum)
  Tests ordered today. Your results will be released to MyChart (or called to you) after review, usually within 72hours after test completion. If any changes need to be made, you will be notified at that same time.  Medications reviewed and updated.  Changes include :   none      Please followup in 6 months   

## 2018-12-13 ENCOUNTER — Encounter: Payer: Self-pay | Admitting: Internal Medicine

## 2018-12-13 ENCOUNTER — Other Ambulatory Visit: Payer: Self-pay

## 2018-12-13 ENCOUNTER — Ambulatory Visit: Payer: Self-pay | Admitting: Family Medicine

## 2018-12-13 ENCOUNTER — Ambulatory Visit (INDEPENDENT_AMBULATORY_CARE_PROVIDER_SITE_OTHER): Payer: Self-pay | Admitting: Internal Medicine

## 2018-12-13 ENCOUNTER — Other Ambulatory Visit (INDEPENDENT_AMBULATORY_CARE_PROVIDER_SITE_OTHER): Payer: Self-pay

## 2018-12-13 VITALS — BP 134/76 | HR 67 | Temp 98.2°F | Resp 16 | Ht 74.5 in

## 2018-12-13 DIAGNOSIS — E1151 Type 2 diabetes mellitus with diabetic peripheral angiopathy without gangrene: Secondary | ICD-10-CM

## 2018-12-13 DIAGNOSIS — I5022 Chronic systolic (congestive) heart failure: Secondary | ICD-10-CM

## 2018-12-13 DIAGNOSIS — E785 Hyperlipidemia, unspecified: Secondary | ICD-10-CM

## 2018-12-13 DIAGNOSIS — I10 Essential (primary) hypertension: Secondary | ICD-10-CM

## 2018-12-13 DIAGNOSIS — IMO0002 Reserved for concepts with insufficient information to code with codable children: Secondary | ICD-10-CM

## 2018-12-13 DIAGNOSIS — M25532 Pain in left wrist: Secondary | ICD-10-CM

## 2018-12-13 DIAGNOSIS — I2510 Atherosclerotic heart disease of native coronary artery without angina pectoris: Secondary | ICD-10-CM

## 2018-12-13 DIAGNOSIS — E1165 Type 2 diabetes mellitus with hyperglycemia: Secondary | ICD-10-CM

## 2018-12-13 LAB — CBC WITH DIFFERENTIAL/PLATELET
Basophils Absolute: 0 10*3/uL (ref 0.0–0.1)
Basophils Relative: 0.7 % (ref 0.0–3.0)
Eosinophils Absolute: 0.1 10*3/uL (ref 0.0–0.7)
Eosinophils Relative: 1.6 % (ref 0.0–5.0)
HCT: 39.7 % (ref 39.0–52.0)
Hemoglobin: 13.1 g/dL (ref 13.0–17.0)
Lymphocytes Relative: 27.9 % (ref 12.0–46.0)
Lymphs Abs: 1.4 10*3/uL (ref 0.7–4.0)
MCHC: 32.9 g/dL (ref 30.0–36.0)
MCV: 74.8 fl — ABNORMAL LOW (ref 78.0–100.0)
Monocytes Absolute: 0.6 10*3/uL (ref 0.1–1.0)
Monocytes Relative: 13.1 % — ABNORMAL HIGH (ref 3.0–12.0)
Neutro Abs: 2.8 10*3/uL (ref 1.4–7.7)
Neutrophils Relative %: 56.7 % (ref 43.0–77.0)
Platelets: 233 10*3/uL (ref 150.0–400.0)
RBC: 5.32 Mil/uL (ref 4.22–5.81)
RDW: 15.6 % — ABNORMAL HIGH (ref 11.5–15.5)
WBC: 4.9 10*3/uL (ref 4.0–10.5)

## 2018-12-13 LAB — LIPID PANEL
Cholesterol: 100 mg/dL (ref 0–200)
HDL: 35.8 mg/dL — ABNORMAL LOW (ref >39.00)
LDL Cholesterol: 40 mg/dL (ref 0–99)
NonHDL: 64.46
Total CHOL/HDL Ratio: 3
Triglycerides: 121 mg/dL (ref 0.0–149.0)
VLDL: 24.2 mg/dL (ref 0.0–40.0)

## 2018-12-13 LAB — COMPREHENSIVE METABOLIC PANEL
ALT: 11 U/L (ref 0–53)
AST: 13 U/L (ref 0–37)
Albumin: 4 g/dL (ref 3.5–5.2)
Alkaline Phosphatase: 60 U/L (ref 39–117)
BUN: 10 mg/dL (ref 6–23)
CALCIUM: 10.4 mg/dL (ref 8.4–10.5)
CO2: 30 meq/L (ref 19–32)
Chloride: 102 mEq/L (ref 96–112)
Creatinine, Ser: 1.39 mg/dL (ref 0.40–1.50)
GFR: 66.11 mL/min (ref >60.00)
GLUCOSE: 148 mg/dL — AB (ref 70–99)
Potassium: 4.2 mEq/L (ref 3.5–5.1)
Sodium: 140 mEq/L (ref 135–145)
Total Bilirubin: 0.6 mg/dL (ref 0.2–1.2)
Total Protein: 7.2 g/dL (ref 6.0–8.3)

## 2018-12-13 LAB — MICROALBUMIN / CREATININE URINE RATIO
Creatinine,U: 137.1 mg/dL
Microalb Creat Ratio: 32.7 mg/g — ABNORMAL HIGH (ref 0.0–30.0)
Microalb, Ur: 44.9 mg/dL — ABNORMAL HIGH (ref 0.0–1.9)

## 2018-12-13 LAB — HEMOGLOBIN A1C: Hgb A1c MFr Bld: 8.5 % — ABNORMAL HIGH (ref 4.6–6.5)

## 2018-12-13 MED ORDER — PREDNISONE 5 MG PO TABS: ORAL_TABLET | ORAL | 0 refills | Status: DC

## 2018-12-13 NOTE — Progress Notes (Signed)
Limited ultrasound: left wrist:  Normal appearing first dorsal compartment  CMC joint with mild degenerative changes  Dorsal compartments with no effusion  Appears to be an effusion in the carpal joints. Hyperlucency to suggest degenerative changes of the carpal joints.   Summary: findings suggest effusion and degenerative changes of the carpal joints.   Ultrasound and interpretation by Clare Gandy, MD   Right wrist pain:  No inciting event and no trauma.  History of stroke and left-sided hemiparesis. No history of gout. Findings on [place] would suggest contribution of pain and swelling from arthritic changes. History of HF with ECHO on 06/15/18 LV EF 20% - try prednisone. Hgb A1c 6.8.  - counseled on HEP and supportive care - if no improvement would consider imaging and injection.

## 2018-12-14 NOTE — Assessment & Plan Note (Signed)
euvolemic on exam Continue current medications cmp 

## 2018-12-14 NOTE — Assessment & Plan Note (Signed)
Check lipid panel  Continue daily statin Regular exercise and healthy diet encouraged  

## 2018-12-14 NOTE — Assessment & Plan Note (Signed)
BP well controlled Current regimen effective and well tolerated Continue current medications at current doses cmp  

## 2018-12-14 NOTE — Assessment & Plan Note (Signed)
Asymptomatic On statin, coreg, warfarin Increase activity Lipid, cmp

## 2018-12-14 NOTE — Assessment & Plan Note (Signed)
A1c, urine micro Increase activity level Low sugar/carb diet F/u in 6 months

## 2018-12-14 NOTE — Assessment & Plan Note (Signed)
Pain intermittent, no obvious cause Unlikely gout ? Tendinitis Dr Jordan Likes to see today

## 2018-12-16 ENCOUNTER — Ambulatory Visit (INDEPENDENT_AMBULATORY_CARE_PROVIDER_SITE_OTHER): Payer: Self-pay | Admitting: Pharmacist

## 2018-12-16 DIAGNOSIS — Z5181 Encounter for therapeutic drug level monitoring: Secondary | ICD-10-CM

## 2018-12-16 DIAGNOSIS — G459 Transient cerebral ischemic attack, unspecified: Secondary | ICD-10-CM

## 2018-12-16 DIAGNOSIS — Z8673 Personal history of transient ischemic attack (TIA), and cerebral infarction without residual deficits: Secondary | ICD-10-CM

## 2018-12-16 LAB — POCT INR: INR: 2.6 (ref 2.0–3.0)

## 2018-12-16 NOTE — Patient Instructions (Signed)
Description   Continue taking 1 tablet daily except 1/2 tablet on Fridays - EXCEPT take 1/2 tablet this Wednesday since you recently started prednisone.  Recheck INR in 2 weeks. Call the Coumadin clinic with any questions #[phone].

## 2019-01-02 ENCOUNTER — Ambulatory Visit (INDEPENDENT_AMBULATORY_CARE_PROVIDER_SITE_OTHER): Payer: Self-pay | Admitting: *Deleted

## 2019-01-02 DIAGNOSIS — G459 Transient cerebral ischemic attack, unspecified: Secondary | ICD-10-CM

## 2019-01-02 DIAGNOSIS — Z5181 Encounter for therapeutic drug level monitoring: Secondary | ICD-10-CM

## 2019-01-02 DIAGNOSIS — Z8673 Personal history of transient ischemic attack (TIA), and cerebral infarction without residual deficits: Secondary | ICD-10-CM

## 2019-01-02 LAB — POCT INR: INR: 4 — AB (ref 2.0–3.0)

## 2019-01-02 NOTE — Patient Instructions (Signed)
Description   Do not take any Coumadin today then continue taking 1 tablet daily except 1/2 tablet on Fridays.  Recheck INR in 2 weeks. Call the Coumadin clinic with any questions #[phone].

## 2019-01-15 ENCOUNTER — Ambulatory Visit (INDEPENDENT_AMBULATORY_CARE_PROVIDER_SITE_OTHER): Payer: Self-pay | Admitting: *Deleted

## 2019-01-15 DIAGNOSIS — Z8673 Personal history of transient ischemic attack (TIA), and cerebral infarction without residual deficits: Secondary | ICD-10-CM

## 2019-01-15 DIAGNOSIS — G459 Transient cerebral ischemic attack, unspecified: Secondary | ICD-10-CM

## 2019-01-15 DIAGNOSIS — Z5181 Encounter for therapeutic drug level monitoring: Secondary | ICD-10-CM

## 2019-01-15 LAB — POCT INR: INR: 3.6 — AB (ref 2.0–3.0)

## 2019-01-15 NOTE — Patient Instructions (Signed)
Description   Do not take any Coumadin today then start taking 1 tablet daily except 1/2 tablet on Monday and Fridays.  Recheck INR in 2 weeks. Call the Coumadin clinic with any questions #[phone].

## 2019-01-30 ENCOUNTER — Ambulatory Visit (INDEPENDENT_AMBULATORY_CARE_PROVIDER_SITE_OTHER): Payer: Self-pay | Admitting: Pharmacist

## 2019-01-30 DIAGNOSIS — Z5181 Encounter for therapeutic drug level monitoring: Secondary | ICD-10-CM

## 2019-01-30 DIAGNOSIS — G459 Transient cerebral ischemic attack, unspecified: Secondary | ICD-10-CM

## 2019-01-30 DIAGNOSIS — Z8673 Personal history of transient ischemic attack (TIA), and cerebral infarction without residual deficits: Secondary | ICD-10-CM

## 2019-01-30 LAB — POCT INR: INR: 5.6 — AB (ref 2.0–3.0)

## 2019-01-30 NOTE — Patient Instructions (Signed)
Description   Do not take any Coumadin today, tomorrow, or Saturday, then start taking 1/2 tablet daily except 1 tablet on Sundays, Tuesdays, and Thursdays.  Recheck INR in 10 day. Call the Coumadin clinic with any questions #[phone].

## 2019-02-10 ENCOUNTER — Ambulatory Visit (INDEPENDENT_AMBULATORY_CARE_PROVIDER_SITE_OTHER): Payer: Self-pay | Admitting: Pharmacist

## 2019-02-10 DIAGNOSIS — Z5181 Encounter for therapeutic drug level monitoring: Secondary | ICD-10-CM

## 2019-02-10 DIAGNOSIS — Z8673 Personal history of transient ischemic attack (TIA), and cerebral infarction without residual deficits: Secondary | ICD-10-CM

## 2019-02-10 DIAGNOSIS — G459 Transient cerebral ischemic attack, unspecified: Secondary | ICD-10-CM

## 2019-02-10 LAB — POCT INR: INR: 2.2 (ref 2.0–3.0)

## 2019-02-10 NOTE — Patient Instructions (Signed)
Description   Continue 1/2 tablet daily except 1 tablet on Sundays, Tuesdays, and Thursdays.  Recheck INR in 2 weeks. Call the Coumadin clinic with any questions #[phone].

## 2019-02-21 ENCOUNTER — Telehealth: Payer: Self-pay

## 2019-02-21 NOTE — Telephone Encounter (Signed)
UNABLE TO LMOM NO VOICEMAIL FOR PRESCREEN

## 2019-03-05 ENCOUNTER — Other Ambulatory Visit: Payer: Self-pay | Admitting: Internal Medicine

## 2019-03-12 ENCOUNTER — Other Ambulatory Visit: Payer: Self-pay | Admitting: Internal Medicine

## 2019-03-12 ENCOUNTER — Other Ambulatory Visit: Payer: Self-pay

## 2019-05-26 ENCOUNTER — Other Ambulatory Visit: Payer: Self-pay | Admitting: Internal Medicine

## 2019-05-27 ENCOUNTER — Telehealth: Payer: Self-pay

## 2019-05-27 NOTE — Telephone Encounter (Signed)
Unable to lmom for prescreen vm not set up

## 2019-05-27 NOTE — Telephone Encounter (Signed)
Called spoke with pt's daughter, made appt in Coumadin Clinic for 06/03/19 at 11:15am.  Pt is overdue for follow-up, last INR was in March.  Pt is out of Coumadin, will refill x 1 week to get to appt.  Pt will need 30 day refill at [facility].  Note placed on appt.

## 2019-06-03 ENCOUNTER — Telehealth: Payer: Self-pay | Admitting: *Deleted

## 2019-06-03 ENCOUNTER — Other Ambulatory Visit: Payer: Self-pay | Admitting: *Deleted

## 2019-06-03 MED ORDER — WARFARIN SODIUM 5 MG PO TABS: ORAL_TABLET | ORAL | 0 refills | Status: DC

## 2019-06-03 NOTE — Telephone Encounter (Signed)
1. COVID-19 Pre-Screening Questions:  . In the past 7 to 10 days have you had a cough,  shortness of breath, headache, congestion, fever (100 or greater) body aches, chills, sore throat, or sudden loss of taste or sense of smell? No . Have you been around anyone with known Covid 19? No . Have you been around anyone who is awaiting Covid 19 test results in the past 7 to 10 days? No . Have you been around anyone who has been exposed to Covid 19, or has mentioned symptoms of Covid 19 within the past 7 to 10 days? No   2. Pt advised of visitor restrictions (no visitors allowed except if needed to conduct the visit). Also advised to arrive at appointment time and wear a mask.   Daughter aware to be on time as they missed appt on 06/03/2019 and will run out of meds tonight.

## 2019-06-03 NOTE — Telephone Encounter (Signed)
Daughter called the scheduling dept and stated they had missed appt today. Confirmed that they did and she asked if they could just come on in and instructed that they could not as we have structure and can only see a pt per scheduled appt. Pt will run out of Coumadin tonight so sent in 2 tabs to get to appt on Thursday, also advised not to be late as this is not permitted as others have scheduled times as well.

## 2019-06-05 ENCOUNTER — Telehealth: Payer: Self-pay

## 2019-06-05 ENCOUNTER — Ambulatory Visit (INDEPENDENT_AMBULATORY_CARE_PROVIDER_SITE_OTHER): Payer: Self-pay | Admitting: *Deleted

## 2019-06-05 ENCOUNTER — Other Ambulatory Visit: Payer: Self-pay

## 2019-06-05 DIAGNOSIS — G459 Transient cerebral ischemic attack, unspecified: Secondary | ICD-10-CM

## 2019-06-05 DIAGNOSIS — Z8673 Personal history of transient ischemic attack (TIA), and cerebral infarction without residual deficits: Secondary | ICD-10-CM

## 2019-06-05 DIAGNOSIS — Z5181 Encounter for therapeutic drug level monitoring: Secondary | ICD-10-CM

## 2019-06-05 LAB — POCT INR: INR: 1.4 — AB (ref 2.0–3.0)

## 2019-06-05 MED ORDER — WARFARIN SODIUM 5 MG PO TABS: ORAL_TABLET | ORAL | 0 refills | Status: DC

## 2019-06-05 NOTE — Patient Instructions (Addendum)
  Description    Take 1.5 tablets today and 1 tablet tomorrow, then change dose to 1 tablet daily except for 1/2 a tablet on Monday, Wednesday and Friday.  Recheck INR in 1 week. Call the Coumadin clinic with any questions [phone].

## 2019-06-05 NOTE — Telephone Encounter (Signed)

## 2019-06-05 NOTE — Patient Instructions (Signed)
Description    Take 1.5 tablets today and 1 tablet tomorrow, then continue 1/2 tablet daily except 1 tablet on Sundays, Tuesdays, and Thursdays.  Recheck INR in 1 weeks. Call the Coumadin clinic with any questions [phone].

## 2019-06-08 ENCOUNTER — Other Ambulatory Visit: Payer: Self-pay | Admitting: Internal Medicine

## 2019-06-12 ENCOUNTER — Telehealth: Payer: Self-pay

## 2019-06-12 ENCOUNTER — Other Ambulatory Visit: Payer: Self-pay

## 2019-06-12 ENCOUNTER — Ambulatory Visit (INDEPENDENT_AMBULATORY_CARE_PROVIDER_SITE_OTHER): Payer: Self-pay | Admitting: *Deleted

## 2019-06-12 DIAGNOSIS — G459 Transient cerebral ischemic attack, unspecified: Secondary | ICD-10-CM

## 2019-06-12 DIAGNOSIS — Z8673 Personal history of transient ischemic attack (TIA), and cerebral infarction without residual deficits: Secondary | ICD-10-CM

## 2019-06-12 DIAGNOSIS — Z5181 Encounter for therapeutic drug level monitoring: Secondary | ICD-10-CM

## 2019-06-12 LAB — POCT INR: INR: 1.6 — AB (ref 2.0–3.0)

## 2019-06-12 NOTE — Telephone Encounter (Signed)

## 2019-06-15 NOTE — Progress Notes (Deleted)
  Subjective:    Patient ID: Karl [name], male    DOB: 10/27/1956, 63 y.o.   MRN: [id number]  HPI The patient is here for follow up.  CAd, CHF, Hypertension: He is taking his medication daily. He is compliant with a low sodium diet.  He denies chest pain, palpitations, edema, shortness of breath and regular headaches.  He does not monitor his blood pressure at home.    Diabetes: He is taking his medication daily as prescribed. He is compliant with a diabetic diet.  He monitors his sugars and they have been running XXX. He checks his feet daily and denies foot lesions. He is up-to-date with an ophthalmology examination.   Hyperlipidemia: He is taking his medication daily. He is compliant with a low fat/cholesterol diet. He denies myalgias.   H/o CVA with left sided weakness:     Medications and allergies reviewed with patient and updated if appropriate.  Patient Active Problem List   Diagnosis Date Noted  . Left wrist pain 12/13/2018  . 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 Chronic anticoagulation   . Acute ischemic right PCA stroke ([facility]) 06/18/2018  . CVA (cerebral vascular accident) ([facility]) 06/14/2018  . Urinary retention 06/05/2018  . Encounter for therapeutic drug monitoring 02/08/2018  . History of CVA (cerebrovascular accident) 12/20/2017  . Coronary artery disease 12/19/2017  . TIA (transient ischemic attack) 12/19/2017  . Hip flexor tendinitis 05/22/2017  . Pain of both hip joints 04/25/2017  . Dyslipidemia 09/01/2015  . BPH without urinary obstruction 09/01/2015  . DM (diabetes mellitus), type 2, uncontrolled, periph vascular complic ([facility]) 07/10/2014  . Essential hypertension 04/20/2009  . Cardiomyopathy, ischemic 04/20/2009  . LEFT BUNDLE BRANCH BLOCK 04/20/2009  . Chronic systolic heart failure ([facility]) 04/20/2009    Current Outpatient Medications on File Prior to Visit  Medication Sig Dispense Refill  . acetaminophen (TYLENOL) 325 MG tablet Take 1-2 tablets  (325-650 mg total) by mouth every 4 (four) hours as needed for mild pain.    . blood glucose meter kit and supplies KIT Dispense based on patient and insurance preference. Use up to four times daily as directed. (FOR E11.9). 1 each 0  . carvedilol (COREG) 12.5 MG tablet TAKE 1 TABLET BY MOUTH TWICE DAILY WITH MEALS 180 tablet 0  . Lancets 30G MISC Use to check blood sugars daily 100 each 3  . metFORMIN (GLUCOPHAGE) 1000 MG tablet TAKE 1 TABLET BY MOUTH TWICE DAILY WITH A MEAL 180 tablet 0  . polyethylene glycol (MIRALAX / GLYCOLAX) packet Take 17 g by mouth 2 (two) times daily. 60 each 0  . potassium chloride (K-DUR) 10 MEQ tablet Take 1 tablet (10 mEq total) by mouth 2 (two) times daily. 60 tablet 0  . potassium chloride (K-DUR,KLOR-CON) 10 MEQ tablet Take 1 tablet by mouth twice daily 180 tablet 0  . rosuvastatin (CRESTOR) 40 MG tablet TAKE 1 TABLET BY MOUTH ONCE DAILY AT  6PM 90 tablet 0  . warfarin (COUMADIN) 5 MG tablet Take as directed by Coumadin Clinic 30 tablet 0   No current facility-administered medications on file prior to visit.     Past Medical History:  Diagnosis Date  . Acute CVA (cerebrovascular accident) ([facility]) 06/14/2018  . Coronary artery disease    blockage   Stent Dr. Taylor 15 years 1998  . Dyspnea   . Hypertension   . Stroke ([facility]) 2019   denies residual on 06/14/2018  . Type II diabetes mellitus ([facility]) 07/2014 dx      Past Surgical History:  Procedure Laterality Date  . CORONARY ANGIOPLASTY WITH STENT PLACEMENT  1998  . FRACTURE SURGERY    . GREEN LIGHT LASER TURP (TRANSURETHRAL RESECTION OF PROSTATE N/A 06/05/2018   Procedure: GREEN LIGHT LASER TURP , TRANSURETHRAL RESECTION OF PROSTATE;  Surgeon: Bell, Eugene D III, MD;  Location: [facility];  Service: Urology;  Laterality: N/A;  . TIBIA FRACTURE SURGERY Left 1980s    Social History   Socioeconomic History  . Marital status: Married    Spouse name: Not on file  . Number of children: Not on file  . Years of  education: Not on file  . Highest education level: Not on file  Occupational History  . Not on file  Social Needs  . Financial resource strain: Not on file  . Food insecurity    Worry: Not on file    Inability: Not on file  . Transportation needs    Medical: Not on file    Non-medical: Not on file  Tobacco Use  . Smoking status: Former Smoker    Packs/day: 2.00    Years: 5.00    Pack years: 10.00    Types: Cigarettes    Quit date: 12/04/2000    Years since quitting: 18.5  . Smokeless tobacco: Never Used  Substance and Sexual Activity  . Alcohol use: Yes    Comment: 06/14/2018 "nothing in 2019"  . Drug use: Not Currently  . Sexual activity: Not on file  Lifestyle  . Physical activity    Days per week: Not on file    Minutes per session: Not on file  . Stress: Not on file  Relationships  . Social connections    Talks on phone: Not on file    Gets together: Not on file    Attends religious service: Not on file    Active member of club or organization: Not on file    Attends meetings of clubs or organizations: Not on file    Relationship status: Not on file  Other Topics Concern  . 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 Diabetes Mother   . Diabetes Father   . Stroke Father 65  . Diabetes Sister   . Clotting disorder Sister 45  . Diabetes Other        nephew  . Diabetes Brother   . Diabetes Maternal Grandmother     Review of Systems     Objective:  There were no vitals filed for this visit. BP Readings from Last 3 Encounters:  12/13/18 134/76  08/29/18 135/82  07/22/18 136/79   Wt Readings from Last 3 Encounters:  07/22/18 179 lb (81.2 kg)  07/15/18 196 lb (88.9 kg)  07/05/18 198 lb 3.1 oz  (89.9 kg)   There is no height or weight on file to calculate BMI.   Physical Exam    Constitutional: Appears well-developed and well-nourished. No distress.  HENT:  Head: Normocephalic and atraumatic.  Neck: Neck supple. No tracheal deviation present. No thyromegaly present.  No cervical lymphadenopathy Cardiovascular: Normal rate, regular rhythm and normal heart sounds.   No murmur heard. No carotid bruit .  No edema   Pulmonary/Chest: Effort normal and breath sounds normal. No respiratory distress. No has no wheezes. No rales.  Skin: Skin is warm and dry. Not diaphoretic.  Psychiatric: Normal mood and affect. Behavior is normal.      Assessment & Plan:    See Problem List for Assessment and Plan of chronic medical problems.   

## 2019-06-16 ENCOUNTER — Ambulatory Visit: Payer: Self-pay | Admitting: Internal Medicine

## 2019-06-16 DIAGNOSIS — Z0289 Encounter for other administrative examinations: Secondary | ICD-10-CM

## 2019-06-19 ENCOUNTER — Ambulatory Visit (INDEPENDENT_AMBULATORY_CARE_PROVIDER_SITE_OTHER): Payer: Self-pay | Admitting: *Deleted

## 2019-06-19 ENCOUNTER — Other Ambulatory Visit: Payer: Self-pay

## 2019-06-19 DIAGNOSIS — G459 Transient cerebral ischemic attack, unspecified: Secondary | ICD-10-CM

## 2019-06-19 DIAGNOSIS — Z5181 Encounter for therapeutic drug level monitoring: Secondary | ICD-10-CM

## 2019-06-19 DIAGNOSIS — Z8673 Personal history of transient ischemic attack (TIA), and cerebral infarction without residual deficits: Secondary | ICD-10-CM

## 2019-06-19 LAB — POCT INR: INR: 2.4 (ref 2.0–3.0)

## 2019-06-19 NOTE — Patient Instructions (Signed)
Description   Continue to take 1 tablet daily except for 1/2 a tablet on Monday, Wednesday and Friday.  Recheck INR in 2 weeks. Call the Coumadin clinic with any questions [phone].

## 2019-06-20 ENCOUNTER — Other Ambulatory Visit: Payer: Self-pay | Admitting: Internal Medicine

## 2019-06-23 NOTE — Progress Notes (Signed)
Subjective:    Patient ID: Karl [name], male    DOB: 06-10-1956, 63 y.o.   MRN: [id number]  HPI The patient is here for follow up.  Blisters on back, itching:  There were there a couple of days.  They itch.  He denies pain.    CAd, CHF, Hypertension: He is taking his medication daily. He is compliant with a low sodium diet.  He denies chest pain, palpitations, edema, shortness of breath and regular headaches.  He does not monitor his blood pressure at home.    Diabetes: He is taking his medication daily as prescribed. He is compliant with a diabetic diet.   Marland Kitchen He checks his feet daily and denies foot lesions. He is not up-to-date with an ophthalmology examination.   Hyperlipidemia: He is taking his medication daily. He is compliant with a low fat/cholesterol diet. He denies myalgias.   H/o CVA with left sided weakness: His left-sided rib pain.  He denies any new numbness/tingling or weakness.  He is taking the warfarin, Crestor and blood pressure medications daily.    Medications and allergies reviewed with patient and updated if appropriate.  Patient Active Problem List   Diagnosis Date Noted  . Left wrist pain 12/13/2018  . 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 Chronic anticoagulation   . Acute ischemic right PCA stroke ([place]) 06/18/2018  . CVA (cerebral vascular accident) ([place]) 06/14/2018  . Urinary retention 06/05/2018  . Encounter for therapeutic drug monitoring 02/08/2018  . History of CVA (cerebrovascular accident) 12/20/2017  . Coronary artery disease 12/19/2017  . TIA (transient ischemic attack) 12/19/2017  . Hip flexor tendinitis 05/22/2017  . Pain of both hip joints 04/25/2017  . Dyslipidemia 09/01/2015  . BPH without urinary obstruction 09/01/2015  . DM (diabetes mellitus), type 2, uncontrolled, periph vascular complic ([place]) 35/46/5681  . Essential hypertension 04/20/2009  . Cardiomyopathy, ischemic 04/20/2009  . LEFT BUNDLE BRANCH BLOCK 04/20/2009  .  Chronic systolic heart failure ([place]) 04/20/2009    Current Outpatient Medications on File Prior to Visit  Medication Sig Dispense Refill  . acetaminophen (TYLENOL) 325 MG tablet Take 1-2 tablets (325-650 mg total) by mouth every 4 (four) hours as needed for mild pain.    . blood glucose meter kit and supplies KIT Dispense based on patient and insurance preference. Use up to four times daily as directed. (FOR E11.9). 1 each 0  . carvedilol (COREG) 12.5 MG tablet TAKE 1 TABLET BY MOUTH TWICE DAILY WITH MEALS 60 tablet 0  . Lancets 30G MISC Use to check blood sugars daily 100 each 3  . metFORMIN (GLUCOPHAGE) 1000 MG tablet TAKE 1 TABLET BY MOUTH TWICE DAILY WITH A MEAL 180 tablet 0  . polyethylene glycol (MIRALAX / GLYCOLAX) packet Take 17 g by mouth 2 (two) times daily. 60 each 0  . potassium chloride (K-DUR) 10 MEQ tablet Take 1 tablet (10 mEq total) by mouth 2 (two) times daily. 60 tablet 0  . potassium chloride (K-DUR) 10 MEQ tablet TAKE 1  BY MOUTH TWICE DAILY 60 tablet 0  . rosuvastatin (CRESTOR) 40 MG tablet Take 1 tablet by mouth daily at 6 pm 30 tablet 0  . warfarin (COUMADIN) 5 MG tablet Take as directed by Coumadin Clinic 30 tablet 0   No current facility-administered medications on file prior to visit.     Past Medical History:  Diagnosis Date  . Acute CVA (cerebrovascular accident) ([place]) 06/14/2018  . Coronary artery disease  blockage   Stent Dr. Lovena Le 15 years 1998  . Dyspnea   . Hypertension   . Stroke [facility]) 2019   denies residual on 06/14/2018  . Type II diabetes mellitus ([place]) 07/2014 dx    Past Surgical History:  Procedure Laterality Date  . CORONARY ANGIOPLASTY WITH STENT PLACEMENT  1998  . FRACTURE SURGERY    . GREEN LIGHT LASER TURP (TRANSURETHRAL RESECTION OF PROSTATE N/A 06/05/2018   Procedure: GREEN LIGHT LASER TURP , TRANSURETHRAL RESECTION OF PROSTATE;  Surgeon: [name], MD;  Location: [facility];  Service: Urology;  Laterality: N/A;  . TIBIA FRACTURE  SURGERY Left 1980s    Social History   Socioeconomic History  . Marital status: Married    Spouse name: Not on file  . Number of children: Not on file  . Years of education: Not on file  . Highest education level: Not on file  Occupational History  . Not on file  Social Needs  . Financial resource strain: Not on file  . Food insecurity    Worry: Not on file    Inability: Not on file  . Transportation needs    Medical: Not on file    Non-medical: Not on file  Tobacco Use  . Smoking status: Former Smoker    Packs/day: 2.00    Years: 5.00    Pack years: 10.00    Types: Cigarettes    Quit date: 12/04/2000    Years since quitting: 18.5  . Smokeless tobacco: Never Used  Substance and Sexual Activity  . Alcohol use: Yes    Comment: 06/14/2018 "nothing in 2019"  . Drug use: Not Currently  . Sexual activity: Not on file  Lifestyle  . Physical activity    Days per week: Not on file    Minutes per session: Not on file  . Stress: Not on file  Relationships  . Social Herbalist on phone: Not on file    Gets together: Not on file    Attends religious service: Not on file    Active member of club or organization: Not on file    Attends meetings of clubs or organizations: Not on file    Relationship status: Not on file  Other Topics Concern  . Not on file  Social History Narrative   Former smoker - quit 1998 after stent -    Lives with dtr and 2 g-sons   Separated from wife in 2005, good terms   Employed with [employer] - walks at lot at work   No regular exercise    Family History  Problem Relation Age of Onset  . Diabetes Mother   . Diabetes Father   . Stroke Father 61  . Diabetes Sister   . Clotting disorder Sister 63  . Diabetes Other        nephew  . Diabetes Brother   . Diabetes Maternal Grandmother     Review of Systems  Constitutional: Negative for chills and fever.  Respiratory: Negative for cough, shortness of breath  and wheezing.   Cardiovascular: Negative for chest pain, palpitations and leg swelling.  Neurological: Negative for light-headedness and headaches.       Objective:   Vitals:   06/24/19 1010  BP: 140/88  Pulse: 74  Resp: 16  Temp: 98.6 F (37 C)  SpO2: 99%   BP Readings from Last 3 Encounters:  06/24/19 140/88  12/13/18 134/76  08/29/18 135/82   Wt  Readings from Last 3 Encounters:  07/22/18 179 lb (81.2 kg)  07/15/18 196 lb (88.9 kg)  07/05/18 198 lb 3.1 oz (89.9 kg)   Body mass index is 22.67 kg/m.   Physical Exam    Constitutional: Appears well-developed and well-nourished. No distress.  HENT:  Head: Normocephalic and atraumatic.  Neck: Neck supple. No tracheal deviation present. No thyromegaly present.  No cervical lymphadenopathy Cardiovascular: Normal rate, regular rhythm and normal heart sounds.   No murmur heard. No carotid bruit .  Trace bilateral lower extremity edema Pulmonary/Chest: Effort normal and breath sounds normal. No respiratory distress. No has no wheezes. No rales.  Skin: Skin is warm and dry. Not diaphoretic.  Clusters of blisters right upper back and right sided upper chest without surrounding erythema, couple areas of excoriation from scratching, no open wound or active discharge Psychiatric: Normal mood and affect. Behavior is normal.      Assessment & Plan:    See Problem List for Assessment and Plan of chronic medical problems.

## 2019-06-24 ENCOUNTER — Encounter: Payer: Self-pay | Admitting: Internal Medicine

## 2019-06-24 ENCOUNTER — Ambulatory Visit (INDEPENDENT_AMBULATORY_CARE_PROVIDER_SITE_OTHER): Payer: Self-pay | Admitting: Internal Medicine

## 2019-06-24 ENCOUNTER — Other Ambulatory Visit: Payer: Self-pay

## 2019-06-24 ENCOUNTER — Other Ambulatory Visit (INDEPENDENT_AMBULATORY_CARE_PROVIDER_SITE_OTHER): Payer: Self-pay

## 2019-06-24 VITALS — BP 140/88 | HR 74 | Temp 98.6°F | Resp 16 | Ht 74.5 in

## 2019-06-24 DIAGNOSIS — E1151 Type 2 diabetes mellitus with diabetic peripheral angiopathy without gangrene: Secondary | ICD-10-CM

## 2019-06-24 DIAGNOSIS — I10 Essential (primary) hypertension: Secondary | ICD-10-CM

## 2019-06-24 DIAGNOSIS — E785 Hyperlipidemia, unspecified: Secondary | ICD-10-CM

## 2019-06-24 DIAGNOSIS — E1165 Type 2 diabetes mellitus with hyperglycemia: Secondary | ICD-10-CM

## 2019-06-24 DIAGNOSIS — IMO0002 Reserved for concepts with insufficient information to code with codable children: Secondary | ICD-10-CM

## 2019-06-24 DIAGNOSIS — I2510 Atherosclerotic heart disease of native coronary artery without angina pectoris: Secondary | ICD-10-CM

## 2019-06-24 DIAGNOSIS — B029 Zoster without complications: Secondary | ICD-10-CM | POA: Insufficient documentation

## 2019-06-24 LAB — CBC WITH DIFFERENTIAL/PLATELET
Basophils Absolute: 0 10*3/uL (ref 0.0–0.1)
Basophils Relative: 1.2 % (ref 0.0–3.0)
Eosinophils Absolute: 0 10*3/uL (ref 0.0–0.7)
Eosinophils Relative: 1.4 % (ref 0.0–5.0)
HCT: 36.2 % — ABNORMAL LOW (ref 39.0–52.0)
Hemoglobin: 11.7 g/dL — ABNORMAL LOW (ref 13.0–17.0)
Lymphocytes Relative: 24.6 % (ref 12.0–46.0)
Lymphs Abs: 0.7 10*3/uL (ref 0.7–4.0)
MCHC: 32.4 g/dL (ref 30.0–36.0)
MCV: 80 fl (ref 78.0–100.0)
Monocytes Absolute: 0.7 10*3/uL (ref 0.1–1.0)
Monocytes Relative: 24.8 % — ABNORMAL HIGH (ref 3.0–12.0)
Neutro Abs: 1.3 10*3/uL — ABNORMAL LOW (ref 1.4–7.7)
Neutrophils Relative %: 48 % (ref 43.0–77.0)
Platelets: 172 10*3/uL (ref 150.0–400.0)
RBC: 4.52 Mil/uL (ref 4.22–5.81)
RDW: 15.5 % (ref 11.5–15.5)
WBC: 2.6 10*3/uL — ABNORMAL LOW (ref 4.0–10.5)

## 2019-06-24 LAB — COMPREHENSIVE METABOLIC PANEL
ALT: 18 U/L (ref 0–53)
AST: 17 U/L (ref 0–37)
Albumin: 4.1 g/dL (ref 3.5–5.2)
Alkaline Phosphatase: 63 U/L (ref 39–117)
BUN: 13 mg/dL (ref 6–23)
CO2: 30 mEq/L (ref 19–32)
Calcium: 10 mg/dL (ref 8.4–10.5)
Chloride: 100 mEq/L (ref 96–112)
Creatinine, Ser: 1.58 mg/dL — ABNORMAL HIGH (ref 0.40–1.50)
GFR: 53.57 mL/min — ABNORMAL LOW (ref >60.00)
Glucose, Bld: 113 mg/dL — ABNORMAL HIGH (ref 70–99)
Potassium: 3.8 mEq/L (ref 3.5–5.1)
Sodium: 137 mEq/L (ref 135–145)
Total Bilirubin: 0.8 mg/dL (ref 0.2–1.2)
Total Protein: 7 g/dL (ref 6.0–8.3)

## 2019-06-24 LAB — HEMOGLOBIN A1C: Hgb A1c MFr Bld: 6.8 % — ABNORMAL HIGH (ref 4.6–6.5)

## 2019-06-24 LAB — LIPID PANEL
Cholesterol: 121 mg/dL (ref 0–200)
HDL: 39.4 mg/dL (ref >39.00)
LDL Cholesterol: 59 mg/dL (ref 0–99)
NonHDL: 81.7
Total CHOL/HDL Ratio: 3
Triglycerides: 113 mg/dL (ref 0.0–149.0)
VLDL: 22.6 mg/dL (ref 0.0–40.0)

## 2019-06-24 LAB — TSH: TSH: 0.75 u[IU]/mL (ref 0.35–4.50)

## 2019-06-24 MED ORDER — VALACYCLOVIR HCL 1 G PO TABS: 1000.0000 mg | ORAL_TABLET | Freq: Three times a day (TID) | ORAL | 0 refills | Status: DC

## 2019-06-24 MED ORDER — TRIAMCINOLONE ACETONIDE 0.1 % EX CREA: 1.0000 "application " | TOPICAL_CREAM | Freq: Two times a day (BID) | CUTANEOUS | 0 refills | Status: DC

## 2019-06-24 MED ORDER — VALACYCLOVIR HCL 1 G PO TABS: 1000.0000 mg | ORAL_TABLET | Freq: Three times a day (TID) | ORAL | 0 refills | Status: AC

## 2019-06-24 NOTE — Assessment & Plan Note (Signed)
No chest pain or other concerning symptoms Continue current medications CBC, CMP

## 2019-06-24 NOTE — Assessment & Plan Note (Signed)
Check lipid panel, TSH, CMP Continue Crestor at current dose LDL goal less than 70

## 2019-06-24 NOTE — Assessment & Plan Note (Addendum)
BP Readings from Last 3 Encounters:  06/24/19 140/88  12/13/18 134/76  08/29/18 135/82   Blood pressure slightly elevated here today, but blood pressure appears to be fairly controlled-could be slightly better We will continue current medication at current dose CMP, CBC

## 2019-06-24 NOTE — Assessment & Plan Note (Signed)
Check A1c Continue metformin-we will adjust medication if sugars are not controlled Encouraged regular exercise and compliance with diabetic diet Follow-up in 6 months

## 2019-06-24 NOTE — Assessment & Plan Note (Signed)
Rash that he has is consistent with shingles We will start Valtrex 1000 mg 3 times daily for 1 week Triamcinolone cream topically Advised not to break blisters or scratch blisters  Reviewed shingles discussed that this is contagious and can give chickenpox only Advised him to call if he develops pain

## 2019-06-24 NOTE — Patient Instructions (Addendum)
.   Tests ordered today. Your results will be released to [place] (or called to you) after review.  If any changes need to be made, you will be notified at that same time.   Medications reviewed and updated.  Changes include :   vatrex three times a day for shingles.  Cream for shingles rash  Your prescription(s) have been submitted to your pharmacy. Please take as directed and co ntact our office if you believe you are having problem(s) with the medication(s).    Please followup in 6 months

## 2019-07-01 ENCOUNTER — Telehealth: Payer: Self-pay | Admitting: *Deleted

## 2019-07-01 NOTE — Telephone Encounter (Signed)
Unable to leave a message for the pt for Covid 19 Prescreen.

## 2019-07-03 ENCOUNTER — Ambulatory Visit (INDEPENDENT_AMBULATORY_CARE_PROVIDER_SITE_OTHER): Payer: Self-pay | Admitting: *Deleted

## 2019-07-03 ENCOUNTER — Other Ambulatory Visit: Payer: Self-pay

## 2019-07-03 DIAGNOSIS — Z8673 Personal history of transient ischemic attack (TIA), and cerebral infarction without residual deficits: Secondary | ICD-10-CM

## 2019-07-03 DIAGNOSIS — Z5181 Encounter for therapeutic drug level monitoring: Secondary | ICD-10-CM

## 2019-07-03 DIAGNOSIS — G459 Transient cerebral ischemic attack, unspecified: Secondary | ICD-10-CM

## 2019-07-03 LAB — POCT INR: INR: 2 (ref 2.0–3.0)

## 2019-07-03 NOTE — Patient Instructions (Signed)
Description   Today take 1.5 tablets then continue to take 1 tablet daily except for 1/2 a tablet on Monday, Wednesday and Friday.  Recheck INR in 3 weeks. Call the Coumadin clinic with any questions [phone].

## 2019-07-18 ENCOUNTER — Other Ambulatory Visit: Payer: Self-pay | Admitting: Internal Medicine

## 2019-07-18 NOTE — Telephone Encounter (Signed)
Prescription refill request received for Coumadin. Pt is overdue for an office visit with Dr. Lovena Le. Informed pt that to keep refilling his coumadin pt would need to have a yearly follow up with a cardiologist. Transferred pt to [facility] to schedule an appointment. Pt stated he is out of coumadin. Will refill prescription with a 1 month supply.

## 2019-07-22 ENCOUNTER — Ambulatory Visit: Payer: Self-pay | Admitting: Student

## 2019-07-24 ENCOUNTER — Other Ambulatory Visit: Payer: Self-pay

## 2019-07-24 ENCOUNTER — Ambulatory Visit (INDEPENDENT_AMBULATORY_CARE_PROVIDER_SITE_OTHER): Payer: Self-pay | Admitting: *Deleted

## 2019-07-24 DIAGNOSIS — G459 Transient cerebral ischemic attack, unspecified: Secondary | ICD-10-CM

## 2019-07-24 DIAGNOSIS — Z8673 Personal history of transient ischemic attack (TIA), and cerebral infarction without residual deficits: Secondary | ICD-10-CM

## 2019-07-24 DIAGNOSIS — Z5181 Encounter for therapeutic drug level monitoring: Secondary | ICD-10-CM

## 2019-07-24 LAB — POCT INR: INR: 1.7 — AB (ref 2.0–3.0)

## 2019-07-24 NOTE — Patient Instructions (Signed)
Description   Today take 1.5 tablets then start taking 1 tablet daily except for 1/2 a tablet on Mondays and Fridays.  Recheck INR in 2-3 weeks. Call the Coumadin clinic with any questions [phone].

## 2019-07-28 ENCOUNTER — Other Ambulatory Visit: Payer: Self-pay | Admitting: Internal Medicine

## 2019-07-31 NOTE — Progress Notes (Addendum)
Cardiology Office Note Date:  08/01/2019  Patient ID:  Karl [name], Karl [name] 1956/10/16, MRN [id number] PCP:  Binnie Rail, MD  Cardiologist:  Dr. Lovena Le    Chief Complaint:  annual visit  History of Present Illness: Karl [name] is a 63 y.o. male with history of DM, HTN, chronic CHF (systolioc), longstanding DCM  (patient recommended though refused ICD), CKD (III), and stroke felt to be embolic and started on warfarin, , Unfortunately Aug 2019 had recurrent stroke, TTE noted thrombus in apex his presenting INR 1.6 bridged with heparin to a therapeutic INR.  He comes in today to be seen for Dr. Lovena Le, last seen by him march 2019, at that time cardiac-wise doing OK, starting warfarin after his stroke.     He c/w some LLE weakness since his last stroke, comes today with his daughter using wheelchair, though at home, short trips will use a walker or cane for stability.  He has lost some weight in the last year, his appetite not what it used to be.  He denies any CP, palpitations or cardiac awareness of any kind.  No SOB at rest, no DOE at his level of activity.  No symptoms of PND or orthopnea.  He denies any dissy spells, no near syncope or syncope  He denies bleeding or signs of bleeding  Past Medical History:  Diagnosis Date  . Acute CVA (cerebrovascular accident) ([place]) 06/14/2018  . Coronary artery disease    blockage   Stent Dr. Lovena Le 15 years 1998  . Dyspnea   . Hypertension   . Stroke [facility]) 2019   denies residual on 06/14/2018  . Type II diabetes mellitus ([place]) 07/2014 dx    Past Surgical History:  Procedure Laterality Date  . CORONARY ANGIOPLASTY WITH STENT PLACEMENT  1998  . FRACTURE SURGERY    . GREEN LIGHT LASER TURP (TRANSURETHRAL RESECTION OF PROSTATE N/A 06/05/2018   Procedure: GREEN LIGHT LASER TURP , TRANSURETHRAL RESECTION OF PROSTATE;  Surgeon: [name], MD;  Location: [facility];  Service: Urology;  Laterality: N/A;  . TIBIA FRACTURE SURGERY Left 1980s     Current Outpatient Medications  Medication Sig Dispense Refill  . acetaminophen (TYLENOL) 325 MG tablet Take 1-2 tablets (325-650 mg total) by mouth every 4 (four) hours as needed for mild pain.    . blood glucose meter kit and supplies KIT Dispense based on patient and insurance preference. Use up to four times daily as directed. (FOR E11.9). 1 each 0  . carvedilol (COREG) 12.5 MG tablet TAKE 1 TABLET BY MOUTH TWICE DAILY WITH MEALS 180 tablet 1  . Lancets 30G MISC Use to check blood sugars daily 100 each 3  . metFORMIN (GLUCOPHAGE) 1000 MG tablet TAKE 1 TABLET BY MOUTH TWICE DAILY WITH A MEAL 180 tablet 0  . polyethylene glycol (MIRALAX / GLYCOLAX) packet Take 17 g by mouth 2 (two) times daily. 60 each 0  . potassium chloride (K-DUR) 10 MEQ tablet Take 1 tablet by mouth twice daily. 180 tablet 1  . rosuvastatin (CRESTOR) 40 MG tablet TAKE 1 TABLET BY MOUTH ONCE DAILY AT  6PM 90 tablet 1  . triamcinolone cream (KENALOG) 0.1 % Apply 1 application topically 2 (two) times daily. 30 g 0  . warfarin (COUMADIN) 5 MG tablet TAKE AS DIRECTED BY  COUMADIN  CLINIC 30 tablet 0   No current facility-administered medications for this visit.     Allergies:   Lipitor [atorvastatin]   Social History:  The patient  reports that he quit smoking about 18 years ago. His smoking use included cigarettes. He has a 10.00 pack-year smoking history. He has never used smokeless tobacco. He reports current alcohol use. He reports previous drug use.   Family History:  The patient's family history includes Clotting disorder (age of onset: 71) in his sister; Diabetes in his brother, father, maternal grandmother, mother, sister, and another family member; Stroke (age of onset: 79) in his father.  ROS:  Please see the history of present illness.  All other systems are reviewed and otherwise negative.   PHYSICAL EXAM:  VS:  BP 120/70   Pulse 70   Ht 6' 2.5" (1.892 m)   Wt 148 lb (67.1 kg)   BMI 18.75 kg/m  BMI: Body  mass index is 18.75 kg/m. Well nourished, well developed, in no acute distress  HEENT: normocephalic, atraumatic  Neck: no JVD, carotid bruits or masses Cardiac:  RRR; soft SM, no rubs, or gallops Lungs:  CTA b/l, no wheezing, rhonchi or rales  Abd: soft, nontender MS: no deformity, advanced atrophy for age Ext: no edema  Skin: warm and dry, no rash Neuro:  No gross deficits appreciated Psych: euthymic mood, full affect    EKG:  Done today and reviewed by myself shows SR 74, RBBB, LAD, no significant change compared to last    06/15/18: TTE Study Conclusions - Left ventricle: The cavity size was moderately dilated. Wall   thickness was normal. The estimated ejection fraction was 20%.   Diffuse hypokinesis. There is akinesis of the anteroseptal and   anterior myocardium. Indeterminate diastolic function. Definity   contrast shows swirling contrast and loosely organized thrombotic   material at the LV apex. - Aortic valve: Mildly calcified annulus. Trileaflet. - Mitral valve: There was mild regurgitation. - Right atrium: Central venous pressure (est): 3 mm Hg. - Atrial septum: No defect or patent foramen ovale was identified. - Tricuspid valve: There was trivial regurgitation. - Pulmonary arteries: Systolic pressure could not be accurately   estimated. - Pericardium, extracardiac: There was no pericardial effusion.      Recent Labs: 06/24/2019: ALT 18; BUN 13; Creatinine, Ser 1.58; Hemoglobin 11.7; Platelets 172.0; Potassium 3.8; Sodium 137; TSH 0.75  06/24/2019: Cholesterol 121; HDL 39.40; LDL Cholesterol 59; Total CHOL/HDL Ratio 3; Triglycerides 113.0; VLDL 22.6   CrCl cannot be calculated (Patient's most recent lab result is older than the maximum 21 days allowed.).   Wt Readings from Last 3 Encounters:  08/01/19 148 lb (67.1 kg)  07/22/18 179 lb (81.2 kg)  07/15/18 196 lb (88.9 kg)     Other studies reviewed: Additional studies/records reviewed today include:  summarized above  ASSESSMENT AND PLAN:  1. NICM 2. Chronic CHF (systolic)    He has no symptoms or exam findings of fluid OL    He is off his ACE it looks like stopped at time of discharge from rehab last year    I asked about ICD today, he quickly declined again    No palpitations, no syncope   I will d/w Dr. Lovena Le, perhaps get him back on ACE/ARB  2. HTN     Looks good   3. Hx of LBBB looking EKG, for the last couple years has a RBBB     No brady symptoms no syncope     No changes for now, I will reach out to Dr. Lovena Le for his thoughts/recommendations  4. H/o embolic strokes and LV thrombus  INR today 1.8 up slightly from last week, further up-titrated via coumadin clinic today     He eats a fair amount of greens, had collard greens last night.  Discussed dietary impact on his INR     He is advised not to stop his greens but will reduce amount/frequency some his dark greens   Disposition: F/u with Dr. Lovena Le in 73mo sooner if needed, pending discussion as noted above.  ADDEND: Dr. TLovena Ledoes not feel there is pacing indication given no symptoms though given he has developed an alternat BBB to avoid betablocker (nodal blocking agents).  I will ask my MA to call him and have him stop his carvedilol come in for a BMET and get him back on ACE or ARB.    Current medicines are reviewed at length with the patient today.  The patient did not have any concerns regarding medicines.  SVenetia Night PA-C 08/01/2019 10:34 AM     [facility] [address] [phone](office)  [phone](fax)

## 2019-08-01 ENCOUNTER — Ambulatory Visit (INDEPENDENT_AMBULATORY_CARE_PROVIDER_SITE_OTHER): Payer: Self-pay

## 2019-08-01 ENCOUNTER — Ambulatory Visit (INDEPENDENT_AMBULATORY_CARE_PROVIDER_SITE_OTHER): Payer: Self-pay | Admitting: Physician Assistant

## 2019-08-01 ENCOUNTER — Encounter: Payer: Self-pay | Admitting: Physician Assistant

## 2019-08-01 ENCOUNTER — Other Ambulatory Visit: Payer: Self-pay

## 2019-08-01 VITALS — BP 120/70 | HR 70 | Ht 74.5 in | Wt 148.0 lb

## 2019-08-01 DIAGNOSIS — I10 Essential (primary) hypertension: Secondary | ICD-10-CM

## 2019-08-01 DIAGNOSIS — Z5181 Encounter for therapeutic drug level monitoring: Secondary | ICD-10-CM

## 2019-08-01 DIAGNOSIS — I513 Intracardiac thrombosis, not elsewhere classified: Secondary | ICD-10-CM

## 2019-08-01 DIAGNOSIS — I428 Other cardiomyopathies: Secondary | ICD-10-CM

## 2019-08-01 DIAGNOSIS — Z8673 Personal history of transient ischemic attack (TIA), and cerebral infarction without residual deficits: Secondary | ICD-10-CM

## 2019-08-01 DIAGNOSIS — G459 Transient cerebral ischemic attack, unspecified: Secondary | ICD-10-CM

## 2019-08-01 DIAGNOSIS — I4510 Unspecified right bundle-branch block: Secondary | ICD-10-CM

## 2019-08-01 DIAGNOSIS — I5022 Chronic systolic (congestive) heart failure: Secondary | ICD-10-CM

## 2019-08-01 DIAGNOSIS — I240 Acute coronary thrombosis not resulting in myocardial infarction: Secondary | ICD-10-CM

## 2019-08-01 LAB — POCT INR: INR: 1.8 — AB (ref 2.0–3.0)

## 2019-08-01 NOTE — Patient Instructions (Signed)
Medication Instructions:  Your physician recommends that you continue on your current medications as directed. Please refer to the Current Medication list given to you today.  If you need a refill on your cardiac medications before your next appointment, please call your pharmacy.   Lab work: NONE ORDERED  TODAY   If you have labs (blood work) drawn today and your tests are completely normal, you will receive your results only by: Marland Kitchen MyChart Message (if you have MyChart) OR . A paper copy in the mail If you have any lab test that is abnormal or we need to change your treatment, we will call you to review the results.  Testing/Procedures: NONE ORDERED  TODAY    Follow-Up: At [facility], you and your health needs are our priority.  As part of our continuing mission to provide you with exceptional heart care, we have created designated Provider Care Teams.  These Care Teams include your primary Cardiologist (physician) and Advanced Practice Providers (APPs -  Physician Assistants and Nurse Practitioners) who all work together to provide you with the care you need, when you need it. You will need a follow up appointment in:  6 months.  Please call our office 2 months in advance to schedule this appointment.  You may see Cristopher Peru, MD Any Other Special Instructions Will Be Listed Below (If Applicable).

## 2019-08-01 NOTE — Patient Instructions (Addendum)
  Description   Today take 1.5 tablets then start taking 1 tablet daily except for 1/2 a tablet on Mondays.  Recheck INR in 10days. Call the Coumadin clinic with any questions [phone].

## 2019-08-12 ENCOUNTER — Ambulatory Visit (INDEPENDENT_AMBULATORY_CARE_PROVIDER_SITE_OTHER): Payer: Self-pay | Admitting: *Deleted

## 2019-08-12 ENCOUNTER — Other Ambulatory Visit: Payer: Self-pay

## 2019-08-12 DIAGNOSIS — Z8673 Personal history of transient ischemic attack (TIA), and cerebral infarction without residual deficits: Secondary | ICD-10-CM

## 2019-08-12 DIAGNOSIS — G459 Transient cerebral ischemic attack, unspecified: Secondary | ICD-10-CM

## 2019-08-12 DIAGNOSIS — Z5181 Encounter for therapeutic drug level monitoring: Secondary | ICD-10-CM

## 2019-08-12 LAB — POCT INR: INR: 2 (ref 2.0–3.0)

## 2019-08-12 NOTE — Patient Instructions (Signed)
Description   Continue taking 1 tablet daily except for 1/2 a tablet on Mondays.  Recheck INR in 2.5weeks. Call the Coumadin clinic with any questions [phone].

## 2019-08-15 ENCOUNTER — Telehealth: Payer: Self-pay | Admitting: *Deleted

## 2019-08-15 NOTE — Telephone Encounter (Signed)
Attempted to contact patient multiple times no answer and voice mail not set up still

## 2019-08-21 ENCOUNTER — Other Ambulatory Visit: Payer: Self-pay | Admitting: Internal Medicine

## 2019-08-28 ENCOUNTER — Other Ambulatory Visit: Payer: Self-pay

## 2019-08-28 ENCOUNTER — Ambulatory Visit (INDEPENDENT_AMBULATORY_CARE_PROVIDER_SITE_OTHER): Payer: Self-pay

## 2019-08-28 DIAGNOSIS — Z8673 Personal history of transient ischemic attack (TIA), and cerebral infarction without residual deficits: Secondary | ICD-10-CM

## 2019-08-28 DIAGNOSIS — G459 Transient cerebral ischemic attack, unspecified: Secondary | ICD-10-CM

## 2019-08-28 DIAGNOSIS — Z5181 Encounter for therapeutic drug level monitoring: Secondary | ICD-10-CM

## 2019-08-28 LAB — POCT INR: INR: 1.6 — AB (ref 2.0–3.0)

## 2019-08-28 NOTE — Patient Instructions (Signed)
Description   Take 1.5 tablets today, then start taking 1 tablet daily.  Recheck INR in 2 weeks. Call the Coumadin clinic with any questions [phone].

## 2019-09-11 ENCOUNTER — Ambulatory Visit (INDEPENDENT_AMBULATORY_CARE_PROVIDER_SITE_OTHER): Payer: Self-pay

## 2019-09-11 ENCOUNTER — Other Ambulatory Visit: Payer: Self-pay

## 2019-09-11 DIAGNOSIS — Z8673 Personal history of transient ischemic attack (TIA), and cerebral infarction without residual deficits: Secondary | ICD-10-CM

## 2019-09-11 DIAGNOSIS — G459 Transient cerebral ischemic attack, unspecified: Secondary | ICD-10-CM

## 2019-09-11 DIAGNOSIS — Z5181 Encounter for therapeutic drug level monitoring: Secondary | ICD-10-CM

## 2019-09-11 LAB — POCT INR: INR: 2.5 (ref 2.0–3.0)

## 2019-09-11 NOTE — Patient Instructions (Signed)
Description   Continue on same dosage 1 tablet daily.  Recheck INR in 3 weeks. Call the Coumadin clinic with any questions [phone].

## 2019-09-16 ENCOUNTER — Other Ambulatory Visit: Payer: Self-pay | Admitting: Internal Medicine

## 2019-10-02 ENCOUNTER — Other Ambulatory Visit: Payer: Self-pay

## 2019-10-02 ENCOUNTER — Ambulatory Visit (INDEPENDENT_AMBULATORY_CARE_PROVIDER_SITE_OTHER): Payer: Self-pay | Admitting: *Deleted

## 2019-10-02 DIAGNOSIS — Z8673 Personal history of transient ischemic attack (TIA), and cerebral infarction without residual deficits: Secondary | ICD-10-CM

## 2019-10-02 DIAGNOSIS — Z5181 Encounter for therapeutic drug level monitoring: Secondary | ICD-10-CM

## 2019-10-02 DIAGNOSIS — G459 Transient cerebral ischemic attack, unspecified: Secondary | ICD-10-CM

## 2019-10-02 LAB — POCT INR: INR: 2.1 (ref 2.0–3.0)

## 2019-10-02 NOTE — Patient Instructions (Signed)
Description   Continue on same dosage 1 tablet daily.  Recheck INR in 4 weeks. Call the Coumadin clinic with any questions [phone].

## 2019-10-28 ENCOUNTER — Other Ambulatory Visit: Payer: Self-pay

## 2019-10-28 ENCOUNTER — Ambulatory Visit (INDEPENDENT_AMBULATORY_CARE_PROVIDER_SITE_OTHER): Payer: Medicare Other | Admitting: *Deleted

## 2019-10-28 DIAGNOSIS — Z5181 Encounter for therapeutic drug level monitoring: Secondary | ICD-10-CM | POA: Diagnosis not present

## 2019-10-28 DIAGNOSIS — G459 Transient cerebral ischemic attack, unspecified: Secondary | ICD-10-CM

## 2019-10-28 DIAGNOSIS — Z8673 Personal history of transient ischemic attack (TIA), and cerebral infarction without residual deficits: Secondary | ICD-10-CM | POA: Diagnosis not present

## 2019-10-28 LAB — POCT INR: INR: 2.1 (ref 2.0–3.0)

## 2019-10-28 NOTE — Patient Instructions (Signed)
Description   Continue on same dosage 1 tablet daily.  Recheck INR in 5 weeks. Call the Coumadin clinic with any questions #[phone].    

## 2019-11-25 ENCOUNTER — Telehealth: Payer: Self-pay | Admitting: Internal Medicine

## 2019-11-25 ENCOUNTER — Other Ambulatory Visit: Payer: Self-pay | Admitting: Internal Medicine

## 2019-11-25 NOTE — Telephone Encounter (Signed)
New Message   *STAT* If patient is at the pharmacy, call can be transferred to refill team.   1. Which medications need to be refilled? (please list name of each medication and dose if known) warfarin (COUMADIN) 5 MG tablet  2. Which pharmacy/location (including street and city if local pharmacy) is medication to be sent to?[place], [address]  3. Do they need a 30 day or 90 day supply? 30 day

## 2019-11-25 NOTE — Telephone Encounter (Signed)
This was already sent in an hour ago.

## 2019-12-02 ENCOUNTER — Ambulatory Visit (INDEPENDENT_AMBULATORY_CARE_PROVIDER_SITE_OTHER): Payer: Medicare Other | Admitting: *Deleted

## 2019-12-02 ENCOUNTER — Other Ambulatory Visit: Payer: Self-pay

## 2019-12-02 DIAGNOSIS — Z8673 Personal history of transient ischemic attack (TIA), and cerebral infarction without residual deficits: Secondary | ICD-10-CM

## 2019-12-02 DIAGNOSIS — Z5181 Encounter for therapeutic drug level monitoring: Secondary | ICD-10-CM

## 2019-12-02 DIAGNOSIS — G459 Transient cerebral ischemic attack, unspecified: Secondary | ICD-10-CM | POA: Diagnosis not present

## 2019-12-02 LAB — POCT INR: INR: 3 (ref 2.0–3.0)

## 2019-12-02 NOTE — Patient Instructions (Signed)
Description   Continue on same dosage 1 tablet daily.  Recheck INR in 6 weeks. Call the Coumadin clinic with any questions #[phone].     

## 2019-12-24 DIAGNOSIS — I69998 Other sequelae following unspecified cerebrovascular disease: Secondary | ICD-10-CM | POA: Insufficient documentation

## 2019-12-24 DIAGNOSIS — R531 Weakness: Secondary | ICD-10-CM | POA: Insufficient documentation

## 2019-12-24 NOTE — Progress Notes (Signed)
Subjective:    Patient ID: Karl [name], male    DOB: 1955/12/30, 64 y.o.   MRN: [id number]  HPI The patient is here for follow up of their chronic medical problems, including CAD, CHF, hypertension, diabetes, hyperlipidemia, h/o CVA with left sided weakness.  He is here with a family member.  He is taking all of his medications as prescribed.    He is exercising regularly - exercise bike 10 min twice daily.      He has no concerns.  He states his left-sided weakness is the same and he does have some difficulty walking.  He does use a walker.  He feels he is eating fairly good overall, but when pushed further he states he does eat more cookies than he should.   Medications and allergies reviewed with patient and updated if appropriate.  Patient Active Problem List   Diagnosis Date Noted  . 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 Shingles 06/24/2019  . Left wrist pain 12/13/2018  . 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 Chronic anticoagulation   . Acute ischemic right PCA stroke ([place]) 06/18/2018  . Urinary retention 06/05/2018  . Encounter for therapeutic drug monitoring 02/08/2018  . Coronary artery disease 12/19/2017  . TIA (transient ischemic attack) 12/19/2017  . Hip flexor tendinitis 05/22/2017  . Pain of both hip joints 04/25/2017  . Dyslipidemia 09/01/2015  . BPH without urinary obstruction 09/01/2015  . DM (diabetes mellitus), type 2, uncontrolled, periph vascular complic ([place]) 32/20/2542  . Essential hypertension 04/20/2009  . Cardiomyopathy, ischemic 04/20/2009  . LEFT BUNDLE BRANCH BLOCK 04/20/2009  . Chronic systolic heart failure ([place]) 04/20/2009    Current Outpatient Medications on File Prior to Visit  Medication Sig Dispense Refill  . acetaminophen (TYLENOL) 325 MG tablet Take 1-2 tablets (325-650 mg total) by mouth every 4 (four) hours as needed for mild pain.    . blood glucose meter kit and supplies KIT Dispense based on patient  and insurance preference. Use up to four times daily as directed. (FOR E11.9). 1 each 0  . carvedilol (COREG) 12.5 MG tablet TAKE 1 TABLET BY MOUTH TWICE DAILY WITH MEALS 180 tablet 1  . Lancets 30G MISC Use to check blood sugars daily 100 each 3  . metFORMIN (GLUCOPHAGE) 1000 MG tablet TAKE 1 TABLET BY MOUTH TWICE DAILY WITH A MEAL 180 tablet 1  . polyethylene glycol (MIRALAX / GLYCOLAX) packet Take 17 g by mouth 2 (two) times daily. 60 each 0  . potassium chloride (K-DUR) 10 MEQ tablet Take 1 tablet by mouth twice daily. 180 tablet 1  . rosuvastatin (CRESTOR) 40 MG tablet TAKE 1 TABLET BY MOUTH ONCE DAILY AT  6PM 90 tablet 1  . triamcinolone cream (KENALOG) 0.1 % Apply 1 application topically 2 (two) times daily. 30 g 0  . warfarin (COUMADIN) 5 MG tablet USE AS DIRECTED BY  COUMADIN  CLINIC 95 tablet 0   No current facility-administered medications on file prior to visit.    Past Medical History:  Diagnosis Date  . Acute CVA (cerebrovascular accident) ([place]) 06/14/2018  . Coronary artery disease    blockage   Stent Dr. Lovena Le 15 years 1998  . Dyspnea   . Hypertension   . Stroke [facility]) 2019   denies residual on 06/14/2018  . Type II diabetes mellitus ([place]) 07/2014 dx    Past Surgical History:  Procedure Laterality Date  . CORONARY ANGIOPLASTY WITH STENT PLACEMENT  1998  .  FRACTURE SURGERY    . GREEN LIGHT LASER TURP (TRANSURETHRAL RESECTION OF PROSTATE N/A 06/05/2018   Procedure: GREEN LIGHT LASER TURP , TRANSURETHRAL RESECTION OF PROSTATE;  Surgeon: [name], MD;  Location: [facility];  Service: Urology;  Laterality: N/A;  . TIBIA FRACTURE SURGERY Left 1980s    Social History   Socioeconomic History  . Marital status: Married    Spouse name: Not on file  . Number of children: Not on file  . Years of education: Not on file  . Highest education level: Not on file  Occupational History  . Not on file  Tobacco Use  . Smoking status: Former Smoker    Packs/day: 2.00     Years: 5.00    Pack years: 10.00    Types: Cigarettes    Quit date: 12/04/2000    Years since quitting: 19.0  . Smokeless tobacco: Never Used  Substance and Sexual Activity  . Alcohol use: Yes    Comment: 06/14/2018 "nothing in 2019"  . Drug use: Not Currently  . Sexual activity: Not on file  Other Topics Concern  . Not on file  Social History Narrative   Former smoker - quit 1998 after stent -    Lives with dtr and 2 g-sons   Separated from wife in 2005, good terms   Employed with [employer] - walks at lot at work   No regular exercise   Social Determinants of Radio broadcast assistant Strain:   . Difficulty of Paying Living Expenses: Not on file  Food Insecurity:   . Worried About Charity fundraiser in the Last Year: Not on file  . Ran Out of Food in the Last Year: Not on file  Transportation Needs:   . Lack of Transportation (Medical): Not on file  . Lack of Transportation (Non-Medical): Not on file  Physical Activity:   . Days of Exercise per Week: Not on file  . Minutes of Exercise per Session: Not on file  Stress:   . Feeling of Stress : Not on file  Social Connections:   . Frequency of Communication with Friends and Family: Not on file  . Frequency of Social Gatherings with Friends and Family: Not on file  . Attends Religious Services: Not on file  . Active Member of Clubs or Organizations: Not on file  . Attends Archivist Meetings: Not on file  . Marital Status: Not on file    Family History  Problem Relation Age of Onset  . Diabetes Mother   . Diabetes Father   . Stroke Father 77  . Diabetes Sister   . Clotting disorder Sister 30  . Diabetes Other        nephew  . Diabetes Brother   . Diabetes Maternal Grandmother     Review of Systems  Constitutional: Negative for chills and fever.  Respiratory: Negative for cough, shortness of breath and wheezing.   Cardiovascular: Negative for chest pain, palpitations and leg  swelling.  Neurological: Positive for weakness (left sided from prior CVA). Negative for dizziness, light-headedness and headaches.       Objective:   Vitals:   12/25/19 1101  BP: (!) 142/70  Pulse: 75  Resp: 16  Temp: 98.3 F (36.8 C)  SpO2: 99%   BP Readings from Last 3 Encounters:  12/25/19 (!) 142/70  08/01/19 120/70  06/24/19 140/88   Wt Readings from Last 3 Encounters:  12/25/19 156 lb (70.8 kg)  08/01/19 148 lb (67.1 kg)  07/22/18 179 lb (81.2 kg)   Body mass index is 19.76 kg/m.   Physical Exam    Constitutional: Appears well-developed and well-nourished. No distress.  HENT:  Head: Normocephalic and atraumatic.  Neck: Neck supple. No tracheal deviation present. No thyromegaly present.  No cervical lymphadenopathy Cardiovascular: Normal rate, regular rhythm and normal heart sounds.  No murmur heard. No carotid bruit .  No edema Pulmonary/Chest: Effort normal and breath sounds normal. No respiratory distress. No has no wheezes. No rales.  Skin: Skin is warm and dry. Not diaphoretic.  Psychiatric: Normal mood and affect. Behavior is normal.      Assessment & Plan:    See Problem List for Assessment and Plan of chronic medical problems.    This visit occurred during the SARS-CoV-2 public health emergency.  Safety protocols were in place, including screening questions prior to the visit, additional usage of staff PPE, and extensive cleaning of exam room while observing appropriate contact time as indicated for disinfecting solutions.

## 2019-12-24 NOTE — Patient Instructions (Addendum)
  Blood work was ordered.     Medications reviewed and updated.  Changes include :   Start losartan 25 mg daily  Your prescription(s) have been submitted to your pharmacy. Please take as directed and contact our office if you believe you are having problem(s) with the medication(s).   Please followup in 6 months    COVID-19 Vaccine Information can be found at:  [email or web address] For questions related to vaccine distribution or appointments, please email [email or web address] or call [phone].

## 2019-12-25 ENCOUNTER — Ambulatory Visit (INDEPENDENT_AMBULATORY_CARE_PROVIDER_SITE_OTHER): Payer: Medicare Other | Admitting: Internal Medicine

## 2019-12-25 ENCOUNTER — Encounter: Payer: Self-pay | Admitting: Internal Medicine

## 2019-12-25 ENCOUNTER — Other Ambulatory Visit: Payer: Self-pay

## 2019-12-25 VITALS — BP 142/70 | HR 75 | Temp 98.3°F | Resp 16 | Ht 74.5 in | Wt 156.0 lb

## 2019-12-25 DIAGNOSIS — E1151 Type 2 diabetes mellitus with diabetic peripheral angiopathy without gangrene: Secondary | ICD-10-CM

## 2019-12-25 DIAGNOSIS — E785 Hyperlipidemia, unspecified: Secondary | ICD-10-CM

## 2019-12-25 DIAGNOSIS — R531 Weakness: Secondary | ICD-10-CM

## 2019-12-25 DIAGNOSIS — I2510 Atherosclerotic heart disease of native coronary artery without angina pectoris: Secondary | ICD-10-CM | POA: Diagnosis not present

## 2019-12-25 DIAGNOSIS — IMO0002 Reserved for concepts with insufficient information to code with codable children: Secondary | ICD-10-CM

## 2019-12-25 DIAGNOSIS — I69998 Other sequelae following unspecified cerebrovascular disease: Secondary | ICD-10-CM

## 2019-12-25 DIAGNOSIS — I10 Essential (primary) hypertension: Secondary | ICD-10-CM | POA: Diagnosis not present

## 2019-12-25 DIAGNOSIS — I5022 Chronic systolic (congestive) heart failure: Secondary | ICD-10-CM | POA: Diagnosis not present

## 2019-12-25 DIAGNOSIS — E1165 Type 2 diabetes mellitus with hyperglycemia: Secondary | ICD-10-CM

## 2019-12-25 LAB — CBC WITH DIFFERENTIAL/PLATELET
Basophils Absolute: 0 10*3/uL (ref 0.0–0.1)
Basophils Relative: 0.8 % (ref 0.0–3.0)
Eosinophils Absolute: 0 10*3/uL (ref 0.0–0.7)
Eosinophils Relative: 0.9 % (ref 0.0–5.0)
HCT: 36.7 % — ABNORMAL LOW (ref 39.0–52.0)
Hemoglobin: 11.9 g/dL — ABNORMAL LOW (ref 13.0–17.0)
Lymphocytes Relative: 20.5 % (ref 12.0–46.0)
Lymphs Abs: 0.9 10*3/uL (ref 0.7–4.0)
MCHC: 32.5 g/dL (ref 30.0–36.0)
MCV: 82.2 fl (ref 78.0–100.0)
Monocytes Absolute: 0.4 10*3/uL (ref 0.1–1.0)
Monocytes Relative: 9.1 % (ref 3.0–12.0)
Neutro Abs: 2.9 10*3/uL (ref 1.4–7.7)
Neutrophils Relative %: 68.7 % (ref 43.0–77.0)
Platelets: 177 10*3/uL (ref 150.0–400.0)
RBC: 4.46 Mil/uL (ref 4.22–5.81)
RDW: 13.9 % (ref 11.5–15.5)
WBC: 4.2 10*3/uL (ref 4.0–10.5)

## 2019-12-25 LAB — COMPREHENSIVE METABOLIC PANEL
ALT: 38 U/L (ref 0–53)
AST: 31 U/L (ref 0–37)
Albumin: 4.1 g/dL (ref 3.5–5.2)
Alkaline Phosphatase: 79 U/L (ref 39–117)
BUN: 14 mg/dL (ref 6–23)
CO2: 32 mEq/L (ref 19–32)
Calcium: 10.5 mg/dL (ref 8.4–10.5)
Chloride: 103 mEq/L (ref 96–112)
Creatinine, Ser: 2.15 mg/dL — ABNORMAL HIGH (ref 0.40–1.50)
GFR: 37.48 mL/min — ABNORMAL LOW (ref >60.00)
Glucose, Bld: 205 mg/dL — ABNORMAL HIGH (ref 70–99)
Potassium: 4.6 mEq/L (ref 3.5–5.1)
Sodium: 138 mEq/L (ref 135–145)
Total Bilirubin: 0.6 mg/dL (ref 0.2–1.2)
Total Protein: 7.1 g/dL (ref 6.0–8.3)

## 2019-12-25 LAB — MICROALBUMIN / CREATININE URINE RATIO
Creatinine,U: 109.7 mg/dL
Microalb Creat Ratio: 25.2 mg/g (ref 0.0–30.0)
Microalb, Ur: 27.6 mg/dL — ABNORMAL HIGH (ref 0.0–1.9)

## 2019-12-25 LAB — LIPID PANEL
Cholesterol: 104 mg/dL (ref 0–200)
HDL: 48.5 mg/dL (ref >39.00)
LDL Cholesterol: 37 mg/dL (ref 0–99)
NonHDL: 55.58
Total CHOL/HDL Ratio: 2
Triglycerides: 91 mg/dL (ref 0.0–149.0)
VLDL: 18.2 mg/dL (ref 0.0–40.0)

## 2019-12-25 LAB — HEMOGLOBIN A1C: Hgb A1c MFr Bld: 7.1 % — ABNORMAL HIGH (ref 4.6–6.5)

## 2019-12-25 MED ORDER — LOSARTAN POTASSIUM 25 MG PO TABS: 25.0000 mg | ORAL_TABLET | Freq: Every day | ORAL | 1 refills | Status: DC

## 2019-12-25 NOTE — Assessment & Plan Note (Signed)
Chronic Check lipid panel  Continue daily statin Regular exercise and healthy diet encouraged  

## 2019-12-25 NOTE — Assessment & Plan Note (Signed)
Chronic No chest pain, SOB Following with cardiology Continue current meds Cmp, cbc, lipids

## 2019-12-25 NOTE — Assessment & Plan Note (Signed)
Chronic Blood pressure slightly higher than ideal, especially given his history Continue carvedilol 12.5 mg twice daily Start losartan 25 mg daily, which he also needs for his diabetes CMP

## 2019-12-25 NOTE — Assessment & Plan Note (Signed)
Chronic Euvolemic Continue current medications CMP

## 2019-12-25 NOTE — Assessment & Plan Note (Signed)
Chronic Stable Continue warfarin, statin Losartan added for better BP control Cmp, cbc, lipids

## 2019-12-25 NOTE — Assessment & Plan Note (Addendum)
Chronic Was controlled at his last visit Has not been as compliant with a diabetic diet as he should-stressed the importance of compliance with a diabetic Check A1c, urine microalbumin Continue regular exercise Start losartan 25 mg daily

## 2019-12-26 ENCOUNTER — Other Ambulatory Visit: Payer: Self-pay

## 2019-12-26 ENCOUNTER — Telehealth: Payer: Self-pay

## 2019-12-26 DIAGNOSIS — N289 Disorder of kidney and ureter, unspecified: Secondary | ICD-10-CM

## 2019-12-26 NOTE — Telephone Encounter (Signed)
Pt aware of results 

## 2019-12-26 NOTE — Telephone Encounter (Signed)
New message    The patient calling for test results.   

## 2020-01-08 ENCOUNTER — Other Ambulatory Visit: Payer: Medicare Other

## 2020-01-30 ENCOUNTER — Other Ambulatory Visit: Payer: Self-pay

## 2020-01-30 ENCOUNTER — Ambulatory Visit (INDEPENDENT_AMBULATORY_CARE_PROVIDER_SITE_OTHER): Payer: Medicare Other | Admitting: Internal Medicine

## 2020-01-30 ENCOUNTER — Encounter: Payer: Self-pay | Admitting: Internal Medicine

## 2020-01-30 VITALS — BP 118/68 | HR 63 | Ht 74.5 in | Wt 154.0 lb

## 2020-01-30 DIAGNOSIS — I447 Left bundle-branch block, unspecified: Secondary | ICD-10-CM

## 2020-01-30 DIAGNOSIS — I255 Ischemic cardiomyopathy: Secondary | ICD-10-CM

## 2020-01-30 MED ORDER — ROSUVASTATIN CALCIUM 20 MG PO TABS: 20.0000 mg | ORAL_TABLET | Freq: Every day | ORAL | 3 refills | Status: DC

## 2020-01-30 NOTE — Patient Instructions (Signed)
Medication Instructions:  Your physician has recommended you make the following change in your medication:  1.  Reduce your Crestor 40 mg---Reduce your Crestor to 20 mg one tablet by mouth daily.   Labwork: None ordered.  Testing/Procedures: None ordered.  Follow-Up: Your physician wants you to follow-up in: one year with Dr. Ladona Ridgel.  You will receive a reminder letter in the mail two months in advance. If you don't receive a letter, please call our office to schedule the follow-up appointment.  Any Other Special Instructions Will Be Listed Below (If Applicable).  If you need a refill on your cardiac medications before your next appointment, please call your pharmacy.

## 2020-01-30 NOTE — Progress Notes (Signed)
HPI [name] returns today for followup. He is a pleasant 64 yo man with a longstanding non-ischemic CM, chronic class 2-3 CHF, HTN, PAF, and RBBB. He has been stable. He has not had syncope. He notes a 100 lb weight loss in the last 7 years. He sustained a stroke several years ago. He denies chest pain or sob. No edema.  Allergies  Allergen Reactions  . Lipitor [Atorvastatin] Nausea And Vomiting     Current Outpatient Medications  Medication Sig Dispense Refill  . acetaminophen (TYLENOL) 325 MG tablet Take 1-2 tablets (325-650 mg total) by mouth every 4 (four) hours as needed for mild pain.    . blood glucose meter kit and supplies KIT Dispense based on patient and insurance preference. Use up to four times daily as directed. (FOR E11.9). 1 each 0  . carvedilol (COREG) 12.5 MG tablet TAKE 1 TABLET BY MOUTH TWICE DAILY WITH MEALS 180 tablet 1  . Lancets 30G MISC Use to check blood sugars daily 100 each 3  . losartan (COZAAR) 25 MG tablet Take 1 tablet (25 mg total) by mouth daily. 90 tablet 1  . metFORMIN (GLUCOPHAGE) 1000 MG tablet TAKE 1 TABLET BY MOUTH TWICE DAILY WITH A MEAL 180 tablet 1  . polyethylene glycol (MIRALAX / GLYCOLAX) packet Take 17 g by mouth 2 (two) times daily. 60 each 0  . potassium chloride (K-DUR) 10 MEQ tablet Take 1 tablet by mouth twice daily. 180 tablet 1  . warfarin (COUMADIN) 5 MG tablet USE AS DIRECTED BY  COUMADIN  CLINIC 95 tablet 0  . rosuvastatin (CRESTOR) 20 MG tablet Take 1 tablet (20 mg total) by mouth daily. 90 tablet 3   No current facility-administered medications for this visit.     Past Medical History:  Diagnosis Date  . Acute CVA (cerebrovascular accident) ([place]) 06/14/2018  . Coronary artery disease    blockage   Stent Dr. Lovena Le 15 years 1998  . Dyspnea   . Hypertension   . Stroke [facility]) 2019   denies residual on 06/14/2018  . Type II diabetes mellitus ([place]) 07/2014 dx    ROS:   All systems reviewed and negative except as  noted in the HPI.   Past Surgical History:  Procedure Laterality Date  . CORONARY ANGIOPLASTY WITH STENT PLACEMENT  1998  . FRACTURE SURGERY    . GREEN LIGHT LASER TURP (TRANSURETHRAL RESECTION OF PROSTATE N/A 06/05/2018   Procedure: GREEN LIGHT LASER TURP , TRANSURETHRAL RESECTION OF PROSTATE;  Surgeon: [name], MD;  Location: [facility];  Service: Urology;  Laterality: N/A;  . TIBIA FRACTURE SURGERY Left 1980s     Family History  Problem Relation Age of Onset  . Diabetes Mother   . Diabetes Father   . Stroke Father 13  . Diabetes Sister   . Clotting disorder Sister 108  . Diabetes Other        nephew  . Diabetes Brother   . Diabetes Maternal Grandmother      Social History   Socioeconomic History  . Marital status: Married    Spouse name: Not on file  . Number of children: Not on file  . Years of education: Not on file  . Highest education level: Not on file  Occupational History  . Not on file  Tobacco Use  . Smoking status: Former Smoker    Packs/day: 2.00    Years: 5.00    Pack years: 10.00    Types:  Cigarettes    Quit date: 12/04/2000    Years since quitting: 19.1  . Smokeless tobacco: Never Used  Substance and Sexual Activity  . Alcohol use: Yes    Comment: 06/14/2018 "nothing in 2019"  . Drug use: Not Currently  . Sexual activity: Not on file  Other Topics Concern  . Not on file  Social History Narrative   Former smoker - quit 1998 after stent -    Lives with dtr and 2 g-sons   Separated from wife in 2005, good terms   Employed with [employer] - walks at lot at work   No regular exercise   Social Determinants of Radio broadcast assistant Strain:   . Difficulty of Paying Living Expenses: Not on file  Food Insecurity:   . Worried About Charity fundraiser in the Last Year: Not on file  . Ran Out of Food in the Last Year: Not on file  Transportation Needs:   . Lack of Transportation (Medical): Not on file  . Lack of  Transportation (Non-Medical): Not on file  Physical Activity:   . Days of Exercise per Week: Not on file  . Minutes of Exercise per Session: Not on file  Stress:   . Feeling of Stress : Not on file  Social Connections:   . Frequency of Communication with Friends and Family: Not on file  . Frequency of Social Gatherings with Friends and Family: Not on file  . Attends Religious Services: Not on file  . Active Member of Clubs or Organizations: Not on file  . Attends Archivist Meetings: Not on file  . Marital Status: Not on file  Intimate Partner Violence:   . Fear of Current or Ex-Partner: Not on file  . Emotionally Abused: Not on file  . Physically Abused: Not on file  . Sexually Abused: Not on file     BP 118/68   Pulse 63   Ht 6' 2.5" (1.892 m)   Wt 154 lb (69.9 kg)   SpO2 98%   BMI 19.51 kg/m   Physical Exam:  Well appearing NAD HEENT: Unremarkable Neck:  No JVD, no thyromegally Lymphatics:  No adenopathy Back:  No CVA tenderness Lungs:  Clear with no wheezes HEART:  Regular rate rhythm, no murmurs, no rubs, no clicks Abd:  soft, positive bowel sounds, no organomegally, no rebound, no guarding Ext:  2 plus pulses, no edema, no cyanosis, no clubbing Skin:  No rashes no nodules Neuro:  CN II through XII intact, motor grossly intact  EKG - nsr with RBBB  DEVICE  Normal device function.  See PaceArt for details.   Assess/Plan: 1. Chronic systolic heart failure - his symptoms are class 2 but he is quite sedentary. He denies edema. 2. Stroke - he sustained a stroke about 2 years ago. He will continue warfarin.  3. HTN - his bp is normal now, with his 100 lbs weight loss. We will follow. 4. Obesity - this has resolved.  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  Mikle [email or web address].D.

## 2020-02-08 ENCOUNTER — Other Ambulatory Visit: Payer: Self-pay | Admitting: Internal Medicine

## 2020-02-16 ENCOUNTER — Other Ambulatory Visit: Payer: Self-pay | Admitting: Internal Medicine

## 2020-02-29 ENCOUNTER — Other Ambulatory Visit: Payer: Self-pay | Admitting: Internal Medicine

## 2020-03-02 ENCOUNTER — Ambulatory Visit (INDEPENDENT_AMBULATORY_CARE_PROVIDER_SITE_OTHER): Payer: Medicare Other | Admitting: *Deleted

## 2020-03-02 ENCOUNTER — Other Ambulatory Visit: Payer: Self-pay

## 2020-03-02 ENCOUNTER — Encounter (INDEPENDENT_AMBULATORY_CARE_PROVIDER_SITE_OTHER): Payer: Self-pay

## 2020-03-02 DIAGNOSIS — G459 Transient cerebral ischemic attack, unspecified: Secondary | ICD-10-CM

## 2020-03-02 DIAGNOSIS — Z8673 Personal history of transient ischemic attack (TIA), and cerebral infarction without residual deficits: Secondary | ICD-10-CM | POA: Diagnosis not present

## 2020-03-02 DIAGNOSIS — Z5181 Encounter for therapeutic drug level monitoring: Secondary | ICD-10-CM | POA: Diagnosis not present

## 2020-03-02 LAB — POCT INR: INR: 1.8 — AB (ref 2.0–3.0)

## 2020-03-02 NOTE — Patient Instructions (Addendum)
Description   Take 1.5 tablets today, and then continue on same dosage 1 tablet daily.  Recheck INR in 4 weeks. Call the Coumadin clinic with any questions #[phone].

## 2020-03-21 ENCOUNTER — Other Ambulatory Visit: Payer: Self-pay | Admitting: Internal Medicine

## 2020-03-30 ENCOUNTER — Ambulatory Visit (INDEPENDENT_AMBULATORY_CARE_PROVIDER_SITE_OTHER): Payer: Medicare Other | Admitting: *Deleted

## 2020-03-30 ENCOUNTER — Other Ambulatory Visit: Payer: Self-pay

## 2020-03-30 DIAGNOSIS — Z5181 Encounter for therapeutic drug level monitoring: Secondary | ICD-10-CM

## 2020-03-30 DIAGNOSIS — Z8673 Personal history of transient ischemic attack (TIA), and cerebral infarction without residual deficits: Secondary | ICD-10-CM

## 2020-03-30 DIAGNOSIS — G459 Transient cerebral ischemic attack, unspecified: Secondary | ICD-10-CM

## 2020-03-30 LAB — POCT INR: INR: 2.4 (ref 2.0–3.0)

## 2020-03-30 NOTE — Patient Instructions (Signed)
Description   Continue on same dosage 1 tablet daily.  Recheck INR in 5 weeks. Call the Coumadin clinic with any questions #[phone].

## 2020-05-02 IMAGING — RF DG SWALLOWING FUNCTION - [facility]
13 of 20 series · 13 of 24 positions shown · non-contrast
Comparison: none

[Series 1: run · 1 of 22 frames shown (1 of 13)]
[frame 3/22]
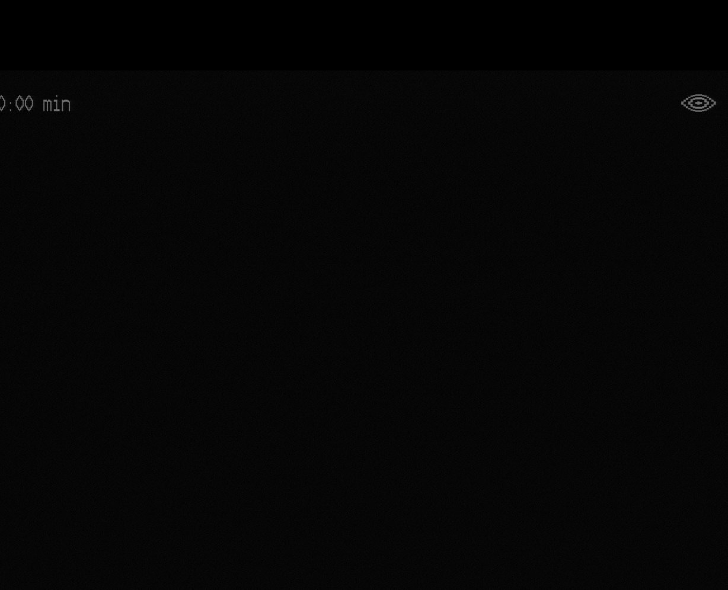

[Series 2: run · 1 of 34 frames shown (2 of 13)]
[frame 30/34]
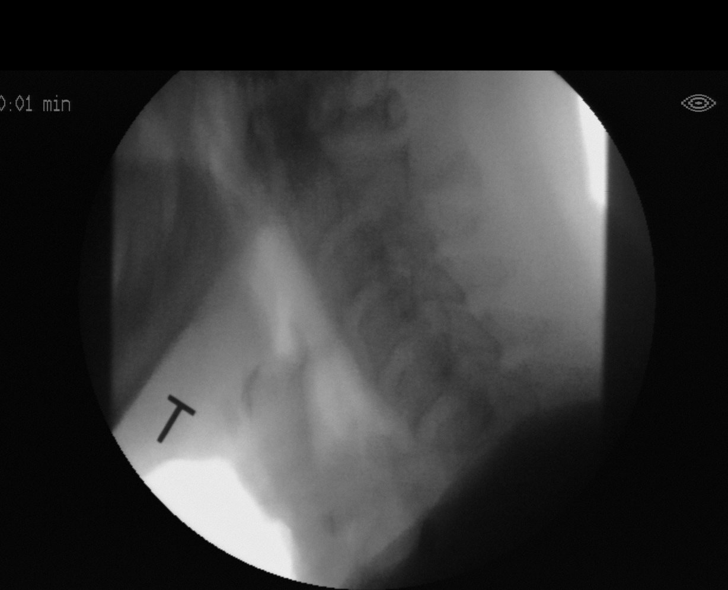

[Series 4: run · 1 of 239 frames shown (3 of 13)]
[frame 204/239]
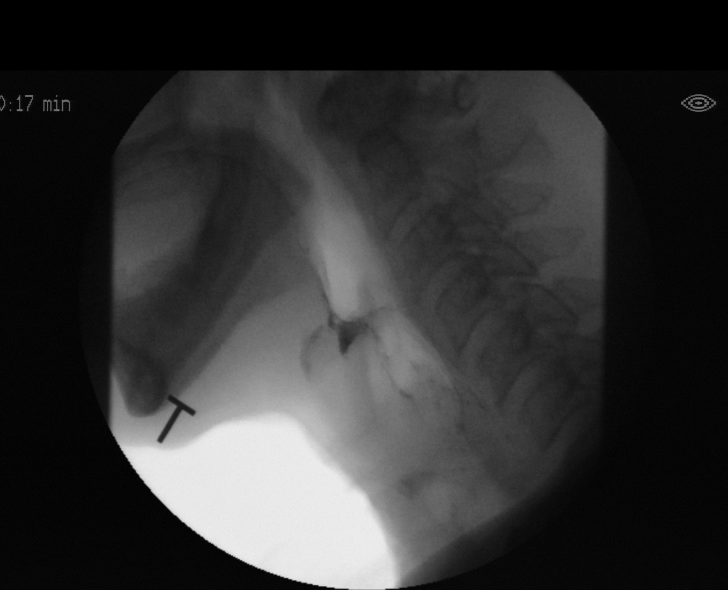

[Series 6: run · 1 of 27 frames shown (4 of 13)]
[frame 14/27]
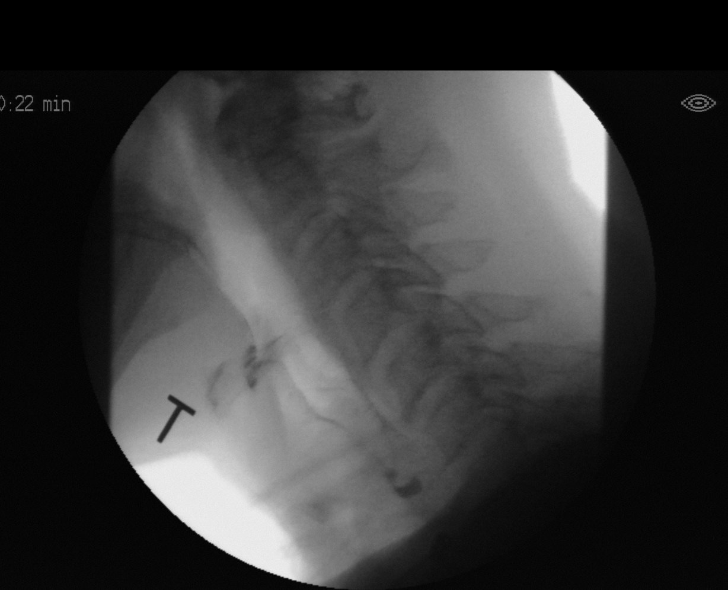

[Series 7: run · 1 of 274 frames shown (5 of 13)]
[frame 233/274]
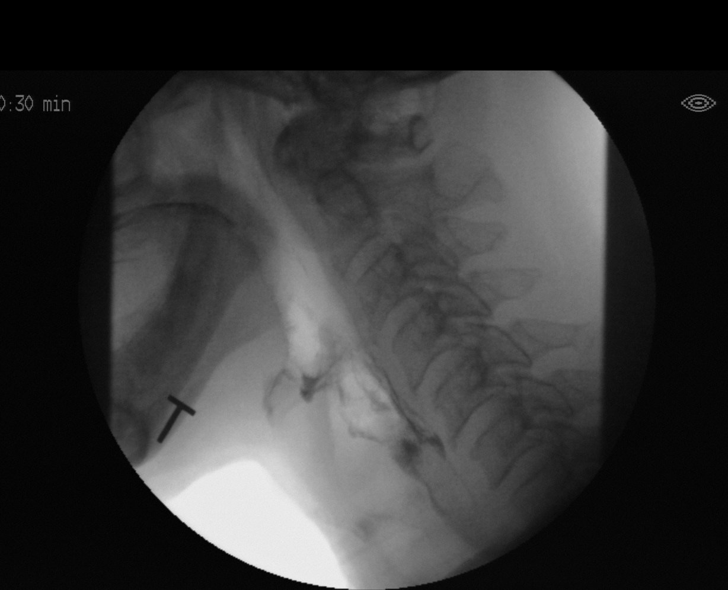

[Series 9: run · 1 of 208 frames shown (6 of 13)]
[frame 105/208]
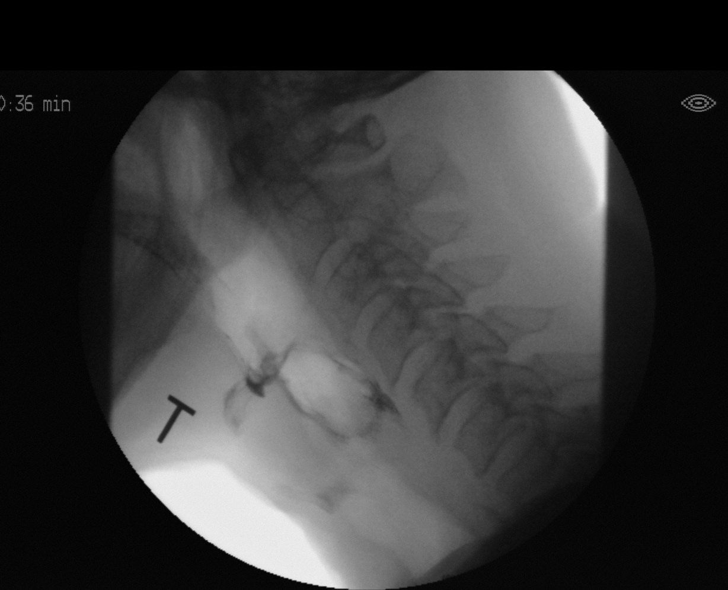

[Series 11: run · 1 of 178 frames shown (7 of 13)]
[frame 90/178]
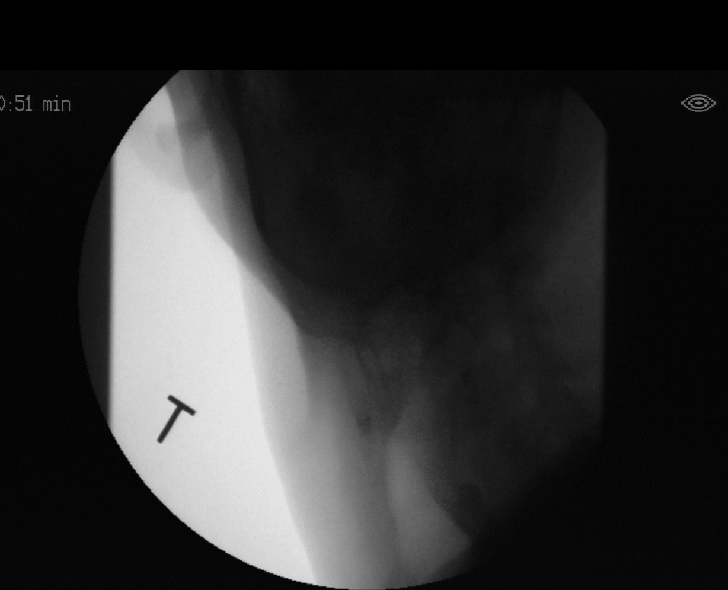

[Series 12: run · 1 of 181 frames shown (8 of 13)]
[frame 28/181]
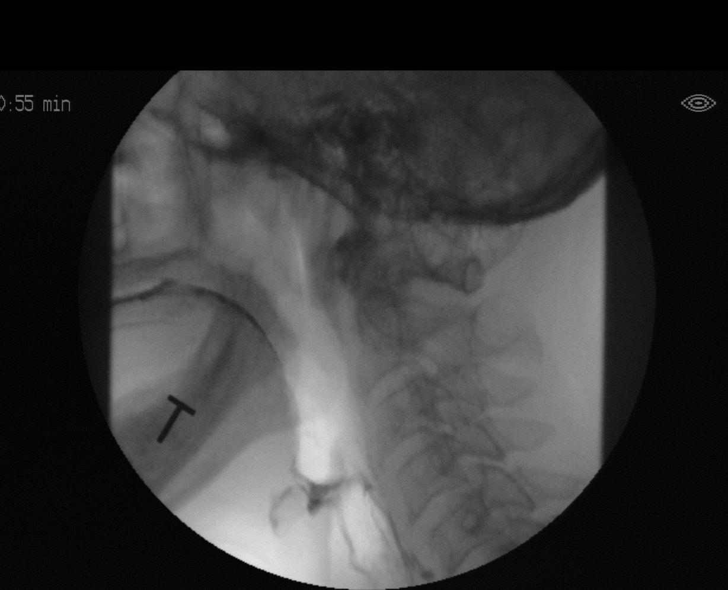

[Series 14: run · 1 of 379 frames shown (9 of 13)]
[frame 57/379]
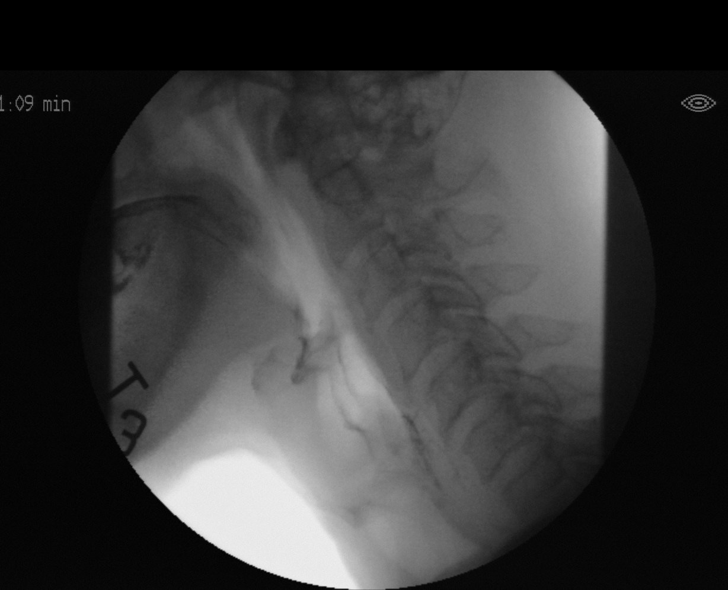

[Series 15: run · 1 of 40 frames shown (10 of 13)]
[frame 21/40]
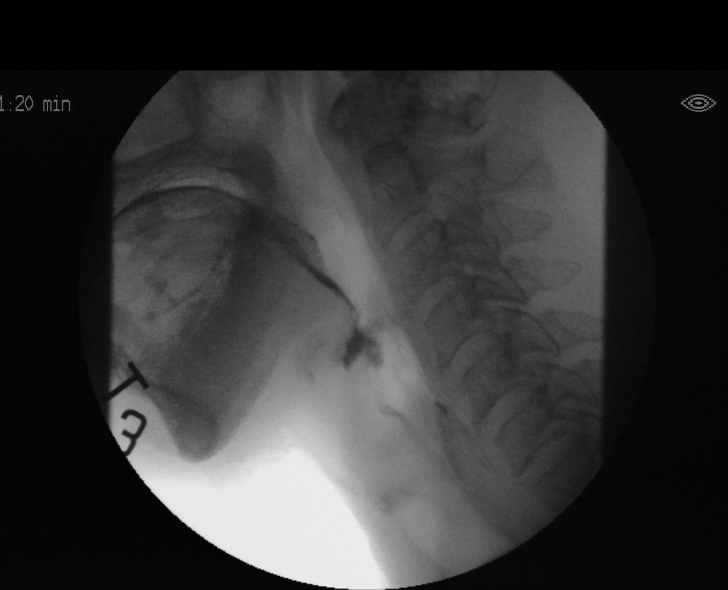

[Series 17: run · 1 of 99 frames shown (11 of 13)]
[frame 50/99]
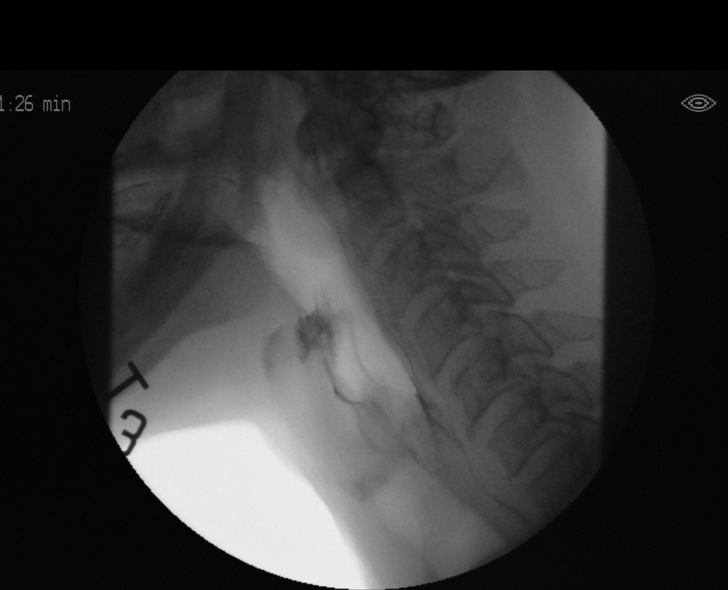

[Series 19: run · 1 of 101 frames shown (12 of 13)]
[frame 1/101]
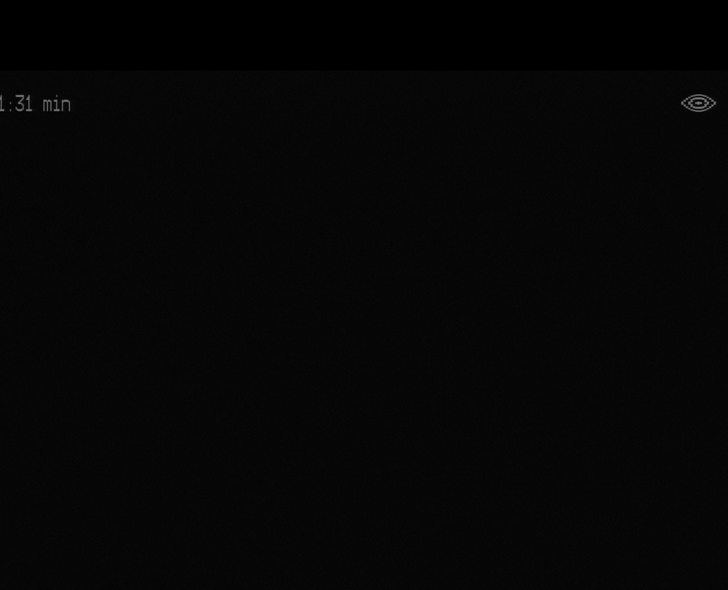

[Series 20: run · 1 of 340 frames shown (13 of 13)]
[frame 290/340]
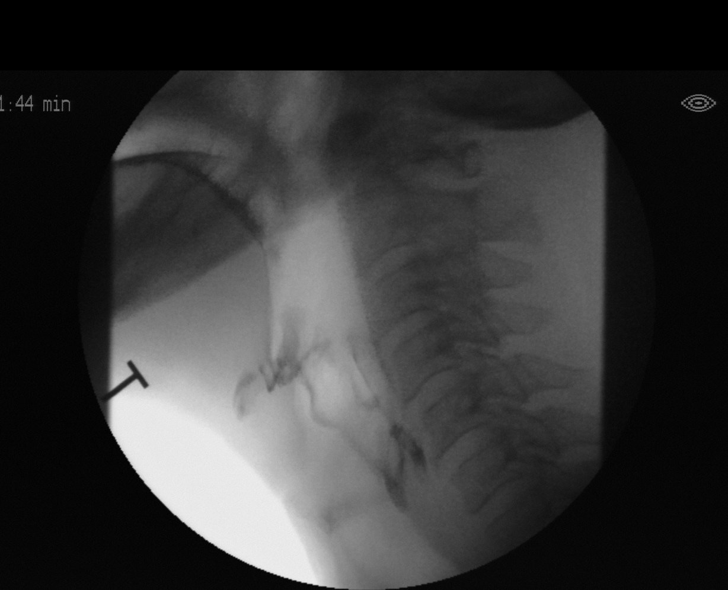

[13 of 24 positions shown; findings below may reference images not displayed]

FLUOROSCOPY FOR SWALLOWING FUNCTION STUDY:
Fluoroscopy was provided for swallowing function study, which was administered by a speech pathologist.  Final results and recommendations from this study are contained within the speech pathology report.

## 2020-05-04 ENCOUNTER — Other Ambulatory Visit: Payer: Self-pay

## 2020-05-04 ENCOUNTER — Ambulatory Visit (INDEPENDENT_AMBULATORY_CARE_PROVIDER_SITE_OTHER): Payer: Medicare Other | Admitting: *Deleted

## 2020-05-04 DIAGNOSIS — Z8673 Personal history of transient ischemic attack (TIA), and cerebral infarction without residual deficits: Secondary | ICD-10-CM

## 2020-05-04 DIAGNOSIS — G459 Transient cerebral ischemic attack, unspecified: Secondary | ICD-10-CM

## 2020-05-04 DIAGNOSIS — Z5181 Encounter for therapeutic drug level monitoring: Secondary | ICD-10-CM | POA: Diagnosis not present

## 2020-05-04 LAB — POCT INR: INR: 2.3 (ref 2.0–3.0)

## 2020-05-04 NOTE — Patient Instructions (Signed)
Description   Continue on same dosage 1 tablet daily.  Recheck INR in 6 weeks. Call the Coumadin clinic with any questions #[phone].

## 2020-05-11 ENCOUNTER — Other Ambulatory Visit: Payer: Self-pay | Admitting: Internal Medicine

## 2020-05-18 ENCOUNTER — Other Ambulatory Visit: Payer: Self-pay | Admitting: Internal Medicine

## 2020-05-18 MED ORDER — WARFARIN SODIUM 5 MG PO TABS: ORAL_TABLET | ORAL | 0 refills | Status: DC

## 2020-05-18 NOTE — Telephone Encounter (Signed)
° ° °*  STAT* If patient is at the pharmacy, call can be transferred to refill team.   1. Which medications need to be refilled? (please list name of each medication and dose if known) warfarin (COUMADIN) 5 MG tablet  2. Which pharmacy/location (including street and city if local pharmacy) is medication to be sent to? [facility] - [name], [place] - [address]  3. Do they need a 30 day or 90 day supply? 30 days  Pt is completely out of medication

## 2020-05-20 ENCOUNTER — Other Ambulatory Visit: Payer: Self-pay

## 2020-05-20 ENCOUNTER — Other Ambulatory Visit: Payer: Self-pay | Admitting: Internal Medicine

## 2020-05-20 MED ORDER — POTASSIUM CHLORIDE CRYS ER 10 MEQ PO TBCR: EXTENDED_RELEASE_TABLET | ORAL | 0 refills | Status: DC

## 2020-06-15 ENCOUNTER — Other Ambulatory Visit: Payer: Self-pay | Admitting: Internal Medicine

## 2020-06-15 ENCOUNTER — Ambulatory Visit (INDEPENDENT_AMBULATORY_CARE_PROVIDER_SITE_OTHER): Payer: Medicare Other | Admitting: *Deleted

## 2020-06-15 ENCOUNTER — Other Ambulatory Visit: Payer: Self-pay

## 2020-06-15 DIAGNOSIS — G459 Transient cerebral ischemic attack, unspecified: Secondary | ICD-10-CM | POA: Diagnosis not present

## 2020-06-15 DIAGNOSIS — Z8673 Personal history of transient ischemic attack (TIA), and cerebral infarction without residual deficits: Secondary | ICD-10-CM | POA: Diagnosis not present

## 2020-06-15 DIAGNOSIS — Z5181 Encounter for therapeutic drug level monitoring: Secondary | ICD-10-CM

## 2020-06-15 LAB — POCT INR: INR: 2 (ref 2.0–3.0)

## 2020-06-15 NOTE — Patient Instructions (Signed)
Description   Today take 1.5 tablets then continue on same dosage 1 tablet daily.  Recheck INR in 6 weeks. Call the Coumadin clinic with any questions #[phone].

## 2020-06-23 ENCOUNTER — Other Ambulatory Visit: Payer: Self-pay | Admitting: Internal Medicine

## 2020-06-23 ENCOUNTER — Ambulatory Visit: Payer: Medicare Other | Admitting: Internal Medicine

## 2020-07-05 NOTE — Progress Notes (Signed)
Subjective:    Patient ID: Karl [name], male    DOB: 1956-09-21, 64 y.o.   MRN: [id number]  HPI The patient is here for follow up of their chronic medical problems, including CAD, CHF, htn, DM, hyperlipidemia, h/o CVA with left sided weakness.  He is taking all of his medications as prescribed.     He is exercising regularly.   He rides his exercise bike.  The left leg shakes when he walks.  It feels like muscle twitches or spasms.  He denies any pain.  His left hand he has to make it do what he wants.  This is the side affected by his stroke.   Medications and allergies reviewed with patient and updated if appropriate.  Patient Active Problem List   Diagnosis Date Noted  . 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 Shingles 06/24/2019  . Left wrist pain 12/13/2018  . 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 Chronic anticoagulation   . Acute ischemic right PCA stroke ([place]) 06/18/2018  . Urinary retention 06/05/2018  . Encounter for therapeutic drug monitoring 02/08/2018  . Coronary artery disease 12/19/2017  . TIA (transient ischemic attack) 12/19/2017  . Hip flexor tendinitis 05/22/2017  . Pain of both hip joints 04/25/2017  . Dyslipidemia 09/01/2015  . BPH without urinary obstruction 09/01/2015  . DM (diabetes mellitus), type 2, uncontrolled, periph vascular complic ([place]) 78/29/5621  . Essential hypertension 04/20/2009  . Cardiomyopathy, ischemic 04/20/2009  . Left bundle branch block 04/20/2009  . Chronic systolic heart failure ([place]) 04/20/2009    Current Outpatient Medications on File Prior to Visit  Medication Sig Dispense Refill  . acetaminophen (TYLENOL) 325 MG tablet Take 1-2 tablets (325-650 mg total) by mouth every 4 (four) hours as needed for mild pain.    . blood glucose meter kit and supplies KIT Dispense based on patient and insurance preference. Use up to four times daily as directed. (FOR E11.9). 1 each 0  . carvedilol (COREG) 12.5 MG  tablet TAKE 1 TABLET BY MOUTH TWICE DAILY WITH MEALS 180 tablet 1  . Lancets 30G MISC Use to check blood sugars daily 100 each 3  . losartan (COZAAR) 25 MG tablet Take 1 tablet by mouth once daily 90 tablet 1  . metFORMIN (GLUCOPHAGE) 1000 MG tablet TAKE 1 TABLET BY MOUTH TWICE DAILY WITH A MEAL 180 tablet 1  . polyethylene glycol (MIRALAX / GLYCOLAX) packet Take 17 g by mouth 2 (two) times daily. 60 each 0  . potassium chloride (KLOR-CON) 10 MEQ tablet Take 1 tablet by mouth twice daily 180 tablet 0  . warfarin (COUMADIN) 5 MG tablet TAKE 1 TABLET DAILY OR AS DIRECTED BY COUMADIN CLINIC 35 tablet 0  . rosuvastatin (CRESTOR) 20 MG tablet Take 1 tablet (20 mg total) by mouth daily. 90 tablet 3   No current facility-administered medications on file prior to visit.    Past Medical History:  Diagnosis Date  . Acute CVA (cerebrovascular accident) ([place]) 06/14/2018  . Coronary artery disease    blockage   Stent Dr. Lovena Le 15 years 1998  . Dyspnea   . Hypertension   . Stroke [facility]) 2019   denies residual on 06/14/2018  . Type II diabetes mellitus ([place]) 07/2014 dx    Past Surgical History:  Procedure Laterality Date  . CORONARY ANGIOPLASTY WITH STENT PLACEMENT  1998  . FRACTURE SURGERY    . GREEN LIGHT LASER TURP (TRANSURETHRAL RESECTION OF PROSTATE N/A 06/05/2018  Procedure: GREEN LIGHT LASER TURP , TRANSURETHRAL RESECTION OF PROSTATE;  Surgeon: [name], MD;  Location: [facility];  Service: Urology;  Laterality: N/A;  . TIBIA FRACTURE SURGERY Left 1980s    Social History   Socioeconomic History  . Marital status: Married    Spouse name: Not on file  . Number of children: Not on file  . Years of education: Not on file  . Highest education level: Not on file  Occupational History  . Not on file  Tobacco Use  . Smoking status: Former Smoker    Packs/day: 2.00    Years: 5.00    Pack years: 10.00    Types: Cigarettes    Quit date: 12/04/2000    Years since quitting: 19.6  .  Smokeless tobacco: Never Used  Vaping Use  . Vaping Use: Never used  Substance and Sexual Activity  . Alcohol use: Yes    Comment: 06/14/2018 "nothing in 2019"  . Drug use: Not Currently  . Sexual activity: Not on file  Other Topics Concern  . Not on file  Social History Narrative   Former smoker - quit 1998 after stent -    Lives with dtr and 2 g-sons   Separated from wife in 2005, good terms   Employed with [employer] - walks at lot at work   No regular exercise   Social Determinants of Radio broadcast assistant Strain:   . Difficulty of Paying Living Expenses:   Food Insecurity:   . Worried About Charity fundraiser in the Last Year:   . Arboriculturist in the Last Year:   Transportation Needs:   . Film/video editor (Medical):   Marland Kitchen Lack of Transportation (Non-Medical):   Physical Activity:   . Days of Exercise per Week:   . Minutes of Exercise per Session:   Stress:   . Feeling of Stress :   Social Connections:   . Frequency of Communication with Friends and Family:   . Frequency of Social Gatherings with Friends and Family:   . Attends Religious Services:   . Active Member of Clubs or Organizations:   . Attends Archivist Meetings:   Marland Kitchen Marital Status:     Family History  Problem Relation Age of Onset  . Diabetes Mother   . Diabetes Father   . Stroke Father 68  . Diabetes Sister   . Clotting disorder Sister 75  . Diabetes Other        nephew  . Diabetes Brother   . Diabetes Maternal Grandmother     Review of Systems  Constitutional: Negative for chills and fever.  Respiratory: Negative for cough, shortness of breath and wheezing.   Cardiovascular: Negative for chest pain, palpitations and leg swelling.  Neurological: Positive for weakness (LUE, LLE). Negative for dizziness, light-headedness and headaches.  Hematological: Does not bruise/bleed easily.       Objective:   Vitals:   07/06/20 0944  BP: 126/70    Pulse: 64  Temp: 98.2 F (36.8 C)  SpO2: 98%   BP Readings from Last 3 Encounters:  07/06/20 126/70  01/30/20 118/68  12/25/19 (!) 142/70   Wt Readings from Last 3 Encounters:  07/06/20 167 lb (75.8 kg)  01/30/20 154 lb (69.9 kg)  12/25/19 156 lb (70.8 kg)   Body mass index is 21.15 kg/m.   Physical Exam    Constitutional: Appears well-developed and well-nourished. No distress.  HENT:  Head: Normocephalic and atraumatic.  Neck: Neck supple. No tracheal deviation present. No thyromegaly present.  No cervical lymphadenopathy Cardiovascular: Normal rate, regular rhythm and normal heart sounds.   No murmur heard. No carotid bruit .  No edema Pulmonary/Chest: Effort normal and breath sounds normal. No respiratory distress. No has no wheezes. No rales.  Skin: Skin is warm and dry. Not diaphoretic.  Psychiatric: Normal mood and affect. Behavior is normal.      Assessment & Plan:    See Problem List for Assessment and Plan of chronic medical problems.    This visit occurred during the SARS-CoV-2 public health emergency.  Safety protocols were in place, including screening questions prior to the visit, additional usage of staff PPE, and extensive cleaning of exam room while observing appropriate contact time as indicated for disinfecting solutions.

## 2020-07-05 NOTE — Patient Instructions (Addendum)
  Blood work was ordered.     Medications reviewed and updated.  Changes include :   Try baclofen 2-3 times a day for left leg muscle spasms.   Your prescription(s) have been submitted to your pharmacy. Please take as directed and contact our office if you believe you are having problem(s) with the medication(s).  A referral was ordered for  GI for a colonoscopy.      Someone from their office will call you to schedule an appointment.    Please followup in 6 months

## 2020-07-06 ENCOUNTER — Encounter: Payer: Self-pay | Admitting: Internal Medicine

## 2020-07-06 ENCOUNTER — Ambulatory Visit (INDEPENDENT_AMBULATORY_CARE_PROVIDER_SITE_OTHER): Payer: Medicare Other | Admitting: Internal Medicine

## 2020-07-06 ENCOUNTER — Other Ambulatory Visit: Payer: Self-pay

## 2020-07-06 VITALS — BP 126/70 | HR 64 | Temp 98.2°F | Ht 74.5 in | Wt 167.0 lb

## 2020-07-06 DIAGNOSIS — E785 Hyperlipidemia, unspecified: Secondary | ICD-10-CM

## 2020-07-06 DIAGNOSIS — I2510 Atherosclerotic heart disease of native coronary artery without angina pectoris: Secondary | ICD-10-CM | POA: Diagnosis not present

## 2020-07-06 DIAGNOSIS — Z125 Encounter for screening for malignant neoplasm of prostate: Secondary | ICD-10-CM

## 2020-07-06 DIAGNOSIS — Z1211 Encounter for screening for malignant neoplasm of colon: Secondary | ICD-10-CM

## 2020-07-06 DIAGNOSIS — I255 Ischemic cardiomyopathy: Secondary | ICD-10-CM

## 2020-07-06 DIAGNOSIS — E1151 Type 2 diabetes mellitus with diabetic peripheral angiopathy without gangrene: Secondary | ICD-10-CM | POA: Diagnosis not present

## 2020-07-06 DIAGNOSIS — I10 Essential (primary) hypertension: Secondary | ICD-10-CM

## 2020-07-06 DIAGNOSIS — I69998 Other sequelae following unspecified cerebrovascular disease: Secondary | ICD-10-CM | POA: Diagnosis not present

## 2020-07-06 DIAGNOSIS — I5022 Chronic systolic (congestive) heart failure: Secondary | ICD-10-CM

## 2020-07-06 DIAGNOSIS — R531 Weakness: Secondary | ICD-10-CM

## 2020-07-06 DIAGNOSIS — E1165 Type 2 diabetes mellitus with hyperglycemia: Secondary | ICD-10-CM

## 2020-07-06 DIAGNOSIS — IMO0002 Reserved for concepts with insufficient information to code with codable children: Secondary | ICD-10-CM

## 2020-07-06 DIAGNOSIS — G8110 Spastic hemiplegia affecting unspecified side: Secondary | ICD-10-CM | POA: Insufficient documentation

## 2020-07-06 LAB — PSA, MEDICARE: PSA: 6.63 ng/ml — ABNORMAL HIGH (ref 0.10–4.00)

## 2020-07-06 MED ORDER — BACLOFEN 10 MG PO TABS: 10.0000 mg | ORAL_TABLET | Freq: Three times a day (TID) | ORAL | 5 refills | Status: DC | PRN

## 2020-07-06 NOTE — Assessment & Plan Note (Signed)
Chronic Continue Metformin Check A1c Continue regular exercise and diabetic diet

## 2020-07-06 NOTE — Assessment & Plan Note (Signed)
Chronic Check lipid panel  Continue daily statin Regular exercise and healthy diet encouraged  

## 2020-07-06 NOTE — Assessment & Plan Note (Signed)
Some Chronic Spasticity, which is new Weakness is stable He is exercising regularly Blood pressure well controlled Continue statin, warfarin Check lipids, CBC, CMP

## 2020-07-06 NOTE — Assessment & Plan Note (Signed)
Chronic BP well controlled Current regimen effective and well tolerated Continue current medications at current doses cmp  

## 2020-07-06 NOTE — Assessment & Plan Note (Signed)
Chronic No concerning symptoms suggestive of angina Continue current medications 

## 2020-07-06 NOTE — Assessment & Plan Note (Signed)
Chronic Euvolemic on exam Continue current medications 

## 2020-07-06 NOTE — Assessment & Plan Note (Signed)
Acute Left side-primarily leg Secondary to previous stroke This does affect his walking Trial of baclofen 10-20 mg 3 times daily

## 2020-07-07 ENCOUNTER — Other Ambulatory Visit: Payer: Self-pay | Admitting: Internal Medicine

## 2020-07-07 DIAGNOSIS — R972 Elevated prostate specific antigen [PSA]: Secondary | ICD-10-CM

## 2020-07-07 LAB — COMPLETE METABOLIC PANEL WITH GFR
AG Ratio: 1.5 (calc) (ref 1.0–2.5)
ALT: 11 U/L (ref 9–46)
AST: 17 U/L (ref 10–35)
Albumin: 4.1 g/dL (ref 3.6–5.1)
Alkaline phosphatase (APISO): 56 U/L (ref 35–144)
BUN/Creatinine Ratio: 9 (calc) (ref 6–22)
BUN: 16 mg/dL (ref 7–25)
CO2: 29 mmol/L (ref 20–32)
Calcium: 9.8 mg/dL (ref 8.6–10.3)
Chloride: 103 mmol/L (ref 98–110)
Creat: 1.69 mg/dL — ABNORMAL HIGH (ref 0.70–1.25)
GFR, Est African American: 48 mL/min/{1.73_m2} — ABNORMAL LOW (ref >=60)
GFR, Est Non African American: 42 mL/min/{1.73_m2} — ABNORMAL LOW (ref >=60)
Globulin: 2.8 g/dL (calc) (ref 1.9–3.7)
Glucose, Bld: 117 mg/dL — ABNORMAL HIGH (ref 65–99)
Potassium: 4.3 mmol/L (ref 3.5–5.3)
Sodium: 138 mmol/L (ref 135–146)
Total Bilirubin: 0.5 mg/dL (ref 0.2–1.2)
Total Protein: 6.9 g/dL (ref 6.1–8.1)

## 2020-07-07 LAB — CBC WITH DIFFERENTIAL/PLATELET
Absolute Monocytes: 577 cells/uL (ref 200–950)
Basophils Absolute: 20 cells/uL (ref 0–200)
Basophils Relative: 0.5 %
Eosinophils Absolute: 187 cells/uL (ref 15–500)
Eosinophils Relative: 4.8 %
HCT: 35 % — ABNORMAL LOW (ref 38.5–50.0)
Hemoglobin: 11.4 g/dL — ABNORMAL LOW (ref 13.2–17.1)
Lymphs Abs: 835 cells/uL — ABNORMAL LOW (ref 850–3900)
MCH: 27.4 pg (ref 27.0–33.0)
MCHC: 32.6 g/dL (ref 32.0–36.0)
MCV: 84.1 fL (ref 80.0–100.0)
MPV: 9 fL (ref 7.5–12.5)
Monocytes Relative: 14.8 %
Neutro Abs: 2282 cells/uL (ref 1500–7800)
Neutrophils Relative %: 58.5 %
Platelets: 193 10*3/uL (ref 140–400)
RBC: 4.16 10*6/uL — ABNORMAL LOW (ref 4.20–5.80)
RDW: 12.5 % (ref 11.0–15.0)
Total Lymphocyte: 21.4 %
WBC: 3.9 10*3/uL (ref 3.8–10.8)

## 2020-07-07 LAB — LIPID PANEL
Cholesterol: 117 mg/dL (ref <200)
HDL: 48 mg/dL (ref >=40)
LDL Cholesterol (Calc): 54 mg/dL (calc)
Non-HDL Cholesterol (Calc): 69 mg/dL (calc) (ref <130)
Total CHOL/HDL Ratio: 2.4 (calc) (ref <5.0)
Triglycerides: 73 mg/dL (ref <150)

## 2020-07-07 LAB — HEMOGLOBIN A1C
Hgb A1c MFr Bld: 6.6 % of total Hgb — ABNORMAL HIGH (ref <5.7)
Mean Plasma Glucose: 143 (calc)
eAG (mmol/L): 7.9 (calc)

## 2020-07-08 ENCOUNTER — Encounter: Payer: Self-pay | Admitting: Gastroenterology

## 2020-07-27 ENCOUNTER — Other Ambulatory Visit: Payer: Self-pay

## 2020-07-27 ENCOUNTER — Ambulatory Visit (INDEPENDENT_AMBULATORY_CARE_PROVIDER_SITE_OTHER): Payer: Medicare Other

## 2020-07-27 DIAGNOSIS — Z5181 Encounter for therapeutic drug level monitoring: Secondary | ICD-10-CM | POA: Diagnosis not present

## 2020-07-27 DIAGNOSIS — G459 Transient cerebral ischemic attack, unspecified: Secondary | ICD-10-CM

## 2020-07-27 DIAGNOSIS — Z8673 Personal history of transient ischemic attack (TIA), and cerebral infarction without residual deficits: Secondary | ICD-10-CM

## 2020-07-27 LAB — POCT INR: INR: 1.6 — AB (ref 2.0–3.0)

## 2020-07-27 NOTE — Patient Instructions (Signed)
Description   Take 1.5 tablets today and tomorrow, then resume same dosage 1 tablet daily.  Recheck INR in 3 weeks. Call the Coumadin clinic with any questions #[phone].

## 2020-08-17 ENCOUNTER — Other Ambulatory Visit: Payer: Self-pay | Admitting: Internal Medicine

## 2020-08-17 ENCOUNTER — Other Ambulatory Visit: Payer: Self-pay

## 2020-08-17 ENCOUNTER — Ambulatory Visit (INDEPENDENT_AMBULATORY_CARE_PROVIDER_SITE_OTHER): Payer: Medicare Other | Admitting: *Deleted

## 2020-08-17 DIAGNOSIS — Z8673 Personal history of transient ischemic attack (TIA), and cerebral infarction without residual deficits: Secondary | ICD-10-CM

## 2020-08-17 DIAGNOSIS — Z5181 Encounter for therapeutic drug level monitoring: Secondary | ICD-10-CM | POA: Diagnosis not present

## 2020-08-17 DIAGNOSIS — G459 Transient cerebral ischemic attack, unspecified: Secondary | ICD-10-CM | POA: Diagnosis not present

## 2020-08-17 LAB — POCT INR: INR: 1.7 — AB (ref 2.0–3.0)

## 2020-08-17 NOTE — Patient Instructions (Signed)
Description   Take 1.5 tablets today start taking 1 tablet daily except 1.5 tablets on Fridays. Recheck INR in 3 weeks. Call the Coumadin clinic with any questions #[phone].

## 2020-09-07 ENCOUNTER — Ambulatory Visit (INDEPENDENT_AMBULATORY_CARE_PROVIDER_SITE_OTHER): Payer: Medicare Other | Admitting: Gastroenterology

## 2020-09-07 ENCOUNTER — Encounter: Payer: Self-pay | Admitting: Gastroenterology

## 2020-09-07 VITALS — BP 110/80 | HR 55 | Ht 74.5 in | Wt 170.8 lb

## 2020-09-07 DIAGNOSIS — I255 Ischemic cardiomyopathy: Secondary | ICD-10-CM

## 2020-09-07 DIAGNOSIS — R1310 Dysphagia, unspecified: Secondary | ICD-10-CM | POA: Diagnosis not present

## 2020-09-07 DIAGNOSIS — Z1211 Encounter for screening for malignant neoplasm of colon: Secondary | ICD-10-CM

## 2020-09-07 DIAGNOSIS — R1314 Dysphagia, pharyngoesophageal phase: Secondary | ICD-10-CM

## 2020-09-07 NOTE — Patient Instructions (Signed)
If you are age 66 or older, your body mass index should be between 23-30. Your Body mass index is 21.64 kg/m. If this is out of the aforementioned range listed, please consider follow up with your Primary Care Provider.  If you are age 34 or younger, your body mass index should be between 19-25. Your Body mass index is 21.64 kg/m. If this is out of the aformentioned range listed, please consider follow up with your Primary Care Provider.   It was a pleasure to see you today!  Dr. Myrtie Neither

## 2020-09-07 NOTE — Progress Notes (Signed)
[facility] Consult Note:  History: Karl [name] 09/07/2020  Referring provider: Binnie Rail, MD  Reason for consult/chief complaint: Dysphagia (states he is having trouble with food getting stuck in throat. Onset for about 1 year. If he drinks fluids or clears throat it will pass easily throught throat. Denies ever having upper endo) and Colonoscopy (Has never had a colonoscopy. Denies having any GI concerns today)   Subjective  HPI:  This is a 64 year old man referred by primary care for consideration of colon cancer screening.  He has never had a colonoscopy or other screening test done.  He has severe cardiomyopathy with prior CVA (details below).  Karl [name] was accompanied by his daughter today who assists him with care. He has described at least a year of intermittent dysphagia where meat might feel stuck in the neck and then he has to clear his throat to get it to pass.  He will then try to drink some water to make sure it is all down.  On further questioning, it sounds like perhaps this is been going on since his stroke in 2019. He has been increasingly experiencing spasticity in the left arm and leg, especially with activity. He is able to walk a little and slowly, but spends most of his time in a chair or wheelchair. As he tells me that his appetite is good and he believes his weight is stable.  ROS:  Review of Systems  Constitutional: Positive for fatigue. Negative for appetite change and unexpected weight change.  HENT: Negative for mouth sores and voice change.   Eyes: Negative for pain and redness.  Respiratory: Negative for cough and shortness of breath.   Cardiovascular: Negative for chest pain and palpitations.  Genitourinary: Negative for dysuria and hematuria.  Musculoskeletal: Negative for arthralgias and myalgias.  Skin: Negative for pallor and rash.  Neurological: Negative for weakness and headaches.       Left arm and leg weakness with  spasticity  Hematological: Negative for adenopathy.     Past Medical History: Past Medical History:  Diagnosis Date  . Acute CVA (cerebrovascular accident) ([place]) 06/14/2018  . Coronary artery disease    blockage   Stent Dr. Lovena Le 15 years 1998  . Dyspnea   . Hypertension   . Stroke [facility]) 2019   denies residual on 06/14/2018  . Type II diabetes mellitus ([place]) 07/2014 dx     Past Surgical History: Past Surgical History:  Procedure Laterality Date  . CORONARY ANGIOPLASTY WITH STENT PLACEMENT  1998  . FRACTURE SURGERY    . GREEN LIGHT LASER TURP (TRANSURETHRAL RESECTION OF PROSTATE N/A 06/05/2018   Procedure: GREEN LIGHT LASER TURP , TRANSURETHRAL RESECTION OF PROSTATE;  Surgeon: [name], MD;  Location: [facility];  Service: Urology;  Laterality: N/A;  . TIBIA FRACTURE SURGERY Left 1980s     Family History: Family History  Problem Relation Age of Onset  . Diabetes Mother   . Diabetes Father   . Stroke Father 64  . Diabetes Sister   . Clotting disorder Sister 11  . Diabetes Other        nephew  . Diabetes Brother   . Diabetes Maternal Grandmother   . Colon cancer Neg Hx   . Esophageal cancer Neg Hx   . Pancreatic cancer Neg Hx   . Stomach cancer Neg Hx   . Liver disease Neg Hx     Social History: Social History   Socioeconomic History  . Marital  status: Married    Spouse name: Not on file  . Number of children: Not on file  . Years of education: Not on file  . Highest education level: Not on file  Occupational History  . Not on file  Tobacco Use  . Smoking status: Former Smoker    Packs/day: 2.00    Years: 5.00    Pack years: 10.00    Types: Cigarettes    Quit date: 12/04/2000    Years since quitting: 19.7  . Smokeless tobacco: Never Used  Vaping Use  . Vaping Use: Never used  Substance and Sexual Activity  . Alcohol use: Not Currently  . Drug use: Not Currently  . Sexual activity: Not on file  Other Topics Concern  . Not on file  Social  History Narrative   Former smoker - quit 1998 after stent -    Lives with dtr and 2 g-sons   Separated from wife in 2005, good terms   Employed with [employer] - walks at lot at work   No regular exercise   Social Determinants of Radio broadcast assistant Strain:   . Difficulty of Paying Living Expenses: Not on file  Food Insecurity:   . Worried About Charity fundraiser in the Last Year: Not on file  . Ran Out of Food in the Last Year: Not on file  Transportation Needs:   . Lack of Transportation (Medical): Not on file  . Lack of Transportation (Non-Medical): Not on file  Physical Activity:   . Days of Exercise per Week: Not on file  . Minutes of Exercise per Session: Not on file  Stress:   . Feeling of Stress : Not on file  Social Connections:   . Frequency of Communication with Friends and Family: Not on file  . Frequency of Social Gatherings with Friends and Family: Not on file  . Attends Religious Services: Not on file  . Active Member of Clubs or Organizations: Not on file  . Attends Archivist Meetings: Not on file  . Marital Status: Not on file    Allergies: Allergies  Allergen Reactions  . Lipitor [Atorvastatin] Nausea And Vomiting    Outpatient Meds: Current Outpatient Medications  Medication Sig Dispense Refill  . acetaminophen (TYLENOL) 325 MG tablet Take 1-2 tablets (325-650 mg total) by mouth every 4 (four) hours as needed for mild pain.    . baclofen (LIORESAL) 10 MG tablet Take 1-2 tablets (10-20 mg total) by mouth 3 (three) times daily as needed for muscle spasms. 120 each 5  . blood glucose meter kit and supplies KIT Dispense based on patient and insurance preference. Use up to four times daily as directed. (FOR E11.9). 1 each 0  . carvedilol (COREG) 12.5 MG tablet TAKE 1 TABLET BY MOUTH TWICE DAILY WITH MEALS 180 tablet 1  . Lancets 30G MISC Use to check blood sugars daily 100 each 3  . losartan (COZAAR) 25 MG tablet  Take 1 tablet by mouth once daily 90 tablet 1  . metFORMIN (GLUCOPHAGE) 1000 MG tablet TAKE 1 TABLET BY MOUTH TWICE DAILY WITH A MEAL 180 tablet 1  . polyethylene glycol (MIRALAX / GLYCOLAX) packet Take 17 g by mouth 2 (two) times daily. 60 each 0  . potassium chloride (KLOR-CON) 10 MEQ tablet Take 1 tablet by mouth twice daily 180 tablet 0  . rosuvastatin (CRESTOR) 20 MG tablet Take 1 tablet (20 mg total) by mouth daily. 90 tablet 3  .  warfarin (COUMADIN) 5 MG tablet TAKE 1 TABLET DAILY OR AS DIRECTED BY COUMADIN CLINIC 35 tablet 0   No current facility-administered medications for this visit.      ___________________________________________________________________ Objective   Exam:  BP 110/80   Pulse (!) 55   Ht 6' 2.5" (1.892 m)   Wt 170 lb 12.8 oz (77.5 kg)   SpO2 99%   BMI 21.64 kg/m    General: Wheelchair-bound, pleasant and conversational.  Normal vocal quality  Eyes: sclera anicteric, no redness  ENT: oral mucosa moist without lesions, he is almost edentulous, with 3 teeth in poor repair on the bottom front.  CV: RRR without murmur, S1/S2, no JVD, no peripheral edema  Resp: clear to auscultation bilaterally, normal RR and effort noted  GI: soft, no tenderness, with active bowel sounds.  Limited exam in wheelchair  Skin; warm and dry, no rash or jaundice noted  Neuro: Left arm and leg gross motor function weaker than right   Other:  July 2019 echocardiogram  Left ventricle: The cavity size was moderately dilated. Wall    thickness was normal. The estimated ejection fraction was 20%.    Diffuse hypokinesis. There is akinesis of the anteroseptal and    anterior myocardium. Indeterminate diastolic function. Definity    contrast shows swirling contrast and loosely organized thrombotic    material at the LV apex.  - Aortic valve: Mildly calcified annulus. Trileaflet.  - Mitral valve: There was mild regurgitation.  - Right atrium: Central venous pressure (est):  3 mm Hg.  - Atrial septum: No defect or patent foramen ovale was identified.  - Tricuspid valve: There was trivial regurgitation.  - Pulmonary arteries: Systolic pressure could not be accurately    estimated.  - Pericardium, extracardiac: There was no pericardial effusion.    Assessment: Encounter Diagnoses  Name Primary?  . Pharyngoesophageal dysphagia Yes  . Problems with swallowing and mastication   . 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  It is made worse because he is unable to chew food properly due to lack of dentition.  He needs the last few teeth extracted and dentures fitted.  Meat should be ground and he should have something to drink after every couple of bites.  I also recommend a chin tuck maneuver with swallowing. No current plan for upper endoscopy.  Regarding colon cancer screening, I think it would not be appropriate for this patient.  He is not likely to have a 10-year life expectancy given the severity of his cardiac condition, and endoscopic procedures would be high risk.  He and his daughter were grateful for the thorough chart review and consideration of the risks and benefits of screening procedures.  They understand that there is risk of colorectal cancer screening is not done.   Thank you for the courtesy of this consult.  Please call me with any questions or concerns.  Nelida Meuse III  CC: Referring provider noted above

## 2020-09-08 ENCOUNTER — Ambulatory Visit (INDEPENDENT_AMBULATORY_CARE_PROVIDER_SITE_OTHER): Payer: Medicare Other | Admitting: *Deleted

## 2020-09-08 ENCOUNTER — Other Ambulatory Visit: Payer: Self-pay

## 2020-09-08 DIAGNOSIS — G459 Transient cerebral ischemic attack, unspecified: Secondary | ICD-10-CM

## 2020-09-08 DIAGNOSIS — Z8673 Personal history of transient ischemic attack (TIA), and cerebral infarction without residual deficits: Secondary | ICD-10-CM | POA: Diagnosis not present

## 2020-09-08 DIAGNOSIS — Z5181 Encounter for therapeutic drug level monitoring: Secondary | ICD-10-CM | POA: Diagnosis not present

## 2020-09-08 LAB — POCT INR: INR: 1.6 — AB (ref 2.0–3.0)

## 2020-09-08 NOTE — Patient Instructions (Signed)
Description   Take 1.5 tablets today start taking 1 tablet daily except 1.5 tablets on Mondays and Fridays. Recheck INR in 3 weeks. Call the Coumadin clinic with any questions #[phone].

## 2020-09-22 ENCOUNTER — Other Ambulatory Visit: Payer: Self-pay | Admitting: Internal Medicine

## 2020-09-30 ENCOUNTER — Other Ambulatory Visit: Payer: Self-pay

## 2020-09-30 ENCOUNTER — Ambulatory Visit (INDEPENDENT_AMBULATORY_CARE_PROVIDER_SITE_OTHER): Payer: Medicare Other | Admitting: *Deleted

## 2020-09-30 DIAGNOSIS — G459 Transient cerebral ischemic attack, unspecified: Secondary | ICD-10-CM

## 2020-09-30 DIAGNOSIS — Z8673 Personal history of transient ischemic attack (TIA), and cerebral infarction without residual deficits: Secondary | ICD-10-CM

## 2020-09-30 DIAGNOSIS — Z5181 Encounter for therapeutic drug level monitoring: Secondary | ICD-10-CM

## 2020-09-30 LAB — POCT INR: INR: 2.1 (ref 2.0–3.0)

## 2020-09-30 NOTE — Patient Instructions (Signed)
Description   Continue taking Warfarin 1 tablet daily except 1.5 tablets on Mondays and Fridays. Recheck INR in 4 weeks. Call the Coumadin clinic with any questions #[phone].     

## 2020-10-25 ENCOUNTER — Other Ambulatory Visit: Payer: Self-pay | Admitting: Internal Medicine

## 2020-10-26 ENCOUNTER — Other Ambulatory Visit: Payer: Self-pay

## 2020-10-26 ENCOUNTER — Ambulatory Visit (INDEPENDENT_AMBULATORY_CARE_PROVIDER_SITE_OTHER): Payer: Medicare Other | Admitting: *Deleted

## 2020-10-26 DIAGNOSIS — G459 Transient cerebral ischemic attack, unspecified: Secondary | ICD-10-CM

## 2020-10-26 DIAGNOSIS — Z8673 Personal history of transient ischemic attack (TIA), and cerebral infarction without residual deficits: Secondary | ICD-10-CM | POA: Diagnosis not present

## 2020-10-26 DIAGNOSIS — Z5181 Encounter for therapeutic drug level monitoring: Secondary | ICD-10-CM | POA: Diagnosis not present

## 2020-10-26 LAB — POCT INR: INR: 2.6 (ref 2.0–3.0)

## 2020-10-26 NOTE — Patient Instructions (Signed)
Description   Continue taking Warfarin 1 tablet daily except 1.5 tablets on Mondays and Fridays. Recheck INR in 5 weeks. Call the Coumadin clinic with any questions #[phone].     

## 2020-11-22 ENCOUNTER — Other Ambulatory Visit: Payer: Self-pay | Admitting: Internal Medicine

## 2020-11-30 ENCOUNTER — Other Ambulatory Visit: Payer: Self-pay

## 2020-11-30 ENCOUNTER — Ambulatory Visit (INDEPENDENT_AMBULATORY_CARE_PROVIDER_SITE_OTHER): Payer: Medicare Other | Admitting: *Deleted

## 2020-11-30 DIAGNOSIS — G459 Transient cerebral ischemic attack, unspecified: Secondary | ICD-10-CM | POA: Diagnosis not present

## 2020-11-30 DIAGNOSIS — Z5181 Encounter for therapeutic drug level monitoring: Secondary | ICD-10-CM | POA: Diagnosis not present

## 2020-11-30 DIAGNOSIS — Z8673 Personal history of transient ischemic attack (TIA), and cerebral infarction without residual deficits: Secondary | ICD-10-CM

## 2020-11-30 LAB — POCT INR: INR: 2 (ref 2.0–3.0)

## 2020-11-30 NOTE — Patient Instructions (Signed)
Description   Continue taking Warfarin 1 tablet daily except 1.5 tablets on Mondays and Fridays. Recheck INR in 6 weeks. Call the Coumadin clinic with any questions #[phone].

## 2020-12-12 ENCOUNTER — Emergency Department (HOSPITAL_COMMUNITY): Payer: Medicare Other

## 2020-12-12 ENCOUNTER — Other Ambulatory Visit: Payer: Self-pay

## 2020-12-12 ENCOUNTER — Encounter (HOSPITAL_COMMUNITY): Payer: Self-pay

## 2020-12-12 ENCOUNTER — Inpatient Hospital Stay (HOSPITAL_COMMUNITY)
Admission: EM | Admit: 2020-12-12 | Discharge: 2020-12-17 | DRG: 177 | Disposition: A | Discharge status: Home / Self Care | Payer: Medicare Other | Attending: Internal Medicine | Admitting: Internal Medicine

## 2020-12-12 DIAGNOSIS — I130 Hypertensive heart and chronic kidney disease with heart failure and stage 1 through stage 4 chronic kidney disease, or unspecified chronic kidney disease: Secondary | ICD-10-CM | POA: Diagnosis not present

## 2020-12-12 DIAGNOSIS — I5022 Chronic systolic (congestive) heart failure: Secondary | ICD-10-CM | POA: Diagnosis not present

## 2020-12-12 DIAGNOSIS — R29898 Other symptoms and signs involving the musculoskeletal system: Secondary | ICD-10-CM | POA: Diagnosis not present

## 2020-12-12 DIAGNOSIS — N1831 Chronic kidney disease, stage 3a: Secondary | ICD-10-CM | POA: Diagnosis present

## 2020-12-12 DIAGNOSIS — Z7984 Long term (current) use of oral hypoglycemic drugs: Secondary | ICD-10-CM

## 2020-12-12 DIAGNOSIS — Z955 Presence of coronary angioplasty implant and graft: Secondary | ICD-10-CM

## 2020-12-12 DIAGNOSIS — Z833 Family history of diabetes mellitus: Secondary | ICD-10-CM

## 2020-12-12 DIAGNOSIS — N179 Acute kidney failure, unspecified: Secondary | ICD-10-CM

## 2020-12-12 DIAGNOSIS — E1122 Type 2 diabetes mellitus with diabetic chronic kidney disease: Secondary | ICD-10-CM | POA: Diagnosis present

## 2020-12-12 DIAGNOSIS — Z87891 Personal history of nicotine dependence: Secondary | ICD-10-CM

## 2020-12-12 DIAGNOSIS — U071 COVID-19: Secondary | ICD-10-CM | POA: Diagnosis not present

## 2020-12-12 DIAGNOSIS — I10 Essential (primary) hypertension: Secondary | ICD-10-CM | POA: Diagnosis present

## 2020-12-12 DIAGNOSIS — E860 Dehydration: Secondary | ICD-10-CM | POA: Diagnosis present

## 2020-12-12 DIAGNOSIS — R059 Cough, unspecified: Secondary | ICD-10-CM | POA: Diagnosis not present

## 2020-12-12 DIAGNOSIS — I2510 Atherosclerotic heart disease of native coronary artery without angina pectoris: Secondary | ICD-10-CM | POA: Diagnosis present

## 2020-12-12 DIAGNOSIS — Z9079 Acquired absence of other genital organ(s): Secondary | ICD-10-CM

## 2020-12-12 DIAGNOSIS — R52 Pain, unspecified: Secondary | ICD-10-CM | POA: Diagnosis not present

## 2020-12-12 DIAGNOSIS — J1282 Pneumonia due to coronavirus disease 2019: Secondary | ICD-10-CM | POA: Diagnosis not present

## 2020-12-12 DIAGNOSIS — Z832 Family history of diseases of the blood and blood-forming organs and certain disorders involving the immune mechanism: Secondary | ICD-10-CM

## 2020-12-12 DIAGNOSIS — Z7901 Long term (current) use of anticoagulants: Secondary | ICD-10-CM

## 2020-12-12 DIAGNOSIS — E1169 Type 2 diabetes mellitus with other specified complication: Secondary | ICD-10-CM | POA: Diagnosis present

## 2020-12-12 DIAGNOSIS — R509 Fever, unspecified: Secondary | ICD-10-CM | POA: Diagnosis not present

## 2020-12-12 DIAGNOSIS — J439 Emphysema, unspecified: Secondary | ICD-10-CM | POA: Diagnosis not present

## 2020-12-12 DIAGNOSIS — R531 Weakness: Secondary | ICD-10-CM

## 2020-12-12 DIAGNOSIS — R262 Difficulty in walking, not elsewhere classified: Secondary | ICD-10-CM

## 2020-12-12 DIAGNOSIS — R2689 Other abnormalities of gait and mobility: Secondary | ICD-10-CM | POA: Diagnosis not present

## 2020-12-12 DIAGNOSIS — I959 Hypotension, unspecified: Secondary | ICD-10-CM | POA: Diagnosis not present

## 2020-12-12 DIAGNOSIS — I69354 Hemiplegia and hemiparesis following cerebral infarction affecting left non-dominant side: Secondary | ICD-10-CM | POA: Diagnosis not present

## 2020-12-12 DIAGNOSIS — E1159 Type 2 diabetes mellitus with other circulatory complications: Secondary | ICD-10-CM | POA: Diagnosis present

## 2020-12-12 DIAGNOSIS — Z823 Family history of stroke: Secondary | ICD-10-CM

## 2020-12-12 DIAGNOSIS — Z79899 Other long term (current) drug therapy: Secondary | ICD-10-CM

## 2020-12-12 DIAGNOSIS — M79605 Pain in left leg: Secondary | ICD-10-CM | POA: Diagnosis not present

## 2020-12-12 DIAGNOSIS — E785 Hyperlipidemia, unspecified: Secondary | ICD-10-CM | POA: Diagnosis present

## 2020-12-12 LAB — CBC WITH DIFFERENTIAL/PLATELET
Abs Immature Granulocytes: 0.02 10*3/uL (ref 0.00–0.07)
Basophils Absolute: 0 10*3/uL (ref 0.0–0.1)
Basophils Relative: 0 %
Eosinophils Absolute: 0 10*3/uL (ref 0.0–0.5)
Eosinophils Relative: 0 %
HCT: 39.3 % (ref 39.0–52.0)
Hemoglobin: 12.4 g/dL — ABNORMAL LOW (ref 13.0–17.0)
Immature Granulocytes: 1 %
Lymphocytes Relative: 21 %
Lymphs Abs: 0.7 10*3/uL (ref 0.7–4.0)
MCH: 25.9 pg — ABNORMAL LOW (ref 26.0–34.0)
MCHC: 31.6 g/dL (ref 30.0–36.0)
MCV: 82.2 fL (ref 80.0–100.0)
Monocytes Absolute: 0.6 10*3/uL (ref 0.1–1.0)
Monocytes Relative: 16 %
Neutro Abs: 2.2 10*3/uL (ref 1.7–7.7)
Neutrophils Relative %: 62 %
Platelets: 154 10*3/uL (ref 150–400)
RBC: 4.78 MIL/uL (ref 4.22–5.81)
RDW: 14.2 % (ref 11.5–15.5)
WBC: 3.6 10*3/uL — ABNORMAL LOW (ref 4.0–10.5)
nRBC: 0 % (ref 0.0–0.2)

## 2020-12-12 LAB — COMPREHENSIVE METABOLIC PANEL
ALT: 43 U/L (ref 0–44)
AST: 63 U/L — ABNORMAL HIGH (ref 15–41)
Albumin: 3.8 g/dL (ref 3.5–5.0)
Alkaline Phosphatase: 51 U/L (ref 38–126)
Anion gap: 11 (ref 5–15)
BUN: 32 mg/dL — ABNORMAL HIGH (ref 8–23)
CO2: 24 mmol/L (ref 22–32)
Calcium: 9.4 mg/dL (ref 8.9–10.3)
Chloride: 99 mmol/L (ref 98–111)
Creatinine, Ser: 1.93 mg/dL — ABNORMAL HIGH (ref 0.61–1.24)
GFR, Estimated: 38 mL/min — ABNORMAL LOW (ref >60)
Glucose, Bld: 165 mg/dL — ABNORMAL HIGH (ref 70–99)
Potassium: 4.2 mmol/L (ref 3.5–5.1)
Sodium: 134 mmol/L — ABNORMAL LOW (ref 135–145)
Total Bilirubin: 0.8 mg/dL (ref 0.3–1.2)
Total Protein: 8.1 g/dL (ref 6.5–8.1)

## 2020-12-12 LAB — URINALYSIS, ROUTINE W REFLEX MICROSCOPIC
Bilirubin Urine: NEGATIVE
Glucose, UA: NEGATIVE mg/dL
Ketones, ur: NEGATIVE mg/dL
Leukocytes,Ua: NEGATIVE
Nitrite: NEGATIVE
Protein, ur: 100 mg/dL — AB
Specific Gravity, Urine: 1.023 (ref 1.005–1.030)
pH: 5 (ref 5.0–8.0)

## 2020-12-12 LAB — RESP PANEL BY RT-PCR (FLU A&B, COVID) ARPGX2
Influenza A by PCR: NEGATIVE
Influenza B by PCR: NEGATIVE
SARS Coronavirus 2 by RT PCR: POSITIVE — AB

## 2020-12-12 NOTE — ED Notes (Signed)
Patient given urinal.

## 2020-12-12 NOTE — ED Provider Notes (Signed)
[facility] DEPT Provider Note   CSN: [id number] Arrival date & time: 12/12/20  1504     History Chief Complaint  Patient presents with  . Weakness  . Cough  . Fever    Karl [name] is a 65 y.o. male.  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  Patient states he has always been weak on the left side but able to get around by using a walker.  Over the last month his weakness on the left side has been getting worse, specifically in the past 4 days to the point that he is not able to ambulate even with a walker.  He denies any associated headache, vision change, speech change, sensory change or other strokelike symptoms.  He states he is felt very fatigued over the past couple days, slight decrease in appetite but no other acute infectious complaints including chest pain, cough, abdominal pain, vomiting/diarrhea.  Past Medical History:  Diagnosis Date  . Acute CVA (cerebrovascular accident) ([place]) 06/14/2018  . Coronary artery disease    blockage   Stent Dr. Lovena Le 15 years 1998  . Dyspnea   . Hypertension   . Stroke [facility]) 2019   denies residual on 06/14/2018  . Type II diabetes mellitus ([place]) 07/2014 dx    Patient Active Problem List   Diagnosis Date Noted  . Spastic hemiparesis ([place]) 07/06/2020  . 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 Shingles 06/24/2019  . Left wrist pain 12/13/2018  . 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 Chronic anticoagulation   . Acute ischemic right PCA stroke ([place]) 06/18/2018  . Urinary retention 06/05/2018  . Encounter for therapeutic drug monitoring 02/08/2018  . Coronary artery disease 12/19/2017  . TIA (transient ischemic attack) 12/19/2017  . Hip flexor tendinitis 05/22/2017  . Pain of both hip joints 04/25/2017  . Dyslipidemia 09/01/2015  .  BPH without urinary obstruction 09/01/2015  . DM (diabetes mellitus), type 2, uncontrolled, periph vascular complic ([place]) 77/[id number]/4235  . Essential hypertension 04/20/2009  . Cardiomyopathy, ischemic 04/20/2009  . Left bundle branch block 04/20/2009  . Chronic systolic heart failure ([place]) 04/20/2009    Past Surgical History:  Procedure Laterality Date  . CORONARY ANGIOPLASTY WITH STENT PLACEMENT  1998  . FRACTURE SURGERY    . GREEN LIGHT LASER TURP (TRANSURETHRAL RESECTION OF PROSTATE N/A 06/05/2018   Procedure: GREEN LIGHT LASER TURP , TRANSURETHRAL RESECTION OF PROSTATE;  Surgeon: [name], MD;  Location: [facility] ORS;  Service: Urology;  Laterality: N/A;  . TIBIA FRACTURE SURGERY Left 1980s       Family History  Problem Relation Age of Onset  . Diabetes Mother   . Diabetes Father   . Stroke Father 48  . Diabetes Sister   . Clotting disorder Sister 21  . Diabetes Other        nephew  . Diabetes Brother   . Diabetes Maternal Grandmother   . Colon cancer Neg Hx   . Esophageal cancer Neg Hx   . Pancreatic cancer Neg Hx   . Stomach cancer Neg Hx   . Liver disease Neg Hx     Social History   Tobacco Use  . Smoking status: Former Smoker    Packs/day: 2.00    Years: 5.00    Pack years: 10.00    Types: Cigarettes  Quit date: 12/04/2000    Years since quitting: 20.0  . Smokeless tobacco: Never Used  Vaping Use  . Vaping Use: Never used  Substance Use Topics  . Alcohol use: Not Currently  . Drug use: Not Currently    Home Medications Prior to Admission medications   Medication Sig Start Date End Date Taking? Authorizing Provider  acetaminophen (TYLENOL) 325 MG tablet Take 1-2 tablets (325-650 mg total) by mouth every 4 (four) hours as needed for mild pain. 07/09/18   Love, Ivan Anchors, PA-C  baclofen (LIORESAL) 10 MG tablet Take 1-2 tablets (10-20 mg total) by mouth 3 (three) times daily as needed for muscle spasms. 07/06/20   Binnie Rail, MD  blood glucose meter kit  and supplies KIT Dispense based on patient and insurance preference. Use up to four times daily as directed. (FOR E11.9). 07/15/18   Binnie Rail, MD  carvedilol (COREG) 12.5 MG tablet TAKE 1 TABLET BY MOUTH TWICE DAILY WITH MEALS 08/17/20   Binnie Rail, MD  Lancets 30G MISC Use to check blood sugars daily 07/15/18   Binnie Rail, MD  losartan (COZAAR) 25 MG tablet Take 1 tablet by mouth once daily 06/15/20   Binnie Rail, MD  metFORMIN (GLUCOPHAGE) 1000 MG tablet TAKE 1 TABLET BY MOUTH TWICE DAILY WITH A MEAL 06/23/20   Burns, Claudina Lick, MD  polyethylene glycol (MIRALAX / GLYCOLAX) packet Take 17 g by mouth 2 (two) times daily. 07/09/18   Love, Ivan Anchors, PA-C  potassium chloride (KLOR-CON) 10 MEQ tablet TAKE 1  BY MOUTH TWICE DAILY 11/22/20   Binnie Rail, MD  rosuvastatin (CRESTOR) 20 MG tablet Take 1 tablet (20 mg total) by mouth daily. 01/30/20 09/07/20  Evans Lance, MD  warfarin (COUMADIN) 5 MG tablet TAKE 1 TO 1 & 1/2 (ONE & ONE-HALF) TABLETS BY MOUTH ONCE DAILY AS DIRECTED BY  COUMADIN  CLINIC 10/25/20   Evans Lance, MD    Allergies    Lipitor [atorvastatin]  Review of Systems   Review of Systems  Constitutional: Positive for appetite change and fatigue. Negative for chills and fever.  HENT: Negative for congestion.   Eyes: Negative for visual disturbance.  Respiratory: Negative for shortness of breath.   Cardiovascular: Negative for chest pain and leg swelling.  Gastrointestinal: Negative for abdominal pain, diarrhea and vomiting.  Genitourinary: Negative for dysuria.  Musculoskeletal: Negative for myalgias.  Skin: Negative for rash.  Neurological: Positive for weakness. Negative for dizziness, facial asymmetry, speech difficulty, numbness and headaches.    Physical Exam Updated Vital Signs BP (!) 123/109   Pulse 97   Temp 100.2 F (37.9 C) (Oral)   Resp (!) 21   Ht '6\' 2"'  (1.88 m)   Wt 68 kg   SpO2 98%   BMI 19.26 kg/m   Physical Exam Vitals and nursing note  reviewed.  Constitutional:      Appearance: Normal appearance.  HENT:     Head: Normocephalic.     Mouth/Throat:     Mouth: Mucous membranes are moist.  Eyes:     Extraocular Movements: Extraocular movements intact.     Pupils: Pupils are equal, round, and reactive to light.  Cardiovascular:     Rate and Rhythm: Normal rate.  Pulmonary:     Effort: Pulmonary effort is normal. No respiratory distress.  Abdominal:     Palpations: Abdomen is soft.     Tenderness: There is no abdominal tenderness.  Musculoskeletal:  General: No swelling.  Skin:    General: Skin is warm.  Neurological:     Mental Status: He is alert and oriented to person, place, and time. Mental status is at baseline.     Comments: 4/5 strength in the left upper and lower extremity, 5/5 strength in the right upper and lower extremity  Psychiatric:        Mood and Affect: Mood normal.     ED Results / Procedures / Treatments   Labs (all labs ordered are listed, but only abnormal results are displayed) Labs Reviewed  COMPREHENSIVE METABOLIC PANEL - Abnormal; Notable for the following components:      Result Value   Sodium 134 (*)    Glucose, Bld 165 (*)    BUN 32 (*)    Creatinine, Ser 1.93 (*)    AST 63 (*)    GFR, Estimated 38 (*)    All other components within normal limits  CBC WITH DIFFERENTIAL/PLATELET - Abnormal; Notable for the following components:   WBC 3.6 (*)    Hemoglobin 12.4 (*)    MCH 25.9 (*)    All other components within normal limits  RESP PANEL BY RT-PCR (FLU A&B, COVID) ARPGX2  URINALYSIS, ROUTINE W REFLEX MICROSCOPIC    EKG None  Radiology DG Chest 2 View  Result Date: 12/12/2020 CLINICAL DATA:  Cough and fever over the last 5 days. EXAM: CHEST - 2 VIEW COMPARISON:  06/14/2018 FINDINGS: Heart size is normal. Mediastinal shadows are normal. There is evidence of emphysema with lucency and pulmonary scarring. No sign of infiltrate, collapse or effusion. There may be central  bronchial thickening, suggesting bronchitis. No significant bone finding. IMPRESSION: Emphysema. Possible bronchitis. No consolidation or collapse. Electronically Signed   By: Nelson Chimes M.D.   On: 12/12/2020 16:24    Procedures Procedures (including critical care time)  Medications Ordered in ED Medications - No data to display  ED Course  I have reviewed the triage vital signs and the nursing notes.  Pertinent labs & imaging results that were available during my care of the patient were reviewed by me and considered in my medical decision making (see chart for details).    MDM Rules/Calculators/A&P                          65 year old male presents to the emergency department for evaluation of worsening left-sided weakness and difficulty ambulating.  He did have a low-grade fever on arrival, denies any other infectious complaints.  He is well-appearing.  On his neuro exam he has slight weakness in the left upper and lower extremity when compared to the right.  This is reportedly baseline from her previous stroke.  However patient is unable to fully weight-bear and walk using the left lower extremity even with a walker.  He states he has been not been able to ambulate for the past 4 days.  Blood work shows some mild dehydration, urinalysis shows no infection, there is no significant leukocytosis.  This x-ray showed no focal pneumonia or abnormality.  Head CT identifies old strokes without any other significant changes.  Due to patient's inability to ambulate initial thought was to admit for neuro evaluation and nonemergent MRI for further evaluation.  After admission COVID test did come back positive.  This very well may be the root of the patient's weakness.  Patients evaluation and results requires admission for further treatment and care. Patient agrees with admission plan, offers  no new complaints and is stable/unchanged at time of admit.  Final Clinical Impression(s) / ED  Diagnoses Final diagnoses:  None    Rx / DC Orders ED Discharge Orders    None       Lorelle Gibbs, DO 12/12/20 2315

## 2020-12-12 NOTE — ED Triage Notes (Addendum)
Per EMS- Patient is from home. Patient's family reports that the patient has been having malaise and weakness x 5 days. Patient unable to be seen by PCp and family called EMS.  Patient states left- sided weakness from a previous stroke.  Patient also had a T. 100.2 in triage and states he has ben having a cough x 4 days.

## 2020-12-12 NOTE — H&P (Signed)
History and Physical    Karl [name]:[id number] DOB: [birth year] DOA: 12/12/2020  PCP: Binnie Rail, MD Patient coming from: Home  Chief Complaint: Weakness, cough  HPI: Karl [name] is a 65 y.o. male with medical history significant of embolic stroke on Coumadin with residual left-sided weakness and spasticity, hyperlipidemia, hypertension, chronic systolic heart failure (EF 20% on echo done 06/2018), noninsulin-dependent type 2 diabetes, CAD status post PCI presenting to the ED for evaluation of weakness and cough.  Patient states he has chronic left-sided weakness in his arm and leg since his prior stroke.  However, he feels that for the past 4 to 5 days he is more weak.  He is normally able to ambulate with a walker but is now having difficulty doing so.  Denies any numbness anywhere.  Denies changes in vision.  He is vaccinated against Covid but has not had his booster shot.  For the past few days he has had fevers, chills, cough, and a poor appetite.  Denies shortness of breath or chest pain.  Denies nausea, vomiting, abdominal pain, diarrhea, or dysuria.  ED Course: Temperature 100.2 F, remainder of vital signs stable. WBC 3.6, hemoglobin 12.4 (at baseline), platelet count 154K. Sodium 134, potassium 4.2, chloride 99, bicarb 24, BUN 32, creatinine 1.9 (was 1.6 in August 2021), glucose 165. UA not suggestive of infection. SARS-CoV-2 PCR test positive. Influenza panel negative.  Chest x-ray showing emphysema and possible bronchitis. No consolidation or infiltrate.  Head CT negative for acute infarct.  Review of Systems:  All systems reviewed and apart from history of presenting illness, are negative.  Past Medical History:  Diagnosis Date  . Acute CVA (cerebrovascular accident) ([place]) 06/14/2018  . Coronary artery disease    blockage   Stent Dr. Lovena Le 15 years 1998  . Dyspnea   . Hypertension   . Stroke [facility]) 2019   denies residual on 06/14/2018  . Type II diabetes mellitus  ([place]) 07/2014 dx    Past Surgical History:  Procedure Laterality Date  . CORONARY ANGIOPLASTY WITH STENT PLACEMENT  1998  . FRACTURE SURGERY    . GREEN LIGHT LASER TURP (TRANSURETHRAL RESECTION OF PROSTATE N/A 06/05/2018   Procedure: GREEN LIGHT LASER TURP , TRANSURETHRAL RESECTION OF PROSTATE;  Surgeon: [name], MD;  Location: [facility] ORS;  Service: Urology;  Laterality: N/A;  . TIBIA FRACTURE SURGERY Left 1980s     reports that he quit smoking about 20 years ago. His smoking use included cigarettes. He has a 10.00 pack-year smoking history. He has never used smokeless tobacco. He reports previous alcohol use. He reports previous drug use.  Allergies  Allergen Reactions  . Lipitor [Atorvastatin] Nausea And Vomiting    Family History  Problem Relation Age of Onset  . Diabetes Mother   . Diabetes Father   . Stroke Father 31  . Diabetes Sister   . Clotting disorder Sister 47  . Diabetes Other        nephew  . Diabetes Brother   . Diabetes Maternal Grandmother   . Colon cancer Neg Hx   . Esophageal cancer Neg Hx   . Pancreatic cancer Neg Hx   . Stomach cancer Neg Hx   . Liver disease Neg Hx     Prior to Admission medications   Medication Sig Start Date End Date Taking? Authorizing Provider  acetaminophen (TYLENOL) 325 MG tablet Take 1-2 tablets (325-650 mg total) by mouth every 4 (four) hours as needed for mild pain.  07/09/18   Love, Ivan Anchors, PA-C  baclofen (LIORESAL) 10 MG tablet Take 1-2 tablets (10-20 mg total) by mouth 3 (three) times daily as needed for muscle spasms. 07/06/20   Binnie Rail, MD  blood glucose meter kit and supplies KIT Dispense based on patient and insurance preference. Use up to four times daily as directed. (FOR E11.9). 07/15/18   Binnie Rail, MD  carvedilol (COREG) 12.5 MG tablet TAKE 1 TABLET BY MOUTH TWICE DAILY WITH MEALS 08/17/20   Binnie Rail, MD  Lancets 30G MISC Use to check blood sugars daily 07/15/18   Binnie Rail, MD  losartan  (COZAAR) 25 MG tablet Take 1 tablet by mouth once daily 06/15/20   Binnie Rail, MD  metFORMIN (GLUCOPHAGE) 1000 MG tablet TAKE 1 TABLET BY MOUTH TWICE DAILY WITH A MEAL 06/23/20   Burns, Claudina Lick, MD  polyethylene glycol (MIRALAX / GLYCOLAX) packet Take 17 g by mouth 2 (two) times daily. 07/09/18   Love, Ivan Anchors, PA-C  potassium chloride (KLOR-CON) 10 MEQ tablet TAKE 1  BY MOUTH TWICE DAILY 11/22/20   Binnie Rail, MD  rosuvastatin (CRESTOR) 20 MG tablet Take 1 tablet (20 mg total) by mouth daily. 01/30/20 09/07/20  Evans Lance, MD  warfarin (COUMADIN) 5 MG tablet TAKE 1 TO 1 & 1/2 (ONE & ONE-HALF) TABLETS BY MOUTH ONCE DAILY AS DIRECTED BY  COUMADIN  CLINIC 10/25/20   Evans Lance, MD    Physical Exam: Vitals:   12/12/20 1519 12/12/20 1533 12/12/20 2046 12/12/20 2205  BP:   (!) 123/109 110/70  Pulse:   97 91  Resp:   (!) 21 17  Temp:      TempSrc:      SpO2: 99%  98% 94%  Weight:  68 kg    Height:  6' 2" (1.88 m)      Physical Exam Constitutional:      General: He is not in acute distress. HENT:     Head: Normocephalic and atraumatic.  Eyes:     Extraocular Movements: Extraocular movements intact.     Conjunctiva/sclera: Conjunctivae normal.  Cardiovascular:     Rate and Rhythm: Normal rate and regular rhythm.     Pulses: Normal pulses.  Pulmonary:     Effort: Pulmonary effort is normal.     Breath sounds: Normal breath sounds. No wheezing or rales.  Abdominal:     General: Bowel sounds are normal. There is no distension.     Palpations: Abdomen is soft.     Tenderness: There is no abdominal tenderness.  Musculoskeletal:        General: No swelling or tenderness.     Cervical back: Normal range of motion and neck supple.  Skin:    General: Skin is warm and dry.  Neurological:     Mental Status: He is alert and oriented to person, place, and time.     Comments: Speech fluent, no facial droop Strength 4 out of 5 in the left upper and lower extremities.  Strength 5  out of 5 in the right upper and lower extremities. Sensation to light touch intact throughout.     Labs on Admission: I have personally reviewed following labs and imaging studies  CBC: Recent Labs  Lab 12/12/20 2052  WBC 3.6*  NEUTROABS 2.2  HGB 12.4*  HCT 39.3  MCV 82.2  PLT 196   Basic Metabolic Panel: Recent Labs  Lab 12/12/20 2052  NA 134*  K 4.2  CL 99  CO2 24  GLUCOSE 165*  BUN 32*  CREATININE 1.93*  CALCIUM 9.4   GFR: Estimated Creatinine Clearance: 36.2 mL/min (A) (by C-G formula based on SCr of 1.93 mg/dL (H)). Liver Function Tests: Recent Labs  Lab 12/12/20 2052  AST 63*  ALT 43  ALKPHOS 51  BILITOT 0.8  PROT 8.1  ALBUMIN 3.8   No results for input(s): LIPASE, AMYLASE in the last 168 hours. No results for input(s): AMMONIA in the last 168 hours. Coagulation Profile: No results for input(s): INR, PROTIME in the last 168 hours. Cardiac Enzymes: No results for input(s): CKTOTAL, CKMB, CKMBINDEX, TROPONINI in the last 168 hours. BNP (last 3 results) No results for input(s): PROBNP in the last 8760 hours. HbA1C: No results for input(s): HGBA1C in the last 72 hours. CBG: No results for input(s): GLUCAP in the last 168 hours. Lipid Profile: No results for input(s): CHOL, HDL, LDLCALC, TRIG, CHOLHDL, LDLDIRECT in the last 72 hours. Thyroid Function Tests: No results for input(s): TSH, T4TOTAL, FREET4, T3FREE, THYROIDAB in the last 72 hours. Anemia Panel: No results for input(s): VITAMINB12, FOLATE, FERRITIN, TIBC, IRON, RETICCTPCT in the last 72 hours. Urine analysis:    Component Value Date/Time   COLORURINE AMBER (A) 12/12/2020 2052   APPEARANCEUR CLOUDY (A) 12/12/2020 2052   LABSPEC 1.023 12/12/2020 2052   PHURINE 5.0 12/12/2020 2052   GLUCOSEU NEGATIVE 12/12/2020 2052   HGBUR MODERATE (A) 12/12/2020 2052   BILIRUBINUR NEGATIVE 12/12/2020 2052   BILIRUBINUR moderate 07/09/2014 1428   KETONESUR NEGATIVE 12/12/2020 2052   PROTEINUR 100 (A)  12/12/2020 2052   UROBILINOGEN 0.2 08/07/2015 0854   NITRITE NEGATIVE 12/12/2020 2052   LEUKOCYTESUR NEGATIVE 12/12/2020 2052    Radiological Exams on Admission: DG Chest 2 View  Result Date: 12/12/2020 CLINICAL DATA:  Cough and fever over the last 5 days. EXAM: CHEST - 2 VIEW COMPARISON:  06/14/2018 FINDINGS: Heart size is normal. Mediastinal shadows are normal. There is evidence of emphysema with lucency and pulmonary scarring. No sign of infiltrate, collapse or effusion. There may be central bronchial thickening, suggesting bronchitis. No significant bone finding. IMPRESSION: Emphysema. Possible bronchitis. No consolidation or collapse. Electronically Signed   By: Nelson Chimes M.D.   On: 12/12/2020 16:24   CT Head Wo Contrast  Result Date: 12/12/2020 CLINICAL DATA:  Malaise and weakness for 5 days, chronic left-sided weakness from prior stroke EXAM: CT HEAD WITHOUT CONTRAST TECHNIQUE: Contiguous axial images were obtained from the base of the skull through the vertex without intravenous contrast. COMPARISON:  CT angiography, MR and CT 06/14/2018 FINDINGS: Brain: There are remote appearing areas of encephalomalacia involving the right thalamus, occipital lobe and bilateral cerebellar hemispheres corresponding to areas of infarct seen on comparison MR and CT imaging. No new CT evident areas of gray-white differentiation loss, cortically based or large vascular territory infarct are evident on these images. No evidence of acute hemorrhage, hydrocephalus, extra-axial collection, visible mass lesion or mass effect. Symmetric prominence of the ventricles, cisterns and sulci compatible with parenchymal volume loss. Additional patchy and confluent areas of white matter hypoattenuation are most compatible with chronic microvascular angiopathy. Vascular: Atherosclerotic calcification of the carotid siphons and intradural vertebral arteries. No hyperdense vessel. Skull: No calvarial fracture or suspicious osseous  lesion. No scalp swelling or hematoma. Sinuses/Orbits: Paranasal sinuses and mastoid air cells are predominantly clear. Included orbital structures are unremarkable. None Other: None. IMPRESSION: 1. Remote appearing areas of encephalomalacia involving the right thalamus, occipital lobe and  bilateral cerebellar hemispheres corresponding to areas of infarct seen on comparison MR and CT imaging. 2. No clear acute areas of infarct on CT imaging. If there is persisting concern for acute/subacute infarction, MRI is more sensitive and specific for early features of ischemia. 3. Background of parenchymal volume loss and chronic microvascular angiopathy. 4. Intracranial atherosclerosis. Electronically Signed   By: Lovena Le M.D.   On: 12/12/2020 22:31    Assessment/Plan Principal Problem:   COVID-19 virus infection Active Problems:   Essential hypertension   Dyslipidemia   Weakness   AKI (acute kidney injury) ([place])   COVID-19 viral infection: Patient is presenting with complaints of fevers, chills, and cough.  He is vaccinated against Covid except booster shot.  SARS-CoV-2 PCR test positive.  Chest x-ray showing possible bronchitis but no infiltrate or consolidation.  He is febrile but remainder of vital signs stable.  Satting 98-100% on room air.  -Start remdesivir -Hold steroids given no hypoxemia. -Vitamin C, zinc, vitamin D -Mucinex DM -Tylenol as needed -Bronchodilator as needed -Check inflammatory markers including ferritin, fibrinogen, D-dimer, CRP, LDH -Check procalcitonin level -Check lactic acid level -Airborne and contact precautions -Incentive spirometry, flutter valve -Encourage prone positioning -Continuous pulse ox -Supplemental oxygen as needed to keep oxygen saturation above 90% -Blood culture x2 pending  Mild AKI/dehydration: Likely prerenal azotemia from dehydration/poor oral intake in the setting of acute viral illness and home ARB use. BUN 32, creatinine 1.9 (was 1.6 in  August 2021). -Gentle IV fluid hydration and repeat BMP in a.m. Avoid nephrotoxic agents, hold home losartan.  Monitor urine output.  Acute on chronic left-sided weakness: Patient has a history of prior stroke with residual left-sided weakness and spasticity.  He is complaining of worsening weakness for the past 4 to 5 days since his Covid symptoms started.  On exam, strength is 4 out of 5 in the left upper and lower extremities but no other neuro deficits.  Head CT negative for acute infarct.  His worsening weakness is likely related to his acute viral illness. -Brain MRI ordered to assess for possible acute stroke.  PT/OT eval, fall precautions.  History of embolic stroke -Continue home Coumadin, dosing per pharmacy.  Resume home statin after pharmacy med rec is done.  Hyperlipidemia -Resume home statin after pharmacy med rec is done.  Hypertension: Stable. -Resume home Coreg after pharmacy med rec is done.  Hold losartan at this time.  Chronic systolic heart failure: EF 20% on echo done 06/2018.  No signs of volume overload at this time. -Resume home beta-blocker after pharmacy med rec is done.  Hold ARB at this time.  Continue to monitor volume status.  Well-controlled noninsulin-dependent type 2 diabetes: Last A1c 6.6 in August 2021. -Repeat A1c.  Sliding scale insulin sensitive ACHS.  Hold Metformin.  CAD status post PCI: Stable.  Not endorsing any anginal symptoms at present. -Resume home beta-blocker and statin after pharmacy med rec is done.  DVT prophylaxis: Coumadin Code Status: Patient wishes to be full code. Family Communication: No family available at this time. Disposition Plan: Status is: Observation  The patient remains OBS appropriate and will d/c before 2 midnights.  Dispo: The patient is from: Home              Anticipated d/c is to: Home              Anticipated d/c date is: 2 days              Patient currently is  not medically stable to d/c.  The medical  decision making on this patient was of high complexity and the patient is at high risk for clinical deterioration, therefore this is a level 3 visit.  Shela Leff MD [facility]  If 7PM-7AM, please contact night-coverage [email or web address]  12/13/2020, 12:40 AM

## 2020-12-13 ENCOUNTER — Observation Stay (HOSPITAL_COMMUNITY): Payer: Medicare Other

## 2020-12-13 DIAGNOSIS — R262 Difficulty in walking, not elsewhere classified: Secondary | ICD-10-CM | POA: Diagnosis not present

## 2020-12-13 DIAGNOSIS — E785 Hyperlipidemia, unspecified: Secondary | ICD-10-CM | POA: Diagnosis present

## 2020-12-13 DIAGNOSIS — I2510 Atherosclerotic heart disease of native coronary artery without angina pectoris: Secondary | ICD-10-CM | POA: Diagnosis present

## 2020-12-13 DIAGNOSIS — J1282 Pneumonia due to coronavirus disease 2019: Secondary | ICD-10-CM | POA: Diagnosis present

## 2020-12-13 DIAGNOSIS — I10 Essential (primary) hypertension: Secondary | ICD-10-CM

## 2020-12-13 DIAGNOSIS — E1122 Type 2 diabetes mellitus with diabetic chronic kidney disease: Secondary | ICD-10-CM | POA: Diagnosis present

## 2020-12-13 DIAGNOSIS — U071 COVID-19: Secondary | ICD-10-CM | POA: Diagnosis present

## 2020-12-13 DIAGNOSIS — Z87891 Personal history of nicotine dependence: Secondary | ICD-10-CM | POA: Diagnosis not present

## 2020-12-13 DIAGNOSIS — N179 Acute kidney failure, unspecified: Secondary | ICD-10-CM | POA: Diagnosis present

## 2020-12-13 DIAGNOSIS — Z832 Family history of diseases of the blood and blood-forming organs and certain disorders involving the immune mechanism: Secondary | ICD-10-CM | POA: Diagnosis not present

## 2020-12-13 DIAGNOSIS — J439 Emphysema, unspecified: Secondary | ICD-10-CM | POA: Diagnosis present

## 2020-12-13 DIAGNOSIS — E860 Dehydration: Secondary | ICD-10-CM | POA: Diagnosis present

## 2020-12-13 DIAGNOSIS — Z79899 Other long term (current) drug therapy: Secondary | ICD-10-CM | POA: Diagnosis not present

## 2020-12-13 DIAGNOSIS — I5022 Chronic systolic (congestive) heart failure: Secondary | ICD-10-CM | POA: Diagnosis present

## 2020-12-13 DIAGNOSIS — Z7901 Long term (current) use of anticoagulants: Secondary | ICD-10-CM | POA: Diagnosis not present

## 2020-12-13 DIAGNOSIS — I130 Hypertensive heart and chronic kidney disease with heart failure and stage 1 through stage 4 chronic kidney disease, or unspecified chronic kidney disease: Secondary | ICD-10-CM | POA: Diagnosis present

## 2020-12-13 DIAGNOSIS — Z833 Family history of diabetes mellitus: Secondary | ICD-10-CM | POA: Diagnosis not present

## 2020-12-13 DIAGNOSIS — R531 Weakness: Secondary | ICD-10-CM | POA: Diagnosis not present

## 2020-12-13 DIAGNOSIS — J189 Pneumonia, unspecified organism: Secondary | ICD-10-CM | POA: Diagnosis not present

## 2020-12-13 DIAGNOSIS — N1831 Chronic kidney disease, stage 3a: Secondary | ICD-10-CM | POA: Diagnosis present

## 2020-12-13 DIAGNOSIS — Z9079 Acquired absence of other genital organ(s): Secondary | ICD-10-CM | POA: Diagnosis not present

## 2020-12-13 DIAGNOSIS — I69354 Hemiplegia and hemiparesis following cerebral infarction affecting left non-dominant side: Secondary | ICD-10-CM | POA: Diagnosis not present

## 2020-12-13 DIAGNOSIS — Z955 Presence of coronary angioplasty implant and graft: Secondary | ICD-10-CM | POA: Diagnosis not present

## 2020-12-13 DIAGNOSIS — Z7984 Long term (current) use of oral hypoglycemic drugs: Secondary | ICD-10-CM | POA: Diagnosis not present

## 2020-12-13 DIAGNOSIS — Z823 Family history of stroke: Secondary | ICD-10-CM | POA: Diagnosis not present

## 2020-12-13 DIAGNOSIS — I6782 Cerebral ischemia: Secondary | ICD-10-CM | POA: Diagnosis not present

## 2020-12-13 LAB — HIV ANTIBODY (ROUTINE TESTING W REFLEX): HIV Screen 4th Generation wRfx: NONREACTIVE

## 2020-12-13 LAB — CBG MONITORING, ED
Glucose-Capillary: 113 mg/dL — ABNORMAL HIGH (ref 70–99)
Glucose-Capillary: 117 mg/dL — ABNORMAL HIGH (ref 70–99)
Glucose-Capillary: 186 mg/dL — ABNORMAL HIGH (ref 70–99)
Glucose-Capillary: 104 mg/dL — ABNORMAL HIGH (ref 70–99)

## 2020-12-13 LAB — LACTATE DEHYDROGENASE: LDH: 198 U/L — ABNORMAL HIGH (ref 98–192)

## 2020-12-13 LAB — FERRITIN: Ferritin: 1135 ng/mL — ABNORMAL HIGH (ref 24–336)

## 2020-12-13 LAB — HEMOGLOBIN A1C
Hgb A1c MFr Bld: 6.4 % — ABNORMAL HIGH (ref 4.8–5.6)
Mean Plasma Glucose: 136.98 mg/dL

## 2020-12-13 LAB — FIBRINOGEN: Fibrinogen: 604 mg/dL — ABNORMAL HIGH (ref 210–475)

## 2020-12-13 LAB — LACTIC ACID, PLASMA: Lactic Acid, Venous: 0.9 mmol/L (ref 0.5–1.9)

## 2020-12-13 LAB — C-REACTIVE PROTEIN: CRP: 5.6 mg/dL — ABNORMAL HIGH (ref <1.0)

## 2020-12-13 LAB — PROTIME-INR
INR: 1.8 — ABNORMAL HIGH (ref 0.8–1.2)
Prothrombin Time: 20.2 seconds — ABNORMAL HIGH (ref 11.4–15.2)

## 2020-12-13 LAB — D-DIMER, QUANTITATIVE: D-Dimer, Quant: 0.77 ug/mL-FEU — ABNORMAL HIGH (ref 0.00–0.50)

## 2020-12-13 LAB — TRIGLYCERIDES: Triglycerides: 111 mg/dL (ref <150)

## 2020-12-13 LAB — PROCALCITONIN: Procalcitonin: 0.87 ng/mL

## 2020-12-13 LAB — GLUCOSE, CAPILLARY: Glucose-Capillary: 108 mg/dL — ABNORMAL HIGH (ref 70–99)

## 2020-12-13 MED ORDER — ACETAMINOPHEN 325 MG PO TABS
650.0000 mg | ORAL_TABLET | Freq: Four times a day (QID) | ORAL | Status: DC | PRN
  Administered 2020-12-13 (×2): 650 mg via ORAL
  Filled 2020-12-13 (×2): qty 2

## 2020-12-13 MED ORDER — VITAMIN D 25 MCG (1000 UNIT) PO TABS
1000.0000 [IU] | ORAL_TABLET | Freq: Every day | ORAL | Status: DC
  Administered 2020-12-13 – 2020-12-17 (×5): 1000 [IU] via ORAL
  Filled 2020-12-13 (×5): qty 1

## 2020-12-13 MED ORDER — SODIUM CHLORIDE 0.9 % IV SOLN
500.0000 mg | INTRAVENOUS | Status: DC
  Administered 2020-12-14: 500 mg via INTRAVENOUS
  Filled 2020-12-13: qty 500

## 2020-12-13 MED ORDER — INSULIN ASPART 100 UNIT/ML ~~LOC~~ SOLN
0.0000 [IU] | Freq: Three times a day (TID) | SUBCUTANEOUS | Status: DC
  Administered 2020-12-13: 2 [IU] via SUBCUTANEOUS
  Administered 2020-12-14: 1 [IU] via SUBCUTANEOUS
  Administered 2020-12-16: 2 [IU] via SUBCUTANEOUS
  Administered 2020-12-16: 1 [IU] via SUBCUTANEOUS
  Filled 2020-12-13: qty 0.09

## 2020-12-13 MED ORDER — SODIUM CHLORIDE 0.9 % IV SOLN
100.0000 mg | Freq: Every day | INTRAVENOUS | Status: AC
  Administered 2020-12-14 – 2020-12-17 (×4): 100 mg via INTRAVENOUS
  Filled 2020-12-13 (×4): qty 20

## 2020-12-13 MED ORDER — ALBUTEROL SULFATE HFA 108 (90 BASE) MCG/ACT IN AERS: 2.0000 | INHALATION_SPRAY | RESPIRATORY_TRACT | Status: DC | PRN

## 2020-12-13 MED ORDER — ZINC SULFATE 220 (50 ZN) MG PO CAPS
220.0000 mg | ORAL_CAPSULE | Freq: Every day | ORAL | Status: DC
  Administered 2020-12-13 – 2020-12-17 (×5): 220 mg via ORAL
  Filled 2020-12-13 (×5): qty 1

## 2020-12-13 MED ORDER — DM-GUAIFENESIN ER 30-600 MG PO TB12
1.0000 | ORAL_TABLET | Freq: Two times a day (BID) | ORAL | Status: DC
  Administered 2020-12-13 – 2020-12-14 (×4): 1 via ORAL
  Filled 2020-12-13 (×4): qty 1

## 2020-12-13 MED ORDER — SODIUM CHLORIDE 0.9 % IV SOLN
1.0000 g | INTRAVENOUS | Status: DC
  Administered 2020-12-14: 1 g via INTRAVENOUS
  Filled 2020-12-13: qty 10

## 2020-12-13 MED ORDER — INSULIN ASPART 100 UNIT/ML ~~LOC~~ SOLN
0.0000 [IU] | Freq: Every day | SUBCUTANEOUS | Status: DC
  Administered 2020-12-13: 0 [IU] via SUBCUTANEOUS
  Filled 2020-12-13: qty 0.05

## 2020-12-13 MED ORDER — SODIUM CHLORIDE 0.9 % IV SOLN: INTRAVENOUS | Status: AC

## 2020-12-13 MED ORDER — WARFARIN SODIUM 5 MG PO TABS
7.5000 mg | ORAL_TABLET | Freq: Once | ORAL | Status: AC
  Administered 2020-12-13: 7.5 mg via ORAL
  Filled 2020-12-13: qty 1

## 2020-12-13 MED ORDER — SODIUM CHLORIDE 0.9 % IV SOLN
200.0000 mg | Freq: Once | INTRAVENOUS | Status: AC
  Administered 2020-12-13: 200 mg via INTRAVENOUS
  Filled 2020-12-13: qty 200

## 2020-12-13 MED ORDER — CARVEDILOL 12.5 MG PO TABS
12.5000 mg | ORAL_TABLET | Freq: Two times a day (BID) | ORAL | Status: DC
  Administered 2020-12-14 – 2020-12-17 (×7): 12.5 mg via ORAL
  Filled 2020-12-13 (×7): qty 1

## 2020-12-13 MED ORDER — ASCORBIC ACID 500 MG PO TABS
500.0000 mg | ORAL_TABLET | Freq: Every day | ORAL | Status: DC
  Administered 2020-12-13 – 2020-12-17 (×5): 500 mg via ORAL
  Filled 2020-12-13 (×5): qty 1

## 2020-12-13 MED ORDER — WARFARIN - PHARMACIST DOSING INPATIENT: Freq: Every day | Status: DC

## 2020-12-13 NOTE — Progress Notes (Signed)
[place] for warfarin Indication: stroke  Allergies  Allergen Reactions  . Lipitor [Atorvastatin] Nausea And Vomiting    Patient Measurements: Height: _0  (188 cm) Weight: 68 kg (150 lb) IBW/kg (Calculated) : 82.2  Vital Signs: Temp: 101.3 F (38.5 C) (01/10 1958) Temp Source: Oral (01/10 1958) BP: 120/71 (01/10 1958) Pulse Rate: 75 (01/10 1958)  Labs: Recent Labs    12/12/20 2052 12/13/20 2036  HGB 12.4*  --   HCT 39.3  --   PLT 154  --   LABPROT  --  20.2*  INR  --  1.8*  CREATININE 1.93*  --     Estimated Creatinine Clearance: 36.2 mL/min (A) (by C-G formula based on SCr of 1.93 mg/dL (H)).   Medical History: Past Medical History:  Diagnosis Date  . Acute CVA (cerebrovascular accident) ([place]) 06/14/2018  . Coronary artery disease    blockage   Stent Dr. Lovena Le 15 years 1998  . Dyspnea   . Hypertension   . Stroke [facility]) 2019   denies residual on 06/14/2018  . Type II diabetes mellitus ([place]) 07/2014 dx    Medications:  Medications Prior to Admission  Medication Sig Dispense Refill Last Dose  . acetaminophen (TYLENOL) 325 MG tablet Take 1-2 tablets (325-650 mg total) by mouth every 4 (four) hours as needed for mild pain.   unk  . baclofen (LIORESAL) 10 MG tablet Take 1-2 tablets (10-20 mg total) by mouth 3 (three) times daily as needed for muscle spasms. 120 each 5 12/11/2020  . carvedilol (COREG) 12.5 MG tablet TAKE 1 TABLET BY MOUTH TWICE DAILY WITH MEALS (Patient taking differently: Take 12.5 mg by mouth 2 (two) times daily with a meal.) 180 tablet 1 12/11/2020  . losartan (COZAAR) 25 MG tablet Take 1 tablet by mouth once daily (Patient taking differently: Take 25 mg by mouth daily.) 90 tablet 1 12/11/2020  . metFORMIN (GLUCOPHAGE) 1000 MG tablet TAKE 1 TABLET BY MOUTH TWICE DAILY WITH A MEAL (Patient taking differently: Take 1,000 mg by mouth 2 (two) times daily with a meal.) 180 tablet 1 12/11/2020  . potassium chloride  (KLOR-CON) 10 MEQ tablet TAKE 1  BY MOUTH TWICE DAILY (Patient taking differently: Take 10 mEq by mouth 2 (two) times daily.) 180 tablet 0 12/11/2020  . rosuvastatin (CRESTOR) 20 MG tablet Take 1 tablet (20 mg total) by mouth daily. 90 tablet 3 12/11/2020  . warfarin (COUMADIN) 5 MG tablet TAKE 1 TO 1 & 1/2 (ONE & ONE-HALF) TABLETS BY MOUTH ONCE DAILY AS DIRECTED BY  COUMADIN  CLINIC (Patient taking differently: Take 5-7.5 mg by mouth See admin instructions. Take 1 tablet (36m) by mouth all days except on Monday and Friday take 1 &1/2 tablet (7.581m AS DIRECTED BY  COUMADIN  CLINIC) 35 tablet 2 12/11/2020  . blood glucose meter kit and supplies KIT Dispense based on patient and insurance preference. Use up to four times daily as directed. (FOR E11.9). 1 each 0   . Lancets 30G MISC Use to check blood sugars daily 100 each 3   . polyethylene glycol (MIRALAX / GLYCOLAX) packet Take 17 g by mouth 2 (two) times daily. (Patient not taking: No sig reported) 60 each 0 Not Taking at Unknown time   Scheduled:  . vitamin C  500 mg Oral Daily  . carvedilol  12.5 mg Oral BID WC  . cholecalciferol  1,000 Units Oral Daily  . dextromethorphan-guaiFENesin  1 tablet Oral BID  . insulin aspart  0-5 Units Subcutaneous QHS  . insulin aspart  0-9 Units Subcutaneous TID WC  . zinc sulfate  220 mg Oral Daily   Infusions:  . azithromycin    . cefTRIAXone (ROCEPHIN)  IV    . [START ON 12/14/2020] remdesivir 100 mg in NS 100 mL     Assessment: 60 yoM with PMH CVA, on warfarin PTA, HLD, HTN, sCHF, DM2, CAD s/p remote PCI   Baseline INR subtherapeutic  Prior anticoagulation: warfarin 7.5 mg Mon & Fri; 5 mg all other days; LD 12/12/19  Significant events:  Today, 12/13/2020:  CBC: Hgb slightly low; Plt WNL  INR SUBtherapeutic  Major drug interactions: none noted  No bleeding issues per nursing  Diet ordered  Goal of Therapy: INR 2-3  Plan:  Warfarin 7.5 mg PO tonight at 16:00  Daily INR  CBC at least q72  hr while on warfarin  Monitor for signs of bleeding or thrombosis   Reuel Boom, PharmD, BCPS [phone] 12/13/2020, 9:42 PM

## 2020-12-13 NOTE — Progress Notes (Signed)
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  Patient says that he has chronic left-sided weakness in the arm and leg since prior stroke.  For the past 4 to 5 days he was feeling more weak.  Normally he is able to ambulate with a walker but was having difficulty doing so.  Denied numbness vision changes.  He is vaccinated against COVID but did not have booster shot.  For the past few days he has been having fever, chills, cough and poor appetite. In the ED SARS-CoV-2 RT-PCR test was positive.  Influenza panel was negative.  Chest x-ray showed emphysema and possible bronchitis.  No consolidation or infiltrate noted.  Head CT was negative for acute infarct.  MRI brain obtained also was negative for acute infarct.   Subjective   Patient seen and examined, denies shortness of breath.   Assessment/Plan:     1. COVID-19 viral infection-patient presented with fever, chills and cough.  He is vaccinated against COVID-19, but did not get booster shot.  In the ED SARS-CoV-2 RT-PCR test was positive.  Chest x-ray showed possible bronchitis, no infiltrate or consolidation.  Patient started on remdesivir, vitamin C, zinc.  Mucinex DM as needed, as needed bronchodilator.  CRP elevated 5.6.  Continue airborne and contact precautions.  Incentive spirometry, flutter valve.  Patient is febrile with temperature 102.7, not requiring oxygen.  Procalcitonin is elevated at 0.87.  Will obtain blood cultures x2, start ceftriaxone and Zithromax. 2. Mild AKI-likely prerenal azotemia, patient was started on gentle IV hydration, check BMP in  am. 3. Acute on chronic left-sided weakness-head CT obtained was negative, MRI brain obtained shows no acute abnormality.  Home Home health PT recommended. 4. History of embolic stroke-resume Coumadin 5. Hypertension-continue Coreg.  Losartan is on hold due to renal insufficiency. 6. Chronic systolic heart failure-EF 20% as per echo from 2019.  No signs of volume overload.  Will resume Coreg 12.5 mg p.o. twice daily.    COVID-19 Labs  Recent Labs    12/13/20 0048  DDIMER 0.77*  FERRITIN 1,135*  LDH 198*  CRP 5.6*    Lab Results  Component Value Date   SARSCOV2NAA POSITIVE (A) 12/12/2020     Scheduled medications:   . vitamin C  500 mg Oral Daily  . cholecalciferol  1,000 Units Oral Daily  . dextromethorphan-guaiFENesin  1 tablet Oral BID  . insulin aspart  0-5 Units Subcutaneous QHS  . insulin aspart  0-9 Units Subcutaneous TID WC  . zinc sulfate  220 mg Oral Daily         CBG: Recent Labs  Lab 12/13/20 0104 12/13/20 0812 12/13/20 1246 12/13/20 1708  GLUCAP 113* 117* 186* 104*    SpO2: 94 %    CBC: Recent Labs  Lab 12/12/20 2052  WBC 3.6*  NEUTROABS 2.2  HGB 12.4*  HCT 39.3  MCV 82.2  PLT 154    Basic Metabolic Panel: Recent Labs  Lab 12/12/20 2052  NA 134*  K 4.2  CL 99  CO2 24  GLUCOSE 165*  BUN 32*  CREATININE 1.93*  CALCIUM 9.4     Liver Function Tests:  Recent Labs  Lab 12/12/20 2052  AST 63*  ALT 43  ALKPHOS 51  BILITOT 0.8  PROT 8.1  ALBUMIN 3.8     Antibiotics: Anti-infectives (From admission, onward)   Start     Dose/Rate Route Frequency Ordered Stop   12/14/20 1000  remdesivir 100 mg in sodium chloride 0.9 % 100 mL IVPB       "Followed by" Linked Group Details   100 mg 200 mL/hr over 30 Minutes Intravenous Daily 12/13/20 0042 12/18/20 0959   12/13/20 0130  remdesivir 200 mg in sodium chloride 0.9% 250 mL IVPB       "Followed by" Linked Group Details   200 mg 580 mL/hr over 30 Minutes Intravenous Once  12/13/20 0042 12/13/20 0614       DVT prophylaxis: Coumadin  Code Status: Full code  Family Communication: No family at bedside   Consultants:    Procedures:      Objective   Vitals:   12/13/20 1300 12/13/20 1400 12/13/20 1530 12/13/20 1600  BP: 111/63 112/65 112/65 134/85  Pulse: 82 82 82 80  Resp: 16 16 16 16   Temp:      TempSrc:      SpO2: 94% 94% 94% 94%  Weight:      Height:        Intake/Output Summary (Last 24 hours) at 12/13/2020 1724 Last data filed at 12/13/2020 1249 Gross per 24 hour  Intake 1000 ml  Output --  Net 1000 ml    No intake/output data recorded.  Filed Weights   12/12/20 1533  Weight: 68 kg    Physical Examination:   General-appears in no acute distress Heart-S1-S2, regular, no murmur auscultated Lungs-clear to auscultation bilaterally, no wheezing or crackles auscultated Abdomen-soft, nontender, no organomegaly Extremities-no edema in the lower extremities Neuro-alert, oriented x3, no focal deficit noted  Status is: Inpatient  Dispo: The patient is from: Home              Anticipated d/c is to: Home              Anticipated d/c date is: 12/15/2020              Patient currently not medically stable for discharge  Barrier to discharge-COVID-19 infection, generalized weakness            Data Reviewed:   Recent Results (from the past 240 hour(s))  Resp Panel by RT-PCR (Flu A&B, Covid) Nasopharyngeal Swab     Status: Abnormal   Collection Time: 12/12/20 10:05 PM   Specimen: Nasopharyngeal Swab; Nasopharyngeal(NP) swabs in vial transport medium  Result Value Ref Range Status   SARS Coronavirus 2 by RT PCR POSITIVE (A) NEGATIVE Final    Comment: CRITICAL RESULT CALLED TO, READ BACK BY AND VERIFIED WITH: 02/09/21 RN 12/12/2020 @2310  BY P.HENDERSON (NOTE) SARS-CoV-2 target nucleic acids are DETECTED.  The SARS-CoV-2 RNA is generally detectable in upper respiratory specimens during the acute phase of  infection. Positive results are indicative of the presence of the identified virus, but do not rule out bacterial infection or co-infection with other pathogens not detected by the test. Clinical correlation with patient history and other diagnostic information is necessary to determine patient infection status. The expected result is Negative.  Fact Sheet for Patients: 02/09/2021  Fact Sheet for Healthcare Providers:  This test is not yet approved or cleared by the [employer] FDA and  has been authorized for detection and/or diagnosis of SARS-CoV-2 by FDA  under an Emergency Use Authorization (EUA).  This EUA will remain in effect  (meaning this test can be used) for the duration of  the COVID-19 declaration under Section 564(b)(1) of the Act, 21 U.S.C. section 360bbb-3(b)(1), unless the authorization is terminated or revoked sooner.     Influenza A by PCR NEGATIVE NEGATIVE Final   Influenza B by PCR NEGATIVE NEGATIVE Final    Comment: (NOTE) The Xpert Xpress SARS-CoV-2/FLU/RSV plus assay is intended as an aid in the diagnosis of influenza from Nasopharyngeal swab specimens and should not be used as a sole basis for treatment. Nasal washings and aspirates are unacceptable for Xpert Xpress SARS-CoV-2/FLU/RSV testing.  Fact Sheet for Patients: [employer]  Fact Sheet for Healthcare Providers: [employer]ker[employer]it  This test is not yet approved or cleared by the [place] FDA and has been authorized for detection and/or diagnosis of SARS-CoV-2 by FDA under an Emergency Use Authorization (EUA). This EUA will remain in effect (meaning this test can be used) for the duration of the COVID-19 declaration under Section 564(b)(1) of the Act, 21 U.S.C. section 360bbb-3(b)(1), unless the authorization is terminated or revoked.  Performed at [facility], [address], [place], [place] [address]      Studies:  DG Chest 2 View  Result Date: 12/12/2020 CLINICAL DATA:  Cough and fever over the last 5 days. EXAM: CHEST - 2 VIEW COMPARISON:  06/14/2018 FINDINGS: Heart size is normal. Mediastinal shadows are normal. There is evidence of emphysema with lucency and pulmonary scarring. No sign of infiltrate, collapse or effusion. There may be central bronchial thickening, suggesting bronchitis. No significant bone finding. IMPRESSION: Emphysema. Possible bronchitis. No consolidation or collapse. Electronically Signed   By: Paulina Fusi M.D.   On: 12/12/2020 16:24   CT Head Wo Contrast  Result Date: 12/12/2020 CLINICAL DATA:  Malaise and weakness for 5 days, chronic left-sided weakness from prior stroke EXAM: CT HEAD WITHOUT CONTRAST TECHNIQUE: Contiguous axial images were obtained from the base of the skull through the vertex without intravenous contrast. COMPARISON:  CT angiography, MR and CT 06/14/2018 FINDINGS: Brain: There are remote appearing areas of encephalomalacia involving the right thalamus, occipital lobe and bilateral cerebellar hemispheres corresponding to areas of infarct seen on comparison MR and CT imaging. No new CT evident areas of gray-white differentiation loss, cortically based or large vascular territory infarct are evident on these images. No evidence of acute hemorrhage, hydrocephalus, extra-axial collection, visible mass lesion or mass effect. Symmetric prominence of the ventricles, cisterns and sulci compatible with parenchymal volume loss. Additional patchy and confluent areas of white matter hypoattenuation are most compatible with chronic microvascular angiopathy. Vascular: Atherosclerotic calcification of the carotid siphons and intradural vertebral arteries. No hyperdense vessel. Skull: No calvarial fracture or suspicious osseous lesion. No scalp swelling or hematoma. Sinuses/Orbits: Paranasal sinuses and  mastoid air cells are predominantly clear. Included orbital structures are unremarkable. None Other: None. IMPRESSION: 1. Remote appearing areas of encephalomalacia involving the right thalamus, occipital lobe and bilateral cerebellar hemispheres corresponding to areas of infarct seen on comparison MR and CT imaging. 2. No clear acute areas of infarct on CT imaging. If there is persisting concern for acute/subacute infarction, MRI is more sensitive and specific for early features of ischemia. 3. Background of parenchymal volume loss and chronic microvascular angiopathy. 4. Intracranial atherosclerosis. Electronically Signed   By: Kreg Shropshire M.D.   On: 12/12/2020 22:31   MR BRAIN WO CONTRAST  Result Date: 12/13/2020 CLINICAL DATA:  Weakness over  the last 6 days. EXAM: MRI HEAD WITHOUT CONTRAST TECHNIQUE: Multiplanar, multiecho pulse sequences of the brain and surrounding structures were obtained without intravenous contrast. COMPARISON:  06/14/2018 FINDINGS: Brain: Diffusion imaging does not show any acute or subacute infarction. There are old small vessel infarctions within both cerebellar hemispheres. No focal brainstem insult. Cerebral hemispheres show old infarction in the right thalamus. There is an old infarction in the right PCA territory affecting the posteromedial temporal lobe and occipital lobe. The study does not show chronic small-vessel ischemic changes of the white matter. No hydrocephalus or extra-axial collection. Vascular: Major vessels at the base of the brain show flow. Skull and upper cervical spine: Negative Sinuses/Orbits: Clear/normal Other: None IMPRESSION: No acute or reversible finding. Old small vessel infarctions within both cerebellar hemispheres. Old infarction in the right PCA territory affecting the posteromedial right temporal lobe and occipital lobe and the right thalamus. Electronically Signed   By: Paulina Fusi M.D.   On: 12/13/2020 15:22       Adaly Puder S Clorissa Gruenberg   [facility] If 7PM-7AM, please contact night-coverage at [email or web address], Office  [phone]   12/13/2020, 5:24 PM  LOS: 0 days

## 2020-12-13 NOTE — Evaluation (Signed)
Physical Therapy Evaluation Patient Details Name: Karl [name] MRN: [id number] DOB: [birth year] Today's Date: 12/13/2020   History of Present Illness  Pt is a 65 year old male with h/o CVA and residual LT sided weakness and numbness who is admitted with increased weakness and COVID-19.  Clinical Impression  The patient appears to not be at his baseline for ambulation per his report, using Rollator mod Independennt at home.   If patient has 24/7 caregivers, he should progress to DC home. Patient's SPO2 96% on Ra post ambulation. Pt admitted with above diagnosis.   Pt currently with functional limitations due to the deficits listed below (see PT Problem List). Pt will benefit from skilled PT to increase their independence and safety with mobility to allow discharge to the venue listed below.       Follow Up Recommendations Home health PT vs  SNf if does not have 24/7.    Equipment Recommendations  None recommended by PT    Recommendations for Other Services       Precautions / Restrictions Precautions Precautions: Fall Precaution Comments: Airborn/Contact Restrictions Weight Bearing Restrictions: No      Mobility  Bed Mobility Overal bed mobility: Needs Assistance Bed Mobility: Supine to Sit;Sit to Supine     Supine to sit: Supervision Sit to supine: Supervision   General bed mobility comments: Pt transitioned to EOB with one hip very close to EOB and need of supervision for safety. Noted jerking of left U/LE's    Transfers Overall transfer level: Needs assistance Equipment used: Rolling walker (2 wheeled) Transfers: Sit to/from Stand Sit to Stand: Min guard         General transfer comment: Pt stood from elevated stretcher/bed to RW with [employer]. Pt ambulated with RW in room, unsteadily. Please refer to PT Evaluation for more details on gait.  Ambulation/Gait Ambulation/Gait assistance: Min assist Gait Distance (Feet): 30 Feet Assistive device: Rolling  walker (2 wheeled) Gait Pattern/deviations: Step-to pattern;Decreased step length - left Gait velocity: decr   General Gait Details: decreased step length, noted hyperextension of the knee with stance  Stairs            Wheelchair Mobility    Modified Rankin (Stroke Patients Only)       Balance Overall balance assessment: Needs assistance Sitting-balance support: No upper extremity supported Sitting balance-Leahy Scale: Fair     Standing balance support: Bilateral upper extremity supported Standing balance-Leahy Scale: Poor Standing balance comment: Reliant on BUE support with RW.                             Pertinent Vitals/Pain Pain Assessment: No/denies pain    Home Living Family/patient expects to be discharged to:: Private residence Living Arrangements: Spouse/significant other;Children Available Help at Discharge: Family;Available 24 hours/day Type of Home: House Home Access: Stairs to enter   [employer] of Steps: 1 Home Layout: One level Home Equipment: Walker - 2 wheels;Walker - 4 wheels;Shower seat;Cane - single point;Bedside commode;Wheelchair - manual      Prior Function Level of Independence: Independent with assistive device(s)         Comments: Pt reports that he typically ambulates with his Rollator or cane Mod I. Pt denies falls.     Hand Dominance   Dominant Hand: Right    Extremity/Trunk Assessment   Upper Extremity Assessment Upper Extremity Assessment: RUE deficits/detail;LUE deficits/detail RUE Deficits / Details: WNL LUE Deficits / Details: AROM: WFL,  asymmetrical with RUE. MMT: Shoulder 3+/4, elbow ~4-/5, grip: 3+/5 LUE Sensation: decreased light touch LUE Coordination: decreased fine motor    Lower Extremity Assessment Lower Extremity Assessment: LLE deficits/detail LLE Deficits / Details: increased tone, has gross active movement, noted knee hyperextension at  stance LLE Sensation: decreased light  touch    Cervical / Trunk Assessment Cervical / Trunk Assessment: Normal  Communication   Communication: No difficulties  Cognition Arousal/Alertness: Awake/alert Behavior During Therapy: WFL for tasks assessed/performed Overall Cognitive Status: Within Functional Limits for tasks assessed                                        General Comments      Exercises     Assessment/Plan    PT Assessment Patient needs continued PT services  PT Problem List Decreased strength;Decreased balance;Decreased knowledge of precautions;Decreased activity tolerance;Decreased mobility;Impaired sensation       PT Treatment Interventions DME instruction;Therapeutic activities;Gait training;Therapeutic exercise;Patient/family education;Functional mobility training;Balance training    PT Goals (Current goals can be found in the Care Plan section)  Acute Rehab PT Goals Patient Stated Goal: To regain strength PT Goal Formulation: With patient Time For Goal Achievement: 12/27/20 Potential to Achieve Goals: Good    Frequency Min 3X/week   Barriers to discharge        Co-evaluation PT/OT/SLP Co-Evaluation/Treatment: Yes Reason for Co-Treatment: For patient/therapist safety;Complexity of the patient's impairments (multi-system involvement) PT goals addressed during session: Mobility/safety with mobility OT goals addressed during session: ADL's and self-care       AM-PAC PT "6 Clicks" Mobility  Outcome Measure Help needed turning from your back to your side while in a flat bed without using bedrails?: None Help needed moving from lying on your back to sitting on the side of a flat bed without using bedrails?: None Help needed moving to and from a bed to a chair (including a wheelchair)?: A Little Help needed standing up from a chair using your arms (e.g., wheelchair or bedside chair)?: A Little Help needed to walk in hospital room?: A Little Help needed climbing 3-5 steps  with a railing? : A Lot 6 Click Score: 19    End of Session   Activity Tolerance: Patient tolerated treatment well Patient left: in bed;with call bell/phone within reach Nurse Communication: Mobility status PT Visit Diagnosis: Unsteadiness on feet (R26.81);Other symptoms and signs involving the nervous system (R29.898)    Time: 1287-8676 PT Time Calculation (min) (ACUTE ONLY): 25 min   Charges:   PT Evaluation $PT Eval Low Complexity: 1 Low          Blanchard Kelch PT Acute Rehabilitation Services Pager [phone] Office [phone]   Rada Hay 12/13/2020, 1:13 PM

## 2020-12-13 NOTE — Evaluation (Signed)
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   Clinical Impression   Patient is currently requiring assistance with ADLs including moderate assist with toileting, and with LE dressing, minimal assist with bathing, and grooming and full setup with feeding, all of which is below patient's typical baseline of being Modified independent.  During this evaluation, patient was limited by exacerbated weakness particularly to LT side but also generalized and decreased activity tolerance, which has the potential to impact patient's safety and independence during functional mobility, as well as performance for ADLs. [employer] AM-PAC "6-clicks" Daily Activity Inpatient Short Form score of 16/24 indicates 53.32% ADL impairment this session. Patient lives with his family, who is able to provide PRN supervision and assistance.  Patient demonstrates good rehab potential, and should benefit from continued skilled occupational therapy services while in acute care to maximize safety, independence and quality of life at home.  Continued occupational therapy services in the home is recommended provided pt's family remains in good health to care for the patient while he recovers.  If family is unable to assistance, will need to consider SNF.  ?    Follow Up Recommendations  Home health OT    Equipment Recommendations       Recommendations for Other Services       Precautions / Restrictions Precautions Precautions: Fall Precaution Comments: Airborn/Contact Restrictions Weight Bearing Restrictions: No      Mobility Bed Mobility Overal bed mobility: Needs Assistance Bed Mobility: Supine to Sit;Sit to Supine     Supine to sit: Supervision Sit to supine:  Supervision   General bed mobility comments: Pt transitioned to EOB with one hip very close to EOB and need of supervision for safety.    Transfers Overall transfer level: Needs assistance Equipment used: Rolling walker (2 wheeled) Transfers: Sit to/from Stand Sit to Stand: Min guard         General transfer comment: Pt stood from elevated stretcher/bed to RW with [employer]. Pt ambulated with RW in room, unsteadily. Please refer to PT Evaluation for more details on gait.    Balance Overall balance assessment: Needs assistance Sitting-balance support: No upper extremity supported Sitting balance-Leahy Scale: Fair     Standing balance support: Bilateral upper extremity supported Standing balance-Leahy Scale: Poor Standing balance comment: Reliant on BUE support with RW.                           ADL either performed or assessed with clinical judgement   ADL Overall ADL's : Needs assistance/impaired Eating/Feeding: Set up Eating/Feeding Details (indicate cue type and reason): Pt required complete setup after attempting to open containers on his own with cues to stabilize items with LT hand. Grooming: Set up;Sitting;Bed level;Minimal assistance   Upper Body Bathing: Min guard;Sitting;Set up   Lower Body Bathing: Minimal assistance;Sitting/lateral leans;Sit to/from stand   Upper Body Dressing : Minimal assistance;Set up;Sitting Upper Body Dressing Details (indicate cue type and reason): Would require assistance for buttons/zippers. Lower Body Dressing: Moderate assistance;Sit to/from stand;Sitting/lateral leans Lower Body Dressing Details (indicate cue type and reason): Moderate assist required to fulling doff and don pants over hips.     Toileting- Clothing Manipulation and Hygiene: Moderate assistance;Sitting/lateral lean;Sit to/from stand  Functional mobility during ADLs: Minimal assistance;Rolling walker       Vision   Vision Assessment?: No  apparent visual deficits     Perception     Praxis      Pertinent Vitals/Pain Pain Assessment: No/denies pain     Hand Dominance Right   Extremity/Trunk Assessment Upper Extremity Assessment Upper Extremity Assessment: RUE deficits/detail;LUE deficits/detail RUE Deficits / Details: WNL LUE Deficits / Details: AROM: WFL, asymmetrical with RUE. MMT: Shoulder 3+/4, elbow ~4-/5, grip: 3+/5 LUE Sensation: decreased light touch LUE Coordination: decreased fine motor   Lower Extremity Assessment Lower Extremity Assessment: Defer to PT evaluation   Cervical / Trunk Assessment Cervical / Trunk Assessment: Normal (Needs cues for upright posture during ambulation.)   Communication Communication Communication: No difficulties   Cognition Arousal/Alertness: Awake/alert Behavior During Therapy: WFL for tasks assessed/performed Overall Cognitive Status: Within Functional Limits for tasks assessed                                     General Comments       Exercises     Shoulder Instructions      Home Living Family/patient expects to be discharged to:: Private residence Living Arrangements: Spouse/significant other;Children Available Help at Discharge: Family;Available 24 hours/day Type of Home: House Home Access: Stairs to enter [employer] of Steps: 1   Home Layout: One level     Bathroom Shower/Tub: Chief Strategy Officer: Handicapped height Bathroom Accessibility: Yes How Accessible: Accessible via walker Home Equipment: Walker - 2 wheels;Walker - 4 wheels;Shower seat;Cane - single point;Bedside commode;Wheelchair - manual          Prior Functioning/Environment Level of Independence: Independent with assistive device(s)        Comments: Pt reports that he typically ambulates with his Rollator or cane Mod I. Pt denies falls.        OT Problem List: Decreased strength;Decreased coordination;Cardiopulmonary status limiting  activity;Decreased activity tolerance;Impaired tone;Impaired balance (sitting and/or standing);Impaired UE functional use      OT Treatment/Interventions: Self-care/ADL training;Therapeutic exercise;Therapeutic activities;Neuromuscular education;Energy conservation;DME and/or AE instruction;Patient/family education;Balance training    OT Goals(Current goals can be found in the care plan section) Acute Rehab OT Goals Patient Stated Goal: To regain strength OT Goal Formulation: With patient Time For Goal Achievement: 12/27/20 Potential to Achieve Goals: Good ADL Goals Pt Will Perform Grooming: with supervision;standing;with set-up Pt Will Perform Lower Body Dressing: with set-up;sitting/lateral leans;sit to/from stand;with supervision Pt Will Transfer to Toilet: ambulating;grab bars;with supervision Pt Will Perform Toileting - Clothing Manipulation and hygiene: with modified independence;sit to/from stand;sitting/lateral leans Additional ADL Goal #1: Pt will engage in an 8+ min out of bed functional activity with SpO2 at or over 90% and RPE no greater than 5/10 in order to demonstrate improved activity tolerance needed to resume ADLs at home.  OT Frequency: Min 2X/week   Barriers to D/C:    None known       Co-evaluation PT/OT/SLP Co-Evaluation/Treatment: Yes Reason for Co-Treatment: For patient/therapist safety;Complexity of the patient's impairments (multi-system involvement) PT goals addressed during session: Mobility/safety with mobility OT goals addressed during session: ADL's and self-care      AM-PAC OT "6 Clicks" Daily Activity     Outcome Measure Help from another person eating meals?: A Little Help from another person taking care of personal grooming?: A Little Help from another person toileting, which includes using toliet, bedpan, or urinal?:  A Lot Help from another person bathing (including washing, rinsing, drying)?: A Little Help from another person to put on and  taking off regular upper body clothing?: A Little Help from another person to put on and taking off regular lower body clothing?: A Lot 6 Click Score: 16   End of Session Equipment Utilized During Treatment: Rolling walker  Activity Tolerance: Patient tolerated treatment well Patient left: in bed;with call bell/phone within reach  OT Visit Diagnosis: Unsteadiness on feet (R26.81);Hemiplegia and hemiparesis Hemiplegia - Right/Left: Left Hemiplegia - dominant/non-dominant: Non-Dominant Hemiplegia - caused by: Cerebral infarction                Time: 5277-8242 OT Time Calculation (min): 21 min Charges:  OT General Charges $OT Visit: 1 Visit OT Evaluation $OT Eval Low Complexity: 1 Low  Mazen Marcin, OT Acute Rehab Services Office: [phone] 12/13/2020   Theodoro Clock 12/13/2020, 11:06 AM

## 2020-12-14 DIAGNOSIS — I10 Essential (primary) hypertension: Secondary | ICD-10-CM | POA: Diagnosis not present

## 2020-12-14 DIAGNOSIS — N179 Acute kidney failure, unspecified: Secondary | ICD-10-CM | POA: Diagnosis not present

## 2020-12-14 DIAGNOSIS — R262 Difficulty in walking, not elsewhere classified: Secondary | ICD-10-CM | POA: Diagnosis not present

## 2020-12-14 DIAGNOSIS — J189 Pneumonia, unspecified organism: Secondary | ICD-10-CM

## 2020-12-14 DIAGNOSIS — U071 COVID-19: Secondary | ICD-10-CM | POA: Diagnosis not present

## 2020-12-14 LAB — CBC
HCT: 34.7 % — ABNORMAL LOW (ref 39.0–52.0)
Hemoglobin: 10.8 g/dL — ABNORMAL LOW (ref 13.0–17.0)
MCH: 25.8 pg — ABNORMAL LOW (ref 26.0–34.0)
MCHC: 31.1 g/dL (ref 30.0–36.0)
MCV: 82.8 fL (ref 80.0–100.0)
Platelets: 138 10*3/uL — ABNORMAL LOW (ref 150–400)
RBC: 4.19 MIL/uL — ABNORMAL LOW (ref 4.22–5.81)
RDW: 14.3 % (ref 11.5–15.5)
WBC: 2.2 10*3/uL — ABNORMAL LOW (ref 4.0–10.5)
nRBC: 0 % (ref 0.0–0.2)

## 2020-12-14 LAB — PROTIME-INR
INR: 2.2 — ABNORMAL HIGH (ref 0.8–1.2)
Prothrombin Time: 23.5 seconds — ABNORMAL HIGH (ref 11.4–15.2)

## 2020-12-14 LAB — GLUCOSE, CAPILLARY
Glucose-Capillary: 96 mg/dL (ref 70–99)
Glucose-Capillary: 132 mg/dL — ABNORMAL HIGH (ref 70–99)
Glucose-Capillary: 111 mg/dL — ABNORMAL HIGH (ref 70–99)
Glucose-Capillary: 110 mg/dL — ABNORMAL HIGH (ref 70–99)

## 2020-12-14 LAB — BASIC METABOLIC PANEL
Anion gap: 8 (ref 5–15)
BUN: 27 mg/dL — ABNORMAL HIGH (ref 8–23)
CO2: 26 mmol/L (ref 22–32)
Calcium: 8.9 mg/dL (ref 8.9–10.3)
Chloride: 102 mmol/L (ref 98–111)
Creatinine, Ser: 1.58 mg/dL — ABNORMAL HIGH (ref 0.61–1.24)
GFR, Estimated: 48 mL/min — ABNORMAL LOW (ref >60)
Glucose, Bld: 108 mg/dL — ABNORMAL HIGH (ref 70–99)
Potassium: 3.9 mmol/L (ref 3.5–5.1)
Sodium: 136 mmol/L (ref 135–145)

## 2020-12-14 MED ORDER — GUAIFENESIN ER 600 MG PO TB12
1200.0000 mg | ORAL_TABLET | Freq: Two times a day (BID) | ORAL | Status: DC
  Administered 2020-12-14 – 2020-12-17 (×6): 1200 mg via ORAL
  Filled 2020-12-14 (×6): qty 2

## 2020-12-14 MED ORDER — WARFARIN SODIUM 5 MG PO TABS
5.0000 mg | ORAL_TABLET | Freq: Once | ORAL | Status: AC
  Administered 2020-12-14: 5 mg via ORAL
  Filled 2020-12-14: qty 1

## 2020-12-14 NOTE — Plan of Care (Deleted)
  Problem: Education: Goal: Knowledge of General Education information will improve Description: Including pain rating scale, medication(s)/side effects and non-pharmacologic comfort measures Outcome: Progressing   Problem: Health Behavior/Discharge Planning: Goal: Ability to manage health-related needs will improve Outcome: Progressing   Problem: Clinical Measurements: Goal: Ability to maintain clinical measurements within normal limits will improve Outcome: Progressing Goal: Will remain free from infection Outcome: Progressing Goal: Diagnostic test results will improve Outcome: Progressing Goal: Respiratory complications will improve Outcome: Progressing Goal: Cardiovascular complication will be avoided Outcome: Progressing   Problem: Activity: Goal: Risk for activity intolerance will decrease Outcome: Progressing   Problem: Nutrition: Goal: Adequate nutrition will be maintained Outcome: Progressing   Problem: Coping: Goal: Level of anxiety will decrease Outcome: Progressing   Problem: Safety: Goal: Ability to remain free from injury will improve Outcome: Progressing   Problem: Skin Integrity: Goal: Risk for impaired skin integrity will decrease Outcome: Progressing   Problem: Education: Goal: Knowledge of risk factors and measures for prevention of condition will improve Outcome: Progressing   Problem: Coping: Goal: Psychosocial and spiritual needs will be supported Outcome: Progressing   Problem: Respiratory: Goal: Will maintain a patent airway Outcome: Progressing Goal: Complications related to the disease process, condition or treatment will be avoided or minimized Outcome: Progressing   

## 2020-12-14 NOTE — Progress Notes (Signed)
[facility] for warfarin Indication: stroke  Allergies  Allergen Reactions  . Lipitor [Atorvastatin] Nausea And Vomiting    Patient Measurements: Height: _0  (188 cm) Weight: 68 kg (150 lb) IBW/kg (Calculated) : 82.2  Vital Signs: Temp: 99.3 F (37.4 C) (01/11 0405) Temp Source: Oral (01/11 0405) BP: 119/73 (01/11 0833) Pulse Rate: 80 (01/11 0833)  Labs: Recent Labs    12/12/20 2052 12/13/20 2036 12/14/20 0320  HGB 12.4*  --  10.8*  HCT 39.3  --  34.7*  PLT 154  --  138*  LABPROT  --  20.2* 23.5*  INR  --  1.8* 2.2*  CREATININE 1.93*  --  1.58*    Estimated Creatinine Clearance: 44.2 mL/min (A) (by C-G formula based on SCr of 1.58 mg/dL (H)).   Medical History: Past Medical History:  Diagnosis Date  . Acute CVA (cerebrovascular accident) ([place]) 06/14/2018  . Coronary artery disease    blockage   Stent Dr. Lovena Le 15 years 1998  . Dyspnea   . Hypertension   . Stroke [facility]) 2019   denies residual on 06/14/2018  . Type II diabetes mellitus ([place]) 07/2014 dx    Medications:  Medications Prior to Admission  Medication Sig Dispense Refill Last Dose  . acetaminophen (TYLENOL) 325 MG tablet Take 1-2 tablets (325-650 mg total) by mouth every 4 (four) hours as needed for mild pain.   unk  . baclofen (LIORESAL) 10 MG tablet Take 1-2 tablets (10-20 mg total) by mouth 3 (three) times daily as needed for muscle spasms. 120 each 5 12/11/2020  . carvedilol (COREG) 12.5 MG tablet TAKE 1 TABLET BY MOUTH TWICE DAILY WITH MEALS (Patient taking differently: Take 12.5 mg by mouth 2 (two) times daily with a meal.) 180 tablet 1 12/11/2020  . losartan (COZAAR) 25 MG tablet Take 1 tablet by mouth once daily (Patient taking differently: Take 25 mg by mouth daily.) 90 tablet 1 12/11/2020  . metFORMIN (GLUCOPHAGE) 1000 MG tablet TAKE 1 TABLET BY MOUTH TWICE DAILY WITH A MEAL (Patient taking differently: Take 1,000 mg by mouth 2 (two) times daily with a meal.)  180 tablet 1 12/11/2020  . potassium chloride (KLOR-CON) 10 MEQ tablet TAKE 1  BY MOUTH TWICE DAILY (Patient taking differently: Take 10 mEq by mouth 2 (two) times daily.) 180 tablet 0 12/11/2020  . rosuvastatin (CRESTOR) 20 MG tablet Take 1 tablet (20 mg total) by mouth daily. 90 tablet 3 12/11/2020  . warfarin (COUMADIN) 5 MG tablet TAKE 1 TO 1 & 1/2 (ONE & ONE-HALF) TABLETS BY MOUTH ONCE DAILY AS DIRECTED BY  COUMADIN  CLINIC (Patient taking differently: Take 5-7.5 mg by mouth See admin instructions. Take 1 tablet (77m) by mouth all days except on Monday and Friday take 1 &1/2 tablet (7.555m AS DIRECTED BY  COUMADIN  CLINIC) 35 tablet 2 12/11/2020  . blood glucose meter kit and supplies KIT Dispense based on patient and insurance preference. Use up to four times daily as directed. (FOR E11.9). 1 each 0   . Lancets 30G MISC Use to check blood sugars daily 100 each 3   . polyethylene glycol (MIRALAX / GLYCOLAX) packet Take 17 g by mouth 2 (two) times daily. (Patient not taking: No sig reported) 60 each 0 Not Taking at Unknown time   Scheduled:  . vitamin C  500 mg Oral Daily  . carvedilol  12.5 mg Oral BID WC  . cholecalciferol  1,000 Units Oral Daily  . dextromethorphan-guaiFENesin  1 tablet Oral BID  . insulin aspart  0-5 Units Subcutaneous QHS  . insulin aspart  0-9 Units Subcutaneous TID WC  . Warfarin - Pharmacist Dosing Inpatient   Does not apply q1600  . zinc sulfate  220 mg Oral Daily   Infusions:  . azithromycin    . cefTRIAXone (ROCEPHIN)  IV    . remdesivir 100 mg in NS 100 mL     Assessment: 66 yoM with PMH CVA, on warfarin PTA, HLD, HTN, sCHF, DM2, CAD s/p remote PCI   Baseline INR subtherapeutic  Prior anticoagulation: warfarin 7.5 mg Mon & Fri; 5 mg all other days; LD 12/12/19  Significant events:  Today, 12/14/2020:  CBC: Hgb slightly low at 10.8; Plt low at 138, stable   INR 2.2, therapeutic  Major drug interactions: azithromycin added, may potentially increase INR    No bleeding issues noted   Diet ordered  Goal of Therapy: INR 2-3  Plan:  Warfarin 5 mg PO tonight at 16:00  Daily INR  CBC at least q72 hr while on warfarin  Monitor for signs of bleeding or thrombosis    Royetta Asal, PharmD, BCPS 12/14/2020 12:13 PM

## 2020-12-14 NOTE — Progress Notes (Addendum)
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  Patient says that he has chronic left-sided weakness in the arm and leg since prior stroke.  For the past 4 to 5 days he was feeling more weak.  Normally he is able to ambulate with a walker but was having difficulty doing so.  Denied numbness vision changes.  He is vaccinated against COVID but did not have booster shot.  For the past few days he has been having fever, chills, cough and poor appetite. In the ED SARS-CoV-2 RT-PCR test was positive.  Influenza panel was negative.  Chest x-ray showed emphysema and possible bronchitis.  No consolidation or infiltrate noted.  Head CT was negative for acute infarct.  MRI brain obtained also was negative for acute infarct.   Subjective   Patient seen and examined, denies shortness of breath.  Has been coughing up phlegm.  Patient developed fever, with T-max 102 F.  He was empirically started on ceftriaxone and Zithromax.  Blood cultures x2 have been obtained.   Assessment/Plan:     1. COVID-19 viral infection-patient presented with fever, chills and cough.  He is vaccinated against COVID-19, but did not get booster shot.  In the ED SARS-CoV-2 RT-PCR test was positive.  Chest x-ray showed possible bronchitis, no infiltrate or consolidation.  Patient started on remdesivir, vitamin C, zinc.  Mucinex DM as needed, as needed bronchodilator.  CRP elevated 5.6.  Continue airborne and contact precautions.  Incentive spirometry, flutter valve.   2. Fever-Patient is febrile with Tmax102.7, not requiring oxygen.  Procalcitonin is  elevated at 0.87.   blood cultures x2 were obtained and patient started on ceftriaxone and Zithromax. 3. Mild AKI-likely prerenal azotemia, patient was started on gentle IV hydration, creatinine has improved to 1.58.  Likely his baseline.  IV fluids have been discontinued. 4. Diabetes mellitus type 2-continue sliding scale insulin with NovoLog.  CBG well controlled. 5. Acute on chronic left-sided weakness-head CT obtained was negative, MRI brain obtained shows no acute abnormality.  Home Home health PT recommended. 6. History of embolic stroke-continue Coumadin 7. Hypertension-continue Coreg.  Losartan is on hold due to renal insufficiency. 8. Chronic systolic heart failure-EF 20% as per echo from 2019.  No signs of volume overload.  Continue Coreg 12.5 mg p.o. twice daily.    COVID-19 Labs  Recent Labs    12/13/20 0048  DDIMER 0.77*  FERRITIN 1,135*  LDH 198*  CRP 5.6*    Lab Results  Component Value Date   SARSCOV2NAA POSITIVE (A) 12/12/2020     Scheduled medications:   . vitamin C  500 mg Oral Daily  . carvedilol  12.5 mg Oral BID WC  . cholecalciferol  1,000 Units Oral Daily  . dextromethorphan-guaiFENesin  1 tablet Oral BID  . insulin aspart  0-5 Units Subcutaneous QHS  . insulin aspart  0-9 Units Subcutaneous TID WC  . warfarin  5 mg Oral ONCE-1600  . Warfarin - Pharmacist Dosing Inpatient   Does not apply q1600  . zinc sulfate  220 mg Oral Daily         CBG: Recent Labs  Lab 12/13/20 1246  12/13/20 1708 12/13/20 2205 12/14/20 0818 12/14/20 1211  GLUCAP 186* 104* 108* 96 132*    SpO2: 97 %    CBC: Recent Labs  Lab 12/12/20 2052 12/14/20 0320  WBC 3.6* 2.2*  NEUTROABS 2.2  --   HGB 12.4* 10.8*  HCT 39.3 34.7*  MCV 82.2 82.8  PLT 154 138*    Basic Metabolic Panel: Recent Labs  Lab 12/12/20 2052 12/14/20 0320  NA 134* 136  K 4.2 3.9  CL 99 102  CO2 24 26  GLUCOSE 165* 108*  BUN 32* 27*  CREATININE 1.93* 1.58*  CALCIUM 9.4 8.9      Liver Function Tests: Recent Labs  Lab 12/12/20 2052  AST 63*  ALT 43  ALKPHOS 51  BILITOT 0.8  PROT 8.1  ALBUMIN 3.8     Antibiotics: Anti-infectives (From admission, onward)   Start     Dose/Rate Route Frequency Ordered Stop   12/14/20 1000  remdesivir 100 mg in sodium chloride 0.9 % 100 mL IVPB       "Followed by" Linked Group Details   100 mg 200 mL/hr over 30 Minutes Intravenous Daily 12/13/20 0042 12/18/20 0959   12/13/20 1800  cefTRIAXone (ROCEPHIN) 1 g in sodium chloride 0.9 % 100 mL IVPB        1 g 200 mL/hr over 30 Minutes Intravenous Every 24 hours 12/13/20 1731     12/13/20 1800  azithromycin (ZITHROMAX) 500 mg in sodium chloride 0.9 % 250 mL IVPB        500 mg 250 mL/hr over 60 Minutes Intravenous Every 24 hours 12/13/20 1731     12/13/20 0130  remdesivir 200 mg in sodium chloride 0.9% 250 mL IVPB       "Followed by" Linked Group Details   200 mg 580 mL/hr over 30 Minutes Intravenous Once 12/13/20 0042 12/13/20 0614       DVT prophylaxis: Coumadin  Code Status: Full code  Family Communication: No family at bedside   Consultants:    Procedures:      Objective   Vitals:   12/13/20 2202 12/13/20 2351 12/14/20 0405 12/14/20 0833  BP:  121/77 101/71 119/73  Pulse:  70 74 80  Resp:  20 18   Temp: 99.6 F (37.6 C) 99.6 F (37.6 C) 99.3 F (37.4 C)   TempSrc: Oral Oral Oral   SpO2:  99% 97%   Weight:      Height:       No intake or output data in the 24 hours ending 12/14/20 1502  01/09 1901 - 01/11 0700 In: 1000 [I.V.:1000] Out: -   Filed Weights   12/12/20 1533  Weight: 68 kg    Physical Examination:   General-appears in no acute distress Heart-S1-S2, regular, no murmur auscultated Lungs-bilateral rhonchi auscultated Abdomen-soft, nontender, no organomegaly Extremities-no edema in the lower extremities Neuro-alert, oriented x3, no focal deficit noted  Status is: Inpatient  Dispo: The patient is from: Home               Anticipated d/c is to: Home              Anticipated d/c date is: 12/16/2020              Patient currently not medically stable for discharge  Barrier to discharge-COVID-19 infection, community-acquired pneumonia            Data Reviewed:   Recent Results (from the past 240 hour(s))  Resp Panel by RT-PCR (Flu  A&B, Covid) Nasopharyngeal Swab     Status: Abnormal   Collection Time: 12/12/20 10:05 PM   Specimen: Nasopharyngeal Swab; Nasopharyngeal(NP) swabs in vial transport medium  Result Value Ref Range Status   SARS Coronavirus 2 by RT PCR POSITIVE (A) NEGATIVE Final    Comment: CRITICAL RESULT CALLED TO, READ BACK BY AND VERIFIED WITH: Jule SerQUINN KERSCHER RN 12/12/2020 @2310  BY P.HENDERSON (NOTE) SARS-CoV-2 target nucleic acids are DETECTED.  The SARS-CoV-2 RNA is generally detectable in upper respiratory specimens during the acute phase of infection. Positive results are indicative of the presence of the identified virus, but do not rule out bacterial infection or co-infection with other pathogens not detected by the test. Clinical correlation with patient history and other diagnostic information is necessary to determine patient infection status. The expected result is Negative.  Fact Sheet for Patients: BloggerCourse.comhttps://www.fda.gov/media/152166/download  Fact Sheet for Healthcare Providers: SeriousBroker.ithttps://www.fda.gov/media/152162/download  This test is not yet approved or cleared by the [place] FDA and  has been authorized for detection and/or diagnosis of SARS-CoV-2 by FDA under an Emergency Use Authorization (EUA).  This EUA will remain in effect  (meaning this test can be used) for the duration of  the COVID-19 declaration under Section 564(b)(1) of the Act, 21 U.S.C. section 360bbb-3(b)(1), unless the authorization is terminated or revoked sooner.     Influenza A by PCR NEGATIVE NEGATIVE Final   Influenza B by PCR NEGATIVE NEGATIVE Final    Comment: (NOTE) The  Xpert Xpress SARS-CoV-2/FLU/RSV plus assay is intended as an aid in the diagnosis of influenza from Nasopharyngeal swab specimens and should not be used as a sole basis for treatment. Nasal washings and aspirates are unacceptable for Xpert Xpress SARS-CoV-2/FLU/RSV testing.  Fact Sheet for Patients: BloggerCourse.comhttps://www.fda.gov/media/152166/download  Fact Sheet for Healthcare Providers: SeriousBroker.ithttps://www.fda.gov/media/152162/download  This test is not yet approved or cleared by the [place] FDA and has been authorized for detection and/or diagnosis of SARS-CoV-2 by FDA under an Emergency Use Authorization (EUA). This EUA will remain in effect (meaning this test can be used) for the duration of the COVID-19 declaration under Section 564(b)(1) of the Act, 21 U.S.C. section 360bbb-3(b)(1), unless the authorization is terminated or revoked.  Performed at [facility], [address] [place], [place] [address]   Culture, blood (Routine X 2) w Reflex to ID Panel     Status: None (Preliminary result)   Collection Time: 12/13/20  8:36 PM   Specimen: BLOOD  Result Value Ref Range Status   Specimen Description   Final    BLOOD RIGHT ARM Performed at [facility], [address] [place], [place] [address]    Special Requests   Final    BOTTLES DRAWN AEROBIC AND ANAEROBIC Blood Culture adequate volume Performed at [facility], [address] [place], [place] [address]    Culture   Final    NO GROWTH < 12 HOURS Performed at [facility], [address] [place], [place] [address]    Report Status PENDING  Incomplete  Culture, blood (Routine X 2) w Reflex to ID Panel     Status: None (Preliminary result)   Collection Time: 12/13/20  8:36 PM   Specimen: BLOOD  Result Value Ref Range Status   Specimen Description   Final    BLOOD LEFT ARM Performed at [facility], [address] [place], [place] [address]     Special Requests   Final    BOTTLES DRAWN AEROBIC  ONLY Blood Culture adequate volume Performed at [facility], [address] [place], [place] [address]    Culture   Final    NO GROWTH < 12 HOURS Performed at [facility], [address], [place], [place] [address]    Report Status PENDING  Incomplete     Studies:  DG Chest 2 View  Result Date: 12/12/2020 CLINICAL DATA:  Cough and fever over the last 5 days. EXAM: CHEST - 2 VIEW COMPARISON:  06/14/2018 FINDINGS: Heart size is normal. Mediastinal shadows are normal. There is evidence of emphysema with lucency and pulmonary scarring. No sign of infiltrate, collapse or effusion. There may be central bronchial thickening, suggesting bronchitis. No significant bone finding. IMPRESSION: Emphysema. Possible bronchitis. No consolidation or collapse. Electronically Signed   By: Paulina Fusi M.D.   On: 12/12/2020 16:24   CT Head Wo Contrast  Result Date: 12/12/2020 CLINICAL DATA:  Malaise and weakness for 5 days, chronic left-sided weakness from prior stroke EXAM: CT HEAD WITHOUT CONTRAST TECHNIQUE: Contiguous axial images were obtained from the base of the skull through the vertex without intravenous contrast. COMPARISON:  CT angiography, MR and CT 06/14/2018 FINDINGS: Brain: There are remote appearing areas of encephalomalacia involving the right thalamus, occipital lobe and bilateral cerebellar hemispheres corresponding to areas of infarct seen on comparison MR and CT imaging. No new CT evident areas of gray-white differentiation loss, cortically based or large vascular territory infarct are evident on these images. No evidence of acute hemorrhage, hydrocephalus, extra-axial collection, visible mass lesion or mass effect. Symmetric prominence of the ventricles, cisterns and sulci compatible with parenchymal volume loss. Additional patchy and confluent areas of white matter hypoattenuation are most compatible with chronic  microvascular angiopathy. Vascular: Atherosclerotic calcification of the carotid siphons and intradural vertebral arteries. No hyperdense vessel. Skull: No calvarial fracture or suspicious osseous lesion. No scalp swelling or hematoma. Sinuses/Orbits: Paranasal sinuses and mastoid air cells are predominantly clear. Included orbital structures are unremarkable. None Other: None. IMPRESSION: 1. Remote appearing areas of encephalomalacia involving the right thalamus, occipital lobe and bilateral cerebellar hemispheres corresponding to areas of infarct seen on comparison MR and CT imaging. 2. No clear acute areas of infarct on CT imaging. If there is persisting concern for acute/subacute infarction, MRI is more sensitive and specific for early features of ischemia. 3. Background of parenchymal volume loss and chronic microvascular angiopathy. 4. Intracranial atherosclerosis. Electronically Signed   By: Kreg Shropshire M.D.   On: 12/12/2020 22:31   MR BRAIN WO CONTRAST  Result Date: 12/13/2020 CLINICAL DATA:  Weakness over the last 6 days. EXAM: MRI HEAD WITHOUT CONTRAST TECHNIQUE: Multiplanar, multiecho pulse sequences of the brain and surrounding structures were obtained without intravenous contrast. COMPARISON:  06/14/2018 FINDINGS: Brain: Diffusion imaging does not show any acute or subacute infarction. There are old small vessel infarctions within both cerebellar hemispheres. No focal brainstem insult. Cerebral hemispheres show old infarction in the right thalamus. There is an old infarction in the right PCA territory affecting the posteromedial temporal lobe and occipital lobe. The study does not show chronic small-vessel ischemic changes of the white matter. No hydrocephalus or extra-axial collection. Vascular: Major vessels at the base of the brain show flow. Skull and upper cervical spine: Negative Sinuses/Orbits: Clear/normal Other: None IMPRESSION: No acute or reversible finding. Old small vessel infarctions  within both cerebellar hemispheres. Old infarction in the right PCA territory affecting the posteromedial right temporal lobe and occipital lobe and the right thalamus. Electronically  Signed   By: Paulina Fusi M.D.   On: 12/13/2020 15:22       Alexy Bringle S Mumin Denomme   [facility] If 7PM-7AM, please contact night-coverage at [email or web address], Office  [phone]   12/14/2020, 3:02 PM  LOS: 1 day

## 2020-12-14 NOTE — Plan of Care (Signed)

## 2020-12-15 DIAGNOSIS — I10 Essential (primary) hypertension: Secondary | ICD-10-CM | POA: Diagnosis not present

## 2020-12-15 DIAGNOSIS — J1282 Pneumonia due to coronavirus disease 2019: Secondary | ICD-10-CM | POA: Diagnosis not present

## 2020-12-15 DIAGNOSIS — U071 COVID-19: Secondary | ICD-10-CM | POA: Diagnosis not present

## 2020-12-15 DIAGNOSIS — N179 Acute kidney failure, unspecified: Secondary | ICD-10-CM | POA: Diagnosis not present

## 2020-12-15 LAB — GLUCOSE, CAPILLARY
Glucose-Capillary: 92 mg/dL (ref 70–99)
Glucose-Capillary: 97 mg/dL (ref 70–99)
Glucose-Capillary: 118 mg/dL — ABNORMAL HIGH (ref 70–99)
Glucose-Capillary: 119 mg/dL — ABNORMAL HIGH (ref 70–99)

## 2020-12-15 LAB — PROTIME-INR
INR: 2.7 — ABNORMAL HIGH (ref 0.8–1.2)
Prothrombin Time: 27.9 seconds — ABNORMAL HIGH (ref 11.4–15.2)

## 2020-12-15 MED ORDER — WARFARIN SODIUM 5 MG PO TABS
5.0000 mg | ORAL_TABLET | Freq: Once | ORAL | Status: AC
  Administered 2020-12-15: 5 mg via ORAL
  Filled 2020-12-15: qty 1

## 2020-12-15 NOTE — Progress Notes (Signed)
[facility] PROGRESS NOTE    Progress Note  Karl [name]  WFU:[id number] DOB: [birth year] DOA: 12/12/2020 PCP: Pincus Sanes, MD     Brief Narrative:   Karl [name] is an 65 y.o. 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  Assessment/Plan:   COVID-19 virus infection Fever, cough and shortness of breath, chest x-ray showed infiltrates He is currently satting 99% on room air, he was started on IV remdesivir. Procalcitonin is mildly elevated, he has a fever with mild leukopenia and no left shift is likely due to viral pneumonia likely COVID-19. He was started empirically on antibiotics on admission we will discontinue antibiotics. At this point in time there is no indication for steroids, baricitinib or Actemra. Cultures are negative at the time of this dictation  Acute kidney injury: With a baseline creatinine of 1.2 likely prerenal azotemia in the setting of infectious etiology and poor oral intake, he was started on IV fluids his creatinine is slowly trending down.  Uncontrolled diabetes mellitus type 2, A1c of 6.4 he is requiring minimal insulin his blood glucose well controlled.  Chronic left-sided weakness, CT scan of the head and MRI of the brain showed no acute findings. We have consulted physical therapy. INR is therapeutic continue current dose.  Essential hypertension: Continue Coreg holding ARB due to acute kidney injury.  Chronic systolic heart failure: With an EF of 20% in 2019, continue Coreg holding ARB. Probabaly a Good candidate for  Entresto.   DVT prophylaxis: lovenox Family Communication:none Status is: Inpatient  Remains inpatient appropriate because:Hemodynamically unstable   Dispo: The patient is from: Home              Anticipated d/c is to: Home              Anticipated d/c date is: 1 day              Patient currently is not medically stable to d/c.  Physical therapy recommended home health PT.   Code Status:     Code Status Orders  (From admission, onward)         Start     Ordered   12/13/20 0028  Full code  Continuous        12/13/20 0029        Code Status History    Date Active Date Inactive Code Status Order ID Comments User Context   06/18/2018 1850 07/09/2018 1412 Full Code [id number]  Osvaldo Shipper, MD Inpatient   06/18/2018 1817 06/18/2018 1850 Full Code [id number]  Jerene Pitch Inpatient   06/14/2018 1541 06/18/2018 1815 Full Code [id number]  Edsel Petrin, DO Inpatient   06/05/2018 1320 06/06/2018 1512 Full Code [id number]  Crista Elliot, MD Inpatient   12/19/2017 2338 12/20/2017 2037 Full Code [id number]  Briscoe Deutscher, MD ED   Advance Care Planning Activity        IV Access:    Peripheral IV   Procedures and diagnostic studies:   MR BRAIN WO CONTRAST  Result Date: 12/13/2020 CLINICAL DATA:  Weakness over the last 6 days. EXAM: MRI HEAD WITHOUT  CONTRAST TECHNIQUE: Multiplanar, multiecho pulse sequences of the brain and surrounding structures were obtained without intravenous contrast. COMPARISON:  06/14/2018 FINDINGS: Brain: Diffusion imaging does not show any acute or subacute infarction. There are old small vessel infarctions within both cerebellar hemispheres. No focal brainstem insult. Cerebral hemispheres show old infarction in the right thalamus. There is an old infarction in the right PCA territory affecting the posteromedial temporal lobe and occipital lobe. The study does not show chronic small-vessel ischemic changes of the white matter. No hydrocephalus or  extra-axial collection. Vascular: Major vessels at the base of the brain show flow. Skull and upper cervical spine: Negative Sinuses/Orbits: Clear/normal Other: None IMPRESSION: No acute or reversible finding. Old small vessel infarctions within both cerebellar hemispheres. Old infarction in the right PCA territory affecting the posteromedial right temporal lobe and occipital lobe and the right thalamus. Electronically Signed   By: Paulina Fusi M.D.   On: 12/13/2020 15:22     Medical Consultants:    None.  Anti-Infectives:   none  Subjective:    Karl [name] he relates he feels great still weak.  Objective:    Vitals:   12/14/20 1323 12/14/20 1705 12/14/20 2146 12/15/20 0524  BP: 101/70 99/73 101/64 103/77  Pulse: 73 70 68 76  Resp: 16  20 18   Temp: 99.4 F (37.4 C)  98.6 F (37 C) 98.3 F (36.8 C)  TempSrc: Oral  Oral Oral  SpO2: 99%  99% 98%  Weight:      Height:       SpO2: 98 %   Intake/Output Summary (Last 24 hours) at 12/15/2020 0817 Last data filed at 12/14/2020 1852 Gross per 24 hour  Intake 553.06 ml  Output --  Net 553.06 ml   Filed Weights   12/12/20 1533  Weight: 68 kg    Exam: General exam: In no acute distress. Respiratory system: Good air movement and clear to auscultation. Cardiovascular system: S1 & S2 heard, RRR. No JVD. Gastrointestinal system: Abdomen is nondistended, soft and nontender.  Extremities: No pedal edema. Skin: No rashes, lesions or ulcers Psychiatry: Judgement and insight appear normal. Mood & affect appropriate.    Data Reviewed:    Labs: Basic Metabolic Panel: Recent Labs  Lab 12/12/20 2052 12/14/20 0320  NA 134* 136  K 4.2 3.9  CL 99 102  CO2 24 26  GLUCOSE 165* 108*  BUN 32* 27*  CREATININE 1.93* 1.58*  CALCIUM 9.4 8.9   GFR Estimated Creatinine Clearance: 44.2 mL/min (A) (by C-G formula based on SCr of 1.58 mg/dL (H)). Liver Function Tests: Recent Labs  Lab 12/12/20 2052  AST 63*  ALT 43   ALKPHOS 51  BILITOT 0.8  PROT 8.1  ALBUMIN 3.8   No results for input(s): LIPASE, AMYLASE in the last 168 hours. No results for input(s): AMMONIA in the last 168 hours. Coagulation profile Recent Labs  Lab 12/13/20 2036 12/14/20 0320 12/15/20 0311  INR 1.8* 2.2* 2.7*   COVID-19 Labs  Recent Labs    12/13/20 0048  DDIMER 0.77*  FERRITIN 1,135*  LDH 198*  CRP 5.6*    Lab Results  Component Value Date   SARSCOV2NAA POSITIVE (A) 12/12/2020    CBC: Recent Labs  Lab 12/12/20 2052 12/14/20 0320  WBC 3.6* 2.2*  NEUTROABS 2.2  --   HGB 12.4* 10.8*  HCT 39.3 34.7*  MCV 82.2 82.8  PLT 154 138*   Cardiac Enzymes: No results for input(s): CKTOTAL, CKMB, CKMBINDEX, TROPONINI in the last  168 hours. BNP (last 3 results) No results for input(s): PROBNP in the last 8760 hours. CBG: Recent Labs  Lab 12/14/20 0818 12/14/20 1211 12/14/20 1639 12/14/20 2149 12/15/20 0742  GLUCAP 96 132* 111* 110* 92   D-Dimer: Recent Labs    12/13/20 0048  DDIMER 0.77*   Hgb A1c: Recent Labs    12/13/20 0048  HGBA1C 6.4*   Lipid Profile: Recent Labs    12/13/20 0048  TRIG 111   Thyroid function studies: No results for input(s): TSH, T4TOTAL, T3FREE, THYROIDAB in the last 72 hours.  Invalid input(s): FREET3 Anemia work up: Recent Labs    12/13/20 0048  FERRITIN 1,135*   Sepsis Labs: Recent Labs  Lab 12/12/20 2052 12/13/20 0048 12/13/20 0600 12/14/20 0320  PROCALCITON  --  0.87  --   --   WBC 3.6*  --   --  2.2*  LATICACIDVEN  --   --  0.9  --    Microbiology Recent Results (from the past 240 hour(s))  Resp Panel by RT-PCR (Flu A&B, Covid) Nasopharyngeal Swab     Status: Abnormal   Collection Time: 12/12/20 10:05 PM   Specimen: Nasopharyngeal Swab; Nasopharyngeal(NP) swabs in vial transport medium  Result Value Ref Range Status   SARS Coronavirus 2 by RT PCR POSITIVE (A) NEGATIVE Final    Comment: CRITICAL RESULT CALLED TO, READ BACK BY AND VERIFIED  WITH: Jule Ser RN 12/12/2020 @2310  BY P.HENDERSON (NOTE) SARS-CoV-2 target nucleic acids are DETECTED.  The SARS-CoV-2 RNA is generally detectable in upper respiratory specimens during the acute phase of infection. Positive results are indicative of the presence of the identified virus, but do not rule out bacterial infection or co-infection with other pathogens not detected by the test. Clinical correlation with patient history and other diagnostic information is necessary to determine patient infection status. The expected result is Negative.  Fact Sheet for Patients:  Fact Sheet for Healthcare Providers: BloggerCourse.com  This test is not yet approved or cleared by the [employer] FDA and  has been authorized for detection and/or diagnosis of SARS-CoV-2 by FDA under an Emergency Use Authorization (EUA).  This EUA will remain in effect  (meaning this test can be used) for the duration of  the COVID-19 declaration under Section 564(b)(1) of the Act, 21 U.S.C. section 360bbb-3(b)(1), unless the authorization is terminated or revoked sooner.     Influenza A by PCR NEGATIVE NEGATIVE Final   Influenza B by PCR NEGATIVE NEGATIVE Final    Comment: (NOTE) The Xpert Xpress SARS-CoV-2/FLU/RSV plus assay is intended as an aid in the diagnosis of influenza from Nasopharyngeal swab specimens and should not be used as a sole basis for treatment. Nasal washings and aspirates are unacceptable for Xpert Xpress SARS-CoV-2/FLU/RSV testing.  Fact Sheet for Patients: [place]  Fact Sheet for Healthcare Providers: BloggerCourse.com  This test is not yet approved or cleared by the [employer] FDA and has been authorized for detection and/or diagnosis of SARS-CoV-2 by FDA under an Emergency Use Authorization (EUA). This EUA will remain in effect (meaning this  test can be used) for the duration of the COVID-19 declaration under Section 564(b)(1) of the Act, 21 U.S.C. section 360bbb-3(b)(1), unless the authorization is terminated or revoked.  Performed at [facility], [address] [place], [place]   Culture, blood (Routine X 2) w Reflex to ID Panel     Status: None (Preliminary result)   Collection Time: 12/13/20  8:36 PM  Specimen: BLOOD  Result Value Ref Range Status   Specimen Description   Final    BLOOD RIGHT ARM Performed at [facility], [address], [place], [place] [address]    Special Requests   Final    BOTTLES DRAWN AEROBIC AND ANAEROBIC Blood Culture adequate volume Performed at [facility], [address] [place], [place] [address]    Culture   Final    NO GROWTH 2 DAYS Performed at [facility], [address], [place], [place] [address]    Report Status PENDING  Incomplete  Culture, blood (Routine X 2) w Reflex to ID Panel     Status: None (Preliminary result)   Collection Time: 12/13/20  8:36 PM   Specimen: BLOOD  Result Value Ref Range Status   Specimen Description   Final    BLOOD LEFT ARM Performed at [facility], [address], [place], [place] [address]    Special Requests   Final    BOTTLES DRAWN AEROBIC ONLY Blood Culture adequate volume Performed at [facility], [address], [place], [place] [address]    Culture   Final    NO GROWTH 2 DAYS Performed at [facility], [address] [place], [place] [address]    Report Status PENDING  Incomplete     Medications:   . vitamin C  500 mg Oral Daily  . carvedilol  12.5 mg Oral BID WC  . cholecalciferol  1,000 Units Oral Daily  . guaiFENesin  1,200 mg Oral BID  . insulin aspart  0-5 Units Subcutaneous QHS  . insulin aspart  0-9 Units Subcutaneous TID WC  . Warfarin - Pharmacist Dosing Inpatient   Does not apply q1600  .  zinc sulfate  220 mg Oral Daily   Continuous Infusions: . azithromycin 500 mg (12/14/20 1756)  . cefTRIAXone (ROCEPHIN)  IV 1 g (12/14/20 1713)  . remdesivir 100 mg in NS 100 mL 100 mg (12/14/20 1350)      LOS: 2 days   Marinda Elk  [facility]  12/15/2020, 8:17 AM

## 2020-12-15 NOTE — Progress Notes (Signed)
Physical Therapy Treatment Patient Details Name: Karl [name] MRN: [id number] DOB: 1956-03-20 Today's Date: 12/15/2020    History of Present Illness Pt is a 65 year old male with h/o CVA and residual LT sided weakness and numbness who is admitted with increased weakness and COVID-19.    PT Comments    Pt making good progress.  He reports he is much better than when he was admitted and feels he is getting close to baseline.  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  His O2 sats were stable on RA.  Pt report having DME and 24 hr support.     Follow Up Recommendations  Home health PT;Supervision/Assistance - 24 hour     Equipment Recommendations  None recommended by PT (has dme)    Recommendations for Other Services       Precautions / Restrictions Precautions Precautions: Fall    Mobility  Bed Mobility Overal bed mobility: Needs Assistance Bed Mobility: Supine to Sit;Sit to Supine     Supine to sit: Supervision Sit to supine: Supervision      Transfers Overall transfer level: Needs assistance Equipment used: Rolling walker (2 wheeled) Transfers: Sit to/from Stand           General transfer comment: Close supervision.  Performed sit to stand x 7 throughout session with cues to slowly lower.  Pt did require increased time to rise but no physical assist.  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   Stairs             Wheelchair Mobility    Modified Rankin (Stroke Patients Only)       Balance Overall balance assessment: Needs assistance Sitting-balance support: No upper extremity supported Sitting balance-Leahy Scale: Good     Standing balance support: Bilateral upper extremity supported Standing balance-Leahy Scale: Poor Standing balance comment: Relied on RW or single UE support but was steady with UE support                            Cognition Arousal/Alertness: Awake/alert Behavior During Therapy: WFL for tasks assessed/performed Overall Cognitive Status: Within Functional Limits for tasks assessed                                        Exercises      General Comments General comments (skin integrity, edema, etc.): Pt on RA with O2 sats 98%.  Pt resides with his daughter and reports if she is not there her boyfriend is.  States he has RW, rollator, and w/c if needed.      Pertinent Vitals/Pain Pain Assessment: No/denies pain    Home Living                      Prior Function  PT Goals (current goals can now be found in the care plan section) Acute Rehab PT Goals Patient Stated Goal: To regain strength PT Goal Formulation: With patient Time For Goal Achievement: 12/27/20 Potential to Achieve Goals: Good Progress towards PT goals: Progressing toward goals    Frequency    Min 3X/week      PT Plan Current plan remains appropriate    Co-evaluation              AM-PAC PT "6 Clicks" Mobility   Outcome Measure  Help needed turning from your back to your side while in a flat bed without using bedrails?: None Help needed moving from lying on your back to sitting on the side of a flat bed without using bedrails?: None Help needed moving to and from a bed to a chair (including a wheelchair)?: A Little Help needed standing up from a chair using your arms (e.g., wheelchair or bedside  chair)?: A Little Help needed to walk in hospital room?: A Little Help needed climbing 3-5 steps with a railing? : A Little 6 Click Score: 20    End of Session Equipment Utilized During Treatment: Gait belt Activity Tolerance: Patient tolerated treatment well Patient left: in bed;with call bell/phone within reach;with bed alarm set Nurse Communication: Mobility status PT Visit Diagnosis: Unsteadiness on feet (R26.81);Other symptoms and signs involving the nervous system (R29.898)     Time: 5427-0623 PT Time Calculation (min) (ACUTE ONLY): 23 min  Charges:  $Gait Training: 8-22 mins $Therapeutic Activity: 8-22 mins                     Anise Salvo, PT Acute Rehab Services Pager [phone] [facility] [phone]     Rayetta Humphrey 12/15/2020, 5:46 PM

## 2020-12-15 NOTE — Progress Notes (Signed)
Karl [name] for warfarin Indication: stroke  Allergies  Allergen Reactions  . Lipitor [Atorvastatin] Nausea And Vomiting    Patient Measurements: Height: '6\' 2"'  (188 cm) Weight: 68 kg (150 lb) IBW/kg (Calculated) : 82.2  Vital Signs: Temp: 98.3 F (36.8 C) (01/12 0524) Temp Source: Oral (01/12 0524) BP: 103/77 (01/12 0524) Pulse Rate: 76 (01/12 0524)  Labs: Recent Labs    12/12/20 2052 12/13/20 2036 12/14/20 0320 12/15/20 0311  HGB 12.4*  --  10.8*  --   HCT 39.3  --  34.7*  --   PLT 154  --  138*  --   LABPROT  --  20.2* 23.5* 27.9*  INR  --  1.8* 2.2* 2.7*  CREATININE 1.93*  --  1.58*  --     Estimated Creatinine Clearance: 44.2 mL/min (A) (by C-G formula based on SCr of 1.58 mg/dL (H)).   Medical History: Past Medical History:  Diagnosis Date  . Acute CVA (cerebrovascular accident) ([place]) 06/14/2018  . Coronary artery disease    blockage   Stent Dr. Lovena Le 15 years 1998  . Dyspnea   . Hypertension   . Stroke [facility]) 2019   denies residual on 06/14/2018  . Type II diabetes mellitus ([place]) 07/2014 dx    Medications:  Medications Prior to Admission  Medication Sig Dispense Refill Last Dose  . acetaminophen (TYLENOL) 325 MG tablet Take 1-2 tablets (325-650 mg total) by mouth every 4 (four) hours as needed for mild pain.   unk  . baclofen (LIORESAL) 10 MG tablet Take 1-2 tablets (10-20 mg total) by mouth 3 (three) times daily as needed for muscle spasms. 120 each 5 12/11/2020  . carvedilol (COREG) 12.5 MG tablet TAKE 1 TABLET BY MOUTH TWICE DAILY WITH MEALS (Patient taking differently: Take 12.5 mg by mouth 2 (two) times daily with a meal.) 180 tablet 1 12/11/2020  . losartan (COZAAR) 25 MG tablet Take 1 tablet by mouth once daily (Patient taking differently: Take 25 mg by mouth daily.) 90 tablet 1 12/11/2020  . metFORMIN (GLUCOPHAGE) 1000 MG tablet TAKE 1 TABLET BY MOUTH TWICE DAILY WITH A MEAL (Patient taking differently: Take 1,000 mg  by mouth 2 (two) times daily with a meal.) 180 tablet 1 12/11/2020  . potassium chloride (KLOR-CON) 10 MEQ tablet TAKE 1  BY MOUTH TWICE DAILY (Patient taking differently: Take 10 mEq by mouth 2 (two) times daily.) 180 tablet 0 12/11/2020  . rosuvastatin (CRESTOR) 20 MG tablet Take 1 tablet (20 mg total) by mouth daily. 90 tablet 3 12/11/2020  . warfarin (COUMADIN) 5 MG tablet TAKE 1 TO 1 & 1/2 (ONE & ONE-HALF) TABLETS BY MOUTH ONCE DAILY AS DIRECTED BY  COUMADIN  CLINIC (Patient taking differently: Take 5-7.5 mg by mouth See admin instructions. Take 1 tablet (62m) by mouth all days except on Monday and Friday take 1 &1/2 tablet (7.52m AS DIRECTED BY  COUMADIN  CLINIC) 35 tablet 2 12/11/2020  . blood glucose meter kit and supplies KIT Dispense based on patient and insurance preference. Use up to four times daily as directed. (FOR E11.9). 1 each 0   . Lancets 30G MISC Use to check blood sugars daily 100 each 3   . polyethylene glycol (MIRALAX / GLYCOLAX) packet Take 17 g by mouth 2 (two) times daily. (Patient not taking: No sig reported) 60 each 0 Not Taking at Unknown time   Scheduled:  . vitamin C  500 mg Oral Daily  . carvedilol  12.5  mg Oral BID WC  . cholecalciferol  1,000 Units Oral Daily  . guaiFENesin  1,200 mg Oral BID  . insulin aspart  0-5 Units Subcutaneous QHS  . insulin aspart  0-9 Units Subcutaneous TID WC  . Warfarin - Pharmacist Dosing Inpatient   Does not apply q1600  . zinc sulfate  220 mg Oral Daily   Infusions:  . remdesivir 100 mg in NS 100 mL 100 mg (12/15/20 0949)   Assessment: 74 yoM with PMH CVA, on warfarin PTA, HLD, HTN, sCHF, DM2, CAD s/p remote PCI   Baseline INR subtherapeutic  Prior anticoagulation: warfarin 7.5 mg Mon & Fri; 5 mg all other days; LD 12/12/19  Significant events:  Today, 12/15/2020:  CBC: Hgb slightly low at 10.8; Plt low at 138, stable ( 1/11)   INR 2.7, therapeutic  Major drug interactions: azithromycin added, may potentially increase INR    No bleeding issues noted   Diet ordered - 30-60% meals eaten on 1/11  Goal of Therapy: INR 2-3  Plan:  Warfarin 5 mg PO tonight at 16:00  Daily INR  CBC at least q72 hr while on warfarin  Monitor for signs of bleeding or thrombosis    Royetta Asal, PharmD, BCPS 12/15/2020 10:40 AM

## 2020-12-16 DIAGNOSIS — J1282 Pneumonia due to coronavirus disease 2019: Secondary | ICD-10-CM | POA: Diagnosis not present

## 2020-12-16 DIAGNOSIS — U071 COVID-19: Secondary | ICD-10-CM | POA: Diagnosis not present

## 2020-12-16 DIAGNOSIS — I10 Essential (primary) hypertension: Secondary | ICD-10-CM | POA: Diagnosis not present

## 2020-12-16 DIAGNOSIS — N179 Acute kidney failure, unspecified: Secondary | ICD-10-CM | POA: Diagnosis not present

## 2020-12-16 LAB — CBC WITH DIFFERENTIAL/PLATELET
Abs Immature Granulocytes: 0.02 10*3/uL (ref 0.00–0.07)
Basophils Absolute: 0 10*3/uL (ref 0.0–0.1)
Basophils Relative: 0 %
Eosinophils Absolute: 0.1 10*3/uL (ref 0.0–0.5)
Eosinophils Relative: 4 %
HCT: 33.7 % — ABNORMAL LOW (ref 39.0–52.0)
Hemoglobin: 10.5 g/dL — ABNORMAL LOW (ref 13.0–17.0)
Immature Granulocytes: 1 %
Lymphocytes Relative: 33 %
Lymphs Abs: 1 10*3/uL (ref 0.7–4.0)
MCH: 25.9 pg — ABNORMAL LOW (ref 26.0–34.0)
MCHC: 31.2 g/dL (ref 30.0–36.0)
MCV: 83.2 fL (ref 80.0–100.0)
Monocytes Absolute: 0.5 10*3/uL (ref 0.1–1.0)
Monocytes Relative: 17 %
Neutro Abs: 1.3 10*3/uL — ABNORMAL LOW (ref 1.7–7.7)
Neutrophils Relative %: 45 %
Platelets: 167 10*3/uL (ref 150–400)
RBC: 4.05 MIL/uL — ABNORMAL LOW (ref 4.22–5.81)
RDW: 14.2 % (ref 11.5–15.5)
WBC: 3 10*3/uL — ABNORMAL LOW (ref 4.0–10.5)
nRBC: 0 % (ref 0.0–0.2)

## 2020-12-16 LAB — BASIC METABOLIC PANEL
Anion gap: 11 (ref 5–15)
BUN: 25 mg/dL — ABNORMAL HIGH (ref 8–23)
CO2: 24 mmol/L (ref 22–32)
Calcium: 9 mg/dL (ref 8.9–10.3)
Chloride: 102 mmol/L (ref 98–111)
Creatinine, Ser: 1.49 mg/dL — ABNORMAL HIGH (ref 0.61–1.24)
GFR, Estimated: 51 mL/min — ABNORMAL LOW (ref >60)
Glucose, Bld: 96 mg/dL (ref 70–99)
Potassium: 3.6 mmol/L (ref 3.5–5.1)
Sodium: 137 mmol/L (ref 135–145)

## 2020-12-16 LAB — GLUCOSE, CAPILLARY
Glucose-Capillary: 106 mg/dL — ABNORMAL HIGH (ref 70–99)
Glucose-Capillary: 139 mg/dL — ABNORMAL HIGH (ref 70–99)
Glucose-Capillary: 125 mg/dL — ABNORMAL HIGH (ref 70–99)
Glucose-Capillary: 157 mg/dL — ABNORMAL HIGH (ref 70–99)

## 2020-12-16 LAB — PROTIME-INR
INR: 3.3 — ABNORMAL HIGH (ref 0.8–1.2)
Prothrombin Time: 32.8 seconds — ABNORMAL HIGH (ref 11.4–15.2)

## 2020-12-16 MED ORDER — WARFARIN SODIUM 2.5 MG PO TABS
2.5000 mg | ORAL_TABLET | Freq: Once | ORAL | Status: AC
  Administered 2020-12-16: 2.5 mg via ORAL
  Filled 2020-12-16: qty 1

## 2020-12-16 MED ORDER — SODIUM CHLORIDE 0.9 % IV SOLN: INTRAVENOUS | Status: AC

## 2020-12-16 NOTE — Progress Notes (Signed)
[facility] [facility] for warfarin Indication: stroke  Allergies  Allergen Reactions  . Lipitor [Atorvastatin] Nausea And Vomiting    Patient Measurements: Height: '6\' 2"'  (188 cm) Weight: 68 kg (150 lb) IBW/kg (Calculated) : 82.2  Vital Signs: Temp: 98.7 F (37.1 C) (01/13 0508) Temp Source: Oral (01/13 0508) BP: 111/74 (01/13 0508) Pulse Rate: 62 (01/13 0508)  Labs: Recent Labs    12/14/20 0320 12/15/20 0311 12/16/20 0324  HGB 10.8*  --  10.5*  HCT 34.7*  --  33.7*  PLT 138*  --  167  LABPROT 23.5* 27.9* 32.8*  INR 2.2* 2.7* 3.3*  CREATININE 1.58*  --  1.49*    Estimated Creatinine Clearance: 46.9 mL/min (A) (by C-G formula based on SCr of 1.49 mg/dL (H)).   Medical History: Past Medical History:  Diagnosis Date  . Acute CVA (cerebrovascular accident) ([place]) 06/14/2018  . Coronary artery disease    blockage   Stent Dr. Lovena Le 15 years 1998  . Dyspnea   . Hypertension   . Stroke [facility]) 2019   denies residual on 06/14/2018  . Type II diabetes mellitus ([place]) 07/2014 dx    Medications:  Medications Prior to Admission  Medication Sig Dispense Refill Last Dose  . acetaminophen (TYLENOL) 325 MG tablet Take 1-2 tablets (325-650 mg total) by mouth every 4 (four) hours as needed for mild pain.   unk  . baclofen (LIORESAL) 10 MG tablet Take 1-2 tablets (10-20 mg total) by mouth 3 (three) times daily as needed for muscle spasms. 120 each 5 12/11/2020  . carvedilol (COREG) 12.5 MG tablet TAKE 1 TABLET BY MOUTH TWICE DAILY WITH MEALS (Patient taking differently: Take 12.5 mg by mouth 2 (two) times daily with a meal.) 180 tablet 1 12/11/2020  . losartan (COZAAR) 25 MG tablet Take 1 tablet by mouth once daily (Patient taking differently: Take 25 mg by mouth daily.) 90 tablet 1 12/11/2020  . metFORMIN (GLUCOPHAGE) 1000 MG tablet TAKE 1 TABLET BY MOUTH TWICE DAILY WITH A MEAL (Patient taking differently: Take 1,000 mg by mouth 2 (two) times daily with a meal.)  180 tablet 1 12/11/2020  . potassium chloride (KLOR-CON) 10 MEQ tablet TAKE 1  BY MOUTH TWICE DAILY (Patient taking differently: Take 10 mEq by mouth 2 (two) times daily.) 180 tablet 0 12/11/2020  . rosuvastatin (CRESTOR) 20 MG tablet Take 1 tablet (20 mg total) by mouth daily. 90 tablet 3 12/11/2020  . warfarin (COUMADIN) 5 MG tablet TAKE 1 TO 1 & 1/2 (ONE & ONE-HALF) TABLETS BY MOUTH ONCE DAILY AS DIRECTED BY  COUMADIN  CLINIC (Patient taking differently: Take 5-7.5 mg by mouth See admin instructions. Take 1 tablet (58m) by mouth all days except on Monday and Friday take 1 &1/2 tablet (7.569m AS DIRECTED BY  COUMADIN  CLINIC) 35 tablet 2 12/11/2020  . blood glucose meter kit and supplies KIT Dispense based on patient and insurance preference. Use up to four times daily as directed. (FOR E11.9). 1 each 0   . Lancets 30G MISC Use to check blood sugars daily 100 each 3   . polyethylene glycol (MIRALAX / GLYCOLAX) packet Take 17 g by mouth 2 (two) times daily. (Patient not taking: No sig reported) 60 each 0 Not Taking at Unknown time   Scheduled:  . vitamin C  500 mg Oral Daily  . carvedilol  12.5 mg Oral BID WC  . cholecalciferol  1,000 Units Oral Daily  . guaiFENesin  1,200 mg Oral BID  .  insulin aspart  0-5 Units Subcutaneous QHS  . insulin aspart  0-9 Units Subcutaneous TID WC  . Warfarin - Pharmacist Dosing Inpatient   Does not apply q1600  . zinc sulfate  220 mg Oral Daily   Infusions:  . remdesivir 100 mg in NS 100 mL 100 mg (12/16/20 9507)   Assessment: 5 yoM with PMH CVA, on warfarin PTA, HLD, HTN, sCHF, DM2, CAD s/p remote PCI   Baseline INR subtherapeutic  Prior anticoagulation: warfarin 7.5 mg Mon & Fri; 5 mg all other days; LD 12/12/19  Significant events:  Today, 12/16/2020:  CBC: Hgb slightly low & stable at 10.5; Plt WNL   INR 3.3, supratherapeutic and increased by 0.6 since yesterday  Major drug interactions: azithromycin which may potentially increase INR was stopped on  1/12  No bleeding issues noted   Diet ordered - 50-75% meals eaten on 1/12  Goal of Therapy: INR 2-3  Plan:  Warfarin 2.5 mg PO x 1 today at 16:00  Daily INR  CBC at least q72 hr while on warfarin  Monitor for signs of bleeding or thrombosis    Pradyun Ishman P. Legrand Como, PharmD, [place] Please utilize Amion for appropriate phone number to reach the unit pharmacist ([place]) 12/16/2020 10:37 AM

## 2020-12-16 NOTE — Progress Notes (Signed)
[facility] PROGRESS NOTE    Progress Note  Karl [name]  ZYY:[id number] DOB: 1955/12/23 DOA: 12/12/2020 PCP: Pincus Sanes, MD     Brief Narrative:   Karl [name] is an 65 y.o. 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  Assessment/Plan:   COVID-19 virus infection: Today is her fourth day of IV remdesivir he is satting 99% on room air. Which was a remain negative till date. His oral intake is minimal.  Acute kidney injury: Likely prerenal azotemia with a baseline creatinine of 1.2 improving slowly with IV fluid hydration we will continue current fluids for the next 24 hours recheck a basic metabolic panel in the morning  Uncontrolled diabetes mellitus type 2, A1c of 6.4 he is requiring minimal insulin his blood glucose well controlled.  Chronic left-sided weakness, CT scan of the head and MRI of the brain showed no acute findings. We have consulted physical therapy. INR is therapeutic continue current dose.  Essential hypertension: Continue Coreg holding ARB due to acute kidney injury.  Chronic systolic heart failure: With an EF of 20% in 2019, continue Coreg holding ARB. Probabaly a Good candidate for Entresto.   DVT prophylaxis: lovenox Family Communication:none Status is: Inpatient  Remains inpatient appropriate because:Hemodynamically unstable   Dispo: The patient is from: Home              Anticipated d/c is to: Home              Anticipated d/c date is: 1 day              Patient currently is not medically stable to d/c.  Physical therapy  recommended home health PT. hopefully home in the morning.   Code Status:     Code Status Orders  (From admission, onward)         Start     Ordered   12/13/20 0028  Full code  Continuous        12/13/20 0029        Code Status History    Date Active Date Inactive Code Status Order ID Comments User Context   06/18/2018 1850 07/09/2018 1412 Full Code [id number]  Osvaldo Shipper, MD Inpatient   06/18/2018 1817 06/18/2018 1850 Full Code [id number]  Jerene Pitch Inpatient   06/14/2018 1541 06/18/2018 1815 Full Code [id number]  Edsel Petrin, DO Inpatient   06/05/2018 1320 06/06/2018 1512 Full Code [id number]  Crista Elliot, MD Inpatient   12/19/2017 2338 12/20/2017 2037 Full Code [id number]  Briscoe Deutscher, MD ED   Advance Care Planning Activity        IV Access:    Peripheral IV   Procedures and diagnostic studies:   No results found.   Medical Consultants:    None.  Anti-Infectives:   none  Subjective:    Karl [name]  feels better tolerating her diet.  Objective:    Vitals:   12/15/20 0524 12/15/20 1230 12/15/20 2230 12/16/20 0508  BP: 103/77 112/70 98/69 111/74  Pulse: 76 72 66 62  Resp: 18 20 18 15   Temp: 98.3 F (36.8  C) 98 F (36.7 C) 98.3 F (36.8 C) 98.7 F (37.1 C)  TempSrc: Oral Oral Oral Oral  SpO2: 98% 98% 99% 97%  Weight:      Height:       SpO2: 97 %   Intake/Output Summary (Last 24 hours) at 12/16/2020 1123 Last data filed at 12/16/2020 1000 Gross per 24 hour  Intake 321 ml  Output 400 ml  Net -79 ml   Filed Weights   12/12/20 1533  Weight: 68 kg    Exam: General exam: In no acute distress. Respiratory system: Good air movement and clear to auscultation. Cardiovascular system: S1 & S2 heard, RRR. No JVD. Gastrointestinal system: Abdomen is nondistended, soft and nontender.  Extremities: No pedal edema. Skin: No rashes, lesions or ulcers Psychiatry: Judgement and insight appear normal. Mood & affect  appropriate. Data Reviewed:    Labs: Basic Metabolic Panel: Recent Labs  Lab 12/12/20 2052 12/14/20 0320 12/16/20 0324  NA 134* 136 137  K 4.2 3.9 3.6  CL 99 102 102  CO2 24 26 24   GLUCOSE 165* 108* 96  BUN 32* 27* 25*  CREATININE 1.93* 1.58* 1.49*  CALCIUM 9.4 8.9 9.0   GFR Estimated Creatinine Clearance: 46.9 mL/min (A) (by C-G formula based on SCr of 1.49 mg/dL (H)). Liver Function Tests: Recent Labs  Lab 12/12/20 2052  AST 63*  ALT 43  ALKPHOS 51  BILITOT 0.8  PROT 8.1  ALBUMIN 3.8   No results for input(s): LIPASE, AMYLASE in the last 168 hours. No results for input(s): AMMONIA in the last 168 hours. Coagulation profile Recent Labs  Lab 12/13/20 2036 12/14/20 0320 12/15/20 0311 12/16/20 0324  INR 1.8* 2.2* 2.7* 3.3*   COVID-19 Labs  No results for input(s): DDIMER, FERRITIN, LDH, CRP in the last 72 hours.  Lab Results  Component Value Date   SARSCOV2NAA POSITIVE (A) 12/12/2020    CBC: Recent Labs  Lab 12/12/20 2052 12/14/20 0320 12/16/20 0324  WBC 3.6* 2.2* 3.0*  NEUTROABS 2.2  --  1.3*  HGB 12.4* 10.8* 10.5*  HCT 39.3 34.7* 33.7*  MCV 82.2 82.8 83.2  PLT 154 138* 167   Cardiac Enzymes: No results for input(s): CKTOTAL, CKMB, CKMBINDEX, TROPONINI in the last 168 hours. BNP (last 3 results) No results for input(s): PROBNP in the last 8760 hours. CBG: Recent Labs  Lab 12/15/20 0742 12/15/20 1130 12/15/20 1640 12/15/20 2230 12/16/20 0747  GLUCAP 92 97 118* 119* 106*   D-Dimer: No results for input(s): DDIMER in the last 72 hours. Hgb A1c: No results for input(s): HGBA1C in the last 72 hours. Lipid Profile: No results for input(s): CHOL, HDL, LDLCALC, TRIG, CHOLHDL, LDLDIRECT in the last 72 hours. Thyroid function studies: No results for input(s): TSH, T4TOTAL, T3FREE, THYROIDAB in the last 72 hours.  Invalid input(s): FREET3 Anemia work up: No results for input(s): VITAMINB12, FOLATE, FERRITIN, TIBC, IRON, RETICCTPCT in the  last 72 hours. Sepsis Labs: Recent Labs  Lab 12/12/20 2052 12/13/20 0048 12/13/20 0600 12/14/20 0320 12/16/20 0324  PROCALCITON  --  0.87  --   --   --   WBC 3.6*  --   --  2.2* 3.0*  LATICACIDVEN  --   --  0.9  --   --    Microbiology Recent Results (from the past 240 hour(s))  Resp Panel by RT-PCR (Flu A&B, Covid) Nasopharyngeal Swab     Status: Abnormal   Collection Time: 12/12/20 10:05 PM   Specimen: Nasopharyngeal Swab; Nasopharyngeal(NP) swabs  in vial transport medium  Result Value Ref Range Status   SARS Coronavirus 2 by RT PCR POSITIVE (A) NEGATIVE Final    Comment: CRITICAL RESULT CALLED TO, READ BACK BY AND VERIFIED WITH: Jule Ser RN 12/12/2020 @2310  BY P.HENDERSON (NOTE) SARS-CoV-2 target nucleic acids are DETECTED.  The SARS-CoV-2 RNA is generally detectable in upper respiratory specimens during the acute phase of infection. Positive results are indicative of the presence of the identified virus, but do not rule out bacterial infection or co-infection with other pathogens not detected by the test. Clinical correlation with patient history and other diagnostic information is necessary to determine patient infection status. The expected result is Negative.  Fact Sheet for Patients:  Fact Sheet for Healthcare Providers: BloggerCourse.com  This test is not yet approved or cleared by the [employer]iousBroker.[place] FDA and  has been authorized for detection and/or diagnosis of SARS-CoV-2 by FDA under an Emergency Use Authorization (EUA).  This EUA will remain in effect  (meaning this test can be used) for the duration of  the COVID-19 declaration under Section 564(b)(1) of the Act, 21 U.S.C. section 360bbb-3(b)(1), unless the authorization is terminated or revoked sooner.     Influenza A by PCR NEGATIVE NEGATIVE Final   Influenza B by PCR NEGATIVE NEGATIVE Final    Comment: (NOTE) The Xpert Xpress  SARS-CoV-2/FLU/RSV plus assay is intended as an aid in the diagnosis of influenza from Nasopharyngeal swab specimens and should not be used as a sole basis for treatment. Nasal washings and aspirates are unacceptable for Xpert Xpress SARS-CoV-2/FLU/RSV testing.  Fact Sheet for Patients: [place]  Fact Sheet for Healthcare Providers: BloggerCourse.com  This test is not yet approved or cleared by the SeriousBroker.it FDA and has been authorized for detection and/or diagnosis of SARS-CoV-2 by FDA under an Emergency Use Authorization (EUA). This EUA will remain in effect (meaning this test can be used) for the duration of the COVID-19 declaration under Section 564(b)(1) of the Act, 21 U.S.C. section 360bbb-3(b)(1), unless the authorization is terminated or revoked.  Performed at [facility], [address], [place], [place]   Culture, blood (Routine X 2) w Reflex to ID Panel     Status: None (Preliminary result)   Collection Time: 12/13/20  8:36 PM   Specimen: BLOOD  Result Value Ref Range Status   Specimen Description   Final    BLOOD RIGHT ARM Performed at [facility], [address] [place], [place]    Special Requests   Final    BOTTLES DRAWN AEROBIC AND ANAEROBIC Blood Culture adequate volume Performed at [facility], [address] [place], [place]    Culture   Final    NO GROWTH 3 DAYS Performed at [facility], [address], [place], [place]    Report Status PENDING  Incomplete  Culture, blood (Routine X 2) w Reflex to ID Panel     Status: None (Preliminary result)   Collection Time: 12/13/20  8:36 PM   Specimen: BLOOD  Result Value Ref Range Status   Specimen Description   Final    BLOOD LEFT ARM Performed at [facility], [address] [place], [place]    Special Requests    Final    BOTTLES DRAWN AEROBIC ONLY Blood Culture adequate volume Performed at [facility], [address], [place], [place]    Culture   Final  NO GROWTH 3 DAYS Performed at [facility], [address] [place], [place] [address]    Report Status PENDING  Incomplete     Medications:   . vitamin C  500 mg Oral Daily  . carvedilol  12.5 mg Oral BID WC  . cholecalciferol  1,000 Units Oral Daily  . guaiFENesin  1,200 mg Oral BID  . insulin aspart  0-5 Units Subcutaneous QHS  . insulin aspart  0-9 Units Subcutaneous TID WC  . warfarin  2.5 mg Oral ONCE-1600  . Warfarin - Pharmacist Dosing Inpatient   Does not apply q1600  . zinc sulfate  220 mg Oral Daily   Continuous Infusions: . remdesivir 100 mg in NS 100 mL 100 mg (12/16/20 0922)      LOS: 3 days   Marinda Elk  [facility]  12/16/2020, 11:23 AM

## 2020-12-16 NOTE — Progress Notes (Signed)
Occupational Therapy Treatment Patient Details Name: Karl [name] MRN: [id number] DOB: [birth year] Today's Date: 12/16/2020    History of present illness Pt is a 65 year old male with h/o CVA and residual LT sided weakness and numbness who is admitted with increased weakness and COVID-19.   OT comments  Treatment focused on improving independence with ADLs and mobility. Patient able to don socks supine in bed, stand at sink to wash hands and perform toileting with min guard from therapist. Patient ambulated to bathroom with RW and min guard from therapist without loss of balance. Patient's left knee unstable but reports this is normal. Patient reports nearing baseline and feeling stronger. Updated recommendations - patient does not need HH OT at discharge.    Follow Up Recommendations  No OT follow up    Equipment Recommendations  None recommended by OT    Recommendations for Other Services      Precautions / Restrictions Precautions Precautions: Fall Precaution Comments: Airborn/Contact Restrictions Weight Bearing Restrictions: No       Mobility Bed Mobility Overal bed mobility: Modified Independent                Transfers Overall transfer level: Needs assistance Equipment used: Rolling walker (2 wheeled) Transfers: Sit to/from Stand Sit to Stand: Min guard         General transfer comment: Min guard to ambulate to bathroom with RW. Patient's left knee unstable but patient never lost his balance. Patient's movements slow and controlled. Patient reports ambulation and left knee at baseline. No complaints of fatigue or shob.    Balance Overall balance assessment: Mild deficits observed, not formally tested                                         ADL either performed or assessed with clinical judgement   ADL       Grooming: Min guard;Standing;Wash/dry hands               Lower Body Dressing: Set up Lower Body Dressing Details  (indicate cue type and reason): Patient able to don socks in bed. Toilet Transfer: Actuary Details (indicate cue type and reason): ambulated to bathroom. performed toilet transfer - bsc placed over toilet to elevate. Toileting- Architect and Hygiene: Min guard;Sit to/from stand Toileting - Clothing Manipulation Details (indicate cue type and reason): Patient able to manipulate hospital gown and underwear.             Vision Patient Visual Report: No change from baseline     Perception     Praxis      Cognition Arousal/Alertness: Awake/alert Behavior During Therapy: WFL for tasks assessed/performed Overall Cognitive Status: Within Functional Limits for tasks assessed                                          Exercises     Shoulder Instructions       General Comments      Pertinent Vitals/ Pain       Pain Assessment: No/denies pain  Home Living  Prior Functioning/Environment              Frequency           Progress Toward Goals  OT Goals(current goals can now be found in the care plan section)  Progress towards OT goals: Progressing toward goals  Acute Rehab OT Goals Patient Stated Goal: To regain strength OT Goal Formulation: With patient Time For Goal Achievement: 12/27/20 Potential to Achieve Goals: Good  Plan Discharge plan needs to be updated    Co-evaluation          OT goals addressed during session: ADL's and self-care      AM-PAC OT "6 Clicks" Daily Activity     Outcome Measure   Help from another person eating meals?: None Help from another person taking care of personal grooming?: A Little Help from another person toileting, which includes using toliet, bedpan, or urinal?: A Little Help from another person bathing (including washing, rinsing, drying)?: A Little Help from another person to put on and  taking off regular upper body clothing?: A Little Help from another person to put on and taking off regular lower body clothing?: A Little 6 Click Score: 19    End of Session Equipment Utilized During Treatment: Rolling walker  OT Visit Diagnosis: Unsteadiness on feet (R26.81);Hemiplegia and hemiparesis Hemiplegia - Right/Left: Left Hemiplegia - dominant/non-dominant: Non-Dominant Hemiplegia - caused by: Cerebral infarction   Activity Tolerance Patient tolerated treatment well   Patient Left in bed;with call bell/phone within reach;with bed alarm set   Nurse Communication Mobility status        Time: 3832-9191 OT Time Calculation (min): 12 min  Charges: OT General Charges $OT Visit: 1 Visit OT Treatments $Self Care/Home Management : 8-22 mins  Waldron Session, OTR/L Acute Care Rehab Services  Office [phone] Pager: [phone]    Kelli Churn 12/16/2020, 3:56 PM

## 2020-12-16 NOTE — Care Management Important Message (Signed)
Important Message  Patient Details IM Letter placed in Patients door [facility].  Name: ELIYA GEIMAN MRN: [id number] Date of Birth: 06-30-1956   Medicare Important Message Given:  Yes     [name] 12/16/2020, 1:06 PM

## 2020-12-17 DIAGNOSIS — N179 Acute kidney failure, unspecified: Secondary | ICD-10-CM | POA: Diagnosis not present

## 2020-12-17 DIAGNOSIS — J1282 Pneumonia due to coronavirus disease 2019: Secondary | ICD-10-CM | POA: Diagnosis not present

## 2020-12-17 DIAGNOSIS — U071 COVID-19: Secondary | ICD-10-CM | POA: Diagnosis not present

## 2020-12-17 DIAGNOSIS — I10 Essential (primary) hypertension: Secondary | ICD-10-CM | POA: Diagnosis not present

## 2020-12-17 LAB — GLUCOSE, CAPILLARY: Glucose-Capillary: 112 mg/dL — ABNORMAL HIGH (ref 70–99)

## 2020-12-17 LAB — BASIC METABOLIC PANEL
Anion gap: 9 (ref 5–15)
BUN: 18 mg/dL (ref 8–23)
CO2: 25 mmol/L (ref 22–32)
Calcium: 8.9 mg/dL (ref 8.9–10.3)
Chloride: 105 mmol/L (ref 98–111)
Creatinine, Ser: 1.4 mg/dL — ABNORMAL HIGH (ref 0.61–1.24)
GFR, Estimated: 55 mL/min — ABNORMAL LOW (ref >60)
Glucose, Bld: 100 mg/dL — ABNORMAL HIGH (ref 70–99)
Potassium: 4 mmol/L (ref 3.5–5.1)
Sodium: 139 mmol/L (ref 135–145)

## 2020-12-17 LAB — PROTIME-INR
INR: 3.8 — ABNORMAL HIGH (ref 0.8–1.2)
Prothrombin Time: 36.4 seconds — ABNORMAL HIGH (ref 11.4–15.2)

## 2020-12-17 NOTE — Progress Notes (Signed)
Transport to car via wheel chair, all pts belongings and discharge instructions.

## 2020-12-17 NOTE — Progress Notes (Signed)
[facility] for war[facility]in Indication: stroke  Allergies  Allergen Reactions  . Lipitor [Atorvastatin] Nausea And Vomiting    Patient Measurements: Height: '6\' 2"'  (188 cm) Weight: 68 kg (150 lb) IBW/kg (Calculated) : 82.2  Vital Signs: Temp: 98.4 F (36.9 C) (01/14 0554) Temp Source: Oral (01/14 0554) BP: 114/74 (01/14 0554) Pulse Rate: 64 (01/14 0554)  Labs: Recent Labs    12/15/20 0311 12/16/20 0324 12/17/20 0314  HGB  --  10.5*  --   HCT  --  33.7*  --   PLT  --  167  --   LABPROT 27.9* 32.8* 36.4*  INR 2.7* 3.3* 3.8*  CREATININE  --  1.49* 1.40*    Estimated Creatinine Clearance: 49.9 mL/min (A) (by C-G formula based on SCr of 1.4 mg/dL (H)).   Medical History: Past Medical History:  Diagnosis Date  . Acute CVA (cerebrovascular accident) ([place]) 06/14/2018  . Coronary artery disease    blockage   Stent Dr. Lovena Le 15 years 1998  . Dyspnea   . Hypertension   . Stroke [facility]) 2019   denies residual on 06/14/2018  . Type II diabetes mellitus ([place]) 07/2014 dx    Medications:  Medications Prior to Admission  Medication Sig Dispense Refill Last Dose  . acetaminophen (TYLENOL) 325 MG tablet Take 1-2 tablets (325-650 mg total) by mouth every 4 (four) hours as needed for mild pain.   unk  . baclofen (LIORESAL) 10 MG tablet Take 1-2 tablets (10-20 mg total) by mouth 3 (three) times daily as needed for muscle spasms. 120 each 5 12/11/2020  . carvedilol (COREG) 12.5 MG tablet TAKE 1 TABLET BY MOUTH TWICE DAILY WITH MEALS (Patient taking differently: Take 12.5 mg by mouth 2 (two) times daily with a meal.) 180 tablet 1 12/11/2020  . losartan (COZAAR) 25 MG tablet Take 1 tablet by mouth once daily (Patient taking differently: Take 25 mg by mouth daily.) 90 tablet 1 12/11/2020  . metFORMIN (GLUCOPHAGE) 1000 MG tablet TAKE 1 TABLET BY MOUTH TWICE DAILY WITH A MEAL (Patient taking differently: Take 1,000 mg by mouth 2 (two) times daily with a meal.) 180  tablet 1 12/11/2020  . potassium chloride (KLOR-CON) 10 MEQ tablet TAKE 1  BY MOUTH TWICE DAILY (Patient taking differently: Take 10 mEq by mouth 2 (two) times daily.) 180 tablet 0 12/11/2020  . rosuvastatin (CRESTOR) 20 MG tablet Take 1 tablet (20 mg total) by mouth daily. 90 tablet 3 12/11/2020  . warfarin (COUMADIN) 5 MG tablet TAKE 1 TO 1 & 1/2 (ONE & ONE-HALF) TABLETS BY MOUTH ONCE DAILY AS DIRECTED BY  COUMADIN  CLINIC (Patient taking differently: Take 5-7.5 mg by mouth See admin instructions. Take 1 tablet (90m) by mouth all days except on Monday and Friday take 1 &1/2 tablet (7.538m AS DIRECTED BY  COUMADIN  CLINIC) 35 tablet 2 12/11/2020  . blood glucose meter kit and supplies KIT Dispense based on patient and insurance preference. Use up to four times daily as directed. (FOR E11.9). 1 each 0   . Lancets 30G MISC Use to check blood sugars daily 100 each 3   . polyethylene glycol (MIRALAX / GLYCOLAX) packet Take 17 g by mouth 2 (two) times daily. (Patient not taking: No sig reported) 60 each 0 Not Taking at Unknown time   Scheduled:  . vitamin C  500 mg Oral Daily  . carvedilol  12.5 mg Oral BID WC  . cholecalciferol  1,000 Units Oral Daily  .  guaiFENesin  1,200 mg Oral BID  . insulin aspart  0-5 Units Subcutaneous QHS  . insulin aspart  0-9 Units Subcutaneous TID WC  . Warfarin - Pharmacist Dosing Inpatient   Does not apply q1600  . zinc sulfate  220 mg Oral Daily   Infusions:  . remdesivir 100 mg in NS 100 mL 100 mg (12/16/20 6378)   Assessment: 66 yoM with PMH CVA, on warfarin PTA, HLD, HTN, sCHF, DM2, CAD s/p remote PCI   Baseline INR subtherapeutic  Prior anticoagulation: warfarin 7.5 mg Mon & Fri; 5 mg all other days; LD 12/12/19  Significant events:  Today, 12/17/2020:  CBC: Hgb slightly low & stable at 10.5; Plt WNL   INR 3.8, supratherapeutic and increased  Major drug interactions: azithromycin which may potentially increase INR was stopped on 1/12  No bleeding issues  noted   Diet ordered - 50-75% meals eaten on 1/12, dislikes hospital food  Goal of Therapy: INR 2-3  Plan:  No Warfarin today, plan discharge home today, feel comfortable with continuing home regimen at discharge  Daily INR  CBC at least q72 hr while on warfarin  Monitor for signs of bleeding or thrombosis  Minda Ditto PharmD [facility] [phone] 12/17/2020, 6:31 AM

## 2020-12-17 NOTE — Progress Notes (Signed)
Patient verbalizes understanding of all discharge instructions. All questions answered. Awaiting ride home.

## 2020-12-17 NOTE — Discharge Summary (Signed)
Physician Discharge Summary  Karl [name] TKW:[id number] DOB: 10-Jul-1956 DOA: 12/12/2020  PCP: Binnie Rail, MD  Admit date: 12/12/2020 Discharge date: 12/17/2020  Admitted From: home Disposition:  Home  Recommendations for Outpatient Follow-up:  1. Follow up with PCP in 1-2 weeks, he is a good candidate for [employer]resto. Will need to be off his ARB for 48 hours before starting. 2. Please obtain BMP/CBC in one week   Home Health:Yes Equipment/Devices:None  Discharge Condition:Stable CODE STATUS:Full Diet recommendation: Heart Healthy  Brief/Interim Summary: 65 y.o. 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, essential hypertension, hyperlipidemia, chronic systolic heart failure with an EF of 20% from an echo in 2019, diabetes mellitus type 2 CAD status post PCI, who relates he has been vaccinated against COVID-19 but does not receive received his booster came into the ED complaining of weakness and cough, was found to be SARS-CoV-2 positive chest x-ray showed bilateral questionable bronchitis  Discharge Diagnoses:  Principal Problem:   COVID-19 virus infection Active Problems:   Essential hypertension   Dyslipidemia   Weakness   AKI (acute kidney injury) ([place])   Pneumonia due to COVID-19 virus COVID-19 virus: He finishes IV remdesivir treatment in house he was weaned to room air. He was tolerating his diet.  Acute kidney injury on chronic kidney disease stage IIIa: Likely prerenal azotemia in the setting of antihypertensive medication and decreased oral intake his creatinine returned to baseline after IV fluid hydration.  Diabetes mellitus type 2 controlled: With an A1c of 6.4 his oral hypoglycemic agents were held on admission he was covered with sliding scale no changes made he will resume his medications as an outpatient.  Chronic left-sided weakness: CT scan and MRI of the brain showed no acute findings. Physical therapy was consulted.  Essential  hypertension No changes made to her her medication.  Chronic systolic heart failure with an EF of 20% in 2019 no changes made to his medication. His PCP to evaluate as he is a good candidate for [employer].     Discharge Instructions  Discharge Instructions    Diet - low sodium heart healthy   Complete by: As directed    Increase activity slowly   Complete by: As directed      Allergies as of 12/17/2020      Reactions   Lipitor [atorvastatin] Nausea And Vomiting      Medication List    STOP taking these medications   polyethylene glycol 17 g packet Commonly known as: MIRALAX / GLYCOLAX     TAKE these medications   acetaminophen 325 MG tablet Commonly known as: TYLENOL Take 1-2 tablets (325-650 mg total) by mouth every 4 (four) hours as needed for mild pain.   baclofen 10 MG tablet Commonly known as: LIORESAL Take 1-2 tablets (10-20 mg total) by mouth 3 (three) times daily as needed for muscle spasms.   blood glucose meter kit and supplies Kit Dispense based on patient and insurance preference. Use up to four times daily as directed. (FOR E11.9).   carvedilol 12.5 MG tablet Commonly known as: COREG TAKE 1 TABLET BY MOUTH TWICE DAILY WITH MEALS   Lancets 30G Misc Use to check blood sugars daily   losartan 25 MG tablet Commonly known as: COZAAR Take 1 tablet by mouth once daily   metFORMIN 1000 MG tablet Commonly known as: GLUCOPHAGE TAKE 1 TABLET BY MOUTH TWICE DAILY WITH A MEAL What changed: See the new instructions.   potassium chloride 10 MEQ tablet Commonly  known as: KLOR-CON TAKE 1  BY MOUTH TWICE DAILY What changed:   how much to take  how to take this  when to take this  additional instructions   rosuvastatin 20 MG tablet Commonly known as: CRESTOR Take 1 tablet (20 mg total) by mouth daily.   warfarin 5 MG tablet Commonly known as: COUMADIN Take as directed. If you are unsure how to take this medication, talk to your nurse or  doctor. Original instructions: TAKE 1 TO 1 & 1/2 (ONE & ONE-HALF) TABLETS BY MOUTH ONCE DAILY AS DIRECTED BY  COUMADIN  CLINIC What changed:   how much to take  how to take this  when to take this  additional instructions       Allergies  Allergen Reactions  . Lipitor [Atorvastatin] Nausea And Vomiting    Consultations:  None   Procedures/Studies: DG Chest 2 View  Result Date: 12/12/2020 CLINICAL DATA:  Cough and fever over the last 5 days. EXAM: CHEST - 2 VIEW COMPARISON:  06/14/2018 FINDINGS: Heart size is normal. Mediastinal shadows are normal. There is evidence of emphysema with lucency and pulmonary scarring. No sign of infiltrate, collapse or effusion. There may be central bronchial thickening, suggesting bronchitis. No significant bone finding. IMPRESSION: Emphysema. Possible bronchitis. No consolidation or collapse. Electronically Signed   By: Nelson Chimes M.D.   On: 12/12/2020 16:24   CT Head Wo Contrast  Result Date: 12/12/2020 CLINICAL DATA:  Malaise and weakness for 5 days, chronic left-sided weakness from prior stroke EXAM: CT HEAD WITHOUT CONTRAST TECHNIQUE: Contiguous axial images were obtained from the base of the skull through the vertex without intravenous contrast. COMPARISON:  CT angiography, MR and CT 06/14/2018 FINDINGS: Brain: There are remote appearing areas of encephalomalacia involving the right thalamus, occipital lobe and bilateral cerebellar hemispheres corresponding to areas of infarct seen on comparison MR and CT imaging. No new CT evident areas of gray-white differentiation loss, cortically based or large vascular territory infarct are evident on these images. No evidence of acute hemorrhage, hydrocephalus, extra-axial collection, visible mass lesion or mass effect. Symmetric prominence of the ventricles, cisterns and sulci compatible with parenchymal volume loss. Additional patchy and confluent areas of white matter hypoattenuation are most compatible  with chronic microvascular angiopathy. Vascular: Atherosclerotic calcification of the carotid siphons and intradural vertebral arteries. No hyperdense vessel. Skull: No calvarial fracture or suspicious osseous lesion. No scalp swelling or hematoma. Sinuses/Orbits: Paranasal sinuses and mastoid air cells are predominantly clear. Included orbital structures are unremarkable. None Other: None. IMPRESSION: 1. Remote appearing areas of encephalomalacia involving the right thalamus, occipital lobe and bilateral cerebellar hemispheres corresponding to areas of infarct seen on comparison MR and CT imaging. 2. No clear acute areas of infarct on CT imaging. If there is persisting concern for acute/subacute infarction, MRI is more sensitive and specific for early features of ischemia. 3. Background of parenchymal volume loss and chronic microvascular angiopathy. 4. Intracranial atherosclerosis. Electronically Signed   By: Lovena Le M.D.   On: 12/12/2020 22:31   MR BRAIN WO CONTRAST  Result Date: 12/13/2020 CLINICAL DATA:  Weakness over the last 6 days. EXAM: MRI HEAD WITHOUT CONTRAST TECHNIQUE: Multiplanar, multiecho pulse sequences of the brain and surrounding structures were obtained without intravenous contrast. COMPARISON:  06/14/2018 FINDINGS: Brain: Diffusion imaging does not show any acute or subacute infarction. There are old small vessel infarctions within both cerebellar hemispheres. No focal brainstem insult. Cerebral hemispheres show old infarction in the right thalamus. There is an old  infarction in the right PCA territory affecting the posteromedial temporal lobe and occipital lobe. The study does not show chronic small-vessel ischemic changes of the white matter. No hydrocephalus or extra-axial collection. Vascular: Major vessels at the base of the brain show flow. Skull and upper cervical spine: Negative Sinuses/Orbits: Clear/normal Other: None IMPRESSION: No acute or reversible finding. Old small vessel  infarctions within both cerebellar hemispheres. Old infarction in the right PCA territory affecting the posteromedial right temporal lobe and occipital lobe and the right thalamus. Electronically Signed   By: Nelson Chimes M.D.   On: 12/13/2020 15:22    (Echo, Carotid, EGD, Colonoscopy, ERCP)    Subjective: No new complaints feels great.  Discharge Exam: Vitals:   12/16/20 2031 12/17/20 0554  BP: 110/65 114/74  Pulse: 62 64  Resp: 18 16  Temp: 98.7 F (37.1 C) 98.4 F (36.9 C)  SpO2: 99% 98%   Vitals:   12/16/20 0508 12/16/20 1337 12/16/20 2031 12/17/20 0554  BP: 111/74 107/70 110/65 114/74  Pulse: 62 65 62 64  Resp: '15 18 18 16  ' Temp: 98.7 F (37.1 C) 98.1 F (36.7 C) 98.7 F (37.1 C) 98.4 F (36.9 C)  TempSrc: Oral Oral Oral Oral  SpO2: 97% 98% 99% 98%  Weight:      Height:        General: Pt is alert, awake, not in acute distress Cardiovascular: RRR, S1/S2 +, no rubs, no gallops Respiratory: CTA bilaterally, no wheezing, no rhonchi Abdominal: Soft, NT, ND, bowel sounds + Extremities: no edema, no cyanosis    The results of significant diagnostics from this hospitalization (including imaging, microbiology, ancillary and laboratory) are listed below for reference.     Microbiology: Recent Results (from the past 240 hour(s))  Resp Panel by RT-PCR (Flu A&B, Covid) Nasopharyngeal Swab     Status: Abnormal   Collection Time: 12/12/20 10:05 PM   Specimen: Nasopharyngeal Swab; Nasopharyngeal(NP) swabs in vial transport medium  Result Value Ref Range Status   SARS Coronavirus 2 by RT PCR POSITIVE (A) NEGATIVE Final    Comment: CRITICAL RESULT CALLED TO, READ BACK BY AND VERIFIED WITH: Zenon Mayo RN 12/12/2020 '@2310'  BY P.HENDERSON (NOTE) SARS-CoV-2 target nucleic acids are DETECTED.  The SARS-CoV-2 RNA is generally detectable in upper respiratory specimens during the acute phase of infection. Positive results are indicative of the presence of the identified  virus, but do not rule out bacterial infection or co-infection with other pathogens not detected by the test. Clinical correlation with patient history and other diagnostic information is necessary to determine patient infection status. The expected result is Negative.  Fact Sheet for Patients: EntrepreneurPulse.com.au  Fact Sheet for Healthcare Providers: IncredibleEmployment.be  This test is not yet approved or cleared by the [place] FDA and  has been authorized for detection and/or diagnosis of SARS-CoV-2 by FDA under an Emergency Use Authorization (EUA).  This EUA will remain in effect  (meaning this test can be used) for the duration of  the COVID-19 declaration under Section 564(b)(1) of the Act, 21 U.S.C. section 360bbb-3(b)(1), unless the authorization is terminated or revoked sooner.     Influenza A by PCR NEGATIVE NEGATIVE Final   Influenza B by PCR NEGATIVE NEGATIVE Final    Comment: (NOTE) The Xpert Xpress SARS-CoV-2/FLU/RSV plus assay is intended as an aid in the diagnosis of influenza from Nasopharyngeal swab specimens and should not be used as a sole basis for treatment. Nasal washings and aspirates are unacceptable for Xpert Xpress SARS-CoV-2/FLU/RSV  testing.  Fact Sheet for Patients: EntrepreneurPulse.com.au  Fact Sheet for Healthcare Providers: IncredibleEmployment.be  This test is not yet approved or cleared by the [place] FDA and has been authorized for detection and/or diagnosis of SARS-CoV-2 by FDA under an Emergency Use Authorization (EUA). This EUA will remain in effect (meaning this test can be used) for the duration of the COVID-19 declaration under Section 564(b)(1) of the Act, 21 U.S.C. section 360bbb-3(b)(1), unless the authorization is terminated or revoked.  Performed at [facility], [place] [address] [place], [place] [address]   Culture,  blood (Routine X 2) w Reflex to ID Panel     Status: None (Preliminary result)   Collection Time: 12/13/20  8:36 PM   Specimen: BLOOD  Result Value Ref Range Status   Specimen Description   Final    BLOOD RIGHT ARM Performed at [facility] [address], [place], [place] [address]    Special Requests   Final    BOTTLES DRAWN AEROBIC AND ANAEROBIC Blood Culture adequate volume Performed at [facility] [address] [place], [place] [address]    Culture   Final    NO GROWTH 4 DAYS Performed at [facility], [place] [address] [place], [place] [address]    Report Status PENDING  Incomplete  Culture, blood (Routine X 2) w Reflex to ID Panel     Status: None (Preliminary result)   Collection Time: 12/13/20  8:36 PM   Specimen: BLOOD  Result Value Ref Range Status   Specimen Description   Final    BLOOD LEFT ARM Performed at [facility] [address] [place], [place] [address]    Special Requests   Final    BOTTLES DRAWN AEROBIC ONLY Blood Culture adequate volume Performed at [facility] [address] [place], [place] [address]    Culture   Final    NO GROWTH 4 DAYS Performed at [facility], [place] [address] [place], [place] [address]    Report Status PENDING  Incomplete     Labs: BNP (last 3 results) No results for input(s): BNP in the last 8760 hours. Basic Metabolic Panel: Recent Labs  Lab 12/12/20 2052 12/14/20 0320 12/16/20 0324 12/17/20 0314  NA 134* 136 137 139  K 4.2 3.9 3.6 4.0  CL 99 102 102 105  CO2 '24 26 24 25  ' GLUCOSE 165* 108* 96 100*  BUN 32* 27* 25* 18  CREATININE 1.93* 1.58* 1.49* 1.40*  CALCIUM 9.4 8.9 9.0 8.9   Liver Function Tests: Recent Labs  Lab 12/12/20 2052  AST 63*  ALT 43  ALKPHOS 51  BILITOT 0.8  PROT 8.1  ALBUMIN 3.8   No results for input(s): LIPASE, AMYLASE in the last 168 hours. No results for input(s): AMMONIA in the last 168  hours. CBC: Recent Labs  Lab 12/12/20 2052 12/14/20 0320 12/16/20 0324  WBC 3.6* 2.2* 3.0*  NEUTROABS 2.2  --  1.3*  HGB 12.4* 10.8* 10.5*  HCT 39.3 34.7* 33.7*  MCV 82.2 82.8 83.2  PLT 154 138* 167   Cardiac Enzymes: No results for input(s): CKTOTAL, CKMB, CKMBINDEX, TROPONINI in the last 168 hours. BNP: Invalid input(s): POCBNP CBG: Recent Labs  Lab 12/16/20 0747 12/16/20 1151 12/16/20 1639 12/16/20 2033 12/17/20 0800  GLUCAP 106* 139* 125* 157* 112*   D-Dimer No results for input(s): DDIMER in the last 72 hours. Hgb A1c No results for input(s): HGBA1C  in the last 72 hours. Lipid Profile No results for input(s): CHOL, HDL, LDLCALC, TRIG, CHOLHDL, LDLDIRECT in the last 72 hours. Thyroid function studies No results for input(s): TSH, T4TOTAL, T3FREE, THYROIDAB in the last 72 hours.  Invalid input(s): FREET3 Anemia work up No results for input(s): VITAMINB12, FOLATE, FERRITIN, TIBC, IRON, RETICCTPCT in the last 72 hours. Urinalysis    Component Value Date/Time   COLORURINE AMBER (A) 12/12/2020 2052   APPEARANCEUR CLOUDY (A) 12/12/2020 2052   LABSPEC 1.023 12/12/2020 2052   PHURINE 5.0 12/12/2020 2052   GLUCOSEU NEGATIVE 12/12/2020 2052   HGBUR MODERATE (A) 12/12/2020 2052   BILIRUBINUR NEGATIVE 12/12/2020 2052   BILIRUBINUR moderate 07/09/2014 1428   KETONESUR NEGATIVE 12/12/2020 2052   PROTEINUR 100 (A) 12/12/2020 2052   UROBILINOGEN 0.2 08/07/2015 0854   NITRITE NEGATIVE 12/12/2020 2052   LEUKOCYTESUR NEGATIVE 12/12/2020 2052   Sepsis Labs Invalid input(s): PROCALCITONIN,  WBC,  LACTICIDVEN Microbiology Recent Results (from the past 240 hour(s))  Resp Panel by RT-PCR (Flu A&B, Covid) Nasopharyngeal Swab     Status: Abnormal   Collection Time: 12/12/20 10:05 PM   Specimen: Nasopharyngeal Swab; Nasopharyngeal(NP) swabs in vial transport medium  Result Value Ref Range Status   SARS Coronavirus 2 by RT PCR POSITIVE (A) NEGATIVE Final    Comment: CRITICAL  RESULT CALLED TO, READ BACK BY AND VERIFIED WITH: Zenon Mayo RN 12/12/2020 '@2310'  BY P.HENDERSON (NOTE) SARS-CoV-2 target nucleic acids are DETECTED.  The SARS-CoV-2 RNA is generally detectable in upper respiratory specimens during the acute phase of infection. Positive results are indicative of the presence of the identified virus, but do not rule out bacterial infection or co-infection with other pathogens not detected by the test. Clinical correlation with patient history and other diagnostic information is necessary to determine patient infection status. The expected result is Negative.  Fact Sheet for Patients: EntrepreneurPulse.com.au  Fact Sheet for Healthcare Providers: IncredibleEmployment.be  This test is not yet approved or cleared by the [place] FDA and  has been authorized for detection and/or diagnosis of SARS-CoV-2 by FDA under an Emergency Use Authorization (EUA).  This EUA will remain in effect  (meaning this test can be used) for the duration of  the COVID-19 declaration under Section 564(b)(1) of the Act, 21 U.S.C. section 360bbb-3(b)(1), unless the authorization is terminated or revoked sooner.     Influenza A by PCR NEGATIVE NEGATIVE Final   Influenza B by PCR NEGATIVE NEGATIVE Final    Comment: (NOTE) The Xpert Xpress SARS-CoV-2/FLU/RSV plus assay is intended as an aid in the diagnosis of influenza from Nasopharyngeal swab specimens and should not be used as a sole basis for treatment. Nasal washings and aspirates are unacceptable for Xpert Xpress SARS-CoV-2/FLU/RSV testing.  Fact Sheet for Patients: EntrepreneurPulse.com.au  Fact Sheet for Healthcare Providers: IncredibleEmployment.be  This test is not yet approved or cleared by the [place] FDA and has been authorized for detection and/or diagnosis of SARS-CoV-2 by FDA under an Emergency Use Authorization (EUA).  This EUA will remain in effect (meaning this test can be used) for the duration of the COVID-19 declaration under Section 564(b)(1) of the Act, 21 U.S.C. section 360bbb-3(b)(1), unless the authorization is terminated or revoked.  Performed at [facility], [place] [address] [place], [place] [address]   Culture, blood (Routine X 2) w Reflex to ID Panel     Status: None (Preliminary result)   Collection Time: 12/13/20  8:36 PM   Specimen: BLOOD  Result Value Ref  Range Status   Specimen Description   Final    BLOOD RIGHT ARM Performed at [facility] [address], [place], [place] [address]    Special Requests   Final    BOTTLES DRAWN AEROBIC AND ANAEROBIC Blood Culture adequate volume Performed at [facility] [address] [name], [place] [address]    Culture   Final    NO GROWTH 4 DAYS Performed at [facility], [place] [address] [place], [place] [address]    Report Status PENDING  Incomplete  Culture, blood (Routine X 2) w Reflex to ID Panel     Status: None (Preliminary result)   Collection Time: 12/13/20  8:36 PM   Specimen: BLOOD  Result Value Ref Range Status   Specimen Description   Final    BLOOD LEFT ARM Performed at [facility] [address] [place], [place] [address]    Special Requests   Final    BOTTLES DRAWN AEROBIC ONLY Blood Culture adequate volume Performed at [facility] [address], [place], [place] [address]    Culture   Final    NO GROWTH 4 DAYS Performed at [facility], [place] [address] [place], [place] [address]    Report Status PENDING  Incomplete     Time coordinating discharge: Over 40 minutes  SIGNED:   Charlynne Cousins, MD  [facility] 12/17/2020, 8:51 AM Pager   If 7PM-7AM, please contact night-coverage [email or web address] Password [email or web address]

## 2020-12-18 LAB — CULTURE, BLOOD (ROUTINE X 2)
Culture: NO GROWTH
Culture: NO GROWTH
Special Requests: ADEQUATE
Special Requests: ADEQUATE

## 2020-12-20 ENCOUNTER — Telehealth: Payer: Self-pay

## 2020-12-20 NOTE — Telephone Encounter (Signed)
Per daughter Karl [name]  Transition Care Management Follow-up Telephone Call  Date of discharge and from where: 12/17/2020 [name]  How have you been since you were released from the hospital? good  Any questions or concerns? No  Items Reviewed:  Did the pt receive and understand the discharge instructions provided? No   Medications obtained and verified? Yes   Other? No   Any new allergies since your discharge? No   Dietary orders reviewed? Yes  Do you have support at home? Yes   Home Care and Equipment/Supplies: Were home health services ordered? not applicable If so, what is the name of the agency? n/a  Has the agency set up a time to come to the patient's home? not applicable Were any new equipment or medical supplies ordered?  No What is the name of the medical supply agency? n/a Were you able to get the supplies/equipment? not applicable Do you have any questions related to the use of the equipment or supplies? No  Functional Questionnaire: (I = Independent and D = Dependent) ADLs: I  Bathing/Dressing- I  Meal Prep- I  Eating- I  Maintaining continence- I  Transferring/Ambulation- I  Managing Meds- I  Follow up appointments reviewed:   PCP Hospital f/u appt confirmed? Yes  Scheduled to see Dr. Lawerance Bach on 01/03/2021 @ 2:45(virtual visit).  Are transportation arrangements needed? No   If their condition worsens, is the pt aware to call PCP or go to the Emergency Dept.? Yes  Was the patient provided with contact information for the PCP's office or ED? Yes  Was to pt encouraged to call back with questions or concerns? Yes

## 2021-01-01 ENCOUNTER — Other Ambulatory Visit: Payer: Self-pay | Admitting: Internal Medicine

## 2021-01-02 NOTE — Progress Notes (Signed)
Virtual Visit via telephone Note  I connected with Karl [name] on 01/03/21 at  2:45 PM EST by telephone and verified that I am speaking with the correct person using two identifiers.   I discussed the limitations of evaluation and management by telemedicine and the availability of in person appointments. The patient expressed understanding and agreed to proceed.  Present for the visit:  Myself, Dr Cheryll Cockayne, Darnelle Going.  The patient is currently at home and I am in the office.    No referring provider.    History of Present Illness: This visit is for hospital follow up.    Admitted  1/10 - 12/17/20 for COVID-19  He went to the ED with weakness and cough.  He has chronic left sided weakness, but for 4-5 days prior to going to ED he felt increased weakness.  He was having difficulty ambulating with a walker, which he was able to do.  No numbness, change in vision.  For a few days he also had fever, chills, cough and decreased appetite.  He denied CP, SOB, Nausea. Diarrhea.  He did have covid vac x 2, no booster.   In ED his temp was 100.2, other VS normal.  oxygen sat 98-100%.  Cr 1.9.  His covid test was positive.  Flu test was neg. CXR showed no infiltrate.  Ct negative for acute infarct.   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   Energy and appetite is slowly improving.  His weakness has resolved.  He is using his walker to ambulate.  He denies fever, cough, wheeze, sob, chest pain.   He is taking all of his medications as prescribed.      Review of Systems  Constitutional: Negative for chills and fever.  HENT: Negative for congestion, ear pain, sinus pain and sore throat.   Respiratory: Negative for cough, shortness of breath and wheezing.   Cardiovascular: Negative for chest pain and palpitations.  Neurological: Positive for weakness (left leg  - chronic). Negative for dizziness and headaches.      Social History   Socioeconomic History  . Marital status: Married    Spouse name: Not on file  . Number of children: Not on file  . Years of education: Not on file  . Highest education level: Not on file  Occupational History  . Not on file  Tobacco Use  . Smoking status: Former Smoker    Packs/day: 2.00    Years: 5.00    Pack years: 10.00    Types: Cigarettes    Quit date: 12/04/2000    Years since quitting: 20.0  . Smokeless tobacco: Never Used  Vaping Use  . Vaping Use: Never used  Substance and Sexual Activity  . Alcohol use: Not Currently  . Drug use: Not Currently  . Sexual activity: Not on file  Other Topics Concern  .  Not on file  Social History Narrative   Former smoker - quit 1998 after stent -    Lives with dtr and 2 g-sons   Separated from wife in 2005, good terms   Employed with [employer] - walks at lot at work   No regular exercise   Social Determinants of Corporate investment banker Strain: Not on file  Food Insecurity: Not on file  Transportation Needs: Not on file  Physical Activity: Not on file  Stress: Not on file  Social Connections: Not on file     Observations/Objective: Appears well in NAD    MR BRAIN WO CONTRAST CLINICAL DATA:  Weakness over the last 6 days.  EXAM: MRI HEAD WITHOUT CONTRAST  TECHNIQUE: Multiplanar, multiecho  pulse sequences of the brain and surrounding structures were obtained without intravenous contrast.  COMPARISON:  06/14/2018  FINDINGS: Brain: Diffusion imaging does not show any acute or subacute infarction. There are old small vessel infarctions within both cerebellar hemispheres. No focal brainstem insult. Cerebral hemispheres show old infarction in the right thalamus. There is an old infarction in the right PCA territory affecting the posteromedial temporal lobe and occipital lobe. The study does not show chronic small-vessel ischemic changes of the white matter. No hydrocephalus or extra-axial collection.  Vascular: Major vessels at the base of the brain show flow.  Skull and upper cervical spine: Negative  Sinuses/Orbits: Clear/normal  Other: None  IMPRESSION: No acute or reversible finding. Old small vessel infarctions within both cerebellar hemispheres. Old infarction in the right PCA territory affecting the posteromedial right temporal lobe and occipital lobe and the right thalamus.  Electronically Signed   By: Paulina Fusi M.D.   On: 12/13/2020 15:22    Lab Results  Component Value Date   WBC 3.0 (L) 12/16/2020   HGB 10.5 (L) 12/16/2020   HCT 33.7 (L) 12/16/2020   PLT 167 12/16/2020   GLUCOSE 100 (H) 12/17/2020   CHOL 117 07/06/2020   TRIG 111 12/13/2020   HDL 48 07/06/2020   LDLDIRECT 135.3 06/22/2008   LDLCALC 54 07/06/2020   ALT 43 12/12/2020   AST 63 (H) 12/12/2020   NA 139 12/17/2020   K 4.0 12/17/2020   CL 105 12/17/2020   CREATININE 1.40 (H) 12/17/2020   BUN 18 12/17/2020   CO2 25 12/17/2020   TSH 0.75 06/24/2019   PSA 6.63 (H) 07/06/2020   INR 3.8 (H) 12/17/2020   HGBA1C 6.4 (H) 12/13/2020   MICROALBUR 27.6 (H) 12/25/2019    Assessment and Plan:  See Problem List for Assessment and Plan of chronic medical problems.   Follow Up Instructions:    I discussed the assessment and treatment plan with the patient. The patient was  provided an opportunity to ask questions and all were answered. The patient agreed with the plan and demonstrated an understanding of the instructions.   The patient was advised to call back or seek an in-person evaluation if the symptoms worsen or if the condition fails to improve as anticipated.  Time spent on telephone call - 15 minutes  Pincus Sanes, MD

## 2021-01-03 ENCOUNTER — Telehealth (INDEPENDENT_AMBULATORY_CARE_PROVIDER_SITE_OTHER): Payer: Medicare Other | Admitting: Internal Medicine

## 2021-01-03 ENCOUNTER — Encounter: Payer: Self-pay | Admitting: Internal Medicine

## 2021-01-03 DIAGNOSIS — U071 COVID-19: Secondary | ICD-10-CM

## 2021-01-03 DIAGNOSIS — E1159 Type 2 diabetes mellitus with other circulatory complications: Secondary | ICD-10-CM

## 2021-01-03 DIAGNOSIS — I10 Essential (primary) hypertension: Secondary | ICD-10-CM | POA: Diagnosis not present

## 2021-01-03 DIAGNOSIS — N179 Acute kidney failure, unspecified: Secondary | ICD-10-CM

## 2021-01-03 DIAGNOSIS — E785 Hyperlipidemia, unspecified: Secondary | ICD-10-CM | POA: Diagnosis not present

## 2021-01-03 NOTE — Assessment & Plan Note (Signed)
Acute S/p hospitalization - received remdisivir Doing well at home - no respiratory symptoms, localized weakness resolved - energy and appetite improving

## 2021-01-03 NOTE — Assessment & Plan Note (Signed)
Chronic BP well controlled Continue coreg 12.5 mg BID, losartan 25 mg daily   BP Readings from Last 3 Encounters:  12/17/20 114/74  09/07/20 110/80  07/06/20 126/70

## 2021-01-03 NOTE — Assessment & Plan Note (Signed)
Chronic Lab Results  Component Value Date   HGBA1C 6.4 (H) 12/13/2020   Controlled Continue metformin 1000 mg BID

## 2021-01-03 NOTE — Assessment & Plan Note (Signed)
Resolved Received IVF Metformin, losartan temporarily help - restart upon d/c

## 2021-01-03 NOTE — Assessment & Plan Note (Signed)
Chronic Continue crestor 20 mg daily Regular exercise and healthy diet encouraged  

## 2021-01-06 ENCOUNTER — Ambulatory Visit: Payer: Medicare Other | Admitting: Internal Medicine

## 2021-02-02 ENCOUNTER — Telehealth: Payer: Self-pay | Admitting: *Deleted

## 2021-02-02 ENCOUNTER — Other Ambulatory Visit: Payer: Self-pay | Admitting: Internal Medicine

## 2021-02-02 NOTE — Telephone Encounter (Signed)
Pt over due to have Inr checked. Called pt and spoke to pt's daughter and attempted to schedule appointment to have INR checked. Daughter stated that she would have to call us back to make appointment because they were having to get car fixed.

## 2021-02-04 ENCOUNTER — Telehealth: Payer: Medicare Other | Admitting: *Deleted

## 2021-02-04 NOTE — Telephone Encounter (Addendum)
Karl [name], daughter of patient called to check on status of refill. Advised daughter that patient is overdue for INR check. Daughter scheduled patient appt 02/10/21 at 11:30 am. Daughter states patient has been out of medication since 02/01/21.

## 2021-02-04 NOTE — Telephone Encounter (Addendum)
Received a message scheduler and she was able to make an appt; also will send in a 30 day supply of warfarin.

## 2021-02-04 NOTE — Telephone Encounter (Signed)
Called pt since he is overdue for his Anticoagulation Appt; pt was last seen on 11/30/2020 and was due on 01/11/21. Also, pt is pending a warfarin refill that requires monitoring. Left a message for the pt to call back to reschedule missed appt.

## 2021-02-11 ENCOUNTER — Ambulatory Visit (INDEPENDENT_AMBULATORY_CARE_PROVIDER_SITE_OTHER): Payer: Medicare Other

## 2021-02-11 ENCOUNTER — Other Ambulatory Visit: Payer: Self-pay

## 2021-02-11 DIAGNOSIS — G459 Transient cerebral ischemic attack, unspecified: Secondary | ICD-10-CM

## 2021-02-11 DIAGNOSIS — Z5181 Encounter for therapeutic drug level monitoring: Secondary | ICD-10-CM | POA: Diagnosis not present

## 2021-02-11 DIAGNOSIS — Z8673 Personal history of transient ischemic attack (TIA), and cerebral infarction without residual deficits: Secondary | ICD-10-CM | POA: Diagnosis not present

## 2021-02-11 LAB — POCT INR: INR: 1.5 — AB (ref 2.0–3.0)

## 2021-02-11 NOTE — Patient Instructions (Signed)
Description   Take an extra 1/2 tablet today, then take 1.5 tablets tomorrow, then resume same dosage of Warfarin 1 tablet daily except 1.5 tablets on Mondays and Fridays. Recheck INR in 2 weeks. Call the Coumadin clinic with any questions #[phone].

## 2021-02-25 ENCOUNTER — Other Ambulatory Visit: Payer: Self-pay

## 2021-02-25 ENCOUNTER — Ambulatory Visit (INDEPENDENT_AMBULATORY_CARE_PROVIDER_SITE_OTHER): Payer: Medicare Other

## 2021-02-25 DIAGNOSIS — Z8673 Personal history of transient ischemic attack (TIA), and cerebral infarction without residual deficits: Secondary | ICD-10-CM

## 2021-02-25 DIAGNOSIS — Z5181 Encounter for therapeutic drug level monitoring: Secondary | ICD-10-CM | POA: Diagnosis not present

## 2021-02-25 DIAGNOSIS — G459 Transient cerebral ischemic attack, unspecified: Secondary | ICD-10-CM | POA: Diagnosis not present

## 2021-02-25 LAB — POCT INR: INR: 2 (ref 2.0–3.0)

## 2021-02-25 NOTE — Patient Instructions (Signed)
Description   Take an extra 1/2 tablet today, then resume same dosage of Warfarin 1 tablet daily except 1.5 tablets on Mondays and Fridays. Recheck INR in 3 weeks. Call the Coumadin clinic with any questions #[phone].

## 2021-03-01 ENCOUNTER — Other Ambulatory Visit: Payer: Self-pay | Admitting: Internal Medicine

## 2021-03-03 ENCOUNTER — Other Ambulatory Visit: Payer: Self-pay | Admitting: Internal Medicine

## 2021-03-13 NOTE — Progress Notes (Signed)
Subjective:    Patient ID: Karl [name], male    DOB: 1956/08/10, 65 y.o.   MRN: [id number]  HPI The patient is here for an acute visit.   Soft lump on shoulder/chest - noticed it last Thursday when he was washing himself.  He never noticed this before so he is not sure if it is new or if it has been there.  He denies any trauma to the area.  There is no tenderness.  He does not think it has changed in size since he first noticed it last week.     Medications and allergies reviewed with patient and updated if appropriate.  Patient Active Problem List   Diagnosis Date Noted  . COVID-19 virus infection 12/13/2020  . AKI (acute kidney injury) ([place]) 12/13/2020  . Pneumonia due to COVID-19 virus 12/13/2020  . Weakness 12/12/2020  . Spastic hemiparesis ([place]) 07/06/2020  . 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 Shingles 06/24/2019  . Left wrist pain 12/13/2018  . 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 Chronic anticoagulation   . Acute ischemic right PCA stroke ([place]) 06/18/2018  . Encounter for therapeutic drug monitoring 02/08/2018  . Coronary artery disease 12/19/2017  . TIA (transient ischemic attack) 12/19/2017  . Hip flexor tendinitis 05/22/2017  . Pain of both hip joints 04/25/2017  . Dyslipidemia 09/01/2015  . BPH without urinary obstruction 09/01/2015  . Diabetes ([place]) 07/10/2014  . Essential hypertension 04/20/2009  . Cardiomyopathy, ischemic 04/20/2009  . Left bundle branch block 04/20/2009  . Chronic systolic heart failure ([place]) 04/20/2009    Current Outpatient Medications on File Prior to Visit  Medication Sig Dispense Refill  . acetaminophen (TYLENOL) 325 MG tablet Take 1-2 tablets (325-650 mg total) by mouth every 4 (four) hours as needed for mild pain.    . baclofen (LIORESAL) 10 MG tablet Take 1-2 tablets (10-20 mg total) by mouth 3 (three) times daily as needed for muscle spasms. 120 each 5  . blood glucose meter kit and supplies KIT  Dispense based on patient and insurance preference. Use up to four times daily as directed. (FOR E11.9). 1 each 0  . carvedilol (COREG) 12.5 MG tablet TAKE 1 TABLET BY MOUTH TWICE DAILY WITH MEALS 180 tablet 0  . Lancets 30G MISC Use to check blood sugars daily 100 each 3  . losartan (COZAAR) 25 MG tablet Take 1 tablet by mouth once daily 90 tablet 0  . metFORMIN (GLUCOPHAGE) 1000 MG tablet TAKE 1 TABLET BY MOUTH TWICE DAILY WITH A MEAL 180 tablet 0  . potassium chloride (KLOR-CON) 10 MEQ tablet Take 1 tablet by mouth twice daily 180 tablet 0  . rosuvastatin (CRESTOR) 20 MG tablet Take 1 tablet (20 mg total) by mouth daily. Please make overdue appt with Dr. Lovena Le before anymore refills. Thank you 1st attempt 15 tablet 0  . warfarin (COUMADIN) 5 MG tablet TAKE 1 TO 1 & 1/2 (ONE & ONE-HALF) TABLETS BY MOUTH ONCE DAILY AS DIRECTED BY  COUMADIN  CLINIC 35 tablet 0   No current facility-administered medications on file prior to visit.    Past Medical History:  Diagnosis Date  . Acute CVA (cerebrovascular accident) ([place]) 06/14/2018  . Coronary artery disease    blockage   Stent Dr. Lovena Le 15 years 1998  . Dyspnea   . Hypertension   . Stroke [facility] 2019   denies residual on 06/14/2018  . Type II diabetes mellitus ([place]) 07/2014 dx  Past Surgical History:  Procedure Laterality Date  . CORONARY ANGIOPLASTY WITH STENT PLACEMENT  1998  . FRACTURE SURGERY    . GREEN LIGHT LASER TURP (TRANSURETHRAL RESECTION OF PROSTATE N/A 06/05/2018   Procedure: GREEN LIGHT LASER TURP , TRANSURETHRAL RESECTION OF PROSTATE;  Surgeon: [name], MD;  Location: [facility];  Service: Urology;  Laterality: N/A;  . TIBIA FRACTURE SURGERY Left 1980s    Social History   Socioeconomic History  . Marital status: Married    Spouse name: Not on file  . Number of children: Not on file  . Years of education: Not on file  . Highest education level: Not on file  Occupational History  . Not on file  Tobacco Use  .  Smoking status: Former Smoker    Packs/day: 2.00    Years: 5.00    Pack years: 10.00    Types: Cigarettes    Quit date: 12/04/2000    Years since quitting: 20.2  . Smokeless tobacco: Never Used  Vaping Use  . Vaping Use: Never used  Substance and Sexual Activity  . Alcohol use: Not Currently  . Drug use: Not Currently  . Sexual activity: Not on file  Other Topics Concern  . Not on file  Social History Narrative   Former smoker - quit 1998 after stent -    Lives with dtr and 2 g-sons   Separated from wife in 2005, good terms   Employed with [employer] - walks at lot at work   No regular exercise   Social Determinants of Radio broadcast assistant Strain: Not on file  Food Insecurity: Not on file  Transportation Needs: Not on file  Physical Activity: Not on file  Stress: Not on file  Social Connections: Not on file    Family History  Problem Relation Age of Onset  . Diabetes Mother   . Diabetes Father   . Stroke Father 81  . Diabetes Sister   . Clotting disorder Sister 59  . Diabetes Other        nephew  . Diabetes Brother   . Diabetes Maternal Grandmother   . Colon cancer Neg Hx   . Esophageal cancer Neg Hx   . Pancreatic cancer Neg Hx   . Stomach cancer Neg Hx   . Liver disease Neg Hx     Review of Systems     Objective:   Vitals:   03/14/21 1101  BP: 112/72  Pulse: 70  Temp: 98.2 F (36.8 C)  SpO2: 97%   BP Readings from Last 3 Encounters:  03/14/21 112/72  12/17/20 114/74  09/07/20 110/80   Wt Readings from Last 3 Encounters:  12/12/20 150 lb (68 kg)  09/07/20 170 lb 12.8 oz (77.5 kg)  07/06/20 167 lb (75.8 kg)   Body mass index is 19.26 kg/m.   Physical Exam Constitutional:      General: He is not in acute distress.    Appearance: Normal appearance. He is not ill-appearing.  HENT:     Head: Normocephalic and atraumatic.  Skin:    General: Skin is warm and dry.     Comments: Ping-pong sized lump in the skin  in upper chest near right axilla.  Is soft, mobile and nontender.  No surrounding swelling or erythema.  Feels like a lipoma.  No axillary lymphadenopathy or breast lumps  Neurological:     Mental Status: He is alert.  Assessment & Plan:    See Problem List for Assessment and Plan of chronic medical problems.    This visit occurred during the SARS-CoV-2 public health emergency.  Safety protocols were in place, including screening questions prior to the visit, additional usage of staff PPE, and extensive cleaning of exam room while observing appropriate contact time as indicated for disinfecting solutions.

## 2021-03-14 ENCOUNTER — Ambulatory Visit (INDEPENDENT_AMBULATORY_CARE_PROVIDER_SITE_OTHER): Payer: Medicare Other | Admitting: Internal Medicine

## 2021-03-14 ENCOUNTER — Encounter: Payer: Self-pay | Admitting: Internal Medicine

## 2021-03-14 ENCOUNTER — Other Ambulatory Visit: Payer: Self-pay

## 2021-03-14 VITALS — BP 112/72 | HR 70 | Temp 98.2°F | Ht 74.0 in

## 2021-03-14 DIAGNOSIS — R229 Localized swelling, mass and lump, unspecified: Secondary | ICD-10-CM | POA: Diagnosis not present

## 2021-03-14 NOTE — Assessment & Plan Note (Signed)
Acute Skin lump in right upper chest near axilla-mobile, nontender-feels like a lipoma Reassured him that this is likely a lipoma and benign We will pursue an ultrasound to confirm the most likely diagnosis Discussed that he can have this removed if he wishes

## 2021-03-14 NOTE — Patient Instructions (Signed)
An ultrasound of your lump ordered.  Someone from their office will call you to schedule an appointment.      Lipoma  A lipoma is a noncancerous (benign) tumor that is made up of fat cells. This is a very common type of soft-tissue growth. Lipomas are usually found under the skin (subcutaneous). They may occur in any tissue of the body that contains fat. Common areas for lipomas to appear include the back, arms, shoulders, buttocks, and thighs. Lipomas grow slowly, and they are usually painless. Most lipomas do not cause problems and do not require treatment. What are the causes? The cause of this condition is not known. What increases the risk? You are more likely to develop this condition if:  You are 17-60 years old.  You have a family history of lipomas. What are the signs or symptoms? A lipoma usually appears as a small, round bump under the skin. In most cases, the lump will:  Feel soft or rubbery.  Not cause pain or other symptoms. However, if a lipoma is located in an area where it pushes on nerves, it can become painful or cause other symptoms. How is this diagnosed? A lipoma can usually be diagnosed with a physical exam. You may also have tests to confirm the diagnosis and to rule out other conditions. Tests may include:  Imaging tests, such as a CT scan or an MRI.  Removal of a tissue sample to be looked at under a microscope (biopsy). How is this treated? Treatment for this condition depends on the size of the lipoma and whether it is causing any symptoms.  For small lipomas that are not causing problems, no treatment is needed.  If a lipoma is bigger or it causes problems, surgery may be done to remove the lipoma. Lipomas can also be removed to improve appearance. Most often, the procedure is done after applying a medicine that numbs the area (local anesthetic).  Liposuction may be done to reduce the size of the lipoma before it is removed through surgery, or it  may be done to remove the lipoma. Lipomas are removed with this method in order to limit incision size and scarring. A liposuction tube is inserted through a small incision into the lipoma, and the contents of the lipoma are removed through the tube with suction. Follow these instructions at home:  Watch your lipoma for any changes.  Keep all follow-up visits as told by your health care provider. This is important. Contact a health care provider if:  Your lipoma becomes larger or hard.  Your lipoma becomes painful, red, or increasingly swollen. These could be signs of infection or a more serious condition. Get help right away if:  You develop tingling or numbness in an area near the lipoma. This could indicate that the lipoma is causing nerve damage. Summary  A lipoma is a noncancerous tumor that is made up of fat cells.  Most lipomas do not cause problems and do not require treatment.  If a lipoma is bigger or it causes problems, surgery may be done to remove the lipoma.  Contact a health care provider if your lipoma becomes larger or hard, or if it becomes painful, red, or increasingly swollen. Pain, redness, and swelling could be signs of infection or a more serious condition. This information is not intended to replace advice given to you by your health care provider. Make sure you discuss any questions you have with your health care provider. Document Revised: 07/07/2019  Document Reviewed: 07/07/2019 Elsevier Patient Education  2021 [employer].

## 2021-03-22 ENCOUNTER — Other Ambulatory Visit: Payer: Self-pay

## 2021-03-22 ENCOUNTER — Other Ambulatory Visit: Payer: Self-pay | Admitting: *Deleted

## 2021-03-22 ENCOUNTER — Ambulatory Visit (INDEPENDENT_AMBULATORY_CARE_PROVIDER_SITE_OTHER): Payer: Medicare Other | Admitting: *Deleted

## 2021-03-22 DIAGNOSIS — G459 Transient cerebral ischemic attack, unspecified: Secondary | ICD-10-CM | POA: Diagnosis not present

## 2021-03-22 DIAGNOSIS — Z5181 Encounter for therapeutic drug level monitoring: Secondary | ICD-10-CM | POA: Diagnosis not present

## 2021-03-22 DIAGNOSIS — Z8673 Personal history of transient ischemic attack (TIA), and cerebral infarction without residual deficits: Secondary | ICD-10-CM | POA: Diagnosis not present

## 2021-03-22 LAB — POCT INR: INR: 2.1 (ref 2.0–3.0)

## 2021-03-22 NOTE — Patient Instructions (Signed)
Description   Continue taking Warfarin 1 tablet daily except 1.5 tablets on Mondays and Fridays. Recheck INR in 4 weeks. Call the Coumadin clinic with any questions #[phone].

## 2021-03-23 MED ORDER — ROSUVASTATIN CALCIUM 20 MG PO TABS: 20.0000 mg | ORAL_TABLET | Freq: Every day | ORAL | 0 refills | Status: DC

## 2021-03-25 ENCOUNTER — Encounter: Payer: Self-pay | Admitting: Internal Medicine

## 2021-03-25 ENCOUNTER — Other Ambulatory Visit: Payer: Self-pay

## 2021-03-25 ENCOUNTER — Ambulatory Visit (INDEPENDENT_AMBULATORY_CARE_PROVIDER_SITE_OTHER): Payer: Medicare Other | Admitting: Internal Medicine

## 2021-03-25 VITALS — BP 126/66 | HR 63 | Ht 74.0 in | Wt 182.0 lb

## 2021-03-25 DIAGNOSIS — I447 Left bundle-branch block, unspecified: Secondary | ICD-10-CM

## 2021-03-25 DIAGNOSIS — I5022 Chronic systolic (congestive) heart failure: Secondary | ICD-10-CM

## 2021-03-25 DIAGNOSIS — I10 Essential (primary) hypertension: Secondary | ICD-10-CM | POA: Diagnosis not present

## 2021-03-25 MED ORDER — ROSUVASTATIN CALCIUM 20 MG PO TABS: 20.0000 mg | ORAL_TABLET | Freq: Every day | ORAL | 3 refills | Status: DC

## 2021-03-25 NOTE — Patient Instructions (Signed)

## 2021-03-25 NOTE — Progress Notes (Signed)
HPI Mr. Manna returns today for followup. He is a pleasant 65 yo man with a longstanding non-ischemic CM, chronic class 2-3 CHF, HTN, PAF, and RBBB. He has been stable. He has not had syncope. He notes a 100 lb weight loss in the last 8 years. He sustained a stroke several years ago. He denies chest pain or sob. No edema.  Allergies  Allergen Reactions  . Lipitor [Atorvastatin] Nausea And Vomiting     Current Outpatient Medications  Medication Sig Dispense Refill  . acetaminophen (TYLENOL) 325 MG tablet Take 1-2 tablets (325-650 mg total) by mouth every 4 (four) hours as needed for mild pain.    . baclofen (LIORESAL) 10 MG tablet Take 1-2 tablets (10-20 mg total) by mouth 3 (three) times daily as needed for muscle spasms. 120 each 5  . blood glucose meter kit and supplies KIT Dispense based on patient and insurance preference. Use up to four times daily as directed. (FOR E11.9). 1 each 0  . carvedilol (COREG) 12.5 MG tablet TAKE 1 TABLET BY MOUTH TWICE DAILY WITH MEALS 180 tablet 0  . Lancets 30G MISC Use to check blood sugars daily 100 each 3  . losartan (COZAAR) 25 MG tablet Take 1 tablet by mouth once daily 90 tablet 0  . metFORMIN (GLUCOPHAGE) 1000 MG tablet TAKE 1 TABLET BY MOUTH TWICE DAILY WITH A MEAL 180 tablet 0  . potassium chloride (KLOR-CON) 10 MEQ tablet Take 1 tablet by mouth twice daily 180 tablet 0  . warfarin (COUMADIN) 5 MG tablet TAKE 1 TO 1 & 1/2 (ONE & ONE-HALF) TABLETS BY MOUTH ONCE DAILY AS DIRECTED BY  COUMADIN  CLINIC 35 tablet 0  . rosuvastatin (CRESTOR) 20 MG tablet Take 1 tablet (20 mg total) by mouth daily. 90 tablet 3   No current facility-administered medications for this visit.     Past Medical History:  Diagnosis Date  . Acute CVA (cerebrovascular accident) ([place]) 06/14/2018  . Coronary artery disease    blockage   Stent Dr. Lovena Le 15 years 1998  . Dyspnea   . Hypertension   . Stroke [facility]) 2019   denies residual on 06/14/2018  . Type II  diabetes mellitus ([place]) 07/2014 dx    ROS:   All systems reviewed and negative except as noted in the HPI.   Past Surgical History:  Procedure Laterality Date  . CORONARY ANGIOPLASTY WITH STENT PLACEMENT  1998  . FRACTURE SURGERY    . GREEN LIGHT LASER TURP (TRANSURETHRAL RESECTION OF PROSTATE N/A 06/05/2018   Procedure: GREEN LIGHT LASER TURP , TRANSURETHRAL RESECTION OF PROSTATE;  Surgeon: [name], MD;  Location: [facility];  Service: Urology;  Laterality: N/A;  . TIBIA FRACTURE SURGERY Left 1980s     Family History  Problem Relation Age of Onset  . Diabetes Mother   . Diabetes Father   . Stroke Father 59  . Diabetes Sister   . Clotting disorder Sister 35  . Diabetes Other        nephew  . Diabetes Brother   . Diabetes Maternal Grandmother   . Colon cancer Neg Hx   . Esophageal cancer Neg Hx   . Pancreatic cancer Neg Hx   . Stomach cancer Neg Hx   . Liver disease Neg Hx      Social History   Socioeconomic History  . Marital status: Married    Spouse name: Not on file  . Number of children: Not on file  .  Years of education: Not on file  . Highest education level: Not on file  Occupational History  . Not on file  Tobacco Use  . Smoking status: Former Smoker    Packs/day: 2.00    Years: 5.00    Pack years: 10.00    Types: Cigarettes    Quit date: 12/04/2000    Years since quitting: 20.3  . Smokeless tobacco: Never Used  Vaping Use  . Vaping Use: Never used  Substance and Sexual Activity  . Alcohol use: Not Currently  . Drug use: Not Currently  . Sexual activity: Not on file  Other Topics Concern  . Not on file  Social History Narrative   Former smoker - quit 1998 after stent -    Lives with dtr and 2 g-sons   Separated from wife in 2005, good terms   Employed with [employer] - walks at lot at work   No regular exercise   Social Determinants of Radio broadcast assistant Strain: Not on file  Food Insecurity: Not on  file  Transportation Needs: Not on file  Physical Activity: Not on file  Stress: Not on file  Social Connections: Not on file  Intimate Partner Violence: Not on file     BP 126/66 (BP Location: Left Arm, Patient Position: Sitting, Cuff Size: Normal)   Pulse 63   Ht _0  (1.88 m)   Wt 182 lb (82.6 kg)   SpO2 97%   BMI 23.37 kg/m   Physical Exam:  Well appearing NAD HEENT: Unremarkable Neck:  No JVD, no thyromegally Lymphatics:  No adenopathy Back:  No CVA tenderness Lungs:  Clear HEART:  Regular rate rhythm, no murmurs, no rubs, no clicks Abd:  soft, positive bowel sounds, no organomegally, no rebound, no guarding Ext:  2 plus pulses, no edema, no cyanosis, no clubbing Skin:  No rashes no nodules Neuro:  CN II through XII intact, motor grossly intact  EKG - nsr   Assess/Plan: 1. Chronic systolic heart failure - his symptoms are class 2 but he is quite sedentary. He denies edema. 2. Stroke - he sustained a stroke about 3 years ago. He will continue warfarin.  3. HTN - his bp is normal now, with his 100 lbs weight loss. We will follow. 4. Obesity - this has resolved. He lost 100 lbs. 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  Carleene Overlie Laria Grimmett,MD

## 2021-03-30 ENCOUNTER — Ambulatory Visit
Admission: RE | Admit: 2021-03-30 | Discharge: 2021-03-30 | Disposition: A | Discharge status: Home / Self Care | Payer: Medicare Other | Source: Ambulatory Visit | Attending: Internal Medicine | Admitting: Internal Medicine

## 2021-03-30 DIAGNOSIS — R222 Localized swelling, mass and lump, trunk: Secondary | ICD-10-CM | POA: Diagnosis not present

## 2021-03-30 DIAGNOSIS — R229 Localized swelling, mass and lump, unspecified: Secondary | ICD-10-CM

## 2021-04-04 ENCOUNTER — Other Ambulatory Visit: Payer: Self-pay | Admitting: Internal Medicine

## 2021-04-04 NOTE — Telephone Encounter (Signed)
Prescription refill request received for warfarin Lov: Karl [name], 03/25/2021 Next INR check: 04/19/2021 Warfarin tablet strength: 5mg  daily.

## 2021-04-21 ENCOUNTER — Ambulatory Visit (INDEPENDENT_AMBULATORY_CARE_PROVIDER_SITE_OTHER): Payer: Medicare Other | Admitting: *Deleted

## 2021-04-21 ENCOUNTER — Other Ambulatory Visit: Payer: Self-pay

## 2021-04-21 DIAGNOSIS — G459 Transient cerebral ischemic attack, unspecified: Secondary | ICD-10-CM

## 2021-04-21 DIAGNOSIS — Z8673 Personal history of transient ischemic attack (TIA), and cerebral infarction without residual deficits: Secondary | ICD-10-CM

## 2021-04-21 DIAGNOSIS — Z5181 Encounter for therapeutic drug level monitoring: Secondary | ICD-10-CM | POA: Diagnosis not present

## 2021-04-21 LAB — POCT INR: INR: 2.3 (ref 2.0–3.0)

## 2021-04-21 NOTE — Patient Instructions (Signed)
Description   Continue taking Warfarin 1 tablet daily except 1.5 tablets on Mondays and Fridays. Recheck INR in 5 weeks. Call the Coumadin clinic with any questions #[phone].

## 2021-05-20 ENCOUNTER — Ambulatory Visit (INDEPENDENT_AMBULATORY_CARE_PROVIDER_SITE_OTHER): Payer: Medicare Other | Admitting: Pharmacist

## 2021-05-20 ENCOUNTER — Other Ambulatory Visit: Payer: Self-pay

## 2021-05-20 DIAGNOSIS — Z8673 Personal history of transient ischemic attack (TIA), and cerebral infarction without residual deficits: Secondary | ICD-10-CM

## 2021-05-20 DIAGNOSIS — G459 Transient cerebral ischemic attack, unspecified: Secondary | ICD-10-CM | POA: Diagnosis not present

## 2021-05-20 DIAGNOSIS — Z5181 Encounter for therapeutic drug level monitoring: Secondary | ICD-10-CM | POA: Diagnosis not present

## 2021-05-20 LAB — POCT INR: INR: 1.4 — AB (ref 2.0–3.0)

## 2021-05-20 NOTE — Patient Instructions (Signed)
Description   Take an extra tablet today and take 1.5 tablets tomorrow, then continue taking Warfarin 1 tablet daily except 1.5 tablets on Mondays and Fridays. Recheck INR in 1 week. Call the Coumadin clinic with any questions #[phone].

## 2021-05-27 ENCOUNTER — Other Ambulatory Visit: Payer: Self-pay

## 2021-05-27 ENCOUNTER — Ambulatory Visit (INDEPENDENT_AMBULATORY_CARE_PROVIDER_SITE_OTHER): Payer: Medicare Other | Admitting: Pharmacist

## 2021-05-27 DIAGNOSIS — Z5181 Encounter for therapeutic drug level monitoring: Secondary | ICD-10-CM

## 2021-05-27 DIAGNOSIS — Z8673 Personal history of transient ischemic attack (TIA), and cerebral infarction without residual deficits: Secondary | ICD-10-CM | POA: Diagnosis not present

## 2021-05-27 DIAGNOSIS — G459 Transient cerebral ischemic attack, unspecified: Secondary | ICD-10-CM

## 2021-05-27 LAB — POCT INR: INR: 2 (ref 2.0–3.0)

## 2021-05-27 NOTE — Patient Instructions (Signed)
Description   Continue taking Warfarin 1 tablet daily except 1.5 tablets on Mondays and Fridays. Recheck INR in 4 weeks. Call the Coumadin clinic with any questions #[phone].

## 2021-06-09 ENCOUNTER — Other Ambulatory Visit: Payer: Self-pay | Admitting: Internal Medicine

## 2021-06-14 ENCOUNTER — Other Ambulatory Visit: Payer: Self-pay | Admitting: Internal Medicine

## 2021-06-24 ENCOUNTER — Ambulatory Visit (INDEPENDENT_AMBULATORY_CARE_PROVIDER_SITE_OTHER): Payer: Medicare Other

## 2021-06-24 ENCOUNTER — Other Ambulatory Visit: Payer: Self-pay

## 2021-06-24 DIAGNOSIS — G459 Transient cerebral ischemic attack, unspecified: Secondary | ICD-10-CM

## 2021-06-24 DIAGNOSIS — Z8673 Personal history of transient ischemic attack (TIA), and cerebral infarction without residual deficits: Secondary | ICD-10-CM | POA: Diagnosis not present

## 2021-06-24 DIAGNOSIS — Z5181 Encounter for therapeutic drug level monitoring: Secondary | ICD-10-CM | POA: Diagnosis not present

## 2021-06-24 LAB — POCT INR: INR: 2.7 (ref 2.0–3.0)

## 2021-06-24 NOTE — Patient Instructions (Signed)
Description   Continue taking Warfarin 1 tablet daily except 1.5 tablets on Mondays and Fridays. Recheck INR in 4 weeks. Call the Coumadin clinic with any questions #[phone].     

## 2021-07-08 ENCOUNTER — Other Ambulatory Visit: Payer: Self-pay | Admitting: Internal Medicine

## 2021-07-22 ENCOUNTER — Ambulatory Visit (INDEPENDENT_AMBULATORY_CARE_PROVIDER_SITE_OTHER): Payer: Medicare Other | Admitting: *Deleted

## 2021-07-22 ENCOUNTER — Other Ambulatory Visit: Payer: Self-pay

## 2021-07-22 DIAGNOSIS — Z5181 Encounter for therapeutic drug level monitoring: Secondary | ICD-10-CM | POA: Diagnosis not present

## 2021-07-22 DIAGNOSIS — G459 Transient cerebral ischemic attack, unspecified: Secondary | ICD-10-CM

## 2021-07-22 DIAGNOSIS — Z8673 Personal history of transient ischemic attack (TIA), and cerebral infarction without residual deficits: Secondary | ICD-10-CM | POA: Diagnosis not present

## 2021-07-22 LAB — POCT INR: INR: 2.7 (ref 2.0–3.0)

## 2021-07-22 MED ORDER — WARFARIN SODIUM 5 MG PO TABS: ORAL_TABLET | ORAL | 1 refills | Status: DC

## 2021-07-22 NOTE — Patient Instructions (Signed)
Description   Continue taking Warfarin 1 tablet daily except 1.5 tablets on Mondays and Fridays. Recheck INR in 5 weeks. Call the Coumadin clinic with any questions #[phone].     

## 2021-08-26 ENCOUNTER — Other Ambulatory Visit: Payer: Self-pay

## 2021-08-26 ENCOUNTER — Ambulatory Visit: Payer: Medicare Other

## 2021-08-26 DIAGNOSIS — Z8673 Personal history of transient ischemic attack (TIA), and cerebral infarction without residual deficits: Secondary | ICD-10-CM | POA: Diagnosis not present

## 2021-08-26 DIAGNOSIS — G459 Transient cerebral ischemic attack, unspecified: Secondary | ICD-10-CM

## 2021-08-26 DIAGNOSIS — Z5181 Encounter for therapeutic drug level monitoring: Secondary | ICD-10-CM | POA: Diagnosis not present

## 2021-08-26 LAB — POCT INR: INR: 2.9 (ref 2.0–3.0)

## 2021-08-26 NOTE — Patient Instructions (Signed)
Description   Continue taking Warfarin 1 tablet daily except 1.5 tablets on Mondays and Fridays. Recheck INR in 6 weeks. Call the Coumadin clinic with any questions #[phone].

## 2021-09-12 DIAGNOSIS — D649 Anemia, unspecified: Secondary | ICD-10-CM | POA: Insufficient documentation

## 2021-09-12 NOTE — Progress Notes (Signed)
Subjective:    Patient ID: Karl [name], male    DOB: 1956-01-31, 65 y.o.   MRN: [id number]  This visit occurred during the SARS-CoV-2 public health emergency.  Safety protocols were in place, including screening questions prior to the visit, additional usage of staff PPE, and extensive cleaning of exam room while observing appropriate contact time as indicated for disinfecting solutions.     HPI The patient is here for follow up of their chronic medical problems, including CAD, CHF, htn, DM, hld, h/o cva with left sided weakness  He has no concerns.  He is taking his medication daily as prescribed.  Medications and allergies reviewed with patient and updated if appropriate.  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 07/22/2018   Chronic anticoagulation    Acute ischemic right PCA stroke ([place]) 06/18/2018   Encounter for therapeutic drug monitoring 02/08/2018   Coronary artery disease 12/19/2017   TIA (transient ischemic attack) 12/19/2017   Hip flexor tendinitis 05/22/2017   Pain of both hip joints 04/25/2017   Dyslipidemia 09/01/2015   BPH without urinary obstruction 09/01/2015   Diabetes ([place]) 07/10/2014   Essential hypertension 04/20/2009   Cardiomyopathy, ischemic 04/20/2009   Left bundle branch block 03/83/3383   Chronic systolic heart failure ([place]) 04/20/2009    Current Outpatient Medications on File Prior to Visit  Medication Sig Dispense Refill   acetaminophen (TYLENOL) 325 MG tablet Take 1-2 tablets (325-650 mg total) by mouth every 4 (four) hours as needed for mild pain.     blood glucose meter kit and supplies KIT Dispense based on patient and  insurance preference. Use up to four times daily as directed. (FOR E11.9). 1 each 0   Lancets 30G MISC Use to check blood sugars daily 100 each 3   rosuvastatin (CRESTOR) 20 MG tablet Take 1 tablet by mouth once daily 90 tablet 2   warfarin (COUMADIN) 5 MG tablet TAKE 1 TO 1 & 1/2 (ONE TO ONE & ONE-HALF) TABLETS BY MOUTH ONCE DAILY AS  DIRECTED  BY  COUMADIN  CLINIC 40 tablet 1   No current facility-administered medications on file prior to visit.    Past Medical History:  Diagnosis Date   Acute CVA (cerebrovascular accident) ([place]) 06/14/2018   Coronary artery disease    blockage   Stent Dr. Lovena Le 15 years 1998   Dyspnea    Hypertension    Stroke [facility]) 2019   denies residual on 06/14/2018   Type II diabetes mellitus ([place]) 07/2014 dx    Past Surgical History:  Procedure Laterality Date   CORONARY ANGIOPLASTY WITH STENT PLACEMENT  1998   FRACTURE SURGERY     GREEN LIGHT LASER TURP (TRANSURETHRAL RESECTION OF PROSTATE N/A 06/05/2018   Procedure: GREEN LIGHT LASER TURP , TRANSURETHRAL RESECTION OF PROSTATE;  Surgeon: [name], MD;  Location: [facility];  Service: Urology;  Laterality: N/A;   TIBIA FRACTURE SURGERY Left 1980s    Social History   Socioeconomic History   Marital status: Married    Spouse name: Not on file   Number of children: Not on file   Years of  education: Not on file   Highest education level: Not on file  Occupational History   Not on file  Tobacco Use   Smoking status: Former    Packs/day: 2.00    Years: 5.00    Pack years: 10.00    Types: Cigarettes    Quit date: 12/04/2000    Years since quitting: 20.7   Smokeless tobacco: Never  Vaping Use   Vaping Use: Never used  Substance and Sexual Activity   Alcohol use: Not Currently   Drug use: Not Currently   Sexual activity: Not on file  Other Topics Concern   Not on file  Social History Narrative   Former smoker - quit 1998 after stent -    Lives with dtr and 2 g-sons   Separated from wife in  2005, good terms   Employed with [employer] - walks at lot at work   No regular exercise   Social Determinants of Radio broadcast assistant Strain: Not on file  Food Insecurity: Not on file  Transportation Needs: Not on file  Physical Activity: Not on file  Stress: Not on file  Social Connections: Not on file    Family History  Problem Relation Age of Onset   Diabetes Mother    Diabetes Father    Stroke Father 62   Diabetes Sister    Clotting disorder Sister 47   Diabetes Other        nephew   Diabetes Brother    Diabetes Maternal Grandmother    Colon cancer Neg Hx    Esophageal cancer Neg Hx    Pancreatic cancer Neg Hx    Stomach cancer Neg Hx    Liver disease Neg Hx     Review of Systems  Constitutional:  Negative for fever.  Respiratory:  Negative for cough, shortness of breath and wheezing.   Cardiovascular:  Negative for chest pain, palpitations and leg swelling.  Gastrointestinal:  Negative for abdominal pain, blood in stool (No black stool) and nausea.  Neurological:  Negative for dizziness, light-headedness and headaches.      Objective:   Vitals:   09/13/21 1129  BP: 102/64  Pulse: 65  Temp: 98.5 F (36.9 C)  SpO2: 98%   BP Readings from Last 3 Encounters:  09/13/21 102/64  03/25/21 126/66  03/14/21 112/72   Wt Readings from Last 3 Encounters:  03/25/21 182 lb (82.6 kg)  12/12/20 150 lb (68 kg)  09/07/20 170 lb 12.8 oz (77.5 kg)   Body mass index is 23.37 kg/m.   Physical Exam    Constitutional: Appears well-developed and well-nourished. No distress.  HENT:  Head: Normocephalic and atraumatic.  Neck: Neck supple. No tracheal deviation present. No thyromegaly present.  No cervical lymphadenopathy Cardiovascular: Normal rate, regular rhythm and normal heart sounds.   No murmur heard. No carotid bruit .  No edema Pulmonary/Chest: Effort normal and breath sounds normal. No respiratory distress. No has no wheezes. No  rales.  Skin: Skin is warm and dry. Not diaphoretic.  Psychiatric: Normal mood and affect. Behavior is normal.      Assessment & Plan:    See Problem List for Assessment and Plan of chronic medical problems.

## 2021-09-12 NOTE — Patient Instructions (Addendum)
    Blood work was ordered.     Medications changes include :  none   Your prescription(s) have been submitted to your pharmacy. Please take as directed and contact our office if you believe you are having problem(s) with the medication(s).   Please followup in 6 months  

## 2021-09-13 ENCOUNTER — Other Ambulatory Visit: Payer: Self-pay

## 2021-09-13 ENCOUNTER — Encounter: Payer: Self-pay | Admitting: Internal Medicine

## 2021-09-13 ENCOUNTER — Ambulatory Visit (INDEPENDENT_AMBULATORY_CARE_PROVIDER_SITE_OTHER): Payer: Medicare Other | Admitting: Internal Medicine

## 2021-09-13 VITALS — BP 102/64 | HR 65 | Temp 98.5°F | Ht 74.0 in

## 2021-09-13 DIAGNOSIS — D649 Anemia, unspecified: Secondary | ICD-10-CM | POA: Diagnosis not present

## 2021-09-13 DIAGNOSIS — E1159 Type 2 diabetes mellitus with other circulatory complications: Secondary | ICD-10-CM | POA: Diagnosis not present

## 2021-09-13 DIAGNOSIS — G8110 Spastic hemiplegia affecting unspecified side: Secondary | ICD-10-CM

## 2021-09-13 DIAGNOSIS — I2510 Atherosclerotic heart disease of native coronary artery without angina pectoris: Secondary | ICD-10-CM | POA: Diagnosis not present

## 2021-09-13 DIAGNOSIS — E785 Hyperlipidemia, unspecified: Secondary | ICD-10-CM | POA: Diagnosis not present

## 2021-09-13 DIAGNOSIS — I69998 Other sequelae following unspecified cerebrovascular disease: Secondary | ICD-10-CM

## 2021-09-13 DIAGNOSIS — R531 Weakness: Secondary | ICD-10-CM | POA: Diagnosis not present

## 2021-09-13 DIAGNOSIS — I10 Essential (primary) hypertension: Secondary | ICD-10-CM | POA: Diagnosis not present

## 2021-09-13 DIAGNOSIS — I5022 Chronic systolic (congestive) heart failure: Secondary | ICD-10-CM

## 2021-09-13 LAB — COMPREHENSIVE METABOLIC PANEL
ALT: 15 U/L (ref 0–53)
AST: 20 U/L (ref 0–37)
Albumin: 4 g/dL (ref 3.5–5.2)
Alkaline Phosphatase: 54 U/L (ref 39–117)
BUN: 13 mg/dL (ref 6–23)
CO2: 30 mEq/L (ref 19–32)
Calcium: 9.8 mg/dL (ref 8.4–10.5)
Chloride: 104 mEq/L (ref 96–112)
Creatinine, Ser: 1.47 mg/dL (ref 0.40–1.50)
GFR: 49.24 mL/min — ABNORMAL LOW (ref >60.00)
Glucose, Bld: 109 mg/dL — ABNORMAL HIGH (ref 70–99)
Potassium: 5 mEq/L (ref 3.5–5.1)
Sodium: 138 mEq/L (ref 135–145)
Total Bilirubin: 0.5 mg/dL (ref 0.2–1.2)
Total Protein: 7.3 g/dL (ref 6.0–8.3)

## 2021-09-13 LAB — CBC WITH DIFFERENTIAL/PLATELET
Basophils Absolute: 0 10*3/uL (ref 0.0–0.1)
Basophils Relative: 0.7 % (ref 0.0–3.0)
Eosinophils Absolute: 0.3 10*3/uL (ref 0.0–0.7)
Eosinophils Relative: 7.4 % — ABNORMAL HIGH (ref 0.0–5.0)
HCT: 37.9 % — ABNORMAL LOW (ref 39.0–52.0)
Hemoglobin: 12 g/dL — ABNORMAL LOW (ref 13.0–17.0)
Lymphocytes Relative: 17.4 % (ref 12.0–46.0)
Lymphs Abs: 0.7 10*3/uL (ref 0.7–4.0)
MCHC: 31.7 g/dL (ref 30.0–36.0)
MCV: 78.5 fl (ref 78.0–100.0)
Monocytes Absolute: 0.5 10*3/uL (ref 0.1–1.0)
Monocytes Relative: 13.8 % — ABNORMAL HIGH (ref 3.0–12.0)
Neutro Abs: 2.3 10*3/uL (ref 1.4–7.7)
Neutrophils Relative %: 60.7 % (ref 43.0–77.0)
Platelets: 210 10*3/uL (ref 150.0–400.0)
RBC: 4.82 Mil/uL (ref 4.22–5.81)
RDW: 15.1 % (ref 11.5–15.5)
WBC: 3.9 10*3/uL — ABNORMAL LOW (ref 4.0–10.5)

## 2021-09-13 LAB — LIPID PANEL
Cholesterol: 129 mg/dL (ref 0–200)
HDL: 56.4 mg/dL (ref >39.00)
LDL Cholesterol: 58 mg/dL (ref 0–99)
NonHDL: 72.76
Total CHOL/HDL Ratio: 2
Triglycerides: 72 mg/dL (ref 0.0–149.0)
VLDL: 14.4 mg/dL (ref 0.0–40.0)

## 2021-09-13 LAB — HEMOGLOBIN A1C: Hgb A1c MFr Bld: 6.7 % — ABNORMAL HIGH (ref 4.6–6.5)

## 2021-09-13 MED ORDER — METFORMIN HCL 1000 MG PO TABS: 1000.0000 mg | ORAL_TABLET | Freq: Two times a day (BID) | ORAL | 2 refills | Status: DC

## 2021-09-13 MED ORDER — POTASSIUM CHLORIDE CRYS ER 10 MEQ PO TBCR: 10.0000 meq | EXTENDED_RELEASE_TABLET | Freq: Two times a day (BID) | ORAL | 2 refills | Status: DC

## 2021-09-13 MED ORDER — CARVEDILOL 12.5 MG PO TABS: ORAL_TABLET | ORAL | 2 refills | Status: DC

## 2021-09-13 MED ORDER — LOSARTAN POTASSIUM 25 MG PO TABS: 25.0000 mg | ORAL_TABLET | Freq: Every day | ORAL | 2 refills | Status: DC

## 2021-09-13 NOTE — Assessment & Plan Note (Signed)
Chronic Blood pressure well controlled CMP Continue Coreg 12.5 mg twice daily, losartan 25 mg daily

## 2021-09-13 NOTE — Assessment & Plan Note (Signed)
Uncontrolled Continue metformin 1000 mg twice daily Lab Results  Component Value Date   HGBA1C 6.4 (H) 12/13/2020   Sugars have been well controlled A1c today

## 2021-09-13 NOTE — Assessment & Plan Note (Addendum)
Chronic He denies any symptoms suggestive of GI bleed Check CBC, iron panel

## 2021-09-13 NOTE — Assessment & Plan Note (Signed)
Chronic Regular exercise and healthy diet encouraged Check lipid panel  Continue Crestor 20 mg daily 

## 2021-09-13 NOTE — Assessment & Plan Note (Signed)
Chronic Chronic left-sided weakness as late effect of CVA Does have some spasticity-baclofen 10-20 mg 3 times daily as needed No change in weakness Continue warfarin daily, rosuvastatin 20 mg daily Stressed regular exercise

## 2021-09-13 NOTE — Assessment & Plan Note (Signed)
Chronic Mild No longer taking baclofen

## 2021-09-13 NOTE — Assessment & Plan Note (Signed)
Chronic Euvolemic on exam Following with cardiology Continue Coreg 12.5 mg daily

## 2021-09-13 NOTE — Assessment & Plan Note (Signed)
Chronic No symptoms consistent with angina Continue Coreg 12.5 mg daily, losartan 25 mg daily, warfarin as instructed daily

## 2021-09-14 LAB — IRON,TIBC AND FERRITIN PANEL
%SAT: 25 % (calc) (ref 20–48)
Ferritin: 124 ng/mL (ref 24–380)
Iron: 64 ug/dL (ref 50–180)
TIBC: 259 mcg/dL (calc) (ref 250–425)

## 2021-10-03 ENCOUNTER — Other Ambulatory Visit: Payer: Self-pay | Admitting: Internal Medicine

## 2021-10-03 ENCOUNTER — Telehealth: Payer: Self-pay | Admitting: Internal Medicine

## 2021-10-03 NOTE — Telephone Encounter (Signed)
Left message for patient to call me back at [phone] to schedule Medicare Annual Wellness Visit   No hx of AWV eligible as of 09/03/20  Please schedule at anytime with [facility] Advisor if patient calls the office back.    40 Minutes appointment   Any questions, please call me at [phone]  

## 2021-10-11 ENCOUNTER — Other Ambulatory Visit: Payer: Self-pay

## 2021-10-11 ENCOUNTER — Ambulatory Visit: Payer: Medicare Other | Admitting: *Deleted

## 2021-10-11 DIAGNOSIS — Z5181 Encounter for therapeutic drug level monitoring: Secondary | ICD-10-CM

## 2021-10-11 DIAGNOSIS — G459 Transient cerebral ischemic attack, unspecified: Secondary | ICD-10-CM | POA: Diagnosis not present

## 2021-10-11 DIAGNOSIS — Z8673 Personal history of transient ischemic attack (TIA), and cerebral infarction without residual deficits: Secondary | ICD-10-CM

## 2021-10-11 LAB — POCT INR: INR: 1.9 — AB (ref 2.0–3.0)

## 2021-10-11 NOTE — Patient Instructions (Signed)
Description   Today take 1.5 tablets then continue taking Warfarin 1 tablet daily except 1.5 tablets on Mondays and Fridays. Recheck INR in 5 weeks. Call the Coumadin clinic with any questions #[phone].

## 2021-11-21 ENCOUNTER — Ambulatory Visit (INDEPENDENT_AMBULATORY_CARE_PROVIDER_SITE_OTHER): Payer: Medicare Other

## 2021-11-21 ENCOUNTER — Other Ambulatory Visit: Payer: Self-pay

## 2021-11-21 DIAGNOSIS — Z5181 Encounter for therapeutic drug level monitoring: Secondary | ICD-10-CM | POA: Diagnosis not present

## 2021-11-21 DIAGNOSIS — G459 Transient cerebral ischemic attack, unspecified: Secondary | ICD-10-CM | POA: Diagnosis not present

## 2021-11-21 DIAGNOSIS — Z8673 Personal history of transient ischemic attack (TIA), and cerebral infarction without residual deficits: Secondary | ICD-10-CM | POA: Diagnosis not present

## 2021-11-21 LAB — POCT INR: INR: 2.3 (ref 2.0–3.0)

## 2021-11-21 NOTE — Patient Instructions (Signed)
-   continue taking Warfarin 1 tablet daily except 1.5 tablets on Mondays and Fridays.  - Recheck INR in 6 weeks.  Call the Coumadin clinic with any questions #[phone].

## 2021-12-15 ENCOUNTER — Other Ambulatory Visit: Payer: Self-pay | Admitting: Internal Medicine

## 2021-12-15 NOTE — Telephone Encounter (Signed)
Prescription refill request received for warfarin Lov: 03/25/21 Karl [name])  Next INR check: 01/02/22 Warfarin tablet strength: 5mg   Appropriate dose and refill sent to requested pharmacy.

## 2022-01-02 ENCOUNTER — Ambulatory Visit (INDEPENDENT_AMBULATORY_CARE_PROVIDER_SITE_OTHER): Payer: Medicare Other

## 2022-01-02 ENCOUNTER — Other Ambulatory Visit: Payer: Self-pay

## 2022-01-02 DIAGNOSIS — Z8673 Personal history of transient ischemic attack (TIA), and cerebral infarction without residual deficits: Secondary | ICD-10-CM

## 2022-01-02 DIAGNOSIS — Z5181 Encounter for therapeutic drug level monitoring: Secondary | ICD-10-CM | POA: Diagnosis not present

## 2022-01-02 DIAGNOSIS — G459 Transient cerebral ischemic attack, unspecified: Secondary | ICD-10-CM | POA: Diagnosis not present

## 2022-01-02 LAB — POCT INR: INR: 2.3 (ref 2.0–3.0)

## 2022-01-02 NOTE — Patient Instructions (Signed)
Description   - continue taking Warfarin 1 tablet daily except 1.5 tablets on Mondays and Fridays.  - Recheck INR in 6 weeks.  Call the Coumadin clinic with any questions [phone].

## 2022-01-23 ENCOUNTER — Other Ambulatory Visit: Payer: Self-pay | Admitting: Internal Medicine

## 2022-02-13 ENCOUNTER — Ambulatory Visit (INDEPENDENT_AMBULATORY_CARE_PROVIDER_SITE_OTHER): Payer: Medicare Other | Admitting: *Deleted

## 2022-02-13 ENCOUNTER — Other Ambulatory Visit: Payer: Self-pay

## 2022-02-13 DIAGNOSIS — Z5181 Encounter for therapeutic drug level monitoring: Secondary | ICD-10-CM | POA: Diagnosis not present

## 2022-02-13 DIAGNOSIS — G459 Transient cerebral ischemic attack, unspecified: Secondary | ICD-10-CM

## 2022-02-13 DIAGNOSIS — Z8673 Personal history of transient ischemic attack (TIA), and cerebral infarction without residual deficits: Secondary | ICD-10-CM

## 2022-02-13 LAB — POCT INR: INR: 1.7 — AB (ref 2.0–3.0)

## 2022-02-13 NOTE — Patient Instructions (Signed)
Description   ?- Take 2 tablets of warfarin today, then continue taking Warfarin 1 tablet daily except 1.5 tablets on Mondays and Fridays.  ?- Recheck INR in 2 weeks.  ?Call the Coumadin clinic with any questions #[phone]. ?  ? ? ?

## 2022-02-27 ENCOUNTER — Ambulatory Visit (INDEPENDENT_AMBULATORY_CARE_PROVIDER_SITE_OTHER): Payer: Medicare Other

## 2022-02-27 ENCOUNTER — Other Ambulatory Visit: Payer: Self-pay

## 2022-02-27 DIAGNOSIS — G459 Transient cerebral ischemic attack, unspecified: Secondary | ICD-10-CM

## 2022-02-27 DIAGNOSIS — Z8673 Personal history of transient ischemic attack (TIA), and cerebral infarction without residual deficits: Secondary | ICD-10-CM | POA: Diagnosis not present

## 2022-02-27 DIAGNOSIS — Z5181 Encounter for therapeutic drug level monitoring: Secondary | ICD-10-CM | POA: Diagnosis not present

## 2022-02-27 LAB — POCT INR: INR: 2.7 (ref 2.0–3.0)

## 2022-02-27 NOTE — Patient Instructions (Signed)
Description   ?Continue taking Warfarin 1 tablet daily except 1.5 tablets on Mondays and Fridays. Recheck INR in 4 weeks. Call the Coumadin clinic with any questions [phone]. ?  ?  ?

## 2022-03-16 ENCOUNTER — Ambulatory Visit: Payer: Medicare Other | Admitting: Internal Medicine

## 2022-03-19 ENCOUNTER — Encounter: Payer: Self-pay | Admitting: Internal Medicine

## 2022-03-19 NOTE — Patient Instructions (Addendum)
? ? ? ?  Blood work was ordered.   ? ? ?Medications changes include :    ? ? ?Your prescription(s) have been sent to your pharmacy.  ? ? ?A referral was ordered for XX.     Someone from that office will call you to schedule an appointment.  ? ? ?Return in about 6 months (around 09/19/2022) for follow up. ? ?

## 2022-03-19 NOTE — Progress Notes (Signed)
? ? ? ? ?  Subjective:  ? ? Patient ID: Karl [name], male    DOB: [birth year], 66 y.o.   MRN: [id number] ? ?This visit occurred during the SARS-CoV-2 public health emergency.  Safety protocols were in place, including screening questions prior to the visit, additional usage of staff PPE, and extensive cleaning of exam room while observing appropriate contact time as indicated for disinfecting solutions.   ? ? ?HPI ?[name] is here for follow up of his chronic medical problems, including  CAD, HFrEF, htn, DM, hld, h/o CVA w/ left sided weakness ? ? ?Start Jardiance ? ?Medications and allergies reviewed with patient and updated if appropriate. ? ?Current Outpatient Medications on File Prior to Visit  ?Medication Sig Dispense Refill  ? acetaminophen (TYLENOL) 325 MG tablet Take 1-2 tablets (325-650 mg total) by mouth every 4 (four) hours as needed for mild pain.    ? blood glucose meter kit and supplies KIT Dispense based on patient and insurance preference. Use up to four times daily as directed. (FOR E11.9). 1 each 0  ? carvedilol (COREG) 12.5 MG tablet TAKE 1 TABLET BY MOUTH TWICE DAILY WITH MEALS 180 tablet 2  ? Lancets 30G MISC Use to check blood sugars daily 100 each 3  ? losartan (COZAAR) 25 MG tablet Take 1 tablet (25 mg total) by mouth daily. 90 tablet 2  ? metFORMIN (GLUCOPHAGE) 1000 MG tablet Take 1 tablet (1,000 mg total) by mouth 2 (two) times daily with a meal. 180 tablet 2  ? potassium chloride (KLOR-CON) 10 MEQ tablet Take 1 tablet (10 mEq total) by mouth 2 (two) times daily. 180 tablet 2  ? rosuvastatin (CRESTOR) 20 MG tablet Take 1 tablet by mouth once daily 90 tablet 2  ? warfarin (COUMADIN) 5 MG tablet TAKE ONE TO ONE AND ONE HALF TABLETS BY MOUTH ONCE DAILY AS DIRECTED BY COUMADIN CLINIC 40 tablet 1  ? ?No current facility-administered medications on file prior to visit.  ? ? ? ?Review of Systems ? ?   ?Objective:  ?There were no vitals filed for this visit. ?BP Readings from Last 3 Encounters:   ?09/13/21 102/64  ?03/25/21 126/66  ?03/14/21 112/72  ? ?Wt Readings from Last 3 Encounters:  ?03/25/21 182 lb (82.6 kg)  ?12/12/20 150 lb (68 kg)  ?09/07/20 170 lb 12.8 oz (77.5 kg)  ? ?There is no height or weight on file to calculate BMI. ? ?  ?Physical Exam ?   ? ?Lab Results  ?Component Value Date  ? WBC 3.9 (L) 09/13/2021  ? HGB 12.0 (L) 09/13/2021  ? HCT 37.9 (L) 09/13/2021  ? PLT 210.0 09/13/2021  ? GLUCOSE 109 (H) 09/13/2021  ? CHOL 129 09/13/2021  ? TRIG 72.0 09/13/2021  ? HDL 56.40 09/13/2021  ? LDLDIRECT 135.3 06/22/2008  ? [place] 58 09/13/2021  ? ALT 15 09/13/2021  ? AST 20 09/13/2021  ? NA 138 09/13/2021  ? K 5.0 09/13/2021  ? CL 104 09/13/2021  ? CREATININE 1.47 09/13/2021  ? BUN 13 09/13/2021  ? CO2 30 09/13/2021  ? TSH 0.75 06/24/2019  ? PSA 6.63 (H) 07/06/2020  ? INR 2.7 02/27/2022  ? HGBA1C 6.7 (H) 09/13/2021  ? MICROALBUR 27.6 (H) 12/25/2019  ? ? ? ?Assessment & Plan:  ? ? ?See Problem List for Assessment and Plan of chronic medical problems.  ? ?This encounter was created in error - please disregard. ?

## 2022-03-20 ENCOUNTER — Encounter: Payer: Medicare Other | Admitting: Internal Medicine

## 2022-03-20 DIAGNOSIS — R531 Weakness: Secondary | ICD-10-CM

## 2022-03-20 DIAGNOSIS — I2510 Atherosclerotic heart disease of native coronary artery without angina pectoris: Secondary | ICD-10-CM

## 2022-03-20 DIAGNOSIS — E1159 Type 2 diabetes mellitus with other circulatory complications: Secondary | ICD-10-CM

## 2022-03-20 DIAGNOSIS — I5022 Chronic systolic (congestive) heart failure: Secondary | ICD-10-CM

## 2022-03-20 DIAGNOSIS — E785 Hyperlipidemia, unspecified: Secondary | ICD-10-CM

## 2022-03-20 DIAGNOSIS — I10 Essential (primary) hypertension: Secondary | ICD-10-CM

## 2022-03-20 NOTE — Assessment & Plan Note (Signed)
Chronic Regular exercise and healthy diet encouraged Check lipid panel  Continue Crestor 20 mg daily 

## 2022-03-20 NOTE — Assessment & Plan Note (Signed)
Chronic ?No symptoms consistent with angina ?Following with cardiology ?Continue Coreg 12.5 mg daily, losartan 25 mg daily, rosuvastatin 20 mg daily, and warfarin ?

## 2022-03-20 NOTE — Assessment & Plan Note (Addendum)
Chronic ?Lab Results  ?Component Value Date  ? HGBA1C 6.7 (H) 09/13/2021  ? ?Sugars controlled ?Check A1c, urine microalbumin today ?Continue metformin 1000 mg twice daily ?Start Jardiance 10 mg daily ?Stressed regular exercise, diabetic diet ? ? ?

## 2022-03-20 NOTE — Assessment & Plan Note (Signed)
Chronic ?Blood pressure well controlled ?CMP ?Continue Coreg 12.5 mg daily, losartan 25 mg daily ?

## 2022-03-20 NOTE — Assessment & Plan Note (Signed)
Chronic ?Chronic left-sided weakness ?Stressed regular exercise ?Continue warfarin per Coumadin clinic, rosuvastatin 20 mg daily, losartan 25 mg daily and carvedilol 12.5 mg daily ?

## 2022-03-20 NOTE — Assessment & Plan Note (Signed)
Chronic ?Following with cardiology ?Appears euvolemic ?Continue Coreg 12.5 mg twice daily losartan 25 daily ?

## 2022-03-26 NOTE — Progress Notes (Signed)
? ? ? ? ?Subjective:  ? ? Patient ID: Karl [name], male    DOB: Apr 04, 1956, 66 y.o.   MRN: [id number] ? ?This visit occurred during the SARS-CoV-2 public health emergency.  Safety protocols were in place, including screening questions prior to the visit, additional usage of staff PPE, and extensive cleaning of exam room while observing appropriate contact time as indicated for disinfecting solutions.   ? ? ?HPI ?Karl [name] is here for follow up of his chronic medical problems, including  CAD, HFrEF, htn, DM, hld, h/o CVA w/ left sided weakness ? ?He is taking his medication as prescribed.  Denies any problems with them.  He is exercising.  He states he is eating fairly healthy.  He has no concerns except for his left hand weakness which is a result of the stroke. ? ?Medications and allergies reviewed with patient and updated if appropriate. ? ?Current Outpatient Medications on File Prior to Visit  ?Medication Sig Dispense Refill  ? acetaminophen (TYLENOL) 325 MG tablet Take 1-2 tablets (325-650 mg total) by mouth every 4 (four) hours as needed for mild pain.    ? blood glucose meter kit and supplies KIT Dispense based on patient and insurance preference. Use up to four times daily as directed. (FOR E11.9). 1 each 0  ? carvedilol (COREG) 12.5 MG tablet TAKE 1 TABLET BY MOUTH TWICE DAILY WITH MEALS 180 tablet 2  ? Lancets 30G MISC Use to check blood sugars daily 100 each 3  ? losartan (COZAAR) 25 MG tablet Take 1 tablet (25 mg total) by mouth daily. 90 tablet 2  ? metFORMIN (GLUCOPHAGE) 1000 MG tablet Take 1 tablet (1,000 mg total) by mouth 2 (two) times daily with a meal. 180 tablet 2  ? potassium chloride (KLOR-CON) 10 MEQ tablet Take 1 tablet (10 mEq total) by mouth 2 (two) times daily. 180 tablet 2  ? rosuvastatin (CRESTOR) 20 MG tablet Take 1 tablet by mouth once daily 90 tablet 2  ? warfarin (COUMADIN) 5 MG tablet TAKE ONE TO ONE AND ONE HALF TABLETS BY MOUTH ONCE DAILY AS DIRECTED BY COUMADIN CLINIC 40 tablet 1   ? ?No current facility-administered medications on file prior to visit.  ? ? ? ?Review of Systems  ?Constitutional:  Negative for chills and fever.  ?Respiratory:  Negative for cough, shortness of breath and wheezing.   ?Cardiovascular:  Negative for chest pain, palpitations and leg swelling.  ?Gastrointestinal:  Negative for abdominal pain, blood in stool, constipation, diarrhea and nausea.  ?     Occasional gerd  ?Neurological:  Positive for headaches (occ, transient). Negative for light-headedness.  ? ?   ?Objective:  ? ?Vitals:  ? 03/27/22 1334  ?BP: 140/70  ?Pulse: 60  ?Temp: 97.8 ?F (36.6 ?C)  ?SpO2: 96%  ? ?BP Readings from Last 3 Encounters:  ?03/27/22 140/70  ?09/13/21 102/64  ?03/25/21 126/66  ? ?Wt Readings from Last 3 Encounters:  ?03/27/22 176 lb (79.8 kg)  ?03/25/21 182 lb (82.6 kg)  ?12/12/20 150 lb (68 kg)  ? ?Body mass index is 22.6 kg/m?. ? ?  ?Physical Exam ?Constitutional:   ?   General: He is not in acute distress. ?   Appearance: Normal appearance. He is not ill-appearing.  ?HENT:  ?   Head: Normocephalic and atraumatic.  ?Eyes:  ?   Conjunctiva/sclera: Conjunctivae normal.  ?Cardiovascular:  ?   Rate and Rhythm: Normal rate and regular rhythm.  ?   Heart sounds: Normal heart sounds. No murmur  heard. ?Pulmonary:  ?   Effort: Pulmonary effort is normal. No respiratory distress.  ?   Breath sounds: Normal breath sounds. No wheezing or rales.  ?Musculoskeletal:  ?   Right lower leg: No edema.  ?   Left lower leg: No edema.  ?Skin: ?   General: Skin is warm and dry.  ?   Findings: No rash.  ?Neurological:  ?   Mental Status: He is alert. Mental status is at baseline.  ?Psychiatric:     ?   Mood and Affect: Mood normal.  ? ?   ? ?Lab Results  ?Component Value Date  ? WBC 3.9 (L) 09/13/2021  ? HGB 12.0 (L) 09/13/2021  ? HCT 37.9 (L) 09/13/2021  ? PLT 210.0 09/13/2021  ? GLUCOSE 109 (H) 09/13/2021  ? CHOL 129 09/13/2021  ? TRIG 72.0 09/13/2021  ? HDL 56.40 09/13/2021  ? LDLDIRECT 135.3 06/22/2008  ?  [place] 58 09/13/2021  ? ALT 15 09/13/2021  ? AST 20 09/13/2021  ? NA 138 09/13/2021  ? K 5.0 09/13/2021  ? CL 104 09/13/2021  ? CREATININE 1.47 09/13/2021  ? BUN 13 09/13/2021  ? CO2 30 09/13/2021  ? TSH 0.75 06/24/2019  ? PSA 6.63 (H) 07/06/2020  ? INR 2.7 03/27/2022  ? HGBA1C 6.7 (H) 09/13/2021  ? MICROALBUR 27.6 (H) 12/25/2019  ? ? ? ?Assessment & Plan:  ? ? ?See Problem List for Assessment and Plan of chronic medical problems.  ? ? ?

## 2022-03-26 NOTE — Patient Instructions (Addendum)
? ? ? ?  Blood work was ordered.   ? ? ?Medications changes include :   start jardiance 10 mg daily ? ? ?Your prescription(s) have been sent to your pharmacy.  ? ? ? ?Return in about 6 months (around 09/26/2022) for follow up. ? ?

## 2022-03-27 ENCOUNTER — Ambulatory Visit (INDEPENDENT_AMBULATORY_CARE_PROVIDER_SITE_OTHER): Payer: Medicare Other | Admitting: *Deleted

## 2022-03-27 ENCOUNTER — Encounter: Payer: Self-pay | Admitting: Internal Medicine

## 2022-03-27 ENCOUNTER — Ambulatory Visit (INDEPENDENT_AMBULATORY_CARE_PROVIDER_SITE_OTHER): Payer: Medicare Other | Admitting: Internal Medicine

## 2022-03-27 VITALS — BP 140/70 | HR 60 | Temp 97.8°F | Ht 74.0 in | Wt 176.0 lb

## 2022-03-27 DIAGNOSIS — Z5181 Encounter for therapeutic drug level monitoring: Secondary | ICD-10-CM

## 2022-03-27 DIAGNOSIS — G459 Transient cerebral ischemic attack, unspecified: Secondary | ICD-10-CM

## 2022-03-27 DIAGNOSIS — I2510 Atherosclerotic heart disease of native coronary artery without angina pectoris: Secondary | ICD-10-CM

## 2022-03-27 DIAGNOSIS — E1159 Type 2 diabetes mellitus with other circulatory complications: Secondary | ICD-10-CM

## 2022-03-27 DIAGNOSIS — I5022 Chronic systolic (congestive) heart failure: Secondary | ICD-10-CM | POA: Diagnosis not present

## 2022-03-27 DIAGNOSIS — R531 Weakness: Secondary | ICD-10-CM | POA: Diagnosis not present

## 2022-03-27 DIAGNOSIS — I69998 Other sequelae following unspecified cerebrovascular disease: Secondary | ICD-10-CM

## 2022-03-27 DIAGNOSIS — I10 Essential (primary) hypertension: Secondary | ICD-10-CM | POA: Diagnosis not present

## 2022-03-27 DIAGNOSIS — E785 Hyperlipidemia, unspecified: Secondary | ICD-10-CM

## 2022-03-27 LAB — COMPREHENSIVE METABOLIC PANEL
ALT: 10 U/L (ref 0–53)
AST: 16 U/L (ref 0–37)
Albumin: 4.1 g/dL (ref 3.5–5.2)
Alkaline Phosphatase: 56 U/L (ref 39–117)
BUN: 17 mg/dL (ref 6–23)
CO2: 26 mEq/L (ref 19–32)
Calcium: 9.5 mg/dL (ref 8.4–10.5)
Chloride: 104 mEq/L (ref 96–112)
Creatinine, Ser: 1.47 mg/dL (ref 0.40–1.50)
GFR: 49.05 mL/min — ABNORMAL LOW (ref >60.00)
Glucose, Bld: 119 mg/dL — ABNORMAL HIGH (ref 70–99)
Potassium: 4.4 mEq/L (ref 3.5–5.1)
Sodium: 136 mEq/L (ref 135–145)
Total Bilirubin: 0.7 mg/dL (ref 0.2–1.2)
Total Protein: 7.4 g/dL (ref 6.0–8.3)

## 2022-03-27 LAB — LIPID PANEL
Cholesterol: 124 mg/dL (ref 0–200)
HDL: 46.2 mg/dL (ref >39.00)
LDL Cholesterol: 61 mg/dL (ref 0–99)
NonHDL: 77.3
Total CHOL/HDL Ratio: 3
Triglycerides: 84 mg/dL (ref 0.0–149.0)
VLDL: 16.8 mg/dL (ref 0.0–40.0)

## 2022-03-27 LAB — CBC WITH DIFFERENTIAL/PLATELET
Basophils Absolute: 0 10*3/uL (ref 0.0–0.1)
Basophils Relative: 0.4 % (ref 0.0–3.0)
Eosinophils Absolute: 0.1 10*3/uL (ref 0.0–0.7)
Eosinophils Relative: 2.6 % (ref 0.0–5.0)
HCT: 38.4 % — ABNORMAL LOW (ref 39.0–52.0)
Hemoglobin: 12.4 g/dL — ABNORMAL LOW (ref 13.0–17.0)
Lymphocytes Relative: 18.9 % (ref 12.0–46.0)
Lymphs Abs: 0.8 10*3/uL (ref 0.7–4.0)
MCHC: 32.3 g/dL (ref 30.0–36.0)
MCV: 78.1 fl (ref 78.0–100.0)
Monocytes Absolute: 0.5 10*3/uL (ref 0.1–1.0)
Monocytes Relative: 12.7 % — ABNORMAL HIGH (ref 3.0–12.0)
Neutro Abs: 2.8 10*3/uL (ref 1.4–7.7)
Neutrophils Relative %: 65.4 % (ref 43.0–77.0)
Platelets: 195 10*3/uL (ref 150.0–400.0)
RBC: 4.92 Mil/uL (ref 4.22–5.81)
RDW: 15.3 % (ref 11.5–15.5)
WBC: 4.3 10*3/uL (ref 4.0–10.5)

## 2022-03-27 LAB — POCT INR: INR: 2.7 (ref 2.0–3.0)

## 2022-03-27 LAB — HEMOGLOBIN A1C: Hgb A1c MFr Bld: 6.9 % — ABNORMAL HIGH (ref 4.6–6.5)

## 2022-03-27 MED ORDER — EMPAGLIFLOZIN 10 MG PO TABS: 10.0000 mg | ORAL_TABLET | Freq: Every day | ORAL | 1 refills | Status: DC

## 2022-03-27 NOTE — Assessment & Plan Note (Signed)
Chronic ?No symptoms consistent with angina ?Following with cardiology ?Continue rosuvastatin 20 mg daily, Coreg 12.5 mg daily, losartan 25 mg daily and warfarin ?

## 2022-03-27 NOTE — Assessment & Plan Note (Addendum)
Chronic ?Lab Results  ?Component Value Date  ? HGBA1C 6.7 (H) 09/13/2021  ? ?Sugars  controlled ?Check A1c, urine microalbumin today ?Continue metformin 1000 mg twice daily ?Discussed if covered -start Jardiance 10 mg daily ?Stressed regular exercise, diabetic diet ? ? ?

## 2022-03-27 NOTE — Assessment & Plan Note (Signed)
History of CVA ?Chronic left-sided weakness ?Stressed regular exercise ?Continue warfarin per Coumadin clinic ?Continue rosuvastatin 20 mg daily, losartan 25 mg daily and carvedilol 12.5 mg daily ?Stressed healthy diet, good BP and sugar control ?

## 2022-03-27 NOTE — Assessment & Plan Note (Signed)
Chronic Regular exercise and healthy diet encouraged Check lipid panel  Continue Crestor 20 mg daily 

## 2022-03-27 NOTE — Assessment & Plan Note (Signed)
Chronic ?Blood pressure well controlled ?CMP ?Continue Coreg 12.5 mg daily, losartan 25 mg daily ?

## 2022-03-27 NOTE — Assessment & Plan Note (Signed)
Chronic ?Following with cardiology ?Appears euvolemic here today ?Continue carvedilol 12.5 mg twice daily, losartan 25 mg daily ?

## 2022-03-27 NOTE — Patient Instructions (Signed)
Description   ?Continue taking Warfarin 1 tablet daily except 1.5 tablets on Mondays and Fridays. Recheck INR in 4 weeks. Call the Coumadin clinic with any questions [phone]. ?  ?  ?

## 2022-03-28 LAB — MICROALBUMIN / CREATININE URINE RATIO
Creatinine,U: 105.3 mg/dL
Microalb Creat Ratio: 6.7 mg/g (ref 0.0–30.0)
Microalb, Ur: 7 mg/dL — ABNORMAL HIGH (ref 0.0–1.9)

## 2022-04-17 NOTE — Progress Notes (Deleted)
PCP:  Binnie Rail, MD Primary Cardiologist: Cristopher Peru, MD Electrophysiologist: Cristopher Peru, MD   Karl [name] is a 66 y.o. male seen today for Cristopher Peru, MD for routine electrophysiology followup.  Since last being seen in our clinic the patient reports doing ***.  he denies chest pain, palpitations, dyspnea, PND, orthopnea, nausea, vomiting, dizziness, syncope, edema, weight gain, or early satiety.  Past Medical History:  Diagnosis Date   Acute CVA (cerebrovascular accident) ([place]) 06/14/2018   Coronary artery disease    blockage   Stent Dr. Lovena Le 15 years 1998   Dyspnea    Hypertension    Stroke [facility]) 2019   denies residual on 06/14/2018   Type II diabetes mellitus ([place]) 07/2014 dx   Past Surgical History:  Procedure Laterality Date   CORONARY ANGIOPLASTY WITH STENT PLACEMENT  1998   FRACTURE SURGERY     GREEN LIGHT LASER TURP (TRANSURETHRAL RESECTION OF PROSTATE N/A 06/05/2018   Procedure: GREEN LIGHT LASER TURP , TRANSURETHRAL RESECTION OF PROSTATE;  Surgeon: [name], MD;  Location: [facility];  Service: Urology;  Laterality: N/A;   TIBIA FRACTURE SURGERY Left 1980s    Current Outpatient Medications  Medication Sig Dispense Refill   acetaminophen (TYLENOL) 325 MG tablet Take 1-2 tablets (325-650 mg total) by mouth every 4 (four) hours as needed for mild pain.     blood glucose meter kit and supplies KIT Dispense based on patient and insurance preference. Use up to four times daily as directed. (FOR E11.9). 1 each 0   carvedilol (COREG) 12.5 MG tablet TAKE 1 TABLET BY MOUTH TWICE DAILY WITH MEALS 180 tablet 2   empagliflozin (JARDIANCE) 10 MG TABS tablet Take 1 tablet (10 mg total) by mouth daily before breakfast. 90 tablet 1   Lancets 30G MISC Use to check blood sugars daily 100 each 3   losartan (COZAAR) 25 MG tablet Take 1 tablet (25 mg total) by mouth daily. 90 tablet 2   metFORMIN (GLUCOPHAGE) 1000 MG tablet Take 1 tablet (1,000 mg total) by mouth 2 (two)  times daily with a meal. 180 tablet 2   potassium chloride (KLOR-CON) 10 MEQ tablet Take 1 tablet (10 mEq total) by mouth 2 (two) times daily. 180 tablet 2   rosuvastatin (CRESTOR) 20 MG tablet Take 1 tablet by mouth once daily 90 tablet 2   warfarin (COUMADIN) 5 MG tablet TAKE ONE TO ONE AND ONE HALF TABLETS BY MOUTH ONCE DAILY AS DIRECTED BY COUMADIN CLINIC 40 tablet 1   No current facility-administered medications for this visit.    Allergies  Allergen Reactions   Lipitor [Atorvastatin] Nausea And Vomiting    Social History   Socioeconomic History   Marital status: Married    Spouse name: Not on file   Number of children: Not on file   Years of education: Not on file   Highest education level: Not on file  Occupational History   Not on file  Tobacco Use   Smoking status: Former    Packs/day: 2.00    Years: 5.00    Pack years: 10.00    Types: Cigarettes    Quit date: 12/04/2000    Years since quitting: 21.3   Smokeless tobacco: Never  Vaping Use   Vaping Use: Never used  Substance and Sexual Activity   Alcohol use: Not Currently   Drug use: Not Currently   Sexual activity: Not on file  Other Topics Concern   Not on file  Social History Narrative   Former smoker - quit 1998 after stent -    Lives with dtr and 2 g-sons   Separated from wife in 2005, good terms   Employed with [employer] - walks at lot at work   No regular exercise   Social Determinants of Radio broadcast assistant Strain: Not on file  Food Insecurity: Not on file  Transportation Needs: Not on file  Physical Activity: Not on file  Stress: Not on file  Social Connections: Not on file  Intimate Partner Violence: Not on file     Review of Systems: All other systems reviewed and are otherwise negative except as noted above.  Physical Exam: There were no vitals filed for this visit.  GEN- The patient is well appearing, alert and oriented x 3 today.   HEENT:  normocephalic, atraumatic; sclera clear, conjunctiva pink; hearing intact; oropharynx clear; neck supple, no JVP Lymph- no cervical lymphadenopathy Lungs- Clear to ausculation bilaterally, normal work of breathing.  No wheezes, rales, rhonchi Heart- Regular rate and rhythm, no murmurs, rubs or gallops, PMI not laterally displaced GI- soft, non-tender, non-distended, bowel sounds present, no hepatosplenomegaly Extremities- no clubbing, cyanosis, or edema; DP/PT/radial pulses 2+ bilaterally MS- no significant deformity or atrophy Skin- warm and dry, no rash or lesion Psych- euthymic mood, full affect Neuro- strength and sensation are intact  EKG is ordered. Personal review of EKG from today shows ***  Additional studies reviewed include: Previous EP office notes. ***  Assessment and Plan:  1. Chronic systolic CHF Echo 01/801 LVEF 20% ICM, LBBB Has chronically refused ICD implant. Discussed again today *** Continue losartan, coreg, and jardiance.  Update echo?   2. H/o CVA Continue coumadin  3. HTN Stable on current regimen   4. Paroxysmal atrial fibrillation On coumadin as above  Follow up with {Blank single:19197::"Dr. Allred","Dr. Arlan Organ. Klein","Dr. Camnitz","Dr. Lambert","EP APP"} in {Blank single:19197::"2 weeks","4 weeks","3 months","6 months","12 months","as usual post gen change"}   Shirley Friar, [place]  04/17/22 8:48 AM

## 2022-04-21 ENCOUNTER — Other Ambulatory Visit: Payer: Self-pay | Admitting: Internal Medicine

## 2022-04-21 DIAGNOSIS — G459 Transient cerebral ischemic attack, unspecified: Secondary | ICD-10-CM

## 2022-04-21 DIAGNOSIS — Z7901 Long term (current) use of anticoagulants: Secondary | ICD-10-CM

## 2022-04-24 ENCOUNTER — Other Ambulatory Visit: Payer: Self-pay

## 2022-04-24 ENCOUNTER — Ambulatory Visit: Payer: Medicare Other

## 2022-04-24 ENCOUNTER — Ambulatory Visit: Payer: Medicare Other | Admitting: Student

## 2022-04-24 DIAGNOSIS — G459 Transient cerebral ischemic attack, unspecified: Secondary | ICD-10-CM

## 2022-04-24 DIAGNOSIS — I10 Essential (primary) hypertension: Secondary | ICD-10-CM

## 2022-04-24 DIAGNOSIS — I447 Left bundle-branch block, unspecified: Secondary | ICD-10-CM

## 2022-04-24 DIAGNOSIS — Z8673 Personal history of transient ischemic attack (TIA), and cerebral infarction without residual deficits: Secondary | ICD-10-CM

## 2022-04-24 DIAGNOSIS — Z5181 Encounter for therapeutic drug level monitoring: Secondary | ICD-10-CM

## 2022-04-24 DIAGNOSIS — Z7901 Long term (current) use of anticoagulants: Secondary | ICD-10-CM

## 2022-04-24 DIAGNOSIS — I5022 Chronic systolic (congestive) heart failure: Secondary | ICD-10-CM

## 2022-04-24 LAB — POCT INR: INR: 1.1 — AB (ref 2.0–3.0)

## 2022-04-24 MED ORDER — WARFARIN SODIUM 5 MG PO TABS: ORAL_TABLET | ORAL | 1 refills | Status: DC

## 2022-04-24 NOTE — Patient Instructions (Signed)
TAKE ANOTHER 0.5 TABLET TODAY AND 2 TABLETS Tuesday and then Continue taking Warfarin 1 tablet daily except 1.5 tablets on Mondays and Fridays. Recheck INR in 2 weeks. Call the Coumadin clinic with any questions [phone].

## 2022-04-29 ENCOUNTER — Other Ambulatory Visit: Payer: Self-pay | Admitting: Internal Medicine

## 2022-05-03 ENCOUNTER — Telehealth: Payer: Self-pay | Admitting: Internal Medicine

## 2022-05-03 NOTE — Telephone Encounter (Signed)
Left message for patient to call back to schedule Medicare Annual Wellness Visit   No hx of AWV eligible as of 09/03/20  Please schedule at anytime with [facility] Advisor if patient calls the office back.    45 Minutes appointment   Any questions, please call me at [phone]

## 2022-05-08 ENCOUNTER — Ambulatory Visit: Payer: Medicare Other

## 2022-05-08 DIAGNOSIS — Z5181 Encounter for therapeutic drug level monitoring: Secondary | ICD-10-CM | POA: Diagnosis not present

## 2022-05-08 DIAGNOSIS — G459 Transient cerebral ischemic attack, unspecified: Secondary | ICD-10-CM

## 2022-05-08 LAB — POCT INR: INR: 2.5 (ref 2.0–3.0)

## 2022-05-08 NOTE — Patient Instructions (Signed)
Description   Continue taking Warfarin 1 tablet daily except 1.5 tablets on Mondays and Fridays.  Recheck INR in 3 weeks.  Call the Coumadin clinic with any questions #[phone].

## 2022-05-29 ENCOUNTER — Ambulatory Visit: Payer: Medicare Other

## 2022-05-29 DIAGNOSIS — Z5181 Encounter for therapeutic drug level monitoring: Secondary | ICD-10-CM

## 2022-05-29 DIAGNOSIS — G459 Transient cerebral ischemic attack, unspecified: Secondary | ICD-10-CM | POA: Diagnosis not present

## 2022-05-29 LAB — POCT INR: INR: 2.8 (ref 2.0–3.0)

## 2022-06-21 ENCOUNTER — Other Ambulatory Visit: Payer: Self-pay | Admitting: Internal Medicine

## 2022-06-21 NOTE — Telephone Encounter (Signed)
1.Medication Requested: potassium chloride (KLOR-CON) 10 MEQ tablet 2. Pharmacy (Name, Street, [place]): [facility] [address] [place], [place] - [address] Phone:  [phone]  Fax:  [phone]     3. On Med List:  yes 4. Last Visit with PCP:  5. Next visit date with PCP:   Agent: Please be advised that RX refills may take up to 3 business days. We ask that you follow-up with your pharmacy.

## 2022-06-29 ENCOUNTER — Encounter (HOSPITAL_COMMUNITY): Payer: Self-pay

## 2022-06-29 ENCOUNTER — Inpatient Hospital Stay (HOSPITAL_COMMUNITY)
Admission: EM | Admit: 2022-06-29 | Discharge: 2022-07-04 | DRG: 438 | Disposition: A | Discharge status: Home / Self Care | Payer: Medicare Other | Attending: Internal Medicine | Admitting: Internal Medicine

## 2022-06-29 ENCOUNTER — Emergency Department (HOSPITAL_COMMUNITY): Payer: Medicare Other

## 2022-06-29 ENCOUNTER — Observation Stay (HOSPITAL_COMMUNITY): Payer: Medicare Other

## 2022-06-29 ENCOUNTER — Other Ambulatory Visit: Payer: Self-pay

## 2022-06-29 DIAGNOSIS — B9620 Unspecified Escherichia coli [E. coli] as the cause of diseases classified elsewhere: Secondary | ICD-10-CM | POA: Diagnosis present

## 2022-06-29 DIAGNOSIS — G459 Transient cerebral ischemic attack, unspecified: Secondary | ICD-10-CM | POA: Diagnosis not present

## 2022-06-29 DIAGNOSIS — R778 Other specified abnormalities of plasma proteins: Secondary | ICD-10-CM

## 2022-06-29 DIAGNOSIS — K8500 Idiopathic acute pancreatitis without necrosis or infection: Principal | ICD-10-CM | POA: Diagnosis present

## 2022-06-29 DIAGNOSIS — E785 Hyperlipidemia, unspecified: Secondary | ICD-10-CM | POA: Diagnosis not present

## 2022-06-29 DIAGNOSIS — E1122 Type 2 diabetes mellitus with diabetic chronic kidney disease: Secondary | ICD-10-CM | POA: Diagnosis present

## 2022-06-29 DIAGNOSIS — Z79899 Other long term (current) drug therapy: Secondary | ICD-10-CM

## 2022-06-29 DIAGNOSIS — N400 Enlarged prostate without lower urinary tract symptoms: Secondary | ICD-10-CM | POA: Diagnosis not present

## 2022-06-29 DIAGNOSIS — I69354 Hemiplegia and hemiparesis following cerebral infarction affecting left non-dominant side: Secondary | ICD-10-CM

## 2022-06-29 DIAGNOSIS — R112 Nausea with vomiting, unspecified: Secondary | ICD-10-CM | POA: Diagnosis not present

## 2022-06-29 DIAGNOSIS — K7689 Other specified diseases of liver: Secondary | ICD-10-CM | POA: Diagnosis present

## 2022-06-29 DIAGNOSIS — Z888 Allergy status to other drugs, medicaments and biological substances status: Secondary | ICD-10-CM

## 2022-06-29 DIAGNOSIS — E1159 Type 2 diabetes mellitus with other circulatory complications: Secondary | ICD-10-CM | POA: Diagnosis present

## 2022-06-29 DIAGNOSIS — I69998 Other sequelae following unspecified cerebrovascular disease: Secondary | ICD-10-CM | POA: Diagnosis not present

## 2022-06-29 DIAGNOSIS — N1831 Chronic kidney disease, stage 3a: Secondary | ICD-10-CM | POA: Diagnosis present

## 2022-06-29 DIAGNOSIS — N390 Urinary tract infection, site not specified: Secondary | ICD-10-CM | POA: Diagnosis present

## 2022-06-29 DIAGNOSIS — E1169 Type 2 diabetes mellitus with other specified complication: Secondary | ICD-10-CM | POA: Diagnosis present

## 2022-06-29 DIAGNOSIS — I2510 Atherosclerotic heart disease of native coronary artery without angina pectoris: Secondary | ICD-10-CM | POA: Diagnosis present

## 2022-06-29 DIAGNOSIS — I248 Other forms of acute ischemic heart disease: Secondary | ICD-10-CM | POA: Diagnosis not present

## 2022-06-29 DIAGNOSIS — I10 Essential (primary) hypertension: Secondary | ICD-10-CM | POA: Diagnosis not present

## 2022-06-29 DIAGNOSIS — Z87891 Personal history of nicotine dependence: Secondary | ICD-10-CM

## 2022-06-29 DIAGNOSIS — K831 Obstruction of bile duct: Secondary | ICD-10-CM | POA: Diagnosis not present

## 2022-06-29 DIAGNOSIS — I69393 Ataxia following cerebral infarction: Secondary | ICD-10-CM

## 2022-06-29 DIAGNOSIS — Z8719 Personal history of other diseases of the digestive system: Secondary | ICD-10-CM

## 2022-06-29 DIAGNOSIS — I480 Paroxysmal atrial fibrillation: Secondary | ICD-10-CM | POA: Diagnosis present

## 2022-06-29 DIAGNOSIS — G8114 Spastic hemiplegia affecting left nondominant side: Secondary | ICD-10-CM | POA: Diagnosis present

## 2022-06-29 DIAGNOSIS — I130 Hypertensive heart and chronic kidney disease with heart failure and stage 1 through stage 4 chronic kidney disease, or unspecified chronic kidney disease: Secondary | ICD-10-CM | POA: Diagnosis not present

## 2022-06-29 DIAGNOSIS — I5042 Chronic combined systolic (congestive) and diastolic (congestive) heart failure: Secondary | ICD-10-CM | POA: Diagnosis present

## 2022-06-29 DIAGNOSIS — R531 Weakness: Secondary | ICD-10-CM

## 2022-06-29 DIAGNOSIS — Z9079 Acquired absence of other genital organ(s): Secondary | ICD-10-CM

## 2022-06-29 DIAGNOSIS — K828 Other specified diseases of gallbladder: Secondary | ICD-10-CM | POA: Diagnosis not present

## 2022-06-29 DIAGNOSIS — D509 Iron deficiency anemia, unspecified: Secondary | ICD-10-CM | POA: Diagnosis present

## 2022-06-29 DIAGNOSIS — K859 Acute pancreatitis without necrosis or infection, unspecified: Secondary | ICD-10-CM | POA: Diagnosis not present

## 2022-06-29 DIAGNOSIS — R3989 Other symptoms and signs involving the genitourinary system: Secondary | ICD-10-CM

## 2022-06-29 DIAGNOSIS — R9431 Abnormal electrocardiogram [ECG] [EKG]: Secondary | ICD-10-CM

## 2022-06-29 DIAGNOSIS — Z7984 Long term (current) use of oral hypoglycemic drugs: Secondary | ICD-10-CM

## 2022-06-29 DIAGNOSIS — I429 Cardiomyopathy, unspecified: Secondary | ICD-10-CM | POA: Diagnosis present

## 2022-06-29 DIAGNOSIS — G8110 Spastic hemiplegia affecting unspecified side: Secondary | ICD-10-CM | POA: Diagnosis present

## 2022-06-29 DIAGNOSIS — I5022 Chronic systolic (congestive) heart failure: Secondary | ICD-10-CM | POA: Diagnosis present

## 2022-06-29 DIAGNOSIS — E559 Vitamin D deficiency, unspecified: Secondary | ICD-10-CM | POA: Diagnosis present

## 2022-06-29 DIAGNOSIS — I472 Ventricular tachycardia, unspecified: Secondary | ICD-10-CM | POA: Diagnosis not present

## 2022-06-29 DIAGNOSIS — Z8673 Personal history of transient ischemic attack (TIA), and cerebral infarction without residual deficits: Secondary | ICD-10-CM | POA: Diagnosis not present

## 2022-06-29 DIAGNOSIS — K835 Biliary cyst: Secondary | ICD-10-CM | POA: Diagnosis present

## 2022-06-29 DIAGNOSIS — R791 Abnormal coagulation profile: Secondary | ICD-10-CM | POA: Diagnosis present

## 2022-06-29 DIAGNOSIS — E119 Type 2 diabetes mellitus without complications: Secondary | ICD-10-CM

## 2022-06-29 DIAGNOSIS — Z823 Family history of stroke: Secondary | ICD-10-CM

## 2022-06-29 DIAGNOSIS — Z7901 Long term (current) use of anticoagulants: Secondary | ICD-10-CM

## 2022-06-29 DIAGNOSIS — Z833 Family history of diabetes mellitus: Secondary | ICD-10-CM

## 2022-06-29 DIAGNOSIS — E876 Hypokalemia: Secondary | ICD-10-CM | POA: Diagnosis not present

## 2022-06-29 DIAGNOSIS — Z955 Presence of coronary angioplasty implant and graft: Secondary | ICD-10-CM

## 2022-06-29 LAB — URINALYSIS, ROUTINE W REFLEX MICROSCOPIC
Bilirubin Urine: NEGATIVE
Glucose, UA: >=500 mg/dL — AB
Ketones, ur: 20 mg/dL — AB
Nitrite: NEGATIVE
Protein, ur: 30 mg/dL — AB
Specific Gravity, Urine: 1.029 (ref 1.005–1.030)
WBC, UA: >50 WBC/hpf — ABNORMAL HIGH (ref 0–5)
pH: 6 (ref 5.0–8.0)

## 2022-06-29 LAB — HEPATIC FUNCTION PANEL
ALT: 81 U/L — ABNORMAL HIGH (ref 0–44)
AST: 84 U/L — ABNORMAL HIGH (ref 15–41)
Albumin: 3.6 g/dL (ref 3.5–5.0)
Alkaline Phosphatase: 103 U/L (ref 38–126)
Bilirubin, Direct: 0.8 mg/dL — ABNORMAL HIGH (ref 0.0–0.2)
Indirect Bilirubin: 0.5 mg/dL (ref 0.3–0.9)
Total Bilirubin: 1.3 mg/dL — ABNORMAL HIGH (ref 0.3–1.2)
Total Protein: 8 g/dL (ref 6.5–8.1)

## 2022-06-29 LAB — BASIC METABOLIC PANEL
Anion gap: 11 (ref 5–15)
BUN: 25 mg/dL — ABNORMAL HIGH (ref 8–23)
CO2: 20 mmol/L — ABNORMAL LOW (ref 22–32)
Calcium: 9.7 mg/dL (ref 8.9–10.3)
Chloride: 109 mmol/L (ref 98–111)
Creatinine, Ser: 1.72 mg/dL — ABNORMAL HIGH (ref 0.61–1.24)
GFR, Estimated: 43 mL/min — ABNORMAL LOW (ref >60)
Glucose, Bld: 173 mg/dL — ABNORMAL HIGH (ref 70–99)
Potassium: 4.4 mmol/L (ref 3.5–5.1)
Sodium: 140 mmol/L (ref 135–145)

## 2022-06-29 LAB — CBC
HCT: 42.7 % (ref 39.0–52.0)
Hemoglobin: 13.2 g/dL (ref 13.0–17.0)
MCH: 24.8 pg — ABNORMAL LOW (ref 26.0–34.0)
MCHC: 30.9 g/dL (ref 30.0–36.0)
MCV: 80.1 fL (ref 80.0–100.0)
Platelets: 229 10*3/uL (ref 150–400)
RBC: 5.33 MIL/uL (ref 4.22–5.81)
RDW: 15.9 % — ABNORMAL HIGH (ref 11.5–15.5)
WBC: 9 10*3/uL (ref 4.0–10.5)
nRBC: 0 % (ref 0.0–0.2)

## 2022-06-29 LAB — TROPONIN I (HIGH SENSITIVITY)
Troponin I (High Sensitivity): 41 ng/L — ABNORMAL HIGH (ref <18)
Troponin I (High Sensitivity): 50 ng/L — ABNORMAL HIGH (ref <18)

## 2022-06-29 LAB — PROTIME-INR
INR: 2.1 — ABNORMAL HIGH (ref 0.8–1.2)
Prothrombin Time: 23.1 seconds — ABNORMAL HIGH (ref 11.4–15.2)

## 2022-06-29 LAB — CBG MONITORING, ED
Glucose-Capillary: 156 mg/dL — ABNORMAL HIGH (ref 70–99)
Glucose-Capillary: 150 mg/dL — ABNORMAL HIGH (ref 70–99)

## 2022-06-29 LAB — LIPASE, BLOOD: Lipase: 3579 U/L — ABNORMAL HIGH (ref 11–51)

## 2022-06-29 MED ORDER — ACETAMINOPHEN 325 MG PO TABS: 650.0000 mg | ORAL_TABLET | Freq: Four times a day (QID) | ORAL | Status: DC | PRN

## 2022-06-29 MED ORDER — LOSARTAN POTASSIUM 25 MG PO TABS
25.0000 mg | ORAL_TABLET | Freq: Every day | ORAL | Status: DC
  Administered 2022-06-30: 25 mg via ORAL
  Filled 2022-06-29: qty 1

## 2022-06-29 MED ORDER — ACETAMINOPHEN 650 MG RE SUPP: 650.0000 mg | Freq: Four times a day (QID) | RECTAL | Status: DC | PRN

## 2022-06-29 MED ORDER — SODIUM CHLORIDE 0.9 % IV BOLUS
1000.0000 mL | Freq: Once | INTRAVENOUS | Status: AC
Start: 2022-06-29 — End: 2022-06-29
  Administered 2022-06-29: 1000 mL via INTRAVENOUS

## 2022-06-29 MED ORDER — INSULIN ASPART 100 UNIT/ML IJ SOLN
0.0000 [IU] | Freq: Three times a day (TID) | INTRAMUSCULAR | Status: DC
  Administered 2022-06-30 – 2022-07-02 (×5): 1 [IU] via SUBCUTANEOUS
  Administered 2022-07-03: 2 [IU] via SUBCUTANEOUS
  Filled 2022-06-29: qty 0.09

## 2022-06-29 MED ORDER — SODIUM CHLORIDE 0.9 % IV SOLN
1.0000 g | Freq: Once | INTRAVENOUS | Status: AC
  Administered 2022-06-29: 1 g via INTRAVENOUS
  Filled 2022-06-29: qty 10

## 2022-06-29 MED ORDER — SODIUM CHLORIDE 0.9 % IV SOLN
1.0000 g | INTRAVENOUS | Status: DC
  Administered 2022-06-30: 1 g via INTRAVENOUS
  Filled 2022-06-29: qty 10

## 2022-06-29 MED ORDER — CARVEDILOL 12.5 MG PO TABS
12.5000 mg | ORAL_TABLET | Freq: Two times a day (BID) | ORAL | Status: DC
  Administered 2022-06-29 – 2022-06-30 (×2): 12.5 mg via ORAL
  Filled 2022-06-29 (×2): qty 1

## 2022-06-29 MED ORDER — GADOBUTROL 1 MMOL/ML IV SOLN
7.0000 mL | Freq: Once | INTRAVENOUS | Status: AC | PRN
  Administered 2022-06-30: 7 mL via INTRAVENOUS

## 2022-06-29 MED ORDER — SODIUM CHLORIDE 0.9 % IV SOLN: INTRAVENOUS | Status: AC

## 2022-06-29 MED ORDER — SODIUM CHLORIDE 0.9% FLUSH
3.0000 mL | Freq: Two times a day (BID) | INTRAVENOUS | Status: DC
  Administered 2022-06-30 – 2022-07-04 (×8): 3 mL via INTRAVENOUS

## 2022-06-29 MED ORDER — POLYETHYLENE GLYCOL 3350 17 G PO PACK: 17.0000 g | PACK | Freq: Every day | ORAL | Status: DC | PRN

## 2022-06-29 MED ORDER — HYDROMORPHONE HCL 1 MG/ML IJ SOLN: 0.5000 mg | INTRAMUSCULAR | Status: DC | PRN

## 2022-06-29 MED ORDER — ONDANSETRON HCL 4 MG/2ML IJ SOLN
4.0000 mg | Freq: Once | INTRAMUSCULAR | Status: AC
  Administered 2022-06-29: 4 mg via INTRAVENOUS
  Filled 2022-06-29: qty 2

## 2022-06-29 MED ORDER — ROSUVASTATIN CALCIUM 20 MG PO TABS
20.0000 mg | ORAL_TABLET | Freq: Every day | ORAL | Status: DC
  Administered 2022-06-30: 20 mg via ORAL
  Filled 2022-06-29: qty 1

## 2022-06-29 MED ORDER — FENTANYL CITRATE PF 50 MCG/ML IJ SOSY
50.0000 ug | PREFILLED_SYRINGE | Freq: Once | INTRAMUSCULAR | Status: AC
  Administered 2022-06-29: 50 ug via INTRAVENOUS
  Filled 2022-06-29: qty 1

## 2022-06-29 MED ORDER — IOHEXOL 300 MG/ML  SOLN
80.0000 mL | Freq: Once | INTRAMUSCULAR | Status: AC | PRN
  Administered 2022-06-29: 80 mL via INTRAVENOUS

## 2022-06-29 NOTE — H&P (Addendum)
History and Physical   Karl [name] KYH:[id number] DOB: [birth year] DOA: 06/29/2022  PCP: Binnie Rail, MD   Patient coming from: Home  Chief Complaint: Abdominal pain, nausea, vomiting, weakness  HPI: Karl [name] is a 66 y.o. male with medical history significant of hypertension, CHF, CAD, diabetes, hyperlipidemia, BPH, CVA with residual left-sided deficits, anemia presenting with abdominal pain, weakness, nausea and vomiting.  Patient reports 2 days of symptoms with 8 out of 10 abdominal pain described as crampy sensation, currently pain has been improved in the ED.  Also with innumerable episodes of nausea and vomiting without blood or bilious material.  With the above symptoms he has developed weakness as well.  Denies any sick contacts nor unusual foods.  Denies trying anything for the symptoms.  Does report some blood in his urine. He denies fevers, chills, chest pain, shortness of breath.  ED Course: Vital signs in the ED significant for heart rate in the 80s to 120s, respiratory rate in the teens to 20s, blood pressure in the 151V to 616W systolic.  Lab work-up included BMP with bicarb 20, BUN 25, creatinine of 1.72 near baseline of 1.5, glucose 173.  LFTs with AST elevated to 89 and ALT elevated 81 and T. bili of 1.3.  CBC within normal limits.  PT and INR stably elevated at 23.1 and 2.1 respectively.  Lipase elevated to 3579.  Urinalysis with hemoglobin, glucose, ketones, protein, leukocytes, bacteria.  Urine cultures pending.  CT of the abdomen pelvis showed prominent intrahepatic bile duct dilation with less prominent extrahepatic ductal dilation.  Gallbladder distention without evidence of stones or wall thickening, hepatic cystic changes, different diagnosis including obstruction versus Coley toco seal versus cystadenoma recommending MRCP and follow-up.  No evidence of pancreatic mass nor pancreatic ductal dilation.  Prostatomegaly noted.  Hiatal hernia noted.  Patient received  fentanyl, ceftriaxone, Zofran, liter fluids in the ED.  GI consulted and recommended proceeding with MRCP and putting patient on clear liquid diet.  GI will see tomorrow.  Review of Systems: As per HPI otherwise all other systems reviewed and are negative.  Past Medical History:  Diagnosis Date   Acute CVA (cerebrovascular accident) ([place]) 06/14/2018   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 Lovena Le 15 years 1998   Dyspnea    Hypertension    Stroke [facility]) 2019   denies residual on 06/14/2018   TIA (transient ischemic attack) 12/19/2017   Type II diabetes mellitus ([place]) 07/2014 dx    Past Surgical History:  Procedure Laterality Date   CORONARY ANGIOPLASTY WITH STENT PLACEMENT  1998   FRACTURE SURGERY     GREEN LIGHT LASER TURP (TRANSURETHRAL RESECTION OF PROSTATE N/A 06/05/2018   Procedure: GREEN LIGHT LASER TURP , TRANSURETHRAL RESECTION OF PROSTATE;  Surgeon: [name], MD;  Location: [facility];  Service: Urology;  Laterality: N/A;   TIBIA FRACTURE SURGERY Left 1980s    Social History  reports that he quit smoking about 21 years ago. His smoking use included cigarettes. He has a 10.00 pack-year smoking history. He has never used smokeless tobacco. He reports that he does not currently use alcohol. He reports that he does not currently use drugs.  Allergies  Allergen Reactions   Lipitor [Atorvastatin] Nausea And Vomiting    Family History  Problem Relation Age of Onset   Diabetes Mother    Diabetes Father    Stroke Father 21  Diabetes Sister    Clotting disorder Sister 59   Diabetes Other        nephew   Diabetes Brother    Diabetes Maternal Grandmother    Colon cancer Neg Hx    Esophageal cancer Neg Hx    Pancreatic cancer Neg Hx    Stomach cancer Neg Hx    Liver disease Neg Hx   Reviewed on admission  Prior to Admission medications   Medication Sig Start Date End Date Taking? Authorizing Provider  acetaminophen  (TYLENOL) 325 MG tablet Take 1-2 tablets (325-650 mg total) by mouth every 4 (four) hours as needed for mild pain. 07/09/18   Love, Ivan Anchors, PA-C  blood glucose meter kit and supplies KIT Dispense based on patient and insurance preference. Use up to four times daily as directed. (FOR E11.9). 07/15/18   Binnie Rail, MD  carvedilol (COREG) 12.5 MG tablet TAKE 1 TABLET BY MOUTH TWICE DAILY WITH MEALS 09/13/21   Burns, Claudina Lick, MD  empagliflozin (JARDIANCE) 10 MG TABS tablet Take 1 tablet (10 mg total) by mouth daily before breakfast. 03/27/22   Binnie Rail, MD  Lancets 30G MISC Use to check blood sugars daily 07/15/18   Binnie Rail, MD  losartan (COZAAR) 25 MG tablet Take 1 tablet (25 mg total) by mouth daily. 09/13/21   Binnie Rail, MD  metFORMIN (GLUCOPHAGE) 1000 MG tablet Take 1 tablet (1,000 mg total) by mouth 2 (two) times daily with a meal. 09/13/21   Burns, Claudina Lick, MD  potassium chloride (KLOR-CON M) 10 MEQ tablet Take 1 tablet by mouth twice daily 06/21/22   Binnie Rail, MD  rosuvastatin (CRESTOR) 20 MG tablet Take 1 tablet by mouth once daily 05/02/22   Evans Lance, MD  warfarin (COUMADIN) 5 MG tablet TAKE 1 TO 1 & 1/2 (ONE & ONE-HALF) TABLETS BY MOUTH ONCE DAILY AS DIRECTED BY COUMADIN CLINIC 04/24/22   Evans Lance, MD    Physical Exam: Vitals:   06/29/22 1930 06/29/22 1945 06/29/22 2000 06/29/22 2030  BP: 135/86 122/68 138/75 134/70  Pulse: (!) 126 (!) 104 (!) 105 (!) 104  Resp: 16 (!) 30 (!) 28 (!) 24  Temp:      TempSrc:      SpO2: 96% 97% 98% 99%  Weight:      Height:        Physical Exam Constitutional:      General: He is not in acute distress.    Appearance: Normal appearance.  HENT:     Head: Normocephalic and atraumatic.     Mouth/Throat:     Mouth: Mucous membranes are moist.     Pharynx: Oropharynx is clear.  Eyes:     Extraocular Movements: Extraocular movements intact.     Pupils: Pupils are equal, round, and reactive to light.   Cardiovascular:     Rate and Rhythm: Normal rate and regular rhythm.     Pulses: Normal pulses.     Heart sounds: Normal heart sounds.  Pulmonary:     Effort: Pulmonary effort is normal. No respiratory distress.     Breath sounds: Normal breath sounds.  Abdominal:     General: Bowel sounds are normal. There is no distension.     Palpations: Abdomen is soft.  Musculoskeletal:        General: No swelling or deformity.  Skin:    General: Skin is warm and dry.  Neurological:     Mental Status: Mental  status is at baseline.     Comments: Chronic left sided deficits    Labs on Admission: I have personally reviewed following labs and imaging studies  CBC: Recent Labs  Lab 06/29/22 1520  WBC 9.0  HGB 13.2  HCT 42.7  MCV 80.1  PLT 742    Basic Metabolic Panel: Recent Labs  Lab 06/29/22 1520  NA 140  K 4.4  CL 109  CO2 20*  GLUCOSE 173*  BUN 25*  CREATININE 1.72*  CALCIUM 9.7    GFR: Estimated Creatinine Clearance: 40.1 mL/min (A) (by C-G formula based on SCr of 1.72 mg/dL (H)).  Liver Function Tests: Recent Labs  Lab 06/29/22 1520  AST 84*  ALT 81*  ALKPHOS 103  BILITOT 1.3*  PROT 8.0  ALBUMIN 3.6    Urine analysis:    Component Value Date/Time   COLORURINE YELLOW 06/29/2022 1821   APPEARANCEUR CLOUDY (A) 06/29/2022 1821   LABSPEC 1.029 06/29/2022 1821   PHURINE 6.0 06/29/2022 1821   GLUCOSEU >=500 (A) 06/29/2022 1821   HGBUR MODERATE (A) 06/29/2022 1821   BILIRUBINUR NEGATIVE 06/29/2022 1821   BILIRUBINUR moderate 07/09/2014 1428   KETONESUR 20 (A) 06/29/2022 1821   PROTEINUR 30 (A) 06/29/2022 1821   UROBILINOGEN 0.2 08/07/2015 0854   NITRITE NEGATIVE 06/29/2022 1821   LEUKOCYTESUR LARGE (A) 06/29/2022 1821    Radiological Exams on Admission: CT Abdomen Pelvis W Contrast  Result Date: 06/29/2022 CLINICAL DATA:  Nausea, vomiting, right lower quadrant abdominal pain, mid abdominal pain, and weakness for 2 days. EXAM: CT ABDOMEN AND PELVIS WITH  CONTRAST TECHNIQUE: Multidetector CT imaging of the abdomen and pelvis was performed using the standard protocol following bolus administration of intravenous contrast. RADIATION DOSE REDUCTION: This exam was performed according to the departmental dose-optimization program which includes automated exposure control, adjustment of the mA and/or kV according to patient size and/or use of iterative reconstruction technique. CONTRAST:  61m OMNIPAQUE IOHEXOL 300 MG/ML  SOLN COMPARISON:  Abdominal radiographs 06/15/2018 FINDINGS: Lower chest: Atelectasis in the lung bases. Moderate-sized esophageal hiatal hernia. Hepatobiliary: The gallbladder is distended. No discrete stones or wall thickening identified. Mild extra and moderate to severe intrahepatic bile duct dilatation. Cystic dilatation of the central bile ducts in the right and left side, largest on the left measuring 4.2 cm diameter. No common duct stones are visualized. Findings could represent biliary obstruction, choledochocele, or possibly cystadenoma. Suggest follow-up with elective MRI for further evaluation. Pancreas: No pancreatic mass or infiltration. No pancreatic ductal dilatation. Spleen: Normal in size without focal abnormality. Adrenals/Urinary Tract: Adrenal glands are unremarkable. Kidneys are normal, without renal calculi, focal lesion, or hydronephrosis. Bladder is unremarkable. Stomach/Bowel: Stomach, small bowel, and colon are not abnormally distended. No wall thickening or inflammatory changes are appreciated. Appendix is normal. Vascular/Lymphatic: Aortic atherosclerosis. No enlarged abdominal or pelvic lymph nodes. Reproductive: Marked enlargement of the prostate gland, measuring 9.2 cm diameter. Other: No free air or free fluid in the abdomen. Minimal periumbilical hernia containing fat. Musculoskeletal: Degenerative changes.  No acute bony abnormalities. IMPRESSION: 1. Prominent intrahepatic bile duct dilatation with less pronounced  extrahepatic bile duct dilatation, gallbladder distention, and central hepatic cystic changes. Differential diagnosis includes biliary obstruction, possibly cholangiocarcinoma or strictures, choledochocele, or possibly cystadenoma. Suggest follow-up with nonemergent MRI/MRCP for further evaluation. 2. No pancreatic mass or ductal dilatation identified. 3. Marked enlargement of the prostate gland measuring 9.2 cm in diameter. 4. Aortic atherosclerosis. 5. Moderate-sized esophageal hiatal hernia. Electronically Signed   By: WOren BeckmannD.  On: 06/29/2022 17:22    EKG: Independently reviewed.  Sinus rhythm at 82 bpm.  Baseline wander.  T wave inversion in inferior lateral leads.  Nonspecific intraventricular conduction delay.  Likely left bundle branch block, previous EKG with evidence of similar morphology and appearance of left bundle branch block T wave inversions are more pronounced.  Assessment/Plan Principal Problem:   Biliary obstruction Active Problems:   Essential hypertension   Chronic systolic heart failure (HCC)   Diabetes (HCC)   Dyslipidemia   BPH without urinary obstruction   Coronary artery disease   History of CVA (cerebrovascular accident)   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   Spastic hemiparesis (HCC)   Pancreatitis   Suspected UTI   Biliary obstruction > Presenting with abdominal pain on imaging noted to have evidence of intrahepatic bile ductal dilation with less prominent extrahepatic ductal dilation and gallbladder distention.  No evidence of stones.  Differential on read included obstruction versus cystadenoma versus choledochocele.  MRCP recommended. > GI consulted and recommended proceeding with MRCP and placing patient on clear liquid diet.  They will see patient tomorrow. - Appreciate GI recommendations - Monitor on telemetry - Proceed with MRCP - Clear liquid diet - Supportive care  Pancreatitis > Patient with abdominal pain and lipase elevated  to 3579.  No evidence of significant pancreatitis on CT and no pancreatic mass nor ductal dilation. > Though there was no obstruction noted on CT there is a possibility of stone that has already passed.  We will treat for possible pancreatitis as well with bowel rest and fluids and pain control. - Monitor on telemetry as above - Continue gentle IV fluids given history of CHF - Pain control as needed - Clear liquid diet as above   CAD EKG abnormality > Patient with new T wave changes possibly representing a degree of demand ischemia given his history of diffuse hypokinesis and CAD. - We will check troponin for completeness - Continue home carvedilol, losartan, rosuvastatin - Also on warfarin  ?UTI > Urinalysis consistent with UTI in the ED however no current dysuria or polyuria. > Could represent asymptomatic bacteriuria, was started on ceftriaxone in the ED. - We will continue ceftriaxone for now - Follow-up urine cultures  History of CVA > Patient with history of CVA currently on warfarin.  This appears to be due to history of paroxysmal small A-fib noted on cardiology note in chart as well as history of swirling loosely organized thrombotic material on echo in 2019. > Has residual left-sided deficits. - Continue home warfarin - Continue home rosuvastatin  CHF > Last echo in 2019 with EF 20%, diffuse hypokinesis, swirling contrast with loosely organized thrombus material. > Not currently on diuretic.  No echo for 3 to 4 years. > Considering lack of recent echocardiogram and will needs for IV hydration while here.  Will get repeat echo to evaluate current cardiac status. - Gentle IV fluids as above - Echocardiogram - I's and O's - Daily weights - Continue home carvedilol, losartan  Hypertension - Continue home carvedilol, losartan  Diabetes - SSI  Hyperlipidemia - Continue home rosuvastatin  DVT prophylaxis: Warfarin Code Status:   Full Family Communication:  None on  admission.  States his family was with him earlier but of got home, they are up-to-date per patient.  Disposition Plan:   Patient is from:  Home  Anticipated DC to:  Home  Anticipated DC date:  1 to 3 days  Anticipated DC barriers: None  Consults called:  GI, consulted in the ED, will see patient in the morning Admission status:  Observation, telemetry  Severity of Illness: The appropriate patient status for this patient is OBSERVATION. Observation status is judged to be reasonable and necessary in order to provide the required intensity of service to ensure the patient's safety. The patient's presenting symptoms, physical exam findings, and initial radiographic and laboratory data in the context of their medical condition is felt to place them at decreased risk for further clinical deterioration. Furthermore, it is anticipated that the patient will be medically stable for discharge from the hospital within 2 midnights of admission.    Marcelyn Bruins MD [facility]  How to contact the [facility] Attending or Consulting provider [place] or covering provider during after hours [place], for this patient?   Check the care team in [facility] and look for a) attending/consulting [facility] provider listed and b) the [facility] team listed Log into [email or web address] and use [place] universal password to access. If you do not have the password, please contact the hospital operator. Locate the [facility] provider you are looking for under [facility] and page to a number that you can be directly reached. If you still have difficulty reaching the provider, please page the [facility] (Director on Call) for the Hospitalists listed on amion for assistance.  06/29/2022, 9:20 PM

## 2022-06-29 NOTE — ED Notes (Signed)
Patient to MRI.

## 2022-06-29 NOTE — Progress Notes (Signed)
ANTICOAGULATION CONSULT NOTE  Pharmacy Consult for warfarin Indication: hx stroke  Allergies  Allergen Reactions   Lipitor [Atorvastatin] Nausea And Vomiting    Patient Measurements: Height: 6\' 2"  (188 cm) Weight: 68 kg (150 lb) IBW/kg (Calculated) : 82.2 Heparin Dosing Weight:   Vital Signs: Temp: 98.3 F (36.8 C) (07/27 1828) Temp Source: Oral (07/27 1828) BP: 134/70 (07/27 2030) Pulse Rate: 104 (07/27 2030)  Labs: Recent Labs    06/29/22 1520  HGB 13.2  HCT 42.7  PLT 229  LABPROT 23.1*  INR 2.1*  CREATININE 1.72*    Estimated Creatinine Clearance: 40.1 mL/min (A) (by C-G formula based on SCr of 1.72 mg/dL (H)).   Medications:  - PTA warfarin regimen: 5mg  daily except 7.5 mg on Mon and Fri (last dose taken at 8a on 06/29/22 PTA)  Assessment: Patient is a 66 y.o M with hx CVA on warfarin  PTA who presented to the ED on 06/29/22 with c/o abdominal pain, n/v and generalized weakness.  Pharmacy has been consulted to resume warfarin.  Today, 06/29/2022: - INR is therapeutic at 2.1 - cbc ok   Goal of Therapy:  INR 2-3 Monitor platelets by anticoagulation protocol: Yes   Plan:  - Patient has already taken dose for today. - f/u INR on 06/29/22 and order warfarin dose   Vannie Hochstetler P 06/29/2022,8:50 PM

## 2022-06-29 NOTE — ED Triage Notes (Addendum)
Patient c/o mid abdominal pain, weakness, and vomiting x 2 days.  Patient denies any diarrhea or fever.

## 2022-06-29 NOTE — ED Provider Notes (Signed)
[facility] DEPT Provider Note   CSN: [id number] Arrival date & time: 06/29/22  1447     History  Chief Complaint  Patient presents with   Emesis   Abdominal Pain   Weakness    Karl [name] is a 66 y.o. male.  He has a prior history of cardiomyopathy coronary disease stroke leaving him with some left-sided deficits.  Complaining of nausea vomiting and lower abdominal pain 8/10 that started yesterday.  He said he has vomited too many times to count.  No blood in the vomitus no diarrhea constipation no urinary symptoms.  No fevers or chills cough shortness of breath.  No recent travel or sick contacts.  He has tried nothing for his symptoms.  No prior abdominal surgery.  The history is provided by the patient.  Emesis Severity:  Severe Duration:  2 days Timing:  Intermittent Progression:  Unchanged Chronicity:  New Recent urination:  Normal Relieved by:  Nothing Worsened by:  Nothing Associated symptoms: abdominal pain   Associated symptoms: no fever and no sore throat   Risk factors: no sick contacts and no suspect food intake   Abdominal Pain Pain location:  RUQ, RLQ and suprapubic Pain severity:  Severe Onset quality:  Gradual Duration:  2 days Timing:  Constant Progression:  Unchanged Chronicity:  New Relieved by:  Nothing Worsened by:  Nothing Ineffective treatments:  None tried Associated symptoms: vomiting   Associated symptoms: no chest pain, no constipation, no dysuria, no fever, no hematemesis, no hematuria, no shortness of breath and no sore throat        Home Medications Prior to Admission medications   Medication Sig Start Date End Date Taking? Authorizing Provider  acetaminophen (TYLENOL) 325 MG tablet Take 1-2 tablets (325-650 mg total) by mouth every 4 (four) hours as needed for mild pain. 07/09/18   Love, Ivan Anchors, PA-C  blood glucose meter kit and supplies KIT Dispense based on patient and insurance preference. Use up  to four times daily as directed. (FOR E11.9). 07/15/18   Binnie Rail, MD  carvedilol (COREG) 12.5 MG tablet TAKE 1 TABLET BY MOUTH TWICE DAILY WITH MEALS 09/13/21   Burns, Claudina Lick, MD  empagliflozin (JARDIANCE) 10 MG TABS tablet Take 1 tablet (10 mg total) by mouth daily before breakfast. 03/27/22   Binnie Rail, MD  Lancets 30G MISC Use to check blood sugars daily 07/15/18   Binnie Rail, MD  losartan (COZAAR) 25 MG tablet Take 1 tablet (25 mg total) by mouth daily. 09/13/21   Binnie Rail, MD  metFORMIN (GLUCOPHAGE) 1000 MG tablet Take 1 tablet (1,000 mg total) by mouth 2 (two) times daily with a meal. 09/13/21   Burns, Claudina Lick, MD  potassium chloride (KLOR-CON M) 10 MEQ tablet Take 1 tablet by mouth twice daily 06/21/22   Binnie Rail, MD  rosuvastatin (CRESTOR) 20 MG tablet Take 1 tablet by mouth once daily 05/02/22   Evans Lance, MD  warfarin (COUMADIN) 5 MG tablet TAKE 1 TO 1 & 1/2 (ONE & ONE-HALF) TABLETS BY MOUTH ONCE DAILY AS DIRECTED BY COUMADIN CLINIC 04/24/22   Evans Lance, MD      Allergies    Lipitor [atorvastatin]    Review of Systems   Review of Systems  Constitutional:  Negative for fever.  HENT:  Negative for sore throat.   Respiratory:  Negative for shortness of breath.   Cardiovascular:  Negative for chest pain.  Gastrointestinal:  Positive for abdominal pain and vomiting. Negative for constipation and hematemesis.  Genitourinary:  Negative for dysuria and hematuria.  Skin:  Negative for rash.    Physical Exam Updated Vital Signs BP (!) 143/75   Pulse 64   Temp 97.7 F (36.5 C) (Oral)   Resp (!) 21   Ht '6\' 2"'  (1.88 m)   Wt 68 kg   SpO2 100%   BMI 19.26 kg/m  Physical Exam Vitals and nursing note reviewed.  Constitutional:      General: He is not in acute distress.    Appearance: Normal appearance. He is well-developed.  HENT:     Head: Normocephalic and atraumatic.  Eyes:     Conjunctiva/sclera: Conjunctivae normal.  Cardiovascular:      Rate and Rhythm: Normal rate and regular rhythm.     Heart sounds: No murmur heard. Pulmonary:     Effort: Pulmonary effort is normal. No respiratory distress.     Breath sounds: Normal breath sounds.  Abdominal:     Palpations: Abdomen is soft.     Tenderness: There is no abdominal tenderness. There is no guarding or rebound.  Musculoskeletal:     Cervical back: Neck supple.     Right lower leg: No edema.     Left lower leg: No edema.  Skin:    General: Skin is warm and dry.     Capillary Refill: Capillary refill takes less than 2 seconds.  Neurological:     Mental Status: He is alert. Mental status is at baseline.  Psychiatric:        Mood and Affect: Mood normal.     ED Results / Procedures / Treatments   Labs (all labs ordered are listed, but only abnormal results are displayed) Labs Reviewed  BASIC METABOLIC PANEL - Abnormal; Notable for the following components:      Result Value   CO2 20 (*)    Glucose, Bld 173 (*)    BUN 25 (*)    Creatinine, Ser 1.72 (*)    GFR, Estimated 43 (*)    All other components within normal limits  CBC - Abnormal; Notable for the following components:   MCH 24.8 (*)    RDW 15.9 (*)    All other components within normal limits  URINALYSIS, ROUTINE W REFLEX MICROSCOPIC - Abnormal; Notable for the following components:   APPearance CLOUDY (*)    Glucose, UA >=500 (*)    Hgb urine dipstick MODERATE (*)    Ketones, ur 20 (*)    Protein, ur 30 (*)    Leukocytes,Ua LARGE (*)    WBC, UA >50 (*)    Bacteria, UA RARE (*)    All other components within normal limits  HEPATIC FUNCTION PANEL - Abnormal; Notable for the following components:   AST 84 (*)    ALT 81 (*)    Total Bilirubin 1.3 (*)    Bilirubin, Direct 0.8 (*)    All other components within normal limits  LIPASE, BLOOD - Abnormal; Notable for the following components:   Lipase 3,579 (*)    All other components within normal limits  PROTIME-INR - Abnormal; Notable for the  following components:   Prothrombin Time 23.1 (*)    INR 2.1 (*)    All other components within normal limits  COMPREHENSIVE METABOLIC PANEL - Abnormal; Notable for the following components:   Potassium 3.3 (*)    Chloride 117 (*)    CO2 16 (*)    Calcium  6.5 (*)    Total Protein 5.3 (*)    Albumin 2.2 (*)    AST 87 (*)    ALT 74 (*)    Total Bilirubin 1.7 (*)    All other components within normal limits  MAGNESIUM - Abnormal; Notable for the following components:   Magnesium 1.6 (*)    All other components within normal limits  CBC - Abnormal; Notable for the following components:   WBC 11.7 (*)    Hemoglobin 11.6 (*)    HCT 37.7 (*)    MCH 24.6 (*)    RDW 15.9 (*)    All other components within normal limits  PROTIME-INR - Abnormal; Notable for the following components:   Prothrombin Time 31.7 (*)    INR 3.1 (*)    All other components within normal limits  LIPASE, BLOOD - Abnormal; Notable for the following components:   Lipase 447 (*)    All other components within normal limits  LIPASE, BLOOD - Abnormal; Notable for the following components:   Lipase 330 (*)    All other components within normal limits  CBG MONITORING, ED - Abnormal; Notable for the following components:   Glucose-Capillary 156 (*)    All other components within normal limits  CBG MONITORING, ED - Abnormal; Notable for the following components:   Glucose-Capillary 150 (*)    All other components within normal limits  CBG MONITORING, ED - Abnormal; Notable for the following components:   Glucose-Capillary 110 (*)    All other components within normal limits  TROPONIN I (HIGH SENSITIVITY) - Abnormal; Notable for the following components:   Troponin I (High Sensitivity) 41 (*)    All other components within normal limits  TROPONIN I (HIGH SENSITIVITY) - Abnormal; Notable for the following components:   Troponin I (High Sensitivity) 50 (*)    All other components within normal limits  TROPONIN I  (HIGH SENSITIVITY) - Abnormal; Notable for the following components:   Troponin I (High Sensitivity) 38 (*)    All other components within normal limits  URINE CULTURE  HIV ANTIBODY (ROUTINE TESTING W REFLEX)  TRIGLYCERIDES    EKG EKG Interpretation  Date/Time:  Thursday June 29 2022 14:57:38 EDT Ventricular Rate:  82 PR Interval:  218 QRS Duration: 146 QT Interval:  448 QTC Calculation: 524 R Axis:   -57 Text Interpretation: Sinus rhythm Borderline prolonged PR interval Probable left atrial enlargement Nonspecific IVCD with LAD Probable anteroseptal infarct, recent Lateral leads are also involved Baseline wander in lead(s) II III aVL aVF V3 V4 new t wave inversions Confirmed by Aletta Edouard [phone]) on 06/29/2022 3:59:10 PM  Radiology ECHOCARDIOGRAM COMPLETE  Result Date: 06/30/2022    ECHOCARDIOGRAM REPORT   Patient Name:   Karl [name] Date of Exam: 06/30/2022 Medical Rec #:  [id number]     Height:       74.0 in Accession #:    [id number]    Weight:       150.0 lb Date of Birth:  July 09, 1956    BSA:          1.923 m Patient Age:    39 years      BP:           106/58 mmHg Patient Gender: M             HR:           68 bpm. Exam Location:  Inpatient Procedure: 2D Echo, Cardiac Doppler, Color Doppler and Intracardiac  Opacification Agent Indications:    Congestive Heart Failure I50.9  History:        Patient has prior history of Echocardiogram examinations, most                 recent 06/15/2018. CAD, TIA and Stroke, Signs/Symptoms:Dyspnea;                 Risk Factors:Diabetes and Hypertension.  Sonographer:    Bernadene Person RDCS Referring Phys: [id number] [place]  1. No definitive LV thrombus identified. Left ventricular ejection fraction, by estimation, is 20 to 25%. The left ventricle has severely decreased function. The left ventricle demonstrates global hypokinesis. The left ventricular internal cavity size was moderately dilated. Left ventricular diastolic  parameters are consistent with Grade I diastolic dysfunction (impaired relaxation).  2. Right ventricular systolic function is normal. The right ventricular size is normal. Tricuspid regurgitation signal is inadequate for assessing PA pressure.  3. The mitral valve is normal in structure. No evidence of mitral valve regurgitation.  4. The aortic valve is tricuspid. Aortic valve regurgitation is not visualized.  5. Aneurysm of the ascending aorta, measuring 42 mm.  6. The inferior vena cava is normal in size with greater than 50% respiratory variability, suggesting right atrial pressure of 3 mmHg. Comparison(s): No significant change from prior study. FINDINGS  Left Ventricle: No definitive LV thrombus identified. Left ventricular ejection fraction, by estimation, is 20 to 25%. The left ventricle has severely decreased function. The left ventricle demonstrates global hypokinesis. Definity contrast agent was given IV to delineate the left ventricular endocardial borders. The left ventricular internal cavity size was moderately dilated. There is no left ventricular hypertrophy. Left ventricular diastolic parameters are consistent with Grade I diastolic dysfunction (impaired relaxation). Right Ventricle: The right ventricular size is normal. No increase in right ventricular wall thickness. Right ventricular systolic function is normal. Tricuspid regurgitation signal is inadequate for assessing PA pressure. Left Atrium: Left atrial size was normal in size. Right Atrium: Right atrial size was normal in size. Pericardium: There is no evidence of pericardial effusion. Mitral Valve: The mitral valve is normal in structure. No evidence of mitral valve regurgitation. Tricuspid Valve: The tricuspid valve is normal in structure. Tricuspid valve regurgitation is not demonstrated. Aortic Valve: The aortic valve is tricuspid. Aortic valve regurgitation is not visualized. Pulmonic Valve: The pulmonic valve was normal in structure.  Pulmonic valve regurgitation is not visualized. Aorta: There is an aneurysm involving the ascending aorta measuring 42 mm. Venous: The inferior vena cava is normal in size with greater than 50% respiratory variability, suggesting right atrial pressure of 3 mmHg. IAS/Shunts: The interatrial septum was not well visualized.  LEFT VENTRICLE PLAX 2D LVIDd:         6.30 cm      Diastology LVIDs:         5.20 cm      LV e' medial:    6.00 cm/s LV PW:         1.00 cm      LV E/e' medial:  10.3 LV IVS:        1.00 cm      LV e' lateral:   9.68 cm/s LVOT diam:     2.50 cm      LV E/e' lateral: 6.4 LV SV:         114 LV SV Index:   59 LVOT Area:     4.91 cm  LV Volumes (MOD) LV vol d, MOD  A2C: 158.0 ml LV vol d, MOD A4C: 247.0 ml LV vol s, MOD A2C: 99.8 ml LV vol s, MOD A4C: 191.0 ml LV SV MOD A2C:     58.2 ml LV SV MOD A4C:     247.0 ml LV SV MOD BP:      58.9 ml RIGHT VENTRICLE RV S prime:     10.90 cm/s TAPSE (M-mode): 2.4 cm LEFT ATRIUM           Index        RIGHT ATRIUM           Index LA diam:      4.00 cm 2.08 cm/m   RA Area:     17.90 cm LA Vol (A2C): 60.6 ml 31.51 ml/m  RA Volume:   48.30 ml  25.11 ml/m LA Vol (A4C): 57.0 ml 29.64 ml/m  AORTIC VALVE LVOT Vmax:   117.00 cm/s LVOT Vmean:  72.800 cm/s LVOT VTI:    0.232 m  AORTA Ao Root diam: 4.10 cm Ao Asc diam:  4.20 cm MITRAL VALVE MV Area (PHT): 3.31 cm    SHUNTS MV Decel Time: 229 msec    Systemic VTI:  0.23 m MV E velocity: 61.70 cm/s  Systemic Diam: 2.50 cm MV A velocity: 88.30 cm/s MV E/A ratio:  0.70 Landscape architect signed by Phineas Inches Signature Date/Time: 06/30/2022/11:19:05 AM    Final    MR ABDOMEN MRCP W WO CONTAST  Result Date: 06/30/2022 CLINICAL DATA:  Acute pancreatitis EXAM: MRI ABDOMEN WITHOUT AND WITH CONTRAST (INCLUDING MRCP) TECHNIQUE: Multiplanar multisequence MR imaging of the abdomen was performed both before and after the administration of intravenous contrast. Heavily T2-weighted images of the biliary and pancreatic  ducts were obtained, and three-dimensional MRCP images were rendered by post processing. CONTRAST:  11m GADAVIST GADOBUTROL 1 MMOL/ML IV SOLN COMPARISON:  CT abdomen/pelvis dated 06/29/2022 FINDINGS: Motion degraded images. Lower chest: Lung bases are clear. Hepatobiliary: Liver is notable for irregular central cysts which compress the biliary confluence, measuring up to 3.6 cm on the left (series 15/image 10) and 2.9 cm on the right (series 15/image 13), without enhancement or solid component. No delayed peribiliary enhancement to suggest cholangiocarcinoma on imaging. No hepatic steatosis. Distended gallbladder. Mild layering gallbladder sludge (series 7/image 50). No cholelithiasis. Associated mild intrahepatic duct dilatation, favored to be due to mass effect from the central cysts (described above). No extrahepatic ductal dilatation. Common duct measures 5 mm. No choledocholithiasis is seen. Pancreas: Mild peripancreatic fluid along the pancreatic uncinate process and pancreaticoduodenal groove (series 3/images 25-26), suggesting groove pancreatitis. No drainable fluid collection/walled-off necrosis. No ductal dilatation. Spleen:  Within normal limits. Adrenals/Urinary Tract:  Adrenal glands are within normal limits. Subcentimeter interpolar left renal cyst (series 3/image 21). Right kidney is within normal limits. No hydronephrosis. Stomach/Bowel: Stomach is within normal limits. Visualized bowel is unremarkable. Vascular/Lymphatic:  No evidence of abdominal aortic aneurysm. No suspicious abdominal lymphadenopathy. Other:  Insert ascites Musculoskeletal: No focal osseous lesions. 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 No drainable fluid collection/walled-off necrosis. Mild intrahepatic ductal dilatation, favored to be due to mass effect from irregular central cysts compressing the biliary confluence. No findings to suggest cholangiocarcinoma on MR. No intrahepatic ductal dilatation. Common duct measures 5  mm. No choledocholithiasis is seen. Distended gallbladder with mild layering gallbladder sludge. No cholelithiasis or inflammatory changes. Electronically Signed   By: SJulian HyM.D.   On: 06/30/2022 00:40   CT Abdomen Pelvis W Contrast  Result Date: 06/29/2022 CLINICAL DATA:  Nausea, vomiting, right lower quadrant abdominal pain, mid abdominal pain, and weakness for 2 days. EXAM: CT ABDOMEN AND PELVIS WITH CONTRAST TECHNIQUE: Multidetector CT imaging of the abdomen and pelvis was performed using the standard protocol following bolus administration of intravenous contrast. RADIATION DOSE REDUCTION: This exam was performed according to the departmental dose-optimization program which includes automated exposure control, adjustment of the mA and/or kV according to patient size and/or use of iterative reconstruction technique. CONTRAST:  42m OMNIPAQUE IOHEXOL 300 MG/ML  SOLN COMPARISON:  Abdominal radiographs 06/15/2018 FINDINGS: Lower chest: Atelectasis in the lung bases. Moderate-sized esophageal hiatal hernia. Hepatobiliary: The gallbladder is distended. No discrete stones or wall thickening identified. Mild extra and moderate to severe intrahepatic bile duct dilatation. Cystic dilatation of the central bile ducts in the right and left side, largest on the left measuring 4.2 cm diameter. No common duct stones are visualized. Findings could represent biliary obstruction, choledochocele, or possibly cystadenoma. Suggest follow-up with elective MRI for further evaluation. Pancreas: No pancreatic mass or infiltration. No pancreatic ductal dilatation. Spleen: Normal in size without focal abnormality. Adrenals/Urinary Tract: Adrenal glands are unremarkable. Kidneys are normal, without renal calculi, focal lesion, or hydronephrosis. Bladder is unremarkable. Stomach/Bowel: Stomach, small bowel, and colon are not abnormally distended. No wall thickening or inflammatory changes are appreciated. Appendix is  normal. Vascular/Lymphatic: Aortic atherosclerosis. No enlarged abdominal or pelvic lymph nodes. Reproductive: Marked enlargement of the prostate gland, measuring 9.2 cm diameter. Other: No free air or free fluid in the abdomen. Minimal periumbilical hernia containing fat. Musculoskeletal: Degenerative changes.  No acute bony abnormalities. IMPRESSION: 1. Prominent intrahepatic bile duct dilatation with less pronounced extrahepatic bile duct dilatation, gallbladder distention, and central hepatic cystic changes. Differential diagnosis includes biliary obstruction, possibly cholangiocarcinoma or strictures, choledochocele, or possibly cystadenoma. Suggest follow-up with nonemergent MRI/MRCP for further evaluation. 2. No pancreatic mass or ductal dilatation identified. 3. Marked enlargement of the prostate gland measuring 9.2 cm in diameter. 4. Aortic atherosclerosis. 5. Moderate-sized esophageal hiatal hernia. Electronically Signed   By: WLucienne CapersM.D.   On: 06/29/2022 17:22    Procedures Procedures    Medications Ordered in ED Medications  carvedilol (COREG) tablet 12.5 mg (12.5 mg Oral Given 06/30/22 0914)  losartan (COZAAR) tablet 25 mg (25 mg Oral Given 06/30/22 0954)  rosuvastatin (CRESTOR) tablet 20 mg (20 mg Oral Given 06/30/22 0954)  sodium chloride flush (NS) 0.9 % injection 3 mL (3 mLs Intravenous Given 06/30/22 0921)  acetaminophen (TYLENOL) tablet 650 mg (has no administration in time range)    Or  acetaminophen (TYLENOL) suppository 650 mg (has no administration in time range)  0.9 %  sodium chloride infusion (0 mLs Intravenous Stopped 06/30/22 0921)  polyethylene glycol (MIRALAX / GLYCOLAX) packet 17 g (has no administration in time range)  HYDROmorphone (DILAUDID) injection 0.5 mg (has no administration in time range)  insulin aspart (novoLOG) injection 0-9 Units ( Subcutaneous Not Given 06/30/22 0848)  cefTRIAXone (ROCEPHIN) 1 g in sodium chloride 0.9 % 100 mL IVPB (has no  administration in time range)  potassium chloride 10 mEq in 100 mL IVPB (10 mEq Intravenous New Bag/Given 06/30/22 1028)  perflutren lipid microspheres (DEFINITY) IV suspension (5 mLs Intravenous Given 06/30/22 0849)  Warfarin - Pharmacist Dosing Inpatient (0 each Does not apply Hold 06/30/22 1600)  sodium chloride 0.9 % bolus 1,000 mL (0 mLs Intravenous Stopped 06/29/22 1817)  ondansetron (ZOFRAN) injection 4 mg (4 mg Intravenous Given 06/29/22 1615)  fentaNYL (SUBLIMAZE) injection 50 mcg (50 mcg Intravenous Given  06/29/22 1615)  iohexol (OMNIPAQUE) 300 MG/ML solution 80 mL (80 mLs Intravenous Contrast Given 06/29/22 1652)  cefTRIAXone (ROCEPHIN) 1 g in sodium chloride 0.9 % 100 mL IVPB (0 g Intravenous Stopped 06/29/22 2112)  gadobutrol (GADAVIST) 1 MMOL/ML injection 7 mL (7 mLs Intravenous Contrast Given 06/30/22 0008)  magnesium sulfate IVPB 1 g 100 mL (0 g Intravenous Stopped 06/30/22 0920)    ED Course/ Medical Decision Making/ A&P Clinical Course as of 06/30/22 1143  Thu Jun 29, 2022  1831 Patient states he is feeling little better after medications.  Creatinine slightly elevated from priors.  Bicarb a little lower.  INR therapeutic.  No white count.  Have added on LFTs from CT reading concerned about some dilation of hepatic ducts. [MB]  2037 Discussed with Dr. Trilby Drummer [facility] hospitalist who will evaluate patient for admission. [MB]    Clinical Course User Index [MB] Hayden Rasmussen, MD                           Medical Decision Making Amount and/or Complexity of Data Reviewed Labs: ordered. Radiology: ordered.  Risk Prescription drug management. Decision regarding hospitalization.  This patient complains of acute nausea and vomiting abdominal pain may be dysuria; this involves an extensive number of treatment Options and is a complaint that carries with it a high risk of complications and morbidity. The differential includes UTI, infection, diverticulitis, obstruction, peptic ulcer  disease, pancreatitis gastroenteritis  I ordered, reviewed and interpreted labs, which included CBC with normal white count normal hemoglobin, chemistries with low sodium low bicarb elevated TGs, elevated lipase, troponins elevated need to be trended, urinalysis with possible signs of infection sent for culture I ordered medication IV fluids and nausea medication, pain medication with improvement in his symptoms and reviewed PMP when indicated. I ordered imaging studies which included CT abdomen and pelvis and I independently    visualized and interpreted imaging which showed biliary and hepatic dilation no clear stones Additional history obtained from patient's daughter Previous records obtained and reviewed in epic no recent admissions I consulted GI Dr. Watt Climes and [facility] hospitalist Dr. Trilby Drummer and discussed lab and imaging findings and discussed disposition.  Cardiac monitoring reviewed, normal sinus rhythm/sinus bradycardia Social determinants considered, no significant barriers Critical Interventions: None  After the interventions stated above, I reevaluated the patient and found patient be symptomatically improved Admission and further testing considered, he will need admission to the hospital for MRCP further GI work-up.  Patient in agreement with plan.          Final Clinical Impression(s) / ED Diagnoses Final diagnoses:  Idiopathic acute pancreatitis, unspecified complication status  Lower urinary tract infection  Nausea and vomiting, unspecified vomiting type  Elevated troponin    Rx / DC Orders ED Discharge Orders     None         Hayden Rasmussen, MD 06/30/22 1147

## 2022-06-30 ENCOUNTER — Observation Stay (HOSPITAL_COMMUNITY): Payer: Medicare Other

## 2022-06-30 ENCOUNTER — Inpatient Hospital Stay (HOSPITAL_COMMUNITY): Payer: Medicare Other

## 2022-06-30 DIAGNOSIS — E876 Hypokalemia: Secondary | ICD-10-CM | POA: Diagnosis not present

## 2022-06-30 DIAGNOSIS — K7689 Other specified diseases of liver: Secondary | ICD-10-CM | POA: Diagnosis present

## 2022-06-30 DIAGNOSIS — K859 Acute pancreatitis without necrosis or infection, unspecified: Secondary | ICD-10-CM | POA: Diagnosis not present

## 2022-06-30 DIAGNOSIS — B9620 Unspecified Escherichia coli [E. coli] as the cause of diseases classified elsewhere: Secondary | ICD-10-CM | POA: Diagnosis present

## 2022-06-30 DIAGNOSIS — R932 Abnormal findings on diagnostic imaging of liver and biliary tract: Secondary | ICD-10-CM | POA: Diagnosis not present

## 2022-06-30 DIAGNOSIS — I472 Ventricular tachycardia, unspecified: Secondary | ICD-10-CM | POA: Diagnosis not present

## 2022-06-30 DIAGNOSIS — I480 Paroxysmal atrial fibrillation: Secondary | ICD-10-CM | POA: Diagnosis present

## 2022-06-30 DIAGNOSIS — K838 Other specified diseases of biliary tract: Secondary | ICD-10-CM | POA: Diagnosis not present

## 2022-06-30 DIAGNOSIS — R748 Abnormal levels of other serum enzymes: Secondary | ICD-10-CM

## 2022-06-30 DIAGNOSIS — K828 Other specified diseases of gallbladder: Secondary | ICD-10-CM | POA: Diagnosis not present

## 2022-06-30 DIAGNOSIS — N281 Cyst of kidney, acquired: Secondary | ICD-10-CM | POA: Diagnosis not present

## 2022-06-30 DIAGNOSIS — K835 Biliary cyst: Secondary | ICD-10-CM | POA: Diagnosis not present

## 2022-06-30 DIAGNOSIS — G8114 Spastic hemiplegia affecting left nondominant side: Secondary | ICD-10-CM | POA: Diagnosis present

## 2022-06-30 DIAGNOSIS — R791 Abnormal coagulation profile: Secondary | ICD-10-CM | POA: Diagnosis present

## 2022-06-30 DIAGNOSIS — D509 Iron deficiency anemia, unspecified: Secondary | ICD-10-CM | POA: Diagnosis present

## 2022-06-30 DIAGNOSIS — K8500 Idiopathic acute pancreatitis without necrosis or infection: Secondary | ICD-10-CM | POA: Diagnosis present

## 2022-06-30 DIAGNOSIS — E559 Vitamin D deficiency, unspecified: Secondary | ICD-10-CM | POA: Diagnosis present

## 2022-06-30 DIAGNOSIS — R112 Nausea with vomiting, unspecified: Secondary | ICD-10-CM | POA: Diagnosis not present

## 2022-06-30 DIAGNOSIS — I5022 Chronic systolic (congestive) heart failure: Secondary | ICD-10-CM | POA: Diagnosis not present

## 2022-06-30 DIAGNOSIS — I130 Hypertensive heart and chronic kidney disease with heart failure and stage 1 through stage 4 chronic kidney disease, or unspecified chronic kidney disease: Secondary | ICD-10-CM | POA: Diagnosis present

## 2022-06-30 DIAGNOSIS — I429 Cardiomyopathy, unspecified: Secondary | ICD-10-CM | POA: Diagnosis present

## 2022-06-30 DIAGNOSIS — I248 Other forms of acute ischemic heart disease: Secondary | ICD-10-CM | POA: Diagnosis present

## 2022-06-30 DIAGNOSIS — I69354 Hemiplegia and hemiparesis following cerebral infarction affecting left non-dominant side: Secondary | ICD-10-CM | POA: Diagnosis not present

## 2022-06-30 DIAGNOSIS — R109 Unspecified abdominal pain: Secondary | ICD-10-CM | POA: Diagnosis not present

## 2022-06-30 DIAGNOSIS — K831 Obstruction of bile duct: Secondary | ICD-10-CM | POA: Diagnosis present

## 2022-06-30 DIAGNOSIS — I5042 Chronic combined systolic (congestive) and diastolic (congestive) heart failure: Secondary | ICD-10-CM | POA: Diagnosis present

## 2022-06-30 DIAGNOSIS — N390 Urinary tract infection, site not specified: Secondary | ICD-10-CM | POA: Diagnosis present

## 2022-06-30 DIAGNOSIS — I69393 Ataxia following cerebral infarction: Secondary | ICD-10-CM | POA: Diagnosis not present

## 2022-06-30 DIAGNOSIS — E785 Hyperlipidemia, unspecified: Secondary | ICD-10-CM | POA: Diagnosis present

## 2022-06-30 DIAGNOSIS — N1831 Chronic kidney disease, stage 3a: Secondary | ICD-10-CM | POA: Diagnosis present

## 2022-06-30 DIAGNOSIS — I2510 Atherosclerotic heart disease of native coronary artery without angina pectoris: Secondary | ICD-10-CM | POA: Diagnosis present

## 2022-06-30 DIAGNOSIS — E1122 Type 2 diabetes mellitus with diabetic chronic kidney disease: Secondary | ICD-10-CM | POA: Diagnosis present

## 2022-06-30 DIAGNOSIS — N400 Enlarged prostate without lower urinary tract symptoms: Secondary | ICD-10-CM | POA: Diagnosis present

## 2022-06-30 LAB — COMPREHENSIVE METABOLIC PANEL
ALT: 74 U/L — ABNORMAL HIGH (ref 0–44)
AST: 87 U/L — ABNORMAL HIGH (ref 15–41)
Albumin: 2.2 g/dL — ABNORMAL LOW (ref 3.5–5.0)
Alkaline Phosphatase: 105 U/L (ref 38–126)
Anion gap: 7 (ref 5–15)
BUN: 22 mg/dL (ref 8–23)
CO2: 16 mmol/L — ABNORMAL LOW (ref 22–32)
Calcium: 6.5 mg/dL — ABNORMAL LOW (ref 8.9–10.3)
Chloride: 117 mmol/L — ABNORMAL HIGH (ref 98–111)
Creatinine, Ser: 1.18 mg/dL (ref 0.61–1.24)
GFR, Estimated: >60 mL/min (ref >60)
Glucose, Bld: 90 mg/dL (ref 70–99)
Potassium: 3.3 mmol/L — ABNORMAL LOW (ref 3.5–5.1)
Sodium: 140 mmol/L (ref 135–145)
Total Bilirubin: 1.7 mg/dL — ABNORMAL HIGH (ref 0.3–1.2)
Total Protein: 5.3 g/dL — ABNORMAL LOW (ref 6.5–8.1)

## 2022-06-30 LAB — HIV ANTIBODY (ROUTINE TESTING W REFLEX): HIV Screen 4th Generation wRfx: NONREACTIVE

## 2022-06-30 LAB — LIPASE, BLOOD
Lipase: 447 U/L — ABNORMAL HIGH (ref 11–51)
Lipase: 330 U/L — ABNORMAL HIGH (ref 11–51)

## 2022-06-30 LAB — GLUCOSE, CAPILLARY
Glucose-Capillary: 101 mg/dL — ABNORMAL HIGH (ref 70–99)
Glucose-Capillary: 131 mg/dL — ABNORMAL HIGH (ref 70–99)

## 2022-06-30 LAB — PROTIME-INR
INR: 3.1 — ABNORMAL HIGH (ref 0.8–1.2)
Prothrombin Time: 31.7 seconds — ABNORMAL HIGH (ref 11.4–15.2)

## 2022-06-30 LAB — ECHOCARDIOGRAM COMPLETE
Area-P 1/2: 3.31 cm2
Calc EF: 29.7 %
Height: 74 in
S' Lateral: 5.2 cm
Single Plane A2C EF: 36.8 %
Single Plane A4C EF: 22.7 %
Weight: 2400 oz

## 2022-06-30 LAB — TROPONIN I (HIGH SENSITIVITY): Troponin I (High Sensitivity): 38 ng/L — ABNORMAL HIGH (ref <18)

## 2022-06-30 LAB — CBC
HCT: 37.7 % — ABNORMAL LOW (ref 39.0–52.0)
Hemoglobin: 11.6 g/dL — ABNORMAL LOW (ref 13.0–17.0)
MCH: 24.6 pg — ABNORMAL LOW (ref 26.0–34.0)
MCHC: 30.8 g/dL (ref 30.0–36.0)
MCV: 80 fL (ref 80.0–100.0)
Platelets: 216 10*3/uL (ref 150–400)
RBC: 4.71 MIL/uL (ref 4.22–5.81)
RDW: 15.9 % — ABNORMAL HIGH (ref 11.5–15.5)
WBC: 11.7 10*3/uL — ABNORMAL HIGH (ref 4.0–10.5)
nRBC: 0 % (ref 0.0–0.2)

## 2022-06-30 LAB — MAGNESIUM: Magnesium: 1.6 mg/dL — ABNORMAL LOW (ref 1.7–2.4)

## 2022-06-30 LAB — TRIGLYCERIDES: Triglycerides: 93 mg/dL (ref <150)

## 2022-06-30 LAB — CBG MONITORING, ED
Glucose-Capillary: 110 mg/dL — ABNORMAL HIGH (ref 70–99)
Glucose-Capillary: 126 mg/dL — ABNORMAL HIGH (ref 70–99)

## 2022-06-30 MED ORDER — WARFARIN - PHARMACIST DOSING INPATIENT: Freq: Every day | Status: DC

## 2022-06-30 MED ORDER — ORAL CARE MOUTH RINSE: 15.0000 mL | OROMUCOSAL | Status: DC | PRN

## 2022-06-30 MED ORDER — PERFLUTREN LIPID MICROSPHERE
1.0000 mL | INTRAVENOUS | Status: AC | PRN
  Administered 2022-06-30: 5 mL via INTRAVENOUS

## 2022-06-30 MED ORDER — MAGNESIUM SULFATE IN D5W 1-5 GM/100ML-% IV SOLN
1.0000 g | Freq: Once | INTRAVENOUS | Status: AC
Start: 2022-06-30 — End: 2022-06-30
  Administered 2022-06-30: 1 g via INTRAVENOUS
  Filled 2022-06-30: qty 100

## 2022-06-30 MED ORDER — CARVEDILOL 3.125 MG PO TABS
3.1250 mg | ORAL_TABLET | Freq: Two times a day (BID) | ORAL | Status: DC
  Administered 2022-06-30 – 2022-07-04 (×5): 3.125 mg via ORAL
  Filled 2022-06-30 (×7): qty 1

## 2022-06-30 MED ORDER — POTASSIUM CHLORIDE 10 MEQ/100ML IV SOLN
10.0000 meq | INTRAVENOUS | Status: AC
  Administered 2022-06-30 (×5): 10 meq via INTRAVENOUS
  Filled 2022-06-30 (×5): qty 100

## 2022-06-30 NOTE — Progress Notes (Addendum)
Xcover  15s run of Vtach asymptomatic per [employer] tele, add magnesium to AM labs (cbc, cmp, trop)  Addendum, Mag=1.6 , Mag sulfate 1gm iv x1 ordered

## 2022-06-30 NOTE — Progress Notes (Signed)
  Echocardiogram 2D Echocardiogram has been performed.  Augustine Radar 06/30/2022, 8:48 AM

## 2022-06-30 NOTE — Progress Notes (Signed)
ANTICOAGULATION CONSULT NOTE  Pharmacy Consult for warfarin Indication: hx stroke  Allergies  Allergen Reactions   Lipitor [Atorvastatin] Nausea And Vomiting    Patient Measurements: Height: 6\' 2"  (188 cm) Weight: 68 kg (150 lb) IBW/kg (Calculated) : 82.2 Heparin Dosing Weight:   Vital Signs: Temp: 97.9 F (36.6 C) (07/28 0711) Temp Source: Oral (07/28 0311) BP: 110/65 (07/28 0914) Pulse Rate: 61 (07/28 0914)  Labs: Recent Labs    06/29/22 1520 06/29/22 2130 06/29/22 2318 06/30/22 0510 06/30/22 0537  HGB 13.2  --   --   --  11.6*  HCT 42.7  --   --   --  37.7*  PLT 229  --   --   --  216  LABPROT 23.1*  --   --   --  31.7*  INR 2.1*  --   --   --  3.1*  CREATININE 1.72*  --   --  1.18  --   TROPONINIHS  --  41* 50* 38*  --      Estimated Creatinine Clearance: 58.4 mL/min (by C-G formula based on SCr of 1.18 mg/dL).   Medications:  - PTA warfarin regimen: 5mg  daily except 7.5 mg on Mon and Fri (last dose taken at 8a on 06/29/22 PTA)  Assessment: Patient is a 66 y.o M with hx CVA on warfarin  PTA who presented to the ED on 06/29/22 with c/o abdominal pain, n/v and generalized weakness.  Pharmacy has been consulted to resume warfarin.  Today, 06/30/2022: - INR is therapeutic at 3.1, supratherapeutic and large increase from 2.1 yesterday  - cbc ok, Hgb dropped some to 11.6, likely hemodilution    Goal of Therapy:  INR 2-3 Monitor platelets by anticoagulation protocol: Yes   Plan:  - Hold warfarin dose due to increase in INR today  - Daily INR    07/01/22, PharmD, BCPS 06/30/2022 10:03 AM

## 2022-06-30 NOTE — ED Notes (Signed)
Patient back from MRI.

## 2022-06-30 NOTE — ED Notes (Signed)
Patient had an approx 15 second run of v-tach.  MD Selena Batten made aware.

## 2022-06-30 NOTE — Consult Note (Signed)
Consultation Note   Referring Provider: [facility] PCP: Karl Rail, MD Primary Gastroenterologist: unassigned Karl [name] PCP) Reason for consultation: biliary duct dilation  Hospital Day: 2  Assessment / Plan    # Acute nausea / vomiting / Acute pancreatitis. 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 Etiology of pancreatitis?  Cannot exclude biliary cause even though only gallbladder sludge seen on  ( no cholelithiasis) and no evidence for choledocholithiasis with CBD of 5 mm. He doesn't consume Etoh. Medication induced pancreatitis seems unlikely. Triglycerides normal. Serum ca+ is low ( albumin 2.2) May get an [place] to evaluate for cholelithiasis. Tbili 1.7 today. It was 1.3 yesterday and predominantly direct.  Supportive care for pancreatitis. He got a liter bolus. Has history of cardiomyopathy. Received several hours of NS at 100 ml / hr ( now stopped). His is Hct is okay at 37%. Renal function is normal. Lipase has improved today.    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   The irregular central cysts compress the biliary confluence, measure up to 3.6 cm on the left and 2.9 cm on the right without enhancement or solid component. No delayed peri-biliary enhancement to suggest cholangiocarcinoma . Regarding intrahepatic ductal dilation, Radiology feels it is probably related to mass effect from central liver cysts.  Dr. 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  Unfortunately there aren't any previous imaging studies to compare. I did speak with one of the Radiologist this am and it doesn't appear that patient has an anomalous pancreatic biliary junction.   May need Hepatobiliary Surgery consult.    # Mild anemia, borderline microcytic Will obtain iron studies     # CAD s/p remote stent placement   Cardiomyopathy. EF 20% in July 2019. Repeat echo done today, results pending  # Chronic anticoagulation , on Warfarin.  INR 3.1  # Prostatomegaly  # See PMH for additional medical problems    HPI   Karl [name] is a pleasant 66 y.o. male with a past medical history significant for  HTN, CAD , cardiomyopathy, DM, HLD, BPH, CVA with residual left sided deficits, chronic anticoagulation with warfarin. See PMH for any additional medical problems.   Patient presented to ED yesterday for evaluation of nausea / vomiting and abdominal pain.  Lipase was 3500. Tbili 1.3, normal alk phos. Transaminases in 80's.  CT scan >>   Moderate hiatal hernia.   Prominent intrahepatic bile duct dilatation, gallbladder distention, and central hepatic cystic changes.  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 No drainable fluid collection/walled-off necrosis. Mild intrahepatic ductal dilatation, favored to be due to mass effect from irregular central cysts compressing the biliary confluence.  Common duct measures 5 mm. No choledocholithiasis is seen. Distended gallbladder with mild layering gallbladder sludge. No cholelithiasis or inflammatory changes   Karl [name] tells me he felt fine leading up to yesterday when he developed nausea / vomiting. Interestingly, he says he hasn't really had any associated abdominal pain. He denies any chronic GI problems. Describes regular bowel movements. No blood in stool. No GERD symptoms. His weight is stable.  He does sometimes see blood in his urine.    Patient hasn't had Etoh in years. He stopped smoking years ago.    Previous GI Evaluation     No endoscopies   Recent Labs and Imaging Karl ABDOMEN MRCP W WO CONTAST  Result Date: 06/30/2022 CLINICAL DATA:  Acute pancreatitis EXAM: MRI ABDOMEN WITHOUT AND WITH CONTRAST (INCLUDING MRCP) TECHNIQUE: Multiplanar multisequence Karl imaging of the abdomen was performed both before and after the  administration of intravenous contrast. Heavily T2-weighted images of the biliary and pancreatic ducts were obtained, and three-dimensional MRCP images were rendered by post processing. CONTRAST:  75m GADAVIST GADOBUTROL 1 MMOL/ML IV SOLN COMPARISON:  CT abdomen/pelvis dated 06/29/2022 FINDINGS: Motion degraded images. Lower chest: Lung bases are clear. Hepatobiliary: Liver is notable for irregular central cysts which compress the biliary confluence, measuring up to 3.6 cm on the left (series 15/image 10) and 2.9 cm on the right (series 15/image 13), without enhancement or solid component. No delayed peribiliary enhancement to suggest cholangiocarcinoma on imaging. No hepatic steatosis. Distended gallbladder. Mild layering gallbladder sludge (series 7/image 50). No cholelithiasis. Associated mild intrahepatic duct dilatation, favored to be due to mass effect from the central cysts (described above). No extrahepatic ductal dilatation. Common duct measures 5 mm. No choledocholithiasis is seen. Pancreas: Mild peripancreatic fluid along the pancreatic uncinate process and pancreaticoduodenal groove (series 3/images 25-26), suggesting groove pancreatitis. No drainable fluid collection/walled-off necrosis. No ductal dilatation. Spleen:  Within normal limits. Adrenals/Urinary Tract:  Adrenal glands are within normal limits. Subcentimeter interpolar left renal cyst (series 3/image 21). Right kidney is within normal limits. No hydronephrosis. Stomach/Bowel: Stomach is within normal limits. Visualized bowel is unremarkable. Vascular/Lymphatic:  No evidence of abdominal aortic aneurysm. No suspicious abdominal lymphadenopathy. Other:  Insert ascites Musculoskeletal: No focal osseous lesions. 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 No drainable fluid collection/walled-off necrosis. Mild intrahepatic ductal dilatation, favored to be due to mass effect from irregular central cysts compressing the biliary confluence. No  findings to suggest cholangiocarcinoma on Karl. No intrahepatic ductal dilatation. Common duct measures 5 mm. No choledocholithiasis is seen. Distended gallbladder with mild layering gallbladder sludge. No cholelithiasis or inflammatory changes. Electronically Signed   By: SJulian HyM.D.   On: 06/30/2022 00:40   CT Abdomen Pelvis W Contrast  Result Date: 06/29/2022 CLINICAL DATA:  Nausea, vomiting, right lower quadrant abdominal pain, mid abdominal pain, and weakness for 2 days. EXAM: CT ABDOMEN AND PELVIS WITH CONTRAST TECHNIQUE: Multidetector CT imaging of the abdomen and pelvis was performed using the standard protocol following bolus administration of intravenous contrast. RADIATION DOSE REDUCTION: This exam was performed according to the departmental dose-optimization program which includes automated exposure control, adjustment of the mA and/or kV according to patient size and/or use of iterative reconstruction technique. CONTRAST:  885mOMNIPAQUE IOHEXOL 300 MG/ML  SOLN COMPARISON:  Abdominal radiographs 06/15/2018 FINDINGS: Lower chest: Atelectasis in the lung bases. Moderate-sized esophageal hiatal hernia. Hepatobiliary: The gallbladder is distended. No discrete stones or wall thickening identified. Mild extra and moderate to severe intrahepatic bile duct dilatation. Cystic dilatation of the central bile ducts in the right and left side, largest on the left measuring 4.2 cm diameter. No common duct stones are visualized. Findings could represent biliary obstruction, choledochocele, or possibly cystadenoma. Suggest follow-up with elective MRI for further evaluation. Pancreas: No pancreatic mass or infiltration. No pancreatic ductal dilatation. Spleen: Normal in size without focal abnormality. Adrenals/Urinary Tract: Adrenal glands are unremarkable. Kidneys are normal, without renal calculi, focal lesion, or hydronephrosis. Bladder  is unremarkable. Stomach/Bowel: Stomach, small bowel, and colon are  not abnormally distended. No wall thickening or inflammatory changes are appreciated. Appendix is normal. Vascular/Lymphatic: Aortic atherosclerosis. No enlarged abdominal or pelvic lymph nodes. Reproductive: Marked enlargement of the prostate gland, measuring 9.2 cm diameter. Other: No free air or free fluid in the abdomen. Minimal periumbilical hernia containing fat. Musculoskeletal: Degenerative changes.  No acute bony abnormalities. IMPRESSION: 1. Prominent intrahepatic bile duct dilatation with less pronounced extrahepatic bile duct dilatation, gallbladder distention, and central hepatic cystic changes. Differential diagnosis includes biliary obstruction, possibly cholangiocarcinoma or strictures, choledochocele, or possibly cystadenoma. Suggest follow-up with nonemergent MRI/MRCP for further evaluation. 2. No pancreatic mass or ductal dilatation identified. 3. Marked enlargement of the prostate gland measuring 9.2 cm in diameter. 4. Aortic atherosclerosis. 5. Moderate-sized esophageal hiatal hernia. Electronically Signed   By: Lucienne Capers M.D.   On: 06/29/2022 17:22    Labs:  Recent Labs    06/29/22 1520 06/30/22 0537  WBC 9.0 11.7*  HGB 13.2 11.6*  HCT 42.7 37.7*  PLT 229 216   Recent Labs    06/29/22 1520 06/30/22 0510  NA 140 140  K 4.4 3.3*  CL 109 117*  CO2 20* 16*  GLUCOSE 173* 90  BUN 25* 22  CREATININE 1.72* 1.18  CALCIUM 9.7 6.5*   Recent Labs    06/29/22 1520 06/30/22 0510  PROT 8.0 5.3*  ALBUMIN 3.6 2.2*  AST 84* 87*  ALT 81* 74*  ALKPHOS 103 105  BILITOT 1.3* 1.7*  BILIDIR 0.8*  --   IBILI 0.5  --    No results for input(s): "HEPBSAG", "HCVAB", "HEPAIGM", "HEPBIGM" in the last 72 hours. 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 Lovena Le 15 years 1998   Dyspnea    Hypertension    Stroke [facility]) 2019   denies residual on 06/14/2018   TIA (transient ischemic attack) 12/19/2017   Type II diabetes mellitus ([place]) 07/2014 dx    Past Surgical History:  Procedure Laterality Date   CORONARY ANGIOPLASTY WITH STENT PLACEMENT  1998   FRACTURE SURGERY     GREEN LIGHT LASER TURP (TRANSURETHRAL RESECTION OF PROSTATE N/A 06/05/2018   Procedure: GREEN LIGHT LASER TURP , TRANSURETHRAL RESECTION OF PROSTATE;  Surgeon: [name], MD;  Location: [facility];  Service: Urology;  Laterality: N/A;   TIBIA FRACTURE SURGERY Left 1980s    Family History  Problem Relation Age of Onset   Diabetes Mother    Diabetes Father    Stroke Father 2   Diabetes Sister    Clotting disorder Sister 30   Diabetes Other        nephew   Diabetes Brother    Diabetes Maternal Grandmother    Colon cancer Neg Hx    Esophageal cancer Neg Hx    Pancreatic cancer Neg Hx    Stomach cancer Neg Hx    Liver disease Neg Hx     Prior to Admission medications   Medication Sig Start Date End Date Taking? Authorizing Provider  acetaminophen (TYLENOL) 325 MG tablet Take 1-2 tablets (325-650 mg total) by mouth every 4 (four) hours as needed for mild pain. 07/09/18  Yes Love, Ivan Anchors, PA-C  carvedilol (  COREG) 12.5 MG tablet TAKE 1 TABLET BY MOUTH TWICE DAILY WITH MEALS Patient taking differently: Take 12.5 mg by mouth 2 (two) times daily with a meal. 09/13/21  Yes Burns, Claudina Lick, MD  empagliflozin (JARDIANCE) 10 MG TABS tablet Take 1 tablet (10 mg total) by mouth daily before breakfast. 03/27/22  Yes Burns, Claudina Lick, MD  losartan (COZAAR) 25 MG tablet Take 1 tablet (25 mg total) by mouth daily. 09/13/21  Yes Burns, Claudina Lick, MD  metFORMIN (GLUCOPHAGE) 1000 MG tablet Take 1 tablet (1,000 mg total) by mouth 2 (two) times daily with a meal. 09/13/21  Yes Burns, Claudina Lick, MD  potassium chloride (KLOR-CON M) 10 MEQ tablet Take 1 tablet by mouth twice daily 06/21/22   Yes Burns, Claudina Lick, MD  rosuvastatin (CRESTOR) 20 MG tablet Take 1 tablet by mouth once daily Patient taking differently: Take 20 mg by mouth daily. 05/02/22  Yes Evans Lance, MD  warfarin (COUMADIN) 5 MG tablet TAKE 1 TO 1 & 1/2 (ONE & ONE-HALF) TABLETS BY MOUTH ONCE DAILY AS DIRECTED BY COUMADIN CLINIC Patient taking differently: Take 5-7.5 mg by mouth See admin instructions. Take one tablet (82m) by mouth every day except on Mondays and Fridays take 1& 1/2 (7.5 mg) by mouth per patient 04/24/22  Yes TEvans Lance MD  blood glucose meter kit and supplies KIT Dispense based on patient and insurance preference. Use up to four times daily as directed. (FOR E11.9). 07/15/18   BBinnie Rail MD  Lancets 30G MISC Use to check blood sugars daily 07/15/18   BBinnie Rail MD    Current Facility-Administered Medications  Medication Dose Route Frequency Provider Last Rate Last Admin   acetaminophen (TYLENOL) tablet 650 mg  650 mg Oral Q6H PRN MMarcelyn Bruins MD       Or   acetaminophen (TYLENOL) suppository 650 mg  650 mg Rectal Q6H PRN MMarcelyn Bruins MD       carvedilol (COREG) tablet 12.5 mg  12.5 mg Oral BID WC MMarcelyn Bruins MD   12.5 mg at 06/30/22 0914   cefTRIAXone (ROCEPHIN) 1 g in sodium chloride 0.9 % 100 mL IVPB  1 g Intravenous Q24H MMarcelyn Bruins MD       HYDROmorphone (DILAUDID) injection 0.5 mg  0.5 mg Intravenous Q4H PRN MMarcelyn Bruins MD       insulin aspart (novoLOG) injection 0-9 Units  0-9 Units Subcutaneous TID WC MMarcelyn Bruins MD       losartan (COZAAR) tablet 25 mg  25 mg Oral Daily MMarcelyn Bruins MD       perflutren lipid microspheres (DEFINITY) IV suspension  1-10 mL Intravenous PRN MMarcelyn Bruins MD   5 mL at 06/30/22 0849   polyethylene glycol (MIRALAX / GLYCOLAX) packet 17 g  17 g Oral Daily PRN MMarcelyn Bruins MD       potassium chloride 10 mEq in 100 mL IVPB  10 mEq Intravenous Q1 Hr x 5 Adhikari, Amrit, MD 100  mL/hr at 06/30/22 0918 10 mEq at 06/30/22 0918   rosuvastatin (CRESTOR) tablet 20 mg  20 mg Oral Daily MMarcelyn Bruins MD       sodium chloride flush (NS) 0.9 % injection 3 mL  3 mL Intravenous Q12H MMarcelyn Bruins MD   3 mL at 06/30/22 07680  Current Outpatient Medications  Medication Sig Dispense Refill   acetaminophen (TYLENOL) 325 MG tablet Take 1-2 tablets (325-650 mg  total) by mouth every 4 (four) hours as needed for mild pain.     carvedilol (COREG) 12.5 MG tablet TAKE 1 TABLET BY MOUTH TWICE DAILY WITH MEALS (Patient taking differently: Take 12.5 mg by mouth 2 (two) times daily with a meal.) 180 tablet 2   empagliflozin (JARDIANCE) 10 MG TABS tablet Take 1 tablet (10 mg total) by mouth daily before breakfast. 90 tablet 1   losartan (COZAAR) 25 MG tablet Take 1 tablet (25 mg total) by mouth daily. 90 tablet 2   metFORMIN (GLUCOPHAGE) 1000 MG tablet Take 1 tablet (1,000 mg total) by mouth 2 (two) times daily with a meal. 180 tablet 2   potassium chloride (KLOR-CON M) 10 MEQ tablet Take 1 tablet by mouth twice daily 180 tablet 0   rosuvastatin (CRESTOR) 20 MG tablet Take 1 tablet by mouth once daily (Patient taking differently: Take 20 mg by mouth daily.) 90 tablet 0   warfarin (COUMADIN) 5 MG tablet TAKE 1 TO 1 & 1/2 (ONE & ONE-HALF) TABLETS BY MOUTH ONCE DAILY AS DIRECTED BY COUMADIN CLINIC (Patient taking differently: Take 5-7.5 mg by mouth See admin instructions. Take one tablet (42m) by mouth every day except on Mondays and Fridays take 1& 1/2 (7.5 mg) by mouth per patient) 60 tablet 1   blood glucose meter kit and supplies KIT Dispense based on patient and insurance preference. Use up to four times daily as directed. (FOR E11.9). 1 each 0   Lancets 30G MISC Use to check blood sugars daily 100 each 3    Allergies as of 06/29/2022 - Review Complete 06/29/2022  Allergen Reaction Noted   Lipitor [atorvastatin] Nausea And Vomiting 07/29/2012    Social History   Socioeconomic  History   Marital status: Married    Spouse name: Not on file   Number of children: Not on file   Years of education: Not on file   Highest education level: Not on file  Occupational History   Not on file  Tobacco Use   Smoking status: Former    Packs/day: 2.00    Years: 5.00    Total pack years: 10.00    Types: Cigarettes    Quit date: 12/04/2000    Years since quitting: 21.5   Smokeless tobacco: Never  Vaping Use   Vaping Use: Never used  Substance and Sexual Activity   Alcohol use: Not Currently   Drug use: Not Currently   Sexual activity: Not on file  Other Topics Concern   Not on file  Social History Narrative   Former smoker - quit 1998 after stent -    Lives with dtr and 2 g-sons   Separated from wife in 2005, good terms   Employed with [employer]- walks at lot at work   No regular exercise   Social Determinants of HRadio broadcast assistantStrain: Not on file  Food Insecurity: Not on file  Transportation Needs: Not on file  Physical Activity: Not on file  Stress: Not on file  Social Connections: Not on file  Intimate Partner Violence: Not on file    Review of Systems: All systems reviewed and negative except where noted in HPI.  Physical Exam: Vital signs in last 24 hours: Temp:  [97.7 F (36.5 C)-98.3 F (36.8 C)] 97.9 F (36.6 C) (07/28 0711) Pulse Rate:  [61-126] 61 (07/28 0914) Resp:  [12-32] 19 (07/28 0715) BP: (85-160)/(52-91) 110/65 (07/28 0914) SpO2:  [94 %-100 %] 97 % (07/28 0715)  Weight:  [68 kg] 68 kg (07/27 1455)    General:  Alert thin male in NAD Psych:  Pleasant, cooperative. Normal mood and affect Eyes: Pupils equal, no icterus. Conjunctive pink Ears:  Normal auditory acuity Nose: No deformity, discharge or lesions Neck:  Supple, no masses felt Lungs:  Clear to auscultation.  Heart:  Regular rate, regular rhythm. No lower extremity edema Abdomen:  Soft, nondistended, nontender, active bowel sounds, no  masses felt Rectal :  Deferred Msk: Symmetrical without gross deformities.  Neurologic:  Alert, oriented, grossly normal neurologically Skin:  Intact without significant lesions.    Intake/Output from previous day: 07/27 0701 - 07/28 0700 In: 1000 [IV Piggyback:1000] Out: 1000 [Urine:1000] Intake/Output this shift:  Total I/O In: 1100 [I.V.:1000; IV Piggyback:100] Out: -     Principal Problem:   Biliary obstruction Active Problems:   Essential hypertension   Chronic systolic heart failure (HCC)   Diabetes ([place])   Dyslipidemia   BPH without urinary obstruction   Coronary artery disease   History of CVA (cerebrovascular accident)   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   Spastic hemiparesis ([place])   Pancreatitis   Suspected UTI    Tye Savoy, NP-C @  06/30/2022, 9:50 AM

## 2022-06-30 NOTE — Progress Notes (Signed)
PROGRESS NOTE  Karl [name]  [id number] DOB: 1956-02-06 DOA: 06/29/2022 PCP: Karl Rail, MD   Brief Narrative:  Patient is a 66 year old male with history of hypertension, severe combined systolic/diastolic congestive heart failure with EF of  20%, stroke with left-sided residual weakness, coronary disease, diabetes type 2, hyperlipidemia who presented from home with complaint of weakness, nausea, vomiting along with abdominal pain.  On presentation, he was hemodynamically stable.  Lab work showed creatinine of 1.7.  Mildly elevated LFTs.  Lipase was elevated in the range of 3000's.  UA was suspicious for UTI.  CT abdomen/pelvis showed prominent intrahepatic bile duct dilatation.  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 No drainable fluid collection/walled-off necrosis,mild intrahepatic ductal dilatation,Normal CBD, distended gallbladder without signs of inflammation.Patient was admitted for the management of idiopathic pancreatitis.  GI consulted.  Assessment & Plan:  Principal Problem:   Biliary obstruction Active Problems:   Essential hypertension   Chronic systolic heart failure (HCC)   Diabetes (HCC)   Dyslipidemia   BPH without urinary obstruction   Coronary artery disease   History of CVA (cerebrovascular accident)   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   Spastic hemiparesis (HCC)   Pancreatitis   Suspected UTI   Pancreatitis/elevated lipase: Unclear etiology.  Patient does not consume alcohol.  Triglyceride level normal.  No contributing new medication that could cause pancreatitis.  When he presented he complained of lower abdominal pain instead of epigastric pain. Lipase level has significantly improved today.  He denies any abdominal pain today.  Dilated biliary ducts: Imaging as above.  GI consulted.  Mild elevated liver enzymes.  Bilirubin almost normal.  Normal CBD.  Distended gallbladder without signs of inflammation.  Possibility of passage of stone.  Severe  systolic congestive/diastolic heart failure: Follows with cardiology.  Echo done here showed EF of 20 to 25%.  Currently appears euvolemic. Monitor on telemetry.  He had 15 runs of V. tach this morning  History of coronary artery disease/elevated troponin: Elevated troponin most likely from demand ischemia from severe CHF.  He does not have any chest pain.  Troponins are flat trend He is on carvedilol, losartan, statin at home  Suspicion for UTI: Started on ceftriaxone.  UA showed some bacteria, leukocytes.  He was complaining of lower abdominal discomfort/burning while passing urine.  Follow-up urine culture  History of CVA: On warfarin.  Has residual mild left-sided weakness.  Ambulates with the help of cane.  On statin at home  History of paroxysmal A-fib: Currently in normal sinus rhythm.  On warfarin for anticoagulation.  Monitor on telemetry  Hypertension: On  carvedilol, losartan at home, continue low-dose carvedilol here due to soft blood pressure.  Losartan on hold.  Diabetes type 2: On sliding scale insulin.  Monitor blood sugars  Hyperlipidemia: On Crestor.  Can be held now because of mild elevated liver enzymes.  Hypokalemia/hypomagnesemia: Being supplemented and monitor.  Keep potassium more than 4, magnesium more than 2.  Had nonsustained V. tach this morning.         DVT prophylaxis:  Warfarin     Code Status: Full Code  Family Communication: None at the bedside  Patient status: Inpatient  Patient is from :Home  Anticipated discharge NE:[id number]  Estimated DC date:1-2 days   Consultants: GI  Procedures:None  Antimicrobials:  Anti-infectives (From admission, onward)    Start     Dose/Rate Route Frequency Ordered Stop   06/30/22 2000  cefTRIAXone (ROCEPHIN) 1 g in sodium chloride 0.9 %  100 mL IVPB        1 g 200 mL/hr over 30 Minutes Intravenous Every 24 hours 06/29/22 2119     06/29/22 1930  cefTRIAXone (ROCEPHIN) 1 g in sodium chloride 0.9 % 100 mL IVPB         1 g 200 mL/hr over 30 Minutes Intravenous  Once 06/29/22 1915 06/29/22 2112       Subjective: Patient seen and examined at the bedside this morning.  Hemodynamically stable during my evaluation.  Denies any abdomen pain, nausea or vomiting during evaluation.    Objective: Vitals:   06/30/22 1000 06/30/22 1015 06/30/22 1045 06/30/22 1057  BP: (!) 116/58 110/61 (!) 97/56   Pulse: 61 61 (!) 58   Resp: 15 20 17    Temp:    97.7 F (36.5 C)  TempSrc:    Oral  SpO2: 100% 98% 98%   Weight:      Height:        Intake/Output Summary (Last 24 hours) at 06/30/2022 1153 Last data filed at 06/30/2022 1026 Gross per 24 hour  Intake 2200 ml  Output 1000 ml  Net 1200 ml   Filed Weights   06/29/22 1455  Weight: 68 kg    Examination:  General exam: Overall comfortable, not in distress HEENT: PERRL Respiratory system:  no wheezes or crackles  Cardiovascular system: S1 & S2 heard, RRR.  Gastrointestinal system: Abdomen is nondistended, soft and nontender. Central nervous system: Alert and oriented,mild left sided residual weakness Extremities: No edema, no clubbing ,no cyanosis Skin: No rashes, no ulcers,no icterus     Data Reviewed: I have personally reviewed following labs and imaging studies  CBC: Recent Labs  Lab 06/29/22 1520 06/30/22 0537  WBC 9.0 11.7*  HGB 13.2 11.6*  HCT 42.7 37.7*  MCV 80.1 80.0  PLT 229 216   Basic Metabolic Panel: Recent Labs  Lab 06/29/22 1520 06/30/22 0510  NA 140 140  K 4.4 3.3*  CL 109 117*  CO2 20* 16*  GLUCOSE 173* 90  BUN 25* 22  CREATININE 1.72* 1.18  CALCIUM 9.7 6.5*  MG  --  1.6*     No results found for this or any previous visit (from the past 240 hour(s)).   Radiology Studies: ECHOCARDIOGRAM COMPLETE  Result Date: 06/30/2022    ECHOCARDIOGRAM REPORT   Patient Name:   Karl [name] Date of Exam: 06/30/2022 Medical Rec #:  07/02/2022     Height:       74.0 in Accession #:    [id number]    Weight:       150.0 lb  Date of Birth:  1955-12-12    BSA:          1.923 m Patient Age:    80 years      BP:           106/58 mmHg Patient Gender: M             HR:           68 bpm. Exam Location:  Inpatient Procedure: 2D Echo, Cardiac Doppler, Color Doppler and Intracardiac            Opacification Agent Indications:    Congestive Heart Failure I50.9  History:        Patient has prior history of Echocardiogram examinations, most                 recent 06/15/2018. CAD, TIA and Stroke, Signs/Symptoms:Dyspnea;  Risk Factors:Diabetes and Hypertension.  Sonographer:    Bernadene Person RDCS Referring Phys: [id number] [place]  1. No definitive LV thrombus identified. Left ventricular ejection fraction, by estimation, is 20 to 25%. The left ventricle has severely decreased function. The left ventricle demonstrates global hypokinesis. The left ventricular internal cavity size was moderately dilated. Left ventricular diastolic parameters are consistent with Grade I diastolic dysfunction (impaired relaxation).  2. Right ventricular systolic function is normal. The right ventricular size is normal. Tricuspid regurgitation signal is inadequate for assessing PA pressure.  3. The mitral valve is normal in structure. No evidence of mitral valve regurgitation.  4. The aortic valve is tricuspid. Aortic valve regurgitation is not visualized.  5. Aneurysm of the ascending aorta, measuring 42 mm.  6. The inferior vena cava is normal in size with greater than 50% respiratory variability, suggesting right atrial pressure of 3 mmHg. Comparison(s): No significant change from prior study. FINDINGS  Left Ventricle: No definitive LV thrombus identified. Left ventricular ejection fraction, by estimation, is 20 to 25%. The left ventricle has severely decreased function. The left ventricle demonstrates global hypokinesis. Definity contrast agent was given IV to delineate the left ventricular endocardial borders. The left ventricular  internal cavity size was moderately dilated. There is no left ventricular hypertrophy. Left ventricular diastolic parameters are consistent with Grade I diastolic dysfunction (impaired relaxation). Right Ventricle: The right ventricular size is normal. No increase in right ventricular wall thickness. Right ventricular systolic function is normal. Tricuspid regurgitation signal is inadequate for assessing PA pressure. Left Atrium: Left atrial size was normal in size. Right Atrium: Right atrial size was normal in size. Pericardium: There is no evidence of pericardial effusion. Mitral Valve: The mitral valve is normal in structure. No evidence of mitral valve regurgitation. Tricuspid Valve: The tricuspid valve is normal in structure. Tricuspid valve regurgitation is not demonstrated. Aortic Valve: The aortic valve is tricuspid. Aortic valve regurgitation is not visualized. Pulmonic Valve: The pulmonic valve was normal in structure. Pulmonic valve regurgitation is not visualized. Aorta: There is an aneurysm involving the ascending aorta measuring 42 mm. Venous: The inferior vena cava is normal in size with greater than 50% respiratory variability, suggesting right atrial pressure of 3 mmHg. IAS/Shunts: The interatrial septum was not well visualized.  LEFT VENTRICLE PLAX 2D LVIDd:         6.30 cm      Diastology LVIDs:         5.20 cm      LV e' medial:    6.00 cm/s LV PW:         1.00 cm      LV E/e' medial:  10.3 LV IVS:        1.00 cm      LV e' lateral:   9.68 cm/s LVOT diam:     2.50 cm      LV E/e' lateral: 6.4 LV SV:         114 LV SV Index:   59 LVOT Area:     4.91 cm  LV Volumes (MOD) LV vol d, MOD A2C: 158.0 ml LV vol d, MOD A4C: 247.0 ml LV vol s, MOD A2C: 99.8 ml LV vol s, MOD A4C: 191.0 ml LV SV MOD A2C:     58.2 ml LV SV MOD A4C:     247.0 ml LV SV MOD BP:      58.9 ml RIGHT VENTRICLE RV S prime:     10.90 cm/s TAPSE (  M-mode): 2.4 cm LEFT ATRIUM           Index        RIGHT ATRIUM           Index LA diam:       4.00 cm 2.08 cm/m   RA Area:     17.90 cm LA Vol (A2C): 60.6 ml 31.51 ml/m  RA Volume:   48.30 ml  25.11 ml/m LA Vol (A4C): 57.0 ml 29.64 ml/m  AORTIC VALVE LVOT Vmax:   117.00 cm/s LVOT Vmean:  72.800 cm/s LVOT VTI:    0.232 m  AORTA Ao Root diam: 4.10 cm Ao Asc diam:  4.20 cm MITRAL VALVE MV Area (PHT): 3.31 cm    SHUNTS MV Decel Time: 229 msec    Systemic VTI:  0.23 m MV E velocity: 61.70 cm/s  Systemic Diam: 2.50 cm MV A velocity: 88.30 cm/s MV E/A ratio:  0.70 Photographer signed by Carolan Clines Signature Date/Time: 06/30/2022/11:19:05 AM    Final    MR ABDOMEN MRCP W WO CONTAST  Result Date: 06/30/2022 CLINICAL DATA:  Acute pancreatitis EXAM: MRI ABDOMEN WITHOUT AND WITH CONTRAST (INCLUDING MRCP) TECHNIQUE: Multiplanar multisequence MR imaging of the abdomen was performed both before and after the administration of intravenous contrast. Heavily T2-weighted images of the biliary and pancreatic ducts were obtained, and three-dimensional MRCP images were rendered by post processing. CONTRAST:  56mL GADAVIST GADOBUTROL 1 MMOL/ML IV SOLN COMPARISON:  CT abdomen/pelvis dated 06/29/2022 FINDINGS: Motion degraded images. Lower chest: Lung bases are clear. Hepatobiliary: Liver is notable for irregular central cysts which compress the biliary confluence, measuring up to 3.6 cm on the left (series 15/image 10) and 2.9 cm on the right (series 15/image 13), without enhancement or solid component. No delayed peribiliary enhancement to suggest cholangiocarcinoma on imaging. No hepatic steatosis. Distended gallbladder. Mild layering gallbladder sludge (series 7/image 50). No cholelithiasis. Associated mild intrahepatic duct dilatation, favored to be due to mass effect from the central cysts (described above). No extrahepatic ductal dilatation. Common duct measures 5 mm. No choledocholithiasis is seen. Pancreas: Mild peripancreatic fluid along the pancreatic uncinate process and pancreaticoduodenal  groove (series 3/images 25-26), suggesting groove pancreatitis. No drainable fluid collection/walled-off necrosis. No ductal dilatation. Spleen:  Within normal limits. Adrenals/Urinary Tract:  Adrenal glands are within normal limits. Subcentimeter interpolar left renal cyst (series 3/image 21). Right kidney is within normal limits. No hydronephrosis. Stomach/Bowel: Stomach is within normal limits. Visualized bowel is unremarkable. Vascular/Lymphatic:  No evidence of abdominal aortic aneurysm. No suspicious abdominal lymphadenopathy. Other:  Insert ascites Musculoskeletal: No focal osseous lesions. 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 No drainable fluid collection/walled-off necrosis. Mild intrahepatic ductal dilatation, favored to be due to mass effect from irregular central cysts compressing the biliary confluence. No findings to suggest cholangiocarcinoma on MR. No intrahepatic ductal dilatation. Common duct measures 5 mm. No choledocholithiasis is seen. Distended gallbladder with mild layering gallbladder sludge. No cholelithiasis or inflammatory changes. Electronically Signed   By: Charline Bills M.D.   On: 06/30/2022 00:40   CT Abdomen Pelvis W Contrast  Result Date: 06/29/2022 CLINICAL DATA:  Nausea, vomiting, right lower quadrant abdominal pain, mid abdominal pain, and weakness for 2 days. EXAM: CT ABDOMEN AND PELVIS WITH CONTRAST TECHNIQUE: Multidetector CT imaging of the abdomen and pelvis was performed using the standard protocol following bolus administration of intravenous contrast. RADIATION DOSE REDUCTION: This exam was performed according to the departmental dose-optimization program which includes automated exposure control, adjustment of the mA and/or  kV according to patient size and/or use of iterative reconstruction technique. CONTRAST:  45mL OMNIPAQUE IOHEXOL 300 MG/ML  SOLN COMPARISON:  Abdominal radiographs 06/15/2018 FINDINGS: Lower chest: Atelectasis in the lung bases.  Moderate-sized esophageal hiatal hernia. Hepatobiliary: The gallbladder is distended. No discrete stones or wall thickening identified. Mild extra and moderate to severe intrahepatic bile duct dilatation. Cystic dilatation of the central bile ducts in the right and left side, largest on the left measuring 4.2 cm diameter. No common duct stones are visualized. Findings could represent biliary obstruction, choledochocele, or possibly cystadenoma. Suggest follow-up with elective MRI for further evaluation. Pancreas: No pancreatic mass or infiltration. No pancreatic ductal dilatation. Spleen: Normal in size without focal abnormality. Adrenals/Urinary Tract: Adrenal glands are unremarkable. Kidneys are normal, without renal calculi, focal lesion, or hydronephrosis. Bladder is unremarkable. Stomach/Bowel: Stomach, small bowel, and colon are not abnormally distended. No wall thickening or inflammatory changes are appreciated. Appendix is normal. Vascular/Lymphatic: Aortic atherosclerosis. No enlarged abdominal or pelvic lymph nodes. Reproductive: Marked enlargement of the prostate gland, measuring 9.2 cm diameter. Other: No free air or free fluid in the abdomen. Minimal periumbilical hernia containing fat. Musculoskeletal: Degenerative changes.  No acute bony abnormalities. IMPRESSION: 1. Prominent intrahepatic bile duct dilatation with less pronounced extrahepatic bile duct dilatation, gallbladder distention, and central hepatic cystic changes. Differential diagnosis includes biliary obstruction, possibly cholangiocarcinoma or strictures, choledochocele, or possibly cystadenoma. Suggest follow-up with nonemergent MRI/MRCP for further evaluation. 2. No pancreatic mass or ductal dilatation identified. 3. Marked enlargement of the prostate gland measuring 9.2 cm in diameter. 4. Aortic atherosclerosis. 5. Moderate-sized esophageal hiatal hernia. Electronically Signed   By: Lucienne Capers M.D.   On: 06/29/2022 17:22     Scheduled Meds:  carvedilol  12.5 mg Oral BID WC   insulin aspart  0-9 Units Subcutaneous TID WC   losartan  25 mg Oral Daily   rosuvastatin  20 mg Oral Daily   sodium chloride flush  3 mL Intravenous Q12H   Warfarin - Pharmacist Dosing Inpatient   Does not apply q1600   Continuous Infusions:  cefTRIAXone (ROCEPHIN)  IV     potassium chloride 10 mEq (06/30/22 1028)     LOS: 0 days   Shelly Coss, MD [facility] P7/28/2023, 11:53 AM

## 2022-07-01 DIAGNOSIS — R791 Abnormal coagulation profile: Secondary | ICD-10-CM

## 2022-07-01 DIAGNOSIS — K838 Other specified diseases of biliary tract: Secondary | ICD-10-CM | POA: Diagnosis not present

## 2022-07-01 DIAGNOSIS — K859 Acute pancreatitis without necrosis or infection, unspecified: Secondary | ICD-10-CM | POA: Diagnosis not present

## 2022-07-01 DIAGNOSIS — K831 Obstruction of bile duct: Secondary | ICD-10-CM | POA: Diagnosis not present

## 2022-07-01 LAB — COMPREHENSIVE METABOLIC PANEL
ALT: 217 U/L — ABNORMAL HIGH (ref 0–44)
ALT: 253 U/L — ABNORMAL HIGH (ref 0–44)
AST: 278 U/L — ABNORMAL HIGH (ref 15–41)
AST: 318 U/L — ABNORMAL HIGH (ref 15–41)
Albumin: 2.6 g/dL — ABNORMAL LOW (ref 3.5–5.0)
Albumin: 2.9 g/dL — ABNORMAL LOW (ref 3.5–5.0)
Alkaline Phosphatase: 238 U/L — ABNORMAL HIGH (ref 38–126)
Alkaline Phosphatase: 298 U/L — ABNORMAL HIGH (ref 38–126)
Anion gap: 7 (ref 5–15)
Anion gap: 6 (ref 5–15)
BUN: 18 mg/dL (ref 8–23)
BUN: 17 mg/dL (ref 8–23)
CO2: 21 mmol/L — ABNORMAL LOW (ref 22–32)
CO2: 23 mmol/L (ref 22–32)
Calcium: 8.7 mg/dL — ABNORMAL LOW (ref 8.9–10.3)
Calcium: 8.9 mg/dL (ref 8.9–10.3)
Chloride: 111 mmol/L (ref 98–111)
Chloride: 108 mmol/L (ref 98–111)
Creatinine, Ser: 1.29 mg/dL — ABNORMAL HIGH (ref 0.61–1.24)
Creatinine, Ser: 1.48 mg/dL — ABNORMAL HIGH (ref 0.61–1.24)
GFR, Estimated: >60 mL/min (ref >60)
GFR, Estimated: 52 mL/min — ABNORMAL LOW (ref >60)
Glucose, Bld: 109 mg/dL — ABNORMAL HIGH (ref 70–99)
Glucose, Bld: 135 mg/dL — ABNORMAL HIGH (ref 70–99)
Potassium: 4.6 mmol/L (ref 3.5–5.1)
Potassium: 4.5 mmol/L (ref 3.5–5.1)
Sodium: 139 mmol/L (ref 135–145)
Sodium: 137 mmol/L (ref 135–145)
Total Bilirubin: 1.1 mg/dL (ref 0.3–1.2)
Total Bilirubin: 1.2 mg/dL (ref 0.3–1.2)
Total Protein: 6.4 g/dL — ABNORMAL LOW (ref 6.5–8.1)
Total Protein: 7.1 g/dL (ref 6.5–8.1)

## 2022-07-01 LAB — URINE CULTURE: Culture: >=100000 — AB

## 2022-07-01 LAB — CBC
HCT: 35.4 % — ABNORMAL LOW (ref 39.0–52.0)
Hemoglobin: 11.1 g/dL — ABNORMAL LOW (ref 13.0–17.0)
MCH: 24.8 pg — ABNORMAL LOW (ref 26.0–34.0)
MCHC: 31.4 g/dL (ref 30.0–36.0)
MCV: 79 fL — ABNORMAL LOW (ref 80.0–100.0)
Platelets: 215 10*3/uL (ref 150–400)
RBC: 4.48 MIL/uL (ref 4.22–5.81)
RDW: 16.1 % — ABNORMAL HIGH (ref 11.5–15.5)
WBC: 7.5 10*3/uL (ref 4.0–10.5)
nRBC: 0 % (ref 0.0–0.2)

## 2022-07-01 LAB — LIPASE, BLOOD: Lipase: 43 U/L (ref 11–51)

## 2022-07-01 LAB — GLUCOSE, CAPILLARY
Glucose-Capillary: 107 mg/dL — ABNORMAL HIGH (ref 70–99)
Glucose-Capillary: 135 mg/dL — ABNORMAL HIGH (ref 70–99)
Glucose-Capillary: 136 mg/dL — ABNORMAL HIGH (ref 70–99)
Glucose-Capillary: 110 mg/dL — ABNORMAL HIGH (ref 70–99)

## 2022-07-01 LAB — PROTIME-INR
INR: 4.6 (ref 0.8–1.2)
Prothrombin Time: 43.2 seconds — ABNORMAL HIGH (ref 11.4–15.2)

## 2022-07-01 MED ORDER — CEFDINIR 300 MG PO CAPS
300.0000 mg | ORAL_CAPSULE | Freq: Two times a day (BID) | ORAL | Status: DC
Start: 2022-07-01 — End: 2022-07-04
  Administered 2022-07-01 – 2022-07-04 (×7): 300 mg via ORAL
  Filled 2022-07-01 (×7): qty 1

## 2022-07-01 NOTE — Progress Notes (Addendum)
ANTICOAGULATION CONSULT NOTE  Pharmacy Consult for warfarin Indication: hx stroke  Allergies  Allergen Reactions   Lipitor [Atorvastatin] Nausea And Vomiting    Patient Measurements: Height: 6\' 2"  (188 cm) Weight: 78.1 kg (172 lb 2.9 oz) IBW/kg (Calculated) : 82.2 Heparin Dosing Weight:   Vital Signs: Temp: 98.5 F (36.9 C) (07/29 0402) Temp Source: Oral (07/29 0402) BP: 106/74 (07/29 0840) Pulse Rate: 80 (07/29 0840)  Labs: Recent Labs    06/29/22 1520 06/29/22 2130 06/29/22 2318 06/30/22 0510 06/30/22 0537 07/01/22 0546  HGB 13.2  --   --   --  11.6* 11.1*  HCT 42.7  --   --   --  37.7* 35.4*  PLT 229  --   --   --  216 215  LABPROT 23.1*  --   --   --  31.7* 43.2*  INR 2.1*  --   --   --  3.1* 4.6*  CREATININE 1.72*  --   --  1.18  --  1.29*  TROPONINIHS  --  41* 50* 38*  --   --      Estimated Creatinine Clearance: 61.4 mL/min (A) (by C-G formula based on SCr of 1.29 mg/dL (H)).   Medications:  - PTA warfarin regimen: 5mg  daily except 7.5 mg on Mon and Fri (last dose taken at 8a on 06/29/22 PTA)  Assessment: Patient is a 66 y.o M with hx CVA on warfarin PTA who presented to the ED on 06/29/22 with c/o abdominal pain, n/v and generalized weakness.  Pharmacy has been consulted to resume warfarin.  Today, 07/01/2022: - INR continues to increase up to 4.6 today (was 3.1 yesterday, warfarin dose held) - Hgb slightly decreased but overall stable. Platelets stable. - Documented eating 100% of meals - DDI: use of broad-spectrum antibiotics can lead to an increase in INR - No signs/symptoms of bleeding per RN  Goal of Therapy:  INR 2-3 Monitor platelets by anticoagulation protocol: Yes   Plan:  - Hold warfarin dose due to increase in INR today  - Daily INR, CBC, s/sx bleeding   07/01/22, PharmD, BCPS 07/01/2022 9:23 AM

## 2022-07-01 NOTE — Progress Notes (Signed)
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  On presentation, he was hemodynamically stable.  Lab work showed creatinine of 1.7.  Mildly elevated LFTs.  Lipase was elevated in the range of 3000's.  UA was suspicious for UTI.  CT abdomen/pelvis showed prominent intrahepatic bile duct dilatation.  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 No drainable fluid collection/walled-off necrosis,mild intrahepatic ductal dilatation,Normal CBD, distended gallbladder without signs of inflammation.Patient was admitted for the management of idiopathic pancreatitis.  GI consulted.  Assessment & Plan:  Principal Problem:   Biliary obstruction Active Problems:   Essential hypertension   Chronic systolic heart failure (HCC)   Diabetes (HCC)   Dyslipidemia   BPH without urinary obstruction   Coronary artery disease   History of CVA (cerebrovascular accident)   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   Spastic hemiparesis (HCC)   Pancreatitis   Suspected UTI   Acute pancreatitis   Pancreatitis/elevated lipase: Unclear etiology.  Patient does not consume alcohol.  Triglyceride level normal.  No contributing new medication that could cause pancreatitis.  When he presented he complained of lower abdominal pain instead of epigastric pain. Lipase level has normalised.  But liver enzymes went up.  He denies any abdominal pain today.  Diet advanced to low-fat diet.  Dilated biliary ducts: Imaging as above.  GI consulted.   Bilirubin almost normal.  Normal CBD.  Distended gallbladder with sludge without signs of  inflammation.  We recommend to follow-up with hepatobiliary surgery as an outpatient  Severe systolic congestive/diastolic heart failure: Follows with cardiology.  Echo done here showed EF of 20 to 25%.  Currently appears euvolemic. Monitor on telemetry.  He had 15 runs of V. tach on 7/28  History of coronary artery disease/elevated troponin: Elevated troponin most likely from demand ischemia from severe CHF.  He does not have any chest pain.  Troponins are flat trend He is on carvedilol, losartan, statin at home  Suspicion for UTI: Started on ceftriaxone.  UA showed some bacteria, leukocytes.  He was complaining of lower abdominal discomfort/burning while passing urine.  Urine culture showed E. coli.  Antibiotics changed to oral  History of CVA: On warfarin.  Has residual mild left-sided weakness.  Ambulates with the help of cane.  On statin at home.  Currently supratherapeutic  History of paroxysmal A-fib: Currently in normal sinus rhythm.  On warfarin for anticoagulation.  Monitor on telemetry  Hypertension: On  carvedilol, losartan at home, continue low-dose carvedilol here due to soft blood pressure.  Losartan on hold.  Diabetes type 2: On sliding scale insulin.  Monitor blood sugars  Hyperlipidemia: On Crestor.  Can be held now because of  elevated liver enzymes.  Hypokalemia/hypomagnesemia: Being supplemented and monitored         DVT prophylaxis:  Warfarin     Code Status: Full Code  Family Communication:: Discussed with daughter on phone on 7/28  Patient status: Inpatient  Patient is from :Home  Anticipated discharge [id number]  Estimated DC date:1-2 days   Consultants: GI  Procedures:None  Antimicrobials:  Anti-infectives (From admission, onward)    Start     Dose/Rate Route Frequency Ordered Stop  07/01/22 1045  cefdinir (OMNICEF) capsule 300 mg        300 mg Oral Every 12 hours 07/01/22 0957 07/06/22 0959   06/30/22 2000  cefTRIAXone (ROCEPHIN) 1 g in  sodium chloride 0.9 % 100 mL IVPB  Status:  Discontinued        1 g 200 mL/hr over 30 Minutes Intravenous Every 24 hours 06/29/22 2119 07/01/22 0957   06/29/22 1930  cefTRIAXone (ROCEPHIN) 1 g in sodium chloride 0.9 % 100 mL IVPB        1 g 200 mL/hr over 30 Minutes Intravenous  Once 06/29/22 1915 06/29/22 2112       Subjective: Patient seen and examined at the bedside this morning.  Hemodynamically stable.  Comfortable lying in bed, denies any abdominal pain..    Objective: Vitals:   06/30/22 2349 07/01/22 0402 07/01/22 0500 07/01/22 0840  BP: 94/67 107/60  106/74  Pulse: 65 69  80  Resp: 16 16    Temp: 98.5 F (36.9 C) 98.5 F (36.9 C)    TempSrc: Oral Oral    SpO2: 100% 99%    Weight:   78.1 kg   Height:        Intake/Output Summary (Last 24 hours) at 07/01/2022 1125 Last data filed at 07/01/2022 0916 Gross per 24 hour  Intake 1098 ml  Output 1560 ml  Net -462 ml   Filed Weights   06/29/22 1455 07/01/22 0500  Weight: 68 kg 78.1 kg    Examination:  General exam: Overall comfortable, not in distress, pleasant gentleman HEENT: PERRL Respiratory system:  no wheezes or crackles  Cardiovascular system: S1 & S2 heard, RRR.  Gastrointestinal system: Abdomen is nondistended, soft and nontender. Central nervous system: Alert and oriented, mild residual weakness on the left side Extremities: No edema, no clubbing ,no cyanosis Skin: No rashes, no ulcers,no icterus     Data Reviewed: I have personally reviewed following labs and imaging studies  CBC: Recent Labs  Lab 06/29/22 1520 06/30/22 0537 07/01/22 0546  WBC 9.0 11.7* 7.5  HGB 13.2 11.6* 11.1*  HCT 42.7 37.7* 35.4*  MCV 80.1 80.0 79.0*  PLT 229 216 215   Basic Metabolic Panel: Recent Labs  Lab 06/29/22 1520 06/30/22 0510 07/01/22 0546  NA 140 140 139  K 4.4 3.3* 4.6  CL 109 117* 111  CO2 20* 16* 21*  GLUCOSE 173* 90 109*  BUN 25* 22 18  CREATININE 1.72* 1.18 1.29*  CALCIUM 9.7 6.5* 8.7*  MG  --   1.6*  --      Recent Results (from the past 240 hour(s))  Urine Culture     Status: Abnormal   Collection Time: 06/29/22  7:16 PM   Specimen: Urine, Clean Catch  Result Value Ref Range Status   Specimen Description   Final    URINE, CLEAN CATCH Performed at [facility], [address] [place], [place] [address]    Special Requests   Final    NONE Performed at [facility], [address] [place], [place] [address]    Culture >=100,000 COLONIES/mL ESCHERICHIA COLI (A)  Final   Report Status 07/01/2022 FINAL  Final   Organism ID, Bacteria ESCHERICHIA COLI (A)  Final      Susceptibility   Escherichia coli - MIC*    AMPICILLIN <=2 SENSITIVE Sensitive     CEFAZOLIN <=4 SENSITIVE Sensitive     CEFEPIME <=0.12 SENSITIVE Sensitive     CEFTRIAXONE <=0.25 SENSITIVE Sensitive  CIPROFLOXACIN <=0.25 SENSITIVE Sensitive     GENTAMICIN <=1 SENSITIVE Sensitive     IMIPENEM <=0.25 SENSITIVE Sensitive     NITROFURANTOIN <=16 SENSITIVE Sensitive     TRIMETH/SULFA <=20 SENSITIVE Sensitive     AMPICILLIN/SULBACTAM <=2 SENSITIVE Sensitive     PIP/TAZO <=4 SENSITIVE Sensitive     * >=100,000 COLONIES/mL ESCHERICHIA COLI     Radiology Studies: US Abdomen Limited RUQ (LIVER/GB)  Result Date: 06/30/2022 CLINICAL DATA:  Pancreatitis. EXAM: ULTRASOUND ABDOMEN LIMITED RIGHT UPPER QUADRANT COMPARISON:  CT 06/29/2022, MRI 06/30/2022 FINDINGS: Gallbladder: Gallbladder is mildly distended to 5 cm. Gallbladder wall thickening to 3 mm. Dependent sludge within the lumen the gallbladder. No shadowing gallstones are present. Negative sonographic Murphy's sign. Common bile duct: Diameter: Normal diameter scratch the upper limits of normal at 6 mm. Liver: Anechoic cysts within the liver parenchyma. No biliary duct dilatation. Portal vein is patent on color Doppler imaging with normal direction of blood flow towards the liver. Other: None. IMPRESSION: 1. No echogenic  gallstones.  Sludge within the gallbladder. 2. Mild gallbladder distension.  No pericholecystic fluid. 3. Negative sonographic Murphy's sign. 4. Common bile duct normal diameter. Electronically Signed   By: Suzy Bouchard M.D.   On: 06/30/2022 14:41   ECHOCARDIOGRAM COMPLETE  Result Date: 06/30/2022    ECHOCARDIOGRAM REPORT   Patient Name:   OSHEA LAMORE Date of Exam: 06/30/2022 Medical Rec #:  [id number]     Height:       74.0 in Accession #:    [id number]    Weight:       150.0 lb Date of Birth:  1956/01/15    BSA:          1.923 m Patient Age:    1 years      BP:           106/58 mmHg Patient Gender: M             HR:           68 bpm. Exam Location:  Inpatient Procedure: 2D Echo, Cardiac Doppler, Color Doppler and Intracardiac            Opacification Agent Indications:    Congestive Heart Failure I50.9  History:        Patient has prior history of Echocardiogram examinations, most                 recent 06/15/2018. CAD, TIA and Stroke, Signs/Symptoms:Dyspnea;                 Risk Factors:Diabetes and Hypertension.  Sonographer:    Bernadene Person RDCS Referring Phys: [id number] [place]  1. No definitive LV thrombus identified. Left ventricular ejection fraction, by estimation, is 20 to 25%. The left ventricle has severely decreased function. The left ventricle demonstrates global hypokinesis. The left ventricular internal cavity size was moderately dilated. Left ventricular diastolic parameters are consistent with Grade I diastolic dysfunction (impaired relaxation).  2. Right ventricular systolic function is normal. The right ventricular size is normal. Tricuspid regurgitation signal is inadequate for assessing PA pressure.  3. The mitral valve is normal in structure. No evidence of mitral valve regurgitation.  4. The aortic valve is tricuspid. Aortic valve regurgitation is not visualized.  5. Aneurysm of the ascending aorta, measuring 42 mm.  6. The inferior vena cava is normal in size  with greater than 50% respiratory variability, suggesting right atrial pressure of 3 mmHg. Comparison(s): No significant change from prior  study. FINDINGS  Left Ventricle: No definitive LV thrombus identified. Left ventricular ejection fraction, by estimation, is 20 to 25%. The left ventricle has severely decreased function. The left ventricle demonstrates global hypokinesis. Definity contrast agent was given IV to delineate the left ventricular endocardial borders. The left ventricular internal cavity size was moderately dilated. There is no left ventricular hypertrophy. Left ventricular diastolic parameters are consistent with Grade I diastolic dysfunction (impaired relaxation). Right Ventricle: The right ventricular size is normal. No increase in right ventricular wall thickness. Right ventricular systolic function is normal. Tricuspid regurgitation signal is inadequate for assessing PA pressure. Left Atrium: Left atrial size was normal in size. Right Atrium: Right atrial size was normal in size. Pericardium: There is no evidence of pericardial effusion. Mitral Valve: The mitral valve is normal in structure. No evidence of mitral valve regurgitation. Tricuspid Valve: The tricuspid valve is normal in structure. Tricuspid valve regurgitation is not demonstrated. Aortic Valve: The aortic valve is tricuspid. Aortic valve regurgitation is not visualized. Pulmonic Valve: The pulmonic valve was normal in structure. Pulmonic valve regurgitation is not visualized. Aorta: There is an aneurysm involving the ascending aorta measuring 42 mm. Venous: The inferior vena cava is normal in size with greater than 50% respiratory variability, suggesting right atrial pressure of 3 mmHg. IAS/Shunts: The interatrial septum was not well visualized.  LEFT VENTRICLE PLAX 2D LVIDd:         6.30 cm      Diastology LVIDs:         5.20 cm      LV e' medial:    6.00 cm/s LV PW:         1.00 cm      LV E/e' medial:  10.3 LV IVS:        1.00 cm       LV e' lateral:   9.68 cm/s LVOT diam:     2.50 cm      LV E/e' lateral: 6.4 LV SV:         114 LV SV Index:   59 LVOT Area:     4.91 cm  LV Volumes (MOD) LV vol d, MOD A2C: 158.0 ml LV vol d, MOD A4C: 247.0 ml LV vol s, MOD A2C: 99.8 ml LV vol s, MOD A4C: 191.0 ml LV SV MOD A2C:     58.2 ml LV SV MOD A4C:     247.0 ml LV SV MOD BP:      58.9 ml RIGHT VENTRICLE RV S prime:     10.90 cm/s TAPSE (M-mode): 2.4 cm LEFT ATRIUM           Index        RIGHT ATRIUM           Index LA diam:      4.00 cm 2.08 cm/m   RA Area:     17.90 cm LA Vol (A2C): 60.6 ml 31.51 ml/m  RA Volume:   48.30 ml  25.11 ml/m LA Vol (A4C): 57.0 ml 29.64 ml/m  AORTIC VALVE LVOT Vmax:   117.00 cm/s LVOT Vmean:  72.800 cm/s LVOT VTI:    0.232 m  AORTA Ao Root diam: 4.10 cm Ao Asc diam:  4.20 cm MITRAL VALVE MV Area (PHT): 3.31 cm    SHUNTS MV Decel Time: 229 msec    Systemic VTI:  0.23 m MV E velocity: 61.70 cm/s  Systemic Diam: 2.50 cm MV A velocity: 88.30 cm/s MV E/A ratio:  0.70 [employer]  Electronically signed by Phineas Inches Signature Date/Time: 06/30/2022/11:19:05 AM    Final    MR ABDOMEN MRCP W WO CONTAST  Result Date: 06/30/2022 CLINICAL DATA:  Acute pancreatitis EXAM: MRI ABDOMEN WITHOUT AND WITH CONTRAST (INCLUDING MRCP) TECHNIQUE: Multiplanar multisequence MR imaging of the abdomen was performed both before and after the administration of intravenous contrast. Heavily T2-weighted images of the biliary and pancreatic ducts were obtained, and three-dimensional MRCP images were rendered by post processing. CONTRAST:  8mL GADAVIST GADOBUTROL 1 MMOL/ML IV SOLN COMPARISON:  CT abdomen/pelvis dated 06/29/2022 FINDINGS: Motion degraded images. Lower chest: Lung bases are clear. Hepatobiliary: Liver is notable for irregular central cysts which compress the biliary confluence, measuring up to 3.6 cm on the left (series 15/image 10) and 2.9 cm on the right (series 15/image 13), without enhancement or solid component. No delayed  peribiliary enhancement to suggest cholangiocarcinoma on imaging. No hepatic steatosis. Distended gallbladder. Mild layering gallbladder sludge (series 7/image 50). No cholelithiasis. Associated mild intrahepatic duct dilatation, favored to be due to mass effect from the central cysts (described above). No extrahepatic ductal dilatation. Common duct measures 5 mm. No choledocholithiasis is seen. Pancreas: Mild peripancreatic fluid along the pancreatic uncinate process and pancreaticoduodenal groove (series 3/images 25-26), suggesting groove pancreatitis. No drainable fluid collection/walled-off necrosis. No ductal dilatation. Spleen:  Within normal limits. Adrenals/Urinary Tract:  Adrenal glands are within normal limits. Subcentimeter interpolar left renal cyst (series 3/image 21). Right kidney is within normal limits. No hydronephrosis. Stomach/Bowel: Stomach is within normal limits. Visualized bowel is unremarkable. Vascular/Lymphatic:  No evidence of abdominal aortic aneurysm. No suspicious abdominal lymphadenopathy. Other:  Insert ascites Musculoskeletal: No focal osseous lesions. 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 No drainable fluid collection/walled-off necrosis. Mild intrahepatic ductal dilatation, favored to be due to mass effect from irregular central cysts compressing the biliary confluence. No findings to suggest cholangiocarcinoma on MR. No intrahepatic ductal dilatation. Common duct measures 5 mm. No choledocholithiasis is seen. Distended gallbladder with mild layering gallbladder sludge. No cholelithiasis or inflammatory changes. Electronically Signed   By: Julian Hy M.D.   On: 06/30/2022 00:40   CT Abdomen Pelvis W Contrast  Result Date: 06/29/2022 CLINICAL DATA:  Nausea, vomiting, right lower quadrant abdominal pain, mid abdominal pain, and weakness for 2 days. EXAM: CT ABDOMEN AND PELVIS WITH CONTRAST TECHNIQUE: Multidetector CT imaging of the abdomen and pelvis was performed  using the standard protocol following bolus administration of intravenous contrast. RADIATION DOSE REDUCTION: This exam was performed according to the departmental dose-optimization program which includes automated exposure control, adjustment of the mA and/or kV according to patient size and/or use of iterative reconstruction technique. CONTRAST:  73mL OMNIPAQUE IOHEXOL 300 MG/ML  SOLN COMPARISON:  Abdominal radiographs 06/15/2018 FINDINGS: Lower chest: Atelectasis in the lung bases. Moderate-sized esophageal hiatal hernia. Hepatobiliary: The gallbladder is distended. No discrete stones or wall thickening identified. Mild extra and moderate to severe intrahepatic bile duct dilatation. Cystic dilatation of the central bile ducts in the right and left side, largest on the left measuring 4.2 cm diameter. No common duct stones are visualized. Findings could represent biliary obstruction, choledochocele, or possibly cystadenoma. Suggest follow-up with elective MRI for further evaluation. Pancreas: No pancreatic mass or infiltration. No pancreatic ductal dilatation. Spleen: Normal in size without focal abnormality. Adrenals/Urinary Tract: Adrenal glands are unremarkable. Kidneys are normal, without renal calculi, focal lesion, or hydronephrosis. Bladder is unremarkable. Stomach/Bowel: Stomach, small bowel, and colon are not abnormally distended. No wall thickening or inflammatory changes are appreciated. Appendix is normal. Vascular/Lymphatic:  Aortic atherosclerosis. No enlarged abdominal or pelvic lymph nodes. Reproductive: Marked enlargement of the prostate gland, measuring 9.2 cm diameter. Other: No free air or free fluid in the abdomen. Minimal periumbilical hernia containing fat. Musculoskeletal: Degenerative changes.  No acute bony abnormalities. IMPRESSION: 1. Prominent intrahepatic bile duct dilatation with less pronounced extrahepatic bile duct dilatation, gallbladder distention, and central hepatic cystic  changes. Differential diagnosis includes biliary obstruction, possibly cholangiocarcinoma or strictures, choledochocele, or possibly cystadenoma. Suggest follow-up with nonemergent MRI/MRCP for further evaluation. 2. No pancreatic mass or ductal dilatation identified. 3. Marked enlargement of the prostate gland measuring 9.2 cm in diameter. 4. Aortic atherosclerosis. 5. Moderate-sized esophageal hiatal hernia. Electronically Signed   By: Burman Nieves M.D.   On: 06/29/2022 17:22    Scheduled Meds:  carvedilol  3.125 mg Oral BID WC   cefdinir  300 mg Oral Q12H   insulin aspart  0-9 Units Subcutaneous TID WC   sodium chloride flush  3 mL Intravenous Q12H   Warfarin - Pharmacist Dosing Inpatient   Does not apply q1600   Continuous Infusions:     LOS: 1 day   Burnadette Pop, MD [facility] P7/29/2023, 11:25 AM

## 2022-07-01 NOTE — Progress Notes (Addendum)
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   Recommendations  1.  Acute pancreatitis --unclear etiology, cannot exclude biliary sludge but ultrasound negative for gallstones.  Alcohol not a factor nor hypertriglyceridemia.  Clinically improving.  Lipase improving. --Clinically he looks well without concerning exam --I will advance diet to low-fat/diabetes diet --White count has trended down as has T. bili and lipase; repeat CBC and liver enzymes tomorrow --Supportive care  2.  Choledochocyst/intrahepatic biliary ductal dilatation/elevated liver enzymes --my suspicion for cholangitis is low.  AST and ALT have increased today, however T. bili is declining.  This certainly needs attention as an outpatient but I am not recommending ERCP at this time --Repeat hepatic enzymes tomorrow --He does not need antibiotics for pancreatitis and presentation not felt to be consistent with cholangitis --Outpatient GI follow-up and hepatobiliary surgical opinion  3.  Chronic warfarin with supratherapeutic INR --likely secondary to his acute illness in the setting of chronic therapy.  Warfarin on hold.  Would not reverse with vitamin K at this point given the need for continued anticoagulation therapy --Hold warfarin --Monitor INR --No evidence for bleeding at present  4.  E. coli UTI --per hospitalist medicine, currently on cefdinir  I will check on him tomorrow   Chief Complaint   Patient reports he is feeling well this morning. No further nausea or vomiting No abdominal pain Tolerating liquids, feels hungry and would like to try advancing diet Small nonbloody BM yesterday  Vital signs in last 24 hours: Temp:  [97.7 F (36.5 C)-98.5 F (36.9 C)] 98.5  F (36.9 C) (07/29 0402) Pulse Rate:  [63-82] 80 (07/29 0840) Resp:  [16-18] 16 (07/29 0402) BP: (94-122)/(60-75) 106/74 (07/29 0840) SpO2:  [98 %-100 %] 99 % (07/29 0402) Weight:  [78.1 kg] 78.1 kg (07/29 0500) Last BM Date : 06/29/22 (per pt)  General: Alert, well-developed, in NAD Heart:  Regular rate and rhythm; no murmurs Chest: Clear to ascultation bilaterally Abdomen:  Soft, nontender and nondistended. Normal bowel sounds, without guarding, and without rebound.   Extremities:  Without edema. Neurologic:  Alert and  oriented x4; grossly normal neurologically. Psych:  Alert and cooperative. Normal mood and affect.  Intake/Output from previous day: 07/28 0701 - 07/29 0700 In: 1960 [P.O.:460; I.V.:1000; IV Piggyback:500] Out: 1560 [Urine:1560] Intake/Output this shift: Total I/O In: 338 [P.O.:338] Out: -   Lab Results: Recent Labs    06/29/22 1520 06/30/22 0537 07/01/22 0546  WBC 9.0 11.7* 7.5  HGB 13.2 11.6* 11.1*  HCT 42.7 37.7* 35.4*  PLT 229 216 215   BMET Recent Labs    06/29/22 1520 06/30/22 0510 07/01/22 0546  NA 140 140 139  K 4.4 3.3* 4.6  CL 109 117* 111  CO2 20* 16* 21*  GLUCOSE 173* 90 109*  BUN 25* 22 18  CREATININE 1.72* 1.18 1.29*  CALCIUM 9.7 6.5* 8.7*   LFT Recent Labs    06/29/22 1520 06/30/22 0510 07/01/22 0546  PROT 8.0   < > 6.4*  ALBUMIN 3.6   < > 2.6*  AST 84*   < > 278*  ALT 81*   < > 217*  ALKPHOS 103   < > 238*  BILITOT 1.3*   < > 1.1  BILIDIR 0.8*  --   --   IBILI 0.5  --   --    < > =  values in this interval not displayed.   PT/INR Recent Labs    06/30/22 0537 07/01/22 0546  LABPROT 31.7* 43.2*  INR 3.1* 4.6*   Hepatitis Panel No results for input(s): "HEPBSAG", "HCVAB", "HEPAIGM", "HEPBIGM" in the last 72 hours.  Studies/Results: US Abdomen Limited RUQ (LIVER/GB)  Result Date: 06/30/2022 CLINICAL DATA:  Pancreatitis. EXAM: ULTRASOUND ABDOMEN LIMITED RIGHT UPPER QUADRANT COMPARISON:  CT 06/29/2022, MRI  06/30/2022 FINDINGS: Gallbladder: Gallbladder is mildly distended to 5 cm. Gallbladder wall thickening to 3 mm. Dependent sludge within the lumen the gallbladder. No shadowing gallstones are present. Negative sonographic Murphy's sign. Common bile duct: Diameter: Normal diameter scratch the upper limits of normal at 6 mm. Liver: Anechoic cysts within the liver parenchyma. No biliary duct dilatation. Portal vein is patent on color Doppler imaging with normal direction of blood flow towards the liver. Other: None. IMPRESSION: 1. No echogenic gallstones.  Sludge within the gallbladder. 2. Mild gallbladder distension.  No pericholecystic fluid. 3. Negative sonographic Murphy's sign. 4. Common bile duct normal diameter. Electronically Signed   By: Genevive Bi M.D.   On: 06/30/2022 14:41   ECHOCARDIOGRAM COMPLETE  Result Date: 06/30/2022    ECHOCARDIOGRAM REPORT   Patient Name:   Karl [name] Date of Exam: 06/30/2022 Medical Rec #:  [id number]     Height:       74.0 in Accession #:    [id number]    Weight:       150.0 lb Date of Birth:  1956-01-27    BSA:          1.923 m Patient Age:    66 years      BP:           106/58 mmHg Patient Gender: M             HR:           68 bpm. Exam Location:  Inpatient Procedure: 2D Echo, Cardiac Doppler, Color Doppler and Intracardiac            Opacification Agent Indications:    Congestive Heart Failure I50.9  History:        Patient has prior history of Echocardiogram examinations, most                 recent 06/15/2018. CAD, TIA and Stroke, Signs/Symptoms:Dyspnea;                 Risk Factors:Diabetes and Hypertension.  Sonographer:    Eulah Pont RDCS Referring Phys: [id number] Cecille Po MELVIN IMPRESSIONS  1. No definitive LV thrombus identified. Left ventricular ejection fraction, by estimation, is 20 to 25%. The left ventricle has severely decreased function. The left ventricle demonstrates global hypokinesis. The left ventricular internal cavity size was moderately  dilated. Left ventricular diastolic parameters are consistent with Grade I diastolic dysfunction (impaired relaxation).  2. Right ventricular systolic function is normal. The right ventricular size is normal. Tricuspid regurgitation signal is inadequate for assessing PA pressure.  3. The mitral valve is normal in structure. No evidence of mitral valve regurgitation.  4. The aortic valve is tricuspid. Aortic valve regurgitation is not visualized.  5. Aneurysm of the ascending aorta, measuring 42 mm.  6. The inferior vena cava is normal in size with greater than 50% respiratory variability, suggesting right atrial pressure of 3 mmHg. Comparison(s): No significant change from prior study. FINDINGS  Left Ventricle: No definitive LV thrombus identified. Left ventricular ejection fraction, by estimation, is 20 to 25%. The left  ventricle has severely decreased function. The left ventricle demonstrates global hypokinesis. Definity contrast agent was given IV to delineate the left ventricular endocardial borders. The left ventricular internal cavity size was moderately dilated. There is no left ventricular hypertrophy. Left ventricular diastolic parameters are consistent with Grade I diastolic dysfunction (impaired relaxation). Right Ventricle: The right ventricular size is normal. No increase in right ventricular wall thickness. Right ventricular systolic function is normal. Tricuspid regurgitation signal is inadequate for assessing PA pressure. Left Atrium: Left atrial size was normal in size. Right Atrium: Right atrial size was normal in size. Pericardium: There is no evidence of pericardial effusion. Mitral Valve: The mitral valve is normal in structure. No evidence of mitral valve regurgitation. Tricuspid Valve: The tricuspid valve is normal in structure. Tricuspid valve regurgitation is not demonstrated. Aortic Valve: The aortic valve is tricuspid. Aortic valve regurgitation is not visualized. Pulmonic Valve: The  pulmonic valve was normal in structure. Pulmonic valve regurgitation is not visualized. Aorta: There is an aneurysm involving the ascending aorta measuring 42 mm. Venous: The inferior vena cava is normal in size with greater than 50% respiratory variability, suggesting right atrial pressure of 3 mmHg. IAS/Shunts: The interatrial septum was not well visualized.  LEFT VENTRICLE PLAX 2D LVIDd:         6.30 cm      Diastology LVIDs:         5.20 cm      LV e' medial:    6.00 cm/s LV PW:         1.00 cm      LV E/e' medial:  10.3 LV IVS:        1.00 cm      LV e' lateral:   9.68 cm/s LVOT diam:     2.50 cm      LV E/e' lateral: 6.4 LV SV:         114 LV SV Index:   59 LVOT Area:     4.91 cm  LV Volumes (MOD) LV vol d, MOD A2C: 158.0 ml LV vol d, MOD A4C: 247.0 ml LV vol s, MOD A2C: 99.8 ml LV vol s, MOD A4C: 191.0 ml LV SV MOD A2C:     58.2 ml LV SV MOD A4C:     247.0 ml LV SV MOD BP:      58.9 ml RIGHT VENTRICLE RV S prime:     10.90 cm/s TAPSE (M-mode): 2.4 cm LEFT ATRIUM           Index        RIGHT ATRIUM           Index LA diam:      4.00 cm 2.08 cm/m   RA Area:     17.90 cm LA Vol (A2C): 60.6 ml 31.51 ml/m  RA Volume:   48.30 ml  25.11 ml/m LA Vol (A4C): 57.0 ml 29.64 ml/m  AORTIC VALVE LVOT Vmax:   117.00 cm/s LVOT Vmean:  72.800 cm/s LVOT VTI:    0.232 m  AORTA Ao Root diam: 4.10 cm Ao Asc diam:  4.20 cm MITRAL VALVE MV Area (PHT): 3.31 cm    SHUNTS MV Decel Time: 229 msec    Systemic VTI:  0.23 m MV E velocity: 61.70 cm/s  Systemic Diam: 2.50 cm MV A velocity: 88.30 cm/s MV E/A ratio:  0.70 Mary Scientist, physiological signed by Phineas Inches Signature Date/Time: 06/30/2022/11:19:05 AM    Final    MR ABDOMEN MRCP W WO CONTAST  Result Date: 06/30/2022 CLINICAL DATA:  Acute pancreatitis EXAM: MRI ABDOMEN WITHOUT AND WITH CONTRAST (INCLUDING MRCP) TECHNIQUE: Multiplanar multisequence MR imaging of the abdomen was performed both before and after the administration of intravenous contrast. Heavily T2-weighted  images of the biliary and pancreatic ducts were obtained, and three-dimensional MRCP images were rendered by post processing. CONTRAST:  35mL GADAVIST GADOBUTROL 1 MMOL/ML IV SOLN COMPARISON:  CT abdomen/pelvis dated 06/29/2022 FINDINGS: Motion degraded images. Lower chest: Lung bases are clear. Hepatobiliary: Liver is notable for irregular central cysts which compress the biliary confluence, measuring up to 3.6 cm on the left (series 15/image 10) and 2.9 cm on the right (series 15/image 13), without enhancement or solid component. No delayed peribiliary enhancement to suggest cholangiocarcinoma on imaging. No hepatic steatosis. Distended gallbladder. Mild layering gallbladder sludge (series 7/image 50). No cholelithiasis. Associated mild intrahepatic duct dilatation, favored to be due to mass effect from the central cysts (described above). No extrahepatic ductal dilatation. Common duct measures 5 mm. No choledocholithiasis is seen. Pancreas: Mild peripancreatic fluid along the pancreatic uncinate process and pancreaticoduodenal groove (series 3/images 25-26), suggesting groove pancreatitis. No drainable fluid collection/walled-off necrosis. No ductal dilatation. Spleen:  Within normal limits. Adrenals/Urinary Tract:  Adrenal glands are within normal limits. Subcentimeter interpolar left renal cyst (series 3/image 21). Right kidney is within normal limits. No hydronephrosis. Stomach/Bowel: Stomach is within normal limits. Visualized bowel is unremarkable. Vascular/Lymphatic:  No evidence of abdominal aortic aneurysm. No suspicious abdominal lymphadenopathy. Other:  Insert ascites Musculoskeletal: No focal osseous lesions. 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 No drainable fluid collection/walled-off necrosis. Mild intrahepatic ductal dilatation, favored to be due to mass effect from irregular central cysts compressing the biliary confluence. No findings to suggest cholangiocarcinoma on MR. No intrahepatic  ductal dilatation. Common duct measures 5 mm. No choledocholithiasis is seen. Distended gallbladder with mild layering gallbladder sludge. No cholelithiasis or inflammatory changes. Electronically Signed   By: Julian Hy M.D.   On: 06/30/2022 00:40   CT Abdomen Pelvis W Contrast  Result Date: 06/29/2022 CLINICAL DATA:  Nausea, vomiting, right lower quadrant abdominal pain, mid abdominal pain, and weakness for 2 days. EXAM: CT ABDOMEN AND PELVIS WITH CONTRAST TECHNIQUE: Multidetector CT imaging of the abdomen and pelvis was performed using the standard protocol following bolus administration of intravenous contrast. RADIATION DOSE REDUCTION: This exam was performed according to the departmental dose-optimization program which includes automated exposure control, adjustment of the mA and/or kV according to patient size and/or use of iterative reconstruction technique. CONTRAST:  20mL OMNIPAQUE IOHEXOL 300 MG/ML  SOLN COMPARISON:  Abdominal radiographs 06/15/2018 FINDINGS: Lower chest: Atelectasis in the lung bases. Moderate-sized esophageal hiatal hernia. Hepatobiliary: The gallbladder is distended. No discrete stones or wall thickening identified. Mild extra and moderate to severe intrahepatic bile duct dilatation. Cystic dilatation of the central bile ducts in the right and left side, largest on the left measuring 4.2 cm diameter. No common duct stones are visualized. Findings could represent biliary obstruction, choledochocele, or possibly cystadenoma. Suggest follow-up with elective MRI for further evaluation. Pancreas: No pancreatic mass or infiltration. No pancreatic ductal dilatation. Spleen: Normal in size without focal abnormality. Adrenals/Urinary Tract: Adrenal glands are unremarkable. Kidneys are normal, without renal calculi, focal lesion, or hydronephrosis. Bladder is unremarkable. Stomach/Bowel: Stomach, small bowel, and colon are not abnormally distended. No wall thickening or inflammatory  changes are appreciated. Appendix is normal. Vascular/Lymphatic: Aortic atherosclerosis. No enlarged abdominal or pelvic lymph nodes. Reproductive: Marked enlargement of the prostate gland, measuring 9.2 cm diameter. Other: No  free air or free fluid in the abdomen. Minimal periumbilical hernia containing fat. Musculoskeletal: Degenerative changes.  No acute bony abnormalities. IMPRESSION: 1. Prominent intrahepatic bile duct dilatation with less pronounced extrahepatic bile duct dilatation, gallbladder distention, and central hepatic cystic changes. Differential diagnosis includes biliary obstruction, possibly cholangiocarcinoma or strictures, choledochocele, or possibly cystadenoma. Suggest follow-up with nonemergent MRI/MRCP for further evaluation. 2. No pancreatic mass or ductal dilatation identified. 3. Marked enlargement of the prostate gland measuring 9.2 cm in diameter. 4. Aortic atherosclerosis. 5. Moderate-sized esophageal hiatal hernia. Electronically Signed   By: Burman Nieves M.D.   On: 06/29/2022 17:22      LOS: 1 day   Beverley Fiedler, MD 07/01/2022, 10:46 AM See Loretha Stapler, [facility], to contact our on call provider

## 2022-07-01 NOTE — Progress Notes (Addendum)
Date and time results received: 07/01/22 0846   Test: INR Critical Value: 4.6  Name of Provider Notified: MD Adhikari  Orders Received? Or Actions Taken?: MD paged via Amion. Awaiting orders.

## 2022-07-02 DIAGNOSIS — K859 Acute pancreatitis without necrosis or infection, unspecified: Secondary | ICD-10-CM | POA: Diagnosis not present

## 2022-07-02 DIAGNOSIS — R748 Abnormal levels of other serum enzymes: Secondary | ICD-10-CM | POA: Diagnosis not present

## 2022-07-02 DIAGNOSIS — K838 Other specified diseases of biliary tract: Secondary | ICD-10-CM | POA: Diagnosis not present

## 2022-07-02 DIAGNOSIS — K831 Obstruction of bile duct: Secondary | ICD-10-CM | POA: Diagnosis not present

## 2022-07-02 LAB — CBC
HCT: 38.1 % — ABNORMAL LOW (ref 39.0–52.0)
Hemoglobin: 11.7 g/dL — ABNORMAL LOW (ref 13.0–17.0)
MCH: 24.6 pg — ABNORMAL LOW (ref 26.0–34.0)
MCHC: 30.7 g/dL (ref 30.0–36.0)
MCV: 80 fL (ref 80.0–100.0)
Platelets: 233 10*3/uL (ref 150–400)
RBC: 4.76 MIL/uL (ref 4.22–5.81)
RDW: 16 % — ABNORMAL HIGH (ref 11.5–15.5)
WBC: 7.4 10*3/uL (ref 4.0–10.5)
nRBC: 0 % (ref 0.0–0.2)

## 2022-07-02 LAB — COMPREHENSIVE METABOLIC PANEL
ALT: 326 U/L — ABNORMAL HIGH (ref 0–44)
AST: 322 U/L — ABNORMAL HIGH (ref 15–41)
Albumin: 2.9 g/dL — ABNORMAL LOW (ref 3.5–5.0)
Alkaline Phosphatase: 364 U/L — ABNORMAL HIGH (ref 38–126)
Anion gap: 6 (ref 5–15)
BUN: 16 mg/dL (ref 8–23)
CO2: 24 mmol/L (ref 22–32)
Calcium: 8.8 mg/dL — ABNORMAL LOW (ref 8.9–10.3)
Chloride: 107 mmol/L (ref 98–111)
Creatinine, Ser: 1.45 mg/dL — ABNORMAL HIGH (ref 0.61–1.24)
GFR, Estimated: 53 mL/min — ABNORMAL LOW (ref >60)
Glucose, Bld: 120 mg/dL — ABNORMAL HIGH (ref 70–99)
Potassium: 4.5 mmol/L (ref 3.5–5.1)
Sodium: 137 mmol/L (ref 135–145)
Total Bilirubin: 0.9 mg/dL (ref 0.3–1.2)
Total Protein: 7.1 g/dL (ref 6.5–8.1)

## 2022-07-02 LAB — PROTIME-INR
INR: 2.7 — ABNORMAL HIGH (ref 0.8–1.2)
Prothrombin Time: 28.1 seconds — ABNORMAL HIGH (ref 11.4–15.2)

## 2022-07-02 LAB — GLUCOSE, CAPILLARY
Glucose-Capillary: 113 mg/dL — ABNORMAL HIGH (ref 70–99)
Glucose-Capillary: 146 mg/dL — ABNORMAL HIGH (ref 70–99)
Glucose-Capillary: 135 mg/dL — ABNORMAL HIGH (ref 70–99)
Glucose-Capillary: 123 mg/dL — ABNORMAL HIGH (ref 70–99)

## 2022-07-02 MED ORDER — GLUCERNA SHAKE PO LIQD
237.0000 mL | Freq: Three times a day (TID) | ORAL | Status: DC
  Administered 2022-07-02 – 2022-07-03 (×4): 237 mL via ORAL
  Filled 2022-07-02 (×8): qty 237

## 2022-07-02 MED ORDER — PROSOURCE PLUS PO LIQD
30.0000 mL | Freq: Two times a day (BID) | ORAL | Status: DC
  Administered 2022-07-02 – 2022-07-03 (×3): 30 mL via ORAL
  Filled 2022-07-02 (×2): qty 30

## 2022-07-02 MED ORDER — ADULT MULTIVITAMIN W/MINERALS CH
1.0000 | ORAL_TABLET | Freq: Every day | ORAL | Status: DC
  Administered 2022-07-02 – 2022-07-04 (×3): 1 via ORAL
  Filled 2022-07-02 (×3): qty 1

## 2022-07-02 MED ORDER — B COMPLEX-C PO TABS
1.0000 | ORAL_TABLET | Freq: Every day | ORAL | Status: DC
Start: 2022-07-02 — End: 2022-07-04
  Administered 2022-07-02 – 2022-07-03 (×2): 1 via ORAL
  Filled 2022-07-02 (×2): qty 1

## 2022-07-02 NOTE — Progress Notes (Signed)
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  On presentation, he was hemodynamically stable.  Lab work showed creatinine of 1.7.  Mildly elevated LFTs.  Lipase was elevated in the range of 3000's.  UA was suspicious for UTI.  CT abdomen/pelvis showed prominent intrahepatic bile duct dilatation.  MRCP was done showing  pancreatitis.Patient was admitted for the management of idiopathic pancreatitis.  GI consulted.  Hospital course remarkable for persistent elevation in liver enzymes, planning for HIDA scan today.  Assessment & Plan:  Principal Problem:   Biliary obstruction Active Problems:   Essential hypertension   Chronic systolic heart failure (HCC)   Diabetes (HCC)   Dyslipidemia   BPH without urinary obstruction   Coronary artery disease   History of CVA (cerebrovascular accident)   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   Spastic hemiparesis (HCC)   Pancreatitis   Suspected UTI   Acute pancreatitis   Pancreatitis/elevated lipase: Unclear etiology.  Patient does not consume alcohol.  Triglyceride level normal.  No contributing new medication that could cause pancreatitis.  When he presented he complained of lower abdominal pain instead of epigastric pain. Lipase level has normalised.  But liver enzymes going  up.  He denies any abdominal pain today.  GI planning for HIDA scan.   Diet advanced to low-fat diet.  Dilated biliary ducts: Imaging as above.  GI consulted.   Bilirubin almost normal.  Normal CBD.  Distended gallbladder with sludge without signs of inflammation.  We also  recommend to follow-up with hepatobiliary surgery as an outpatient  Severe systolic congestive/diastolic heart failure: Follows with cardiology.  Echo done here showed EF of 20 to 25%.  Currently appears euvolemic. Monitor on telemetry.  He had 15 runs of V. tach on 7/28  History of coronary artery disease/elevated troponin: Elevated troponin most likely from demand ischemia from severe CHF.  He does not have any chest pain.  Troponins are flat trend He is on carvedilol, losartan, statin at home  Suspicion for UTI: Started on ceftriaxone.  UA showed some bacteria, leukocytes.  He was complaining of lower abdominal discomfort/burning while passing urine.  Urine culture showed E. coli.  Antibiotics changed to oral  History of CVA: On warfarin.  Has residual mild left-sided weakness.  Ambulates with the help of cane.  On statin at home.  Currently supratherapeutic  CKD stage 3a: On review of his previous lab works, his creatinine has fluctuated between 1-2.  Currently within range.  History of paroxysmal A-fib: Currently in normal sinus rhythm.  On warfarin for anticoagulation.  Monitor on telemetry.  Warfarin will be held for possible GI procedure.  Hypertension: On  carvedilol, losartan at home, continue low-dose carvedilol here due to soft blood pressure.  Losartan on hold.  Diabetes type 2: On sliding scale insulin.  Monitor blood sugars  Hyperlipidemia: On Crestor.  Can be held now because of  elevated liver enzymes.  Hypokalemia/hypomagnesemia: Being supplemented and monitored    Nutrition Problem: Increased nutrient needs Etiology: acute illness    DVT prophylaxis:  Warfarin     Code Status: Full Code  Family Communication:: Discussed with daughter on phone  on 7/28  Patient status: Inpatient  Patient is from :Home  Anticipated discharge NE:[id number]  Estimated DC date:after full GI work-up, improvement in the liver enzymes   Consultants:  GI  Procedures:None  Antimicrobials:  Anti-infectives (From admission, onward)    Start     Dose/Rate Route Frequency Ordered Stop   07/01/22 1045  cefdinir (OMNICEF) capsule 300 mg        300 mg Oral Every 12 hours 07/01/22 0957 07/06/22 0959   06/30/22 2000  cefTRIAXone (ROCEPHIN) 1 g in sodium chloride 0.9 % 100 mL IVPB  Status:  Discontinued        1 g 200 mL/hr over 30 Minutes Intravenous Every 24 hours 06/29/22 2119 07/01/22 0957   06/29/22 1930  cefTRIAXone (ROCEPHIN) 1 g in sodium chloride 0.9 % 100 mL IVPB        1 g 200 mL/hr over 30 Minutes Intravenous  Once 06/29/22 1915 06/29/22 2112       Subjective: Patient seen and examined at the bedside this morning.  Hemodynamically stable.  Comfortable.  Sitting in chair without any complaints.  No abdominal pain  Objective: Vitals:   07/01/22 1830 07/01/22 2050 07/02/22 0500 07/02/22 0600  BP: 102/63 110/70  108/70  Pulse: 63 69  (!) 58  Resp:      Temp:    98.1 F (36.7 C)  TempSrc:    Oral  SpO2:  100%  100%  Weight:   76.3 kg   Height:        Intake/Output Summary (Last 24 hours) at 07/02/2022 1132 Last data filed at 07/02/2022 0857 Gross per 24 hour  Intake 560 ml  Output 652 ml  Net -92 ml   Filed Weights   06/29/22 1455 07/01/22 0500 07/02/22 0500  Weight: 68 kg 78.1 kg 76.3 kg    Examination:  General exam: Overall comfortable, not in distress, very pleasant gentleman HEENT: PERRL Respiratory system:  no wheezes or crackles  Cardiovascular system: S1 & S2 heard, RRR.  Gastrointestinal system: Abdomen is nondistended, soft and nontender. Central nervous system: Alert and oriented Extremities: No edema, no clubbing ,no cyanosis Skin: No rashes, no ulcers,no icterus       Data Reviewed: I have personally reviewed following labs and imaging studies  CBC: Recent Labs  Lab 06/29/22 1520 06/30/22 0537 07/01/22 0546 07/02/22 0532  WBC 9.0 11.7* 7.5 7.4  HGB 13.2 11.6* 11.1* 11.7*  HCT 42.7  37.7* 35.4* 38.1*  MCV 80.1 80.0 79.0* 80.0  PLT 229 216 215 0000000   Basic Metabolic Panel: Recent Labs  Lab 06/29/22 1520 06/30/22 0510 07/01/22 0546 07/01/22 1211 07/02/22 0532  NA 140 140 139 137 137  K 4.4 3.3* 4.6 4.5 4.5  CL 109 117* 111 108 107  CO2 20* 16* 21* 23 24  GLUCOSE 173* 90 109* 135* 120*  BUN 25* 22 18 17 16   CREATININE 1.72* 1.18 1.29* 1.48* 1.45*  CALCIUM 9.7 6.5* 8.7* 8.9 8.8*  MG  --  1.6*  --   --   --      Recent Results (from the past 240 hour(s))  Urine Culture     Status: Abnormal   Collection Time: 06/29/22  7:16 PM   Specimen: Urine, Clean Catch  Result Value Ref Range Status   Specimen Description   Final    URINE, CLEAN CATCH Performed at [facility], [place] [address], [place], [place] [address]    Special Requests   Final  NONE Performed at [facility], [address] [place], [place] [address]    Culture >=100,000 COLONIES/mL ESCHERICHIA COLI (A)  Final   Report Status 07/01/2022 FINAL  Final   Organism ID, Bacteria ESCHERICHIA COLI (A)  Final      Susceptibility   Escherichia coli - MIC*    AMPICILLIN <=2 SENSITIVE Sensitive     CEFAZOLIN <=4 SENSITIVE Sensitive     CEFEPIME <=0.12 SENSITIVE Sensitive     CEFTRIAXONE <=0.25 SENSITIVE Sensitive     CIPROFLOXACIN <=0.25 SENSITIVE Sensitive     GENTAMICIN <=1 SENSITIVE Sensitive     IMIPENEM <=0.25 SENSITIVE Sensitive     NITROFURANTOIN <=16 SENSITIVE Sensitive     TRIMETH/SULFA <=20 SENSITIVE Sensitive     AMPICILLIN/SULBACTAM <=2 SENSITIVE Sensitive     PIP/TAZO <=4 SENSITIVE Sensitive     * >=100,000 COLONIES/mL ESCHERICHIA COLI     Radiology Studies: US Abdomen Limited RUQ (LIVER/GB)  Result Date: 06/30/2022 CLINICAL DATA:  Pancreatitis. EXAM: ULTRASOUND ABDOMEN LIMITED RIGHT UPPER QUADRANT COMPARISON:  CT 06/29/2022, MRI 06/30/2022 FINDINGS: Gallbladder: Gallbladder is mildly distended to 5 cm. Gallbladder wall thickening to 3 mm.  Dependent sludge within the lumen the gallbladder. No shadowing gallstones are present. Negative sonographic Murphy's sign. Common bile duct: Diameter: Normal diameter scratch the upper limits of normal at 6 mm. Liver: Anechoic cysts within the liver parenchyma. No biliary duct dilatation. Portal vein is patent on color Doppler imaging with normal direction of blood flow towards the liver. Other: None. IMPRESSION: 1. No echogenic gallstones.  Sludge within the gallbladder. 2. Mild gallbladder distension.  No pericholecystic fluid. 3. Negative sonographic Murphy's sign. 4. Common bile duct normal diameter. Electronically Signed   By: Genevive Bi M.D.   On: 06/30/2022 14:41    Scheduled Meds:  (feeding supplement) PROSource Plus  30 mL Oral BID BM   B-complex with vitamin C  1 tablet Oral Daily   carvedilol  3.125 mg Oral BID WC   cefdinir  300 mg Oral Q12H   feeding supplement (GLUCERNA SHAKE)  237 mL Oral TID BM   insulin aspart  0-9 Units Subcutaneous TID WC   multivitamin with minerals  1 tablet Oral Daily   sodium chloride flush  3 mL Intravenous Q12H   Warfarin - Pharmacist Dosing Inpatient   Does not apply q1600   Continuous Infusions:     LOS: 2 days   Burnadette Pop, MD [facility] P7/30/2023, 11:32 AM

## 2022-07-02 NOTE — Discharge Instructions (Signed)
Pancreatitis Nutrition Therapy  The pancreas helps your body digest and absorb nutrients in food. Pancreatitis prevents the body from digesting food well, especially if the food is high in fat.  This nutrition therapy limits the fat in your diet while providing nutrients you need. Your goal is to eat as near to a normal diet as possible without experiencing gastrointestinal (GI) symptoms. These symptoms include stomach pain, bloating, weight loss or difficulty maintaining weight, vomiting, burping, loose stools, and steatorrhea. The effects of pancreatitis are different for all individuals, so it is important to work with your registered dietitian nutritionist (RDN) to determine which foods trigger your GI symptoms. Your RDN can also help you figure out your tolerance to fat in foods and how to manage your symptoms with your diet.  Tips You may need to take pancreatic enzymes if you have frequent, loose stools after mealtimes. Individuals who take pancreatic enzymes will need to take the prescribed dosage at the start of a meal or snack. Individuals who are prescribed a low-fat diet will need to limit fats and oils to no more than 6 teaspoons (30 grams) daily. Up to 1 ounce of avocado can also be substituted for 1 teaspoon of fat. A low-fat diet may not be needed if you are taking pancreatic enzymes. Avoid drinking alcohol. Keep a bottle of water with you at all times to stay hydrated and ensure you are getting in enough fluid each day. The general recommendation is to drink 8 cups (64 ounces) of fluids daily. Eat small, frequent meals (4-6) throughout the day to help you recover from pancreatitis or to maintain your normal body weight. Eat more whole fruits and vegetables rather than drinking fruit and vegetable juices.  Fiber is found in whole grain foods and slows digestion. You may need to choose whole grain foods less often if you feel full quickly after eating. Ask your RDN for recommendations on  managing your diet if you also have other conditions, such as diabetes mellitus. Your RDN might recommend a vitamin and mineral supplement or fat-soluble vitamin supplements if you require higher amounts of these nutrients.  Foods Recommended and Foods Not Recommended The following list of foods may be helpful if you need to limit your fat intake. Food Group Foods Recommended Foods Not Recommended  Grains Breads: Bagels, buns, English muffins Hot/cold cereals Couscous Low-fat crackers Pancakes Pasta Popcorn, air popped Rice Corn or flour tortilla Products made with added fat (such as biscuits, waffles, and regular crackers) High-fat bakery products such as doughnuts, biscuits, croissants, Danish pastries, pies, cookies Snacks made with partially hydrogenated oils including chips, cheese puffs, snack mixes, regular crackers, butter-flavored popcorn  Protein Foods Lean cuts of poultry (without skin) such as chicken or turkey Low-fat hamburger (for example, 7% fat) Lean cuts of fish (white fish) Canned tuna in water Egg whites or egg substitute Lean deli meats such as turkey, chicken, lean beef Non-animal protein sources (tofu, legumes, beans, lentils) Smooth nut butters     Higher-fat cuts of meats such as ribs, T-bone steak, regular hamburger (15% to 20% fat) Full-fat processed meats (hot dogs, bologna, salami, sausage, bacon, etc) Red meats Organ meats (liver, brains, sweetbreads) Poultry with skin Fried meat, poultry, tofu, and fish Whole eggs and egg yolks Full-fat refried beans Tree nuts and peanuts  Dairy and Dairy Alternatives 1% or fat-free dairy (milk, yogurt, cheese, cottage cheese, sour cream) Frozen yogurt Fortified non-dairy milk (almond, rice, soy, etc.) Creamy/cheesy sauces Cream Whole-fat or reduced-fat (2%) dairy (  milk, yogurt, ice cream, cheese) Milkshakes Half-and-half Cream cheese Sour cream Coconut milk  Vegetables All fresh, frozen, or canned  vegetables Fried or stir-fried vegetables Vegetables prepared with butter, cheese, or cream sauce  Fruit All fresh, frozen, or canned fruit Fried fruits Fruit served with butter or cream  Fats and Oils All vegetable oils   Butter, stick margarine, shortening, partially hydrogenated oils, tropical oils (coconut, palm, and palm kernel oils)   Pancreatitis Sample 1-Day Menu View Nutrient Info Breakfast 2 egg whites, cooked 1 whole wheat bagel 1 tablespoon low-fat cream cheese 1 cup fat-free milk  cup blueberries  Lunch 2 slices bread 2 ounces turkey breast 2 leaves lettuce 2 slices tomato 1 teaspoon mustard 2 teaspoons nonfat mayonnaise 1 cup carrots  cup pineapple 1 cup fat-free milk  Evening Meal 3 ounces tilapia, baked  cup cooked rice  cup zucchini 1 cup lettuce for salad 2 teaspoons fat-free Italian dressing, for salad 1 dinner roll 1 teaspoon margarine  Evening Snack  cup low-fat yogurt 1 cup strawberries 1 ounce pretzels  Daily Sum Nutrient Unit Value  Macronutrients  Energy kcal 1560  Energy kJ 6541  Protein g 103  Total lipid (fat) g 23  Carbohydrate, by difference g 241  Fiber, total dietary g 23  Sugars, total g 104  Minerals  Calcium, Ca mg 1159  Iron, Fe mg 12  Sodium, Na mg 2034  Vitamins  Vitamin C, total ascorbic acid mg 158  Vitamin A, IU IU 28445  Vitamin D IU 457  Lipids  Fatty acids, total saturated g 6  Fatty acids, total monounsaturated g 6  Fatty acids, total polyunsaturated g 8  Cholesterol mg 123      

## 2022-07-02 NOTE — Progress Notes (Signed)
Initial Nutrition Assessment RD working remotely.  DOCUMENTATION CODES:   Not applicable  INTERVENTION:  - will order Glucerna Shake TID, each supplement provides 220 kcal and 10 grams of protein.  - will Will order 30 ml Prosource Plus BID, each supplement provides 100 kcal and 15 grams protein.   - will order 1 tablet multivitamin with minerals and 1 tablet vitamin B complex with vitamin C/day.   - check serum vitamin D on 7/31 AM.   - entered Pancreatitis Nutrition Therapy handout in [facility].   NUTRITION DIAGNOSIS:   Increased nutrient needs related to acute illness as evidenced by estimated needs.  GOAL:   Patient will meet greater than or equal to 90% of their needs  MONITOR:   PO intake, Supplement acceptance, Labs, Weight trends  REASON FOR ASSESSMENT:   Malnutrition Screening Tool  ASSESSMENT:   66 year old male with medical history of HTN, severe combined systolic/diastolic CHF, stroke with L-sided residual weakness, CAD, type 2 DM, and HLD. He presented to the ED due to N/V, weakness, and abdominal pain. In the ED, lipase was in the 3000s and UA was concerning for UTI. CT abdomen/pelvis showed prominent intrahepatic bile duct dilatation. 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 No drainable fluid collection/walled-off necrosis, mild intrahepatic ductal dilatation, normal CBD, distended gallbladder without signs of inflammation. Patient was admitted for the management of idiopathic pancreatitis. GI consulted.  Diet advanced from CLD to Heart Healthy/Carb Modified yesterday at 1055. Ht consumed 100% of dinner on 7/28 and 100% of breakfast on 7/29 on CLD and 75% of lunch and dinner yesterday on current diet.  MST score of 2.0 and patient reported he has lost weight but he is unsure how much.   He has not been assessed by a [place] RD since 07/02/2018.   Weight today is 168 lb and PTA the most recently documented weight was 175 lb on 4/24. This indicates 7 lb  weight loss (4% body weight) in 3 months.   GI note from today states HIDA scan is ordered and possible need for EUS dependent on liver enzymes and HIDA result.    Labs reviewed; CBG: 113 mg/dl, creatinine: 1.45 mg/dl, Alk Phos and LFTs elevated and up from 7/29, GFR: 53 ml/min.  Medications reviewed; sliding scale novolog.     NUTRITION - FOCUSED PHYSICAL EXAM:  RD working remotely.   Diet Order:   Diet Order             Diet heart healthy/carb modified Room service appropriate? Yes; Fluid consistency: Thin  Diet effective now                   EDUCATION NEEDS:   Education needs have been addressed  Skin:  Skin Assessment: Reviewed RN Assessment  Last BM:  7/29 (type 3 x1, medium amount)  Height:   Ht Readings from Last 1 Encounters:  06/30/22 _0  (1.88 m)    Weight:   Wt Readings from Last 1 Encounters:  07/02/22 76.3 kg     BMI:  Body mass index is 21.6 kg/m.  Estimated Nutritional Needs:  Kcal:  2200-2450 kcal Protein:  95-110 grams Fluid:  >/= 2.3 L/day      Jarome Matin, MS, RD, LDN, CNSC Registered Dietitian II Inpatient Clinical Nutrition RD pager # and on-call/weekend pager # available in [facility]

## 2022-07-02 NOTE — Progress Notes (Addendum)
ANTICOAGULATION CONSULT NOTE  Pharmacy Consult for warfarin Indication: hx stroke  Allergies  Allergen Reactions   Lipitor [Atorvastatin] Nausea And Vomiting    Patient Measurements: Height: 6\' 2"  (188 cm) Weight: 76.3 kg (168 lb 3.4 oz) IBW/kg (Calculated) : 82.2 Heparin Dosing Weight:   Vital Signs: Temp: 98.1 F (36.7 C) (07/30 0600) Temp Source: Oral (07/30 0600) BP: 108/70 (07/30 0600) Pulse Rate: 58 (07/30 0600)  Labs: Recent Labs    06/29/22 2130 06/29/22 2318 06/30/22 0510 06/30/22 0537 07/01/22 0546 07/01/22 1211 07/02/22 0532  HGB  --   --   --  11.6* 11.1*  --  11.7*  HCT  --   --   --  37.7* 35.4*  --  38.1*  PLT  --   --   --  216 215  --  233  LABPROT  --   --   --  31.7* 43.2*  --  28.1*  INR  --   --   --  3.1* 4.6*  --  2.7*  CREATININE  --   --  1.18  --  1.29* 1.48* 1.45*  TROPONINIHS 41* 50* 38*  --   --   --   --      Estimated Creatinine Clearance: 53.4 mL/min (A) (by C-G formula based on SCr of 1.45 mg/dL (H)).   Medications:  - PTA warfarin regimen: 5mg  daily except 7.5 mg on Mon and Fri (last dose taken at 8a on 06/29/22 PTA)  Assessment: Patient is a 66 y.o M with hx CVA on warfarin PTA who presented to the ED on 06/29/22 with c/o abdominal pain, n/v and generalized weakness.  Pharmacy has been consulted to resume warfarin.  Today, 07/02/2022: - INR now therapeutic at 2.7 (doses held 7/28 and 7/29 for large increase in INR to supratherapeutic range) - CBC stable, AST/ALT elevated and increasing - Documented eating 100% of meals, advancing diet - DDI: recent use of broad-spectrum antibiotics can lead to an increase in INR  Goal of Therapy:  INR 2-3 Monitor platelets by anticoagulation protocol: Yes   Plan:  - GI following and may require EUS this admission - MD recommends to hold warfarin at this time in case procedures needed - Can consider bridging with parenteral anticoagulation if needed once INR < 2  Pharmacy to formally  sign off warfarin consult. Please re-consult if further anticoagulation is needed.  8/28, PharmD, BCPS 07/02/2022 10:02 AM

## 2022-07-02 NOTE — Progress Notes (Signed)
  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   Recommendations   Acute pancreatitis --unclear etiology, mild and much improved.  No clinical symptoms of ongoing pancreatitis.  Differential most likely biliary sludge. --Under control and likely resolved --Continue low-fat diet  2.  Choledochocyst (Type V)/intrahepatic biliary ductal dilatation/elevated liver enzymes --no suspicion for cholangitis though AST ALT and alk phos are increasing.  Bilirubin has decreased he is certainly not jaundiced.  Concern about bile duct patency given biliary sludge and stasis associated with choledochal cyst.  Discussed with Dr. Mansouraty and will proceed with HIDA scan to assess bile duct patency, consideration for EUS depending on results --Trend liver enzymes --He does not need antibiotics for this issue at this time --HIDA scan ordered --Possible EUS depending on liver enzymes and HIDA result  3.  Supratherapeutic INR --resolved and INR back in the therapeutic range --Warfarin per pharmacy; would recommend we hold this for now in the event of EUS  4.  E. coli UTI --currently on cefdinir, per hospitalist medicine  Dr. Cunningham will assume GI service tomorrow for [facility]    Chief Complaint   Patient feels well this morning, no nausea or vomiting, no abdominal pain, tolerating regular diet He had a bowel movement which was nonbloody and nonmelenic  Vital signs in last 24 hours: Temp:  [97.9 F (36.6 C)-98.1 F (36.7 C)] 98.1 F (36.7 C) (07/30 0600) Pulse Rate:  [58-76] 58 (07/30 0600) Resp:  [18] 18 (07/29 1346) BP: (102-110)/(63-71) 108/70 (07/30 0600) SpO2:  [100 %] 100 % (07/30 0600) Weight:  [76.3 kg] 76.3 kg (07/30 0500) Last BM Date :  07/01/22  Gen: awake, alert, NAD HEENT: anicteric, op clear CV: RRR, no mrg Pulm: CTA b/l Abd: soft, NT/ND, +BS throughout Ext: no c/c/e Neuro: nonfocal   Intake/Output from previous day: 07/29 0701 - 07/30 0700 In: 898 [P.O.:898] Out: 252 [Urine:251; Stool:1] Intake/Output this shift: Total I/O In: -  Out: 400 [Urine:400]  Lab Results: Recent Labs    06/30/22 0537 07/01/22 0546 07/02/22 0532  WBC 11.7* 7.5 7.4  HGB 11.6* 11.1* 11.7*  HCT 37.7* 35.4* 38.1*  PLT 216 215 233   BMET Recent Labs    07/01/22 0546 07/01/22 1211 07/02/22 0532  NA 139 137 137  K 4.6 4.5 4.5  CL 111 108 107  CO2 21* 23 24  GLUCOSE 109* 135* 120*  BUN 18 17 16  CREATININE 1.29* 1.48* 1.45*  CALCIUM 8.7* 8.9 8.8*   LFT Recent Labs    06/29/22 1520 06/30/22 0510 07/02/22 0532  PROT 8.0   < > 7.1  ALBUMIN 3.6   < > 2.9*  AST 84*   < > 322*  ALT 81*   < > 326*  ALKPHOS 103   < > 364*  BILITOT 1.3*   < > 0.9  BILIDIR 0.8*  --   --   IBILI 0.5  --   --    < > = values in this interval not displayed.   PT/INR Recent Labs    07/01/22 0546 07/02/22 0532  LABPROT 43.2* 28.1*  INR 4.6* 2.7*     Studies/Results: US Abdomen Limited RUQ (LIVER/GB)  Result Date: 06/30/2022 CLINICAL DATA:  Pancreatitis. EXAM: ULTRASOUND ABDOMEN LIMITED RIGHT UPPER QUADRANT COMPARISON:  CT 06/29/2022, MRI   06/30/2022 FINDINGS: Gallbladder: Gallbladder is mildly distended to 5 cm. Gallbladder wall thickening to 3 mm. Dependent sludge within the lumen the gallbladder. No shadowing gallstones are present. Negative sonographic Murphy's sign. Common bile duct: Diameter: Normal diameter scratch the upper limits of normal at 6 mm. Liver: Anechoic cysts within the liver parenchyma. No biliary duct dilatation. Portal vein is patent on color Doppler imaging with normal direction of blood flow towards the liver. Other: None. IMPRESSION: 1. No echogenic gallstones.  Sludge within the gallbladder. 2. Mild  gallbladder distension.  No pericholecystic fluid. 3. Negative sonographic Murphy's sign. 4. Common bile duct normal diameter. Electronically Signed   By: Stewart  Edmunds M.D.   On: 06/30/2022 14:41      LOS: 2 days    M , MD 07/02/2022, 10:23 AM See AMION, [facility], to contact our on call provider   

## 2022-07-03 ENCOUNTER — Inpatient Hospital Stay (HOSPITAL_COMMUNITY): Payer: Medicare Other

## 2022-07-03 DIAGNOSIS — K8500 Idiopathic acute pancreatitis without necrosis or infection: Secondary | ICD-10-CM

## 2022-07-03 DIAGNOSIS — K835 Biliary cyst: Secondary | ICD-10-CM

## 2022-07-03 DIAGNOSIS — K831 Obstruction of bile duct: Secondary | ICD-10-CM | POA: Diagnosis not present

## 2022-07-03 LAB — PROTIME-INR
INR: 1.7 — ABNORMAL HIGH (ref 0.8–1.2)
Prothrombin Time: 19.4 seconds — ABNORMAL HIGH (ref 11.4–15.2)

## 2022-07-03 LAB — CBC WITH DIFFERENTIAL/PLATELET
Abs Immature Granulocytes: 0.06 10*3/uL (ref 0.00–0.07)
Basophils Absolute: 0 10*3/uL (ref 0.0–0.1)
Basophils Relative: 0 %
Eosinophils Absolute: 0.1 10*3/uL (ref 0.0–0.5)
Eosinophils Relative: 2 %
HCT: 40.4 % (ref 39.0–52.0)
Hemoglobin: 12.6 g/dL — ABNORMAL LOW (ref 13.0–17.0)
Immature Granulocytes: 1 %
Lymphocytes Relative: 10 %
Lymphs Abs: 0.7 10*3/uL (ref 0.7–4.0)
MCH: 24.6 pg — ABNORMAL LOW (ref 26.0–34.0)
MCHC: 31.2 g/dL (ref 30.0–36.0)
MCV: 78.8 fL — ABNORMAL LOW (ref 80.0–100.0)
Monocytes Absolute: 0.6 10*3/uL (ref 0.1–1.0)
Monocytes Relative: 8 %
Neutro Abs: 5.6 10*3/uL (ref 1.7–7.7)
Neutrophils Relative %: 79 %
Platelets: 249 10*3/uL (ref 150–400)
RBC: 5.13 MIL/uL (ref 4.22–5.81)
RDW: 16 % — ABNORMAL HIGH (ref 11.5–15.5)
WBC: 7.2 10*3/uL (ref 4.0–10.5)
nRBC: 0 % (ref 0.0–0.2)

## 2022-07-03 LAB — COMPREHENSIVE METABOLIC PANEL
ALT: 274 U/L — ABNORMAL HIGH (ref 0–44)
AST: 162 U/L — ABNORMAL HIGH (ref 15–41)
Albumin: 3 g/dL — ABNORMAL LOW (ref 3.5–5.0)
Alkaline Phosphatase: 373 U/L — ABNORMAL HIGH (ref 38–126)
Anion gap: 7 (ref 5–15)
BUN: 16 mg/dL (ref 8–23)
CO2: 24 mmol/L (ref 22–32)
Calcium: 9.5 mg/dL (ref 8.9–10.3)
Chloride: 106 mmol/L (ref 98–111)
Creatinine, Ser: 1.31 mg/dL — ABNORMAL HIGH (ref 0.61–1.24)
GFR, Estimated: 60 mL/min — ABNORMAL LOW (ref >60)
Glucose, Bld: 112 mg/dL — ABNORMAL HIGH (ref 70–99)
Potassium: 4.8 mmol/L (ref 3.5–5.1)
Sodium: 137 mmol/L (ref 135–145)
Total Bilirubin: 0.9 mg/dL (ref 0.3–1.2)
Total Protein: 7.3 g/dL (ref 6.5–8.1)

## 2022-07-03 LAB — GLUCOSE, CAPILLARY
Glucose-Capillary: 107 mg/dL — ABNORMAL HIGH (ref 70–99)
Glucose-Capillary: 105 mg/dL — ABNORMAL HIGH (ref 70–99)
Glucose-Capillary: 165 mg/dL — ABNORMAL HIGH (ref 70–99)
Glucose-Capillary: 160 mg/dL — ABNORMAL HIGH (ref 70–99)

## 2022-07-03 LAB — VITAMIN D 25 HYDROXY (VIT D DEFICIENCY, FRACTURES): Vit D, 25-Hydroxy: 21.69 ng/mL — ABNORMAL LOW (ref 30–100)

## 2022-07-03 MED ORDER — WARFARIN SODIUM 5 MG PO TABS
5.0000 mg | ORAL_TABLET | Freq: Once | ORAL | Status: AC
  Administered 2022-07-03: 5 mg via ORAL
  Filled 2022-07-03: qty 1

## 2022-07-03 MED ORDER — WARFARIN - PHARMACIST DOSING INPATIENT: Freq: Every day | Status: DC

## 2022-07-03 MED ORDER — TECHNETIUM TC 99M MEBROFENIN IV KIT
5.2000 | PACK | Freq: Once | INTRAVENOUS | Status: AC | PRN
  Administered 2022-07-03: 5.2 via INTRAVENOUS

## 2022-07-03 MED ORDER — VITAMIN D (ERGOCALCIFEROL) 1.25 MG (50000 UNIT) PO CAPS
50000.0000 [IU] | ORAL_CAPSULE | ORAL | Status: DC
  Administered 2022-07-03: 50000 [IU] via ORAL
  Filled 2022-07-03: qty 1

## 2022-07-03 MED ORDER — MORPHINE SULFATE (PF) 4 MG/ML IV SOLN
3.0000 mg | Freq: Once | INTRAVENOUS | Status: AC
  Administered 2022-07-03: 3 mg via INTRAVENOUS

## 2022-07-03 MED ORDER — MORPHINE SULFATE (PF) 4 MG/ML IV SOLN
3.0000 mg | Freq: Once | INTRAVENOUS | Status: DC
Start: 2022-07-03 — End: 2022-07-03

## 2022-07-03 MED ORDER — MORPHINE SULFATE (PF) 2 MG/ML IV SOLN
INTRAVENOUS | Status: AC
  Filled 2022-07-03: qty 2

## 2022-07-03 NOTE — Progress Notes (Signed)
PROGRESS NOTE  EEAN BUSS  WUJ:[id number] DOB: 28-Feb-1956 DOA: 06/29/2022 PCP: Pincus Sanes, MD   Brief Narrative:  Patient is a 66 year old male with history of hypertension, severe combined systolic/diastolic congestive heart failure with EF of  20%, stroke with left-sided residual weakness, coronary disease, diabetes type 2, hyperlipidemia who presented from home with complaint of weakness, nausea, vomiting along with abdominal pain.  On presentation, he was hemodynamically stable.  Lab work showed creatinine of 1.7.  Mildly elevated LFTs.  Lipase was elevated in the range of 3000's.  UA was suspicious for UTI.  CT abdomen/pelvis showed prominent intrahepatic bile duct dilatation.  MRCP was done showing  pancreatitis.Patient was admitted for the management of idiopathic pancreatitis.  GI consulted.  Hospital course remarkable for elevated liver enzymes.  Possible plan for discharge tomorrow to home after GI clearance if further improvement in the liver enzymes.  Assessment & Plan:  Principal Problem:   Biliary obstruction Active Problems:   Essential hypertension   Chronic systolic heart failure (HCC)   Diabetes (HCC)   Dyslipidemia   BPH without urinary obstruction   Coronary artery disease   History of CVA (cerebrovascular accident)   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   Spastic hemiparesis (HCC)   Pancreatitis   Suspected UTI   Acute pancreatitis   Pancreatitis/elevated lipase: Unclear etiology.  Patient does not consume alcohol.  Triglyceride level normal.  No contributing new medication that could cause pancreatitis.  When he presented he complained of lower abdominal pain instead of epigastric pain. Lipase level has normalised.  But liver enzymes started going  up.  He denies any abdominal pain today.  HIDA scan does not show any features of cholecystitis, patent cystic duct Diet advanced to low-fat diet.  Tolerating diet.  Dilated biliary ducts: Imaging as above.   GI consulted.   Bilirubin almost normal.  Normal CBD.  Distended gallbladder with sludge without signs of inflammation.  HIDA scan as above.  We also recommend to follow-up with hepatobiliary surgery as an outpatient  Severe systolic congestive/diastolic heart failure: Follows with cardiology.  Echo done here showed EF of 20 to 25%.  Currently appears euvolemic. Monitor on telemetry.  He had 15 runs of V. tach on 7/28  History of coronary artery disease/elevated troponin: Elevated troponin most likely from demand ischemia from severe CHF.  He does not have any chest pain.  Troponins are flat trend He is on carvedilol, losartan, statin at home  Suspicion for UTI: Started on ceftriaxone.  UA showed some bacteria, leukocytes.  He was complaining of lower abdominal discomfort/burning while passing urine.  Urine culture showed E. coli.  Antibiotics changed to oral  History of CVA: On warfarin.  Has residual mild left-sided weakness.  Ambulates with the help of cane.  On statin at home.  We will consult PT.  CKD stage 3a: On review of his previous lab works, his creatinine has fluctuated between 1-2.  Currently within range.  History of paroxysmal A-fib: Currently in normal sinus rhythm.  On warfarin for anticoagulation.  Monitor on telemetry.   Hypertension: On  carvedilol, losartan at home, continue low-dose carvedilol here due to soft blood pressure.  Losartan on hold.  Diabetes type 2: On sliding scale insulin.  Monitor blood sugars  Hyperlipidemia: On Crestor.  Can be held now because of  elevated liver enzymes.  Hypokalemia/hypomagnesemia: Being monitored  Low vitamin D: Started on supplementation    Nutrition Problem: Increased nutrient needs Etiology: acute  illness    DVT prophylaxis:  Warfarin warfarin (COUMADIN) tablet 5 mg     Code Status: Full Code  Family Communication:: Discussed with daughter on phone on 7/28.Called again today,call not received  Patient status:  Inpatient  Patient is from :Home  Anticipated discharge NW:GNFA  Estimated DC date: Likely tomorrow if further improvement in the liver function, needs GI clearance before discharge   Consultants: GI  Procedures: HIDA scan  Antimicrobials:  Anti-infectives (From admission, onward)    Start     Dose/Rate Route Frequency Ordered Stop   07/01/22 1045  cefdinir (OMNICEF) capsule 300 mg        300 mg Oral Every 12 hours 07/01/22 0957 07/06/22 0959   06/30/22 2000  cefTRIAXone (ROCEPHIN) 1 g in sodium chloride 0.9 % 100 mL IVPB  Status:  Discontinued        1 g 200 mL/hr over 30 Minutes Intravenous Every 24 hours 06/29/22 2119 07/01/22 0957   06/29/22 1930  cefTRIAXone (ROCEPHIN) 1 g in sodium chloride 0.9 % 100 mL IVPB        1 g 200 mL/hr over 30 Minutes Intravenous  Once 06/29/22 1915 06/29/22 2112       Subjective: Patient seen and examined at bedside this morning.  Hemodynamically stable.  Comfortable, sitting in the chair.  Denies any abdominal pain, nausea or vomiting.  He was about to go for HIDA scan.  Objective: Vitals:   07/02/22 1928 07/03/22 0334 07/03/22 0335 07/03/22 1211  BP: 112/69 123/76 123/76 120/76  Pulse: 66 64 64 76  Resp: 16 19 18 17   Temp: 98.2 F (36.8 C) 97.8 F (36.6 C) 97.8 F (36.6 C) 98.4 F (36.9 C)  TempSrc: Oral Oral Oral Oral  SpO2: 99% 100% 100% 98%  Weight:      Height:        Intake/Output Summary (Last 24 hours) at 07/03/2022 1523 Last data filed at 07/03/2022 1234 Gross per 24 hour  Intake 310 ml  Output 1150 ml  Net -840 ml   Filed Weights   06/29/22 1455 07/01/22 0500 07/02/22 0500  Weight: 68 kg 78.1 kg 76.3 kg    Examination:  General exam: Overall comfortable, not in distress, very pleasant gentleman HEENT: PERRL Respiratory system:  no wheezes or crackles  Cardiovascular system: S1 & S2 heard, RRR.  Gastrointestinal system: Abdomen is nondistended, soft and nontender. Central nervous system: Alert and  oriented Extremities: No edema, no clubbing ,no cyanosis Skin: No rashes, no ulcers,no icterus        Data Reviewed: I have personally reviewed following labs and imaging studies  CBC: Recent Labs  Lab 06/29/22 1520 06/30/22 0537 07/01/22 0546 07/02/22 0532 07/03/22 0510  WBC 9.0 11.7* 7.5 7.4 7.2  NEUTROABS  --   --   --   --  5.6  HGB 13.2 11.6* 11.1* 11.7* 12.6*  HCT 42.7 37.7* 35.4* 38.1* 40.4  MCV 80.1 80.0 79.0* 80.0 78.8*  PLT 229 216 215 233 249   Basic Metabolic Panel: Recent Labs  Lab 06/30/22 0510 07/01/22 0546 07/01/22 1211 07/02/22 0532 07/03/22 0510  NA 140 139 137 137 137  K 3.3* 4.6 4.5 4.5 4.8  CL 117* 111 108 107 106  CO2 16* 21* 23 24 24   GLUCOSE 90 109* 135* 120* 112*  BUN 22 18 17 16 16   CREATININE 1.18 1.29* 1.48* 1.45* 1.31*  CALCIUM 6.5* 8.7* 8.9 8.8* 9.5  MG 1.6*  --   --   --   --  Recent Results (from the past 240 hour(s))  Urine Culture     Status: Abnormal   Collection Time: 06/29/22  7:16 PM   Specimen: Urine, Clean Catch  Result Value Ref Range Status   Specimen Description   Final    URINE, CLEAN CATCH Performed at [facility], [place] [address], [place], [place] [address]    Special Requests   Final    NONE Performed at [facility], [place] [address] [place], [place] [address]    Culture >=100,000 COLONIES/mL ESCHERICHIA COLI (A)  Final   Report Status 07/01/2022 FINAL  Final   Organism ID, Bacteria ESCHERICHIA COLI (A)  Final      Susceptibility   Escherichia coli - MIC*    AMPICILLIN <=2 SENSITIVE Sensitive     CEFAZOLIN <=4 SENSITIVE Sensitive     CEFEPIME <=0.12 SENSITIVE Sensitive     CEFTRIAXONE <=0.25 SENSITIVE Sensitive     CIPROFLOXACIN <=0.25 SENSITIVE Sensitive     GENTAMICIN <=1 SENSITIVE Sensitive     IMIPENEM <=0.25 SENSITIVE Sensitive     NITROFURANTOIN <=16 SENSITIVE Sensitive     TRIMETH/SULFA <=20 SENSITIVE Sensitive     AMPICILLIN/SULBACTAM <=2 SENSITIVE  Sensitive     PIP/TAZO <=4 SENSITIVE Sensitive     * >=100,000 COLONIES/mL ESCHERICHIA COLI     Radiology Studies: NM Hepatobiliary Liver Func  Result Date: 07/03/2022 CLINICAL DATA:  Intrahepatic biliary ductal dilatation and central liver cysts. Increasing liver enzymes. Abdominal pain. Nausea and vomiting. EXAM: NUCLEAR MEDICINE HEPATOBILIARY IMAGING TECHNIQUE: Sequential images of the abdomen were obtained out to 60 minutes following intravenous administration of radiopharmaceutical. 3 mg morphine IV administered after 60 minutes, with additional 30 minutes of images obtained. RADIOPHARMACEUTICALS:  5.2 mCi Tc-43m  Choletec IV COMPARISON:  06/30/2022 abdominal sonogram and MRI abdomen. 06/29/2022 CT abdomen/pelvis. FINDINGS: Prompt uptake and biliary excretion of activity by the liver is seen. Gallbladder activity is visualized after IV morphine administration, consistent with patency of cystic duct. Biliary activity passes into small bowel, consistent with patent common bile duct. IMPRESSION: 1. Patent cystic duct demonstrated, which is not compatible with acute cholecystitis. 2. Patent common bile duct demonstrated. Electronically Signed   By: Ilona Sorrel M.D.   On: 07/03/2022 13:04    Scheduled Meds:  (feeding supplement) PROSource Plus  30 mL Oral BID BM   B-complex with vitamin C  1 tablet Oral Daily   carvedilol  3.125 mg Oral BID WC   cefdinir  300 mg Oral Q12H   feeding supplement (GLUCERNA SHAKE)  237 mL Oral TID BM   insulin aspart  0-9 Units Subcutaneous TID WC   morphine (PF)       multivitamin with minerals  1 tablet Oral Daily   sodium chloride flush  3 mL Intravenous Q12H   Vitamin D (Ergocalciferol)  50,000 Units Oral Q7 days   warfarin  5 mg Oral ONCE-1600   Warfarin - Pharmacist Dosing Inpatient   Does not apply q1600   Continuous Infusions:     LOS: 3 days   Shelly Coss, MD [facility] P7/31/2023, 3:23 PM

## 2022-07-03 NOTE — Progress Notes (Addendum)
Progress Note  Primary GI: Unassigned (Dr. Hilarie Fredrickson first contact)   Subjective  Chief Complaint:Biliary duct dilitation  Patient back from HIDA scan. Comfortably in bed eating meatloaf, denies nausea vomiting. Denies any abdominal pain. Last bowel movement was last night, no hematochezia or melena    Objective   Vital signs in last 24 hours: Temp:  [97.8 F (36.6 C)-98.4 F (36.9 C)] 98.4 F (36.9 C) (07/31 1211) Pulse Rate:  [64-76] 76 (07/31 1211) Resp:  [16-19] 17 (07/31 1211) BP: (112-123)/(69-76) 120/76 (07/31 1211) SpO2:  [98 %-100 %] 98 % (07/31 1211) Last BM Date : 07/01/22 Last BM recorded by nurses in past 5 days Stool Type: Type 3 (Sausage shape with surface cracks) (07/03/2022  2:41 AM)  General:   male in no acute distress, poor dentition Heart:  Regular rate and rhythm; no murmurs Pulm: Clear anteriorly; no wheezing Abdomen:  Soft, Obese AB, Active bowel sounds. No tenderness . Without guarding and Without rebound, No organomegaly appreciated. Extremities:  without  edema. Neurologic:  Alert and  oriented x4;  No focal deficits.  Psych:  Cooperative. Normal mood and affect.  Intake/Output from previous day: 07/30 0701 - 07/31 0700 In: 790 [P.O.:790] Out: 2450 [Urine:2450] Intake/Output this shift: No intake/output data recorded.  Studies/Results: NM Hepatobiliary Liver Func  Result Date: 07/03/2022 CLINICAL DATA:  Intrahepatic biliary ductal dilatation and central liver cysts. Increasing liver enzymes. Abdominal pain. Nausea and vomiting. EXAM: NUCLEAR MEDICINE HEPATOBILIARY IMAGING TECHNIQUE: Sequential images of the abdomen were obtained out to 60 minutes following intravenous administration of radiopharmaceutical. 3 mg morphine IV administered after 60 minutes, with additional 30 minutes of images obtained. RADIOPHARMACEUTICALS:  5.2 mCi Tc-73m Choletec IV COMPARISON:  06/30/2022 abdominal sonogram and MRI abdomen. 06/29/2022 CT abdomen/pelvis.  FINDINGS: Prompt uptake and biliary excretion of activity by the liver is seen. Gallbladder activity is visualized after IV morphine administration, consistent with patency of cystic duct. Biliary activity passes into small bowel, consistent with patent common bile duct. IMPRESSION: 1. Patent cystic duct demonstrated, which is not compatible with acute cholecystitis. 2. Patent common bile duct demonstrated. Electronically Signed   By: JIlona SorrelM.D.   On: 07/03/2022 13:04    Lab Results: Recent Labs    07/01/22 0546 07/02/22 0532 07/03/22 0510  WBC 7.5 7.4 7.2  HGB 11.1* 11.7* 12.6*  HCT 35.4* 38.1* 40.4  PLT 215 233 249   BMET Recent Labs    07/01/22 1211 07/02/22 0532 07/03/22 0510  NA 137 137 137  K 4.5 4.5 4.8  CL 108 107 106  CO2 '23 24 24  ' GLUCOSE 135* 120* 112*  BUN '17 16 16  ' CREATININE 1.48* 1.45* 1.31*  CALCIUM 8.9 8.8* 9.5   LFT Recent Labs    07/03/22 0510  PROT 7.3  ALBUMIN 3.0*  AST 162*  ALT 274*  ALKPHOS 373*  BILITOT 0.9   PT/INR Recent Labs    07/02/22 0532 07/03/22 0510  LABPROT 28.1* 19.4*  INR 2.7* 1.7*      Patient profile:   66year old male admitted with acute groove pancreatitis, 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   Impression/Plan:   Acute pancreatitis  no clinical symptoms ongoing, differential most likely biliary Continue low-fat diet  Choledochocyst/intrahepatic biliary ductal dilatation/elevated liver enzymes WBC 7.2 HGB 12.6 Platelets 249-no white blood cell count elevation or fevers. AST 162 ALT 274 -AST  and ALT downtrending Alkphos 373 TBili 0.9-alk phos steady, negative bilirubin. 07/03/2022 INR 1.7  HIDA ordered today negative for acute cholecystitis With downtrending LFTs doubtful need endoscopic evaluation this time. We will need outpatient hepatobiliary  surgery consult  Supratherapeutic INR History of being on warfarin for CVA On hold at this time, per primary team  E. coli UTI Hospitalist medicine    LOS: 3 days   Vladimir Crofts  07/03/2022, 1:57 PM  ----------------------------------------------------------------------------  I have taken a history, reviewed the chart and examined the patient. I performed a substantive portion of this encounter, including complete performance of at least one of the key components, in conjunction with the APP. I agree with the APP's note, impression and recommendations  66 year old male admitted with symptoms of abdominal pain, nausea and vomiting, found to have significantly elevated lipase, but normal-appearing pancreas, but also found to have distended gallbladder and numerous cystic dilations of the intrahepatic bile ducts, suggestive of Caroli His liver enzymes were initially elevated, but are downtrending.  His bilirubin has been normal throughout. His abdominal pain, nausea and vomiting have resolved completely.  The etiology of his pancreatitis/symptoms is unclear, but certainly biliary sludge remains possible.  He does not have acute cholecystitis based on his normal HIDA scan today. He is high risk from a surgical standpoint due to his very low EF (20 to 25%).  Therefore I would not recommend reflexive cholecystectomy without a more certain indication.  The likelihood of him having cholangitis seems very low based on his normal bilirubin and no evidence of biliary obstruction  He does need to be evaluated by hepatobiliary surgery because of the biliary cysts and the risk for cholangiocarcinoma.  Would defer to surgery on the risks and benefits of cholecystectomy. I believe this can all be done as an outpatient.  No plans for endoscopic evaluation/treatment at this time.  Patient can resume Coumadin per primary team. Treatment of UTI per primary team    Wana Mount E. Candis Schatz, MD [facility]  Gastroenterology

## 2022-07-03 NOTE — Progress Notes (Signed)
  Transition of Care [facility]) Screening Note   Patient Details  Name: Karl [name] Date of Birth: [birth year]   Transition of Care [facility]) CM/SW Contact:    Otelia Santee, LCSW Phone Number: 07/03/2022, 12:58 PM    Transition of Care Department [facility]) has reviewed patient and no TOC needs have been identified at this time. We will continue to monitor patient advancement through interdisciplinary progression rounds. If new patient transition needs arise, please place a TOC consult.

## 2022-07-03 NOTE — Progress Notes (Addendum)
   07/03/22 1400  Mobility  Activity Ambulated with assistance in hallway  Level of Assistance Minimal assist, patient does 75% or more  Assistive Device Front wheel walker  Distance Ambulated (ft) 120 ft  Activity Response Tolerated well  $Mobility charge 1 Mobility   Pt was unsteady during ambulation, however no LOB. States that the unsteadiness is close to baseline for him from a stroke in 2019. Pt was left in chair with nurse tech present with him. All necessities within reach.   Marilynne Halsted  Mobility Specialist

## 2022-07-03 NOTE — Progress Notes (Signed)
ANTICOAGULATION CONSULT NOTE  Pharmacy Consult for warfarin Indication: hx stroke  Allergies  Allergen Reactions   Lipitor [Atorvastatin] Nausea And Vomiting    Patient Measurements: Height: 6\' 2"  (188 cm) Weight: 76.3 kg (168 lb 3.4 oz) IBW/kg (Calculated) : 82.2 Heparin Dosing Weight:   Vital Signs: Temp: 98.4 F (36.9 C) (07/31 1211) Temp Source: Oral (07/31 1211) BP: 120/76 (07/31 1211) Pulse Rate: 76 (07/31 1211)  Labs: Recent Labs    07/01/22 0546 07/01/22 1211 07/02/22 0532 07/03/22 0510  HGB 11.1*  --  11.7* 12.6*  HCT 35.4*  --  38.1* 40.4  PLT 215  --  233 249  LABPROT 43.2*  --  28.1* 19.4*  INR 4.6*  --  2.7* 1.7*  CREATININE 1.29* 1.48* 1.45* 1.31*     Estimated Creatinine Clearance: 59.1 mL/min (A) (by C-G formula based on SCr of 1.31 mg/dL (H)).   Medications:  - PTA warfarin regimen: 5mg  daily except 7.5 mg on Mon and Fri (last dose taken at 8a on 06/29/22 PTA)  Assessment: Patient is a 66 y.o M with hx CVA on warfarin PTA who presented to the ED on 06/29/22 with c/o abdominal pain, n/v and generalized weakness.  Pharmacy has been consulted to resume warfarin.  Today, 07/03/2022: 07/01/22 to resume warfarin today as no further procedures planned per GI  - INR now subtherapeutic at 1.7 (doses held 7/28, 7/29, and 7/30 for large increase in INR to supratherapeutic range) - CBC stable, AST/ALT elevated and increasing - Documented eating 100% of meals, advancing diet - DDI: recent use of broad-spectrum antibiotics can lead to an increase in INR  Goal of Therapy:  INR 2-3 Monitor platelets by anticoagulation protocol: Yes   Plan:  - Warfarin 5 mg PO x1  - Daily INR  -  Monitor for signs and symptoms of bleeding  8/29, PharmD, BCPS 07/03/2022 3:02 PM

## 2022-07-03 NOTE — Plan of Care (Signed)

## 2022-07-04 ENCOUNTER — Other Ambulatory Visit: Payer: Self-pay | Admitting: Internal Medicine

## 2022-07-04 DIAGNOSIS — K831 Obstruction of bile duct: Secondary | ICD-10-CM | POA: Diagnosis not present

## 2022-07-04 LAB — COMPREHENSIVE METABOLIC PANEL
ALT: 199 U/L — ABNORMAL HIGH (ref 0–44)
AST: 85 U/L — ABNORMAL HIGH (ref 15–41)
Albumin: 2.9 g/dL — ABNORMAL LOW (ref 3.5–5.0)
Alkaline Phosphatase: 357 U/L — ABNORMAL HIGH (ref 38–126)
Anion gap: 7 (ref 5–15)
BUN: 21 mg/dL (ref 8–23)
CO2: 24 mmol/L (ref 22–32)
Calcium: 9.1 mg/dL (ref 8.9–10.3)
Chloride: 104 mmol/L (ref 98–111)
Creatinine, Ser: 1.3 mg/dL — ABNORMAL HIGH (ref 0.61–1.24)
GFR, Estimated: >60 mL/min (ref >60)
Glucose, Bld: 119 mg/dL — ABNORMAL HIGH (ref 70–99)
Potassium: 4.4 mmol/L (ref 3.5–5.1)
Sodium: 135 mmol/L (ref 135–145)
Total Bilirubin: 0.9 mg/dL (ref 0.3–1.2)
Total Protein: 7.2 g/dL (ref 6.5–8.1)

## 2022-07-04 LAB — GLUCOSE, CAPILLARY
Glucose-Capillary: 111 mg/dL — ABNORMAL HIGH (ref 70–99)
Glucose-Capillary: 170 mg/dL — ABNORMAL HIGH (ref 70–99)

## 2022-07-04 LAB — PROTIME-INR
INR: 1.3 — ABNORMAL HIGH (ref 0.8–1.2)
Prothrombin Time: 16.5 seconds — ABNORMAL HIGH (ref 11.4–15.2)

## 2022-07-04 MED ORDER — CEFDINIR 300 MG PO CAPS: 300.0000 mg | ORAL_CAPSULE | Freq: Two times a day (BID) | ORAL | 0 refills | Status: AC

## 2022-07-04 MED ORDER — CARVEDILOL 3.125 MG PO TABS: 3.1250 mg | ORAL_TABLET | Freq: Two times a day (BID) | ORAL | 0 refills | Status: DC

## 2022-07-04 MED ORDER — VITAMIN D (ERGOCALCIFEROL) 1.25 MG (50000 UNIT) PO CAPS: 50000.0000 [IU] | ORAL_CAPSULE | ORAL | 0 refills | Status: DC

## 2022-07-04 MED ORDER — WARFARIN SODIUM 5 MG PO TABS: 7.5000 mg | ORAL_TABLET | Freq: Once | ORAL | Status: DC

## 2022-07-04 NOTE — Discharge Summary (Signed)
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  On presentation, he was hemodynamically stable.  Lab work showed creatinine of 1.7.  Mildly elevated LFTs.  Lipase was elevated in the range of 3000's.  UA was suspicious for UTI.  CT abdomen/pelvis showed prominent intrahepatic bile duct dilatation.  MRCP was done showing  pancreatitis.Patient was admitted for the management of idiopathic pancreatitis.  GI consulted.  Hospital course remarkable for elevated liver enzymes, which is progressively improving.  General surgery also consulted during this hospitalization, recommend outpatient follow-up for consideration of cholecystectomy.  Medically stable for discharge today.  Following problems were addressed during his hospitalization:  Pancreatitis/elevated lipase: Unclear etiology.  Patient does not consume alcohol.  Triglyceride level normal.  No contributing new medication that could cause pancreatitis.  When he presented he complained of lower abdominal pain instead of epigastric pain. Lipase level has normalised.  But liver enzymes started going  up.  He denies any abdominal pain today.  HIDA scan does not show any features of cholecystitis, patent cystic duct Diet advanced to low-fat diet.  Tolerating diet.  Liver enzymes improving.   Dilated biliary ducts: Imaging as above.  GI consulted.   Bilirubin almost normal.   Normal CBD.  Distended gallbladder with sludge without signs of inflammation.  HIDA scan as above.  We also recommend to follow-up with hepatobiliary surgery as an outpatient.  General surgery team aware and follow-up will be arranged   Severe systolic congestive/diastolic heart failure: Follows with cardiology.  Echo done here showed EF of 20 to 25%.  Currently appears euvolemic   History of coronary artery disease/elevated troponin: Elevated troponin most likely from demand ischemia from severe CHF.  He does not have any chest pain.  Troponins are flat trend He is on carvedilol, losartan, statin at home   Suspicion for UTI: Started on ceftriaxone.  UA showed some bacteria, leukocytes.  He was complaining of lower abdominal discomfort/burning while passing urine.  Urine culture showed E. coli.  Antibiotics changed to oral   History of CVA: On warfarin.  Has residual mild left-sided weakness.  Ambulates with the help of cane.  On statin at home.     CKD stage 3a: On review of his previous lab works, his creatinine has fluctuated between 1-2.  Currently within range.   History of paroxysmal A-fib: Currently in normal sinus rhythm.  On warfarin for anticoagulation.    Hypertension: On  carvedilol, losartan at home, continue low-dose carvedilol here due to soft blood pressure.     Diabetes type 2: Resume home regimen  Hyperlipidemia: On Crestor.  Can resume as an outpatient after improvement in the liver enzymes.  Hypokalemia/hypomagnesemia: Supplemented and corrected   Low vitamin D: Started on supplementation      Discharge Diagnoses:  Principal Problem:   Biliary obstruction Active Problems:   Essential hypertension   Chronic systolic heart failure (HCC)   Diabetes ([place])   Dyslipidemia   BPH without urinary obstruction   Coronary artery disease   History  of CVA (cerebrovascular accident)   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   Spastic hemiparesis (HCC)   Pancreatitis    Suspected UTI   Acute pancreatitis    Discharge Instructions  Discharge Instructions     Diet - low sodium heart healthy   Complete by: As directed    Discharge instructions   Complete by: As directed    1)Please take prescribed medications as instructed 2)Follow up with your PCP in a week. do a CMP test during the follow-up to check your liver enzymes.You can resume your cholesterol medication after your liver function normalizes 3)You will be called by hepatobiliary surgery and gastroenterology for outpatient follow-up appointment. 4)Monitor your blood pressure   Increase activity slowly   Complete by: As directed       Allergies as of 07/04/2022       Reactions   Lipitor [atorvastatin] Nausea And Vomiting        Medication List     STOP taking these medications    rosuvastatin 20 MG tablet Commonly known as: CRESTOR       TAKE these medications    acetaminophen 325 MG tablet Commonly known as: TYLENOL Take 1-2 tablets (325-650 mg total) by mouth every 4 (four) hours as needed for mild pain.   blood glucose meter kit and supplies Kit Dispense based on patient and insurance preference. Use up to four times daily as directed. (FOR E11.9).   carvedilol 3.125 MG tablet Commonly known as: COREG Take 1 tablet (3.125 mg total) by mouth 2 (two) times daily with a meal. What changed:  medication strength how much to take how to take this when to take this additional instructions   cefdinir 300 MG capsule Commonly known as: OMNICEF Take 1 capsule (300 mg total) by mouth every 12 (twelve) hours for 2 days.   empagliflozin 10 MG Tabs tablet Commonly known as: Jardiance Take 1 tablet (10 mg total) by mouth daily before breakfast.   Lancets 30G Misc Use to check blood sugars daily   losartan 25 MG tablet Commonly known as: COZAAR Take 1 tablet (25 mg total) by mouth daily.   metFORMIN 1000 MG tablet Commonly known as: GLUCOPHAGE Take 1 tablet (1,000 mg  total) by mouth 2 (two) times daily with a meal.   potassium chloride 10 MEQ tablet Commonly known as: KLOR-CON M Take 1 tablet by mouth twice daily   Vitamin D (Ergocalciferol) 1.25 MG (50000 UNIT) Caps capsule Commonly known as: DRISDOL Take 1 capsule (50,000 Units total) by mouth every 7 (seven) days. Start taking on: July 10, 2022   warfarin 5 MG tablet Commonly known as: COUMADIN Take as directed. If you are unsure how to take this medication, talk to your nurse or doctor. Original instructions: TAKE 1 TO 1 & 1/2 (ONE & ONE-HALF) TABLETS BY MOUTH ONCE DAILY AS DIRECTED BY COUMADIN CLINIC What changed:  how much to take how to take this when to take this additional instructions        Follow-up Information     Dwan Bolt, MD. Go on 07/17/2022.   Specialty: General Surgery Why: Appointment to discuss possible surgery to remove gallbladder scheduled for 3:40 PM. Please arrive 30 min prior to appointment time. Please have ID and insurance information with you. Contact information: [place]. [address] [place] [address] [phone]                Allergies  Allergen Reactions  Lipitor [Atorvastatin] Nausea And Vomiting    Consultations: GI, general surgery   Procedures/Studies: NM Hepatobiliary Liver Func  Result Date: 07/03/2022 CLINICAL DATA:  Intrahepatic biliary ductal dilatation and central liver cysts. Increasing liver enzymes. Abdominal pain. Nausea and vomiting. EXAM: NUCLEAR MEDICINE HEPATOBILIARY IMAGING TECHNIQUE: Sequential images of the abdomen were obtained out to 60 minutes following intravenous administration of radiopharmaceutical. 3 mg morphine IV administered after 60 minutes, with additional 30 minutes of images obtained. RADIOPHARMACEUTICALS:  5.2 mCi Tc-13m Choletec IV COMPARISON:  06/30/2022 abdominal sonogram and MRI abdomen. 06/29/2022 CT abdomen/pelvis. FINDINGS: Prompt uptake and biliary excretion of activity by the  liver is seen. Gallbladder activity is visualized after IV morphine administration, consistent with patency of cystic duct. Biliary activity passes into small bowel, consistent with patent common bile duct. IMPRESSION: 1. Patent cystic duct demonstrated, which is not compatible with acute cholecystitis. 2. Patent common bile duct demonstrated. Electronically Signed   By: JIlona SorrelM.D.   On: 07/03/2022 13:04   UKoreaAbdomen Limited RUQ (LIVER/GB)  Result Date: 06/30/2022 CLINICAL DATA:  Pancreatitis. EXAM: ULTRASOUND ABDOMEN LIMITED RIGHT UPPER QUADRANT COMPARISON:  CT 06/29/2022, MRI 06/30/2022 FINDINGS: Gallbladder: Gallbladder is mildly distended to 5 cm. Gallbladder wall thickening to 3 mm. Dependent sludge within the lumen the gallbladder. No shadowing gallstones are present. Negative sonographic Murphy's sign. Common bile duct: Diameter: Normal diameter scratch the upper limits of normal at 6 mm. Liver: Anechoic cysts within the liver parenchyma. No biliary duct dilatation. Portal vein is patent on color Doppler imaging with normal direction of blood flow towards the liver. Other: None. IMPRESSION: 1. No echogenic gallstones.  Sludge within the gallbladder. 2. Mild gallbladder distension.  No pericholecystic fluid. 3. Negative sonographic Murphy's sign. 4. Common bile duct normal diameter. Electronically Signed   By: SSuzy BouchardM.D.   On: 06/30/2022 14:41   ECHOCARDIOGRAM COMPLETE  Result Date: 06/30/2022    ECHOCARDIOGRAM REPORT   Patient Name:   Karl NEVILLEDate of Exam: 06/30/2022 Medical Rec #:  [id number]    Height:       74.0 in Accession #:    [id number]   Weight:       150.0 lb Date of Birth:  [birth year]   BSA:          1.923 m Patient Age:    89years      BP:           106/58 mmHg Patient Gender: M             HR:           68 bpm. Exam Location:  Inpatient Procedure: 2D Echo, Cardiac Doppler, Color Doppler and Intracardiac            Opacification Agent Indications:    Congestive  Heart Failure I50.9  History:        Patient has prior history of Echocardiogram examinations, most                 recent 06/15/2018. CAD, TIA and Stroke, Signs/Symptoms:Dyspnea;                 Risk Factors:Diabetes and Hypertension.  Sonographer:    SBernadene PersonRDCS Referring Phys: [id number] 1. No definitive LV thrombus identified. Left ventricular ejection fraction, by estimation, is 20 to 25%. The left ventricle has severely decreased function. The left ventricle demonstrates global hypokinesis. The left ventricular internal cavity size was  moderately dilated. Left ventricular diastolic parameters are consistent with Grade I diastolic dysfunction (impaired relaxation).  2. Right ventricular systolic function is normal. The right ventricular size is normal. Tricuspid regurgitation signal is inadequate for assessing PA pressure.  3. The mitral valve is normal in structure. No evidence of mitral valve regurgitation.  4. The aortic valve is tricuspid. Aortic valve regurgitation is not visualized.  5. Aneurysm of the ascending aorta, measuring 42 mm.  6. The inferior vena cava is normal in size with greater than 50% respiratory variability, suggesting right atrial pressure of 3 mmHg. Comparison(s): No significant change from prior study. FINDINGS  Left Ventricle: No definitive LV thrombus identified. Left ventricular ejection fraction, by estimation, is 20 to 25%. The left ventricle has severely decreased function. The left ventricle demonstrates global hypokinesis. Definity contrast agent was given IV to delineate the left ventricular endocardial borders. The left ventricular internal cavity size was moderately dilated. There is no left ventricular hypertrophy. Left ventricular diastolic parameters are consistent with Grade I diastolic dysfunction (impaired relaxation). Right Ventricle: The right ventricular size is normal. No increase in right ventricular wall thickness. Right  ventricular systolic function is normal. Tricuspid regurgitation signal is inadequate for assessing PA pressure. Left Atrium: Left atrial size was normal in size. Right Atrium: Right atrial size was normal in size. Pericardium: There is no evidence of pericardial effusion. Mitral Valve: The mitral valve is normal in structure. No evidence of mitral valve regurgitation. Tricuspid Valve: The tricuspid valve is normal in structure. Tricuspid valve regurgitation is not demonstrated. Aortic Valve: The aortic valve is tricuspid. Aortic valve regurgitation is not visualized. Pulmonic Valve: The pulmonic valve was normal in structure. Pulmonic valve regurgitation is not visualized. Aorta: There is an aneurysm involving the ascending aorta measuring 42 mm. Venous: The inferior vena cava is normal in size with greater than 50% respiratory variability, suggesting right atrial pressure of 3 mmHg. IAS/Shunts: The interatrial septum was not well visualized.  LEFT VENTRICLE PLAX 2D LVIDd:         6.30 cm      Diastology LVIDs:         5.20 cm      LV e' medial:    6.00 cm/s LV PW:         1.00 cm      LV E/e' medial:  10.3 LV IVS:        1.00 cm      LV e' lateral:   9.68 cm/s LVOT diam:     2.50 cm      LV E/e' lateral: 6.4 LV SV:         114 LV SV Index:   59 LVOT Area:     4.91 cm  LV Volumes (MOD) LV vol d, MOD A2C: 158.0 ml LV vol d, MOD A4C: 247.0 ml LV vol s, MOD A2C: 99.8 ml LV vol s, MOD A4C: 191.0 ml LV SV MOD A2C:     58.2 ml LV SV MOD A4C:     247.0 ml LV SV MOD BP:      58.9 ml RIGHT VENTRICLE RV S prime:     10.90 cm/s TAPSE (M-mode): 2.4 cm LEFT ATRIUM           Index        RIGHT ATRIUM           Index LA diam:      4.00 cm 2.08 cm/m   RA Area:     17.90  cm LA Vol (A2C): 60.6 ml 31.51 ml/m  RA Volume:   48.30 ml  25.11 ml/m LA Vol (A4C): 57.0 ml 29.64 ml/m  AORTIC VALVE LVOT Vmax:   117.00 cm/s LVOT Vmean:  72.800 cm/s LVOT VTI:    0.232 m  AORTA Ao Root diam: 4.10 cm Ao Asc diam:  4.20 cm MITRAL VALVE MV  Area (PHT): 3.31 cm    SHUNTS MV Decel Time: 229 msec    Systemic VTI:  0.23 m MV E velocity: 61.70 cm/s  Systemic Diam: 2.50 cm MV A velocity: 88.30 cm/s MV E/A ratio:  0.70 Landscape architect signed by Phineas Inches Signature Date/Time: 06/30/2022/11:19:05 AM    Final    MR ABDOMEN MRCP W WO CONTAST  Result Date: 06/30/2022 CLINICAL DATA:  Acute pancreatitis EXAM: MRI ABDOMEN WITHOUT AND WITH CONTRAST (INCLUDING MRCP) TECHNIQUE: Multiplanar multisequence MR imaging of the abdomen was performed both before and after the administration of intravenous contrast. Heavily T2-weighted images of the biliary and pancreatic ducts were obtained, and three-dimensional MRCP images were rendered by post processing. CONTRAST:  12m GADAVIST GADOBUTROL 1 MMOL/ML IV SOLN COMPARISON:  CT abdomen/pelvis dated 06/29/2022 FINDINGS: Motion degraded images. Lower chest: Lung bases are clear. Hepatobiliary: Liver is notable for irregular central cysts which compress the biliary confluence, measuring up to 3.6 cm on the left (series 15/image 10) and 2.9 cm on the right (series 15/image 13), without enhancement or solid component. No delayed peribiliary enhancement to suggest cholangiocarcinoma on imaging. No hepatic steatosis. Distended gallbladder. Mild layering gallbladder sludge (series 7/image 50). No cholelithiasis. Associated mild intrahepatic duct dilatation, favored to be due to mass effect from the central cysts (described above). No extrahepatic ductal dilatation. Common duct measures 5 mm. No choledocholithiasis is seen. Pancreas: Mild peripancreatic fluid along the pancreatic uncinate process and pancreaticoduodenal groove (series 3/images 25-26), suggesting groove pancreatitis. No drainable fluid collection/walled-off necrosis. No ductal dilatation. Spleen:  Within normal limits. Adrenals/Urinary Tract:  Adrenal glands are within normal limits. Subcentimeter interpolar left renal cyst (series 3/image 21). Right  kidney is within normal limits. No hydronephrosis. Stomach/Bowel: Stomach is within normal limits. Visualized bowel is unremarkable. Vascular/Lymphatic:  No evidence of abdominal aortic aneurysm. No suspicious abdominal lymphadenopathy. Other:  Insert ascites Musculoskeletal: No focal osseous lesions. 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 No drainable fluid collection/walled-off necrosis. Mild intrahepatic ductal dilatation, favored to be due to mass effect from irregular central cysts compressing the biliary confluence. No findings to suggest cholangiocarcinoma on MR. No intrahepatic ductal dilatation. Common duct measures 5 mm. No choledocholithiasis is seen. Distended gallbladder with mild layering gallbladder sludge. No cholelithiasis or inflammatory changes. Electronically Signed   By: SJulian HyM.D.   On: 06/30/2022 00:40   CT Abdomen Pelvis W Contrast  Result Date: 06/29/2022 CLINICAL DATA:  Nausea, vomiting, right lower quadrant abdominal pain, mid abdominal pain, and weakness for 2 days. EXAM: CT ABDOMEN AND PELVIS WITH CONTRAST TECHNIQUE: Multidetector CT imaging of the abdomen and pelvis was performed using the standard protocol following bolus administration of intravenous contrast. RADIATION DOSE REDUCTION: This exam was performed according to the departmental dose-optimization program which includes automated exposure control, adjustment of the mA and/or kV according to patient size and/or use of iterative reconstruction technique. CONTRAST:  851mOMNIPAQUE IOHEXOL 300 MG/ML  SOLN COMPARISON:  Abdominal radiographs 06/15/2018 FINDINGS: Lower chest: Atelectasis in the lung bases. Moderate-sized esophageal hiatal hernia. Hepatobiliary: The gallbladder is distended. No discrete stones or wall thickening identified. Mild extra and moderate to severe intrahepatic  bile duct dilatation. Cystic dilatation of the central bile ducts in the right and left side, largest on the left measuring 4.2  cm diameter. No common duct stones are visualized. Findings could represent biliary obstruction, choledochocele, or possibly cystadenoma. Suggest follow-up with elective MRI for further evaluation. Pancreas: No pancreatic mass or infiltration. No pancreatic ductal dilatation. Spleen: Normal in size without focal abnormality. Adrenals/Urinary Tract: Adrenal glands are unremarkable. Kidneys are normal, without renal calculi, focal lesion, or hydronephrosis. Bladder is unremarkable. Stomach/Bowel: Stomach, small bowel, and colon are not abnormally distended. No wall thickening or inflammatory changes are appreciated. Appendix is normal. Vascular/Lymphatic: Aortic atherosclerosis. No enlarged abdominal or pelvic lymph nodes. Reproductive: Marked enlargement of the prostate gland, measuring 9.2 cm diameter. Other: No free air or free fluid in the abdomen. Minimal periumbilical hernia containing fat. Musculoskeletal: Degenerative changes.  No acute bony abnormalities. IMPRESSION: 1. Prominent intrahepatic bile duct dilatation with less pronounced extrahepatic bile duct dilatation, gallbladder distention, and central hepatic cystic changes. Differential diagnosis includes biliary obstruction, possibly cholangiocarcinoma or strictures, choledochocele, or possibly cystadenoma. Suggest follow-up with nonemergent MRI/MRCP for further evaluation. 2. No pancreatic mass or ductal dilatation identified. 3. Marked enlargement of the prostate gland measuring 9.2 cm in diameter. 4. Aortic atherosclerosis. 5. Moderate-sized esophageal hiatal hernia. Electronically Signed   By: Lucienne Capers M.D.   On: 06/29/2022 17:22      Subjective: Patient seen and examined at the bedside this morning.  Hemodynamically stable for discharge today.  I called and discussed with daughter Janett Billow on phone about discharge planning  Discharge Exam: Vitals:   07/03/22 2027 07/04/22 0521  BP: 115/69 111/74  Pulse: 75 65  Resp: 17 18  Temp:  99 F (37.2 C) 98 F (36.7 C)  SpO2: 98% 100%   Vitals:   07/03/22 1211 07/03/22 2027 07/04/22 0500 07/04/22 0521  BP: 120/76 115/69  111/74  Pulse: 76 75  65  Resp: '17 17  18  ' Temp: 98.4 F (36.9 C) 99 F (37.2 C)  98 F (36.7 C)  TempSrc: Oral Oral  Oral  SpO2: 98% 98%  100%  Weight:   76.4 kg   Height:        General: Pt is alert, awake, not in acute distress Cardiovascular: RRR, S1/S2 +, no rubs, no gallops Respiratory: CTA bilaterally, no wheezing, no rhonchi Abdominal: Soft, NT, ND, bowel sounds + Extremities: no edema, no cyanosis    The results of significant diagnostics from this hospitalization (including imaging, microbiology, ancillary and laboratory) are listed below for reference.     Microbiology: Recent Results (from the past 240 hour(s))  Urine Culture     Status: Abnormal   Collection Time: 06/29/22  7:16 PM   Specimen: Urine, Clean Catch  Result Value Ref Range Status   Specimen Description   Final    URINE, CLEAN CATCH Performed at [facility], [place] [address], [place], [place] [address]    Special Requests   Final    NONE Performed at [facility], [place] [address] [place], [place] [address]    Culture >=100,000 COLONIES/mL ESCHERICHIA COLI (A)  Final   Report Status 07/01/2022 FINAL  Final   Organism ID, Bacteria ESCHERICHIA COLI (A)  Final      Susceptibility   Escherichia coli - MIC*    AMPICILLIN <=2 SENSITIVE Sensitive     CEFAZOLIN <=4 SENSITIVE Sensitive     CEFEPIME <=0.12 SENSITIVE Sensitive     CEFTRIAXONE <=0.25 SENSITIVE Sensitive  CIPROFLOXACIN <=0.25 SENSITIVE Sensitive     GENTAMICIN <=1 SENSITIVE Sensitive     IMIPENEM <=0.25 SENSITIVE Sensitive     NITROFURANTOIN <=16 SENSITIVE Sensitive     TRIMETH/SULFA <=20 SENSITIVE Sensitive     AMPICILLIN/SULBACTAM <=2 SENSITIVE Sensitive     PIP/TAZO <=4 SENSITIVE Sensitive     * >=100,000 COLONIES/mL ESCHERICHIA COLI     Labs: BNP  (last 3 results) No results for input(s): "BNP" in the last 8760 hours. Basic Metabolic Panel: Recent Labs  Lab 06/30/22 0510 07/01/22 0546 07/01/22 1211 07/02/22 0532 07/03/22 0510 07/04/22 0429  NA 140 139 137 137 137 135  K 3.3* 4.6 4.5 4.5 4.8 4.4  CL 117* 111 108 107 106 104  CO2 16* 21* '23 24 24 24  ' GLUCOSE 90 109* 135* 120* 112* 119*  BUN '22 18 17 16 16 21  ' CREATININE 1.18 1.29* 1.48* 1.45* 1.31* 1.30*  CALCIUM 6.5* 8.7* 8.9 8.8* 9.5 9.1  MG 1.6*  --   --   --   --   --    Liver Function Tests: Recent Labs  Lab 07/01/22 0546 07/01/22 1211 07/02/22 0532 07/03/22 0510 07/04/22 0429  AST 278* 318* 322* 162* 85*  ALT 217* 253* 326* 274* 199*  ALKPHOS 238* 298* 364* 373* 357*  BILITOT 1.1 1.2 0.9 0.9 0.9  PROT 6.4* 7.1 7.1 7.3 7.2  ALBUMIN 2.6* 2.9* 2.9* 3.0* 2.9*   Recent Labs  Lab 06/29/22 1520 06/30/22 0510 06/30/22 0752 07/01/22 0546  LIPASE 3,579* 447* 330* 43   No results for input(s): "AMMONIA" in the last 168 hours. CBC: Recent Labs  Lab 06/29/22 1520 06/30/22 0537 07/01/22 0546 07/02/22 0532 07/03/22 0510  WBC 9.0 11.7* 7.5 7.4 7.2  NEUTROABS  --   --   --   --  5.6  HGB 13.2 11.6* 11.1* 11.7* 12.6*  HCT 42.7 37.7* 35.4* 38.1* 40.4  MCV 80.1 80.0 79.0* 80.0 78.8*  PLT 229 216 215 233 249   Cardiac Enzymes: No results for input(s): "CKTOTAL", "CKMB", "CKMBINDEX", "TROPONINI" in the last 168 hours. BNP: Invalid input(s): "POCBNP" CBG: Recent Labs  Lab 07/03/22 0723 07/03/22 1214 07/03/22 1642 07/03/22 2149 07/04/22 0801  GLUCAP 107* 105* 165* 160* 111*   D-Dimer No results for input(s): "DDIMER" in the last 72 hours. Hgb A1c No results for input(s): "HGBA1C" in the last 72 hours. Lipid Profile No results for input(s): "CHOL", "HDL", "LDLCALC", "TRIG", "CHOLHDL", "LDLDIRECT" in the last 72 hours. Thyroid function studies No results for input(s): "TSH", "T4TOTAL", "T3FREE", "THYROIDAB" in the last 72 hours.  Invalid input(s):  "FREET3" Anemia work up No results for input(s): "VITAMINB12", "FOLATE", "FERRITIN", "TIBC", "IRON", "RETICCTPCT" in the last 72 hours. Urinalysis    Component Value Date/Time   COLORURINE YELLOW 06/29/2022 1821   APPEARANCEUR CLOUDY (A) 06/29/2022 1821   LABSPEC 1.029 06/29/2022 1821   PHURINE 6.0 06/29/2022 1821   GLUCOSEU >=500 (A) 06/29/2022 1821   HGBUR MODERATE (A) 06/29/2022 1821   BILIRUBINUR NEGATIVE 06/29/2022 1821   BILIRUBINUR moderate 07/09/2014 1428   KETONESUR 20 (A) 06/29/2022 1821   PROTEINUR 30 (A) 06/29/2022 1821   UROBILINOGEN 0.2 08/07/2015 0854   NITRITE NEGATIVE 06/29/2022 1821   LEUKOCYTESUR LARGE (A) 06/29/2022 1821   Sepsis Labs Recent Labs  Lab 06/30/22 0537 07/01/22 0546 07/02/22 0532 07/03/22 0510  WBC 11.7* 7.5 7.4 7.2   Microbiology Recent Results (from the past 240 hour(s))  Urine Culture     Status: Abnormal   Collection Time:  06/29/22  7:16 PM   Specimen: Urine, Clean Catch  Result Value Ref Range Status   Specimen Description   Final    URINE, CLEAN CATCH Performed at [facility], [place] [address], [place], [place] [address]    Special Requests   Final    NONE Performed at [facility], [place] [address] [place], [place] [address]    Culture >=100,000 COLONIES/mL ESCHERICHIA COLI (A)  Final   Report Status 07/01/2022 FINAL  Final   Organism ID, Bacteria ESCHERICHIA COLI (A)  Final      Susceptibility   Escherichia coli - MIC*    AMPICILLIN <=2 SENSITIVE Sensitive     CEFAZOLIN <=4 SENSITIVE Sensitive     CEFEPIME <=0.12 SENSITIVE Sensitive     CEFTRIAXONE <=0.25 SENSITIVE Sensitive     CIPROFLOXACIN <=0.25 SENSITIVE Sensitive     GENTAMICIN <=1 SENSITIVE Sensitive     IMIPENEM <=0.25 SENSITIVE Sensitive     NITROFURANTOIN <=16 SENSITIVE Sensitive     TRIMETH/SULFA <=20 SENSITIVE Sensitive     AMPICILLIN/SULBACTAM <=2 SENSITIVE Sensitive     PIP/TAZO <=4 SENSITIVE Sensitive     * >=100,000  COLONIES/mL ESCHERICHIA COLI    Please note: You were cared for by a hospitalist during your hospital stay. Once you are discharged, your primary care physician will handle any further medical issues. Please note that NO REFILLS for any discharge medications will be authorized once you are discharged, as it is imperative that you return to your primary care physician (or establish a relationship with a primary care physician if you do not have one) for your post hospital discharge needs so that they can reassess your need for medications and monitor your lab values.    Time coordinating discharge: 40 minutes  SIGNED:   Shelly Coss, MD  [facility] 07/04/2022, 10:58 AM Pager [phone]  If 7PM-7AM, please contact night-coverage [email or web address] Password [email or web address]

## 2022-07-04 NOTE — Evaluation (Signed)
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  Clinical Impression  Pt admitted with above diagnosis. Pt ambulated 150' with RW, no loss of balance. Pt has some shakiness in standing, he reports this is baseline since CVA and he denies h/o falls in past 6 months. He is mobilizing well enough to DC home. At baseline he ambulates with a cane, I encouraged him to use RW for now at home for increased support.  Pt currently with functional limitations due to the deficits listed below (see PT Problem List). Pt will benefit from skilled PT to increase their independence and safety with mobility to allow discharge to the venue listed below.          Recommendations for follow up therapy are one component of a multi-disciplinary discharge planning process, led by the attending physician.  Recommendations may be updated based on patient status, additional functional criteria and insurance authorization.  Follow Up Recommendations No PT follow up      Assistance Recommended at Discharge Set up Supervision/Assistance  Patient can return home with the following  Assist for transportation;Assistance with cooking/housework;Help with stairs or ramp for entrance    Equipment Recommendations None recommended by PT  Recommendations for Other Services       Functional Status Assessment Patient has had a recent decline in their functional status and demonstrates the ability to make significant improvements in function in a reasonable and predictable amount of  time.     Precautions / Restrictions Precautions Precautions: None Precaution Comments: reports some unsteadiness at baseline 2* CVA but denies falls in past 6 months Restrictions Weight Bearing Restrictions: No      Mobility  Bed Mobility Overal bed mobility: Modified Independent             General bed mobility comments: HOB up, used rail    Transfers Overall transfer level: Modified independent Equipment used: Rolling walker (2 wheels) Transfers: Sit to/from Stand Sit to Stand: From elevated surface, Supervision           General transfer comment: mildly shaky upon standing, pt reports this is baseline since CVA    Ambulation/Gait Ambulation/Gait assistance: Supervision Gait Distance (Feet): 150 Feet Assistive device: Rolling walker (2 wheels) Gait Pattern/deviations: Step-through pattern, Decreased stride length, Trunk flexed Gait velocity: decr but WFL     General Gait Details: VCs posture and proximity to RW, mildly shaky while walking but no loss of balance, pt reports shakiness is baseline since CVA  Stairs            Wheelchair Mobility    Modified Rankin (Stroke Patients Only)       Balance Overall balance assessment: Needs assistance Sitting-balance support: Feet supported, No upper extremity supported Sitting balance-Leahy Scale: Good     Standing balance support: Bilateral upper extremity supported, Reliant on assistive device for balance Standing balance-Leahy Scale: Fair Standing balance comment: steady with RW walking                             Pertinent Vitals/Pain Pain Assessment Pain Assessment: No/denies pain  Home Living Family/patient expects to be discharged to:: Private residence Living Arrangements: Other relatives Available Help at Discharge: Family;Available 24 hours/day Type of Home: House Home Access: Stairs to enter Entrance Stairs-Rails: None Entrance Stairs-Number of Steps: 3   Home  Layout: One level Home Equipment: Cane - single point;Wheelchair - Forensic psychologist (2 wheels);Rollator (4 wheels) Additional Comments: daughter/granddaughter provide transportation, pt doesn't drive; lives with granddaughter who works from home    Prior Function Prior Level of Function : Independent/Modified Independent             Mobility Comments: walks with [facility] ADLs Comments: independent with bathing/dressing     Hand Dominance        Extremity/Trunk Assessment   Upper Extremity Assessment Upper Extremity Assessment: Defer to OT evaluation;LUE deficits/detail LUE Deficits / Details: reports difficulty with fine motor coordination since CVA    Lower Extremity Assessment Lower Extremity Assessment: Overall WFL for tasks assessed    Cervical / Trunk Assessment Cervical / Trunk Assessment: Kyphotic  Communication   Communication: No difficulties  Cognition Arousal/Alertness: Awake/alert Behavior During Therapy: WFL for tasks assessed/performed Overall Cognitive Status: Within Functional Limits for tasks assessed                                          General Comments      Exercises     Assessment/Plan    PT Assessment Patient needs continued PT services  PT Problem List Decreased activity tolerance;Decreased balance;Decreased mobility       PT Treatment Interventions Gait training;Therapeutic exercise;Patient/family education;Functional mobility training;Balance training;Therapeutic activities    PT Goals (Current goals can be found in the Care Plan section)  Acute Rehab PT Goals Patient Stated Goal: likes to be outside, fishing PT Goal Formulation: With patient Time For Goal Achievement: 07/18/22 Potential to Achieve Goals: Good    Frequency Min 3X/week     Co-evaluation               AM-PAC PT "6 Clicks" Mobility  Outcome Measure Help needed turning from your back to your side while in a flat bed without using  bedrails?: None Help needed moving from lying on your back to sitting on the side of a flat bed without using bedrails?: A Little Help needed moving to and from a bed to a chair (including a wheelchair)?: None Help needed standing up from a chair using your arms (e.g., wheelchair or bedside chair)?: None Help needed to walk in hospital room?: None Help needed climbing 3-5 steps with a railing? : A Little 6 Click Score: 22    End of Session Equipment Utilized During Treatment: Gait belt Activity Tolerance: Patient tolerated treatment well Patient left: in chair;with call bell/phone within reach Nurse Communication: Mobility status PT Visit Diagnosis: Difficulty in walking, not elsewhere classified (R26.2)    Time: 9371-6967 PT Time Calculation (min) (ACUTE ONLY): 18 min   Charges:   PT Evaluation $PT Eval Moderate Complexity: 1 Mod         Tamala Ser PT 07/04/2022  Acute Rehabilitation Services  Office [phone]

## 2022-07-04 NOTE — Progress Notes (Addendum)
ANTICOAGULATION CONSULT NOTE  Pharmacy Consult for warfarin Indication: hx stroke  Allergies  Allergen Reactions   Lipitor [Atorvastatin] Nausea And Vomiting    Patient Measurements: Height: 6\' 2"  (188 cm) Weight: 76.4 kg (168 lb 6.9 oz) IBW/kg (Calculated) : 82.2  Vital Signs: Temp: 98 F (36.7 C) (08/01 0521) Temp Source: Oral (08/01 0521) BP: 111/74 (08/01 0521) Pulse Rate: 65 (08/01 0521)  Labs: Recent Labs    07/02/22 0532 07/03/22 0510 07/04/22 0429  HGB 11.7* 12.6*  --   HCT 38.1* 40.4  --   PLT 233 249  --   LABPROT 28.1* 19.4* 16.5*  INR 2.7* 1.7* 1.3*  CREATININE 1.45* 1.31* 1.30*     Estimated Creatinine Clearance: 59.6 mL/min (A) (by C-G formula based on SCr of 1.3 mg/dL (H)).   Medications:  - PTA warfarin regimen: 5mg  daily except 7.5 mg on Mon and Fri (last dose taken at 8a on 06/29/22 PTA)  Assessment: Patient is a 66 y.o M with hx CVA on warfarin PTA admitted 7/27 for pancreatitis d/t biliary duct abnormality. Pharmacy has been consulted to continue warfarin. No bridging required per MD.  Today, 07/04/2022: INR remains subtherapeutic and lower today at 1.7 (doses held 7/28-30) CBC stable, AST/ALT elevated and increasing Documented eating 100% of meals, advancing diet DDI: recent use of broad-spectrum antibiotics can lead to an increase in INR  Goal of Therapy:  INR 2-3 Monitor platelets by anticoagulation protocol: Yes   Plan:  Warfarin 7.5 mg PO x1  For discharge, recommend 7.5 mg daily x 2 more doses, then resume PTA regimen (as above) Daily INR  Monitor for signs and symptoms of bleeding  09/03/2022, PharmD, BCPS [phone] 07/04/2022, 9:39 AM

## 2022-07-04 NOTE — Care Management Important Message (Signed)
Important Message  Patient Details IM Letter given to the Patient. Name: Karl [name] MRN: [id number] Date of Birth: 01-Oct-1956   Medicare Important Message Given:  Yes     [name] 07/04/2022, 10:17 AM

## 2022-07-05 ENCOUNTER — Telehealth: Payer: Self-pay | Admitting: *Deleted

## 2022-07-05 ENCOUNTER — Encounter: Payer: Self-pay | Admitting: *Deleted

## 2022-07-05 ENCOUNTER — Telehealth: Payer: Self-pay

## 2022-07-05 NOTE — Telephone Encounter (Signed)
Transition Care Management Unsuccessful Follow-up Telephone Call  Date of discharge and from where:  [name] 07/04/2022  Attempts:  1st Attempt  Reason for unsuccessful TCM follow-up call:  Unable to leave message

## 2022-07-05 NOTE — Patient Outreach (Signed)
  Care Coordination TOC Note Transition Care Management Unsuccessful Follow-up Telephone Call  Date of discharge and from where:  07/04/22- [facility]  Attempts:  1st Attempt  Reason for unsuccessful TCM follow-up call:  Unable to leave message- attempted calls to both numbers on fie for patient; received automated voice messages on both numbers, stating "I'm sorry your call cannot be completed at this time;" no option for leaving voice message requesting call back; call spontaneously ended  Caryl Pina, RN, BSN, CCRN Alumnus RN [facility] Care Coordination- [facility] Care Management [phone]: direct office

## 2022-07-06 ENCOUNTER — Encounter: Payer: Self-pay | Admitting: *Deleted

## 2022-07-06 ENCOUNTER — Telehealth: Payer: Self-pay | Admitting: *Deleted

## 2022-07-06 NOTE — Patient Outreach (Signed)
  Care Coordination [facility] Note Transition Care Management Follow-up Telephone Call Date of discharge and from where: 06/29/22 [name] How have you been since you were released from the hospital? Per caregiver/ daughter [name]: "He is doing great; he has been fine; still weak but good" Any questions or concerns? No  Items Reviewed: Did the pt receive and understand the discharge instructions provided? Yes  Medications obtained and verified? Yes  Other?  N/A Any new allergies since your discharge? No  Dietary orders reviewed? Yes Do you have support at home? Yes   Home Care and Equipment/Supplies: Were home health services ordered? no If so, what is the name of the agency? N/A  Has the agency set up a time to come to the patient's home? not applicable Were any new equipment or medical supplies ordered?  No What is the name of the medical supply agency? N/A Were you able to get the supplies/equipment? not applicable Do you have any questions related to the use of the equipment or supplies? No N/A  Functional Questionnaire: (I = Independent and D = Dependent) ADLs: I daughter assisting as needed  Bathing/Dressing- I  Meal Prep- I daughter assisting as needed  Eating- I  Maintaining continence- I  Transferring/Ambulation- I using cane and walker as needed  Managing Meds- I daughter assisting as needed  Follow up appointments reviewed:  PCP Hospital f/u appt confirmed? No   Specialist Hospital f/u appt confirmed? Yes  Scheduled to see [facility]- Dr. Freida Busman on 07/17/22 @ 3:40 pm Are transportation arrangements needed? No  If their condition worsens, is the pt aware to call PCP or go to the Emergency Dept.? Yes Was the patient provided with contact information for the PCP's office or ED? Yes Was to pt encouraged to call back with questions or concerns? Yes  SDOH assessments and interventions completed:   Yes  Care Coordination Interventions Activated:  Yes   Care Coordination  Interventions:  PCP follow up appointment requested   Provided date and time/ location of scheduled surgical specialist office visit- daughter was unaware of this appointment  Encounter Outcome:  Pt. Visit Completed    Caryl Pina, RN, BSN, CCRN Alumnus RN [facility] Care Coordination- [facility] Care Management [phone]: direct office

## 2022-07-06 NOTE — Patient Outreach (Signed)
  Care Coordination [facility] Note Transition Care Management Unsuccessful Follow-up Telephone Call  Date of discharge and from where:  06/29/22 [name]  Attempts:  2nd Attempt  Reason for unsuccessful TCM follow-up call:  Left voice message  Caryl Pina, RN, BSN, CCRN Alumnus RN [facility] Care Coordination- [facility] Care Management [phone]: direct office

## 2022-07-17 DIAGNOSIS — K835 Biliary cyst: Secondary | ICD-10-CM | POA: Diagnosis not present

## 2022-07-26 DIAGNOSIS — D376 Neoplasm of uncertain behavior of liver, gallbladder and bile ducts: Secondary | ICD-10-CM | POA: Diagnosis not present

## 2022-07-26 DIAGNOSIS — K835 Biliary cyst: Secondary | ICD-10-CM | POA: Diagnosis not present

## 2022-07-27 ENCOUNTER — Other Ambulatory Visit: Payer: Self-pay | Admitting: Internal Medicine

## 2022-08-10 ENCOUNTER — Other Ambulatory Visit: Payer: Self-pay | Admitting: Internal Medicine

## 2022-08-13 ENCOUNTER — Other Ambulatory Visit: Payer: Self-pay | Admitting: Internal Medicine

## 2022-08-28 ENCOUNTER — Ambulatory Visit: Payer: Medicare Other | Attending: Internal Medicine | Admitting: *Deleted

## 2022-08-28 DIAGNOSIS — Z5181 Encounter for therapeutic drug level monitoring: Secondary | ICD-10-CM | POA: Diagnosis not present

## 2022-08-28 DIAGNOSIS — G459 Transient cerebral ischemic attack, unspecified: Secondary | ICD-10-CM | POA: Insufficient documentation

## 2022-08-28 LAB — POCT INR: INR: 1.5 — AB (ref 2.0–3.0)

## 2022-08-28 NOTE — Patient Instructions (Signed)
Description   Today take another 1/2 tablet and tomorrow take 1.5 tablets then continue taking Warfarin 1 tablet daily except 1.5 tablets on Mondays and Fridays. Recheck INR in 1 week. Call the Coumadin clinic with any questions [phone].

## 2022-09-04 ENCOUNTER — Ambulatory Visit: Payer: Medicare Other | Attending: Internal Medicine | Admitting: *Deleted

## 2022-09-04 DIAGNOSIS — G459 Transient cerebral ischemic attack, unspecified: Secondary | ICD-10-CM

## 2022-09-04 DIAGNOSIS — Z5181 Encounter for therapeutic drug level monitoring: Secondary | ICD-10-CM

## 2022-09-04 LAB — POCT INR: INR: 1.7 — AB (ref 2.0–3.0)

## 2022-09-04 NOTE — Patient Instructions (Signed)
Description   Today take another 1/2 tablet and then start taking Warfarin 1 tablet daily except 1.5 tablets on Mondays, Wednesdays, and Fridays. Recheck INR in 2 weeks. Call the Coumadin clinic with any questions [phone].

## 2022-09-11 ENCOUNTER — Other Ambulatory Visit: Payer: Self-pay | Admitting: Internal Medicine

## 2022-09-11 DIAGNOSIS — Z7901 Long term (current) use of anticoagulants: Secondary | ICD-10-CM

## 2022-09-11 DIAGNOSIS — G459 Transient cerebral ischemic attack, unspecified: Secondary | ICD-10-CM

## 2022-09-11 NOTE — Telephone Encounter (Signed)
Refill request for warfarin:  Last INR was 1.7 on 09/04/22 Next INR due on 09/18/22 LOV was 03/25/22  Beckie Salts MD  Pt past due for appt with Dr Lovena Le.  Message sent to schedulers to make appt.  Refill approved x 1.

## 2022-09-12 ENCOUNTER — Telehealth: Payer: Self-pay | Admitting: Internal Medicine

## 2022-09-12 ENCOUNTER — Other Ambulatory Visit: Payer: Self-pay

## 2022-09-12 MED ORDER — LOSARTAN POTASSIUM 25 MG PO TABS: ORAL_TABLET | ORAL | 2 refills | Status: DC

## 2022-09-12 MED ORDER — CARVEDILOL 3.125 MG PO TABS: 3.1250 mg | ORAL_TABLET | Freq: Two times a day (BID) | ORAL | 2 refills | Status: DC

## 2022-09-12 NOTE — Telephone Encounter (Signed)
Patient needs his carvedilol and his losartan refilled - Please send to [facility] on [facility], [place]

## 2022-09-12 NOTE — Telephone Encounter (Signed)
Scripts sent in today °

## 2022-09-16 ENCOUNTER — Other Ambulatory Visit: Payer: Self-pay | Admitting: Internal Medicine

## 2022-09-18 ENCOUNTER — Ambulatory Visit: Payer: Medicare Other

## 2022-09-25 ENCOUNTER — Ambulatory Visit: Payer: Medicare Other | Attending: Cardiology

## 2022-09-25 DIAGNOSIS — G459 Transient cerebral ischemic attack, unspecified: Secondary | ICD-10-CM | POA: Diagnosis not present

## 2022-09-25 DIAGNOSIS — Z5181 Encounter for therapeutic drug level monitoring: Secondary | ICD-10-CM | POA: Diagnosis not present

## 2022-09-25 LAB — POCT INR
INR: 1.8 — AB (ref 2.0–3.0)
INR: 2 (ref 2.0–3.0)

## 2022-09-25 NOTE — Patient Instructions (Signed)
Today take another 1/2 tablet and then continue taking Warfarin 1 tablet daily except 1.5 tablets on Mondays, Wednesdays, and Fridays. Recheck INR in 2 weeks. Call the Coumadin clinic with any questions [phone].

## 2022-10-01 ENCOUNTER — Encounter: Payer: Self-pay | Admitting: Internal Medicine

## 2022-10-01 NOTE — Patient Instructions (Addendum)
      Blood work was ordered.   The lab is on the first floor.    Medications changes include :   none     Return in about 6 months (around 04/03/2023) for follow up.  

## 2022-10-01 NOTE — Progress Notes (Unsigned)
Subjective:    Patient ID: Karl [name], male    DOB: 1956/04/07, 66 y.o.   MRN: [id number]     HPI Karl [name] is here for follow up of his chronic medical problems, including CAD, HFrEF, htn, DM, hld, ho CVA w/ left sided weakness on warfarin  Since he was here last he was hospitalized for pancreatitis in July-cause unknown.  Crestor was temporarily held and he was told he could restart once liver enzymes normalized.  Psa elevated in 2021 - referred to uro - he states he saw them - not seeing them now.     Medications and allergies reviewed with patient and updated if appropriate.  Current Outpatient Medications on File Prior to Visit  Medication Sig Dispense Refill   acetaminophen (TYLENOL) 325 MG tablet Take 1-2 tablets (325-650 mg total) by mouth every 4 (four) hours as needed for mild pain.     blood glucose meter kit and supplies KIT Dispense based on patient and insurance preference. Use up to four times daily as directed. (FOR E11.9). 1 each 0   Lancets 30G MISC Use to check blood sugars daily 100 each 3   losartan (COZAAR) 25 MG tablet TAKE 1 TABLET BY MOUTH ONCE DAILY. FOLLOW UP APPT DUE IN OCT MUST SEE PROVIDER FOR FUTURE REFILLS 90 tablet 2   potassium chloride (KLOR-CON M) 10 MEQ tablet Take 1 tablet by mouth twice daily 180 tablet 0   warfarin (COUMADIN) 5 MG tablet TAKE 1 TO 1 & 1/2 (ONE & ONE-HALF) TABLETS BY MOUTH ONCE DAILY AS DIRECTED BY COUMADIN CLINIC 60 tablet 1   No current facility-administered medications on file prior to visit.     Review of Systems  Constitutional:  Negative for fever.  Respiratory:  Positive for cough (mild - URI). Negative for shortness of breath and wheezing.   Cardiovascular:  Negative for chest pain, palpitations and leg swelling.  Gastrointestinal:  Negative for abdominal pain, constipation, diarrhea and nausea.       No gerd  Neurological:  Negative for light-headedness and headaches.       Objective:   Vitals:    10/02/22 1119  BP: 134/60  Pulse: (!) 58  Temp: 98.2 F (36.8 C)  SpO2: 99%   BP Readings from Last 3 Encounters:  10/02/22 134/60  07/04/22 111/74  03/27/22 140/70   Wt Readings from Last 3 Encounters:  10/02/22 166 lb (75.3 kg)  07/04/22 168 lb 6.9 oz (76.4 kg)  03/27/22 176 lb (79.8 kg)   Body mass index is 21.31 kg/m.    Physical Exam Constitutional:      General: He is not in acute distress.    Appearance: Normal appearance. He is not ill-appearing.  HENT:     Head: Normocephalic and atraumatic.  Eyes:     Conjunctiva/sclera: Conjunctivae normal.  Cardiovascular:     Rate and Rhythm: Normal rate and regular rhythm.     Heart sounds: Normal heart sounds. No murmur heard. Pulmonary:     Effort: Pulmonary effort is normal. No respiratory distress.     Breath sounds: Normal breath sounds. No wheezing or rales.  Abdominal:     General: There is no distension.     Palpations: Abdomen is soft.     Tenderness: There is no abdominal tenderness.  Musculoskeletal:     Right lower leg: No edema.     Left lower leg: No edema.  Skin:    General: Skin is warm and  dry.     Findings: No rash.  Neurological:     Mental Status: He is alert. Mental status is at baseline.  Psychiatric:        Mood and Affect: Mood normal.        Lab Results  Component Value Date   WBC 7.2 07/03/2022   HGB 12.6 (L) 07/03/2022   HCT 40.4 07/03/2022   PLT 249 07/03/2022   GLUCOSE 119 (H) 07/04/2022   CHOL 124 03/27/2022   TRIG 93 06/30/2022   HDL 46.20 03/27/2022   LDLDIRECT 135.3 06/22/2008   LDLCALC 61 03/27/2022   ALT 199 (H) 07/04/2022   AST 85 (H) 07/04/2022   NA 135 07/04/2022   K 4.4 07/04/2022   CL 104 07/04/2022   CREATININE 1.30 (H) 07/04/2022   BUN 21 07/04/2022   CO2 24 07/04/2022   TSH 0.75 06/24/2019   PSA 6.63 (H) 07/06/2020   INR 2.0 09/25/2022   HGBA1C 6.9 (H) 03/27/2022   MICROALBUR 7.0 (H) 03/27/2022     Assessment & Plan:    See Problem List for  Assessment and Plan of chronic medical problems.

## 2022-10-02 ENCOUNTER — Ambulatory Visit (INDEPENDENT_AMBULATORY_CARE_PROVIDER_SITE_OTHER): Payer: Medicare Other | Admitting: Internal Medicine

## 2022-10-02 VITALS — BP 134/60 | HR 58 | Temp 98.2°F | Ht 74.0 in | Wt 166.0 lb

## 2022-10-02 DIAGNOSIS — E1159 Type 2 diabetes mellitus with other circulatory complications: Secondary | ICD-10-CM | POA: Diagnosis not present

## 2022-10-02 DIAGNOSIS — R531 Weakness: Secondary | ICD-10-CM | POA: Diagnosis not present

## 2022-10-02 DIAGNOSIS — E785 Hyperlipidemia, unspecified: Secondary | ICD-10-CM

## 2022-10-02 DIAGNOSIS — I10 Essential (primary) hypertension: Secondary | ICD-10-CM

## 2022-10-02 DIAGNOSIS — Z125 Encounter for screening for malignant neoplasm of prostate: Secondary | ICD-10-CM | POA: Diagnosis not present

## 2022-10-02 DIAGNOSIS — I5022 Chronic systolic (congestive) heart failure: Secondary | ICD-10-CM

## 2022-10-02 DIAGNOSIS — I69998 Other sequelae following unspecified cerebrovascular disease: Secondary | ICD-10-CM | POA: Diagnosis not present

## 2022-10-02 DIAGNOSIS — I2510 Atherosclerotic heart disease of native coronary artery without angina pectoris: Secondary | ICD-10-CM | POA: Diagnosis not present

## 2022-10-02 LAB — CBC WITH DIFFERENTIAL/PLATELET
Basophils Absolute: 0 10*3/uL (ref 0.0–0.1)
Basophils Relative: 0.9 % (ref 0.0–3.0)
Eosinophils Absolute: 0.2 10*3/uL (ref 0.0–0.7)
Eosinophils Relative: 4.9 % (ref 0.0–5.0)
HCT: 41.4 % (ref 39.0–52.0)
Hemoglobin: 12.9 g/dL — ABNORMAL LOW (ref 13.0–17.0)
Lymphocytes Relative: 24.8 % (ref 12.0–46.0)
Lymphs Abs: 0.9 10*3/uL (ref 0.7–4.0)
MCHC: 31.1 g/dL (ref 30.0–36.0)
MCV: 78.2 fl (ref 78.0–100.0)
Monocytes Absolute: 0.5 10*3/uL (ref 0.1–1.0)
Monocytes Relative: 13.5 % — ABNORMAL HIGH (ref 3.0–12.0)
Neutro Abs: 2.1 10*3/uL (ref 1.4–7.7)
Neutrophils Relative %: 55.9 % (ref 43.0–77.0)
Platelets: 234 10*3/uL (ref 150.0–400.0)
RBC: 5.3 Mil/uL (ref 4.22–5.81)
RDW: 17.6 % — ABNORMAL HIGH (ref 11.5–15.5)
WBC: 3.7 10*3/uL — ABNORMAL LOW (ref 4.0–10.5)

## 2022-10-02 LAB — COMPREHENSIVE METABOLIC PANEL
ALT: 9 U/L (ref 0–53)
AST: 14 U/L (ref 0–37)
Albumin: 3.9 g/dL (ref 3.5–5.2)
Alkaline Phosphatase: 68 U/L (ref 39–117)
BUN: 17 mg/dL (ref 6–23)
CO2: 27 mEq/L (ref 19–32)
Calcium: 9.4 mg/dL (ref 8.4–10.5)
Chloride: 104 mEq/L (ref 96–112)
Creatinine, Ser: 1.61 mg/dL — ABNORMAL HIGH (ref 0.40–1.50)
GFR: 43.82 mL/min — ABNORMAL LOW (ref >60.00)
Glucose, Bld: 102 mg/dL — ABNORMAL HIGH (ref 70–99)
Potassium: 4.5 mEq/L (ref 3.5–5.1)
Sodium: 137 mEq/L (ref 135–145)
Total Bilirubin: 0.5 mg/dL (ref 0.2–1.2)
Total Protein: 6.9 g/dL (ref 6.0–8.3)

## 2022-10-02 LAB — LIPID PANEL
Cholesterol: 252 mg/dL — ABNORMAL HIGH (ref 0–200)
HDL: 57.8 mg/dL (ref >39.00)
LDL Cholesterol: 176 mg/dL — ABNORMAL HIGH (ref 0–99)
NonHDL: 194.49
Total CHOL/HDL Ratio: 4
Triglycerides: 93 mg/dL (ref 0.0–149.0)
VLDL: 18.6 mg/dL (ref 0.0–40.0)

## 2022-10-02 LAB — HEMOGLOBIN A1C: Hgb A1c MFr Bld: 6.4 % (ref 4.6–6.5)

## 2022-10-02 LAB — PSA, MEDICARE: PSA: 8.99 ng/ml — ABNORMAL HIGH (ref 0.10–4.00)

## 2022-10-02 MED ORDER — CARVEDILOL 3.125 MG PO TABS: 3.1250 mg | ORAL_TABLET | Freq: Two times a day (BID) | ORAL | 2 refills | Status: DC

## 2022-10-02 MED ORDER — EMPAGLIFLOZIN 10 MG PO TABS: 10.0000 mg | ORAL_TABLET | Freq: Every day | ORAL | 2 refills | Status: DC

## 2022-10-02 MED ORDER — METFORMIN HCL 1000 MG PO TABS: ORAL_TABLET | ORAL | 2 refills | Status: DC

## 2022-10-02 NOTE — Assessment & Plan Note (Addendum)
Chronic No symptoms c/w angina Continue coreg 3.125 mg bid, losartan 25 mg daily, warfarin Restart statin if LFTs are normal

## 2022-10-02 NOTE — Assessment & Plan Note (Signed)
Chronic  Lab Results  Component Value Date   HGBA1C 6.9 (H) 03/27/2022   Sugars controlled Check A1c Continue metformin 1000 mg twice daily, Jardiance 10 mg daily Stressed regular exercise, diabetic diet

## 2022-10-02 NOTE — Assessment & Plan Note (Signed)
Chronic BP well controlled Continue coreg 3.125 mg bid, losartan 25 mg  cmp

## 2022-10-02 NOTE — Assessment & Plan Note (Signed)
Chronic Was taking rosuvastatin prior to his hospitalization over the summer-this medication was stopped at that point and it was advised that he could restart once his liver tests became normal, but he has not been seen since then CMP today As long as liver tests are normal will restart rosuvastatin

## 2022-10-02 NOTE — Assessment & Plan Note (Signed)
Chronic Follow with cardiology Appears euvolemic Continue carvedilol 12.5 3.25 mg twice daily, Jardiance 10 mg daily, Losartan 25 mg daily

## 2022-10-02 NOTE — Assessment & Plan Note (Addendum)
Chronic On warfarin, Jardiance 10 mg daily Blood pressure controlled Sugars controlled Restart statin if LFTs are normal

## 2022-10-03 ENCOUNTER — Other Ambulatory Visit: Payer: Self-pay | Admitting: Internal Medicine

## 2022-10-03 MED ORDER — ROSUVASTATIN CALCIUM 20 MG PO TABS: 20.0000 mg | ORAL_TABLET | Freq: Every day | ORAL | 2 refills | Status: DC

## 2022-10-05 ENCOUNTER — Other Ambulatory Visit: Payer: Self-pay | Admitting: Surgery

## 2022-10-05 DIAGNOSIS — K835 Biliary cyst: Secondary | ICD-10-CM

## 2022-10-09 ENCOUNTER — Ambulatory Visit: Payer: Medicare Other | Attending: Internal Medicine

## 2022-10-09 DIAGNOSIS — Z5181 Encounter for therapeutic drug level monitoring: Secondary | ICD-10-CM

## 2022-10-09 DIAGNOSIS — Z7901 Long term (current) use of anticoagulants: Secondary | ICD-10-CM

## 2022-10-09 DIAGNOSIS — G459 Transient cerebral ischemic attack, unspecified: Secondary | ICD-10-CM

## 2022-10-09 LAB — POCT INR: INR: 2.3 (ref 2.0–3.0)

## 2022-10-09 MED ORDER — WARFARIN SODIUM 5 MG PO TABS: ORAL_TABLET | ORAL | 1 refills | Status: DC

## 2022-10-09 NOTE — Patient Instructions (Signed)
continue taking Warfarin 1 tablet daily except 1.5 tablets on Mondays, Wednesdays, and Fridays. Recheck INR in 4 weeks. Call the Coumadin clinic with any questions [phone].

## 2022-11-06 ENCOUNTER — Ambulatory Visit: Payer: Medicare Other | Attending: Internal Medicine

## 2022-11-06 DIAGNOSIS — G459 Transient cerebral ischemic attack, unspecified: Secondary | ICD-10-CM

## 2022-11-06 DIAGNOSIS — Z5181 Encounter for therapeutic drug level monitoring: Secondary | ICD-10-CM

## 2022-11-06 LAB — POCT INR: INR: 1.7 — AB (ref 2.0–3.0)

## 2022-11-06 NOTE — Patient Instructions (Addendum)
Description   Take an extra 1/2 tablet today and then START taking Warfarin 1.5 tablet daily except 1 tablets on Tuesdays, Thursdays, and Saturdays.  Stay consistent with green each week.  Recheck INR in 2 weeks.  Call the Coumadin clinic with any questions #[phone].

## 2022-11-20 ENCOUNTER — Ambulatory Visit: Payer: Medicare Other | Attending: Cardiology

## 2022-11-20 DIAGNOSIS — G459 Transient cerebral ischemic attack, unspecified: Secondary | ICD-10-CM | POA: Diagnosis not present

## 2022-11-20 DIAGNOSIS — Z5181 Encounter for therapeutic drug level monitoring: Secondary | ICD-10-CM | POA: Diagnosis not present

## 2022-11-20 LAB — POCT INR: INR: 2.3 (ref 2.0–3.0)

## 2022-11-20 NOTE — Patient Instructions (Signed)
Continue taking Warfarin 1.5 tablet daily except 1 tablet on Tuesdays, Thursdays, and Saturdays.  Stay consistent with green each week.  Recheck INR in 4 weeks.  Call the Coumadin clinic with any questions #[phone].

## 2022-12-16 ENCOUNTER — Other Ambulatory Visit: Payer: Self-pay | Admitting: Internal Medicine

## 2022-12-16 DIAGNOSIS — G459 Transient cerebral ischemic attack, unspecified: Secondary | ICD-10-CM

## 2022-12-16 DIAGNOSIS — Z7901 Long term (current) use of anticoagulants: Secondary | ICD-10-CM

## 2022-12-18 ENCOUNTER — Ambulatory Visit: Payer: Medicare Other

## 2022-12-20 ENCOUNTER — Ambulatory Visit: Payer: Medicare Other

## 2022-12-26 ENCOUNTER — Ambulatory Visit: Payer: Medicare Other

## 2022-12-28 ENCOUNTER — Ambulatory Visit: Payer: Medicare Other | Attending: Internal Medicine | Admitting: *Deleted

## 2022-12-28 DIAGNOSIS — Z5181 Encounter for therapeutic drug level monitoring: Secondary | ICD-10-CM | POA: Diagnosis not present

## 2022-12-28 DIAGNOSIS — G459 Transient cerebral ischemic attack, unspecified: Secondary | ICD-10-CM

## 2022-12-28 LAB — POCT INR: INR: 1.9 — AB (ref 2.0–3.0)

## 2022-12-28 NOTE — Patient Instructions (Signed)
Description   Today take 1.5 tablets then continue taking Warfarin 1.5 tablet daily except 1 tablet on Tuesdays, Thursdays, and Saturdays. Stay consistent with green each week.  Recheck INR in 4 weeks.  Call the Coumadin clinic with any questions [phone].    Please call [phone] for an appointment.

## 2023-01-04 ENCOUNTER — Other Ambulatory Visit: Payer: Self-pay | Admitting: Internal Medicine

## 2023-01-25 ENCOUNTER — Ambulatory Visit: Payer: Medicare Other | Attending: Cardiology | Admitting: *Deleted

## 2023-01-25 DIAGNOSIS — G459 Transient cerebral ischemic attack, unspecified: Secondary | ICD-10-CM | POA: Diagnosis not present

## 2023-01-25 DIAGNOSIS — Z5181 Encounter for therapeutic drug level monitoring: Secondary | ICD-10-CM | POA: Diagnosis not present

## 2023-01-25 LAB — POCT INR: INR: 2.9 (ref 2.0–3.0)

## 2023-01-25 NOTE — Patient Instructions (Addendum)
Description   Continue taking the dose you have been taking, which is, warfarin 1 tablet daily except 1.5 tablets on Monday, Wednesday, and Friday. Stay consistent with green each week. Recheck INR in 5 weeks.  Call the Coumadin clinic with any questions [phone].

## 2023-03-01 ENCOUNTER — Ambulatory Visit: Payer: Medicare Other

## 2023-03-05 ENCOUNTER — Ambulatory Visit: Payer: Medicare Other | Attending: Internal Medicine | Admitting: Pharmacist

## 2023-03-05 DIAGNOSIS — G459 Transient cerebral ischemic attack, unspecified: Secondary | ICD-10-CM | POA: Insufficient documentation

## 2023-03-05 DIAGNOSIS — Z5181 Encounter for therapeutic drug level monitoring: Secondary | ICD-10-CM | POA: Insufficient documentation

## 2023-03-05 LAB — POCT INR: POC INR: 2.3

## 2023-03-05 NOTE — Patient Instructions (Signed)
Continue taking the dose you have been taking, which is, warfarin 1 tablet daily except 1.5 tablets on Monday, Wednesday, and Friday. Stay consistent with green each week. Recheck INR in 5 weeks.  Call the Coumadin clinic with any questions [phone].

## 2023-03-26 ENCOUNTER — Telehealth: Payer: Self-pay | Admitting: Internal Medicine

## 2023-03-26 NOTE — Telephone Encounter (Signed)
Called patient to schedule Medicare Annual Wellness Visit (AWV). Left message for patient to call back and schedule Medicare Annual Wellness Visit (AWV).  AWV-I: 03/26/2023  Please schedule an appointment at any time with [facility] .  If any questions, please contact me at [phone].  Thank you ,  Randon Goldsmith Care Guide [facility] AWV TEAM Direct Dial: [phone]

## 2023-03-28 ENCOUNTER — Other Ambulatory Visit: Payer: Self-pay | Admitting: Internal Medicine

## 2023-03-28 DIAGNOSIS — Z7901 Long term (current) use of anticoagulants: Secondary | ICD-10-CM

## 2023-03-28 DIAGNOSIS — G459 Transient cerebral ischemic attack, unspecified: Secondary | ICD-10-CM

## 2023-03-29 NOTE — Telephone Encounter (Signed)
Warfarin  Refill CVA, AFIB Last INR 03/05/23 Last OV 03/25/21-NEEDS OV, WILL PLACE NOTE ON NEXT ANTICOAG APPT

## 2023-04-01 ENCOUNTER — Encounter: Payer: Self-pay | Admitting: Internal Medicine

## 2023-04-01 NOTE — Progress Notes (Unsigned)
Subjective:    Patient ID: Karl [name], male    DOB: [birth year], 68 y.o.   MRN: [id number]     HPI Karl [name] is here for follow up of his chronic medical problems.  Eating well, exercising.   He is taking his medications.   Medications and allergies reviewed with patient and updated if appropriate.  Current Outpatient Medications on File Prior to Visit  Medication Sig Dispense Refill   acetaminophen (TYLENOL) 325 MG tablet Take 1-2 tablets (325-650 mg total) by mouth every 4 (four) hours as needed for mild pain.     blood glucose meter kit and supplies KIT Dispense based on patient and insurance preference. Use up to four times daily as directed. (FOR E11.9). 1 each 0   carvedilol (COREG) 3.125 MG tablet Take 1 tablet (3.125 mg total) by mouth 2 (two) times daily with a meal. 180 tablet 2   empagliflozin (JARDIANCE) 10 MG TABS tablet Take 1 tablet (10 mg total) by mouth daily before breakfast. 90 tablet 2   Lancets 30G MISC Use to check blood sugars daily 100 each 3   losartan (COZAAR) 25 MG tablet TAKE 1 TABLET BY MOUTH ONCE DAILY. FOLLOW UP APPT DUE IN OCT MUST SEE PROVIDER FOR FUTURE REFILLS 90 tablet 2   metFORMIN (GLUCOPHAGE) 1000 MG tablet TAKE 1 TABLET BY MOUTH TWICE DAILY WITH A MEAL. FOLLOW-UP APPOINTMENT DUE IN OCTOBER. MUST SEE PROVIDER FOR FUTURE REFILLS 180 tablet 2   potassium chloride (KLOR-CON M) 10 MEQ tablet Take 1 tablet by mouth twice daily 180 tablet 0   rosuvastatin (CRESTOR) 20 MG tablet Take 1 tablet (20 mg total) by mouth daily. 90 tablet 2   warfarin (COUMADIN) 5 MG tablet TAKE 1 TO 1 AND 1/2 TABLETS BY MOUTH ONCE DAILY OR AS DIRECTED BY COUMADIN CLINIS ** CALL THE OFFICE FOR AN APPOINTMENT WITH DR** 45 tablet 0   No current facility-administered medications on file prior to visit.     Review of Systems  Constitutional:  Negative for fever.  Respiratory:  Positive for cough (occ). Negative for shortness of breath and wheezing.   Cardiovascular:   Negative for chest pain, palpitations and leg swelling.  Neurological:  Negative for light-headedness and headaches.       Objective:   Vitals:   04/02/23 1141  BP: 124/78  Pulse: 68  Temp: 98.4 F (36.9 C)  SpO2: 98%   BP Readings from Last 3 Encounters:  04/02/23 124/78  10/02/22 134/60  07/04/22 111/74   Wt Readings from Last 3 Encounters:  04/02/23 172 lb (78 kg)  10/02/22 166 lb (75.3 kg)  07/04/22 168 lb 6.9 oz (76.4 kg)   Body mass index is 22.08 kg/m.    Physical Exam Constitutional:      General: He is not in acute distress.    Appearance: Normal appearance. He is not ill-appearing.  HENT:     Head: Normocephalic and atraumatic.  Eyes:     Conjunctiva/sclera: Conjunctivae normal.  Cardiovascular:     Rate and Rhythm: Normal rate and regular rhythm.     Heart sounds: Normal heart sounds.  Pulmonary:     Effort: Pulmonary effort is normal. No respiratory distress.     Breath sounds: Normal breath sounds. No wheezing or rales.  Musculoskeletal:     Right lower leg: No edema.     Left lower leg: No edema.  Skin:    General: Skin is warm and dry.  Findings: No rash.  Neurological:     Mental Status: He is alert. Mental status is at baseline.  Psychiatric:        Mood and Affect: Mood normal.        Lab Results  Component Value Date   WBC 3.7 (L) 10/02/2022   HGB 12.9 (L) 10/02/2022   HCT 41.4 10/02/2022   PLT 234.0 10/02/2022   GLUCOSE 102 (H) 10/02/2022   CHOL 252 (H) 10/02/2022   TRIG 93.0 10/02/2022   HDL 57.80 10/02/2022   LDLDIRECT 135.3 06/22/2008   LDLCALC 176 (H) 10/02/2022   ALT 9 10/02/2022   AST 14 10/02/2022   NA 137 10/02/2022   K 4.5 10/02/2022   CL 104 10/02/2022   CREATININE 1.61 (H) 10/02/2022   BUN 17 10/02/2022   CO2 27 10/02/2022   TSH 0.75 06/24/2019   PSA 8.99 (H) 10/02/2022   INR 2.3 03/05/2023   HGBA1C 6.4 10/02/2022   MICROALBUR 7.0 (H) 03/27/2022     Assessment & Plan:    See Problem List for  Assessment and Plan of chronic medical problems.

## 2023-04-01 NOTE — Patient Instructions (Addendum)
      Blood work was ordered.   The lab is on the first floor.    Medications changes include :   none      Return in about 6 months (around 10/02/2023) for follow up.

## 2023-04-02 ENCOUNTER — Ambulatory Visit (INDEPENDENT_AMBULATORY_CARE_PROVIDER_SITE_OTHER): Payer: Medicare Other | Admitting: Internal Medicine

## 2023-04-02 VITALS — BP 124/78 | HR 68 | Temp 98.4°F | Ht 74.0 in | Wt 172.0 lb

## 2023-04-02 DIAGNOSIS — E1159 Type 2 diabetes mellitus with other circulatory complications: Secondary | ICD-10-CM

## 2023-04-02 DIAGNOSIS — Z8673 Personal history of transient ischemic attack (TIA), and cerebral infarction without residual deficits: Secondary | ICD-10-CM

## 2023-04-02 DIAGNOSIS — I2510 Atherosclerotic heart disease of native coronary artery without angina pectoris: Secondary | ICD-10-CM | POA: Diagnosis not present

## 2023-04-02 DIAGNOSIS — I5022 Chronic systolic (congestive) heart failure: Secondary | ICD-10-CM | POA: Diagnosis not present

## 2023-04-02 DIAGNOSIS — R531 Weakness: Secondary | ICD-10-CM

## 2023-04-02 DIAGNOSIS — I69998 Other sequelae following unspecified cerebrovascular disease: Secondary | ICD-10-CM

## 2023-04-02 DIAGNOSIS — E785 Hyperlipidemia, unspecified: Secondary | ICD-10-CM

## 2023-04-02 DIAGNOSIS — N400 Enlarged prostate without lower urinary tract symptoms: Secondary | ICD-10-CM

## 2023-04-02 DIAGNOSIS — I10 Essential (primary) hypertension: Secondary | ICD-10-CM | POA: Diagnosis not present

## 2023-04-02 LAB — CBC WITH DIFFERENTIAL/PLATELET
Basophils Absolute: 0.1 10*3/uL (ref 0.0–0.1)
Basophils Relative: 1.4 % (ref 0.0–3.0)
Eosinophils Absolute: 0.1 10*3/uL (ref 0.0–0.7)
Eosinophils Relative: 1.7 % (ref 0.0–5.0)
HCT: 42.6 % (ref 39.0–52.0)
Hemoglobin: 13.6 g/dL (ref 13.0–17.0)
Lymphocytes Relative: 17.1 % (ref 12.0–46.0)
Lymphs Abs: 0.9 10*3/uL (ref 0.7–4.0)
MCHC: 31.9 g/dL (ref 30.0–36.0)
MCV: 78.6 fl (ref 78.0–100.0)
Monocytes Absolute: 0.8 10*3/uL (ref 0.1–1.0)
Monocytes Relative: 14.7 % — ABNORMAL HIGH (ref 3.0–12.0)
Neutro Abs: 3.6 10*3/uL (ref 1.4–7.7)
Neutrophils Relative %: 65.1 % (ref 43.0–77.0)
Platelets: 265 10*3/uL (ref 150.0–400.0)
RBC: 5.42 Mil/uL (ref 4.22–5.81)
RDW: 15.3 % (ref 11.5–15.5)
WBC: 5.5 10*3/uL (ref 4.0–10.5)

## 2023-04-02 LAB — LIPID PANEL
Cholesterol: 239 mg/dL — ABNORMAL HIGH (ref 0–200)
HDL: 49.7 mg/dL (ref >39.00)
LDL Cholesterol: 169 mg/dL — ABNORMAL HIGH (ref 0–99)
NonHDL: 189.15
Total CHOL/HDL Ratio: 5
Triglycerides: 102 mg/dL (ref 0.0–149.0)
VLDL: 20.4 mg/dL (ref 0.0–40.0)

## 2023-04-02 LAB — COMPREHENSIVE METABOLIC PANEL
ALT: 7 U/L (ref 0–53)
AST: 15 U/L (ref 0–37)
Albumin: 3.9 g/dL (ref 3.5–5.2)
Alkaline Phosphatase: 68 U/L (ref 39–117)
BUN: 16 mg/dL (ref 6–23)
CO2: 27 mEq/L (ref 19–32)
Calcium: 10 mg/dL (ref 8.4–10.5)
Chloride: 102 mEq/L (ref 96–112)
Creatinine, Ser: 1.61 mg/dL — ABNORMAL HIGH (ref 0.40–1.50)
GFR: 43.67 mL/min — ABNORMAL LOW (ref >60.00)
Glucose, Bld: 97 mg/dL (ref 70–99)
Potassium: 5 mEq/L (ref 3.5–5.1)
Sodium: 135 mEq/L (ref 135–145)
Total Bilirubin: 0.6 mg/dL (ref 0.2–1.2)
Total Protein: 8.2 g/dL (ref 6.0–8.3)

## 2023-04-02 LAB — MICROALBUMIN / CREATININE URINE RATIO
Creatinine,U: 78.3 mg/dL
Microalb Creat Ratio: 5.6 mg/g (ref 0.0–30.0)
Microalb, Ur: 4.4 mg/dL — ABNORMAL HIGH (ref 0.0–1.9)

## 2023-04-02 LAB — HEMOGLOBIN A1C: Hgb A1c MFr Bld: 6.4 % (ref 4.6–6.5)

## 2023-04-02 NOTE — Assessment & Plan Note (Signed)
Chronic On warfarin, Jardiance 10 mg daily, Crestor 20 mg daily Blood pressure controlled Sugars controlled

## 2023-04-02 NOTE — Assessment & Plan Note (Signed)
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

## 2023-04-02 NOTE — Assessment & Plan Note (Signed)
Chronic Follow with cardiology Appears euvolemic Continue carvedilol 3.25 mg twice daily, Jardiance 10 mg daily, Losartan 25 mg daily

## 2023-04-02 NOTE — Assessment & Plan Note (Signed)
Chronic BP well controlled Continue coreg 3.125 mg bid, losartan 25 mg  cmp  

## 2023-04-02 NOTE — Assessment & Plan Note (Signed)
Chronic Regular exercise and healthy diet encouraged Check lipid panel, CMP Continue Crestor 20 mg daily 

## 2023-04-02 NOTE — Assessment & Plan Note (Signed)
Chronic   Lab Results  Component Value Date   HGBA1C 6.4 10/02/2022   Sugars controlled Check A1c Continue metformin 1000 mg twice daily, Jardiance 10 mg daily Stressed regular exercise, diabetic diet

## 2023-04-02 NOTE — Assessment & Plan Note (Signed)
Chronic No symptoms c/w angina Continue coreg 3.125 mg bid, losartan 25 mg daily, warfarin, Crestor 20 mg daily

## 2023-04-09 ENCOUNTER — Ambulatory Visit: Payer: Medicare Other

## 2023-04-09 NOTE — Telephone Encounter (Signed)
Left voice mail for pt call back for Medicare Wellness visit

## 2023-04-12 ENCOUNTER — Encounter: Payer: Self-pay | Admitting: Internal Medicine

## 2023-04-12 ENCOUNTER — Ambulatory Visit: Payer: Medicare Other | Attending: Internal Medicine | Admitting: Internal Medicine

## 2023-04-12 ENCOUNTER — Ambulatory Visit: Payer: Medicare Other

## 2023-04-12 VITALS — BP 142/68 | HR 88 | Ht 74.0 in | Wt 172.0 lb

## 2023-04-12 DIAGNOSIS — I5022 Chronic systolic (congestive) heart failure: Secondary | ICD-10-CM | POA: Diagnosis not present

## 2023-04-12 DIAGNOSIS — I10 Essential (primary) hypertension: Secondary | ICD-10-CM | POA: Insufficient documentation

## 2023-04-12 DIAGNOSIS — I255 Ischemic cardiomyopathy: Secondary | ICD-10-CM | POA: Insufficient documentation

## 2023-04-12 NOTE — Patient Instructions (Addendum)
Medication Instructions:  Your physician recommends that you continue on your current medications as directed. Please refer to the Current Medication list given to you today.  *If you need a refill on your cardiac medications before your next appointment, please call your pharmacy*  Lab Work: None ordered.  If you have labs (blood work) drawn today and your tests are completely normal, you will receive your results only by: MyChart Message (if you have MyChart) OR A paper copy in the mail If you have any lab test that is abnormal or we need to change your treatment, we will call you to review the results.  Testing/Procedures: None ordered.  Follow-Up: At [facility], you and your health needs are our priority.  As part of our continuing mission to provide you with exceptional heart care, we have created designated Provider Care Teams.  These Care Teams include your primary Cardiologist (physician) and Advanced Practice Providers (APPs -  Physician Assistants and Nurse Practitioners) who all work together to provide you with the care you need, when you need it.   Your next appointment:   2 Year follow up appointment with Dr. Lewayne Bunting.  The format for your next appointment:   In Person  Provider:   Lewayne Bunting, MD{or one of the following Advanced Practice Providers on your designated Care Team:   Francis Dowse, [place] Casimiro Needle "[facility]" [place], [place]

## 2023-04-12 NOTE — Progress Notes (Signed)
HPI [name] returns today for followup. 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 He has been stable. He has not had syncope. He notes a 100 lb weight loss in the last 9 years. He sustained a stroke several years ago. He has a residual left HP. He denies chest pain or sob. No edema.  Allergies  Allergen Reactions   Lipitor [Atorvastatin] Nausea And Vomiting     Current Outpatient Medications  Medication Sig Dispense Refill   acetaminophen (TYLENOL) 325 MG tablet Take 1-2 tablets (325-650 mg total) by mouth every 4 (four) hours as needed for mild pain.     blood glucose meter kit and supplies KIT Dispense based on patient and insurance preference. Use up to four times daily as directed. (FOR E11.9). 1 each 0   carvedilol (COREG) 3.125 MG tablet Take 1 tablet (3.125 mg total) by mouth 2 (two) times daily with a meal. 180 tablet 2   empagliflozin (JARDIANCE) 10 MG TABS tablet Take 1 tablet (10 mg total) by mouth daily before breakfast. 90 tablet 2   Lancets 30G MISC Use to check blood sugars daily 100 each 3   losartan (COZAAR) 25 MG tablet TAKE 1 TABLET BY MOUTH ONCE DAILY. FOLLOW UP APPT DUE IN OCT MUST SEE PROVIDER FOR FUTURE REFILLS 90 tablet 2   metFORMIN (GLUCOPHAGE) 1000 MG tablet TAKE 1 TABLET BY MOUTH TWICE DAILY WITH A MEAL. FOLLOW-UP APPOINTMENT DUE IN OCTOBER. MUST SEE PROVIDER FOR FUTURE REFILLS 180 tablet 2   potassium chloride (KLOR-CON M) 10 MEQ tablet Take 1 tablet by mouth twice daily 180 tablet 0   rosuvastatin (CRESTOR) 20 MG tablet Take 1 tablet (20 mg total) by mouth daily. 90 tablet 2   warfarin (COUMADIN) 5 MG tablet TAKE 1 TO 1 AND 1/2 TABLETS BY MOUTH ONCE DAILY OR AS DIRECTED BY COUMADIN CLINIS ** CALL THE OFFICE FOR AN APPOINTMENT WITH DR** 45 tablet 0   No current facility-administered medications for this visit.     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 Ladona Ridgel 15 years 1998   Dyspnea    Hypertension    Stroke [facility]) 2019   denies residual on 06/14/2018   TIA (transient ischemic attack) 12/19/2017   Type II diabetes mellitus (HCC) 07/2014 dx    ROS:   All systems reviewed and negative except as noted in the HPI.   Past Surgical History:  Procedure Laterality Date   CORONARY ANGIOPLASTY WITH STENT PLACEMENT  1998   FRACTURE SURGERY     GREEN LIGHT LASER TURP (TRANSURETHRAL RESECTION OF PROSTATE N/A 06/05/2018   Procedure: GREEN LIGHT LASER TURP , TRANSURETHRAL RESECTION OF PROSTATE;  Surgeon: Crista Elliot, MD;  Location: [facility];  Service: Urology;  Laterality: N/A;   TIBIA FRACTURE SURGERY Left 1980s     Family History  Problem Relation Age of Onset   Diabetes Mother    Diabetes Father    Stroke Father 81   Diabetes Sister    Clotting disorder Sister 98   Diabetes Other        nephew   Diabetes Brother    Diabetes Maternal Grandmother    Colon cancer Neg Hx    Esophageal cancer Neg Hx  Pancreatic cancer Neg Hx    Stomach cancer Neg Hx    Liver disease Neg Hx      Social History   Socioeconomic History   Marital status: Married    Spouse name: Not on file   Number of children: Not on file   Years of education: Not on file   Highest education level: Not on file  Occupational History   Not on file  Tobacco Use   Smoking status: Former    Packs/day: 2.00    Years: 5.00    Additional pack years: 0.00    Total pack years: 10.00    Types: Cigarettes    Quit date: 12/04/2000    Years since quitting: 22.3   Smokeless tobacco: Never  Vaping Use   Vaping Use: Never used  Substance and Sexual Activity   Alcohol use: Not Currently   Drug use: Not Currently   Sexual activity: Not on file  Other Topics Concern   Not on file  Social History Narrative   Former smoker - quit 1998 after stent -     Lives with dtr and 2 g-sons   Separated from wife in 2005, good terms   Employed with [employer] - walks at lot at work   No regular exercise   Social Determinants of Corporate investment banker Strain: Not on file  Food Insecurity: No Food Insecurity (07/06/2022)   Hunger Vital Sign    Worried About Running Out of Food in the Last Year: Never true    Ran Out of Food in the Last Year: Never true  Transportation Needs: No Transportation Needs (07/06/2022)   PRAPARE - Administrator, Civil Service (Medical): No    Lack of Transportation (Non-Medical): No  Physical Activity: Not on file  Stress: Not on file  Social Connections: Not on file  Intimate Partner Violence: Not on file     BP (!) 142/68   Pulse 88   Ht 6\' 2"  (1.88 m)   Wt 172 lb (78 kg)   SpO2 99%   BMI 22.08 kg/m   Physical Exam:  Well appearing NAD HEENT: Unremarkable Neck:  No JVD, no thyromegally Lymphatics:  No adenopathy Back:  No CVA tenderness Lungs:  Clear with no wheezes HEART:  Regular rate rhythm, no murmurs, no rubs, no clicks Abd:  soft, positive bowel sounds, no organomegally, no rebound, no guarding Ext:  2 plus pulses, no edema, no cyanosis, no clubbing Skin:  No rashes no nodules Neuro:  CN II through XII intact, motor grossly intact  EKG - NSR with RBBB and old IMI   Assess/Plan: Chronic systolic heart failure - his symptoms remain class 2. He will continue his current meds. He is still not interested in ICD insertion. Continue GDMT. Stroke - he is tolerating warfarin. Obesity - resolved. His weight is down 10 lbs.  Tobacco abuse - in remission.  Sharlot Gowda Andrika Peraza,MD

## 2023-04-17 ENCOUNTER — Ambulatory Visit: Payer: Medicare Other

## 2023-04-18 ENCOUNTER — Ambulatory Visit: Payer: Medicare Other | Attending: Cardiovascular Disease

## 2023-04-18 DIAGNOSIS — G459 Transient cerebral ischemic attack, unspecified: Secondary | ICD-10-CM | POA: Diagnosis not present

## 2023-04-18 DIAGNOSIS — Z5181 Encounter for therapeutic drug level monitoring: Secondary | ICD-10-CM | POA: Diagnosis not present

## 2023-04-18 LAB — POCT INR: INR: 2.6 (ref 2.0–3.0)

## 2023-04-18 NOTE — Patient Instructions (Signed)
Description   Continue taking the dose you have been taking, which is, warfarin 1 tablet daily except 1.5 tablets on Monday, Wednesday, and Friday.  Stay consistent with green each week.  Recheck INR in 6 weeks.  Call the Coumadin clinic with any questions #[phone].

## 2023-05-02 ENCOUNTER — Other Ambulatory Visit: Payer: Self-pay | Admitting: Internal Medicine

## 2023-05-06 ENCOUNTER — Other Ambulatory Visit: Payer: Self-pay | Admitting: Internal Medicine

## 2023-05-06 DIAGNOSIS — G459 Transient cerebral ischemic attack, unspecified: Secondary | ICD-10-CM

## 2023-05-06 DIAGNOSIS — Z7901 Long term (current) use of anticoagulants: Secondary | ICD-10-CM

## 2023-05-30 ENCOUNTER — Ambulatory Visit: Payer: Medicare Other

## 2023-06-11 ENCOUNTER — Other Ambulatory Visit: Payer: Self-pay | Admitting: *Deleted

## 2023-06-11 ENCOUNTER — Ambulatory Visit: Payer: Medicare Other | Admitting: *Deleted

## 2023-06-11 DIAGNOSIS — Z5181 Encounter for therapeutic drug level monitoring: Secondary | ICD-10-CM | POA: Insufficient documentation

## 2023-06-11 DIAGNOSIS — G459 Transient cerebral ischemic attack, unspecified: Secondary | ICD-10-CM

## 2023-06-11 DIAGNOSIS — Z7901 Long term (current) use of anticoagulants: Secondary | ICD-10-CM

## 2023-06-11 LAB — POCT INR: INR: 1.9 — AB (ref 2.0–3.0)

## 2023-06-11 MED ORDER — WARFARIN SODIUM 5 MG PO TABS
ORAL_TABLET | ORAL | 1 refills | Status: DC
Start: 2023-06-11 — End: 2023-08-27

## 2023-06-11 NOTE — Telephone Encounter (Signed)
Warfarin 5mg  refill  Last INR today 06/11/23 Last OV 04/12/23 (52yr follow up)

## 2023-06-11 NOTE — Patient Instructions (Signed)
Description   Today take 2 tablets of warfarin then continue taking the dose you have been taking, which is, warfarin 1 tablet daily except 1.5 tablets on Monday, Wednesday, and Friday.  Stay consistent with green each week.  Recheck INR in 5 weeks.  Call the Coumadin clinic with any questions #[phone].

## 2023-07-16 ENCOUNTER — Ambulatory Visit: Payer: Medicare Other | Attending: Cardiology

## 2023-07-16 DIAGNOSIS — G459 Transient cerebral ischemic attack, unspecified: Secondary | ICD-10-CM | POA: Diagnosis not present

## 2023-07-16 DIAGNOSIS — Z5181 Encounter for therapeutic drug level monitoring: Secondary | ICD-10-CM | POA: Diagnosis not present

## 2023-07-16 LAB — POCT INR: INR: 2.1 (ref 2.0–3.0)

## 2023-07-16 NOTE — Patient Instructions (Signed)
continue taking the dose you have been taking, which is, warfarin 1 tablet daily except 1.5 tablets on Monday, Wednesday, and Friday.  Stay consistent with green each week.  Recheck INR in 6 weeks.  Call the Coumadin clinic with any questions #[phone].

## 2023-08-27 ENCOUNTER — Other Ambulatory Visit: Payer: Self-pay

## 2023-08-27 ENCOUNTER — Ambulatory Visit: Payer: Medicare Other | Attending: Cardiology

## 2023-08-27 DIAGNOSIS — Z5181 Encounter for therapeutic drug level monitoring: Secondary | ICD-10-CM | POA: Diagnosis not present

## 2023-08-27 DIAGNOSIS — Z7901 Long term (current) use of anticoagulants: Secondary | ICD-10-CM

## 2023-08-27 DIAGNOSIS — G459 Transient cerebral ischemic attack, unspecified: Secondary | ICD-10-CM | POA: Diagnosis not present

## 2023-08-27 LAB — POCT INR: INR: 2.9 (ref 2.0–3.0)

## 2023-08-27 MED ORDER — WARFARIN SODIUM 5 MG PO TABS
ORAL_TABLET | ORAL | 1 refills | Status: DC
Start: 2023-08-27 — End: 2023-11-05

## 2023-08-27 NOTE — Patient Instructions (Signed)
continue taking the dose you have been taking, which is, warfarin 1 tablet daily except 1.5 tablets on Monday, Wednesday, and Friday.  Stay consistent with green each week.  Recheck INR in 6 weeks.  Call the Coumadin clinic with any questions #[phone].

## 2023-09-26 ENCOUNTER — Other Ambulatory Visit: Payer: Self-pay | Admitting: Internal Medicine

## 2023-09-30 ENCOUNTER — Encounter: Payer: Self-pay | Admitting: Internal Medicine

## 2023-09-30 DIAGNOSIS — N1831 Chronic kidney disease, stage 3a: Secondary | ICD-10-CM | POA: Insufficient documentation

## 2023-09-30 DIAGNOSIS — N183 Chronic kidney disease, stage 3 unspecified: Secondary | ICD-10-CM | POA: Insufficient documentation

## 2023-09-30 NOTE — Patient Instructions (Addendum)
      Blood work was ordered.   The lab is on the first floor.    Medications changes include :   none     Return in about 6 months (around 03/31/2024) for follow up.

## 2023-09-30 NOTE — Assessment & Plan Note (Addendum)
GFR decreased consistently Reviewed CKD Stressed good BP, sugar control Advised increased water intake, which she does do Avoid NSAIDs-he does not take any

## 2023-09-30 NOTE — Progress Notes (Unsigned)
Subjective:    Patient ID: Karl [name], male    DOB: 01-08-1956, 67 y.o.   MRN: [id number]     HPI Karl [name] is here for follow up of his chronic medical problems.  Ckd new  Medications and allergies reviewed with patient and updated if appropriate.  Current Outpatient Medications on File Prior to Visit  Medication Sig Dispense Refill   acetaminophen (TYLENOL) 325 MG tablet Take 1-2 tablets (325-650 mg total) by mouth every 4 (four) hours as needed for mild pain.     blood glucose meter kit and supplies KIT Dispense based on patient and insurance preference. Use up to four times daily as directed. (FOR E11.9). 1 each 0   carvedilol (COREG) 3.125 MG tablet TAKE 1 TABLET BY MOUTH TWICE DAILY WITH A MEAL 180 tablet 0   JARDIANCE 10 MG TABS tablet TAKE 1 TABLET BY MOUTH ONCE DAILY BEFORE BREAKFAST 90 tablet 0   Lancets 30G MISC Use to check blood sugars daily 100 each 3   losartan (COZAAR) 25 MG tablet TAKE 1 TABLET BY MOUTH ONCE DAILY. FOLLOW UP APPT DUE IN OCT MUST SEE PROVIDER FOR FUTURE REFILLS 90 tablet 2   metFORMIN (GLUCOPHAGE) 1000 MG tablet TAKE 1 TABLET BY MOUTH TWICE DAILY WITH A MEAL. FOLLOW-UP APPOINTMENT DUE IN OCTOBER. MUST SEE PROVIDER FOR FUTURE REFILLS 180 tablet 2   potassium chloride (KLOR-CON M) 10 MEQ tablet Take 1 tablet by mouth twice daily 180 tablet 1   rosuvastatin (CRESTOR) 20 MG tablet Take 1 tablet (20 mg total) by mouth daily. 90 tablet 2   warfarin (COUMADIN) 5 MG tablet TAKE 1 TO 1 AND 1/2 TABS BY MOUTH ONCE DAILY OR AS DIRECTED BY COUMADIN CLINIC. 45 tablet 1   No current facility-administered medications on file prior to visit.     Review of Systems     Objective:  There were no vitals filed for this visit. BP Readings from Last 3 Encounters:  04/12/23 (!) 142/68  04/02/23 124/78  10/02/22 134/60   Wt Readings from Last 3 Encounters:  04/12/23 172 lb (78 kg)  04/02/23 172 lb (78 kg)  10/02/22 166 lb (75.3 kg)   There is no height or  weight on file to calculate BMI.    Physical Exam     Lab Results  Component Value Date   WBC 5.5 04/02/2023   HGB 13.6 04/02/2023   HCT 42.6 04/02/2023   PLT 265.0 04/02/2023   GLUCOSE 97 04/02/2023   CHOL 239 (H) 04/02/2023   TRIG 102.0 04/02/2023   HDL 49.70 04/02/2023   LDLDIRECT 135.3 06/22/2008   LDLCALC 169 (H) 04/02/2023   ALT 7 04/02/2023   AST 15 04/02/2023   NA 135 04/02/2023   K 5.0 04/02/2023   CL 102 04/02/2023   CREATININE 1.61 (H) 04/02/2023   BUN 16 04/02/2023   CO2 27 04/02/2023   TSH 0.75 06/24/2019   PSA 8.99 (H) 10/02/2022   INR 2.9 08/27/2023   HGBA1C 6.4 04/02/2023   MICROALBUR 4.4 (H) 04/02/2023     Assessment & Plan:    See Problem List for Assessment and Plan of chronic medical problems.

## 2023-10-01 ENCOUNTER — Ambulatory Visit (INDEPENDENT_AMBULATORY_CARE_PROVIDER_SITE_OTHER): Payer: Medicare Other | Admitting: Internal Medicine

## 2023-10-01 VITALS — BP 134/70 | HR 80 | Temp 98.8°F | Ht 74.0 in

## 2023-10-01 DIAGNOSIS — I2510 Atherosclerotic heart disease of native coronary artery without angina pectoris: Secondary | ICD-10-CM | POA: Diagnosis not present

## 2023-10-01 DIAGNOSIS — Z7984 Long term (current) use of oral hypoglycemic drugs: Secondary | ICD-10-CM | POA: Diagnosis not present

## 2023-10-01 DIAGNOSIS — R531 Weakness: Secondary | ICD-10-CM

## 2023-10-01 DIAGNOSIS — I10 Essential (primary) hypertension: Secondary | ICD-10-CM | POA: Diagnosis not present

## 2023-10-01 DIAGNOSIS — I69998 Other sequelae following unspecified cerebrovascular disease: Secondary | ICD-10-CM

## 2023-10-01 DIAGNOSIS — E1159 Type 2 diabetes mellitus with other circulatory complications: Secondary | ICD-10-CM

## 2023-10-01 DIAGNOSIS — R7989 Other specified abnormal findings of blood chemistry: Secondary | ICD-10-CM | POA: Diagnosis not present

## 2023-10-01 DIAGNOSIS — E785 Hyperlipidemia, unspecified: Secondary | ICD-10-CM | POA: Diagnosis not present

## 2023-10-01 DIAGNOSIS — N1832 Chronic kidney disease, stage 3b: Secondary | ICD-10-CM

## 2023-10-01 DIAGNOSIS — I5022 Chronic systolic (congestive) heart failure: Secondary | ICD-10-CM

## 2023-10-01 DIAGNOSIS — Z1211 Encounter for screening for malignant neoplasm of colon: Secondary | ICD-10-CM

## 2023-10-01 LAB — COMPREHENSIVE METABOLIC PANEL
ALT: 172 U/L — ABNORMAL HIGH (ref 0–53)
AST: 100 U/L — ABNORMAL HIGH (ref 0–37)
Albumin: 3.9 g/dL (ref 3.5–5.2)
Alkaline Phosphatase: 114 U/L (ref 39–117)
BUN: 30 mg/dL — ABNORMAL HIGH (ref 6–23)
CO2: 25 meq/L (ref 19–32)
Calcium: 10 mg/dL (ref 8.4–10.5)
Chloride: 102 meq/L (ref 96–112)
Creatinine, Ser: 1.69 mg/dL — ABNORMAL HIGH (ref 0.40–1.50)
GFR: 41.05 mL/min — ABNORMAL LOW (ref >60.00)
Glucose, Bld: 123 mg/dL — ABNORMAL HIGH (ref 70–99)
Potassium: 4.7 meq/L (ref 3.5–5.1)
Sodium: 137 meq/L (ref 135–145)
Total Bilirubin: 1.6 mg/dL — ABNORMAL HIGH (ref 0.2–1.2)
Total Protein: 8.2 g/dL (ref 6.0–8.3)

## 2023-10-01 LAB — CBC WITH DIFFERENTIAL/PLATELET
Basophils Absolute: 0 10*3/uL (ref 0.0–0.1)
Basophils Relative: 0.2 % (ref 0.0–3.0)
Eosinophils Absolute: 0 10*3/uL (ref 0.0–0.7)
Eosinophils Relative: 0 % (ref 0.0–5.0)
HCT: 47.4 % (ref 39.0–52.0)
Hemoglobin: 14.8 g/dL (ref 13.0–17.0)
Lymphocytes Relative: 7.8 % — ABNORMAL LOW (ref 12.0–46.0)
Lymphs Abs: 0.9 10*3/uL (ref 0.7–4.0)
MCHC: 31.2 g/dL (ref 30.0–36.0)
MCV: 78.7 fL (ref 78.0–100.0)
Monocytes Absolute: 1.4 10*3/uL — ABNORMAL HIGH (ref 0.1–1.0)
Monocytes Relative: 11.7 % (ref 3.0–12.0)
Neutro Abs: 9.6 10*3/uL — ABNORMAL HIGH (ref 1.4–7.7)
Neutrophils Relative %: 80.3 % — ABNORMAL HIGH (ref 43.0–77.0)
Platelets: 342 10*3/uL (ref 150.0–400.0)
RBC: 6.02 Mil/uL — ABNORMAL HIGH (ref 4.22–5.81)
RDW: 15.8 % — ABNORMAL HIGH (ref 11.5–15.5)
WBC: 12 10*3/uL — ABNORMAL HIGH (ref 4.0–10.5)

## 2023-10-01 LAB — LIPID PANEL
Cholesterol: 237 mg/dL — ABNORMAL HIGH (ref 0–200)
HDL: 55.8 mg/dL
LDL Cholesterol: 160 mg/dL — ABNORMAL HIGH (ref 0–99)
NonHDL: 181.6
Total CHOL/HDL Ratio: 4
Triglycerides: 109 mg/dL (ref 0.0–149.0)
VLDL: 21.8 mg/dL (ref 0.0–40.0)

## 2023-10-01 LAB — HEMOGLOBIN A1C: Hgb A1c MFr Bld: 6.6 % — ABNORMAL HIGH (ref 4.6–6.5)

## 2023-10-01 NOTE — Assessment & Plan Note (Signed)
Chronic No symptoms c/w angina Continue coreg 3.125 mg bid, losartan 25 mg daily, warfarin, Crestor 20 mg daily

## 2023-10-01 NOTE — Assessment & Plan Note (Signed)
Chronic  Lab Results  Component Value Date   HGBA1C 6.4 04/02/2023   Sugars controlled Check A1c Continue metformin 1000 mg twice daily, Jardiance 10 mg daily Stressed regular exercise, diabetic diet

## 2023-10-01 NOTE — Assessment & Plan Note (Signed)
Chronic Regular exercise and healthy diet encouraged Check lipid panel, CMP Continue Crestor 20 mg daily 

## 2023-10-01 NOTE — Assessment & Plan Note (Signed)
Chronic On warfarin, Jardiance 10 mg daily, Crestor 20 mg daily Blood pressure controlled Sugars controlled

## 2023-10-01 NOTE — Assessment & Plan Note (Signed)
Chronic BP well controlled Continue coreg 3.125 mg bid, losartan 25 mg  Cmp, cbc

## 2023-10-01 NOTE — Assessment & Plan Note (Signed)
Chronic Follow with cardiology Appears euvolemic Continue carvedilol 3.25 mg twice daily, Jardiance 10 mg daily, Losartan 25 mg daily

## 2023-10-03 NOTE — Addendum Note (Signed)
Addended by: Pincus Sanes on: 10/03/2023 09:29 PM   Modules accepted: Orders

## 2023-10-08 ENCOUNTER — Ambulatory Visit: Payer: Medicare Other | Attending: Cardiology

## 2023-10-08 DIAGNOSIS — G459 Transient cerebral ischemic attack, unspecified: Secondary | ICD-10-CM | POA: Insufficient documentation

## 2023-10-08 DIAGNOSIS — Z5181 Encounter for therapeutic drug level monitoring: Secondary | ICD-10-CM | POA: Insufficient documentation

## 2023-10-08 LAB — POCT INR: INR: 2.1 (ref 2.0–3.0)

## 2023-10-08 NOTE — Patient Instructions (Signed)
Description   Continue taking the dose you have been taking, which is, warfarin 1 tablet daily except 1.5 tablets on Monday, Wednesday, and Friday.  Stay consistent with green each week.  Recheck INR in 6 weeks.  Call the Coumadin clinic with any questions #[phone].

## 2023-10-22 ENCOUNTER — Other Ambulatory Visit: Payer: Self-pay | Admitting: Internal Medicine

## 2023-11-02 ENCOUNTER — Other Ambulatory Visit: Payer: Self-pay | Admitting: Internal Medicine

## 2023-11-02 DIAGNOSIS — G459 Transient cerebral ischemic attack, unspecified: Secondary | ICD-10-CM

## 2023-11-02 DIAGNOSIS — Z7901 Long term (current) use of anticoagulants: Secondary | ICD-10-CM

## 2023-11-05 ENCOUNTER — Other Ambulatory Visit: Payer: Self-pay | Admitting: Internal Medicine

## 2023-11-19 ENCOUNTER — Ambulatory Visit: Payer: Medicare Other | Attending: Internal Medicine

## 2023-11-19 DIAGNOSIS — G459 Transient cerebral ischemic attack, unspecified: Secondary | ICD-10-CM | POA: Insufficient documentation

## 2023-11-19 DIAGNOSIS — Z5181 Encounter for therapeutic drug level monitoring: Secondary | ICD-10-CM | POA: Insufficient documentation

## 2023-11-19 LAB — POCT INR: INR: 3.8 — AB (ref 2.0–3.0)

## 2023-11-19 NOTE — Patient Instructions (Signed)
HOLD TOMORROW ONLY THEN Continue taking the dose you have been taking, which is, warfarin 1 tablet daily except 1.5 tablets on Monday, Wednesday, and Friday.  Stay consistent with green each week.  Recheck INR in 3 weeks.  Call the Coumadin clinic with any questions #[phone].

## 2023-12-10 ENCOUNTER — Ambulatory Visit: Payer: Medicare HMO | Attending: Internal Medicine | Admitting: *Deleted

## 2023-12-10 DIAGNOSIS — G459 Transient cerebral ischemic attack, unspecified: Secondary | ICD-10-CM

## 2023-12-10 DIAGNOSIS — Z5181 Encounter for therapeutic drug level monitoring: Secondary | ICD-10-CM | POA: Diagnosis not present

## 2023-12-10 LAB — POCT INR: INR: 3.6 — AB (ref 2.0–3.0)

## 2023-12-10 NOTE — Patient Instructions (Addendum)
 Description   Do not take any warfarin tomorrow then START taking warfarin 1 tablet daily except 1.5 tablets on Monday and Friday.  Stay consistent with green each week.  Recheck INR in 3 weeks.  Call the Coumadin  clinic with any questions [phone].

## 2023-12-23 ENCOUNTER — Other Ambulatory Visit: Payer: Self-pay | Admitting: Internal Medicine

## 2023-12-25 ENCOUNTER — Other Ambulatory Visit: Payer: Self-pay | Admitting: Internal Medicine

## 2023-12-25 DIAGNOSIS — Z7901 Long term (current) use of anticoagulants: Secondary | ICD-10-CM

## 2023-12-25 DIAGNOSIS — G459 Transient cerebral ischemic attack, unspecified: Secondary | ICD-10-CM

## 2023-12-25 NOTE — Telephone Encounter (Signed)
Warfarin 5mg  refill CVA, Afib Last INR 12/10/23 Last OV 04/12/23

## 2023-12-31 ENCOUNTER — Ambulatory Visit: Payer: Medicare HMO | Attending: Cardiology

## 2023-12-31 DIAGNOSIS — G459 Transient cerebral ischemic attack, unspecified: Secondary | ICD-10-CM | POA: Diagnosis not present

## 2023-12-31 DIAGNOSIS — Z5181 Encounter for therapeutic drug level monitoring: Secondary | ICD-10-CM | POA: Diagnosis not present

## 2023-12-31 LAB — POCT INR: INR: 2.3 (ref 2.0–3.0)

## 2023-12-31 NOTE — Patient Instructions (Signed)
Description   START taking warfarin 1 tablet daily except 1.5 tablets on Monday and Friday.  Stay consistent with green each week.  Recheck INR in 3 weeks.  Call the Coumadin clinic with any questions #[phone].

## 2024-01-07 ENCOUNTER — Other Ambulatory Visit: Payer: Self-pay | Admitting: Internal Medicine

## 2024-01-21 ENCOUNTER — Ambulatory Visit: Payer: Medicare HMO | Attending: Cardiovascular Disease

## 2024-01-21 ENCOUNTER — Other Ambulatory Visit: Payer: Self-pay | Admitting: Internal Medicine

## 2024-01-21 DIAGNOSIS — G459 Transient cerebral ischemic attack, unspecified: Secondary | ICD-10-CM

## 2024-01-21 DIAGNOSIS — Z5181 Encounter for therapeutic drug level monitoring: Secondary | ICD-10-CM | POA: Diagnosis not present

## 2024-01-21 LAB — POCT INR: INR: 2.1 (ref 2.0–3.0)

## 2024-01-21 NOTE — Patient Instructions (Signed)
 Continue 1 tablet daily except 1.5 tablets on Monday and Friday.  Stay consistent with green each week.  Recheck INR in 4 weeks.  Call the Coumadin clinic with any questions #[phone].

## 2024-01-30 ENCOUNTER — Other Ambulatory Visit: Payer: Self-pay | Admitting: Internal Medicine

## 2024-02-18 ENCOUNTER — Ambulatory Visit: Payer: Medicare HMO | Attending: Internal Medicine | Admitting: *Deleted

## 2024-02-18 DIAGNOSIS — G459 Transient cerebral ischemic attack, unspecified: Secondary | ICD-10-CM

## 2024-02-18 DIAGNOSIS — Z5181 Encounter for therapeutic drug level monitoring: Secondary | ICD-10-CM | POA: Diagnosis not present

## 2024-02-18 LAB — POCT INR: INR: 2.8 (ref 2.0–3.0)

## 2024-02-18 NOTE — Patient Instructions (Signed)
 Description   Continue taking warfarin 1 tablet daily except 1.5 tablets on Monday and Friday.  Stay consistent with green each week.  Recheck INR in 5 weeks.  Call the Coumadin clinic with any questions #[phone].

## 2024-03-05 ENCOUNTER — Other Ambulatory Visit: Payer: Self-pay | Admitting: Internal Medicine

## 2024-03-05 DIAGNOSIS — G459 Transient cerebral ischemic attack, unspecified: Secondary | ICD-10-CM

## 2024-03-05 DIAGNOSIS — Z7901 Long term (current) use of anticoagulants: Secondary | ICD-10-CM

## 2024-03-05 NOTE — Telephone Encounter (Signed)
 Warfarin 5mg  refill CVA Last INR 02/18/24 Last OV 04/12/23

## 2024-03-21 ENCOUNTER — Other Ambulatory Visit: Payer: Self-pay | Admitting: Internal Medicine

## 2024-03-24 ENCOUNTER — Ambulatory Visit: Attending: Internal Medicine

## 2024-03-24 DIAGNOSIS — G459 Transient cerebral ischemic attack, unspecified: Secondary | ICD-10-CM | POA: Diagnosis not present

## 2024-03-24 DIAGNOSIS — Z5181 Encounter for therapeutic drug level monitoring: Secondary | ICD-10-CM | POA: Diagnosis not present

## 2024-03-24 LAB — POCT INR: INR: 2.6 (ref 2.0–3.0)

## 2024-03-24 NOTE — Patient Instructions (Signed)
 Description   Continue taking warfarin 1 tablet daily except 1.5 tablets on Monday and Friday.  Stay consistent with green each week.  Recheck INR in 6 weeks.  Call the Coumadin  clinic with any questions [phone].

## 2024-03-30 ENCOUNTER — Encounter: Payer: Self-pay | Admitting: Internal Medicine

## 2024-03-30 NOTE — Progress Notes (Unsigned)
 Subjective:    Patient ID: Karl [name], male    DOB: Jul 16, 1956, 69 y.o.   MRN: [id number]     HPI Karl [name] is here for follow up of his chronic medical problems.  Walking daily with his rollator.-Walks up and down the hall where he lives 3 times a day.  Not eating as much - has lost weight.  His weight is stable.    Medications and allergies reviewed with patient and updated if appropriate.  Current Outpatient Medications on File Prior to Visit  Medication Sig Dispense Refill   acetaminophen  (TYLENOL ) 325 MG tablet Take 1-2 tablets (325-650 mg total) by mouth every 4 (four) hours as needed for mild pain.     carvedilol  (COREG ) 3.125 MG tablet TAKE 1 TABLET BY MOUTH TWICE DAILY WITH A MEAL 180 tablet 0   JARDIANCE  10 MG TABS tablet TAKE 1 TABLET BY MOUTH ONCE DAILY BEFORE BREAKFAST 90 tablet 0   Lancets 30G MISC Use to check blood sugars daily 100 each 3   losartan  (COZAAR ) 25 MG tablet TAKE 1 TABLET BY MOUTH ONCE DAILY **FOLLOW  UP  APPOINTMENT  DUE  IN  OCTOBER  MUST  SEE  PROVIDER  FOR  FUTURE  REFILLS** 90 tablet 0   metFORMIN  (GLUCOPHAGE ) 1000 MG tablet TAKE 1 TABLET BY MOUTH TWICE DAILY WITH MEALS 180 tablet 0   potassium chloride  (KLOR-CON  M) 10 MEQ tablet Take 1 tablet by mouth twice daily 180 tablet 0   rosuvastatin  (CRESTOR ) 20 MG tablet Take 1 tablet (20 mg total) by mouth daily. 90 tablet 2   warfarin (COUMADIN ) 5 MG tablet TAKE 1 -1.5 TABLETS BY MOUTH ONCE DAILY OR AS DIRECTED BY COUMADIN  CLINIC 40 tablet 2   No current facility-administered medications on file prior to visit.     Review of Systems  Constitutional:  Negative for fever.  Respiratory:  Positive for cough and shortness of breath (occ). Negative for wheezing.   Cardiovascular:  Negative for chest pain, palpitations and leg swelling.  Neurological:  Negative for light-headedness and headaches.       Objective:   Vitals:   03/31/24 1013  BP: 136/80  Pulse: 72  Temp: 98.2 F (36.8 C)   SpO2: 97%   BP Readings from Last 3 Encounters:  03/31/24 136/80  10/01/23 134/70  04/12/23 (!) 142/68   Wt Readings from Last 3 Encounters:  03/31/24 159 lb (72.1 kg)  04/12/23 172 lb (78 kg)  04/02/23 172 lb (78 kg)   Body mass index is 20.41 kg/m.    Physical Exam Constitutional:      General: He is not in acute distress.    Appearance: Normal appearance. He is not ill-appearing.  HENT:     Head: Normocephalic and atraumatic.  Eyes:     Conjunctiva/sclera: Conjunctivae normal.  Cardiovascular:     Rate and Rhythm: Normal rate and regular rhythm.     Heart sounds: Normal heart sounds.  Pulmonary:     Effort: Pulmonary effort is normal. No respiratory distress.     Breath sounds: Normal breath sounds. No wheezing or rales.  Musculoskeletal:     Right lower leg: No edema.     Left lower leg: No edema.  Skin:    General: Skin is warm and dry.     Findings: No rash.  Neurological:     Mental Status: He is alert. Mental status is at baseline.  Psychiatric:  Mood and Affect: Mood normal.        Lab Results  Component Value Date   WBC 12.0 (H) 10/01/2023   HGB 14.8 10/01/2023   HCT 47.4 10/01/2023   PLT 342.0 10/01/2023   GLUCOSE 123 (H) 10/01/2023   CHOL 237 (H) 10/01/2023   TRIG 109.0 10/01/2023   HDL 55.80 10/01/2023   LDLDIRECT 135.3 06/22/2008   LDLCALC 160 (H) 10/01/2023   ALT 172 (H) 10/01/2023   AST 100 (H) 10/01/2023   NA 137 10/01/2023   K 4.7 10/01/2023   CL 102 10/01/2023   CREATININE 1.69 (H) 10/01/2023   BUN 30 (H) 10/01/2023   CO2 25 10/01/2023   TSH 0.75 06/24/2019   PSA 8.99 (H) 10/02/2022   INR 2.6 03/24/2024   HGBA1C 6.6 (H) 10/01/2023   MICROALBUR 4.4 (H) 04/02/2023     Assessment & Plan:    See Problem List for Assessment and Plan of chronic medical problems.

## 2024-03-31 ENCOUNTER — Ambulatory Visit (INDEPENDENT_AMBULATORY_CARE_PROVIDER_SITE_OTHER): Payer: Medicare Other | Admitting: Internal Medicine

## 2024-03-31 VITALS — BP 136/80 | HR 72 | Temp 98.2°F | Ht 74.0 in | Wt 159.0 lb

## 2024-03-31 DIAGNOSIS — N1832 Chronic kidney disease, stage 3b: Secondary | ICD-10-CM

## 2024-03-31 DIAGNOSIS — E785 Hyperlipidemia, unspecified: Secondary | ICD-10-CM | POA: Diagnosis not present

## 2024-03-31 DIAGNOSIS — G8110 Spastic hemiplegia affecting unspecified side: Secondary | ICD-10-CM | POA: Diagnosis not present

## 2024-03-31 DIAGNOSIS — R7989 Other specified abnormal findings of blood chemistry: Secondary | ICD-10-CM

## 2024-03-31 DIAGNOSIS — I2510 Atherosclerotic heart disease of native coronary artery without angina pectoris: Secondary | ICD-10-CM

## 2024-03-31 DIAGNOSIS — R531 Weakness: Secondary | ICD-10-CM

## 2024-03-31 DIAGNOSIS — E1159 Type 2 diabetes mellitus with other circulatory complications: Secondary | ICD-10-CM | POA: Diagnosis not present

## 2024-03-31 DIAGNOSIS — I5022 Chronic systolic (congestive) heart failure: Secondary | ICD-10-CM | POA: Diagnosis not present

## 2024-03-31 DIAGNOSIS — I10 Essential (primary) hypertension: Secondary | ICD-10-CM

## 2024-03-31 DIAGNOSIS — Z7984 Long term (current) use of oral hypoglycemic drugs: Secondary | ICD-10-CM

## 2024-03-31 DIAGNOSIS — I69998 Other sequelae following unspecified cerebrovascular disease: Secondary | ICD-10-CM

## 2024-03-31 LAB — CBC WITH DIFFERENTIAL/PLATELET
Basophils Absolute: 0 10*3/uL (ref 0.0–0.1)
Basophils Relative: 0.6 % (ref 0.0–3.0)
Eosinophils Absolute: 0.1 10*3/uL (ref 0.0–0.7)
Eosinophils Relative: 2 % (ref 0.0–5.0)
HCT: 44.1 % (ref 39.0–52.0)
Hemoglobin: 14.1 g/dL (ref 13.0–17.0)
Lymphocytes Relative: 27 % (ref 12.0–46.0)
Lymphs Abs: 1.4 10*3/uL (ref 0.7–4.0)
MCHC: 31.9 g/dL (ref 30.0–36.0)
MCV: 78 fl (ref 78.0–100.0)
Monocytes Absolute: 0.6 10*3/uL (ref 0.1–1.0)
Monocytes Relative: 12.1 % — ABNORMAL HIGH (ref 3.0–12.0)
Neutro Abs: 3.1 10*3/uL (ref 1.4–7.7)
Neutrophils Relative %: 58.3 % (ref 43.0–77.0)
Platelets: 324 10*3/uL (ref 150.0–400.0)
RBC: 5.65 Mil/uL (ref 4.22–5.81)
RDW: 17.2 % — ABNORMAL HIGH (ref 11.5–15.5)
WBC: 5.3 10*3/uL (ref 4.0–10.5)

## 2024-03-31 LAB — COMPREHENSIVE METABOLIC PANEL WITH GFR
ALT: 8 U/L (ref 0–53)
AST: 12 U/L (ref 0–37)
Albumin: 4.1 g/dL (ref 3.5–5.2)
Alkaline Phosphatase: 87 U/L (ref 39–117)
BUN: 14 mg/dL (ref 6–23)
CO2: 26 meq/L (ref 19–32)
Calcium: 9.9 mg/dL (ref 8.4–10.5)
Chloride: 102 meq/L (ref 96–112)
Creatinine, Ser: 1.42 mg/dL (ref 0.40–1.50)
GFR: 50.41 mL/min — ABNORMAL LOW (ref >60.00)
Glucose, Bld: 110 mg/dL — ABNORMAL HIGH (ref 70–99)
Potassium: 4.2 meq/L (ref 3.5–5.1)
Sodium: 136 meq/L (ref 135–145)
Total Bilirubin: 0.7 mg/dL (ref 0.2–1.2)
Total Protein: 8.5 g/dL — ABNORMAL HIGH (ref 6.0–8.3)

## 2024-03-31 LAB — LIPID PANEL
Cholesterol: 258 mg/dL — ABNORMAL HIGH (ref 0–200)
HDL: 55.3 mg/dL (ref >39.00)
LDL Cholesterol: 180 mg/dL — ABNORMAL HIGH (ref 0–99)
NonHDL: 203.01
Total CHOL/HDL Ratio: 5
Triglycerides: 117 mg/dL (ref 0.0–149.0)
VLDL: 23.4 mg/dL (ref 0.0–40.0)

## 2024-03-31 LAB — MICROALBUMIN / CREATININE URINE RATIO
Creatinine,U: 71.5 mg/dL
Microalb Creat Ratio: 65.5 mg/g — ABNORMAL HIGH (ref 0.0–30.0)
Microalb, Ur: 4.7 mg/dL — ABNORMAL HIGH (ref 0.0–1.9)

## 2024-03-31 LAB — AMYLASE: Amylase: 95 U/L (ref 27–131)

## 2024-03-31 LAB — TSH: TSH: 1.36 u[IU]/mL (ref 0.35–5.50)

## 2024-03-31 LAB — LIPASE: Lipase: 22 U/L (ref 11.0–59.0)

## 2024-03-31 LAB — HEMOGLOBIN A1C: Hgb A1c MFr Bld: 6.6 % — ABNORMAL HIGH (ref 4.6–6.5)

## 2024-03-31 MED ORDER — METFORMIN HCL 1000 MG PO TABS: 1000.0000 mg | ORAL_TABLET | Freq: Two times a day (BID) | ORAL | 1 refills | Status: DC

## 2024-03-31 MED ORDER — CARVEDILOL 3.125 MG PO TABS: 3.1250 mg | ORAL_TABLET | Freq: Two times a day (BID) | ORAL | 1 refills | Status: DC

## 2024-03-31 MED ORDER — ROSUVASTATIN CALCIUM 20 MG PO TABS: 20.0000 mg | ORAL_TABLET | Freq: Every day | ORAL | 1 refills | Status: DC

## 2024-03-31 MED ORDER — EMPAGLIFLOZIN 10 MG PO TABS: 10.0000 mg | ORAL_TABLET | Freq: Every day | ORAL | 1 refills | Status: DC

## 2024-03-31 MED ORDER — LOSARTAN POTASSIUM 25 MG PO TABS: ORAL_TABLET | ORAL | 1 refills | Status: DC

## 2024-03-31 NOTE — Patient Instructions (Addendum)
      Blood work was ordered.       Medications changes include :   None      Return in about 6 months (around 09/30/2024) for follow up.

## 2024-03-31 NOTE — Assessment & Plan Note (Signed)
Chronic Follow with cardiology Appears euvolemic Continue carvedilol 3.25 mg twice daily, Jardiance 10 mg daily, Losartan 25 mg daily

## 2024-03-31 NOTE — Assessment & Plan Note (Signed)
 Chronic Regular exercise and healthy diet encouraged Check lipid panel, CMP Continue Crestor 20 mg daily

## 2024-03-31 NOTE — Assessment & Plan Note (Signed)
 Chronic  Lab Results  Component Value Date   HGBA1C 6.6 (H) 10/01/2023   Sugars controlled Check A1c, urine microalbumin Continue metformin  1000 mg twice daily, Jardiance  10 mg daily Stressed regular exercise, diabetic diet

## 2024-03-31 NOTE — Assessment & Plan Note (Signed)
Chronic Mild No longer taking baclofen

## 2024-03-31 NOTE — Assessment & Plan Note (Signed)
Chronic No symptoms c/w angina Continue coreg 3.125 mg bid, losartan 25 mg daily, warfarin, Crestor 20 mg daily

## 2024-03-31 NOTE — Assessment & Plan Note (Addendum)
 Chronic On warfarin, Jardiance  10 mg daily, Crestor  20 mg daily Blood pressure controlled Sugars controlled Continue regular exercise-continue regular walking with walker

## 2024-03-31 NOTE — Assessment & Plan Note (Signed)
Chronic BP well controlled Continue coreg 3.125 mg bid, losartan 25 mg  Cmp, cbc

## 2024-03-31 NOTE — Assessment & Plan Note (Signed)
 Chronic GFR decreased consistently Stressed good BP, sugar control Continue increased water  intake Avoid NSAIDs-he does not take any Continue losartan  25 mg daily, Jardiance  10 mg daily

## 2024-04-09 ENCOUNTER — Other Ambulatory Visit: Payer: Self-pay

## 2024-04-09 DIAGNOSIS — G459 Transient cerebral ischemic attack, unspecified: Secondary | ICD-10-CM

## 2024-04-09 DIAGNOSIS — Z7901 Long term (current) use of anticoagulants: Secondary | ICD-10-CM

## 2024-04-09 MED ORDER — WARFARIN SODIUM 5 MG PO TABS: ORAL_TABLET | ORAL | 0 refills | Status: DC

## 2024-04-09 NOTE — Telephone Encounter (Signed)
 Prescription refill request received for warfarin Lov: 04/12/23 Karl [name])  Next INR check: 05/05/24 Warfarin tablet strength: 5mg   Appropriate dose. Refill sent.

## 2024-04-14 ENCOUNTER — Encounter: Admitting: Internal Medicine

## 2024-04-14 ENCOUNTER — Ambulatory Visit: Admitting: Internal Medicine

## 2024-04-15 ENCOUNTER — Other Ambulatory Visit: Payer: Self-pay | Admitting: Internal Medicine

## 2024-04-16 ENCOUNTER — Other Ambulatory Visit: Payer: Self-pay

## 2024-04-16 ENCOUNTER — Ambulatory Visit: Payer: Self-pay

## 2024-04-16 ENCOUNTER — Telehealth: Payer: Self-pay | Admitting: Internal Medicine

## 2024-04-16 MED ORDER — LOSARTAN POTASSIUM 25 MG PO TABS: ORAL_TABLET | ORAL | 1 refills | Status: DC

## 2024-04-16 NOTE — Telephone Encounter (Signed)
 Copied from CRM [id number]. Topic: Clinical - Prescription Issue >> Apr 16, 2024  1:05 PM Kita Perish H wrote: Reason for CRM: Patient is following up on refill request for his losartan  (COZAAR ) 25 MG tablet, medication shows discontinued by provider due to needing an appointment, patient was in to see provider on 4/28.  [place] [phone]

## 2024-04-16 NOTE — Telephone Encounter (Signed)
 Copied from CRM [id number]. Topic: Clinical - Medication Refill >> Apr 16, 2024  1:00 PM Kita Perish H wrote: Medication: potassium chloride  (KLOR-CON  M) 10 MEQ tablet  Has the patient contacted their pharmacy? Yes, couldn't fill until 5/28 but patient is out (Agent: If no, request that the patient contact the pharmacy for the refill. If patient does not wish to contact the pharmacy document the reason why and proceed with request.) (Agent: If yes, when and what did the pharmacy advise?)  This is the patient's preferred pharmacy:  [facility] - [place], [place] - [address] [place] [address] Phone: [phone] Fax: [phone]    Is this the correct pharmacy for this prescription? Yes If no, delete pharmacy and type the correct one.   Has the prescription been filled recently? No  Is the patient out of the medication? Yes  Has the patient been seen for an appointment in the last year OR does the patient have an upcoming appointment? Yes  Can we respond through MyChart? No  Agent: Please be advised that Rx refills may take up to 3 business days. We ask that you follow-up with your pharmacy.

## 2024-04-16 NOTE — Telephone Encounter (Signed)
 Medication was ordered on 4/28 and sent to [facility] in [place].  Reordered script again today. No message showing where patient needed appointment from previous message.

## 2024-04-21 NOTE — Telephone Encounter (Signed)
 Copied from CRM [id number]. Topic: Clinical - Medication Refill >> Apr 21, 2024  9:34 AM Marissa P wrote: Medication: potassium chloride  (KLOR-CON  M) 10 MEQ tablet  Has the patient contacted their pharmacy? Yes (Agent: If no, request that the patient contact the pharmacy for the refill. If patient does not wish to contact the pharmacy document the reason why and proceed with request.) (Agent: If yes, when and what did the pharmacy advise?)  This is the patient's preferred pharmacy:  [facility] - [place], [place] - [address] [place] [address] Phone: [phone] Fax: [phone]   Is this the correct pharmacy for this prescription? Yes If no, delete pharmacy and type the correct one.   Has the prescription been filled recently? No  Is the patient out of the medication? Yes  Has the patient been seen for an appointment in the last year OR does the patient have an upcoming appointment? Yes  Can we respond through MyChart? No  Agent: Please be advised that Rx refills may take up to 3 business days. We ask that you follow-up with your pharmacy.

## 2024-04-22 ENCOUNTER — Ambulatory Visit: Payer: Self-pay

## 2024-04-22 NOTE — Telephone Encounter (Signed)
  Chief Complaint: Vomited 1 x last night.  Symptoms: Vomited 1x last night, today stomach is "a little jittery". Feels a little weak Frequency: last night - today Pertinent Negatives: Patient denies any other s/s Disposition: [] ED /[] Urgent Care (no appt availability in office) / [] Appointment(In office/virtual)/ []  [facility]/ [x] Home Care/ [] Refused Recommended Disposition /[] [facility]/ []  Follow-up with PCP Additional Notes: Returned pt and pt's sister call. Pt vomited last night 1x.  He has not eaten since then, he just took some pepto bismol. Pt has had water  and it has stayed down. Pt states he feels a little weak. Pt will try BRAT diet, in small amounts. Pt/ sister will call back if vomiting, or weakness increases.   Summary: Throwing up   Copied From CRM [id number]. Reason for Triage: Patient's sister called stating that patient woke up throwing up, no fever, a little weak, and has not eaten anything today. He has not been taking potassium chloride  (KLOR-CON  M) 10 MEQ tablet for two weeks because he is out of this medication and unsure if that had anything to do with it Call back numbers are [phone]     Reason for Disposition  MILD or MODERATE vomiting (e.g., 1 - 5 times / day)  Answer Assessment - Initial Assessment Questions 1. VOMITING SEVERITY: "How many times have you vomited in the past 24 hours?"     - MILD:  1 - 2 times/day    - MODERATE: 3 - 5 times/day, decreased oral intake without significant weight loss or symptoms of dehydration    - SEVERE: 6 or more times/day, vomits everything or nearly everything, with significant weight loss, symptoms of dehydration      mild 2. ONSET: "When did the vomiting begin?"      Last night 3. FLUIDS: "What fluids or food have you vomited up today?" "Have you been able to keep any fluids down?"     Water , stomach still feels jittery 4. ABDOMEN PAIN: "Are your having any abdomen pain?" If Yes :  "How bad is it and what does it feel like?" (e.g., crampy, dull, intermittent, constant)      no 5. DIARRHEA: "Is there any diarrhea?" If Yes, ask: "How many times today?"      no 6. CONTACTS: "Is there anyone else in the family with the same symptoms?"      Just pt 7. CAUSE: "What do you think is causing your vomiting?"     no 8. HYDRATION STATUS: "Any signs of dehydration?" (e.g., dry mouth [not only dry lips], too weak to stand) "When did you last urinate?"     na 9. OTHER SYMPTOMS: "Do you have any other symptoms?" (e.g., fever, headache, vertigo, vomiting blood or coffee grounds, recent head injury)     no  Protocols used: Vomiting-A-AH

## 2024-04-29 ENCOUNTER — Other Ambulatory Visit: Payer: Self-pay | Admitting: Internal Medicine

## 2024-04-29 NOTE — Telephone Encounter (Signed)
 Copied from CRM [id number]. Topic: Clinical - Medication Refill >> Apr 29, 2024  2:50 PM Karl [name] wrote: Medication: potassium chloride  (KLOR-CON  M) 10 MEQ tablet  Has the patient contacted their pharmacy? Yes (Agent: If no, request that the patient contact the pharmacy for the refill. If patient does not wish to contact the pharmacy document the reason why and proceed with request.) (Agent: If yes, when and what did the pharmacy advise?)  This is the patient's preferred pharmacy:  [facility] - [place], [place] - [address] [place] [address] Phone: [phone] Fax: [phone]    Is this the correct pharmacy for this prescription? Yes If no, delete pharmacy and type the correct one.   Has the prescription been filled recently? Yes  Is the patient out of the medication? Yes  Has the patient been seen for an appointment in the last year OR does the patient have an upcoming appointment? Yes  Can we respond through MyChart? Yes  Agent: Please be advised that Rx refills may take up to 3 business days. We ask that you follow-up with your pharmacy.

## 2024-04-30 MED ORDER — POTASSIUM CHLORIDE CRYS ER 10 MEQ PO TBCR
10.0000 meq | EXTENDED_RELEASE_TABLET | Freq: Two times a day (BID) | ORAL | 1 refills | Status: AC
Start: 2024-04-30 — End: ?

## 2024-05-05 ENCOUNTER — Ambulatory Visit

## 2024-05-12 ENCOUNTER — Telehealth: Payer: Self-pay | Admitting: Internal Medicine

## 2024-05-15 NOTE — Telephone Encounter (Signed)
 Pt is requesting that we rre-send rx to  [facility] [address], [place] - [address]

## 2024-05-16 ENCOUNTER — Other Ambulatory Visit: Payer: Self-pay | Admitting: Internal Medicine

## 2024-05-16 ENCOUNTER — Other Ambulatory Visit: Payer: Self-pay

## 2024-05-16 DIAGNOSIS — G459 Transient cerebral ischemic attack, unspecified: Secondary | ICD-10-CM

## 2024-05-16 DIAGNOSIS — Z7901 Long term (current) use of anticoagulants: Secondary | ICD-10-CM

## 2024-05-16 MED ORDER — ROSUVASTATIN CALCIUM 20 MG PO TABS
20.0000 mg | ORAL_TABLET | Freq: Every day | ORAL | 1 refills | Status: DC
Start: 2024-05-16 — End: 2024-09-01

## 2024-05-16 MED ORDER — EMPAGLIFLOZIN 10 MG PO TABS: 10.0000 mg | ORAL_TABLET | Freq: Every day | ORAL | 1 refills | Status: DC

## 2024-05-16 MED ORDER — POTASSIUM CHLORIDE CRYS ER 10 MEQ PO TBCR: 10.0000 meq | EXTENDED_RELEASE_TABLET | Freq: Two times a day (BID) | ORAL | 1 refills | Status: DC

## 2024-05-16 MED ORDER — METFORMIN HCL 1000 MG PO TABS: 1000.0000 mg | ORAL_TABLET | Freq: Two times a day (BID) | ORAL | 0 refills | Status: DC

## 2024-05-16 MED ORDER — LOSARTAN POTASSIUM 25 MG PO TABS: ORAL_TABLET | ORAL | 0 refills | Status: DC

## 2024-05-16 MED ORDER — CARVEDILOL 3.125 MG PO TABS: 3.1250 mg | ORAL_TABLET | Freq: Two times a day (BID) | ORAL | 1 refills | Status: DC

## 2024-05-16 NOTE — Telephone Encounter (Signed)
 All meds managed by Dr. Donnette Gal sent in today again to [facility] in [place].

## 2024-05-22 ENCOUNTER — Other Ambulatory Visit (HOSPITAL_COMMUNITY): Payer: Self-pay

## 2024-05-22 ENCOUNTER — Ambulatory Visit: Attending: Internal Medicine | Admitting: *Deleted

## 2024-05-22 ENCOUNTER — Ambulatory Visit: Payer: Self-pay | Admitting: Internal Medicine

## 2024-05-22 DIAGNOSIS — Z7901 Long term (current) use of anticoagulants: Secondary | ICD-10-CM | POA: Diagnosis not present

## 2024-05-22 DIAGNOSIS — G459 Transient cerebral ischemic attack, unspecified: Secondary | ICD-10-CM | POA: Diagnosis not present

## 2024-05-22 DIAGNOSIS — Z5181 Encounter for therapeutic drug level monitoring: Secondary | ICD-10-CM | POA: Diagnosis not present

## 2024-05-22 DIAGNOSIS — Z8673 Personal history of transient ischemic attack (TIA), and cerebral infarction without residual deficits: Secondary | ICD-10-CM

## 2024-05-22 LAB — PROTIME-INR
INR: >10 (ref 0.9–1.2)
Prothrombin Time: >120 s — ABNORMAL HIGH (ref 9.1–12.0)

## 2024-05-22 LAB — POCT INR: POC INR: 8

## 2024-05-22 MED ORDER — RIVAROXABAN 20 MG PO TABS
20.0000 mg | ORAL_TABLET | Freq: Every day | ORAL | 0 refills | Status: DC
  Filled 2024-05-22: qty 30, 30d supply, fill #0

## 2024-05-22 MED ORDER — APIXABAN 5 MG PO TABS
5.0000 mg | ORAL_TABLET | Freq: Two times a day (BID) | ORAL | 0 refills | Status: DC
  Filled 2024-05-22: qty 60, 30d supply, fill #0

## 2024-05-22 NOTE — Progress Notes (Signed)
Please see anticoagulation encounter.

## 2024-05-22 NOTE — Patient Instructions (Addendum)
 Description   Called and spoke to pt's sister and instructed for pt to hold warfarin until he gets his INR checked on 6/24. Instructed for pt to eat greens and to seek medical attention if he notices any signs of bleeding.  Call the Coumadin  clinic with any questions [phone].

## 2024-05-27 ENCOUNTER — Ambulatory Visit

## 2024-05-28 ENCOUNTER — Ambulatory Visit: Attending: Internal Medicine

## 2024-05-28 DIAGNOSIS — G459 Transient cerebral ischemic attack, unspecified: Secondary | ICD-10-CM

## 2024-05-28 DIAGNOSIS — Z5181 Encounter for therapeutic drug level monitoring: Secondary | ICD-10-CM | POA: Diagnosis not present

## 2024-05-28 LAB — POCT INR: INR: 1.5 — AB (ref 2.0–3.0)

## 2024-05-28 MED ORDER — APIXABAN 5 MG PO TABS: 5.0000 mg | ORAL_TABLET | Freq: Two times a day (BID) | ORAL | 5 refills | Status: DC

## 2024-05-28 NOTE — Patient Instructions (Signed)
 Description   STOP taking Warfarin and START taking Eliquis  5mg  BID  Coumadin  Clinic with any questions [phone].

## 2024-05-28 NOTE — Progress Notes (Signed)
 Pt switching to Eliquis   Indication: CVA Last office visit: 04/12/23 Basilio)  Scr: 1.42 (03/31/24)  Age: 68 Weight: 72.1kg  Per office visit, follow up in 2 years from 04/12/23. Prescription sent to requested pharmacy.

## 2024-05-29 ENCOUNTER — Encounter

## 2024-06-24 ENCOUNTER — Other Ambulatory Visit: Payer: Self-pay | Admitting: Internal Medicine

## 2024-07-07 ENCOUNTER — Other Ambulatory Visit: Payer: Self-pay

## 2024-07-07 DIAGNOSIS — G459 Transient cerebral ischemic attack, unspecified: Secondary | ICD-10-CM

## 2024-07-07 MED ORDER — APIXABAN 5 MG PO TABS: 5.0000 mg | ORAL_TABLET | Freq: Two times a day (BID) | ORAL | 0 refills | Status: DC

## 2024-07-07 NOTE — Telephone Encounter (Signed)
 Prescription refill request for Eliquis  received. Indication:chf Last office visit:needs appt Scr:na Age: 68 Weight:72.1  kg  Prescription refilled

## 2024-07-17 ENCOUNTER — Encounter (HOSPITAL_COMMUNITY): Payer: Self-pay

## 2024-07-17 ENCOUNTER — Emergency Department (HOSPITAL_COMMUNITY)

## 2024-07-17 ENCOUNTER — Other Ambulatory Visit: Payer: Self-pay

## 2024-07-17 ENCOUNTER — Inpatient Hospital Stay (HOSPITAL_COMMUNITY)
Admission: EM | Admit: 2024-07-17 | Discharge: 2024-07-31 | DRG: 239 | Disposition: A | Discharge status: Skilled Nursing Facility | Attending: Internal Medicine | Admitting: Internal Medicine

## 2024-07-17 DIAGNOSIS — G319 Degenerative disease of nervous system, unspecified: Secondary | ICD-10-CM | POA: Diagnosis not present

## 2024-07-17 DIAGNOSIS — Z955 Presence of coronary angioplasty implant and graft: Secondary | ICD-10-CM | POA: Diagnosis not present

## 2024-07-17 DIAGNOSIS — E1159 Type 2 diabetes mellitus with other circulatory complications: Secondary | ICD-10-CM | POA: Diagnosis present

## 2024-07-17 DIAGNOSIS — D72829 Elevated white blood cell count, unspecified: Secondary | ICD-10-CM | POA: Diagnosis present

## 2024-07-17 DIAGNOSIS — I255 Ischemic cardiomyopathy: Secondary | ICD-10-CM | POA: Diagnosis present

## 2024-07-17 DIAGNOSIS — N400 Enlarged prostate without lower urinary tract symptoms: Secondary | ICD-10-CM | POA: Diagnosis not present

## 2024-07-17 DIAGNOSIS — R1311 Dysphagia, oral phase: Secondary | ICD-10-CM | POA: Diagnosis not present

## 2024-07-17 DIAGNOSIS — I70221 Atherosclerosis of native arteries of extremities with rest pain, right leg: Secondary | ICD-10-CM | POA: Diagnosis present

## 2024-07-17 DIAGNOSIS — E7800 Pure hypercholesterolemia, unspecified: Secondary | ICD-10-CM | POA: Diagnosis present

## 2024-07-17 DIAGNOSIS — I998 Other disorder of circulatory system: Principal | ICD-10-CM | POA: Diagnosis present

## 2024-07-17 DIAGNOSIS — M6281 Muscle weakness (generalized): Secondary | ICD-10-CM | POA: Diagnosis not present

## 2024-07-17 DIAGNOSIS — I69351 Hemiplegia and hemiparesis following cerebral infarction affecting right dominant side: Secondary | ICD-10-CM | POA: Diagnosis not present

## 2024-07-17 DIAGNOSIS — I10 Essential (primary) hypertension: Secondary | ICD-10-CM | POA: Diagnosis not present

## 2024-07-17 DIAGNOSIS — L089 Local infection of the skin and subcutaneous tissue, unspecified: Secondary | ICD-10-CM | POA: Diagnosis not present

## 2024-07-17 DIAGNOSIS — E119 Type 2 diabetes mellitus without complications: Secondary | ICD-10-CM

## 2024-07-17 DIAGNOSIS — E1151 Type 2 diabetes mellitus with diabetic peripheral angiopathy without gangrene: Principal | ICD-10-CM | POA: Diagnosis present

## 2024-07-17 DIAGNOSIS — I70201 Unspecified atherosclerosis of native arteries of extremities, right leg: Secondary | ICD-10-CM | POA: Diagnosis not present

## 2024-07-17 DIAGNOSIS — I2510 Atherosclerotic heart disease of native coronary artery without angina pectoris: Secondary | ICD-10-CM | POA: Diagnosis present

## 2024-07-17 DIAGNOSIS — Z7401 Bed confinement status: Secondary | ICD-10-CM | POA: Diagnosis not present

## 2024-07-17 DIAGNOSIS — R2689 Other abnormalities of gait and mobility: Secondary | ICD-10-CM | POA: Diagnosis not present

## 2024-07-17 DIAGNOSIS — R29818 Other symptoms and signs involving the nervous system: Secondary | ICD-10-CM | POA: Diagnosis not present

## 2024-07-17 DIAGNOSIS — I110 Hypertensive heart disease with heart failure: Secondary | ICD-10-CM | POA: Diagnosis present

## 2024-07-17 DIAGNOSIS — Z7901 Long term (current) use of anticoagulants: Secondary | ICD-10-CM | POA: Diagnosis not present

## 2024-07-17 DIAGNOSIS — N183 Chronic kidney disease, stage 3 unspecified: Secondary | ICD-10-CM | POA: Diagnosis not present

## 2024-07-17 DIAGNOSIS — Z794 Long term (current) use of insulin: Secondary | ICD-10-CM

## 2024-07-17 DIAGNOSIS — R531 Weakness: Secondary | ICD-10-CM | POA: Diagnosis not present

## 2024-07-17 DIAGNOSIS — Z7984 Long term (current) use of oral hypoglycemic drugs: Secondary | ICD-10-CM | POA: Diagnosis not present

## 2024-07-17 DIAGNOSIS — I447 Left bundle-branch block, unspecified: Secondary | ICD-10-CM | POA: Diagnosis present

## 2024-07-17 DIAGNOSIS — E1165 Type 2 diabetes mellitus with hyperglycemia: Secondary | ICD-10-CM | POA: Diagnosis present

## 2024-07-17 DIAGNOSIS — E871 Hypo-osmolality and hyponatremia: Secondary | ICD-10-CM | POA: Diagnosis present

## 2024-07-17 DIAGNOSIS — R000 Tachycardia, unspecified: Secondary | ICD-10-CM | POA: Diagnosis present

## 2024-07-17 DIAGNOSIS — I700 Atherosclerosis of aorta: Secondary | ICD-10-CM | POA: Diagnosis not present

## 2024-07-17 DIAGNOSIS — Z833 Family history of diabetes mellitus: Secondary | ICD-10-CM | POA: Diagnosis not present

## 2024-07-17 DIAGNOSIS — Z87891 Personal history of nicotine dependence: Secondary | ICD-10-CM

## 2024-07-17 DIAGNOSIS — I69354 Hemiplegia and hemiparesis following cerebral infarction affecting left non-dominant side: Secondary | ICD-10-CM | POA: Diagnosis not present

## 2024-07-17 DIAGNOSIS — I6782 Cerebral ischemia: Secondary | ICD-10-CM | POA: Diagnosis not present

## 2024-07-17 DIAGNOSIS — Z8673 Personal history of transient ischemic attack (TIA), and cerebral infarction without residual deficits: Secondary | ICD-10-CM

## 2024-07-17 DIAGNOSIS — I5022 Chronic systolic (congestive) heart failure: Secondary | ICD-10-CM | POA: Diagnosis present

## 2024-07-17 DIAGNOSIS — I4891 Unspecified atrial fibrillation: Secondary | ICD-10-CM | POA: Diagnosis present

## 2024-07-17 DIAGNOSIS — N3289 Other specified disorders of bladder: Secondary | ICD-10-CM | POA: Diagnosis not present

## 2024-07-17 DIAGNOSIS — M79604 Pain in right leg: Secondary | ICD-10-CM | POA: Diagnosis not present

## 2024-07-17 DIAGNOSIS — L02415 Cutaneous abscess of right lower limb: Secondary | ICD-10-CM | POA: Diagnosis not present

## 2024-07-17 DIAGNOSIS — L02412 Cutaneous abscess of left axilla: Secondary | ICD-10-CM | POA: Diagnosis not present

## 2024-07-17 DIAGNOSIS — Z681 Body mass index (BMI) 19 or less, adult: Secondary | ICD-10-CM

## 2024-07-17 DIAGNOSIS — K5900 Constipation, unspecified: Secondary | ICD-10-CM | POA: Diagnosis present

## 2024-07-17 DIAGNOSIS — M62261 Nontraumatic ischemic infarction of muscle, right lower leg: Secondary | ICD-10-CM | POA: Diagnosis not present

## 2024-07-17 DIAGNOSIS — I70202 Unspecified atherosclerosis of native arteries of extremities, left leg: Secondary | ICD-10-CM | POA: Diagnosis not present

## 2024-07-17 DIAGNOSIS — Z79899 Other long term (current) drug therapy: Secondary | ICD-10-CM | POA: Diagnosis not present

## 2024-07-17 DIAGNOSIS — M62271 Nontraumatic ischemic infarction of muscle, right ankle and foot: Secondary | ICD-10-CM | POA: Diagnosis not present

## 2024-07-17 DIAGNOSIS — R41841 Cognitive communication deficit: Secondary | ICD-10-CM | POA: Diagnosis not present

## 2024-07-17 DIAGNOSIS — Z9889 Other specified postprocedural states: Secondary | ICD-10-CM | POA: Diagnosis not present

## 2024-07-17 DIAGNOSIS — S78112A Complete traumatic amputation at level between left hip and knee, initial encounter: Secondary | ICD-10-CM | POA: Diagnosis not present

## 2024-07-17 DIAGNOSIS — Z832 Family history of diseases of the blood and blood-forming organs and certain disorders involving the immune mechanism: Secondary | ICD-10-CM

## 2024-07-17 DIAGNOSIS — M79671 Pain in right foot: Secondary | ICD-10-CM | POA: Diagnosis not present

## 2024-07-17 DIAGNOSIS — Z823 Family history of stroke: Secondary | ICD-10-CM

## 2024-07-17 DIAGNOSIS — E43 Unspecified severe protein-calorie malnutrition: Secondary | ICD-10-CM | POA: Diagnosis present

## 2024-07-17 DIAGNOSIS — B351 Tinea unguium: Secondary | ICD-10-CM | POA: Diagnosis not present

## 2024-07-17 DIAGNOSIS — L859 Epidermal thickening, unspecified: Secondary | ICD-10-CM | POA: Diagnosis not present

## 2024-07-17 DIAGNOSIS — I7090 Unspecified atherosclerosis: Secondary | ICD-10-CM | POA: Diagnosis not present

## 2024-07-17 DIAGNOSIS — Z9079 Acquired absence of other genital organ(s): Secondary | ICD-10-CM

## 2024-07-17 DIAGNOSIS — E785 Hyperlipidemia, unspecified: Secondary | ICD-10-CM

## 2024-07-17 LAB — I-STAT CG4 LACTIC ACID, ED
Lactic Acid, Venous: 1.3 mmol/L (ref 0.5–1.9)
Lactic Acid, Venous: 1.3 mmol/L (ref 0.5–1.9)

## 2024-07-17 LAB — BASIC METABOLIC PANEL WITH GFR
Anion gap: 14 (ref 5–15)
BUN: 21 mg/dL (ref 8–23)
CO2: 22 mmol/L (ref 22–32)
Calcium: 9.3 mg/dL (ref 8.9–10.3)
Chloride: 99 mmol/L (ref 98–111)
Creatinine, Ser: 1.19 mg/dL (ref 0.61–1.24)
GFR, Estimated: >60 mL/min (ref >60)
Glucose, Bld: 136 mg/dL — ABNORMAL HIGH (ref 70–99)
Potassium: 4.7 mmol/L (ref 3.5–5.1)
Sodium: 135 mmol/L (ref 135–145)

## 2024-07-17 LAB — CBC
HCT: 42.5 % (ref 39.0–52.0)
Hemoglobin: 13 g/dL (ref 13.0–17.0)
MCH: 24.9 pg — ABNORMAL LOW (ref 26.0–34.0)
MCHC: 30.6 g/dL (ref 30.0–36.0)
MCV: 81.3 fL (ref 80.0–100.0)
Platelets: 477 K/uL — ABNORMAL HIGH (ref 150–400)
RBC: 5.23 MIL/uL (ref 4.22–5.81)
RDW: 16.3 % — ABNORMAL HIGH (ref 11.5–15.5)
WBC: 16.4 K/uL — ABNORMAL HIGH (ref 4.0–10.5)
nRBC: 0 % (ref 0.0–0.2)

## 2024-07-17 LAB — PROTIME-INR
INR: 1.6 — ABNORMAL HIGH (ref 0.8–1.2)
Prothrombin Time: 20.1 s — ABNORMAL HIGH (ref 11.4–15.2)

## 2024-07-17 LAB — APTT: aPTT: 46 s — ABNORMAL HIGH (ref 24–36)

## 2024-07-17 LAB — CK: Total CK: 1583 U/L — ABNORMAL HIGH (ref 49–397)

## 2024-07-17 MED ORDER — IOHEXOL 350 MG/ML SOLN
100.0000 mL | Freq: Once | INTRAVENOUS | Status: AC | PRN
  Administered 2024-07-17: 100 mL via INTRAVENOUS

## 2024-07-17 NOTE — ED Provider Triage Note (Signed)
 Emergency Medicine Provider Triage Evaluation Note  Karl [name] , a 68 y.o. male  was evaluated in triage.  Pt complains of pain, swelling, cold sensation, burning of right lower extremity intermittently for a week.  Review of Systems  Positive: Painful, cold, swollen extremity Negative:   Physical Exam  BP 104/72 (BP Location: Right Arm)   Pulse (!) 104   Temp 98.4 F (36.9 C)   Resp 16   SpO2 100%  Gen:   Awake, no distress   Resp:  Normal effort  MSK:   Moves extremities without difficulty  Other:  Obviously swollen, dusky appearance of right lower extremity from mid tib-fib down, question palpable DP, but no palpable PT pulse.  Not able to find a Doppler pulse of either DP or PT pulse.  Medical Decision Making  Medically screening exam initiated at 8:42 PM.  Appropriate orders placed.  RODMAN RECUPERO was informed that the remainder of the evaluation will be completed by another provider, this initial triage assessment does not replace that evaluation, and the importance of remaining in the ED until their evaluation is complete.  Concern for critical limb ischemia, charge nurse informed next available room, will order CT runoff   Rosan Sherlean DEL, PA-C 07/17/24 2042

## 2024-07-17 NOTE — ED Notes (Signed)
 Went to room to collect ordered labs, patient off unit in radiology.

## 2024-07-17 NOTE — ED Notes (Signed)
 Doppler used to assess for pedal pulse in right LE, no pulse found, MD Horton notified.

## 2024-07-17 NOTE — ED Provider Notes (Addendum)
 [place] [facility] Provider Note   CSN: [id number] Arrival date & time: 07/17/24  1952     Patient presents with: R Lower leg pain/swelling   LYNARD POSTLEWAIT is a 68 y.o. male.   Patient complains of pain in his right foot and his right leg.  Patient reports symptoms began 1 week ago.  Patient reports that he has a toenail that has been giving him problems for a long time.  Patient reports he has had increasing pain in his foot and pain up the back of his left lower leg.  Patient complains that his foot feels cold.  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  The history is provided by the patient and a relative.       Prior to Admission medications   Medication Sig Start Date End Date Taking? Authorizing Provider  acetaminophen  (TYLENOL ) 325 MG tablet Take 1-2 tablets (325-650 mg total) by mouth every 4 (four) hours as needed for mild pain. 07/09/18   Love, Sharlet RAMAN, PA-C  apixaban  (ELIQUIS ) 5 MG TABS tablet Take 1 tablet (5 mg total) by mouth 2 (two) times daily. Needs Cardiology appt for refills.  Call office [phone].  Thank you..Take 1 tablet (5 mg total) by mouth 2 (two) times daily. 07/07/24   Waddell Danelle ORN, MD  carvedilol  (COREG ) 3.125 MG tablet TAKE 1 TABLET BY MOUTH TWICE DAILY WITH A MEAL 06/24/24   Burns, Glade PARAS, MD  empagliflozin  (JARDIANCE ) 10 MG TABS tablet Take 1 tablet (10 mg total) by mouth daily before breakfast. 05/16/24   Geofm Glade PARAS, MD  Lancets 30G MISC Use to check blood sugars daily 07/15/18   Burns, Glade PARAS, MD  losartan  (COZAAR ) 25 MG tablet TAKE 1 TABLET BY MOUTH ONCE DAILY **FOLLOW  UP  APPOINTMENT  DUE  IN  OCTOBER  MUST  SEE  PROVIDER  FOR  FUTURE  REFILLS** 05/16/24   Geofm Glade PARAS, MD  metFORMIN  (GLUCOPHAGE ) 1000 MG tablet Take 1 tablet (1,000 mg total) by mouth  2 (two) times daily with a meal. 05/16/24   Burns, Glade PARAS, MD  potassium chloride  (KLOR-CON  M) 10 MEQ tablet Take 1 tablet (10 mEq total) by mouth 2 (two) times daily. 05/16/24   Geofm Glade PARAS, MD  rosuvastatin  (CRESTOR ) 20 MG tablet Take 1 tablet (20 mg total) by mouth daily. 05/16/24   Geofm Glade PARAS, MD  rivaroxaban  (XARELTO ) 20 MG TABS tablet Take 1 tablet (20 mg total) by mouth daily. 05/22/24 05/22/24      Allergies: Lipitor [atorvastatin]    Review of Systems  All other systems reviewed and are negative.   Updated Vital Signs BP 116/74   Pulse 91   Temp 98.3 F (36.8 C) (Oral)   Resp 16   SpO2 100%   Physical Exam Vitals and nursing note reviewed.  Constitutional:      Appearance: He is well-developed.  HENT:     Head: Normocephalic.  Cardiovascular:     Rate and Rhythm: Normal rate.  Pulmonary:     Effort: Pulmonary effort is normal.  Abdominal:     General: There is no distension.  Musculoskeletal:     Cervical back: Normal range of motion.     Comments: Discolored right foot, no pulse present cool to mid shin, decreased sensation toes and foot, toes appear dark.  Skin:    General: Skin is warm.  Neurological:     General: No focal deficit present.     Mental Status: He is alert and oriented to person, place, and time.     (all labs ordered are listed, but only abnormal results are displayed) Labs Reviewed  CBC - Abnormal; Notable for the following components:      Result Value   WBC 16.4 (*)    MCH 24.9 (*)    RDW 16.3 (*)    Platelets 477 (*)    All other components within normal limits  BASIC METABOLIC PANEL WITH GFR - Abnormal; Notable for the following components:   Glucose, Bld 136 (*)    All other components within normal limits  PROTIME-INR - Abnormal; Notable for the following components:   Prothrombin Time 20.1 (*)    INR 1.6 (*)    All other components within normal limits  APTT - Abnormal; Notable for the following components:   aPTT 46  (*)    All other components within normal limits  CK - Abnormal; Notable for the following components:   Total CK 1,583 (*)    All other components within normal limits  I-STAT CG4 LACTIC ACID, ED  I-STAT CG4 LACTIC ACID, ED    EKG: None  Radiology: No results found.   Procedures   Medications Ordered in the ED  iohexol  (OMNIPAQUE ) 350 MG/ML injection 100 mL (100 mLs Intravenous Contrast Given 07/17/24 2239)                                    Medical Decision Making Patient complains of pain in his right foot.  Patient reports his foot has been feeling cold for the past week  Amount and/or Complexity of Data Reviewed Independent Historian:     Details: Patient's family helps provide history Labs: ordered. Decision-making details documented in ED Course.    Details: Patient has a white blood cell count of 16.4. Radiology: ordered and independent interpretation performed. Decision-making details documented in ED Course.    Details: CT ordered and is pending. Discussion of management or test interpretation with external provider(s): I discussed the patient with Dr. Serene who will see the patient here.  He advised pt will need hospitalist admission.  He advised hold blood thinner.          Final diagnoses:  Ischemic foot    ED Discharge Orders     None          Flint Sonny POUR, [place] 07/17/24 2315    Bari Roxie HERO, DO 07/17/24 2343    Flint Sonny POUR, PA-C 07/18/24 0012    Flint Sonny POUR, PA-C 07/18/24 0026    Bari Roxie HERO, DO 07/26/24 [phone]

## 2024-07-17 NOTE — ED Triage Notes (Signed)
 Patient reports persistent swelling/pain at right lower leg/foot onset last week , denies injury or fall .

## 2024-07-18 ENCOUNTER — Inpatient Hospital Stay (HOSPITAL_COMMUNITY)

## 2024-07-18 ENCOUNTER — Encounter (HOSPITAL_COMMUNITY): Payer: Self-pay | Admitting: Internal Medicine

## 2024-07-18 DIAGNOSIS — I6782 Cerebral ischemia: Secondary | ICD-10-CM | POA: Diagnosis not present

## 2024-07-18 DIAGNOSIS — I998 Other disorder of circulatory system: Secondary | ICD-10-CM | POA: Diagnosis present

## 2024-07-18 DIAGNOSIS — I69354 Hemiplegia and hemiparesis following cerebral infarction affecting left non-dominant side: Secondary | ICD-10-CM | POA: Diagnosis not present

## 2024-07-18 DIAGNOSIS — I255 Ischemic cardiomyopathy: Secondary | ICD-10-CM | POA: Diagnosis present

## 2024-07-18 DIAGNOSIS — Z8673 Personal history of transient ischemic attack (TIA), and cerebral infarction without residual deficits: Secondary | ICD-10-CM

## 2024-07-18 DIAGNOSIS — S78112A Complete traumatic amputation at level between left hip and knee, initial encounter: Secondary | ICD-10-CM | POA: Diagnosis not present

## 2024-07-18 DIAGNOSIS — Z9889 Other specified postprocedural states: Secondary | ICD-10-CM | POA: Diagnosis not present

## 2024-07-18 DIAGNOSIS — R41841 Cognitive communication deficit: Secondary | ICD-10-CM | POA: Diagnosis not present

## 2024-07-18 DIAGNOSIS — E871 Hypo-osmolality and hyponatremia: Secondary | ICD-10-CM | POA: Diagnosis present

## 2024-07-18 DIAGNOSIS — I7090 Unspecified atherosclerosis: Secondary | ICD-10-CM | POA: Diagnosis not present

## 2024-07-18 DIAGNOSIS — Z7984 Long term (current) use of oral hypoglycemic drugs: Secondary | ICD-10-CM | POA: Diagnosis not present

## 2024-07-18 DIAGNOSIS — Z681 Body mass index (BMI) 19 or less, adult: Secondary | ICD-10-CM | POA: Diagnosis not present

## 2024-07-18 DIAGNOSIS — R29818 Other symptoms and signs involving the nervous system: Secondary | ICD-10-CM | POA: Diagnosis not present

## 2024-07-18 DIAGNOSIS — B351 Tinea unguium: Secondary | ICD-10-CM | POA: Diagnosis not present

## 2024-07-18 DIAGNOSIS — R2689 Other abnormalities of gait and mobility: Secondary | ICD-10-CM | POA: Diagnosis not present

## 2024-07-18 DIAGNOSIS — E1165 Type 2 diabetes mellitus with hyperglycemia: Secondary | ICD-10-CM | POA: Diagnosis present

## 2024-07-18 DIAGNOSIS — N183 Chronic kidney disease, stage 3 unspecified: Secondary | ICD-10-CM | POA: Diagnosis not present

## 2024-07-18 DIAGNOSIS — E43 Unspecified severe protein-calorie malnutrition: Secondary | ICD-10-CM | POA: Diagnosis present

## 2024-07-18 DIAGNOSIS — R531 Weakness: Secondary | ICD-10-CM | POA: Diagnosis not present

## 2024-07-18 DIAGNOSIS — E1159 Type 2 diabetes mellitus with other circulatory complications: Secondary | ICD-10-CM | POA: Diagnosis not present

## 2024-07-18 DIAGNOSIS — Z955 Presence of coronary angioplasty implant and graft: Secondary | ICD-10-CM | POA: Diagnosis not present

## 2024-07-18 DIAGNOSIS — K5900 Constipation, unspecified: Secondary | ICD-10-CM | POA: Diagnosis present

## 2024-07-18 DIAGNOSIS — E7800 Pure hypercholesterolemia, unspecified: Secondary | ICD-10-CM | POA: Diagnosis present

## 2024-07-18 DIAGNOSIS — I2510 Atherosclerotic heart disease of native coronary artery without angina pectoris: Secondary | ICD-10-CM | POA: Diagnosis present

## 2024-07-18 DIAGNOSIS — I70221 Atherosclerosis of native arteries of extremities with rest pain, right leg: Secondary | ICD-10-CM | POA: Diagnosis present

## 2024-07-18 DIAGNOSIS — M6281 Muscle weakness (generalized): Secondary | ICD-10-CM | POA: Diagnosis not present

## 2024-07-18 DIAGNOSIS — N400 Enlarged prostate without lower urinary tract symptoms: Secondary | ICD-10-CM | POA: Diagnosis not present

## 2024-07-18 DIAGNOSIS — I69351 Hemiplegia and hemiparesis following cerebral infarction affecting right dominant side: Secondary | ICD-10-CM | POA: Diagnosis not present

## 2024-07-18 DIAGNOSIS — I5022 Chronic systolic (congestive) heart failure: Secondary | ICD-10-CM | POA: Diagnosis present

## 2024-07-18 DIAGNOSIS — M62261 Nontraumatic ischemic infarction of muscle, right lower leg: Secondary | ICD-10-CM | POA: Diagnosis not present

## 2024-07-18 DIAGNOSIS — Z7901 Long term (current) use of anticoagulants: Secondary | ICD-10-CM | POA: Diagnosis not present

## 2024-07-18 DIAGNOSIS — Z794 Long term (current) use of insulin: Secondary | ICD-10-CM | POA: Diagnosis not present

## 2024-07-18 DIAGNOSIS — D72829 Elevated white blood cell count, unspecified: Secondary | ICD-10-CM | POA: Diagnosis present

## 2024-07-18 DIAGNOSIS — G319 Degenerative disease of nervous system, unspecified: Secondary | ICD-10-CM | POA: Diagnosis not present

## 2024-07-18 DIAGNOSIS — Z79899 Other long term (current) drug therapy: Secondary | ICD-10-CM | POA: Diagnosis not present

## 2024-07-18 DIAGNOSIS — E1151 Type 2 diabetes mellitus with diabetic peripheral angiopathy without gangrene: Secondary | ICD-10-CM | POA: Diagnosis present

## 2024-07-18 DIAGNOSIS — Z833 Family history of diabetes mellitus: Secondary | ICD-10-CM | POA: Diagnosis not present

## 2024-07-18 DIAGNOSIS — I110 Hypertensive heart disease with heart failure: Secondary | ICD-10-CM | POA: Diagnosis present

## 2024-07-18 DIAGNOSIS — Z87891 Personal history of nicotine dependence: Secondary | ICD-10-CM | POA: Diagnosis not present

## 2024-07-18 DIAGNOSIS — I4891 Unspecified atrial fibrillation: Secondary | ICD-10-CM | POA: Diagnosis present

## 2024-07-18 DIAGNOSIS — I70202 Unspecified atherosclerosis of native arteries of extremities, left leg: Secondary | ICD-10-CM | POA: Diagnosis not present

## 2024-07-18 DIAGNOSIS — I10 Essential (primary) hypertension: Secondary | ICD-10-CM | POA: Diagnosis not present

## 2024-07-18 DIAGNOSIS — R1311 Dysphagia, oral phase: Secondary | ICD-10-CM | POA: Diagnosis not present

## 2024-07-18 LAB — CBC WITH DIFFERENTIAL/PLATELET
Abs Immature Granulocytes: 0.11 K/uL — ABNORMAL HIGH (ref 0.00–0.07)
Basophils Absolute: 0 K/uL (ref 0.0–0.1)
Basophils Relative: 0 %
Eosinophils Absolute: 0 K/uL (ref 0.0–0.5)
Eosinophils Relative: 0 %
HCT: 41.8 % (ref 39.0–52.0)
Hemoglobin: 13 g/dL (ref 13.0–17.0)
Immature Granulocytes: 1 %
Lymphocytes Relative: 8 %
Lymphs Abs: 1.3 K/uL (ref 0.7–4.0)
MCH: 24.8 pg — ABNORMAL LOW (ref 26.0–34.0)
MCHC: 31.1 g/dL (ref 30.0–36.0)
MCV: 79.6 fL — ABNORMAL LOW (ref 80.0–100.0)
Monocytes Absolute: 1.7 K/uL — ABNORMAL HIGH (ref 0.1–1.0)
Monocytes Relative: 10 %
Neutro Abs: 14 K/uL — ABNORMAL HIGH (ref 1.7–7.7)
Neutrophils Relative %: 81 %
Platelets: 495 K/uL — ABNORMAL HIGH (ref 150–400)
RBC: 5.25 MIL/uL (ref 4.22–5.81)
RDW: 16.2 % — ABNORMAL HIGH (ref 11.5–15.5)
WBC: 17.1 K/uL — ABNORMAL HIGH (ref 4.0–10.5)
nRBC: 0 % (ref 0.0–0.2)

## 2024-07-18 LAB — BASIC METABOLIC PANEL WITH GFR
Anion gap: 13 (ref 5–15)
BUN: 21 mg/dL (ref 8–23)
CO2: 22 mmol/L (ref 22–32)
Calcium: 9.7 mg/dL (ref 8.9–10.3)
Chloride: 99 mmol/L (ref 98–111)
Creatinine, Ser: 1.22 mg/dL (ref 0.61–1.24)
GFR, Estimated: >60 mL/min (ref >60)
Glucose, Bld: 118 mg/dL — ABNORMAL HIGH (ref 70–99)
Potassium: 4.8 mmol/L (ref 3.5–5.1)
Sodium: 134 mmol/L — ABNORMAL LOW (ref 135–145)

## 2024-07-18 LAB — GLUCOSE, CAPILLARY
Glucose-Capillary: 112 mg/dL — ABNORMAL HIGH (ref 70–99)
Glucose-Capillary: 154 mg/dL — ABNORMAL HIGH (ref 70–99)
Glucose-Capillary: 124 mg/dL — ABNORMAL HIGH (ref 70–99)
Glucose-Capillary: 101 mg/dL — ABNORMAL HIGH (ref 70–99)

## 2024-07-18 LAB — HIV ANTIBODY (ROUTINE TESTING W REFLEX): HIV Screen 4th Generation wRfx: NONREACTIVE

## 2024-07-18 LAB — HEMOGLOBIN A1C
Hgb A1c MFr Bld: 5.9 % — ABNORMAL HIGH (ref 4.8–5.6)
Mean Plasma Glucose: 122.63 mg/dL

## 2024-07-18 LAB — APTT
aPTT: 43 s — ABNORMAL HIGH (ref 24–36)
aPTT: 92 s — ABNORMAL HIGH (ref 24–36)

## 2024-07-18 LAB — MRSA NEXT GEN BY PCR, NASAL: MRSA by PCR Next Gen: NOT DETECTED

## 2024-07-18 LAB — HEPARIN LEVEL (UNFRACTIONATED): Heparin Unfractionated: >1.1 [IU]/mL — ABNORMAL HIGH (ref 0.30–0.70)

## 2024-07-18 MED ORDER — LOSARTAN POTASSIUM 25 MG PO TABS
25.0000 mg | ORAL_TABLET | Freq: Every day | ORAL | Status: DC
  Administered 2024-07-18 – 2024-07-25 (×8): 25 mg via ORAL
  Filled 2024-07-18 (×8): qty 1

## 2024-07-18 MED ORDER — SODIUM CHLORIDE 0.9 % IV SOLN
2.0000 g | Freq: Two times a day (BID) | INTRAVENOUS | Status: DC
  Administered 2024-07-18 – 2024-07-20 (×4): 2 g via INTRAVENOUS
  Filled 2024-07-18 (×4): qty 12.5

## 2024-07-18 MED ORDER — SODIUM CHLORIDE 0.9 % IV SOLN: 2.0000 g | Freq: Three times a day (TID) | INTRAVENOUS | Status: DC

## 2024-07-18 MED ORDER — HEPARIN (PORCINE) 25000 UT/250ML-% IV SOLN
1450.0000 [IU]/h | INTRAVENOUS | Status: DC
  Administered 2024-07-18: 1200 [IU]/h via INTRAVENOUS
  Administered 2024-07-18 – 2024-07-21 (×2): 1450 [IU]/h via INTRAVENOUS
  Filled 2024-07-18 (×5): qty 250

## 2024-07-18 MED ORDER — INSULIN ASPART 100 UNIT/ML IJ SOLN
0.0000 [IU] | Freq: Three times a day (TID) | INTRAMUSCULAR | Status: DC
  Administered 2024-07-18: 1 [IU] via SUBCUTANEOUS
  Administered 2024-07-18: 2 [IU] via SUBCUTANEOUS
  Administered 2024-07-19: 1 [IU] via SUBCUTANEOUS
  Administered 2024-07-20: 3 [IU] via SUBCUTANEOUS
  Administered 2024-07-22: 1 [IU] via SUBCUTANEOUS
  Administered 2024-07-22: 2 [IU] via SUBCUTANEOUS
  Administered 2024-07-23 – 2024-07-24 (×3): 1 [IU] via SUBCUTANEOUS
  Administered 2024-07-24: 2 [IU] via SUBCUTANEOUS
  Administered 2024-07-25 (×2): 1 [IU] via SUBCUTANEOUS
  Administered 2024-07-25: 3 [IU] via SUBCUTANEOUS
  Administered 2024-07-26 (×3): 1 [IU] via SUBCUTANEOUS
  Administered 2024-07-27: 3 [IU] via SUBCUTANEOUS
  Administered 2024-07-27 – 2024-07-28 (×2): 1 [IU] via SUBCUTANEOUS
  Administered 2024-07-28 – 2024-07-29 (×3): 2 [IU] via SUBCUTANEOUS
  Administered 2024-07-29: 1 [IU] via SUBCUTANEOUS
  Administered 2024-07-30: 2 [IU] via SUBCUTANEOUS
  Administered 2024-07-30 – 2024-07-31 (×3): 1 [IU] via SUBCUTANEOUS

## 2024-07-18 MED ORDER — ROSUVASTATIN CALCIUM 20 MG PO TABS
20.0000 mg | ORAL_TABLET | Freq: Every day | ORAL | Status: DC
  Administered 2024-07-18 – 2024-07-23 (×6): 20 mg via ORAL
  Filled 2024-07-18 (×6): qty 1

## 2024-07-18 MED ORDER — CARVEDILOL 3.125 MG PO TABS
3.1250 mg | ORAL_TABLET | Freq: Two times a day (BID) | ORAL | Status: DC
  Administered 2024-07-18 – 2024-07-24 (×14): 3.125 mg via ORAL
  Filled 2024-07-18 (×15): qty 1

## 2024-07-18 NOTE — Progress Notes (Signed)
 PHARMACY - ANTICOAGULATION CONSULT NOTE  Pharmacy Consult for heparin  Indication: ischemic limb  Allergies  Allergen Reactions   Lipitor [Atorvastatin] Nausea And Vomiting    Patient Measurements: Height: 6' 2 (188 cm) Weight: 72.1 kg (158 lb 15.2 oz) (Wt from 05/28/2024) IBW/kg (Calculated) : 82.2 HEPARIN  DW (KG): 72.1  Vital Signs: Temp: 98.1 F (36.7 C) (08/15 0819) Temp Source: Oral (08/15 0819) BP: 132/89 (08/15 0819) Pulse Rate: 97 (08/15 0413)  Labs: Recent Labs    07/17/24 2047 07/18/24 0817  HGB 13.0 13.0  HCT 42.5 41.8  PLT 477* 495*  APTT 46* 43*  LABPROT 20.1*  --   INR 1.6*  --   HEPARINUNFRC  --  >1.10*  CREATININE 1.19 1.22  CKTOTAL 1,583*  --     Estimated Creatinine Clearance: 58.3 mL/min (by C-G formula based on SCr of 1.22 mg/dL).  Medications:  -Eliquis  5mg  PO BID (LD 8/14 AM)  Assessment: 22 yoM presented to ED with right foot pain. Pharmacy consulted to dose heparin  for ischemic limb.   -aPTT is subtherapeutic at 43 sec on 1200 units/hr. As expected, heparin  level is >1.1 from effects of recent apixaban . No bleeding noted, CBC is stable. Per RN, no problems with bleeding. She reports heparin  drip off surrounding MRI which appears to be done ~7:30 and off for 30 min.  Goal of Therapy:  Heparin  level 0.3-0.7 units/ml aPTT 66-102 seconds Monitor platelets by anticoagulation protocol: Yes   Plan:  Increase heparin  infusion to 1450 units/hr Check aPTT in 8 hours Daily aPTT, heparin  level, CBC Monitor with aPTT until correlates with heparin  level Monitor for s/sx of bleeding  Thank you for involving pharmacy in this patient's care.  Delon Sax, PharmD, BCPS Clinical Pharmacist Clinical phone for 07/18/2024 is [phone] 07/18/2024 9:09 AM

## 2024-07-18 NOTE — Progress Notes (Signed)
 Attempted to see pt, however, he is off the floor at this time.  Plan for angiogram on Monday.    Lucie Apt, [facility] 07/18/2024 7:31 AM

## 2024-07-18 NOTE — ED Provider Notes (Signed)
  Physical Exam  BP 116/74   Pulse 91   Temp 98.3 F (36.8 C) (Oral)   Resp 16   SpO2 100%   Physical Exam  Procedures  Procedures  ED Course / MDM    Medical Decision Making  Patient care assumed at shift handoff with plans for hospitalist admission.  Patient with ischemic right limb for the past week.  Vascular surgery plans to do study on Monday.  Request heparin  administration to allow Eliquis  to washout over the weekend.  Heparin  ordered.  Dr. Franky agrees to see patient for admission       Logan Ubaldo KATHEE DEVONNA 07/18/24 9964    Trine Raynell Moder, MD 07/18/24 [phone]

## 2024-07-18 NOTE — H&P (Signed)
 History and Physical    Karl [name] FMW:[id number] DOB: [birth year] DOA: 07/17/2024  Patient coming from: Home.  Chief Complaint: Right foot pain.  HPI: Karl [name] is a 68 y.o. male with history of CVA with left-sided weakness, cardiomyopathy, diabetes mellitus type 2 CAD presents to the ER with complaints of right foot pain.  Patient states he has been having right foot pain for the last 2 weeks and has been having difficulty moving the right lower extremity because of the pain.  Usually ambulates with the help of walker and last 2 weeks he has been using the wheelchair.  ED Course: In the ER patient had CT angiogram of the lower extremities with poor visualization of the arteries.  Dr. Serene vascular surgeon was consulted.  Patient's symptoms are concerning for ischemic right foot and was started on heparin  infusion and admitted for further workup.  Labs show WBC of 16.4 lactic acid of 1.3 CK of 1500.  Review of Systems: As per HPI, rest all negative.   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 Waddell 15 years 1998   Dyspnea    Hypertension    Stroke [facility]) 2019   denies residual on 06/14/2018   TIA (transient ischemic attack) 12/19/2017   Type II diabetes mellitus (HCC) 07/2014 dx    Past Surgical History:  Procedure Laterality Date   CORONARY ANGIOPLASTY WITH STENT PLACEMENT  1998   FRACTURE SURGERY     GREEN LIGHT LASER TURP (TRANSURETHRAL RESECTION OF PROSTATE N/A 06/05/2018   Procedure: GREEN LIGHT LASER TURP , TRANSURETHRAL RESECTION OF PROSTATE;  Surgeon: Carolee Sherwood JONETTA DOUGLAS, MD;  Location: [facility];  Service: Urology;  Laterality: N/A;   TIBIA FRACTURE SURGERY Left 1980s     reports that he quit smoking about 23 years ago. His smoking use included cigarettes. He started smoking about 28 years ago. He has a 10 pack-year smoking  history. He has never used smokeless tobacco. He reports that he does not currently use alcohol. He reports that he does not currently use drugs.  Allergies  Allergen Reactions   Lipitor [Atorvastatin] Nausea And Vomiting    Family History  Problem Relation Age of Onset   Diabetes Mother    Diabetes Father    Stroke Father 3   Diabetes Sister    Clotting disorder Sister 55   Diabetes Other        nephew   Diabetes Brother    Diabetes Maternal Grandmother    Colon cancer Neg Hx    Esophageal cancer Neg Hx    Pancreatic cancer Neg Hx    Stomach cancer Neg Hx    Liver disease Neg Hx     Prior to Admission medications   Medication Sig Start Date End Date Taking? Authorizing Provider  acetaminophen  (TYLENOL ) 325 MG tablet Take 1-2 tablets (325-650 mg total) by mouth every 4 (four) hours as needed for mild pain. 07/09/18   Love, Sharlet RAMAN, PA-C  apixaban  (ELIQUIS ) 5 MG TABS tablet Take 1 tablet (5 mg total) by mouth 2 (two) times daily. Needs Cardiology appt for refills.  Call office [phone].  Thank you..Take 1 tablet (5 mg total) by mouth 2 (two) times daily. 07/07/24   Waddell Danelle ORN, MD  carvedilol  (COREG ) 3.125 MG tablet TAKE 1 TABLET BY MOUTH TWICE DAILY WITH A  MEAL 06/24/24   Geofm Glade PARAS, MD  empagliflozin  (JARDIANCE ) 10 MG TABS tablet Take 1 tablet (10 mg total) by mouth daily before breakfast. 05/16/24   Geofm Glade PARAS, MD  Lancets 30G MISC Use to check blood sugars daily 07/15/18   Burns, Glade PARAS, MD  losartan  (COZAAR ) 25 MG tablet TAKE 1 TABLET BY MOUTH ONCE DAILY **FOLLOW  UP  APPOINTMENT  DUE  IN  OCTOBER  MUST  SEE  PROVIDER  FOR  FUTURE  REFILLS** 05/16/24   Geofm Glade PARAS, MD  metFORMIN  (GLUCOPHAGE ) 1000 MG tablet Take 1 tablet (1,000 mg total) by mouth 2 (two) times daily with a meal. 05/16/24   Burns, Glade PARAS, MD  potassium chloride  (KLOR-CON  M) 10 MEQ tablet Take 1 tablet (10 mEq total) by mouth 2 (two) times daily. 05/16/24   Geofm Glade PARAS, MD  rosuvastatin  (CRESTOR )  20 MG tablet Take 1 tablet (20 mg total) by mouth daily. 05/16/24   Geofm Glade PARAS, MD  rivaroxaban  (XARELTO ) 20 MG TABS tablet Take 1 tablet (20 mg total) by mouth daily. 05/22/24 05/22/24      Physical Exam: Constitutional: Moderately built and nourished. Vitals:   07/18/24 0045 07/18/24 0100 07/18/24 0118 07/18/24 0413  BP: 129/77 116/72 (!) 144/87 134/83  Pulse: 87 93 100 97  Resp: (!) 21 (!) 22 19 14   Temp: 98.9 F (37.2 C)  98.7 F (37.1 C) 99.1 F (37.3 C)  TempSrc:   Oral Oral  SpO2: 100% 100% 99% 92%  Weight:      Height:       Eyes: Anicteric no pallor. ENMT: No discharge from the ears eyes nose or mouth. Neck: No mass felt.  No neck rigidity. Respiratory: No rhonchi or crepitations. Cardiovascular: S1-S2 heard. Abdomen: Soft nontender bowel sounds present. Musculoskeletal: Edema of the right foot.  Poor pulses in the right lower extremity. Skin: No rash. Neurologic: Alert awake oriented to time place and person.  Has left-sided upper and lower extremity weakness 4 x 5.  Right lower extremity is around 4 x 5.  Right upper extremities 5 x 5. Psychiatric: Appears normal.  Normal affect.   Labs on Admission: I have personally reviewed following labs and imaging studies  CBC: Recent Labs  Lab 07/17/24 2047  WBC 16.4*  HGB 13.0  HCT 42.5  MCV 81.3  PLT 477*   Basic Metabolic Panel: Recent Labs  Lab 07/17/24 2047  NA 135  K 4.7  CL 99  CO2 22  GLUCOSE 136*  BUN 21  CREATININE 1.19  CALCIUM  9.3   GFR: Estimated Creatinine Clearance: 59.7 mL/min (by C-G formula based on SCr of 1.19 mg/dL). Liver Function Tests: No results for input(s): AST, ALT, ALKPHOS, BILITOT, PROT, ALBUMIN  in the last 168 hours. No results for input(s): LIPASE, AMYLASE in the last 168 hours. No results for input(s): AMMONIA in the last 168 hours. Coagulation Profile: Recent Labs  Lab 07/17/24 2047  INR 1.6*   Cardiac Enzymes: Recent Labs  Lab 07/17/24 2047   CKTOTAL 1,583*   BNP (last 3 results) No results for input(s): PROBNP in the last 8760 hours. HbA1C: No results for input(s): HGBA1C in the last 72 hours. CBG: No results for input(s): GLUCAP in the last 168 hours. Lipid Profile: No results for input(s): CHOL, HDL, LDLCALC, TRIG, CHOLHDL, LDLDIRECT in the last 72 hours. Thyroid  Function Tests: No results for input(s): TSH, T4TOTAL, FREET4, T3FREE, THYROIDAB in the last 72 hours. Anemia Panel: No results for input(s):  VITAMINB12, FOLATE, FERRITIN, TIBC, IRON, RETICCTPCT in the last 72 hours. Urine analysis:    Component Value Date/Time   COLORURINE YELLOW 06/29/2022 1821   APPEARANCEUR CLOUDY (A) 06/29/2022 1821   LABSPEC 1.029 06/29/2022 1821   PHURINE 6.0 06/29/2022 1821   GLUCOSEU >=500 (A) 06/29/2022 1821   HGBUR MODERATE (A) 06/29/2022 1821   BILIRUBINUR NEGATIVE 06/29/2022 1821   BILIRUBINUR moderate 07/09/2014 1428   KETONESUR 20 (A) 06/29/2022 1821   PROTEINUR 30 (A) 06/29/2022 1821   UROBILINOGEN 0.2 08/07/2015 0854   NITRITE NEGATIVE 06/29/2022 1821   LEUKOCYTESUR LARGE (A) 06/29/2022 1821   Sepsis Labs: @LABRCNTIP (procalcitonin:4,lacticidven:4) )No results found for this or any previous visit (from the past 240 hours).   Radiological Exams on Admission: CT Angio Aortobifemoral W and/or Wo Contrast Result Date: 07/17/2024 CLINICAL DATA:  Right leg pain and swelling with absent distal pulses, no known injury, initial encounter EXAM: CT ANGIOGRAPHY OF ABDOMINAL AORTA WITH ILIOFEMORAL RUNOFF TECHNIQUE: Multidetector CT imaging of the abdomen, pelvis and lower extremities was performed using the standard protocol during bolus administration of intravenous contrast. Multiplanar CT image reconstructions and MIPs were obtained to evaluate the vascular anatomy. RADIATION DOSE REDUCTION: This exam was performed according to the departmental dose-optimization program which includes  automated exposure control, adjustment of the mA and/or kV according to patient size and/or use of iterative reconstruction technique. CONTRAST:  OMNIPAQUE  IOHEXOL  350 MG/ML SOLN COMPARISON:  None Available. FINDINGS: VASCULAR Aorta: Atherosclerotic calcifications of the aorta are seen. Celiac: Patent without evidence of aneurysm, dissection, vasculitis or significant stenosis. 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 There is approximately a 15 mm short segment occlusion identified prior to reconstitution of the distal SMA branches via collaterals. Renals: Dual renal arteries are identified bilaterally. IMA: IMA is hypertrophied in part related to distal reconstitution of the SMA branches. RIGHT Lower Extremity Inflow: Common and external iliac artery are within normal limits. Common femoral artery demonstrates mild calcification without focal stenosis. Runoff: Superficial femoral artery demonstrates atherosclerotic calcifications. Some areas of mild stenosis are seen although there is apparent occlusion in the mid popliteal artery. Multiple muscular collaterals are identified in the calf. The infrapopliteal vessels are heavily calcified. Very mild segmental reconstitution of the anterior tibial and posterior tibial arteries is seen. Minimal visualization into the foot is seen. LEFT Lower Extremity Inflow: Atherosclerotic calcifications are noted in the common and external iliac artery. Common femoral artery and its bifurcation are within normal limits. Runoff: Superficial femoral artery demonstrates atherosclerotic calcifications similar to that seen on the right. The degree of opacification is limited in the distal superficial femoral artery and popliteal artery. Multiple muscular collaterals are noted similar to that seen on the right with minimal short-segment reconstitution of primarily the peroneal artery. Minimal reconstitution of the left dorsalis pedis is seen. Veins: No specific  venous abnormality is noted. Review of the MIP images confirms the above findings. NON-VASCULAR Lower chest: No acute abnormality. Hepatobiliary: Hypodensity is noted in the central aspect of the liver which corresponds to previously seen cystic change. Gallbladder is within normal limits. Pancreas: Unremarkable. No pancreatic ductal dilatation or surrounding inflammatory changes. Spleen: Normal in size without focal abnormality. Adrenals/Urinary Tract: Adrenal glands are within normal limits. Kidneys demonstrate patchy decreased enhancement particularly on the left. This may simply be related to the timing of the contrast bolus. Possibility of renal infarct or inflammatory change deserves consideration. No obstructive changes are seen. The bladder is well distended. Stomach/Bowel: Scattered fecal material  is noted throughout the colon. No obstructive or inflammatory changes are noted. The appendix is within normal limits. Small bowel and stomach are unremarkable. Lymphatic: No lymphadenopathy is noted. Reproductive: Prostate is significantly enlarged indenting upon the bladder. Other: No abdominal wall hernia or abnormality. No abdominopelvic ascites. Musculoskeletal: Degenerative changes of lumbar spine are noted. No acute bony abnormality is seen. 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 IMA hypertrophy related to distal reconstitution of the SMA. Poor visualization of the distal superficial femoral and popliteal arteries bilaterally likely related to distal occlusion. Heavy atherosclerotic calcifications are noted in the infrapopliteal vessels bilaterally. Mild reconstitution via muscular collaterals is noted on the right to the anterior and posterior tibial arteries. Reconstitution on the left is to mainly the peroneal artery. NON-VASCULAR Prominent prostate. Patchy decreased enhancement within the left kidney. This is of uncertain etiology but could be related to small focal  infarct or inflammatory change in the kidney. Correlate with laboratory values. Electronically Signed   By: Oneil Devonshire M.D.   On: 07/17/2024 23:12     Assessment/Plan Principal Problem:   Ischemic foot Active Problems:   Essential hypertension   Chronic systolic heart failure (HCC)   Diabetes (HCC)   History of CVA (cerebrovascular accident)    Ischemic right foot appreciate vascular surgery consult patient is placed on heparin  infusion awaiting Eliquis  washout before surgery on Monday, 07/21/2024. History of CVA with left-sided weakness.  Takes statins and Eliquis  with prior history of embolic CVA with LV thrombus.  Patient also has weakness of the right lower extremity.  Will check MRI brain. Diabetes mellitus type 2 last hemoglobin A1c was 6.6 3 months ago takes Jardiance  and metformin .  Presently on sliding scale coverage. History of CAD with ischemic cardiomyopathy last EF measured was 25% on June 30, 2022.  On carvedilol  ARB and statins.  Since patient has acute ischemic foot will need close monitoring further workup and more than 2 midnight stay.   DVT prophylaxis: Heparin  infusion. Code Status: Full code. Family Communication: Discussed with patient. Disposition Plan: Medical floor. Consults called: Vascular surgery. Admission status: Inpatient.

## 2024-07-18 NOTE — Consult Note (Signed)
 [facility] [place]  Patient name: Karl [name] MRN: [id number] DOB: 1956/02/27 Sex: male   REQUESTING PROVIDER:   ER   REASON FOR CONSULT:    Right leg ischemia  HISTORY OF PRESENT ILLNESS:   Karl [name] is a 68 y.o. male, who presented to the emergency department with right foot pain that has been going on for 2 to 3 weeks.  He has been nonambulatory since that time.  He does not have any open wounds but has a large right great toenail with fungal infection.  He has rest pain.  The patient is a diabetic.  He has a history of stroke with slight right sided weakness.  He is medically managed for hypertension.  He is on Eliquis  for atrial fibrillation.  He takes a statin for hypercholesterolemia.  He is a former smoker  PAST MEDICAL HISTORY    Past Medical History:  Diagnosis Date   Acute CVA (cerebrovascular accident) (HCC) 06/14/2018   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 Waddell 15 years 1998   Dyspnea    Hypertension    Stroke [facility]) 2019   denies residual on 06/14/2018   TIA (transient ischemic attack) 12/19/2017   Type II diabetes mellitus (HCC) 07/2014 dx     FAMILY HISTORY   Family History  Problem Relation Age of Onset   Diabetes Mother    Diabetes Father    Stroke Father 68   Diabetes Sister    Clotting disorder Sister 24   Diabetes Other        nephew   Diabetes Brother    Diabetes Maternal Grandmother    Colon cancer Neg Hx    Esophageal cancer Neg Hx    Pancreatic cancer Neg Hx    Stomach cancer Neg Hx    Liver disease Neg Hx     SOCIAL HISTORY:   Social History   Socioeconomic History   Marital status: Married    Spouse name: Not on file   Number of children: Not on file   Years of education: Not on file   Highest education level: Not on file  Occupational History   Not on file  Tobacco Use   Smoking status: Former    Current  packs/day: 0.00    Average packs/day: 2.0 packs/day for 5.0 years (10.0 ttl pk-yrs)    Types: Cigarettes    Start date: 12/05/1995    Quit date: 12/04/2000    Years since quitting: 23.6   Smokeless tobacco: Never  Vaping Use   Vaping status: Never Used  Substance and Sexual Activity   Alcohol use: Not Currently   Drug use: Not Currently   Sexual activity: Not Currently  Other Topics Concern   Not on file  Social History Narrative   Former smoker - quit 1998 after stent -    Lives with dtr and 2 g-sons   Separated from wife in 2005, good terms   Employed with [employer] - walks at lot at work   No regular exercise   Social Drivers of Corporate investment banker Strain: Not on file  Food Insecurity: No Food Insecurity (07/06/2022)   Hunger Vital Sign    Worried About Running Out of Food in the Last Year: Never true    Ran Out of Food in the Last Year: Never true  Transportation Needs: No Transportation Needs (07/06/2022)  PRAPARE - Administrator, Civil Service (Medical): No    Lack of Transportation (Non-Medical): No  Physical Activity: Not on file  Stress: Not on file  Social Connections: Not on file  Intimate Partner Violence: Not on file    ALLERGIES:    Allergies  Allergen Reactions   Lipitor [Atorvastatin] Nausea And Vomiting    CURRENT MEDICATIONS:    No current facility-administered medications for this encounter.   Current Outpatient Medications  Medication Sig Dispense Refill   acetaminophen  (TYLENOL ) 325 MG tablet Take 1-2 tablets (325-650 mg total) by mouth every 4 (four) hours as needed for mild pain.     apixaban  (ELIQUIS ) 5 MG TABS tablet Take 1 tablet (5 mg total) by mouth 2 (two) times daily. Needs Cardiology appt for refills.  Call office [phone].  Thank you..Take 1 tablet (5 mg total) by mouth 2 (two) times daily. 60 tablet 0   carvedilol  (COREG ) 3.125 MG tablet TAKE 1 TABLET BY MOUTH TWICE DAILY WITH A MEAL 180  tablet 0   empagliflozin  (JARDIANCE ) 10 MG TABS tablet Take 1 tablet (10 mg total) by mouth daily before breakfast. 90 tablet 1   Lancets 30G MISC Use to check blood sugars daily 100 each 3   losartan  (COZAAR ) 25 MG tablet TAKE 1 TABLET BY MOUTH ONCE DAILY **FOLLOW  UP  APPOINTMENT  DUE  IN  OCTOBER  MUST  SEE  PROVIDER  FOR  FUTURE  REFILLS** 90 tablet 0   metFORMIN  (GLUCOPHAGE ) 1000 MG tablet Take 1 tablet (1,000 mg total) by mouth 2 (two) times daily with a meal. 180 tablet 0   potassium chloride  (KLOR-CON  M) 10 MEQ tablet Take 1 tablet (10 mEq total) by mouth 2 (two) times daily. 180 tablet 1   rosuvastatin  (CRESTOR ) 20 MG tablet Take 1 tablet (20 mg total) by mouth daily. 90 tablet 1    REVIEW OF SYSTEMS:   [X]  denotes positive finding, [ ]  denotes negative finding Cardiac  Comments:  Chest pain or chest pressure:    Shortness of breath upon exertion:    Short of breath when lying flat:    Irregular heart rhythm:        Vascular    Pain in calf, thigh, or hip brought on by ambulation: x   Pain in feet at night that wakes you up from your sleep:  x   Blood clot in your veins:    Leg swelling:         Pulmonary    Oxygen at home:    Productive cough:     Wheezing:         Neurologic    Sudden weakness in arms or legs:     Sudden numbness in arms or legs:     Sudden onset of difficulty speaking or slurred speech:    Temporary loss of vision in one eye:     Problems with dizziness:         Gastrointestinal    Blood in stool:      Vomited blood:         Genitourinary    Burning when urinating:     Blood in urine:        Psychiatric    Major depression:         Hematologic    Bleeding problems:    Problems with blood clotting too easily:        Skin    Rashes or ulcers:  Constitutional    Fever or chills:     PHYSICAL EXAM:   Vitals:   07/17/24 2130 07/17/24 2145 07/17/24 2200 07/17/24 2215  BP: 123/79 117/74 136/81 116/74  Pulse: 92 94 100 91   Resp:      Temp:      TempSrc:      SpO2: 100% 100% 100% 100%    GENERAL: The patient is a well-nourished male, in no acute distress. The vital signs are documented above. CARDIAC: There is a regular rate and rhythm.  VASCULAR: Palpable femoral pulses.  Brisk left posterior tibial Doppler signal.  Faint monophasic right anterior tibial Doppler signal.  Biphasic right popliteal signal PULMONARY: Nonlabored respirations ABDOMEN: Soft and non-tender MUSCULOSKELETAL: There are no major deformities or cyanosis. NEUROLOGIC: Unable to move his toes.  Difficulty with plantar and dorsiflexion of his ankle.  Decreased sensation on the foot SKIN: There are no ulcers or rashes noted. PSYCHIATRIC: The patient has a normal affect.  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   IMA hypertrophy related to distal reconstitution of the SMA.   Poor visualization of the distal superficial femoral and popliteal arteries bilaterally likely related to distal occlusion. Heavy atherosclerotic calcifications are noted in the infrapopliteal vessels bilaterally. Mild reconstitution via muscular collaterals is noted on the right to the anterior and posterior tibial arteries. Reconstitution on the left is to mainly the peroneal artery.   NON-VASCULAR   Prominent prostate.   Patchy decreased enhancement within the left kidney. This is of uncertain etiology but could be related to small focal infarct or inflammatory change in the kidney. Correlate with laboratory values.   ASSESSMENT and PLAN   Right leg ischemia: CT scan does not fully define his blood flow to the right leg likely secondary to timing or distal occlusion.  His tibial vessels are heavily calcified.  I discussed with the patient and his family that he needs to undergo angiography to better define his anatomy and see if he has options for revascularization.   I discussed that he is at risk for amputation as well given the findings on his physical exam.  I have recommended admission to the hospital for initiation of IV heparin  to let his Eliquis  wear off.  This will also help with his pain control.  We will plan on angiography on Monday.   Malvina Serene CLORE, MD, FACS [facility] [phone] Pager [phone]

## 2024-07-18 NOTE — Progress Notes (Signed)
 Patient returned from MRI, TELE reapplied, HEP gtt restarted. Breakfast ordered.

## 2024-07-18 NOTE — Progress Notes (Signed)
 PHARMACY - ANTICOAGULATION CONSULT NOTE  Pharmacy Consult for heparin  Indication: ischemic limb  Allergies  Allergen Reactions   Lipitor [Atorvastatin] Nausea And Vomiting    Patient Measurements: Height: 6' 2 (188 cm) Weight: 72.1 kg (158 lb 15.2 oz) (Wt from 05/28/2024) IBW/kg (Calculated) : 82.2 HEPARIN  DW (KG): 72.1  Vital Signs: Temp: 98.3 F (36.8 C) (08/14 2106) Temp Source: Oral (08/14 2106) BP: 116/74 (08/14 2215) Pulse Rate: 91 (08/14 2215)  Labs: Recent Labs    07/17/24 2047  HGB 13.0  HCT 42.5  PLT 477*  APTT 46*  LABPROT 20.1*  INR 1.6*  CREATININE 1.19  CKTOTAL 1,583*    Estimated Creatinine Clearance: 59.7 mL/min (by C-G formula based on SCr of 1.19 mg/dL).   Medical History: Past Medical History:  Diagnosis Date   Acute CVA (cerebrovascular accident) (HCC) 06/14/2018   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 Waddell 15 years 1998   Dyspnea    Hypertension    Stroke [facility]) 2019   denies residual on 06/14/2018   TIA (transient ischemic attack) 12/19/2017   Type II diabetes mellitus (HCC) 07/2014 dx    Medications:  -Eliquis  5mg  PO BID (LD 8/14 AM)  Assessment: 8 yoM presented to ED with right foot pain. Pharmacy consulted to dose heparin  for ischemic limb.   -CBC stable  Goal of Therapy:  Heparin  level 0.3-0.7 units/ml aPTT 66-102 seconds Monitor platelets by anticoagulation protocol: Yes   Plan:  No bolus given recent eliquis  use Start heparin  infusion at 1200 units/hr Check apTT and anti-Xa level in 8 hours and daily while on heparin  Monitor with aPTT until correlates with heparin  level Continue to monitor H&H and platelets  Lynwood Poplar, PharmD, BCPS Clinical Pharmacist 07/18/2024 12:42 AM

## 2024-07-18 NOTE — Progress Notes (Signed)
 PHARMACY - ANTICOAGULATION CONSULT NOTE  Pharmacy Consult for heparin  Indication: ischemic limb  Allergies  Allergen Reactions   Lipitor [Atorvastatin] Nausea And Vomiting    Patient Measurements: Height: 6' 2 (188 cm) Weight: 72.1 kg (158 lb 15.2 oz) (Wt from 05/28/2024) IBW/kg (Calculated) : 82.2 HEPARIN  DW (KG): 72.1  Vital Signs: Temp: 98.2 F (36.8 C) (08/15 1423) Temp Source: Rectal (08/15 1423) BP: 118/73 (08/15 1423) Pulse Rate: 92 (08/15 1423)  Labs: Recent Labs    07/17/24 2047 07/18/24 0817 07/18/24 1831  HGB 13.0 13.0  --   HCT 42.5 41.8  --   PLT 477* 495*  --   APTT 46* 43* 92*  LABPROT 20.1*  --   --   INR 1.6*  --   --   HEPARINUNFRC  --  >1.10*  --   CREATININE 1.19 1.22  --   CKTOTAL 1,583*  --   --     Estimated Creatinine Clearance: 58.3 mL/min (by C-G formula based on SCr of 1.22 mg/dL).  Medications:  -Eliquis  5mg  PO BID (LD 8/14 AM)  Assessment: 33 yoM presented to ED with right foot pain. Pharmacy consulted to dose heparin  for ischemic limb.   -aPTT is subtherapeutic at 43 sec on 1200 units/hr. As expected, heparin  level is >1.1 from effects of recent apixaban . No bleeding noted, CBC is stable. Per RN, no problems with bleeding. She reports heparin  drip off surrounding MRI which appears to be done ~7:30 and off for 30 min.  8/15 PM update: aPTT 92 seconds No signs of bleeding or pauses in gtt  Goal of Therapy:  Heparin  level 0.3-0.7 units/ml aPTT 66-102 seconds Monitor platelets by anticoagulation protocol: Yes   Plan:  continue heparin  infusion to 1450 units/hr Daily aPTT, heparin  level, CBC Monitor with aPTT until correlates with heparin  level Monitor for s/sx of bleeding  Thank you for involving pharmacy in this patient's care.   Benedetta Heath BS, PharmD, BCPS Clinical Pharmacist 07/18/2024 7:55 PM  Contact: [phone] after 3 PM

## 2024-07-18 NOTE — Progress Notes (Signed)
 IV consult for second IV for antibiotics. No information about compatibility with heparin  via Pharmacist.

## 2024-07-18 NOTE — Progress Notes (Signed)
 PHARMACY NOTE:  ANTIMICROBIAL RENAL DOSAGE ADJUSTMENT  Current antimicrobial regimen includes a mismatch between antimicrobial dosage and estimated renal function.  As per policy approved by the Pharmacy & Therapeutics and Medical Executive Committees, the antimicrobial dosage will be adjusted accordingly.  Current antimicrobial dosage:  cefepime  2 g IV q8h  Indication: wound infection  Renal Function:  Estimated Creatinine Clearance: 58.3 mL/min (by C-G formula based on SCr of 1.22 mg/dL). []      On intermittent HD, scheduled: []      On CRRT    Antimicrobial dosage has been changed to:  cefepime  2 g IV q12h  Additional comments:   Thank you for involving pharmacy in this patient's care.  Delon Sax, PharmD, BCPS Clinical Pharmacist Clinical phone for 07/18/2024 is [phone] 07/18/2024 12:58 PM

## 2024-07-18 NOTE — TOC Initial Note (Signed)
 Transition of Care [facility]) - Initial/Assessment Note    Patient Details  Name: Karl [name] MRN: [id number] Date of Birth: [birth year]  [facility] CM/SW Contact:    Rosalva Jon Bloch, RN Phone Number: 07/18/2024, 12:24 PM  Clinical Narrative:                    Ischemic R foot  From home alone.States sister lives next door. Supportive family. PTA independent with ADL's. DME @ home :cane, RW, rollator, W/C. Pt without transportation issues or RX med concerns. PCP: Harlene Hope MD  Plan: abd aorta angiogram on Monday.     TOC team following and will assist with needs...   Expected Discharge Plan: Home/Self Care Barriers to Discharge: Continued Medical Work up   Patient Goals and CMS Choice            Expected Discharge Plan and Services   Discharge Planning Services: CM Consult   Living arrangements for the past 2 months: Apartment                                      Prior Living Arrangements/Services Living arrangements for the past 2 months: Apartment Lives with:: Self Patient language and need for interpreter reviewed:: Yes Do you feel safe going back to the place where you live?: Yes      Need for Family Participation in Patient Care: Yes (Comment) Care giver support system in place?: Yes (comment) Current home services: DME (cane, RW, rollator, W/C) Criminal Activity/Legal Involvement Pertinent to Current Situation/Hospitalization: No - Comment as needed  Activities of Daily Living   ADL Screening (condition at time of admission) Independently performs ADLs?: Yes (appropriate for developmental age) Is the patient deaf or have difficulty hearing?: No Does the patient have difficulty seeing, even when wearing glasses/contacts?: No Does the patient have difficulty concentrating, remembering, or making decisions?: No  Permission Sought/Granted   Permission granted to share information with : Yes, Verbal Permission Granted  Share  Information with NAME: Sahand Gosch  [phone]           Emotional Assessment       Orientation: : Oriented to Self, Oriented to Place, Oriented to  Time, Oriented to Situation Alcohol / Substance Use: Not Applicable Psych Involvement: No (comment)  Admission diagnosis:  Ischemic foot [I99.8] Patient Active Problem List   Diagnosis Date Noted   Ischemic foot 07/18/2024   CKD (chronic kidney disease) stage 3, GFR 30-59 ml/min (HCC) 09/30/2023   Biliary obstruction 06/29/2022   History of pancreatitis 06/29/2022   Anemia 09/12/2021   Lump of skin 03/14/2021   COVID-19 virus infection 12/13/2020   Pneumonia due to COVID-19 virus 12/13/2020   Spastic hemiparesis (HCC) 07/06/2020   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 12/24/2019   Shingles 06/24/2019   Left wrist pain 12/13/2018   Chronic anticoagulation    History of CVA (cerebrovascular accident) 06/18/2018   Encounter for therapeutic drug monitoring 02/08/2018   Coronary artery disease 12/19/2017   Hip flexor tendinitis 05/22/2017   Pain of both hip joints 04/25/2017   Dyslipidemia 09/01/2015   BPH without urinary obstruction 09/01/2015   Diabetes (HCC) 07/10/2014   Essential hypertension 04/20/2009   Cardiomyopathy, ischemic 04/20/2009   Left bundle branch block 04/20/2009   Chronic systolic heart failure (HCC) 04/20/2009   PCP:  Hope Glade PARAS, MD Pharmacy:  [facility] [address] [place] - [address] [place] [address] Phone: [phone] Fax: [phone]  [facility] [address], [place] - [address] [place] [address] Phone: [phone] Fax: [phone]     Social Drivers of Health (SDOH) Social History: SDOH Screenings   Food Insecurity: No Food Insecurity (07/18/2024)  Housing: Low Risk  (07/18/2024)  Transportation Needs: No Transportation Needs (07/18/2024)  Utilities: At Risk (07/18/2024)  Depression  (PHQ2-9): Low Risk  (03/31/2024)  Social Connections: Socially Isolated (07/18/2024)  Tobacco Use: Medium Risk (07/18/2024)   SDOH Interventions:     Readmission Risk Interventions    07/03/2022   12:58 PM  Readmission Risk Prevention Plan  Post Dischage Appt Complete  Medication Screening Complete  Transportation Screening Complete

## 2024-07-18 NOTE — Progress Notes (Signed)
 Mobility Specialist Progress Note:    07/18/24 1555  Mobility  Activity Pivoted/transferred from bed to chair  Level of Assistance Moderate assist, patient does 50-74% (+2)  Assistive Device Other (Comment) (HHA)  Distance Ambulated (ft) 5 ft  Activity Response Tolerated well  Mobility Referral Yes  Mobility visit 1 Mobility  Mobility Specialist Start Time (ACUTE ONLY) 1440  Mobility Specialist Stop Time (ACUTE ONLY) 1454  Mobility Specialist Time Calculation (min) (ACUTE ONLY) 14 min   Pt received in bed agreeable to mobility. No physical assistance needed for bed mobility. ModA +2 for STS and ambulation. No c/o throughout. Left in chair w/ call bell and personal belongings in reach. All needs met. Chair alarm on. RN aware.  Thersia Minder Mobility Specialist  Please contact vis Secure Chat or  Rehab Office [phone]

## 2024-07-19 DIAGNOSIS — I998 Other disorder of circulatory system: Secondary | ICD-10-CM | POA: Diagnosis not present

## 2024-07-19 LAB — CBC
HCT: 40.4 % (ref 39.0–52.0)
Hemoglobin: 12.6 g/dL — ABNORMAL LOW (ref 13.0–17.0)
MCH: 24.8 pg — ABNORMAL LOW (ref 26.0–34.0)
MCHC: 31.2 g/dL (ref 30.0–36.0)
MCV: 79.5 fL — ABNORMAL LOW (ref 80.0–100.0)
Platelets: 472 K/uL — ABNORMAL HIGH (ref 150–400)
RBC: 5.08 MIL/uL (ref 4.22–5.81)
RDW: 16.1 % — ABNORMAL HIGH (ref 11.5–15.5)
WBC: 14.6 K/uL — ABNORMAL HIGH (ref 4.0–10.5)
nRBC: 0 % (ref 0.0–0.2)

## 2024-07-19 LAB — COMPREHENSIVE METABOLIC PANEL WITH GFR
ALT: 22 U/L (ref 0–44)
AST: 48 U/L — ABNORMAL HIGH (ref 15–41)
Albumin: 2.4 g/dL — ABNORMAL LOW (ref 3.5–5.0)
Alkaline Phosphatase: 67 U/L (ref 38–126)
Anion gap: 14 (ref 5–15)
BUN: 20 mg/dL (ref 8–23)
CO2: 22 mmol/L (ref 22–32)
Calcium: 9.1 mg/dL (ref 8.9–10.3)
Chloride: 98 mmol/L (ref 98–111)
Creatinine, Ser: 1.21 mg/dL (ref 0.61–1.24)
GFR, Estimated: >60 mL/min (ref >60)
Glucose, Bld: 101 mg/dL — ABNORMAL HIGH (ref 70–99)
Potassium: 4.3 mmol/L (ref 3.5–5.1)
Sodium: 134 mmol/L — ABNORMAL LOW (ref 135–145)
Total Bilirubin: 1 mg/dL (ref 0.0–1.2)
Total Protein: 7.7 g/dL (ref 6.5–8.1)

## 2024-07-19 LAB — GLUCOSE, CAPILLARY
Glucose-Capillary: 121 mg/dL — ABNORMAL HIGH (ref 70–99)
Glucose-Capillary: 134 mg/dL — ABNORMAL HIGH (ref 70–99)
Glucose-Capillary: 118 mg/dL — ABNORMAL HIGH (ref 70–99)
Glucose-Capillary: 201 mg/dL — ABNORMAL HIGH (ref 70–99)
Glucose-Capillary: 262 mg/dL — ABNORMAL HIGH (ref 70–99)

## 2024-07-19 LAB — PHOSPHORUS: Phosphorus: 3.6 mg/dL (ref 2.5–4.6)

## 2024-07-19 LAB — MAGNESIUM: Magnesium: 2.2 mg/dL (ref 1.7–2.4)

## 2024-07-19 LAB — APTT: aPTT: 96 s — ABNORMAL HIGH (ref 24–36)

## 2024-07-19 LAB — CK: Total CK: 1218 U/L — ABNORMAL HIGH (ref 49–397)

## 2024-07-19 LAB — HEPARIN LEVEL (UNFRACTIONATED): Heparin Unfractionated: 1.01 [IU]/mL — ABNORMAL HIGH (ref 0.30–0.70)

## 2024-07-19 LAB — C-REACTIVE PROTEIN: CRP: 22.4 mg/dL — ABNORMAL HIGH (ref <1.0)

## 2024-07-19 NOTE — Progress Notes (Signed)
 PHARMACY - ANTICOAGULATION CONSULT NOTE  Pharmacy Consult for heparin  Indication: ischemic limb  Allergies  Allergen Reactions   Lipitor [Atorvastatin] Nausea And Vomiting    Patient Measurements: Height: 6' 2 (188 cm) Weight: 72.1 kg (158 lb 15.2 oz) (Wt from 05/28/2024) IBW/kg (Calculated) : 82.2 HEPARIN  DW (KG): 72.1  Vital Signs: Temp: 98 F (36.7 C) (08/16 0832) Temp Source: Rectal (08/16 0832) BP: 108/77 (08/16 0832) Pulse Rate: 96 (08/16 0832)  Labs: Recent Labs    07/17/24 2047 07/18/24 0817 07/18/24 1831 07/19/24 0703  HGB 13.0 13.0  --  12.6*  HCT 42.5 41.8  --  40.4  PLT 477* 495*  --  472*  APTT 46* 43* 92* 96*  LABPROT 20.1*  --   --   --   INR 1.6*  --   --   --   HEPARINUNFRC  --  >1.10*  --  1.01*  CREATININE 1.19 1.22  --  1.21  CKTOTAL 1,583*  --   --  1,218*    Estimated Creatinine Clearance: 58.8 mL/min (by C-G formula based on SCr of 1.21 mg/dL).  Medications:  -Eliquis  5mg  PO BID (LD 8/14 AM)  Assessment: 33 yoM presented to ED with right foot pain. Pharmacy consulted to dose heparin  for ischemic limb.   8/15 AM update:  -aPTT is subtherapeutic at 43 sec on 1200 units/hr. As expected, heparin  level is >1.1 from effects of recent apixaban . No bleeding noted, CBC is stable. Per RN, no problems with bleeding. She reports heparin  drip off surrounding MRI which appears to be done ~7:30 and off for 30 min.  8/15 PM update: aPTT 92 seconds No signs of bleeding or pauses in gtt  8/16 AM update:  aPTT 96 seconds  RN reports no issues with bleeding, lines, or pauses in gtt  Goal of Therapy:  Heparin  level 0.3-0.7 units/ml aPTT 66-102 seconds Monitor platelets by anticoagulation protocol: Yes   Plan:  continue heparin  infusion to 1450 units/hr Daily aPTT, heparin  level, CBC Monitor with aPTT until correlates with heparin  level Monitor for s/sx of bleeding  Thank you for involving pharmacy in this patient's care.  Feliciano Close,  PharmD PGY2 Infectious Diseases Pharmacy Resident  07/19/2024 9:17 AM

## 2024-07-19 NOTE — Plan of Care (Signed)

## 2024-07-19 NOTE — Progress Notes (Signed)
  Progress Note   Patient: Karl [name] FMW:[id number] DOB: [birth year] DOA: 07/17/2024     1 DOS: the patient was seen and examined on 07/19/2024    Assessment and Plan: Ischemic R foot  - IV heparin  drip  - IV cefepime  2 g q12   Hx of CVA w/ L sided weakness - Crestor  20 mg PO daily  - IV heparin  drip as above    DM2  - Novolog  SS tid  - Heart healthy and carb diet   Hx of CAD w/ ischemic cardiomyopathy  - Coreg  3.125 mg PO bid  - Losartan  25 mg PO daily   Subjective: Pt seen and examined at the bedside. WBC downtrending with addition of IV antibx 17.1 -->14.6. CRP elevated at 22.4. CK also down trending.  Pt is planned for angiogram on Monday Jul 21 2024.  Physical Exam: Vitals:   07/18/24 1954 07/19/24 0451 07/19/24 0832 07/19/24 0833  BP: 108/67 120/79 108/77   Pulse: 89 90 96   Resp: 17 18  16   Temp: 98.5 F (36.9 C) 98.2 F (36.8 C) 98 F (36.7 C)   TempSrc:   Rectal   SpO2: 100% 100% 100%   Weight:      Height:       Physical Exam HENT:     Head: Normocephalic.     Mouth/Throat:     Mouth: Mucous membranes are moist.  Cardiovascular:     Rate and Rhythm: Normal rate.  Pulmonary:     Effort: Pulmonary effort is normal.  Abdominal:     Palpations: Abdomen is soft.  Musculoskeletal:     Comments: Cool R foot  Neurological:     Mental Status: He is alert. Mental status is at baseline.  Psychiatric:        Mood and Affect: Mood normal.      Disposition: Status is: Inpatient Remains inpatient appropriate because: IV heparin  drip and angiogram on Monday  Planned Discharge Destination: Barriers to discharge: As above     Time spent: 35 minutes  Author: Ammarie Matsuura , MD 07/19/2024 1:48 PM  For on call review [email or web address].

## 2024-07-20 ENCOUNTER — Inpatient Hospital Stay (HOSPITAL_COMMUNITY)

## 2024-07-20 DIAGNOSIS — I998 Other disorder of circulatory system: Secondary | ICD-10-CM | POA: Diagnosis not present

## 2024-07-20 DIAGNOSIS — I7090 Unspecified atherosclerosis: Secondary | ICD-10-CM | POA: Diagnosis not present

## 2024-07-20 LAB — CBC
HCT: 37.9 % — ABNORMAL LOW (ref 39.0–52.0)
Hemoglobin: 11.9 g/dL — ABNORMAL LOW (ref 13.0–17.0)
MCH: 24.6 pg — ABNORMAL LOW (ref 26.0–34.0)
MCHC: 31.4 g/dL (ref 30.0–36.0)
MCV: 78.5 fL — ABNORMAL LOW (ref 80.0–100.0)
Platelets: 421 K/uL — ABNORMAL HIGH (ref 150–400)
RBC: 4.83 MIL/uL (ref 4.22–5.81)
RDW: 16 % — ABNORMAL HIGH (ref 11.5–15.5)
WBC: 12.2 K/uL — ABNORMAL HIGH (ref 4.0–10.5)
nRBC: 0 % (ref 0.0–0.2)

## 2024-07-20 LAB — COMPREHENSIVE METABOLIC PANEL WITH GFR
ALT: 21 U/L (ref 0–44)
AST: 49 U/L — ABNORMAL HIGH (ref 15–41)
Albumin: 2.2 g/dL — ABNORMAL LOW (ref 3.5–5.0)
Alkaline Phosphatase: 64 U/L (ref 38–126)
Anion gap: 12 (ref 5–15)
BUN: 16 mg/dL (ref 8–23)
CO2: 21 mmol/L — ABNORMAL LOW (ref 22–32)
Calcium: 9.1 mg/dL (ref 8.9–10.3)
Chloride: 102 mmol/L (ref 98–111)
Creatinine, Ser: 1.07 mg/dL (ref 0.61–1.24)
GFR, Estimated: >60 mL/min (ref >60)
Glucose, Bld: 115 mg/dL — ABNORMAL HIGH (ref 70–99)
Potassium: 4 mmol/L (ref 3.5–5.1)
Sodium: 135 mmol/L (ref 135–145)
Total Bilirubin: 0.8 mg/dL (ref 0.0–1.2)
Total Protein: 7.3 g/dL (ref 6.5–8.1)

## 2024-07-20 LAB — C-REACTIVE PROTEIN: CRP: 18.8 mg/dL — ABNORMAL HIGH (ref <1.0)

## 2024-07-20 LAB — GLUCOSE, CAPILLARY
Glucose-Capillary: 113 mg/dL — ABNORMAL HIGH (ref 70–99)
Glucose-Capillary: 237 mg/dL — ABNORMAL HIGH (ref 70–99)
Glucose-Capillary: 103 mg/dL — ABNORMAL HIGH (ref 70–99)
Glucose-Capillary: 164 mg/dL — ABNORMAL HIGH (ref 70–99)

## 2024-07-20 LAB — PHOSPHORUS: Phosphorus: 2.4 mg/dL — ABNORMAL LOW (ref 2.5–4.6)

## 2024-07-20 LAB — HEPARIN LEVEL (UNFRACTIONATED): Heparin Unfractionated: 0.84 [IU]/mL — ABNORMAL HIGH (ref 0.30–0.70)

## 2024-07-20 LAB — APTT: aPTT: 81 s — ABNORMAL HIGH (ref 24–36)

## 2024-07-20 LAB — MAGNESIUM: Magnesium: 2.1 mg/dL (ref 1.7–2.4)

## 2024-07-20 MED ORDER — SODIUM CHLORIDE 0.9 % IV SOLN
2.0000 g | INTRAVENOUS | Status: DC
  Administered 2024-07-20 – 2024-07-21 (×2): 2 g via INTRAVENOUS
  Filled 2024-07-20 (×3): qty 20

## 2024-07-20 MED ORDER — POTASSIUM PHOSPHATES 15 MMOLE/5ML IV SOLN
15.0000 mmol | Freq: Once | INTRAVENOUS | Status: AC
  Administered 2024-07-20: 15 mmol via INTRAVENOUS
  Filled 2024-07-20: qty 5

## 2024-07-20 NOTE — Progress Notes (Signed)
 VASCULAR LAB    ABI has been performed.  See CV proc for preliminary results.   Cathleen Yagi, RVT 07/20/2024, 1:35 PM

## 2024-07-20 NOTE — Progress Notes (Signed)
 PHARMACY - ANTICOAGULATION CONSULT NOTE  Pharmacy Consult for heparin  Indication: ischemic limb  Allergies  Allergen Reactions   Lipitor [Atorvastatin] Nausea And Vomiting    Patient Measurements: Height: 6' 2 (188 cm) Weight: 72.1 kg (158 lb 15.2 oz) (Wt from 05/28/2024) IBW/kg (Calculated) : 82.2 HEPARIN  DW (KG): 72.1  Vital Signs: Temp: 98.6 F (37 C) (08/17 0804) Temp Source: Oral (08/17 0804) BP: 120/73 (08/17 0804) Pulse Rate: 79 (08/17 0804)  Labs: Recent Labs    07/17/24 2047 07/18/24 0817 07/18/24 1831 07/19/24 0703 07/20/24 0735  HGB 13.0 13.0  --  12.6* 11.9*  HCT 42.5 41.8  --  40.4 37.9*  PLT 477* 495*  --  472* 421*  APTT 46* 43* 92* 96* 81*  LABPROT 20.1*  --   --   --   --   INR 1.6*  --   --   --   --   HEPARINUNFRC  --  >1.10*  --  1.01* 0.84*  CREATININE 1.19 1.22  --  1.21 1.07  CKTOTAL 1,583*  --   --  1,218*  --     Estimated Creatinine Clearance: 66.4 mL/min (by C-G formula based on SCr of 1.07 mg/dL).  Medications:  -Eliquis  5mg  PO BID (LD 8/14 AM)  Assessment: 73 yoM presented to ED with right foot pain. Pharmacy consulted to dose heparin  for ischemic limb.   8/15 AM update:  -aPTT is subtherapeutic at 43 sec on 1200 units/hr. As expected, heparin  level is >1.1 from effects of recent apixaban . No bleeding noted, CBC is stable. Per RN, no problems with bleeding. She reports heparin  drip off surrounding MRI which appears to be done ~7:30 and off for 30 min.  8/15 PM update: aPTT 92 seconds No signs of bleeding or pauses in infusion  8/16 AM update:  aPTT 96 seconds  RN reports no issues with bleeding, lines, or pauses in infusion  8/17 AM update:  aPTT 81 No documented signs of bleeding or pauses of infusion  Goal of Therapy:  Heparin  level 0.3-0.7 units/ml aPTT 66-102 seconds Monitor platelets by anticoagulation protocol: Yes   Plan:  continue heparin  infusion to 1450 units/hr Daily aPTT, heparin  level, CBC Monitor with  aPTT until correlates with heparin  level Monitor for s/sx of bleeding  Thank you for involving pharmacy in this patient's care.  Feliciano Close, PharmD PGY2 Infectious Diseases Pharmacy Resident  07/20/2024 9:21 AM

## 2024-07-20 NOTE — Progress Notes (Signed)
  Progress Note   Patient: Karl [name] FMW:[id number] DOB: [birth year] DOA: 07/17/2024     2 DOS: the patient was seen and examined on 07/20/2024    Assessment and Plan: Ischemic R foot  - IV heparin  drip  - IV ceftriaxone  2 g daily  - NPO after midnight for angiogram with vascular surgery 07/21/2024   Hx of CVA w/ L sided weakness - Crestor  20 mg PO daily  - IV heparin  drip as above     DM2  - Novolog  SS tid  - Heart healthy and carb diet    Hx of CAD w/ ischemic cardiomyopathy  - Coreg  3.125 mg PO bid  - Losartan  25 mg PO daily   Subjective: Pt seen and examined at the bedside. IV cefepime  changed to ceftriaxone  after discussion with pharmacy (pt low risk for pseudomonas). WBC downtrending 14.6 -->12.2. CRP improving 22 -->18.   NPO after midnight for angiogram with vascular surgery on 07/21/2024 (angiography from left common femoral approach tomorrow).  Physical Exam: Vitals:   07/19/24 1428 07/19/24 1953 07/20/24 0446 07/20/24 0804  BP: 106/60 98/61 117/77 120/73  Pulse: 93 89 91 79  Resp: 16 17 16 16   Temp: 97.9 F (36.6 C) 98.6 F (37 C) 98.4 F (36.9 C) 98.6 F (37 C)  TempSrc: Rectal Oral Oral Oral  SpO2: 100% 100% 100% 100%  Weight:      Height:       HENT:     Head: Normocephalic.     Mouth/Throat:     Mouth: Mucous membranes are moist.  Cardiovascular:     Rate and Rhythm: Normal rate.  Pulmonary:     Effort: Pulmonary effort is normal.  Abdominal:     Palpations: Abdomen is soft.  Musculoskeletal:     Comments: Cool R foot  Neurological:     Mental Status: He is alert. Mental status is at baseline.  Psychiatric:        Mood and Affect: Mood normal.      Disposition: Status is: Inpatient Remains inpatient appropriate because: Anigogram, IV antibx, IV heparin  drip   Planned Discharge Destination: Barriers to discharge: As above     Time spent: 35 minutes  Author: Alyssabeth Bruster , MD 07/20/2024 12:23 PM  For on call review  [email or web address].

## 2024-07-20 NOTE — Progress Notes (Signed)
  Progress Note    07/20/2024 11:20 AM * No surgery found *  Subjective: Still having right foot pain  Vitals:   07/20/24 0446 07/20/24 0804  BP: 117/77 120/73  Pulse: 91 79  Resp: 16 16  Temp: 98.4 F (36.9 C) 98.6 F (37 C)  SpO2: 100% 100%    Physical Exam: Awake alert and oriented Right lower extremity is weak foot is cool to touch Femoral pulses are palpable bilaterally  CBC    Component Value Date/Time   WBC 12.2 (H) 07/20/2024 0735   RBC 4.83 07/20/2024 0735   HGB 11.9 (L) 07/20/2024 0735   HCT 37.9 (L) 07/20/2024 0735   PLT 421 (H) 07/20/2024 0735   MCV 78.5 (L) 07/20/2024 0735   MCH 24.6 (L) 07/20/2024 0735   MCHC 31.4 07/20/2024 0735   RDW 16.0 (H) 07/20/2024 0735   LYMPHSABS 1.3 07/18/2024 0817   MONOABS 1.7 (H) 07/18/2024 0817   EOSABS 0.0 07/18/2024 0817   BASOSABS 0.0 07/18/2024 0817    BMET    Component Value Date/Time   NA 135 07/20/2024 0735   NA 135 12/25/2017 1143   K 4.0 07/20/2024 0735   CL 102 07/20/2024 0735   CO2 21 (L) 07/20/2024 0735   GLUCOSE 115 (H) 07/20/2024 0735   BUN 16 07/20/2024 0735   BUN 15 12/25/2017 1143   CREATININE 1.07 07/20/2024 0735   CREATININE 1.69 (H) 07/06/2020 1009   CALCIUM  9.1 07/20/2024 0735   GFRNONAA >60 07/20/2024 0735   GFRNONAA 42 (L) 07/06/2020 1009   GFRAA 48 (L) 07/06/2020 1009    INR    Component Value Date/Time   INR 1.6 (H) 07/17/2024 2047   INR 8.0 05/22/2024 1112     Intake/Output Summary (Last 24 hours) at 07/20/2024 1120 Last data filed at 07/20/2024 1102 Gross per 24 hour  Intake 357.29 ml  Output 1150 ml  Net -792.71 ml     Assessment/plan:  68 y.o. male is here with right lower extremity pain secondary to ischemia consistent with ischemic rest pain.  I have ordered ABIs for baseline.  Plan will be for angiography from left common femoral approach tomorrow.  He remains high risk for amputation.    Chrisma Hurlock C. Sheree, MD [facility] Office:  [phone] Pager: [phone]  07/20/2024 11:20 AM

## 2024-07-20 NOTE — Plan of Care (Signed)
  Problem: Education: Goal: Knowledge of General Education information will improve Description: Including pain rating scale, medication(s)/side effects and non-pharmacologic comfort measures Outcome: Progressing   Problem: Health Behavior/Discharge Planning: Goal: Ability to manage health-related needs will improve Outcome: Progressing   Problem: Clinical Measurements: Goal: Diagnostic test results will improve Outcome: Progressing Goal: Respiratory complications will improve Outcome: Progressing Goal: Cardiovascular complication will be avoided Outcome: Progressing   Problem: Nutrition: Goal: Adequate nutrition will be maintained Outcome: Progressing

## 2024-07-21 ENCOUNTER — Ambulatory Visit (HOSPITAL_COMMUNITY): Admission: RE | Admit: 2024-07-21 | Source: Home / Self Care | Admitting: Vascular Surgery

## 2024-07-21 ENCOUNTER — Inpatient Hospital Stay (HOSPITAL_COMMUNITY)
Admission: EM | Disposition: A | Discharge status: Skilled Nursing Facility | Payer: Self-pay | Source: Home / Self Care | Attending: Student

## 2024-07-21 DIAGNOSIS — I4891 Unspecified atrial fibrillation: Secondary | ICD-10-CM

## 2024-07-21 DIAGNOSIS — I70202 Unspecified atherosclerosis of native arteries of extremities, left leg: Secondary | ICD-10-CM

## 2024-07-21 DIAGNOSIS — Z8673 Personal history of transient ischemic attack (TIA), and cerebral infarction without residual deficits: Secondary | ICD-10-CM | POA: Diagnosis not present

## 2024-07-21 DIAGNOSIS — I5022 Chronic systolic (congestive) heart failure: Secondary | ICD-10-CM

## 2024-07-21 DIAGNOSIS — I10 Essential (primary) hypertension: Secondary | ICD-10-CM

## 2024-07-21 DIAGNOSIS — E1159 Type 2 diabetes mellitus with other circulatory complications: Secondary | ICD-10-CM | POA: Diagnosis not present

## 2024-07-21 DIAGNOSIS — I998 Other disorder of circulatory system: Secondary | ICD-10-CM | POA: Diagnosis not present

## 2024-07-21 DIAGNOSIS — Z7901 Long term (current) use of anticoagulants: Secondary | ICD-10-CM

## 2024-07-21 DIAGNOSIS — I70221 Atherosclerosis of native arteries of extremities with rest pain, right leg: Secondary | ICD-10-CM

## 2024-07-21 DIAGNOSIS — R531 Weakness: Secondary | ICD-10-CM

## 2024-07-21 HISTORY — PX: ABDOMINAL AORTOGRAM: CATH118222

## 2024-07-21 HISTORY — PX: LOWER EXTREMITY ANGIOGRAPHY: CATH118251

## 2024-07-21 LAB — COMPREHENSIVE METABOLIC PANEL WITH GFR
ALT: 23 U/L (ref 0–44)
AST: 64 U/L — ABNORMAL HIGH (ref 15–41)
Albumin: 2.1 g/dL — ABNORMAL LOW (ref 3.5–5.0)
Alkaline Phosphatase: 63 U/L (ref 38–126)
Anion gap: 10 (ref 5–15)
BUN: 14 mg/dL (ref 8–23)
CO2: 26 mmol/L (ref 22–32)
Calcium: 9.3 mg/dL (ref 8.9–10.3)
Chloride: 99 mmol/L (ref 98–111)
Creatinine, Ser: 1.14 mg/dL (ref 0.61–1.24)
GFR, Estimated: >60 mL/min (ref >60)
Glucose, Bld: 118 mg/dL — ABNORMAL HIGH (ref 70–99)
Potassium: 4.5 mmol/L (ref 3.5–5.1)
Sodium: 135 mmol/L (ref 135–145)
Total Bilirubin: 0.6 mg/dL (ref 0.0–1.2)
Total Protein: 7.4 g/dL (ref 6.5–8.1)

## 2024-07-21 LAB — GLUCOSE, CAPILLARY
Glucose-Capillary: 114 mg/dL — ABNORMAL HIGH (ref 70–99)
Glucose-Capillary: 107 mg/dL — ABNORMAL HIGH (ref 70–99)
Glucose-Capillary: 97 mg/dL (ref 70–99)
Glucose-Capillary: 174 mg/dL — ABNORMAL HIGH (ref 70–99)

## 2024-07-21 LAB — CBC
HCT: 38.8 % — ABNORMAL LOW (ref 39.0–52.0)
Hemoglobin: 12.2 g/dL — ABNORMAL LOW (ref 13.0–17.0)
MCH: 25.1 pg — ABNORMAL LOW (ref 26.0–34.0)
MCHC: 31.4 g/dL (ref 30.0–36.0)
MCV: 79.7 fL — ABNORMAL LOW (ref 80.0–100.0)
Platelets: 403 K/uL — ABNORMAL HIGH (ref 150–400)
RBC: 4.87 MIL/uL (ref 4.22–5.81)
RDW: 16 % — ABNORMAL HIGH (ref 11.5–15.5)
WBC: 11.7 K/uL — ABNORMAL HIGH (ref 4.0–10.5)
nRBC: 0 % (ref 0.0–0.2)

## 2024-07-21 LAB — APTT
aPTT: 131 s — ABNORMAL HIGH (ref 24–36)
aPTT: 73 s — ABNORMAL HIGH (ref 24–36)

## 2024-07-21 LAB — C-REACTIVE PROTEIN: CRP: 16 mg/dL — ABNORMAL HIGH (ref <1.0)

## 2024-07-21 LAB — PROTIME-INR
INR: 1.3 — ABNORMAL HIGH (ref 0.8–1.2)
Prothrombin Time: 16.6 s — ABNORMAL HIGH (ref 11.4–15.2)

## 2024-07-21 LAB — VAS US ABI WITH/WO TBI
Left ABI: 1.57
Right ABI: ABSENT

## 2024-07-21 LAB — MAGNESIUM: Magnesium: 2 mg/dL (ref 1.7–2.4)

## 2024-07-21 LAB — HEPARIN LEVEL (UNFRACTIONATED): Heparin Unfractionated: 0.91 [IU]/mL — ABNORMAL HIGH (ref 0.30–0.70)

## 2024-07-21 LAB — PHOSPHORUS: Phosphorus: 2.6 mg/dL (ref 2.5–4.6)

## 2024-07-21 MED ORDER — LABETALOL HCL 5 MG/ML IV SOLN: 10.0000 mg | INTRAVENOUS | Status: DC | PRN

## 2024-07-21 MED ORDER — IODIXANOL 320 MG/ML IV SOLN
INTRAVENOUS | Status: DC | PRN
  Administered 2024-07-21: 80 mL

## 2024-07-21 MED ORDER — FENTANYL CITRATE (PF) 100 MCG/2ML IJ SOLN
INTRAMUSCULAR | Status: DC | PRN
  Administered 2024-07-21: 50 ug via INTRAVENOUS

## 2024-07-21 MED ORDER — SODIUM CHLORIDE 0.9 % WEIGHT BASED INFUSION: 1.0000 mL/kg/h | INTRAVENOUS | Status: AC

## 2024-07-21 MED ORDER — SODIUM CHLORIDE 0.9 % IV SOLN: 250.0000 mL | INTRAVENOUS | Status: AC | PRN

## 2024-07-21 MED ORDER — HEPARIN SODIUM (PORCINE) 1000 UNIT/ML IJ SOLN
INTRAMUSCULAR | Status: AC
  Filled 2024-07-21: qty 10

## 2024-07-21 MED ORDER — LIDOCAINE HCL (PF) 1 % IJ SOLN
INTRAMUSCULAR | Status: AC
  Filled 2024-07-21: qty 30

## 2024-07-21 MED ORDER — MIDAZOLAM HCL 2 MG/2ML IJ SOLN
INTRAMUSCULAR | Status: AC
  Filled 2024-07-21: qty 2

## 2024-07-21 MED ORDER — SODIUM CHLORIDE 0.9% FLUSH
3.0000 mL | Freq: Two times a day (BID) | INTRAVENOUS | Status: DC
  Administered 2024-07-22 – 2024-07-31 (×18): 3 mL via INTRAVENOUS

## 2024-07-21 MED ORDER — HEPARIN SODIUM (PORCINE) 1000 UNIT/ML IJ SOLN
INTRAMUSCULAR | Status: DC | PRN
  Administered 2024-07-21: 5000 [IU] via INTRAVENOUS

## 2024-07-21 MED ORDER — FENTANYL CITRATE (PF) 100 MCG/2ML IJ SOLN
INTRAMUSCULAR | Status: AC
  Filled 2024-07-21: qty 2

## 2024-07-21 MED ORDER — ACETAMINOPHEN 325 MG PO TABS: 650.0000 mg | ORAL_TABLET | ORAL | Status: DC | PRN

## 2024-07-21 MED ORDER — HEPARIN (PORCINE) 25000 UT/250ML-% IV SOLN
1200.0000 [IU]/h | INTRAVENOUS | Status: DC
  Administered 2024-07-21 – 2024-07-23 (×4): 1200 [IU]/h via INTRAVENOUS
  Filled 2024-07-21 (×2): qty 250

## 2024-07-21 MED ORDER — ONDANSETRON HCL 4 MG/2ML IJ SOLN
4.0000 mg | Freq: Four times a day (QID) | INTRAMUSCULAR | Status: DC | PRN
  Administered 2024-07-24: 4 mg via INTRAVENOUS
  Filled 2024-07-21: qty 2

## 2024-07-21 MED ORDER — HEPARIN (PORCINE) IN NACL 1000-0.9 UT/500ML-% IV SOLN
INTRAVENOUS | Status: DC | PRN
  Administered 2024-07-21 (×2): 500 mL

## 2024-07-21 MED ORDER — SODIUM CHLORIDE 0.9% FLUSH: 3.0000 mL | INTRAVENOUS | Status: DC | PRN

## 2024-07-21 MED ORDER — HYDRALAZINE HCL 20 MG/ML IJ SOLN: 5.0000 mg | INTRAMUSCULAR | Status: DC | PRN

## 2024-07-21 MED ORDER — ASPIRIN 81 MG PO TBEC
81.0000 mg | DELAYED_RELEASE_TABLET | Freq: Every day | ORAL | Status: DC
  Administered 2024-07-22 – 2024-07-31 (×10): 81 mg via ORAL
  Filled 2024-07-21 (×10): qty 1

## 2024-07-21 MED ORDER — MIDAZOLAM HCL 2 MG/2ML IJ SOLN
INTRAMUSCULAR | Status: DC | PRN
  Administered 2024-07-21: 1 mg via INTRAVENOUS

## 2024-07-21 NOTE — Plan of Care (Signed)
   Problem: Health Behavior/Discharge Planning: Goal: Ability to manage health-related needs will improve Outcome: Progressing   Problem: Clinical Measurements: Goal: Ability to maintain clinical measurements within normal limits will improve Outcome: Progressing Goal: Will remain free from infection Outcome: Progressing   Problem: Activity: Goal: Risk for activity intolerance will decrease Outcome: Progressing

## 2024-07-21 NOTE — Progress Notes (Signed)
 PHARMACY - ANTICOAGULATION CONSULT NOTE  Pharmacy Consult for heparin  Indication: Ischemic limb and history of Afib  Allergies  Allergen Reactions   Lipitor [Atorvastatin] Nausea And Vomiting    Patient Measurements: Height: 6' 2 (188 cm) Weight: 72.1 kg (158 lb 15.2 oz) (Wt from 05/28/2024) IBW/kg (Calculated) : 82.2 HEPARIN  DW (KG): 72.1  Vital Signs: Temp: 98.7 F (37.1 C) (08/18 0457) Temp Source: Oral (08/17 2044) BP: 134/107 (08/18 0457) Pulse Rate: 96 (08/18 0457)  Labs: Recent Labs    07/19/24 0703 07/20/24 0735 07/21/24 0536  HGB 12.6* 11.9* 12.2*  HCT 40.4 37.9* 38.8*  PLT 472* 421* 403*  APTT 96* 81* 131*  LABPROT  --   --  16.6*  INR  --   --  1.3*  HEPARINUNFRC 1.01* 0.84* 0.91*  CREATININE 1.21 1.07 1.14  CKTOTAL 1,218*  --   --     Estimated Creatinine Clearance: 62.4 mL/min (by C-G formula based on SCr of 1.14 mg/dL).   Assessment: 20 yoM presented to ED with right foot pain. Pharmacy consulted to dose heparin  for ischemic limb and history of Afib. PTA Eliquis  on hold, last dose on 8/14 AM.  Heparin  level and aPTT are both supra-therapeutic.  Confirmed with RN that lab was drawn appropriately; no bleeding reported.  CBC stable  Goal of Therapy:  Heparin  level 0.3-0.7 units/ml aPTT 66-102 seconds Monitor platelets by anticoagulation protocol: Yes   Plan:  Hold IV heparin  x 1 hr (done ~0730), then Resume IV heparin  at 1200 units/hr Check 8 hr aPTT Daily aPTT, heparin  level, CBC Monitor with aPTT until correlates with heparin  level Angiogram today per Vascular  Harshan Kearley D. Lendell, PharmD, BCPS, BCCCP 07/21/2024, 7:33 AM

## 2024-07-21 NOTE — Progress Notes (Signed)
 PROGRESS NOTE  Karl [name] FMW:[id number] DOB: 02-10-1956   PCP: Geofm Glade PARAS, MD  Patient is from: Home.  Uses rolling walker at baseline but recently started using wheelchair  DOA: 07/17/2024 LOS: 3  Chief complaints Chief Complaint  Patient presents with   R Lower leg pain/swelling     Brief Narrative / Interim history: 68 year old M with PMH of CVA with left-sided weakness, DM-2, systolic CHF/ICM, CAD and LBBB and presented to ED with right foot pain and difficulty ambulating for about 2 weeks, and admitted with ischemic right foot. Usually ambulates with the help of walker and last 2 weeks he has been using the wheelchair.  CT angiogram of lower extremity with poor visualization of the arteries.  Vascular surgery consulted.  Patient was started on IV heparin  and ceftriaxone .  And for aortogram and lower extremity angiogram.    Subjective: Seen and examined earlier this morning before he went down for his angiography.  Reports pain in his right foot.  No other complaints.  Denies chest pain or shortness of breath  Objective: Vitals:   07/21/24 1145 07/21/24 1200 07/21/24 1215 07/21/24 1230  BP: (!) 144/86 132/83 134/83 130/83  Pulse:   80   Resp:    20  Temp:      TempSrc:      SpO2:   100%   Weight:      Height:        Examination:  GENERAL: No apparent distress.  Nontoxic. HEENT: MMM.  Vision and hearing grossly intact.  NECK: Supple.  No apparent JVD.  RESP:  No IWOB.  Fair aeration bilaterally. CVS:  RRR. Heart sounds normal.  ABD/GI/GU: BS+. Abd soft, NTND.  MSK/EXT: Significant muscle mass and subcu fat loss.  Diminished DP pulse in right foot. SKIN: no apparent skin lesion or wound NEURO: AA.  Oriented appropriately.  No apparent focal neuro deficit. PSYCH: Calm. Normal affect.   Consultants:  Vascular surgery  Procedures: None  Microbiology summarized: MRSA PCR screen nonreactive.  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  CT angio with poor visualization of arteries.  .  CRP elevated to 16.  Leukocytosis improved. -Continue IV ceftriaxone  and IV heparin  -Plan for aortogram and RLE angiogram today. -Pain control - Continue home Crestor .  Chronic systolic CHF/ICM: TTE in 06/2022 with LVEF of 20 to 25%, GH, G1-DD.  Appears euvolemic on exam.  No respiratory distress.  Not on diuretics at home. -Continue home Coreg , Jardiance , losartan  and Crestor  -I would be cautious with IV fluid. - Closely monitor respiratory and fluid status.  History of CAD/chronic LBBB: No cardiopulmonary symptoms. - Medication as above.  Hx of CVA w/ L sided weakness - Crestor  20 mg PO daily  - IV heparin  drip as above    NIDDM-2 with hyperglycemia: A1c 5.9%. Recent Labs  Lab 07/20/24 0636 07/20/24 1158 07/20/24 1634 07/20/24 2015 07/21/24 0624  GLUCAP 113* 237* 103* 164* 114*  - Continue SSI-sensitive.  Hyperlipidemia - Continue home Crestor   Increased nutrient needs Body mass index is 20.41 kg/m. - Consult dietitian          DVT prophylaxis:  On full dose anticoagulation  Code Status: Full code Family Communication: None at bedside Level of care: Telemetry Medical Status is: Inpatient Remains inpatient appropriate because: Chronic limb threatening ischemia   Final disposition: To be determined   55 minutes with more than 50%  spent in reviewing records, counseling patient/family and coordinating care.   Sch Meds:  Scheduled Meds:  aspirin  EC  81 mg Oral Daily   [MAR Hold] carvedilol   3.125 mg Oral BID WC   [MAR Hold] insulin  aspart  0-9 Units Subcutaneous TID WC   [MAR Hold] losartan   25 mg Oral Daily   [MAR Hold] rosuvastatin   20 mg Oral Daily   Continuous Infusions:  sodium chloride      [MAR Hold] cefTRIAXone  (ROCEPHIN )  IV 2 g (07/20/24 1102)   heparin  Stopped (07/21/24 0959)   PRN  Meds:.acetaminophen , fentaNYL , Heparin  (Porcine) in NaCl, heparin  sodium (porcine), hydrALAZINE , iodixanol , labetalol , midazolam , ondansetron  (ZOFRAN ) IV  Antimicrobials: Anti-infectives (From admission, onward)    Start     Dose/Rate Route Frequency Ordered Stop   07/20/24 1030  [MAR Hold]  cefTRIAXone  (ROCEPHIN ) 2 g in sodium chloride  0.9 % 100 mL IVPB        (MAR Hold since Mon 07/21/2024 at 0959.Hold Reason: Transfer to a Procedural area)   2 g 200 mL/hr over 30 Minutes Intravenous Every 24 hours 07/20/24 0939     07/18/24 1400  ceFEPIme  (MAXIPIME ) 2 g in sodium chloride  0.9 % 100 mL IVPB  Status:  Discontinued        2 g 200 mL/hr over 30 Minutes Intravenous Every 12 hours 07/18/24 1258 07/20/24 0938   07/18/24 1245  ceFEPIme  (MAXIPIME ) 2 g in sodium chloride  0.9 % 100 mL IVPB  Status:  Discontinued        2 g 200 mL/hr over 30 Minutes Intravenous Every 8 hours 07/18/24 1153 07/18/24 1258        I have personally reviewed the following labs and images: CBC: Recent Labs  Lab 07/17/24 2047 07/18/24 0817 07/19/24 0703 07/20/24 0735 07/21/24 0536  WBC 16.4* 17.1* 14.6* 12.2* 11.7*  NEUTROABS  --  14.0*  --   --   --   HGB 13.0 13.0 12.6* 11.9* 12.2*  HCT 42.5 41.8 40.4 37.9* 38.8*  MCV 81.3 79.6* 79.5* 78.5* 79.7*  PLT 477* 495* 472* 421* 403*   BMP &GFR Recent Labs  Lab 07/17/24 2047 07/18/24 0817 07/19/24 0703 07/20/24 0735 07/21/24 0536  NA 135 134* 134* 135 135  K 4.7 4.8 4.3 4.0 4.5  CL 99 99 98 102 99  CO2 22 22 22  21* 26  GLUCOSE 136* 118* 101* 115* 118*  BUN 21 21 20 16 14   CREATININE 1.19 1.22 1.21 1.07 1.14  CALCIUM  9.3 9.7 9.1 9.1 9.3  MG  --   --  2.2 2.1 2.0  PHOS  --   --  3.6 2.4* 2.6   Estimated Creatinine Clearance: 62.4 mL/min (by C-G formula based on SCr of 1.14 mg/dL). Liver & Pancreas: Recent Labs  Lab 07/19/24 0703 07/20/24 0735 07/21/24 0536  AST 48* 49* 64*  ALT 22 21 23   ALKPHOS 67 64 63  BILITOT 1.0 0.8 0.6  PROT 7.7 7.3 7.4   ALBUMIN  2.4* 2.2* 2.1*   No results for input(s): LIPASE, AMYLASE in the last 168 hours. No results for input(s): AMMONIA in the last 168 hours. Diabetic: No results for input(s): HGBA1C in the last 72 hours. Recent Labs  Lab 07/20/24 0636 07/20/24 1158 07/20/24 1634 07/20/24 2015 07/21/24 0624  GLUCAP 113* 237* 103* 164* 114*   Cardiac Enzymes: Recent Labs  Lab 07/17/24 2047 07/19/24 0703  CKTOTAL 1,583* 1,218*   No results for input(s): PROBNP in the last 8760 hours. Coagulation Profile: Recent Labs  Lab 07/17/24 2047 07/21/24 0536  INR 1.6* 1.3*   Thyroid  Function Tests: No results for input(s): TSH, T4TOTAL, FREET4, T3FREE, THYROIDAB in the last 72 hours. Lipid Profile: No results for input(s): CHOL, HDL, LDLCALC, TRIG, CHOLHDL, LDLDIRECT in the last 72 hours. Anemia Panel: No results for input(s): VITAMINB12, FOLATE, FERRITIN, TIBC, IRON, RETICCTPCT in the last 72 hours. Urine analysis:    Component Value Date/Time   COLORURINE YELLOW 06/29/2022 1821   APPEARANCEUR CLOUDY (A) 06/29/2022 1821   LABSPEC 1.029 06/29/2022 1821   PHURINE 6.0 06/29/2022 1821   GLUCOSEU >=500 (A) 06/29/2022 1821   HGBUR MODERATE (A) 06/29/2022 1821   BILIRUBINUR NEGATIVE 06/29/2022 1821   BILIRUBINUR moderate 07/09/2014 1428   KETONESUR 20 (A) 06/29/2022 1821   PROTEINUR 30 (A) 06/29/2022 1821   UROBILINOGEN 0.2 08/07/2015 0854   NITRITE NEGATIVE 06/29/2022 1821   LEUKOCYTESUR LARGE (A) 06/29/2022 1821   Sepsis Labs: Invalid input(s): PROCALCITONIN, LACTICIDVEN  Microbiology: Recent Results (from the past 240 hours)  MRSA Next Gen by PCR, Nasal     Status: None   Collection Time: 07/18/24 11:54 AM   Specimen: Nasal Mucosa; Nasal Swab  Result Value Ref Range Status   MRSA by PCR Next Gen NOT DETECTED NOT DETECTED Final    Comment: (NOTE) The GeneXpert MRSA Assay (FDA approved for NASAL specimens only), is one component of  a comprehensive MRSA colonization surveillance program. It is not intended to diagnose MRSA infection nor to guide or monitor treatment for MRSA infections. Test performance is not FDA approved in patients less than 3 years old. Performed at [facility], [address] [place], [place] [address]     Radiology Studies: VAS US  ABI WITH/WO TBI Result Date: 07/21/2024  LOWER EXTREMITY DOPPLER STUDY Patient Name:  Karl [name]  Date of Exam:   07/20/2024 Medical Rec #: [id number]      Accession #:    [id number] Date of Birth: March 21, 1956     Patient Gender: M Patient Age:   37 years Exam Location:  [facility] Procedure:      VAS US  ABI WITH/WO TBI Referring Phys: PENNE [place] --------------------------------------------------------------------------------  Indications: Right lower extremity rest pain, foot cold to touch, and weak. High Risk Factors: Diabetes, past history of smoking, coronary artery disease,                    prior CVA. Other Factors: Cardiomyopathy.  Comparison Study: No prior study on file Performing Technologist: Rachel Pellet RVS  Examination Guidelines: A complete evaluation includes at minimum, Doppler waveform signals and systolic blood pressure reading at the level of bilateral brachial, anterior tibial, and posterior tibial arteries, when vessel segments are accessible. Bilateral testing is considered an integral part of a complete examination. Photoelectric Plethysmograph (PPG) waveforms and toe systolic pressure readings are included as required and additional duplex testing as needed. Limited examinations for reoccurring indications may be performed as noted.  ABI Findings: +---------+------------------+-----+---------+--------+ Right    Rt Pressure (mmHg)IndexWaveform Comment  +---------+------------------+-----+---------+--------+ Brachial 115                    triphasic         +---------+------------------+-----+---------+--------+ PTA      0                  0.00 absent            +---------+------------------+-----+---------+--------+ DP       0  0.00 absent            +---------+------------------+-----+---------+--------+ Great Toe0                 0.00 Absent            +---------+------------------+-----+---------+--------+ +---------+------------------+-----+---------+-------+ Left     Lt Pressure (mmHg)IndexWaveform Comment +---------+------------------+-----+---------+-------+ Brachial 113                    triphasic        +---------+------------------+-----+---------+-------+ PTA      180               1.57 triphasic        +---------+------------------+-----+---------+-------+ DP       139               1.21 triphasic        +---------+------------------+-----+---------+-------+ Great Toe41                0.36 Abnormal         +---------+------------------+-----+---------+-------+ +-------+---------------------+-----------+------------+------------+ ABI/TBIToday's ABI          Today's TBIPrevious ABIPrevious TBI +-------+---------------------+-----------+------------+------------+ Right  Absent               Absent                              +-------+---------------------+-----------+------------+------------+ Left   1.57 non compressible0.36                                +-------+---------------------+-----------+------------+------------+  Summary: Right: Resting right ankle-brachial index indicates critical limb ischemia. Left: Resting left ankle-brachial index indicates noncompressible left lower extremity arteries. The left toe-brachial index is abnormal. *See table(s) above for measurements and observations.  Electronically signed by Gaile New MD on 07/21/2024 at 11:46:02 AM.    Final       Dacey Milberger T. Nyella Eckels [facility]  If 7PM-7AM, please contact night-coverage [email or web address] 07/21/2024, 12:41 PM

## 2024-07-21 NOTE — Plan of Care (Signed)

## 2024-07-21 NOTE — Op Note (Signed)
 Patient name: Karl [name] MRN: [id number] DOB: 06/23/1956 Sex: male  07/21/2024 Pre-operative Diagnosis: Right lower extremity CL TI with rest pain Post-operative diagnosis:  Same Surgeon:  Norman GORMAN Serve, MD Procedure Performed:  Ultrasound-guided access of the left common femoral artery Aortogram and bilateral lower extremity angiogram Third order cannulation of right popliteal artery Mynx closure of the left common femoral artery 49 minutes of moderate sedation with fentanyl  and versed    Indications: Mr. Pfeifer is a 68 year old male with CL TI and rest pain on the right lower extremity.  He has a history of stroke with some mild right-sided weakness and has been on anticoagulation for A-fib.  He is diabetic and a former smoker.  A CTA demonstrated severe below the knee disease and his ABI on the right is 0.  Risk and benefits of angiogram with possible intervention were reviewed and he elected to proceed.  Findings:  Widely patent aorta and bilateral renal arteries. Widely patent iliac systems bilaterally.  Right common femoral, profunda and SFA are patent.  There is significant medial calcinosis of the SFA.  The SFA then occludes at the adductor canal.  The flow is extremely slow and the AT is reconstituted by collaterals.  A blind segment of TP trunk is reconstituted.  The AT does flow into the DP on the foot but is very sluggish.  Left common femoral, profunda and SFA are patent.  There is significant medial calcinosis.  The popliteal artery is patent.  There is two-vessel runoff via the peroneal and PT.  The peroneal reconstitutes the AT at the ankle.   Procedure:  The patient was identified in the holding area and taken to the cath lab  The patient was then placed supine on the table and prepped and draped in the usual sterile fashion.  A time out was called.  Ultrasound was used to evaluate the left common femoral artery.  It was patent .  A digital ultrasound image was  acquired.  A micropuncture needle was used to access the left common femoral artery under ultrasound guidance.  An 018 wire was advanced without resistance and a micropuncture sheath was placed.  The 018 wire was removed and a benson wire was placed.  The micropuncture sheath was exchanged for a 5 french sheath.  An omniflush catheter was advanced over the wire to the level of L-1.  An abdominal angiogram was obtained.  Next, using the omniflush catheter and a Bentson wire, the aortic bifurcation was crossed and the catheter was placed into theright external iliac artery and right runoff was obtained. This demonstrated the above findings.  A glide advantage wire was then placed in the catheter into the distal SFA.  The short 5 French sheath was then exchanged for a 5 [place] by 65 cm catapult sheath.  The patient was then systemically heparinized and direct injections in the SFA were used to better visualize the arteries below the knee which demonstrated the above findings.  I attempted to cross the occlusion with the glide advantage wire and a quick cross catheter although this was unsuccessful.  I also attempted to cross with an 014 command wire and CXI catheter although this was also unsuccessful and I was unable to reenter into the AT.  The long 5 French sheath was exchanged for a short 5 French sheath and left runoff was performed via retrograde sheath injections which demonstrated the above findings.  A minx closure device was then deployed at the left femoral  artery access.  Upon removal he did start to develop some hematoma and therefore 15 minutes of manual pressure was held and he will be on bedrest for 4 hours.  Contrast: 80 cc Sedation: 49 minutes    Norman GORMAN Serve MD [facility] Office: [phone]

## 2024-07-21 NOTE — Progress Notes (Signed)
 Pharmacy called to pause heparin  and restart in an hour.

## 2024-07-21 NOTE — Progress Notes (Signed)
  Daily Progress Note Subjective: Right foot pain, ready for procedure  Objective: Vitals:   07/21/24 0457 07/21/24 0815  BP: (!) 134/107 126/82  Pulse: 96 87  Resp: 16 18  Temp: 98.7 F (37.1 C) 98.3 F (36.8 C)  SpO2: 100% 100%    Physical Examination HDS Nonlabored breathing R foot is cool and discolored  ASSESSMENT/PLAN:  68 y.o. male is here with right lower extremity pain secondary to ischemia consistent with ischemic rest pain.  ABI on the right is 0.  Risks and benefits of angiogram with possible intervention were reviewed and he elected to proceed   Norman GORMAN Serve MD [facility] [phone] 07/21/2024  10:11 AM

## 2024-07-21 NOTE — Progress Notes (Addendum)
 PHARMACY - ANTICOAGULATION CONSULT NOTE  Pharmacy Consult for heparin  Indication: Ischemic limb and history of Afib  Allergies  Allergen Reactions   Lipitor [Atorvastatin] Nausea And Vomiting    Patient Measurements: Height: 6' 2 (188 cm) Weight: 72.1 kg (158 lb 15.2 oz) (Wt from 05/28/2024) IBW/kg (Calculated) : 82.2 HEPARIN  DW (KG): 72.1  Vital Signs: Temp: 98 F (36.7 C) (08/18 1455) Temp Source: Oral (08/18 1455) BP: 128/76 (08/18 1455) Pulse Rate: 77 (08/18 1455)  Labs: Recent Labs    07/19/24 0703 07/20/24 0735 07/21/24 0536  HGB 12.6* 11.9* 12.2*  HCT 40.4 37.9* 38.8*  PLT 472* 421* 403*  APTT 96* 81* 131*  LABPROT  --   --  16.6*  INR  --   --  1.3*  HEPARINUNFRC 1.01* 0.84* 0.91*  CREATININE 1.21 1.07 1.14  CKTOTAL 1,218*  --   --     Estimated Creatinine Clearance: 62.4 mL/min (by C-G formula based on SCr of 1.14 mg/dL).   Assessment: 65 yoM presented to ED with right foot pain. Pharmacy consulted to dose heparin  for ischemic limb and history of Afib. PTA Eliquis  on hold, last dose on 8/14 AM.  Pt s/p angio, ok to resume heparin  4h after case end per MD (~1200).  Goal of Therapy:  Heparin  level 0.3-0.7 units/ml aPTT 66-102 seconds Monitor platelets by anticoagulation protocol: Yes   Plan:  Resume heparin  1200 units/h at 1600 Check aPTT in 6h   ADDENDUM 2230: PTT therapeutic, no bleeding issues per RN, continue rate noted above.  Ozell [place], PharmD, BCPS, [facility] Clinical Pharmacist [phone] Please check AMION for all [facility] Pharmacy numbers 07/21/2024

## 2024-07-22 ENCOUNTER — Encounter (HOSPITAL_COMMUNITY): Payer: Self-pay | Admitting: Vascular Surgery

## 2024-07-22 DIAGNOSIS — E1159 Type 2 diabetes mellitus with other circulatory complications: Secondary | ICD-10-CM | POA: Diagnosis not present

## 2024-07-22 DIAGNOSIS — I10 Essential (primary) hypertension: Secondary | ICD-10-CM | POA: Diagnosis not present

## 2024-07-22 DIAGNOSIS — I998 Other disorder of circulatory system: Secondary | ICD-10-CM | POA: Diagnosis not present

## 2024-07-22 DIAGNOSIS — Z8673 Personal history of transient ischemic attack (TIA), and cerebral infarction without residual deficits: Secondary | ICD-10-CM | POA: Diagnosis not present

## 2024-07-22 DIAGNOSIS — Z9889 Other specified postprocedural states: Secondary | ICD-10-CM

## 2024-07-22 LAB — CBC
HCT: 39.9 % (ref 39.0–52.0)
Hemoglobin: 12.5 g/dL — ABNORMAL LOW (ref 13.0–17.0)
MCH: 24.7 pg — ABNORMAL LOW (ref 26.0–34.0)
MCHC: 31.3 g/dL (ref 30.0–36.0)
MCV: 78.9 fL — ABNORMAL LOW (ref 80.0–100.0)
Platelets: 398 K/uL (ref 150–400)
RBC: 5.06 MIL/uL (ref 4.22–5.81)
RDW: 16.1 % — ABNORMAL HIGH (ref 11.5–15.5)
WBC: 12.7 K/uL — ABNORMAL HIGH (ref 4.0–10.5)
nRBC: 0 % (ref 0.0–0.2)

## 2024-07-22 LAB — LIPID PANEL
Cholesterol: 149 mg/dL (ref 0–200)
HDL: 37 mg/dL — ABNORMAL LOW (ref >40)
LDL Cholesterol: 97 mg/dL (ref 0–99)
Total CHOL/HDL Ratio: 4 ratio
Triglycerides: 74 mg/dL (ref <150)
VLDL: 15 mg/dL (ref 0–40)

## 2024-07-22 LAB — COMPREHENSIVE METABOLIC PANEL WITH GFR
ALT: 25 U/L (ref 0–44)
AST: 73 U/L — ABNORMAL HIGH (ref 15–41)
Albumin: 2.2 g/dL — ABNORMAL LOW (ref 3.5–5.0)
Alkaline Phosphatase: 66 U/L (ref 38–126)
Anion gap: 12 (ref 5–15)
BUN: 14 mg/dL (ref 8–23)
CO2: 25 mmol/L (ref 22–32)
Calcium: 9.3 mg/dL (ref 8.9–10.3)
Chloride: 95 mmol/L — ABNORMAL LOW (ref 98–111)
Creatinine, Ser: 1.07 mg/dL (ref 0.61–1.24)
GFR, Estimated: >60 mL/min (ref >60)
Glucose, Bld: 109 mg/dL — ABNORMAL HIGH (ref 70–99)
Potassium: 4.6 mmol/L (ref 3.5–5.1)
Sodium: 132 mmol/L — ABNORMAL LOW (ref 135–145)
Total Bilirubin: 0.5 mg/dL (ref 0.0–1.2)
Total Protein: 7.3 g/dL (ref 6.5–8.1)

## 2024-07-22 LAB — GLUCOSE, CAPILLARY
Glucose-Capillary: 112 mg/dL — ABNORMAL HIGH (ref 70–99)
Glucose-Capillary: 147 mg/dL — ABNORMAL HIGH (ref 70–99)
Glucose-Capillary: 155 mg/dL — ABNORMAL HIGH (ref 70–99)
Glucose-Capillary: 95 mg/dL (ref 70–99)

## 2024-07-22 LAB — MAGNESIUM: Magnesium: 2.1 mg/dL (ref 1.7–2.4)

## 2024-07-22 LAB — APTT: aPTT: 67 s — ABNORMAL HIGH (ref 24–36)

## 2024-07-22 LAB — HEPARIN LEVEL (UNFRACTIONATED): Heparin Unfractionated: 0.4 [IU]/mL (ref 0.30–0.70)

## 2024-07-22 LAB — PHOSPHORUS: Phosphorus: 2.4 mg/dL — ABNORMAL LOW (ref 2.5–4.6)

## 2024-07-22 LAB — C-REACTIVE PROTEIN: CRP: 17.5 mg/dL — ABNORMAL HIGH (ref <1.0)

## 2024-07-22 MED ORDER — SODIUM CHLORIDE 0.9 % IV SOLN
2.0000 g | INTRAVENOUS | Status: DC
  Administered 2024-07-22 – 2024-07-23 (×2): 2 g via INTRAVENOUS
  Filled 2024-07-22 (×2): qty 20

## 2024-07-22 NOTE — Progress Notes (Signed)
 PHARMACY - ANTICOAGULATION CONSULT NOTE  Pharmacy Consult for heparin  Indication: Ischemic limb and history of Afib  Allergies  Allergen Reactions   Lipitor [Atorvastatin] Nausea And Vomiting    Patient Measurements: Height: 6' 2 (188 cm) Weight: 72.1 kg (158 lb 15.2 oz) (Wt from 05/28/2024) IBW/kg (Calculated) : 82.2 HEPARIN  DW (KG): 72.1  Vital Signs: Temp: 97.8 F (36.6 C) (08/19 0830) Temp Source: Oral (08/19 0830) BP: 121/79 (08/19 0910) Pulse Rate: 92 (08/19 0910)  Labs: Recent Labs    07/20/24 0735 07/21/24 0536 07/21/24 2151 07/22/24 0437 07/22/24 0438  HGB 11.9* 12.2*  --  12.5*  --   HCT 37.9* 38.8*  --  39.9  --   PLT 421* 403*  --  398  --   APTT 81* 131* 73* 67*  --   LABPROT  --  16.6*  --   --   --   INR  --  1.3*  --   --   --   HEPARINUNFRC 0.84* 0.91*  --   --  0.40  CREATININE 1.07 1.14  --  1.07  --     Estimated Creatinine Clearance: 66.4 mL/min (by C-G formula based on SCr of 1.07 mg/dL).   Assessment: 49 yoM presented to ED with right foot pain. Pharmacy consulted to dose heparin  for ischemic limb and history of Afib. PTA Eliquis  on hold, last dose on 8/14 AM.  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  Listed under [facility].

## 2024-07-22 NOTE — Plan of Care (Signed)
   Problem: Education: Goal: Knowledge of General Education information will improve Description Including pain rating scale, medication(s)/side effects and non-pharmacologic comfort measures Outcome: Progressing

## 2024-07-22 NOTE — Progress Notes (Signed)
 PROGRESS NOTE  JACKSYN BEEKS FMW:[id number] DOB: Jul 10, 1956   PCP: Geofm Glade PARAS, MD  Patient is from: Home.  Uses rolling walker at baseline but recently started using wheelchair  DOA: 07/17/2024 LOS: 4  Chief complaints Chief Complaint  Patient presents with   R Lower leg pain/swelling     Brief Narrative / Interim history: 68 year old M with PMH of CVA with left-sided weakness, DM-2, systolic CHF/ICM, CAD and LBBB and presented to ED with right foot pain and difficulty ambulating for about 2 weeks, and admitted with ischemic right foot. Usually ambulates with the help of walker and last 2 weeks he has been using the wheelchair.  CT angiogram of lower extremity with poor visualization of the arteries.  Vascular surgery consulted.  Patient was started on IV heparin  and ceftriaxone .    Patient underwent aortogram and lower extremity angiogram that showed sluggish flow in right lower extremity.  Plan for right BKA on 8/20.    Subjective: Seen and examined earlier this morning.  No major events overnight of this morning.  No complaints.  Pain fairly controlled.  Objective: Vitals:   07/22/24 0017 07/22/24 0439 07/22/24 0830 07/22/24 0910  BP: 112/73 116/73 108/78 121/79  Pulse: 92 86 91 92  Resp: 16 17 17    Temp: 98.3 F (36.8 C) 98 F (36.7 C) 97.8 F (36.6 C)   TempSrc: Oral Oral Oral   SpO2: 98% 97% 98%   Weight:      Height:        Examination:  GENERAL: No apparent distress.  Nontoxic. HEENT: MMM.  Vision and hearing grossly intact.  NECK: Supple.  No apparent JVD.  RESP:  No IWOB.  Fair aeration bilaterally. CVS:  RRR. Heart sounds normal.  ABD/GI/GU: BS+. Abd soft, NTND.  MSK/EXT: Significant muscle mass and subcu fat loss.  Cold and purplish RLE. SKIN: As above. NEURO: AA.  Oriented appropriately.  No apparent focal neuro deficit. PSYCH: Calm. Normal affect.   Consultants:  Vascular surgery  Procedures: 8/18-aortogram and lower extremity arteriogram  with sluggish flow in right lower extremity  Microbiology summarized: MRSA PCR screen nonreactive.  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  CT angio with poor visualization of arteries.  Arteriogram with sluggish flow in RLE.  CRP elevated to 16.  Leukocytosis improved. -Continue IV ceftriaxone  and IV heparin  -Plan for right BKA on 8/20. -Pain control -Continue home Crestor .  Chronic systolic CHF/ICM: TTE in 06/2022 with LVEF of 20 to 25%, GH, G1-DD.  Appears euvolemic on exam.  No respiratory distress.  Not on diuretics at home. -Continue home Coreg , Jardiance , losartan  and Crestor  -Closely monitor respiratory and fluid status.  History of CAD/chronic LBBB: No cardiopulmonary symptoms. - Medication as above.  Hx of CVA w/ L sided weakness - Crestor  20 mg PO daily  - IV heparin  drip as above    NIDDM-2 with hyperglycemia: A1c 5.9%. Recent Labs  Lab 07/21/24 0624 07/21/24 1332 07/21/24 1548 07/21/24 2053 07/22/24 0833  GLUCAP 114* 107* 97 174* 112*  - Continue SSI-sensitive.  Hyperlipidemia - Continue home Crestor   Increased nutrient needs Body mass index is 20.41 kg/m. - Consult dietitian          DVT prophylaxis:  On full dose anticoagulation  Code Status: Full code Family Communication: None at bedside Level of care: Progressive Cardiac Status is: Inpatient Remains inpatient appropriate because: Chronic limb threatening ischemia  Final disposition: To be determined   55 minutes with more than 50% spent in reviewing records, counseling patient/family and coordinating care.   Sch Meds:  Scheduled Meds:  aspirin  EC  81 mg Oral Daily   carvedilol   3.125 mg Oral BID WC   insulin  aspart  0-9 Units Subcutaneous TID WC   losartan   25 mg Oral Daily   rosuvastatin   20 mg Oral Daily   sodium chloride  flush  3 mL Intravenous Q12H    Continuous Infusions:  sodium chloride      cefTRIAXone  (ROCEPHIN )  IV     heparin  1,200 Units/hr (07/22/24 0200)   PRN Meds:.sodium chloride , acetaminophen , hydrALAZINE , labetalol , ondansetron  (ZOFRAN ) IV, sodium chloride  flush  Antimicrobials: Anti-infectives (From admission, onward)    Start     Dose/Rate Route Frequency Ordered Stop   07/22/24 1700  cefTRIAXone  (ROCEPHIN ) 2 g in sodium chloride  0.9 % 100 mL IVPB        2 g 200 mL/hr over 30 Minutes Intravenous Every 24 hours 07/22/24 0945     07/20/24 1030  cefTRIAXone  (ROCEPHIN ) 2 g in sodium chloride  0.9 % 100 mL IVPB  Status:  Discontinued        2 g 200 mL/hr over 30 Minutes Intravenous Every 24 hours 07/20/24 0939 07/22/24 0945   07/18/24 1400  ceFEPIme  (MAXIPIME ) 2 g in sodium chloride  0.9 % 100 mL IVPB  Status:  Discontinued        2 g 200 mL/hr over 30 Minutes Intravenous Every 12 hours 07/18/24 1258 07/20/24 0938   07/18/24 1245  ceFEPIme  (MAXIPIME ) 2 g in sodium chloride  0.9 % 100 mL IVPB  Status:  Discontinued        2 g 200 mL/hr over 30 Minutes Intravenous Every 8 hours 07/18/24 1153 07/18/24 1258        I have personally reviewed the following labs and images: CBC: Recent Labs  Lab 07/18/24 0817 07/19/24 0703 07/20/24 0735 07/21/24 0536 07/22/24 0437  WBC 17.1* 14.6* 12.2* 11.7* 12.7*  NEUTROABS 14.0*  --   --   --   --   HGB 13.0 12.6* 11.9* 12.2* 12.5*  HCT 41.8 40.4 37.9* 38.8* 39.9  MCV 79.6* 79.5* 78.5* 79.7* 78.9*  PLT 495* 472* 421* 403* 398   BMP &GFR Recent Labs  Lab 07/18/24 0817 07/19/24 0703 07/20/24 0735 07/21/24 0536 07/22/24 0437  NA 134* 134* 135 135 132*  K 4.8 4.3 4.0 4.5 4.6  CL 99 98 102 99 95*  CO2 22 22 21* 26 25  GLUCOSE 118* 101* 115* 118* 109*  BUN 21 20 16 14 14   CREATININE 1.22 1.21 1.07 1.14 1.07  CALCIUM  9.7 9.1 9.1 9.3 9.3  MG  --  2.2 2.1 2.0 2.1  PHOS  --  3.6 2.4* 2.6 2.4*   Estimated Creatinine Clearance: 66.4 mL/min (by C-G formula based on SCr of  1.07 mg/dL). Liver & Pancreas: Recent Labs  Lab 07/19/24 0703 07/20/24 0735 07/21/24 0536 07/22/24 0437  AST 48* 49* 64* 73*  ALT 22 21 23 25   ALKPHOS 67 64 63 66  BILITOT 1.0 0.8 0.6 0.5  PROT 7.7 7.3 7.4 7.3  ALBUMIN  2.4* 2.2* 2.1* 2.2*   No results for input(s): LIPASE, AMYLASE in the last 168 hours. No results for input(s): AMMONIA in the last 168 hours. Diabetic: No results for input(s): HGBA1C in the last 72 hours. Recent Labs  Lab 07/21/24 0624 07/21/24 1332 07/21/24 1548 07/21/24 2053 07/22/24 0833  GLUCAP 114*  107* 97 174* 112*   Cardiac Enzymes: Recent Labs  Lab 07/17/24 2047 07/19/24 0703  CKTOTAL 1,583* 1,218*   No results for input(s): PROBNP in the last 8760 hours. Coagulation Profile: Recent Labs  Lab 07/17/24 2047 07/21/24 0536  INR 1.6* 1.3*   Thyroid  Function Tests: No results for input(s): TSH, T4TOTAL, FREET4, T3FREE, THYROIDAB in the last 72 hours. Lipid Profile: Recent Labs    07/22/24 0438  CHOL 149  HDL 37*  LDLCALC 97  TRIG 74  CHOLHDL 4.0   Anemia Panel: No results for input(s): VITAMINB12, FOLATE, FERRITIN, TIBC, IRON, RETICCTPCT in the last 72 hours. Urine analysis:    Component Value Date/Time   COLORURINE YELLOW 06/29/2022 1821   APPEARANCEUR CLOUDY (A) 06/29/2022 1821   LABSPEC 1.029 06/29/2022 1821   PHURINE 6.0 06/29/2022 1821   GLUCOSEU >=500 (A) 06/29/2022 1821   HGBUR MODERATE (A) 06/29/2022 1821   BILIRUBINUR NEGATIVE 06/29/2022 1821   BILIRUBINUR moderate 07/09/2014 1428   KETONESUR 20 (A) 06/29/2022 1821   PROTEINUR 30 (A) 06/29/2022 1821   UROBILINOGEN 0.2 08/07/2015 0854   NITRITE NEGATIVE 06/29/2022 1821   LEUKOCYTESUR LARGE (A) 06/29/2022 1821   Sepsis Labs: Invalid input(s): PROCALCITONIN, LACTICIDVEN  Microbiology: Recent Results (from the past 240 hours)  MRSA Next Gen by PCR, Nasal     Status: None   Collection Time: 07/18/24 11:54 AM   Specimen: Nasal  Mucosa; Nasal Swab  Result Value Ref Range Status   MRSA by PCR Next Gen NOT DETECTED NOT DETECTED Final    Comment: (NOTE) The GeneXpert MRSA Assay (FDA approved for NASAL specimens only), is one component of a comprehensive MRSA colonization surveillance program. It is not intended to diagnose MRSA infection nor to guide or monitor treatment for MRSA infections. Test performance is not FDA approved in patients less than 38 years old. Performed at [facility], [address], [place], [place] [address]     Radiology Studies: No results found.     Apostolos Blagg T. Lively Haberman [facility]  If 7PM-7AM, please contact night-coverage [email or web address] 07/22/2024, 12:19 PM

## 2024-07-22 NOTE — Progress Notes (Addendum)
  Progress Note    07/22/2024 8:24 AM 1 Day Post-Op  Subjective:  right foot pain unchanged   Vitals:   07/22/24 0017 07/22/24 0439  BP: 112/73 116/73  Pulse: 92 86  Resp: 16 17  Temp: 98.3 F (36.8 C) 98 F (36.7 C)  SpO2: 98% 97%   Physical Exam: Cardiac:  regular Lungs:  non labored Extremities: left CF access site c/d/I without swelling or hematoma. right leg cool from just below knee. Decreased motor Abdomen:  soft Neurologic: alert and oriented  CBC    Component Value Date/Time   WBC 12.7 (H) 07/22/2024 0437   RBC 5.06 07/22/2024 0437   HGB 12.5 (L) 07/22/2024 0437   HCT 39.9 07/22/2024 0437   PLT 398 07/22/2024 0437   MCV 78.9 (L) 07/22/2024 0437   MCH 24.7 (L) 07/22/2024 0437   MCHC 31.3 07/22/2024 0437   RDW 16.1 (H) 07/22/2024 0437   LYMPHSABS 1.3 07/18/2024 0817   MONOABS 1.7 (H) 07/18/2024 0817   EOSABS 0.0 07/18/2024 0817   BASOSABS 0.0 07/18/2024 0817    BMET    Component Value Date/Time   NA 132 (L) 07/22/2024 0437   NA 135 12/25/2017 1143   K 4.6 07/22/2024 0437   CL 95 (L) 07/22/2024 0437   CO2 25 07/22/2024 0437   GLUCOSE 109 (H) 07/22/2024 0437   BUN 14 07/22/2024 0437   BUN 15 12/25/2017 1143   CREATININE 1.07 07/22/2024 0437   CREATININE 1.69 (H) 07/06/2020 1009   CALCIUM  9.3 07/22/2024 0437   GFRNONAA >60 07/22/2024 0437   GFRNONAA 42 (L) 07/06/2020 1009   GFRAA 48 (L) 07/06/2020 1009    INR    Component Value Date/Time   INR 1.3 (H) 07/21/2024 0536   INR 8.0 05/22/2024 1112     Intake/Output Summary (Last 24 hours) at 07/22/2024 9175 Last data filed at 07/22/2024 0442 Gross per 24 hour  Intake 135.19 ml  Output 400 ml  Net -264.81 ml     Assessment/Plan:  68 y.o. male is s/p Aortogram, arteriogram of BLE 1 Day Post-Op   Unfortunately no revascularization options for right lower extremity Discussed with patient that he has best opportunity for healing with a right above knee amputation He seems understanding and  says he is  okay with whatever needs to happen Dr. Serene to talk further with patient and his family He will need to be NPO after midnight Consent order placed    Teretha Damme, PA-C [facility] [phone] 07/22/2024 8:24 AM   I agree with the above.  I have seen and evaluated the patient.  His leg is clearly not viable.  He does not have options for revascularization.  He continues to have severe rest pain.  The leg is demarcated at the level of the mid calf and he has significant calf tenderness.  I discussed with him that I think the best option is a right above-knee amputation.  He understands this and wants to proceed.  I will try to speak with his family later on this afternoon when they come in.  Malvina Serene

## 2024-07-22 NOTE — H&P (View-Only) (Signed)
  Progress Note    07/22/2024 8:24 AM 1 Day Post-Op  Subjective:  right foot pain unchanged   Vitals:   07/22/24 0017 07/22/24 0439  BP: 112/73 116/73  Pulse: 92 86  Resp: 16 17  Temp: 98.3 F (36.8 C) 98 F (36.7 C)  SpO2: 98% 97%   Physical Exam: Cardiac:  regular Lungs:  non labored Extremities: left CF access site c/d/I without swelling or hematoma. right leg cool from just below knee. Decreased motor Abdomen:  soft Neurologic: alert and oriented  CBC    Component Value Date/Time   WBC 12.7 (H) 07/22/2024 0437   RBC 5.06 07/22/2024 0437   HGB 12.5 (L) 07/22/2024 0437   HCT 39.9 07/22/2024 0437   PLT 398 07/22/2024 0437   MCV 78.9 (L) 07/22/2024 0437   MCH 24.7 (L) 07/22/2024 0437   MCHC 31.3 07/22/2024 0437   RDW 16.1 (H) 07/22/2024 0437   LYMPHSABS 1.3 07/18/2024 0817   MONOABS 1.7 (H) 07/18/2024 0817   EOSABS 0.0 07/18/2024 0817   BASOSABS 0.0 07/18/2024 0817    BMET    Component Value Date/Time   NA 132 (L) 07/22/2024 0437   NA 135 12/25/2017 1143   K 4.6 07/22/2024 0437   CL 95 (L) 07/22/2024 0437   CO2 25 07/22/2024 0437   GLUCOSE 109 (H) 07/22/2024 0437   BUN 14 07/22/2024 0437   BUN 15 12/25/2017 1143   CREATININE 1.07 07/22/2024 0437   CREATININE 1.69 (H) 07/06/2020 1009   CALCIUM  9.3 07/22/2024 0437   GFRNONAA >60 07/22/2024 0437   GFRNONAA 42 (L) 07/06/2020 1009   GFRAA 48 (L) 07/06/2020 1009    INR    Component Value Date/Time   INR 1.3 (H) 07/21/2024 0536   INR 8.0 05/22/2024 1112     Intake/Output Summary (Last 24 hours) at 07/22/2024 9175 Last data filed at 07/22/2024 0442 Gross per 24 hour  Intake 135.19 ml  Output 400 ml  Net -264.81 ml     Assessment/Plan:  68 y.o. male is s/p Aortogram, arteriogram of BLE 1 Day Post-Op   Unfortunately no revascularization options for right lower extremity Discussed with patient that he has best opportunity for healing with a right above knee amputation He seems understanding and  says he is  okay with whatever needs to happen Dr. Serene to talk further with patient and his family He will need to be NPO after midnight Consent order placed    Teretha Damme, PA-C [facility] [phone] 07/22/2024 8:24 AM   I agree with the above.  I have seen and evaluated the patient.  His leg is clearly not viable.  He does not have options for revascularization.  He continues to have severe rest pain.  The leg is demarcated at the level of the mid calf and he has significant calf tenderness.  I discussed with him that I think the best option is a right above-knee amputation.  He understands this and wants to proceed.  I will try to speak with his family later on this afternoon when they come in.  Malvina Serene

## 2024-07-22 NOTE — Progress Notes (Signed)
 PHARMACIST LIPID MONITORING   Karl [name] is a 68 y.o. male admitted on 07/17/2024 with PVD.  Pharmacy has been consulted to optimize lipid-lowering therapy with the indication of secondary prevention for clinical ASCVD.  Recent Labs:  Lipid Panel (last 6 months):   Lab Results  Component Value Date   CHOL 149 07/22/2024   TRIG 74 07/22/2024   HDL 37 (L) 07/22/2024   CHOLHDL 4.0 07/22/2024   VLDL 15 07/22/2024   LDLCALC 97 07/22/2024    Hepatic function panel (last 6 months):   Lab Results  Component Value Date   AST 73 (H) 07/22/2024   ALT 25 07/22/2024   ALKPHOS 66 07/22/2024   BILITOT 0.5 07/22/2024    SCr (since admission):   Serum creatinine: 1.07 mg/dL 91/80/74 9562 Estimated creatinine clearance: 66.4 mL/min  Current therapy and lipid therapy tolerance Current lipid-lowering therapy: Crestor  20mg /d Previous lipid-lowering therapies (if applicable): Atorvastatin Documented or reported allergies or intolerances to lipid-lowering therapies (if applicable): nausea with atorvastatin  Assessment:   Patient prefers no changes in lipid-lowering therapy at this time due to previous medication intolerances  Plan:    1.Statin intensity (high intensity recommended for all patients regardless of the LDL):  No statin changes. The patient is already on a high intensity statin.  2.Add ezetimibe (if any one of the following):    -He does not want any changes in medications at this time  3.Refer to lipid clinic:   Yes  4.Follow-up with:  Lipid clinic referral.  5.Follow-up labs after discharge:  No changes in lipid therapy, repeat a lipid panel in one year.     Prentice Poisson, PharmD Clinical Pharmacist **Pharmacist phone directory can now be found on [email or web address] ([email or web address]).  Listed under [facility].

## 2024-07-23 ENCOUNTER — Encounter (HOSPITAL_COMMUNITY)
Admission: EM | Disposition: A | Discharge status: Skilled Nursing Facility | Payer: Self-pay | Source: Home / Self Care | Attending: Student

## 2024-07-23 ENCOUNTER — Encounter (HOSPITAL_COMMUNITY): Payer: Self-pay | Admitting: Internal Medicine

## 2024-07-23 ENCOUNTER — Inpatient Hospital Stay (HOSPITAL_COMMUNITY): Payer: Self-pay

## 2024-07-23 ENCOUNTER — Other Ambulatory Visit: Payer: Self-pay

## 2024-07-23 DIAGNOSIS — Z8673 Personal history of transient ischemic attack (TIA), and cerebral infarction without residual deficits: Secondary | ICD-10-CM | POA: Diagnosis not present

## 2024-07-23 DIAGNOSIS — I110 Hypertensive heart disease with heart failure: Secondary | ICD-10-CM

## 2024-07-23 DIAGNOSIS — I10 Essential (primary) hypertension: Secondary | ICD-10-CM | POA: Diagnosis not present

## 2024-07-23 DIAGNOSIS — I2510 Atherosclerotic heart disease of native coronary artery without angina pectoris: Secondary | ICD-10-CM

## 2024-07-23 DIAGNOSIS — I998 Other disorder of circulatory system: Secondary | ICD-10-CM | POA: Diagnosis not present

## 2024-07-23 DIAGNOSIS — I70221 Atherosclerosis of native arteries of extremities with rest pain, right leg: Secondary | ICD-10-CM

## 2024-07-23 DIAGNOSIS — I5022 Chronic systolic (congestive) heart failure: Secondary | ICD-10-CM

## 2024-07-23 DIAGNOSIS — E1159 Type 2 diabetes mellitus with other circulatory complications: Secondary | ICD-10-CM | POA: Diagnosis not present

## 2024-07-23 HISTORY — PX: AMPUTATION: SHX166

## 2024-07-23 LAB — PHOSPHORUS: Phosphorus: 2.4 mg/dL — ABNORMAL LOW (ref 2.5–4.6)

## 2024-07-23 LAB — CBC
HCT: 36.5 % — ABNORMAL LOW (ref 39.0–52.0)
Hemoglobin: 11.7 g/dL — ABNORMAL LOW (ref 13.0–17.0)
MCH: 25.2 pg — ABNORMAL LOW (ref 26.0–34.0)
MCHC: 32.1 g/dL (ref 30.0–36.0)
MCV: 78.7 fL — ABNORMAL LOW (ref 80.0–100.0)
Platelets: 352 K/uL (ref 150–400)
RBC: 4.64 MIL/uL (ref 4.22–5.81)
RDW: 15.9 % — ABNORMAL HIGH (ref 11.5–15.5)
WBC: 11.8 K/uL — ABNORMAL HIGH (ref 4.0–10.5)
nRBC: 0 % (ref 0.0–0.2)

## 2024-07-23 LAB — COMPREHENSIVE METABOLIC PANEL WITH GFR
ALT: 24 U/L (ref 0–44)
AST: 64 U/L — ABNORMAL HIGH (ref 15–41)
Albumin: 2.1 g/dL — ABNORMAL LOW (ref 3.5–5.0)
Alkaline Phosphatase: 60 U/L (ref 38–126)
Anion gap: 9 (ref 5–15)
BUN: 14 mg/dL (ref 8–23)
CO2: 23 mmol/L (ref 22–32)
Calcium: 8.7 mg/dL — ABNORMAL LOW (ref 8.9–10.3)
Chloride: 100 mmol/L (ref 98–111)
Creatinine, Ser: 1.03 mg/dL (ref 0.61–1.24)
GFR, Estimated: >60 mL/min (ref >60)
Glucose, Bld: 129 mg/dL — ABNORMAL HIGH (ref 70–99)
Potassium: 3.8 mmol/L (ref 3.5–5.1)
Sodium: 132 mmol/L — ABNORMAL LOW (ref 135–145)
Total Bilirubin: 0.7 mg/dL (ref 0.0–1.2)
Total Protein: 6.8 g/dL (ref 6.5–8.1)

## 2024-07-23 LAB — CK: Total CK: 1938 U/L — ABNORMAL HIGH (ref 49–397)

## 2024-07-23 LAB — GLUCOSE, CAPILLARY
Glucose-Capillary: 131 mg/dL — ABNORMAL HIGH (ref 70–99)
Glucose-Capillary: 101 mg/dL — ABNORMAL HIGH (ref 70–99)
Glucose-Capillary: 100 mg/dL — ABNORMAL HIGH (ref 70–99)
Glucose-Capillary: 128 mg/dL — ABNORMAL HIGH (ref 70–99)
Glucose-Capillary: 267 mg/dL — ABNORMAL HIGH (ref 70–99)

## 2024-07-23 LAB — HEPARIN LEVEL (UNFRACTIONATED): Heparin Unfractionated: 0.45 [IU]/mL (ref 0.30–0.70)

## 2024-07-23 LAB — MAGNESIUM: Magnesium: 2 mg/dL (ref 1.7–2.4)

## 2024-07-23 LAB — C-REACTIVE PROTEIN: CRP: 14.4 mg/dL — ABNORMAL HIGH (ref <1.0)

## 2024-07-23 SURGERY — AMPUTATION, ABOVE KNEE
Anesthesia: General | Site: Knee | Laterality: Right

## 2024-07-23 MED ORDER — PHENOL 1.4 % MT LIQD: 1.0000 | OROMUCOSAL | Status: DC | PRN

## 2024-07-23 MED ORDER — ALBUMIN HUMAN 5 % IV SOLN
INTRAVENOUS | Status: AC
  Filled 2024-07-23: qty 250

## 2024-07-23 MED ORDER — BUPIVACAINE LIPOSOME 1.3 % IJ SUSP
INTRAMUSCULAR | Status: DC | PRN
  Administered 2024-07-23: 100 mL

## 2024-07-23 MED ORDER — POTASSIUM CHLORIDE CRYS ER 20 MEQ PO TBCR: 40.0000 meq | EXTENDED_RELEASE_TABLET | Freq: Every day | ORAL | Status: DC | PRN

## 2024-07-23 MED ORDER — SODIUM CHLORIDE (PF) 0.9 % IJ SOLN
INTRAMUSCULAR | Status: AC
  Filled 2024-07-23: qty 50

## 2024-07-23 MED ORDER — CEFAZOLIN SODIUM 1 G IJ SOLR
INTRAMUSCULAR | Status: AC
  Filled 2024-07-23: qty 20

## 2024-07-23 MED ORDER — LIDOCAINE 2% (20 MG/ML) 5 ML SYRINGE
INTRAMUSCULAR | Status: DC | PRN
  Administered 2024-07-23: 60 mg via INTRAVENOUS

## 2024-07-23 MED ORDER — 0.9 % SODIUM CHLORIDE (POUR BTL) OPTIME
TOPICAL | Status: DC | PRN
Start: 2024-07-23 — End: 2024-07-23
  Administered 2024-07-23: 1000 mL

## 2024-07-23 MED ORDER — METOPROLOL TARTRATE 5 MG/5ML IV SOLN: 2.0000 mg | INTRAVENOUS | Status: DC | PRN

## 2024-07-23 MED ORDER — PROPOFOL 10 MG/ML IV BOLUS
INTRAVENOUS | Status: DC | PRN
  Administered 2024-07-23: 100 mg via INTRAVENOUS

## 2024-07-23 MED ORDER — BUPIVACAINE HCL (PF) 0.5 % IJ SOLN
INTRAMUSCULAR | Status: AC
  Filled 2024-07-23: qty 30

## 2024-07-23 MED ORDER — ALBUMIN HUMAN 5 % IV SOLN
12.5000 g | Freq: Once | INTRAVENOUS | Status: AC
  Administered 2024-07-23: 12.5 g via INTRAVENOUS

## 2024-07-23 MED ORDER — DEXAMETHASONE SODIUM PHOSPHATE 10 MG/ML IJ SOLN
INTRAMUSCULAR | Status: AC
  Filled 2024-07-23: qty 1

## 2024-07-23 MED ORDER — HYDROCODONE-ACETAMINOPHEN 7.5-325 MG PO TABS
1.0000 | ORAL_TABLET | ORAL | Status: DC | PRN
  Administered 2024-07-28: 2 via ORAL
  Filled 2024-07-23: qty 2

## 2024-07-23 MED ORDER — MORPHINE SULFATE (PF) 2 MG/ML IV SOLN
0.5000 mg | INTRAVENOUS | Status: DC | PRN
  Administered 2024-07-25: 1 mg via INTRAVENOUS
  Filled 2024-07-23: qty 1

## 2024-07-23 MED ORDER — PHENYLEPHRINE 80 MCG/ML (10ML) SYRINGE FOR IV PUSH (FOR BLOOD PRESSURE SUPPORT)
PREFILLED_SYRINGE | INTRAVENOUS | Status: AC
  Filled 2024-07-23: qty 10

## 2024-07-23 MED ORDER — SUGAMMADEX SODIUM 200 MG/2ML IV SOLN
INTRAVENOUS | Status: DC | PRN
  Administered 2024-07-23 (×2): 200 mg via INTRAVENOUS

## 2024-07-23 MED ORDER — FENTANYL CITRATE (PF) 250 MCG/5ML IJ SOLN
INTRAMUSCULAR | Status: AC
  Filled 2024-07-23: qty 5

## 2024-07-23 MED ORDER — LACTATED RINGERS IV SOLN: INTRAVENOUS | Status: DC

## 2024-07-23 MED ORDER — BISACODYL 5 MG PO TBEC: 5.0000 mg | DELAYED_RELEASE_TABLET | Freq: Every day | ORAL | Status: DC | PRN

## 2024-07-23 MED ORDER — ORAL CARE MOUTH RINSE: 15.0000 mL | Freq: Once | OROMUCOSAL | Status: AC

## 2024-07-23 MED ORDER — EPHEDRINE SULFATE (PRESSORS) 50 MG/ML IJ SOLN
INTRAMUSCULAR | Status: DC | PRN
  Administered 2024-07-23 (×2): 5 mg via INTRAVENOUS

## 2024-07-23 MED ORDER — ALBUTEROL SULFATE HFA 108 (90 BASE) MCG/ACT IN AERS
INHALATION_SPRAY | RESPIRATORY_TRACT | Status: DC | PRN
  Administered 2024-07-23: 4 via RESPIRATORY_TRACT

## 2024-07-23 MED ORDER — HEPARIN (PORCINE) 25000 UT/250ML-% IV SOLN: 1250.0000 [IU]/h | INTRAVENOUS | Status: DC

## 2024-07-23 MED ORDER — PHENYLEPHRINE HCL-NACL 20-0.9 MG/250ML-% IV SOLN
INTRAVENOUS | Status: DC | PRN
Start: 2024-07-23 — End: 2024-07-23
  Administered 2024-07-23: 30 ug/min via INTRAVENOUS

## 2024-07-23 MED ORDER — HYDROCODONE-ACETAMINOPHEN 5-325 MG PO TABS
1.0000 | ORAL_TABLET | ORAL | Status: DC | PRN
  Administered 2024-07-25 – 2024-07-26 (×2): 1 via ORAL
  Filled 2024-07-23 (×2): qty 1

## 2024-07-23 MED ORDER — DOCUSATE SODIUM 100 MG PO CAPS
100.0000 mg | ORAL_CAPSULE | Freq: Every day | ORAL | Status: DC
  Administered 2024-07-24 – 2024-07-31 (×8): 100 mg via ORAL
  Filled 2024-07-23 (×7): qty 1

## 2024-07-23 MED ORDER — DEXAMETHASONE SODIUM PHOSPHATE 10 MG/ML IJ SOLN
INTRAMUSCULAR | Status: DC | PRN
  Administered 2024-07-23: 5 mg via INTRAVENOUS

## 2024-07-23 MED ORDER — CEFAZOLIN SODIUM-DEXTROSE 2-3 GM-%(50ML) IV SOLR
INTRAVENOUS | Status: DC | PRN
  Administered 2024-07-23: 2 g via INTRAVENOUS

## 2024-07-23 MED ORDER — ONDANSETRON HCL 4 MG/2ML IJ SOLN
INTRAMUSCULAR | Status: AC
  Filled 2024-07-23: qty 2

## 2024-07-23 MED ORDER — MIDAZOLAM HCL 2 MG/2ML IJ SOLN
INTRAMUSCULAR | Status: DC | PRN
  Administered 2024-07-23 (×2): 1 mg via INTRAVENOUS

## 2024-07-23 MED ORDER — ROCURONIUM BROMIDE 10 MG/ML (PF) SYRINGE
PREFILLED_SYRINGE | INTRAVENOUS | Status: DC | PRN
  Administered 2024-07-23: 50 mg via INTRAVENOUS
  Administered 2024-07-23: 30 mg via INTRAVENOUS

## 2024-07-23 MED ORDER — CHLORHEXIDINE GLUCONATE 0.12 % MT SOLN
15.0000 mL | Freq: Once | OROMUCOSAL | Status: AC
  Administered 2024-07-23: 15 mL via OROMUCOSAL

## 2024-07-23 MED ORDER — MIDAZOLAM HCL 2 MG/2ML IJ SOLN
INTRAMUSCULAR | Status: AC
  Filled 2024-07-23: qty 2

## 2024-07-23 MED ORDER — PROPOFOL 10 MG/ML IV BOLUS
INTRAVENOUS | Status: AC
  Filled 2024-07-23: qty 20

## 2024-07-23 MED ORDER — SENNOSIDES-DOCUSATE SODIUM 8.6-50 MG PO TABS: 1.0000 | ORAL_TABLET | Freq: Every evening | ORAL | Status: DC | PRN

## 2024-07-23 MED ORDER — BUPIVACAINE LIPOSOME 1.3 % IJ SUSP
INTRAMUSCULAR | Status: AC
  Filled 2024-07-23: qty 20

## 2024-07-23 MED ORDER — FENTANYL CITRATE (PF) 250 MCG/5ML IJ SOLN
INTRAMUSCULAR | Status: DC | PRN
  Administered 2024-07-23 (×3): 50 ug via INTRAVENOUS

## 2024-07-23 MED ORDER — ACETAMINOPHEN 500 MG PO TABS
500.0000 mg | ORAL_TABLET | Freq: Four times a day (QID) | ORAL | Status: AC
  Administered 2024-07-23 – 2024-07-24 (×4): 500 mg via ORAL
  Filled 2024-07-23 (×4): qty 1

## 2024-07-23 MED ORDER — PHENYLEPHRINE 80 MCG/ML (10ML) SYRINGE FOR IV PUSH (FOR BLOOD PRESSURE SUPPORT)
PREFILLED_SYRINGE | INTRAVENOUS | Status: DC | PRN
  Administered 2024-07-23: 80 ug via INTRAVENOUS
  Administered 2024-07-23 (×2): 240 ug via INTRAVENOUS
  Administered 2024-07-23: 160 ug via INTRAVENOUS
  Administered 2024-07-23: 240 ug via INTRAVENOUS
  Administered 2024-07-23 (×2): 160 ug via INTRAVENOUS

## 2024-07-23 MED ORDER — ONDANSETRON HCL 4 MG/2ML IJ SOLN
INTRAMUSCULAR | Status: DC | PRN
  Administered 2024-07-23: 4 mg via INTRAVENOUS

## 2024-07-23 SURGICAL SUPPLY — 49 items
BAG COUNTER SPONGE SURGICOUNT (BAG) ×1 IMPLANT
BLADE SAW GIGLI 510 (BLADE) ×1 IMPLANT
BNDG COHESIVE 6X5 TAN ST LF (GAUZE/BANDAGES/DRESSINGS) ×1 IMPLANT
BNDG ELASTIC 4INX 5YD STR LF (GAUZE/BANDAGES/DRESSINGS) IMPLANT
BNDG ELASTIC 4X5.8 VLCR STR LF (GAUZE/BANDAGES/DRESSINGS) ×1 IMPLANT
BNDG ELASTIC 6INX 5YD STR LF (GAUZE/BANDAGES/DRESSINGS) ×1 IMPLANT
BNDG ELASTIC 6X10 VLCR STRL LF (GAUZE/BANDAGES/DRESSINGS) IMPLANT
BNDG GAUZE DERMACEA FLUFF 4 (GAUZE/BANDAGES/DRESSINGS) ×2 IMPLANT
BNDG STRETCH 4X75 STRL LF (GAUZE/BANDAGES/DRESSINGS) IMPLANT
CANISTER SUCTION 3000ML PPV (SUCTIONS) ×1 IMPLANT
CLIP TI MEDIUM 6 (CLIP) ×1 IMPLANT
CNTNR URN SCR LID CUP LEK RST (MISCELLANEOUS) ×1 IMPLANT
COVER SURGICAL LIGHT HANDLE (MISCELLANEOUS) ×1 IMPLANT
DRAIN CHANNEL 19F RND (DRAIN) IMPLANT
DRAPE DERMATAC (DRAPES) IMPLANT
DRAPE HALF SHEET 40X57 (DRAPES) ×1 IMPLANT
DRAPE INCISE IOBAN 66X45 STRL (DRAPES) IMPLANT
DRAPE SURG ORHT 6 SPLT 77X108 (DRAPES) ×2 IMPLANT
DRSG ADAPTIC 3X8 NADH LF (GAUZE/BANDAGES/DRESSINGS) ×1 IMPLANT
ELECT CAUTERY BLADE 6.4 (BLADE) ×1 IMPLANT
ELECTRODE REM PT RTRN 9FT ADLT (ELECTROSURGICAL) ×1 IMPLANT
EVACUATOR SILICONE 100CC (DRAIN) IMPLANT
GAUZE 4X4 16PLY ~~LOC~~+RFID DBL (SPONGE) ×1 IMPLANT
GAUZE SPONGE 4X4 12PLY STRL (GAUZE/BANDAGES/DRESSINGS) ×2 IMPLANT
GAUZE SPONGE 4X4 12PLY STRL LF (GAUZE/BANDAGES/DRESSINGS) IMPLANT
GLOVE SURG SS PI 7.5 STRL IVOR (GLOVE) ×3 IMPLANT
GOWN STRL REUS W/ TWL LRG LVL3 (GOWN DISPOSABLE) ×2 IMPLANT
GOWN STRL REUS W/ TWL XL LVL3 (GOWN DISPOSABLE) ×1 IMPLANT
KIT BASIN OR (CUSTOM PROCEDURE TRAY) ×1 IMPLANT
KIT TURNOVER KIT B (KITS) ×1 IMPLANT
NDL 18GX1X1/2 (RX/OR ONLY) (NEEDLE) IMPLANT
NDL HYPO 25GX1X1/2 BEV (NEEDLE) ×1 IMPLANT
NEEDLE 18GX1X1/2 (RX/OR ONLY) (NEEDLE) ×1 IMPLANT
NEEDLE HYPO 25GX1X1/2 BEV (NEEDLE) ×1 IMPLANT
NS IRRIG 1000ML POUR BTL (IV SOLUTION) ×1 IMPLANT
PACK GENERAL/GYN (CUSTOM PROCEDURE TRAY) ×1 IMPLANT
PAD ARMBOARD POSITIONER FOAM (MISCELLANEOUS) ×2 IMPLANT
RASP HELIOCORDIAL MED (MISCELLANEOUS) IMPLANT
STAPLER SKIN PROX 35W (STAPLE) ×1 IMPLANT
STOCKINETTE IMPERVIOUS LG (DRAPES) ×1 IMPLANT
SUT ETHILON 3 0 PS 1 (SUTURE) IMPLANT
SUT SILK 0 TIES 10X30 (SUTURE) ×1 IMPLANT
SUT SILK 2-0 18XBRD TIE 12 (SUTURE) IMPLANT
SUT SILK 3-0 18XBRD TIE 12 (SUTURE) IMPLANT
SUT VIC AB 2-0 CT1 18 (SUTURE) ×2 IMPLANT
SYR CONTROL 10ML LL (SYRINGE) ×1 IMPLANT
TOWEL GREEN STERILE (TOWEL DISPOSABLE) ×1 IMPLANT
UNDERPAD 30X36 HEAVY ABSORB (UNDERPADS AND DIAPERS) ×1 IMPLANT
WATER STERILE IRR 1000ML POUR (IV SOLUTION) ×1 IMPLANT

## 2024-07-23 NOTE — Anesthesia Procedure Notes (Signed)
 Arterial Line Insertion Start/End8/20/2025 2:40 PM, 07/23/2024 2:40 PM Performed by: Merla Almarie HERO, DO, anesthesiologist  Patient location: OR. Preanesthetic checklist: patient identified, IV checked, site marked, risks and benefits discussed, surgical consent, monitors and equipment checked, pre-op evaluation, timeout performed and anesthesia consent Lidocaine  1% used for infiltration Left, radial was placed Catheter size: 20 G Hand hygiene performed  and maximum sterile barriers used   Attempts: 1 Procedure performed without using ultrasound guided technique. Following insertion, dressing applied. Post procedure assessment: normal and unchanged  Post procedure complications: second provider assisted. Patient tolerated the procedure well with no immediate complications.

## 2024-07-23 NOTE — Op Note (Signed)
    Patient name: Karl [name] MRN: [id number] DOB: 1956/05/15 Sex: male  07/17/2024 - 07/23/2024 Pre-operative Diagnosis: Ischemic right leg Post-operative diagnosis:  Same Surgeon:  Malvina New Assistants:  KYM Damme, PA Procedure:   Right above-knee potation Anesthesia: General Blood Loss: Minimal Specimens: Right leg  Findings: Viable muscle at amputation site  Indications: This is a 68 year old male who initially presented with a 3-week history of right leg pain.  On exam he did not have motor or sensory function and it was felt to be ischemic.  He underwent angiography that did not show options for revascularization.  His leg is clearly nonviable and we discussed above-knee amputation and he agreed.  Procedure:  The patient was identified in the holding area and taken to [facility] OR ROOM 12  The patient was then placed supine on the table. general anesthesia was administered.  The patient was prepped and draped in the usual sterile fashion.  A time out was called and antibiotics were administered.  A PA was necessary to expedite the procedure and assist with technical details.  She helped with exposure by providing suction and retraction.  She helped with ligation of the vessels and wound closure.  A fishmouth incision was made just proximal to the patella.  Cautery is used divide subcutaneous tissue down to the muscle which was also divided with cautery.  The femur was then circumferentially exposed.  Periosteal elevator was used to elevate the periosteum and a Gigli saw was used to transect the bone, beveling the anterior surface.  Next, cautery was used to divide the remaining muscle.  The neurovascular bundle was divided between hemostats and the leg was removed and sent off as a specimen.  I then dissected out the neurovascular bundle and ligated the artery nerve vein individually, proximal to the cut edge of the femur.  The nerve was infiltrated with Exparel .  Next the wound was  irrigated.  Hemostasis was achieved.  I used a rasp to smooth the bone surface.  Exparel  was then placed in the subcutaneous tissue.  The fascia was reapproximated with interrupted 2-0 Vicryl.  Skin was closed staples.  Sterile dressings were applied.  There are no immediate complications.   Disposition: To PACU stable.   ALONSO Malvina New, M.D., [facility] [place] Office: [phone] Pager:  [phone]

## 2024-07-23 NOTE — Progress Notes (Signed)
 PROGRESS NOTE  Karl [name] FMW:[id number] DOB: 12-06-1955   PCP: Geofm Glade PARAS, MD  Patient is from: Home.  Uses rolling walker at baseline but recently started using wheelchair  DOA: 07/17/2024 LOS: 5  Chief complaints Chief Complaint  Patient presents with   R Lower leg pain/swelling     Brief Narrative / Interim history: 68 year old M with PMH of CVA with left-sided weakness, DM-2, systolic CHF/ICM, CAD and LBBB and presented to ED with right foot pain and difficulty ambulating for about 2 weeks, and admitted with ischemic right foot. Usually ambulates with the help of walker and last 2 weeks he has been using the wheelchair.  CT angiogram of lower extremity with poor visualization of the arteries.  Vascular surgery consulted.  Patient was started on IV heparin  and ceftriaxone .    Patient underwent aortogram and lower extremity angiogram that showed sluggish flow in right lower extremity.  Plan for right BKA on 8/20.    Subjective: Seen and examined earlier this morning.  No major events overnight of this morning.  No complaints.  Pain fairly controlled.  Objective: Vitals:   07/22/24 1921 07/23/24 0022 07/23/24 0421 07/23/24 0840  BP:  112/76  100/75  Pulse: 85 82  88  Resp: 16 18 17    Temp: 98.5 F (36.9 C) 97.8 F (36.6 C) 97.6 F (36.4 C)   TempSrc: Oral Oral Oral   SpO2: 93% 98%    Weight:      Height:        Examination:  GENERAL: No apparent distress.  Nontoxic. HEENT: MMM.  Vision and hearing grossly intact.  NECK: Supple.  No apparent JVD.  RESP:  No IWOB.  Fair aeration bilaterally. CVS:  RRR. Heart sounds normal.  ABD/GI/GU: BS+. Abd soft, NTND.  MSK/EXT: Significant muscle mass and subcu fat loss.  Cold and purplish RLE. SKIN: As above. NEURO: AA.  Oriented appropriately.  No apparent focal neuro deficit. PSYCH: Calm. Normal affect.   Consultants:  Vascular surgery  Procedures: 8/18-aortogram and lower extremity arteriogram with sluggish  flow in right lower extremity  Microbiology summarized: MRSA PCR screen nonreactive.  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  CT angio with poor visualization of arteries.  Arteriogram with sluggish flow in RLE.  CRP elevated to 16.  Leukocytosis improved.  CK elevated to 2000. -Continue IV ceftriaxone  and IV heparin  -Plan for right BKA on 8/20. -Pain control -Hold Crestor  with elevated CK  Chronic systolic CHF/ICM: TTE in 06/2022 with LVEF of 20 to 25%, GH, G1-DD.  Appears euvolemic on exam.  No respiratory distress.  Not on diuretics at home. -Continue home Coreg , Jardiance  and losartan . -Closely monitor respiratory and fluid status.  History of CAD/chronic LBBB: No cardiopulmonary symptoms. - Medication as above.  Hx of CVA w/ L sided weakness - IV heparin  drip as above   - Hold Crestor .  NIDDM-2 with hyperglycemia: A1c 5.9%. Recent Labs  Lab 07/22/24 0833 07/22/24 1228 07/22/24 1549 07/22/24 2120 07/23/24 0747  GLUCAP 112* 147* 155* 95 131*  - Continue SSI-sensitive.  Hyperlipidemia -Hold Crestor  in the setting of myonecrosis.  Myonecrosis: Likely due to critical limb ischemia. - Hold Crestor . - Recheck CK after surgery  Increased nutrient needs Body mass index is 20.41 kg/m. - Dietitian consulted.          DVT prophylaxis:  On full dose anticoagulation  Code Status: Full code Family  Communication: Updated patient's daughter at bedside. Level of care: Progressive Cardiac Status is: Inpatient Remains inpatient appropriate because: Chronic limb threatening ischemia   Final disposition: To be determined   35 minutes with more than 50% spent in reviewing records, counseling patient/family and coordinating care.   Sch Meds:  Scheduled Meds:  aspirin  EC  81 mg Oral Daily   carvedilol   3.125 mg Oral BID WC   insulin  aspart  0-9 Units  Subcutaneous TID WC   losartan   25 mg Oral Daily   rosuvastatin   20 mg Oral Daily   sodium chloride  flush  3 mL Intravenous Q12H   Continuous Infusions:  cefTRIAXone  (ROCEPHIN )  IV Stopped (07/22/24 1855)   heparin  1,200 Units/hr (07/22/24 2200)   PRN Meds:.acetaminophen , hydrALAZINE , labetalol , ondansetron  (ZOFRAN ) IV, sodium chloride  flush  Antimicrobials: Anti-infectives (From admission, onward)    Start     Dose/Rate Route Frequency Ordered Stop   07/22/24 1700  cefTRIAXone  (ROCEPHIN ) 2 g in sodium chloride  0.9 % 100 mL IVPB        2 g 200 mL/hr over 30 Minutes Intravenous Every 24 hours 07/22/24 0945     07/20/24 1030  cefTRIAXone  (ROCEPHIN ) 2 g in sodium chloride  0.9 % 100 mL IVPB  Status:  Discontinued        2 g 200 mL/hr over 30 Minutes Intravenous Every 24 hours 07/20/24 0939 07/22/24 0945   07/18/24 1400  ceFEPIme  (MAXIPIME ) 2 g in sodium chloride  0.9 % 100 mL IVPB  Status:  Discontinued        2 g 200 mL/hr over 30 Minutes Intravenous Every 12 hours 07/18/24 1258 07/20/24 0938   07/18/24 1245  ceFEPIme  (MAXIPIME ) 2 g in sodium chloride  0.9 % 100 mL IVPB  Status:  Discontinued        2 g 200 mL/hr over 30 Minutes Intravenous Every 8 hours 07/18/24 1153 07/18/24 1258        I have personally reviewed the following labs and images: CBC: Recent Labs  Lab 07/18/24 0817 07/19/24 0703 07/20/24 0735 07/21/24 0536 07/22/24 0437 07/23/24 0422  WBC 17.1* 14.6* 12.2* 11.7* 12.7* 11.8*  NEUTROABS 14.0*  --   --   --   --   --   HGB 13.0 12.6* 11.9* 12.2* 12.5* 11.7*  HCT 41.8 40.4 37.9* 38.8* 39.9 36.5*  MCV 79.6* 79.5* 78.5* 79.7* 78.9* 78.7*  PLT 495* 472* 421* 403* 398 352   BMP &GFR Recent Labs  Lab 07/19/24 0703 07/20/24 0735 07/21/24 0536 07/22/24 0437 07/23/24 0422  NA 134* 135 135 132* 132*  K 4.3 4.0 4.5 4.6 3.8  CL 98 102 99 95* 100  CO2 22 21* 26 25 23   GLUCOSE 101* 115* 118* 109* 129*  BUN 20 16 14 14 14   CREATININE 1.21 1.07 1.14 1.07 1.03   CALCIUM  9.1 9.1 9.3 9.3 8.7*  MG 2.2 2.1 2.0 2.1 2.0  PHOS 3.6 2.4* 2.6 2.4* 2.4*   Estimated Creatinine Clearance: 69 mL/min (by C-G formula based on SCr of 1.03 mg/dL). Liver & Pancreas: Recent Labs  Lab 07/19/24 0703 07/20/24 0735 07/21/24 0536 07/22/24 0437 07/23/24 0422  AST 48* 49* 64* 73* 64*  ALT 22 21 23 25 24   ALKPHOS 67 64 63 66 60  BILITOT 1.0 0.8 0.6 0.5 0.7  PROT 7.7 7.3 7.4 7.3 6.8  ALBUMIN  2.4* 2.2* 2.1* 2.2* 2.1*   No results for input(s): LIPASE, AMYLASE in the last 168 hours. No results for input(s): AMMONIA in the  last 168 hours. Diabetic: No results for input(s): HGBA1C in the last 72 hours. Recent Labs  Lab 07/22/24 0833 07/22/24 1228 07/22/24 1549 07/22/24 2120 07/23/24 0747  GLUCAP 112* 147* 155* 95 131*   Cardiac Enzymes: Recent Labs  Lab 07/17/24 2047 07/19/24 0703 07/23/24 0422  CKTOTAL 1,583* 1,218* 1,938*   No results for input(s): PROBNP in the last 8760 hours. Coagulation Profile: Recent Labs  Lab 07/17/24 2047 07/21/24 0536  INR 1.6* 1.3*   Thyroid  Function Tests: No results for input(s): TSH, T4TOTAL, FREET4, T3FREE, THYROIDAB in the last 72 hours. Lipid Profile: Recent Labs    07/22/24 0438  CHOL 149  HDL 37*  LDLCALC 97  TRIG 74  CHOLHDL 4.0   Anemia Panel: No results for input(s): VITAMINB12, FOLATE, FERRITIN, TIBC, IRON, RETICCTPCT in the last 72 hours. Urine analysis:    Component Value Date/Time   COLORURINE YELLOW 06/29/2022 1821   APPEARANCEUR CLOUDY (A) 06/29/2022 1821   LABSPEC 1.029 06/29/2022 1821   PHURINE 6.0 06/29/2022 1821   GLUCOSEU >=500 (A) 06/29/2022 1821   HGBUR MODERATE (A) 06/29/2022 1821   BILIRUBINUR NEGATIVE 06/29/2022 1821   BILIRUBINUR moderate 07/09/2014 1428   KETONESUR 20 (A) 06/29/2022 1821   PROTEINUR 30 (A) 06/29/2022 1821   UROBILINOGEN 0.2 08/07/2015 0854   NITRITE NEGATIVE 06/29/2022 1821   LEUKOCYTESUR LARGE (A) 06/29/2022 1821    Sepsis Labs: Invalid input(s): PROCALCITONIN, LACTICIDVEN  Microbiology: Recent Results (from the past 240 hours)  MRSA Next Gen by PCR, Nasal     Status: None   Collection Time: 07/18/24 11:54 AM   Specimen: Nasal Mucosa; Nasal Swab  Result Value Ref Range Status   MRSA by PCR Next Gen NOT DETECTED NOT DETECTED Final    Comment: (NOTE) The GeneXpert MRSA Assay (FDA approved for NASAL specimens only), is one component of a comprehensive MRSA colonization surveillance program. It is not intended to diagnose MRSA infection nor to guide or monitor treatment for MRSA infections. Test performance is not FDA approved in patients less than 50 years old. Performed at [facility], [address] [place], [place] [address]     Radiology Studies: No results found.     Ismael Treptow T. Wladyslaw Henrichs [facility]  If 7PM-7AM, please contact night-coverage [email or web address] 07/23/2024, 10:11 AM

## 2024-07-23 NOTE — Anesthesia Preprocedure Evaluation (Addendum)
 Anesthesia Evaluation  Patient identified by MRN, date of birth, ID band Patient awake    Reviewed: Allergy & Precautions, H&P , NPO status , Patient's Chart, lab work & pertinent test results, reviewed documented beta blocker date and time   Airway Mallampati: II  TM Distance: >3 FB Neck ROM: Full    Dental  (+) Edentulous Upper, Missing, Dental Advisory Given   Pulmonary former smoker Quit smoking 2002, 10 pack year history  Hoarseness- chronic   Pulmonary exam normal breath sounds clear to auscultation       Cardiovascular hypertension, Pt. on medications and Pt. on home beta blockers + CAD, + Cardiac Stents (1998), + Peripheral Vascular Disease (RLE critical limb ischemia) and +CHF (LVEF 20-25%)  Normal cardiovascular exam Rhythm:Regular Rate:Normal  Echo 2023 1. No definitive LV thrombus identified. Left ventricular ejection  fraction, by estimation, is 20 to 25%. The left ventricle has severely  decreased function. The left ventricle demonstrates global hypokinesis.  The left ventricular internal cavity size  was moderately dilated. Left ventricular diastolic parameters are  consistent with Grade I diastolic dysfunction (impaired relaxation).   2. Right ventricular systolic function is normal. The right ventricular  size is normal. Tricuspid regurgitation signal is inadequate for assessing  PA pressure.   3. The mitral valve is normal in structure. No evidence of mitral valve  regurgitation.   4. The aortic valve is tricuspid. Aortic valve regurgitation is not  visualized.   5. Aneurysm of the ascending aorta, measuring 42 mm.   6. The inferior vena cava is normal in size with greater than 50%  respiratory variability, suggesting right atrial pressure of 3 mmHg.     Neuro/Psych CVA (2019), No Residual Symptoms  negative psych ROS   GI/Hepatic negative GI ROS, Neg liver ROS,,,  Endo/Other  diabetes, Well Controlled,  Type 2, Insulin  Dependent  A1c 5.9  Renal/GU negative Renal ROS  negative genitourinary   Musculoskeletal negative musculoskeletal ROS (+)    Abdominal   Peds negative pediatric ROS (+)  Hematology  (+) Blood dyscrasia, anemia Hb 11.7, plt 352   Anesthesia Other Findings   Reproductive/Obstetrics negative OB ROS                              Anesthesia Physical Anesthesia Plan  ASA: 4  Anesthesia Plan: General   Post-op Pain Management: Tylenol  PO (pre-op)*   Induction: Intravenous  PONV Risk Score and Plan: 2 and Ondansetron , Dexamethasone , Midazolam  and Treatment may vary due to age or medical condition  Airway Management Planned: Oral ETT  Additional Equipment: ClearSight  Intra-op Plan:   Post-operative Plan: Extubation in OR  Informed Consent: I have reviewed the patients History and Physical, chart, labs and discussed the procedure including the risks, benefits and alternatives for the proposed anesthesia with the patient or authorized representative who has indicated his/her understanding and acceptance.     Dental advisory given  Plan Discussed with: CRNA  Anesthesia Plan Comments: (Last airway note: Ventilation: Mask ventilation without difficulty and Mask ventilation throughout procedure Laryngoscope Size: Mac and 4 Grade View: Grade II Tube type: Oral Number of attempts: 1  Full code )         Anesthesia Quick Evaluation

## 2024-07-23 NOTE — Anesthesia Procedure Notes (Deleted)
 Arterial Line Insertion Start/End8/20/2025 2:32 PM, 07/23/2024 2:35 PM Performed by: Merla Almarie HERO, DO, anesthesiologist  Patient location: OR. Preanesthetic checklist: patient identified, IV checked, site marked, risks and benefits discussed, surgical consent, monitors and equipment checked, pre-op evaluation, timeout performed and anesthesia consent Lidocaine  1% used for infiltration Left, radial was placed Catheter size: 20 G Hand hygiene performed , maximum sterile barriers used  and Seldinger technique used Allen's test indicative of satisfactory collateral circulation Attempts: 2 Procedure performed without using ultrasound guided technique. Following insertion, dressing applied. Post procedure assessment: normal and unchanged  Patient tolerated the procedure well with no immediate complications.

## 2024-07-23 NOTE — Interval H&P Note (Signed)
 History and Physical Interval Note:  07/23/2024 1:44 PM  Karl [name]  has presented today for surgery, with the diagnosis of CLI RLE.  The various methods of treatment have been discussed with the patient and family. After consideration of risks, benefits and other options for treatment, the patient has consented to  Procedure(s): AMPUTATION, ABOVE KNEE (Right) as a surgical intervention.  The patient's history has been reviewed, patient examined, no change in status, stable for surgery.  I have reviewed the patient's chart and labs.  Questions were answered to the patient's satisfaction.     Malvina New

## 2024-07-23 NOTE — Anesthesia Postprocedure Evaluation (Signed)
 Anesthesia Post Note  Patient: KIET GEER  Procedure(s) Performed: AMPUTATION, ABOVE KNEE (Right: Knee)     Patient location during evaluation: PACU Anesthesia Type: General Level of consciousness: awake and alert, oriented and patient cooperative Pain management: pain level controlled Vital Signs Assessment: post-procedure vital signs reviewed and stable Respiratory status: spontaneous breathing, nonlabored ventilation and respiratory function stable Cardiovascular status: blood pressure returned to baseline and stable Postop Assessment: no apparent nausea or vomiting Anesthetic complications: no Comments: Mild hypotension treated with albumin    No notable events documented.  Last Vitals:  Vitals:   07/23/24 1600 07/23/24 1615  BP: (!) 86/59 (!) 95/59  Pulse: 84 71  Resp: (!) 21 18  Temp:    SpO2: 98% 97%    Last Pain:  Vitals:   07/23/24 1249  TempSrc:   PainSc: 5                  Almarie CHRISTELLA Marchi

## 2024-07-23 NOTE — Transfer of Care (Signed)
 Immediate Anesthesia Transfer of Care Note  Patient: Karl [name]  Procedure(s) Performed: AMPUTATION, ABOVE KNEE (Right: Knee)  Patient Location: PACU  Anesthesia Type:General  Level of Consciousness: awake, alert , oriented, and patient cooperative  Airway & Oxygen Therapy: Patient Spontanous Breathing and Patient connected to face mask oxygen  Post-op Assessment: Report given to RN and Post -op Vital signs reviewed and stable  Post vital signs: Reviewed and stable  Last Vitals:  Vitals Value Taken Time  BP 116/74 07/23/24 15:47  Temp    Pulse 79 07/23/24 15:49  Resp 18 07/23/24 15:49  SpO2 99 % 07/23/24 15:49  Vitals shown include unfiled device data.  Last Pain:  Vitals:   07/23/24 1249  TempSrc:   PainSc: 5       Patients Stated Pain Goal: 0 (07/21/24 2000)  Complications: No notable events documented.

## 2024-07-23 NOTE — Progress Notes (Signed)
 PHARMACY - ANTICOAGULATION CONSULT NOTE  Pharmacy Consult for heparin  Indication: Ischemic limb and history of Afib  Allergies  Allergen Reactions   Lipitor [Atorvastatin] Nausea And Vomiting    Patient Measurements: Height: 6' 2 (188 cm) Weight: 68 kg (150 lb) IBW/kg (Calculated) : 82.2 HEPARIN  DW (KG): 68  Vital Signs: Temp: 98.3 F (36.8 C) (08/20 1630) Temp Source: Oral (08/20 1224) BP: 98/62 (08/20 1630) Pulse Rate: 80 (08/20 1630)  Labs: Recent Labs    07/21/24 0536 07/21/24 2151 07/22/24 0437 07/22/24 0438 07/23/24 0422  HGB 12.2*  --  12.5*  --  11.7*  HCT 38.8*  --  39.9  --  36.5*  PLT 403*  --  398  --  352  APTT 131* 73* 67*  --   --   LABPROT 16.6*  --   --   --   --   INR 1.3*  --   --   --   --   HEPARINUNFRC 0.91*  --   --  0.40 0.45  CREATININE 1.14  --  1.07  --  1.03  CKTOTAL  --   --   --   --  1,938*    Estimated Creatinine Clearance: 65.1 mL/min (by C-G formula based on SCr of 1.03 mg/dL).   Assessment: 27 yoM presented to ED with right foot pain. Pharmacy consulted to dose heparin  for ischemic limb and history of Afib. PTA Eliquis  on hold, last dose on 8/14 AM.  Pt s/p angio and heparin  resumed 8/18 -aPTT= 67 and at goal on 1200 units/hr -aPTT and heparin  level appear to be correlating -CBC stable -plans noted for R AKA on 8/20 - heparin  was stopped prior to OR.  Pharmacy asked to resume 8/21 at 0800 AM.  Goal of Therapy:  Heparin  level 0.3-0.7 units/ml aPTT 66-102 seconds Monitor platelets by anticoagulation protocol: Yes   Plan:  -Resume heparin  1200 units/hr on 8/21 at 0800 per orders. -Check heparin  level 8 hrs after gtt resumes. -Daily heparin  level and CBC -d/c daily aPTT  Harlene Barlow, [employer], [facility] Clinical Pharmacist  07/23/2024 4:58 PM   [facility] pharmacy phone numbers are listed on [email or web address]

## 2024-07-23 NOTE — Anesthesia Procedure Notes (Signed)
 Procedure Name: Intubation Date/Time: 07/23/2024 2:38 PM  Performed by: Julien Manus, CRNAPre-anesthesia Checklist: Patient identified, Emergency Drugs available, Suction available and Patient being monitored Patient Re-evaluated:Patient Re-evaluated prior to induction Oxygen Delivery Method: Circle system utilized Preoxygenation: Pre-oxygenation with 100% oxygen Induction Type: IV induction Ventilation: Mask ventilation without difficulty Laryngoscope Size: Mac and 4 Grade View: Grade I Tube type: Oral Tube size: 7.5 mm Number of attempts: 1 Airway Equipment and Method: Stylet and Oral airway Placement Confirmation: ETT inserted through vocal cords under direct vision, positive ETCO2 and breath sounds checked- equal and bilateral Secured at: 21 cm Tube secured with: Tape Dental Injury: Teeth and Oropharynx as per pre-operative assessment

## 2024-07-23 NOTE — Plan of Care (Signed)
  Problem: Education: Goal: Knowledge of General Education information will improve Description: Including pain rating scale, medication(s)/side effects and non-pharmacologic comfort measures Outcome: Progressing   Problem: Clinical Measurements: Goal: Ability to maintain clinical measurements within normal limits will improve Outcome: Progressing Goal: Will remain free from infection Outcome: Progressing Goal: Diagnostic test results will improve Outcome: Progressing Goal: Respiratory complications will improve Outcome: Progressing Goal: Cardiovascular complication will be avoided Outcome: Progressing   Problem: Activity: Goal: Risk for activity intolerance will decrease Outcome: Progressing   Problem: Nutrition: Goal: Adequate nutrition will be maintained Outcome: Progressing   Problem: Coping: Goal: Level of anxiety will decrease Outcome: Progressing   Problem: Pain Managment: Goal: General experience of comfort will improve and/or be controlled Outcome: Progressing

## 2024-07-24 ENCOUNTER — Encounter (HOSPITAL_COMMUNITY): Payer: Self-pay | Admitting: Surgery

## 2024-07-24 DIAGNOSIS — Z89611 Acquired absence of right leg above knee: Secondary | ICD-10-CM

## 2024-07-24 DIAGNOSIS — S78112A Complete traumatic amputation at level between left hip and knee, initial encounter: Secondary | ICD-10-CM

## 2024-07-24 DIAGNOSIS — E1159 Type 2 diabetes mellitus with other circulatory complications: Secondary | ICD-10-CM | POA: Diagnosis not present

## 2024-07-24 DIAGNOSIS — Z8673 Personal history of transient ischemic attack (TIA), and cerebral infarction without residual deficits: Secondary | ICD-10-CM | POA: Diagnosis not present

## 2024-07-24 DIAGNOSIS — I10 Essential (primary) hypertension: Secondary | ICD-10-CM | POA: Diagnosis not present

## 2024-07-24 DIAGNOSIS — I998 Other disorder of circulatory system: Secondary | ICD-10-CM | POA: Diagnosis not present

## 2024-07-24 LAB — COMPREHENSIVE METABOLIC PANEL WITH GFR
ALT: 19 U/L (ref 0–44)
AST: 41 U/L (ref 15–41)
Albumin: 2 g/dL — ABNORMAL LOW (ref 3.5–5.0)
Alkaline Phosphatase: 50 U/L (ref 38–126)
Anion gap: 9 (ref 5–15)
BUN: 13 mg/dL (ref 8–23)
CO2: 25 mmol/L (ref 22–32)
Calcium: 9 mg/dL (ref 8.9–10.3)
Chloride: 97 mmol/L — ABNORMAL LOW (ref 98–111)
Creatinine, Ser: 0.97 mg/dL (ref 0.61–1.24)
GFR, Estimated: >60 mL/min (ref >60)
Glucose, Bld: 148 mg/dL — ABNORMAL HIGH (ref 70–99)
Potassium: 4.5 mmol/L (ref 3.5–5.1)
Sodium: 131 mmol/L — ABNORMAL LOW (ref 135–145)
Total Bilirubin: 0.4 mg/dL (ref 0.0–1.2)
Total Protein: 6.3 g/dL — ABNORMAL LOW (ref 6.5–8.1)

## 2024-07-24 LAB — CBC
HCT: 34.9 % — ABNORMAL LOW (ref 39.0–52.0)
Hemoglobin: 11 g/dL — ABNORMAL LOW (ref 13.0–17.0)
MCH: 24.9 pg — ABNORMAL LOW (ref 26.0–34.0)
MCHC: 31.5 g/dL (ref 30.0–36.0)
MCV: 79.1 fL — ABNORMAL LOW (ref 80.0–100.0)
Platelets: 304 K/uL (ref 150–400)
RBC: 4.41 MIL/uL (ref 4.22–5.81)
RDW: 15.8 % — ABNORMAL HIGH (ref 11.5–15.5)
WBC: 9.4 K/uL (ref 4.0–10.5)
nRBC: 0 % (ref 0.0–0.2)

## 2024-07-24 LAB — MAGNESIUM: Magnesium: 2 mg/dL (ref 1.7–2.4)

## 2024-07-24 LAB — CK: Total CK: 696 U/L — ABNORMAL HIGH (ref 49–397)

## 2024-07-24 LAB — GLUCOSE, CAPILLARY
Glucose-Capillary: 106 mg/dL — ABNORMAL HIGH (ref 70–99)
Glucose-Capillary: 168 mg/dL — ABNORMAL HIGH (ref 70–99)
Glucose-Capillary: 131 mg/dL — ABNORMAL HIGH (ref 70–99)
Glucose-Capillary: 151 mg/dL — ABNORMAL HIGH (ref 70–99)

## 2024-07-24 LAB — HEPARIN LEVEL (UNFRACTIONATED): Heparin Unfractionated: 0.22 [IU]/mL — ABNORMAL LOW (ref 0.30–0.70)

## 2024-07-24 MED ORDER — JUVEN PO PACK
1.0000 | PACK | Freq: Two times a day (BID) | ORAL | Status: DC
  Administered 2024-07-24 – 2024-07-31 (×15): 1 via ORAL
  Filled 2024-07-24 (×15): qty 1

## 2024-07-24 MED ORDER — PROSOURCE PLUS PO LIQD
30.0000 mL | Freq: Two times a day (BID) | ORAL | Status: DC
  Administered 2024-07-25 – 2024-07-31 (×13): 30 mL via ORAL
  Filled 2024-07-24 (×13): qty 30

## 2024-07-24 MED ORDER — POLYETHYLENE GLYCOL 3350 17 G PO PACK
17.0000 g | PACK | Freq: Two times a day (BID) | ORAL | Status: AC
  Administered 2024-07-24 (×2): 17 g via ORAL
  Filled 2024-07-24 (×2): qty 1

## 2024-07-24 MED ORDER — ENSURE PLUS HIGH PROTEIN PO LIQD
237.0000 mL | Freq: Two times a day (BID) | ORAL | Status: DC
  Administered 2024-07-24 (×2): 237 mL via ORAL

## 2024-07-24 MED ORDER — POLYETHYLENE GLYCOL 3350 17 G PO PACK: 17.0000 g | PACK | Freq: Two times a day (BID) | ORAL | Status: DC | PRN

## 2024-07-24 MED ORDER — ENSURE PLUS HIGH PROTEIN PO LIQD
237.0000 mL | Freq: Three times a day (TID) | ORAL | Status: DC
  Administered 2024-07-24 – 2024-07-31 (×20): 237 mL via ORAL

## 2024-07-24 MED ORDER — ADULT MULTIVITAMIN W/MINERALS CH
1.0000 | ORAL_TABLET | Freq: Every day | ORAL | Status: DC
  Administered 2024-07-24 – 2024-07-31 (×8): 1 via ORAL
  Filled 2024-07-24 (×8): qty 1

## 2024-07-24 MED ORDER — SENNOSIDES-DOCUSATE SODIUM 8.6-50 MG PO TABS: 2.0000 | ORAL_TABLET | Freq: Two times a day (BID) | ORAL | Status: DC | PRN

## 2024-07-24 MED ORDER — SENNOSIDES-DOCUSATE SODIUM 8.6-50 MG PO TABS
1.0000 | ORAL_TABLET | Freq: Two times a day (BID) | ORAL | Status: AC
  Administered 2024-07-24 (×2): 1 via ORAL
  Filled 2024-07-24 (×2): qty 1

## 2024-07-24 MED FILL — Lidocaine HCl Local Preservative Free (PF) Inj 1%: INTRAMUSCULAR | Qty: 30 | Status: AC

## 2024-07-24 NOTE — Progress Notes (Signed)
 Initial Nutrition Assessment  DOCUMENTATION CODES:   Severe malnutrition in context of chronic illness  INTERVENTION:  -Liberalize diet to regular menu, regular texture, thin liquids  -Add Ensure Plus High Protein TID -Add Magic Cup TID -Add ProSource BID -Add MVI -In depth discussion regarding importance of adequate kcal/pro intake for healing, fueling body, etc.    NUTRITION DIAGNOSIS:   Severe Malnutrition related to chronic illness, decreased appetite as evidenced by severe muscle depletion, severe fat depletion.  GOAL:   Patient will meet greater than or equal to 90% of their needs   MONITOR:   PO intake, Skin, Supplement acceptance, Labs, Weight trends  REASON FOR ASSESSMENT:   Consult Assessment of nutrition requirement/status  ASSESSMENT:   Hx CVA with left-sided weakness, DM-2, systolic CHF/ICM, CAD and LBBB and presented to ED with right foot pain and difficulty ambulating for about 2 weeks, and admitted with ischemic right foot.  Spoke to pt at bedside. Pt denies n/v/c/d or chewing/swallowing difficulties. Last BM 8/18. Pt with decreased appetite long term. Pt with stated loss of 50 lbs x 1 yr, however, chart does not reflect this timeline. Pt states UBW was 250-260 lbs (per chart in 2010s), now 150 lbs (prior to AKA). NFPE completed (see below), pt with severe protein calorie malnutrition. Add Ensure plus high protein TID, Juven BID, Magic Cup TID, ProSource BID, MVI. In depth discussion regarding importance of adequate kcal/pro intake, increasing PO intake as able. Pt verbalized understanding. Pt denies additional questions/concerns at this time, will continue to monitor, RDN available prn.   Labs Na 131 BG 100-267 Phos 2.4 Mg 2.0 Albumin  2.0 CRP 14.4 H/H 11/34.9 A1c 5.9  Medications  aspirin  EC  81 mg Oral Daily   carvedilol   3.125 mg Oral BID WC   docusate sodium   100 mg Oral Daily   feeding supplement  237 mL Oral TID BM   insulin  aspart  0-9  Units Subcutaneous TID WC   losartan   25 mg Oral Daily   multivitamin with minerals  1 tablet Oral Daily   nutrition supplement (JUVEN)  1 packet Oral BID BM   polyethylene glycol  17 g Oral BID   senna-docusate  1 tablet Oral BID   sodium chloride  flush  3 mL Intravenous Q12H     NUTRITION - FOCUSED PHYSICAL EXAM:  Flowsheet Row Most Recent Value  Orbital Region Severe depletion  Upper Arm Region Severe depletion  Thoracic and Lumbar Region Severe depletion  Buccal Region Severe depletion  Temple Region Severe depletion  Clavicle Bone Region Severe depletion  Clavicle and Acromion Bone Region Severe depletion  Scapular Bone Region Severe depletion  Dorsal Hand Severe depletion  Patellar Region Severe depletion  Anterior Thigh Region Severe depletion  Posterior Calf Region Severe depletion  Edema (RD Assessment) Mild  Hair Reviewed  Eyes Reviewed  Mouth Reviewed  Skin Reviewed  Nails Reviewed    Diet Order:   Diet Order             Diet regular Room service appropriate? Yes; Fluid consistency: Thin  Diet effective now                   EDUCATION NEEDS:   Education needs have been addressed  Skin:  Skin Assessment: Skin Integrity Issues: Skin Integrity Issues:: Incisions Incisions: Right AKA  Last BM:  8/18  Height:   Ht Readings from Last 1 Encounters:  07/23/24 6' 2 (1.88 m)    Weight:   Wt Readings  from Last 1 Encounters:  07/23/24 68 kg    Ideal Body Weight:   86.4 kg  BMI:  Body mass index is 19.26 kg/m.  Estimated Nutritional Needs:   Kcal:  1825-2250 kcal  Protein:  100-135 g  Fluid:  >/=1.8L  Arlen Legendre Daml-Budig, RDN, LDN Registered Dietitian Nutritionist RD Inpatient Contact Info in [place]

## 2024-07-24 NOTE — Evaluation (Signed)
 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 PMhx:ICM, LBBB, HLD, BPH, CAD, CVA with Lt weakness, CKD, T2DM, CHF, HTN  Clinical Impression  Pt pleasant and very agreeable to mobility. Pt lives alone with sister next door and was caring for himself PTA with transition from walking to [facility] mobility with RLE pain. Pt desires prosthesis in the future and educated for ROM and exercises of RLE to maintain strength and function. Pt educated for lateral scoot transfers, equipment setup and will work toward future standing trials. Pt with decreased strength, function and mobility who will benefit from acute therapy to maximize mobility, safety and independence. Patient will benefit from intensive inpatient follow-up therapy, >3 hours/day         If plan is discharge home, recommend the following: A little help with walking and/or transfers;A little help with bathing/dressing/bathroom;Assistance with cooking/housework;Assist for transportation   Can travel by private vehicle        Equipment Recommendations Other (comment) (tub bench)  Recommendations for Other Services  Rehab consult;OT consult    Functional Status Assessment Patient has had a recent decline in their functional status and demonstrates the ability to make significant improvements in function in a reasonable and predictable amount of time.     Precautions / Restrictions Precautions Precautions: Fall;Other (comment) Recall of Precautions/Restrictions: Impaired Precaution/Restrictions Comments: left weakness, Rt AKA      Mobility  Bed Mobility Overal bed mobility: Modified Independent             General bed mobility comments: increased time with bed flat and use of rail to pivot to EOB    Transfers Overall transfer level: Needs assistance   Transfers: Bed  to chair/wheelchair/BSC, Sit to/from Stand Sit to Stand: Max assist          Lateral/Scoot Transfers: Min assist General transfer comment: left knee blocked from regular surface pt able to begin elevation but could not fully rise on LLE to standing. Min assist with pad to scoot fully to chair, pt heavily reliant on RUE to support weight to scoot with assist for setup    Ambulation/Gait               General Gait Details: unable  Stairs            Wheelchair Mobility     Tilt Bed    Modified Rankin (Stroke Patients Only)       Balance Overall balance assessment: Needs assistance Sitting-balance support: No upper extremity supported, Feet supported Sitting balance-Leahy Scale: Fair                                       Pertinent Vitals/Pain Pain Assessment Pain Assessment: No/denies pain    Home Living Family/patient expects to be discharged to:: Private residence Living Arrangements: Alone Available Help at Discharge: Family;Available PRN/intermittently Type of Home: Apartment         Home Layout: One level Home Equipment: BSC/3in1;Wheelchair Financial trader (4 wheels)      Prior Function Prior Level of Function : Needs assist             Mobility Comments: was walking with RW 1 week PTA and since that time transferring to [facility] ADLs Comments: sister lives next door and  does the driving and IaDLs, pt performs ADls independently and sponge bathes     Extremity/Trunk Assessment   Upper Extremity Assessment Upper Extremity Assessment: Defer to OT evaluation    Lower Extremity Assessment Lower Extremity Assessment: LLE deficits/detail LLE Deficits / Details: grossly 3/5    Cervical / Trunk Assessment Cervical / Trunk Assessment: Normal  Communication   Communication Communication: No apparent difficulties    Cognition Arousal: Alert Behavior During Therapy: WFL for tasks assessed/performed   PT - Cognitive  impairments: No apparent impairments                         Following commands: Intact       Cueing Cueing Techniques: Verbal cues     General Comments      Exercises Amputee Exercises Hip Extension: Supine, 5 reps, Right (isometric) Hip ABduction/ADduction: AROM, Right, 10 reps, Supine   Assessment/Plan    PT Assessment Patient needs continued PT services  PT Problem List Decreased activity tolerance;Decreased balance;Decreased mobility;Decreased strength;Decreased knowledge of use of DME       PT Treatment Interventions DME instruction;Balance training;Functional mobility training;Therapeutic activities;Therapeutic exercise;Patient/family education;Neuromuscular re-education;Wheelchair mobility training    PT Goals (Current goals can be found in the Care Plan section)  Acute Rehab PT Goals Patient Stated Goal: return home PT Goal Formulation: With patient Time For Goal Achievement: 08/07/24 Potential to Achieve Goals: Good    Frequency Min 3X/week     Co-evaluation               AM-PAC PT 6 Clicks Mobility  Outcome Measure Help needed turning from your back to your side while in a flat bed without using bedrails?: None Help needed moving from lying on your back to sitting on the side of a flat bed without using bedrails?: A Little Help needed moving to and from a bed to a chair (including a wheelchair)?: A Little Help needed standing up from a chair using your arms (e.g., wheelchair or bedside chair)?: A Little Help needed to walk in hospital room?: Total Help needed climbing 3-5 steps with a railing? : Total 6 Click Score: 15    End of Session Equipment Utilized During Treatment: Gait belt Activity Tolerance: Patient tolerated treatment well Patient left: in chair;with call bell/phone within reach;with chair alarm set Nurse Communication: Mobility status PT Visit Diagnosis: Other abnormalities of gait and mobility (R26.89);Muscle weakness  (generalized) (M62.81);Difficulty in walking, not elsewhere classified (R26.2)    Time: 0726-0752 PT Time Calculation (min) (ACUTE ONLY): 26 min   Charges:   PT Evaluation $PT Eval Moderate Complexity: 1 Mod PT Treatments $Therapeutic Activity: 8-22 mins PT General Charges $$ ACUTE PT VISIT: 1 Visit         [name], PT Acute Rehabilitation Services Office: [phone]   Antolin Belsito B Anne [place] 07/24/2024, 10:02 AM

## 2024-07-24 NOTE — Evaluation (Signed)
 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 PMhx:ICM, LBBB, HLD, BPH, CAD, CVA with Lt weakness, CKD, T2DM, CHF, HTN     Clinical Impressions Pt admitted based on above, and was seen based on problem list below. PTA pt was receiving min to mod assistance with ADLs d/t RLE pain. Pt reporting mostly using w/c PTA. Today pt is requiring set up  to mod assist for ADLs. Pt able to complete STS with  mod assist to rise and maintain standing. Pt deferring lateral scoot transfer at this time, requesting to stay in recliner. Educated pt on compensatory strategies for LB dressing and toileting using lateral leans. Pt highly motivated to increase independence levels, and work towards a prosthetic. Pt would greatl benefit from post acute rehab in the setting of >3 hours of skilled rehab daily. OT will continue to follow acutely to maximize functional independence.        If plan is discharge home, recommend the following:   A lot of help with walking and/or transfers;A lot of help with bathing/dressing/bathroom;Assist for transportation     Functional Status Assessment   Patient has had a recent decline in their functional status and demonstrates the ability to make significant improvements in function in a reasonable and predictable amount of time.     Equipment Recommendations   BSC/3in1 (Drop arm bariatric)     Recommendations for Other Services   Rehab consult     Precautions/Restrictions   Precautions Precautions: Fall;Other (comment) Recall of Precautions/Restrictions: Impaired Precaution/Restrictions Comments: R AKA Restrictions Weight Bearing Restrictions Per Provider Order: Yes RLE Weight Bearing Per Provider Order: Non weight bearing     Mobility Bed Mobility   General bed mobility comments:  Received in bed    Transfers Overall transfer level: Needs assistance Equipment used: Rolling walker (2 wheels) Transfers: Sit to/from Stand Sit to Stand: Mod assist     General transfer comment: STS completed with mod assist, to rise, and maintain standing, cues for sequencing. Pt deferring lateral scoot transfer to bed at this time      Balance Overall balance assessment: Needs assistance Sitting-balance support: No upper extremity supported, Feet supported Sitting balance-Leahy Scale: Fair     Standing balance support: Bilateral upper extremity supported, Reliant on assistive device for balance Standing balance-Leahy Scale: Zero Standing balance comment: Reliant on RW and support of therapist       ADL either performed or assessed with clinical judgement   ADL Overall ADL's : Needs assistance/impaired Eating/Feeding: Set up;Sitting   Grooming: Set up;Sitting   Upper Body Bathing: Set up;Sitting   Lower Body Bathing: Moderate assistance;Sitting/lateral leans Lower Body Bathing Details (indicate cue type and reason): Assist for bottom in lateral leans or squat Upper Body Dressing : Set up;Sitting   Lower Body Dressing: Moderate assistance;Sitting/lateral leans;Sit to/from stand Lower Body Dressing Details (indicate cue type and reason): Able to thread LEs, don L sock. Mod assist to pull above waist with lateral leans or squat Toilet Transfer: Minimal assistance Toilet Transfer Details (indicate cue type and reason): Lateral scoot; simulated in room Toileting- Clothing Manipulation and Hygiene: Moderate assistance;Sitting/lateral lean Toileting - Clothing Manipulation Details (indicate cue type and reason): lateral leans or squat     Functional mobility during ADLs: Minimal assistance General ADL Comments: Educated pt on compensatory strategies for LB dressing at bed  level, lateral leans for toileting, positioning for lateral scoots     Vision Baseline  Vision/History: 0 No visual deficits Patient Visual Report: No change from baseline Vision Assessment?: No apparent visual deficits            Pertinent Vitals/Pain Pain Assessment Pain Assessment: No/denies pain     Extremity/Trunk Assessment Upper Extremity Assessment Upper Extremity Assessment: LUE deficits/detail LUE Deficits / Details: Hx of CVA L side affected. Deltoid and biceps 3+/5, intention tremors and dysmetria LUE Sensation: decreased light touch LUE Coordination: decreased fine motor   Lower Extremity Assessment Lower Extremity Assessment: Defer to PT evaluation LLE Deficits / Details: grossly 3/5   Cervical / Trunk Assessment Cervical / Trunk Assessment: Normal   Communication Communication Communication: No apparent difficulties   Cognition Arousal: Alert Behavior During Therapy: WFL for tasks assessed/performed Cognition: No apparent impairments       OT - Cognition Comments: Mild delayed processing speed but largely [facility]     Following commands: Intact       Cueing  General Comments   Cueing Techniques: Verbal cues  VSS on RA           Home Living Family/patient expects to be discharged to:: Private residence Living Arrangements: Alone Available Help at Discharge: Family;Available PRN/intermittently Type of Home: Apartment Home Access: Level entry     Home Layout: One level     Bathroom Shower/Tub: Chief Strategy Officer: Standard Bathroom Accessibility: Yes How Accessible: Accessible via wheelchair Home Equipment: BSC/3in1;Wheelchair - manual;Rollator (4 wheels)          Prior Functioning/Environment Prior Level of Function : Needs assist       Mobility Comments: Pt was walking with RW 1 week PTA and since then has been transferring to w/c ADLs Comments: Pt reports primarily sponge bathing,  sister completes IADLs    OT Problem List: Decreased strength;Decreased range of motion;Decreased activity  tolerance;Impaired balance (sitting and/or standing);Decreased knowledge of use of DME or AE   OT Treatment/Interventions: Self-care/ADL training;Therapeutic exercise;Energy conservation;DME and/or AE instruction;Therapeutic activities;Patient/family education;Balance training      OT Goals(Current goals can be found in the care plan section)   Acute Rehab OT Goals Patient Stated Goal: To go to [facility] OT Goal Formulation: With patient Time For Goal Achievement: 08/07/24 Potential to Achieve Goals: Good   OT Frequency:  Min 2X/week       AM-PAC OT 6 Clicks Daily Activity     Outcome Measure Help from another person eating meals?: None Help from another person taking care of personal grooming?: A Little Help from another person toileting, which includes using toliet, bedpan, or urinal?: A Lot Help from another person bathing (including washing, rinsing, drying)?: A Lot Help from another person to put on and taking off regular upper body clothing?: A Little Help from another person to put on and taking off regular lower body clothing?: A Lot 6 Click Score: 16   End of Session Equipment Utilized During Treatment: Gait belt;Rolling walker (2 wheels) Nurse Communication: Mobility status  Activity Tolerance: Patient tolerated treatment well Patient left: in chair;with call bell/phone within reach;with chair alarm set  OT Visit Diagnosis: Unsteadiness on feet (R26.81);Other abnormalities of gait and mobility (R26.89);Muscle weakness (generalized) (M62.81)                Time: 1005-1026 OT Time Calculation (min): 21 min Charges:  OT General Charges $OT Visit: 1 Visit OT Evaluation $OT Eval Moderate Complexity: 1 Mod  Kenlyn Lose  JAYSON, OT  Acute Rehabilitation Services Office [phone] Secure chat preferred   Adrianne GORMAN Savers 07/24/2024, 10:55 AM

## 2024-07-24 NOTE — Progress Notes (Signed)
 PROGRESS NOTE  Karl [name] FMW:[id number] DOB: 1956/09/15   PCP: Geofm Glade PARAS, MD  Patient is from: Home.  Uses rolling walker at baseline but recently started using wheelchair  DOA: 07/17/2024 LOS: 6  Chief complaints Chief Complaint  Patient presents with   R Lower leg pain/swelling     Brief Narrative / Interim history: 68 year old M with PMH of CVA with left-sided weakness, DM-2, systolic CHF/ICM, CAD and LBBB and presented to ED with right foot pain and difficulty ambulating for about 2 weeks, and admitted with ischemic right foot. Usually ambulates with the help of walker and last 2 weeks he has been using the wheelchair.  CT angiogram of lower extremity with poor visualization of the arteries.  Vascular surgery consulted.  Patient was started on IV heparin  and ceftriaxone .    Patient underwent aortogram and lower extremity angiogram that showed sluggish flow in right lower extremity.  Patient underwent right AKA by Dr. Serene on 8/20.  Therapy recommended CIR.  CIR following.    Subjective: Seen and examined earlier this morning.  No major events overnight or this morning.  Sitting on bedside chair.  No complaints.  Pain fairly controlled.  Has not had a bowel movement yet.  Objective: Vitals:   07/23/24 2344 07/24/24 0824 07/24/24 0939 07/24/24 1140  BP: 105/60 125/79 125/79 108/78  Pulse: 66  82 83  Resp: 16     Temp: 98.5 F (36.9 C) 98.1 F (36.7 C)  98 F (36.7 C)  TempSrc: Oral Oral  Oral  SpO2: 99%   96%  Weight:      Height:        Examination:  GENERAL: No apparent distress.  Nontoxic. HEENT: MMM.  Vision and hearing grossly intact.  NECK: Supple.  No apparent JVD.  RESP:  No IWOB.  Fair aeration bilaterally. CVS:  RRR. Heart sounds normal.  ABD/GI/GU: BS+. Abd soft, NTND.  MSK/EXT: Right AKA.  Ace wrap and dressing DCI. SKIN: As above. NEURO: AA.  Oriented appropriately.  No apparent focal neuro deficit. PSYCH: Calm. Normal affect.    Consultants:  Vascular surgery  Procedures: 8/18-aortogram and lower extremity arteriogram with sluggish flow in right lower extremity  Microbiology summarized: MRSA PCR screen nonreactive.  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  CT angio with poor visualization of arteries.  Arteriogram with sluggish flow in RLE.  CRP elevated to 16.  Underwent right AKA on 8/20.  Leukocytosis and CK improved.  -Discontinue IV ceftriaxone . -Will transition to home Eliquis  when okay by vascular -Pain control and bowel regimen -Therapy recommended CIR.  Chronic systolic CHF/ICM: TTE in 06/2022 with LVEF of 20 to 25%, GH, G1-DD.  Appears euvolemic on exam.  No respiratory distress.  Not on diuretics at home. -Continue home Coreg , Jardiance  and losartan . -Closely monitor respiratory and fluid status.  History of CAD/chronic LBBB: No cardiopulmonary symptoms. - Medication as above.  Hx of CVA w/ L sided weakness - IV heparin  drip as above   - Continue holding Crestor .  NIDDM-2 with hyperglycemia: A1c 5.9%. Recent Labs  Lab 07/23/24 1542 07/23/24 1731 07/23/24 2042 07/24/24 0821 07/24/24 1138  GLUCAP 100* 128* 267* 106* 168*  - Continue SSI-sensitive.  Hyperlipidemia -Hold Crestor  in the setting of myonecrosis.  Myonecrosis: Likely due to critical limb ischemia.  Improved - Continue holding Crestor  - Recheck CK in the morning  Increased nutrient needs Body mass  index is 19.26 kg/m. - Dietitian consulted.          DVT prophylaxis:  SCD's Start: 07/23/24 1648 On full dose anticoagulation  Code Status: Full code Family Communication: None at bedside. Level of care: Progressive Cardiac Status is: Inpatient Remains inpatient appropriate because: Chronic limb threatening ischemia   Final disposition: CIR   35 minutes with more than 50% spent in reviewing  records, counseling patient/family and coordinating care.   Sch Meds:  Scheduled Meds:  aspirin  EC  81 mg Oral Daily   carvedilol   3.125 mg Oral BID WC   docusate sodium   100 mg Oral Daily   feeding supplement  237 mL Oral BID BM   insulin  aspart  0-9 Units Subcutaneous TID WC   losartan   25 mg Oral Daily   multivitamin with minerals  1 tablet Oral Daily   nutrition supplement (JUVEN)  1 packet Oral BID BM   polyethylene glycol  17 g Oral BID   senna-docusate  1 tablet Oral BID   sodium chloride  flush  3 mL Intravenous Q12H   Continuous Infusions:  heparin  1,200 Units/hr (07/24/24 1042)   PRN Meds:.acetaminophen , hydrALAZINE , HYDROcodone -acetaminophen , HYDROcodone -acetaminophen , labetalol , metoprolol  tartrate, morphine  injection, ondansetron  (ZOFRAN ) IV, phenol, polyethylene glycol **FOLLOWED BY** [START ON 07/25/2024] polyethylene glycol, potassium chloride , senna-docusate **FOLLOWED BY** [START ON 07/25/2024] senna-docusate, sodium chloride  flush  Antimicrobials: Anti-infectives (From admission, onward)    Start     Dose/Rate Route Frequency Ordered Stop   07/22/24 1700  cefTRIAXone  (ROCEPHIN ) 2 g in sodium chloride  0.9 % 100 mL IVPB  Status:  Discontinued        2 g 200 mL/hr over 30 Minutes Intravenous Every 24 hours 07/22/24 0945 07/24/24 1052   07/20/24 1030  cefTRIAXone  (ROCEPHIN ) 2 g in sodium chloride  0.9 % 100 mL IVPB  Status:  Discontinued        2 g 200 mL/hr over 30 Minutes Intravenous Every 24 hours 07/20/24 0939 07/22/24 0945   07/18/24 1400  ceFEPIme  (MAXIPIME ) 2 g in sodium chloride  0.9 % 100 mL IVPB  Status:  Discontinued        2 g 200 mL/hr over 30 Minutes Intravenous Every 12 hours 07/18/24 1258 07/20/24 0938   07/18/24 1245  ceFEPIme  (MAXIPIME ) 2 g in sodium chloride  0.9 % 100 mL IVPB  Status:  Discontinued        2 g 200 mL/hr over 30 Minutes Intravenous Every 8 hours 07/18/24 1153 07/18/24 1258        I have personally reviewed the following labs and  images: CBC: Recent Labs  Lab 07/18/24 0817 07/19/24 0703 07/20/24 0735 07/21/24 0536 07/22/24 0437 07/23/24 0422 07/24/24 0439  WBC 17.1*   < > 12.2* 11.7* 12.7* 11.8* 9.4  NEUTROABS 14.0*  --   --   --   --   --   --   HGB 13.0   < > 11.9* 12.2* 12.5* 11.7* 11.0*  HCT 41.8   < > 37.9* 38.8* 39.9 36.5* 34.9*  MCV 79.6*   < > 78.5* 79.7* 78.9* 78.7* 79.1*  PLT 495*   < > 421* 403* 398 352 304   < > = values in this interval not displayed.   BMP &GFR Recent Labs  Lab 07/19/24 0703 07/20/24 0735 07/21/24 0536 07/22/24 0437 07/23/24 0422 07/24/24 0439  NA 134* 135 135 132* 132* 131*  K 4.3 4.0 4.5 4.6 3.8 4.5  CL 98 102 99 95* 100 97*  CO2 22 21* 26  25 23 25   GLUCOSE 101* 115* 118* 109* 129* 148*  BUN 20 16 14 14 14 13   CREATININE 1.21 1.07 1.14 1.07 1.03 0.97  CALCIUM  9.1 9.1 9.3 9.3 8.7* 9.0  MG 2.2 2.1 2.0 2.1 2.0 2.0  PHOS 3.6 2.4* 2.6 2.4* 2.4*  --    Estimated Creatinine Clearance: 69.1 mL/min (by C-G formula based on SCr of 0.97 mg/dL). Liver & Pancreas: Recent Labs  Lab 07/20/24 0735 07/21/24 0536 07/22/24 0437 07/23/24 0422 07/24/24 0439  AST 49* 64* 73* 64* 41  ALT 21 23 25 24 19   ALKPHOS 64 63 66 60 50  BILITOT 0.8 0.6 0.5 0.7 0.4  PROT 7.3 7.4 7.3 6.8 6.3*  ALBUMIN  2.2* 2.1* 2.2* 2.1* 2.0*   No results for input(s): LIPASE, AMYLASE in the last 168 hours. No results for input(s): AMMONIA in the last 168 hours. Diabetic: No results for input(s): HGBA1C in the last 72 hours. Recent Labs  Lab 07/23/24 1542 07/23/24 1731 07/23/24 2042 07/24/24 0821 07/24/24 1138  GLUCAP 100* 128* 267* 106* 168*   Cardiac Enzymes: Recent Labs  Lab 07/17/24 2047 07/19/24 0703 07/23/24 0422 07/24/24 0439  CKTOTAL 1,583* 1,218* 1,938* 696*   No results for input(s): PROBNP in the last 8760 hours. Coagulation Profile: Recent Labs  Lab 07/17/24 2047 07/21/24 0536  INR 1.6* 1.3*   Thyroid  Function Tests: No results for input(s): TSH,  T4TOTAL, FREET4, T3FREE, THYROIDAB in the last 72 hours. Lipid Profile: Recent Labs    07/22/24 0438  CHOL 149  HDL 37*  LDLCALC 97  TRIG 74  CHOLHDL 4.0   Anemia Panel: No results for input(s): VITAMINB12, FOLATE, FERRITIN, TIBC, IRON, RETICCTPCT in the last 72 hours. Urine analysis:    Component Value Date/Time   COLORURINE YELLOW 06/29/2022 1821   APPEARANCEUR CLOUDY (A) 06/29/2022 1821   LABSPEC 1.029 06/29/2022 1821   PHURINE 6.0 06/29/2022 1821   GLUCOSEU >=500 (A) 06/29/2022 1821   HGBUR MODERATE (A) 06/29/2022 1821   BILIRUBINUR NEGATIVE 06/29/2022 1821   BILIRUBINUR moderate 07/09/2014 1428   KETONESUR 20 (A) 06/29/2022 1821   PROTEINUR 30 (A) 06/29/2022 1821   UROBILINOGEN 0.2 08/07/2015 0854   NITRITE NEGATIVE 06/29/2022 1821   LEUKOCYTESUR LARGE (A) 06/29/2022 1821   Sepsis Labs: Invalid input(s): PROCALCITONIN, LACTICIDVEN  Microbiology: Recent Results (from the past 240 hours)  MRSA Next Gen by PCR, Nasal     Status: None   Collection Time: 07/18/24 11:54 AM   Specimen: Nasal Mucosa; Nasal Swab  Result Value Ref Range Status   MRSA by PCR Next Gen NOT DETECTED NOT DETECTED Final    Comment: (NOTE) The GeneXpert MRSA Assay (FDA approved for NASAL specimens only), is one component of a comprehensive MRSA colonization surveillance program. It is not intended to diagnose MRSA infection nor to guide or monitor treatment for MRSA infections. Test performance is not FDA approved in patients less than 64 years old. Performed at [facility], [address] [place], [place] [address]     Radiology Studies: No results found.     Lasandra Batley T. Kadia Abaya [facility]  If 7PM-7AM, please contact night-coverage [email or web address] 07/24/2024, 1:21 PM

## 2024-07-24 NOTE — Progress Notes (Signed)
  Progress Note    07/24/2024 8:41 AM 1 Day Post-Op  Subjective:  sitting up in chair eating breakfast, says pain in right leg is much better after amp   Vitals:   07/23/24 2344 07/24/24 0824  BP: 105/60 125/79  Pulse: 66   Resp: 16   Temp: 98.5 F (36.9 C) 98.1 F (36.7 C)  SpO2: 99%    Physical Exam: Cardiac:  regular Lungs:  non labored Incisions:  right AKA dressings c/d/I  Neurologic: alert and oriented  CBC    Component Value Date/Time   WBC 9.4 07/24/2024 0439   RBC 4.41 07/24/2024 0439   HGB 11.0 (L) 07/24/2024 0439   HCT 34.9 (L) 07/24/2024 0439   PLT 304 07/24/2024 0439   MCV 79.1 (L) 07/24/2024 0439   MCH 24.9 (L) 07/24/2024 0439   MCHC 31.5 07/24/2024 0439   RDW 15.8 (H) 07/24/2024 0439   LYMPHSABS 1.3 07/18/2024 0817   MONOABS 1.7 (H) 07/18/2024 0817   EOSABS 0.0 07/18/2024 0817   BASOSABS 0.0 07/18/2024 0817    BMET    Component Value Date/Time   NA 131 (L) 07/24/2024 0439   NA 135 12/25/2017 1143   K 4.5 07/24/2024 0439   CL 97 (L) 07/24/2024 0439   CO2 25 07/24/2024 0439   GLUCOSE 148 (H) 07/24/2024 0439   BUN 13 07/24/2024 0439   BUN 15 12/25/2017 1143   CREATININE 0.97 07/24/2024 0439   CREATININE 1.69 (H) 07/06/2020 1009   CALCIUM  9.0 07/24/2024 0439   GFRNONAA >60 07/24/2024 0439   GFRNONAA 42 (L) 07/06/2020 1009   GFRAA 48 (L) 07/06/2020 1009    INR    Component Value Date/Time   INR 1.3 (H) 07/21/2024 0536   INR 8.0 05/22/2024 1112     Intake/Output Summary (Last 24 hours) at 07/24/2024 0841 Last data filed at 07/24/2024 0825 Gross per 24 hour  Intake --  Output 1220 ml  Net -1220 ml     Assessment/Plan:  68 y.o. male is s/p right AKA 1 Day Post-Op   Pain well controlled Right AKA dressings c/d/I without any strike through Plan for dressing take down tomorrow H&H stable PT/OT to evaluate   Teretha Damme, PA-C [facility] [phone] 07/24/2024 8:41 AM

## 2024-07-24 NOTE — Progress Notes (Signed)
 PHARMACY - ANTICOAGULATION CONSULT NOTE  Pharmacy Consult for heparin  Indication: Ischemic limb and history of Afib  Allergies  Allergen Reactions   Lipitor [Atorvastatin] Nausea And Vomiting    Patient Measurements: Height: 6' 2 (188 cm) Weight: 68 kg (150 lb) IBW/kg (Calculated) : 82.2 HEPARIN  DW (KG): 68  Vital Signs: Temp: 98.3 F (36.8 C) (08/21 1534) Temp Source: Oral (08/21 1534) BP: 110/74 (08/21 1534) Pulse Rate: 83 (08/21 1140)  Labs: Recent Labs    07/21/24 2151 07/22/24 0437 07/22/24 0437 07/22/24 0438 07/23/24 0422 07/24/24 0439 07/24/24 1615  HGB  --  12.5*   < >  --  11.7* 11.0*  --   HCT  --  39.9  --   --  36.5* 34.9*  --   PLT  --  398  --   --  352 304  --   APTT 73* 67*  --   --   --   --   --   HEPARINUNFRC  --   --   --  0.40 0.45  --  0.22*  CREATININE  --  1.07  --   --  1.03 0.97  --   CKTOTAL  --   --   --   --  1,938* 696*  --    < > = values in this interval not displayed.    Estimated Creatinine Clearance: 69.1 mL/min (by C-G formula based on SCr of 0.97 mg/dL).   Assessment: 81 yoM presented to ED with right foot pain. Pharmacy consulted to dose heparin  for ischemic limb and history of Afib. PTA Eliquis  on hold, last dose on 8/14 AM.   8/21: S/p AKA on 8/20, Pharmacy asked to resume 8/21 at 0800 AM. Drip not started until ~ 10:45 this AM. Heparin  level is subtherapeutic at 0.22, but only ~5hr after drip started. No bleeding reported per RN.  Goal of Therapy:  Heparin  level 0.3-0.7 units/ml aPTT 66-102 seconds Monitor platelets by anticoagulation protocol: Yes   Plan:  -Increase heparin  slightly to 1250 units/hr  -Check heparin  level 8 hrs after  -Daily heparin  level and CBC -d/c daily aPTT  Rocky Slade, PharmD, BCPS Clinical Pharmacist  07/24/2024 4:50 PM   [facility] pharmacy phone numbers are listed on [email or web address]

## 2024-07-24 NOTE — Consult Note (Addendum)
 Physical Medicine and Rehabilitation Consult Reason for Consult: Evaluate appropriateness for Inpatient Rehab Referring Physician: Dr. Kathrin    HPI: Karl [name] is a 68 y.o. 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 . They were admitted to [facility] on 07/17/2024 for right foot pain and for 2 weeks and difficulty walking.  In the ER, patient had CT angiogram with poor artery visualization, Dr. Serene with vascular surgery was consulted and concerned for ischemic right foot.  He was started on a heparin  drip and after discussion with Dr. Serene elected for right AKA.  Procedure was performed on 8-20, with no intraoperative complications.  Hospital stay has been complicated by hyponatremia, hyperglycemia, elevated cardiac enzymes, leukocytosis now improving, pain, hypotension, and constipation.  PM&R was consulted to evaluate appropriateness for IPR admission.   Per chart review, patient lives in a private apartment with level entry and wheelchair accessibility.  He has help intermittently available from family members at baseline, however his sister and niece are arranging for 24-7 support at discharge.  At baseline, he walks with a rolling walker, and is reliant on his sister for IADLs.  He sponge bathes at home, but otherwise is independent of ADLs.  Currently, he is modified independent for bed mobility, max assist for sit to stand and min assist for lateral scoot transfers.  He is set up for upper body ADLs and moderate assist for lower body ADLs, moderate assist for toileting.  Review of Systems  Constitutional:  Negative for chills and fever.  Cardiovascular:  Negative for chest pain, palpitations and leg  swelling.  Gastrointestinal:  Positive for constipation. Negative for abdominal pain, nausea and vomiting.  Genitourinary:  Negative for dysuria and urgency.  Neurological:  Positive for tremors and focal weakness. Negative for dizziness and sensory change.  Psychiatric/Behavioral:  The patient does not have insomnia.    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 Waddell 15 years 1998   Dyspnea    Hypertension    Stroke [facility]) 2019   denies residual on 06/14/2018   TIA (transient ischemic attack) 12/19/2017   Type II diabetes mellitus (HCC) 07/2014 dx   Past Surgical History:  Procedure Laterality Date   ABDOMINAL AORTOGRAM N/A 07/21/2024   Procedure: ABDOMINAL AORTOGRAM;  Surgeon: Pearline Norman RAMAN, MD;  Location: [facility] INVASIVE CV LAB;  Service: Cardiovascular;  Laterality: N/A;   AMPUTATION Right 07/23/2024   Procedure: AMPUTATION, ABOVE KNEE;  Surgeon: Serene Gaile ORN, MD;  Location: [facility] OR;  Service: Vascular;  Laterality: Right;   CORONARY ANGIOPLASTY WITH STENT PLACEMENT  1998   FRACTURE SURGERY     GREEN LIGHT LASER TURP (TRANSURETHRAL RESECTION OF PROSTATE N/A 06/05/2018   Procedure: GREEN LIGHT LASER TURP , TRANSURETHRAL RESECTION OF PROSTATE;  Surgeon: Carolee Sherwood JONETTA DOUGLAS, MD;  Location: [facility] ORS;  Service: Urology;  Laterality: N/A;   LOWER EXTREMITY ANGIOGRAPHY N/A 07/21/2024   Procedure: Lower Extremity Angiography;  Surgeon: Pearline Norman RAMAN, MD;  Location: [facility] INVASIVE CV LAB;  Service: Cardiovascular;  Laterality: N/A;   TIBIA FRACTURE SURGERY Left 1980s   Family History  Problem Relation Age of Onset   Diabetes Mother    Diabetes Father    Stroke Father 42   Diabetes Sister    Clotting disorder Sister 80   Diabetes Other        nephew   Diabetes Brother    Diabetes Maternal Grandmother    Colon cancer Neg Hx    Esophageal cancer Neg  Hx    Pancreatic cancer Neg Hx    Stomach cancer Neg Hx    Liver disease Neg Hx    Social History:  reports that he quit smoking about 23 years ago. His smoking use included cigarettes. He started smoking about 28 years ago. He has a 10 pack-year smoking history. He has never used smokeless tobacco. He reports that he does not currently use alcohol. He reports that he does not currently use drugs. Allergies:  Allergies  Allergen Reactions   Lipitor [Atorvastatin] Nausea And Vomiting   Medications Prior to Admission  Medication Sig Dispense Refill   acetaminophen  (TYLENOL ) 325 MG tablet Take 1-2 tablets (325-650 mg total) by mouth every 4 (four) hours as needed for mild pain. (Patient taking differently: Take 325 mg by mouth daily as needed for mild pain (pain score 1-3) or moderate pain (pain score 4-6).)     apixaban  (ELIQUIS ) 5 MG TABS tablet Take 1 tablet (5 mg total) by mouth 2 (two) times daily. Needs Cardiology appt for refills.  Call office [phone].  Thank you..Take 1 tablet (5 mg total) by mouth 2 (two) times daily. (Patient taking differently: Take 5 mg by mouth 2 (two) times daily.) 60 tablet 0   carvedilol  (COREG ) 3.125 MG tablet TAKE 1 TABLET BY MOUTH TWICE DAILY WITH A MEAL 180 tablet 0   empagliflozin  (JARDIANCE ) 10 MG TABS tablet Take 1 tablet (10 mg total) by mouth daily before breakfast. 90 tablet 1   losartan  (COZAAR ) 25 MG tablet TAKE 1 TABLET BY MOUTH ONCE DAILY **FOLLOW  UP  APPOINTMENT  DUE  IN  OCTOBER  MUST  SEE  PROVIDER  FOR  FUTURE  REFILLS** (Patient taking differently: Take 25 mg by mouth daily.) 90 tablet 0   metFORMIN  (GLUCOPHAGE ) 1000 MG tablet Take 1 tablet (1,000 mg total) by mouth 2 (two) times daily with a meal. 180 tablet 0   potassium chloride  (KLOR-CON ) 10 MEQ tablet Take 10 mEq by mouth 2 (two) times daily.     rosuvastatin  (CRESTOR ) 20 MG tablet Take 1 tablet (20 mg total) by mouth daily. 90 tablet 1   Lancets 30G MISC Use to check blood sugars daily  100 each 3    Home: Home Living Family/patient expects to be discharged to:: Private residence Living Arrangements: Alone Available Help at Discharge: Family, Available PRN/intermittently Type of Home: Apartment Home Access: Level entry Home Layout: One level Bathroom Shower/Tub: Engineer, manufacturing systems: Standard Bathroom Accessibility: Yes Home Equipment: BSC/3in1, Wheelchair - manual, Rollator (4 wheels)  Functional History: Prior Function Prior Level of Function : Needs assist Mobility Comments: Pt was walking with RW 1 week PTA and since then has been transferring to w/c ADLs Comments: Pt reports primarily sponge bathing,  sister completes IADLs Functional Status:  Mobility: Bed Mobility Overal bed mobility: Modified Independent General bed mobility comments: Received in bed Transfers Overall transfer level: Needs assistance Equipment used: Rolling walker (2 wheels) Transfers: Sit to/from Stand Sit to Stand: Mod assist Bed to/from chair/wheelchair/BSC transfer type:: Lateral/scoot transfer  Lateral/Scoot Transfers: Min assist General transfer comment: STS completed with  mod assist, to rise, and maintain standing, cues for sequencing. Pt deferring lateral scoot transfer to bed at this time Ambulation/Gait General Gait Details: unable    ADL: ADL Overall ADL's : Needs assistance/impaired Eating/Feeding: Set up, Sitting Grooming: Set up, Sitting Upper Body Bathing: Set up, Sitting Lower Body Bathing: Moderate assistance, Sitting/lateral leans Lower Body Bathing Details (indicate cue type and reason): Assist for bottom in lateral leans or squat Upper Body Dressing : Set up, Sitting Lower Body Dressing: Moderate assistance, Sitting/lateral leans, Sit to/from stand Lower Body Dressing Details (indicate cue type and reason): Able to thread LEs, don L sock. Mod assist to pull above waist with lateral leans or squat Toilet Transfer: Minimal assistance Toilet  Transfer Details (indicate cue type and reason): Lateral scoot; simulated in room Toileting- Clothing Manipulation and Hygiene: Moderate assistance, Sitting/lateral lean Toileting - Clothing Manipulation Details (indicate cue type and reason): lateral leans or squat Functional mobility during ADLs: Minimal assistance General ADL Comments: Educated pt on compensatory strategies for LB dressing at bed level, lateral leans for toileting, positioning for lateral scoots  Cognition: Cognition Orientation Level: Oriented X4 Cognition Arousal: Alert Behavior During Therapy: WFL for tasks assessed/performed  Blood pressure 108/78, pulse 83, temperature 98 F (36.7 C), temperature source Oral, resp. rate 16, height 6' 2 (1.88 m), weight 68 kg, SpO2 96%. Physical Exam Constitutional: No apparent distress. Appropriate appearance for age.  Sitting upright in bedside chair HENT: No JVD. Neck Supple. Trachea midline. Atraumatic, normocephalic.  Poor dentition. Eyes: PERRLA. EOMI. Visual fields grossly intact.  Cardiovascular: RRR, no murmurs/rub/gallops. No Edema.  Left dorsalis pedis pulse marked, not palpable.  Brisk capillary refill. Respiratory: CTAB. No rales, rhonchi, or wheezing. On RA.  Abdomen: + bowel sounds, normoactive. No distention or tenderness.  Skin: C/D/I. No apparent lesions.  Dry, flaking skin on bottom of left foot.  No apparent wounds.  AKA site wrapped in gauze and Ace wrap; clean.   MSK:      + Right AKA; full active range of motion in hip flexion and extension       Neurologic exam:  Cognition: AAO to person, place, time and event.  Language: Fluent, No substitutions or neoglisms. No dysarthria. Names 3/3 objects correctly.  Memory: Recalls 3/3 objects at 5 minutes. No apparent deficits  Insight: Good  insight into current condition.  Mood: Pleasant affect, appropriate mood.  Sensation: To light touch intact in BL UEs and LEs  Reflexes: 2+ in BL UE and LEs. Negative  Hoffman's and babinski signs bilaterally.  CN: 2-12 grossly intact.  Coordination: Left upper and lower extremity ataxia, left upper extremity tremor at rest.  Some extensor synergy in left upper extremity, especially in digits 4-5.  Spasticity: MAS 0 in all extremities--limited assessment in LLE due to resisting PROM      Strength:                RUE: 5/5 SA, 5/5 EF, 5/5 EE, 5/5 WE, 5/5 FF, 5/5 FA                LUE:  4/5 SA, 4/5 EF, 4/5 EE, 4/5 WE, 4/5 FF, 4/5 FA                RLE: 5/5 HF                LLE:  4/5 HF, 5-/5 KE, 5-/5  DF, 5-/5  EHL, 5-/5  PF     Results for orders placed or performed  during the hospital encounter of 07/17/24 (from the past 24 hours)  Glucose, capillary     Status: Abnormal   Collection Time: 07/23/24  3:42 PM  Result Value Ref Range   Glucose-Capillary 100 (H) 70 - 99 mg/dL  Glucose, capillary     Status: Abnormal   Collection Time: 07/23/24  5:31 PM  Result Value Ref Range   Glucose-Capillary 128 (H) 70 - 99 mg/dL  Glucose, capillary     Status: Abnormal   Collection Time: 07/23/24  8:42 PM  Result Value Ref Range   Glucose-Capillary 267 (H) 70 - 99 mg/dL  CBC     Status: Abnormal   Collection Time: 07/24/24  4:39 AM  Result Value Ref Range   WBC 9.4 4.0 - 10.5 K/uL   RBC 4.41 4.22 - 5.81 MIL/uL   Hemoglobin 11.0 (L) 13.0 - 17.0 g/dL   HCT 65.0 (L) 60.9 - 47.9 %   MCV 79.1 (L) 80.0 - 100.0 fL   MCH 24.9 (L) 26.0 - 34.0 pg   MCHC 31.5 30.0 - 36.0 g/dL   RDW 84.1 (H) 88.4 - 84.4 %   Platelets 304 150 - 400 K/uL   nRBC 0.0 0.0 - 0.2 %  Comprehensive metabolic panel with GFR     Status: Abnormal   Collection Time: 07/24/24  4:39 AM  Result Value Ref Range   Sodium 131 (L) 135 - 145 mmol/L   Potassium 4.5 3.5 - 5.1 mmol/L   Chloride 97 (L) 98 - 111 mmol/L   CO2 25 22 - 32 mmol/L   Glucose, Bld 148 (H) 70 - 99 mg/dL   BUN 13 8 - 23 mg/dL   Creatinine, Ser 9.02 0.61 - 1.24 mg/dL   Calcium  9.0 8.9 - 10.3 mg/dL   Total Protein 6.3 (L) 6.5 -  8.1 g/dL   Albumin  2.0 (L) 3.5 - 5.0 g/dL   AST 41 15 - 41 U/L   ALT 19 0 - 44 U/L   Alkaline Phosphatase 50 38 - 126 U/L   Total Bilirubin 0.4 0.0 - 1.2 mg/dL   GFR, Estimated >39 >39 mL/min   Anion gap 9 5 - 15  CK     Status: Abnormal   Collection Time: 07/24/24  4:39 AM  Result Value Ref Range   Total CK 696 (H) 49 - 397 U/L  Magnesium      Status: None   Collection Time: 07/24/24  4:39 AM  Result Value Ref Range   Magnesium  2.0 1.7 - 2.4 mg/dL  Glucose, capillary     Status: Abnormal   Collection Time: 07/24/24  8:21 AM  Result Value Ref Range   Glucose-Capillary 106 (H) 70 - 99 mg/dL  Glucose, capillary     Status: Abnormal   Collection Time: 07/24/24 11:38 AM  Result Value Ref Range   Glucose-Capillary 168 (H) 70 - 99 mg/dL   No results found.  Assessment/Plan: Diagnosis: Right AKA due to peripheral artery disease Does the need for close, 24 hr/day medical supervision in concert with the patient's rehab needs make it unreasonable for this patient to be served in a less intensive setting? Yes Co-Morbidities requiring supervision/potential complications: History of stroke with left sided ataxia/motor deficits, hyponatremia, hyperglycemia, spasticity, elevated cardiac enzymes, leukocytosis now improving, pain, hypotension, and constipation Due to bowel management, safety, skin/wound care, disease management, medication administration, pain management, and patient education, does the patient require 24 hr/day rehab nursing?  Does the patient require coordinated care of a physician, rehab  nurse, therapy disciplines of PT, OT to address physical and functional deficits in the context of the above medical diagnosis(es)? Yes Addressing deficits in the following areas: balance, endurance, locomotion, strength, transferring, bathing, dressing, grooming, and toileting Can the patient actively participate in an intensive therapy program of at least 3 hrs of therapy per day at least 5 days  per week? Yes The potential for patient to make measurable gains while on inpatient rehab is good Anticipated functional outcomes upon discharge from inpatient rehab are supervision  with PT, supervision with OT Estimated rehab length of stay to reach the above functional goals is: 7-10 days Anticipated discharge destination: Home Overall Rehab/Functional Prognosis: good  POST ACUTE RECOMMENDATIONS: This patient's condition is appropriate for continued rehabilitative care in the following setting: CIR Patient has agreed to participate in recommended program. Yes Note that insurance prior authorization may be required for reimbursement for recommended care.   I have personally performed a face to face diagnostic evaluation of this patient. Additionally, I have examined the patient's medical record including any pertinent labs and radiographic images. If the physician assistant has documented in this note, I have reviewed and edited or otherwise concur with the physician assistant's documentation.  Thanks,  Joesph JAYSON Likes, DO 07/24/2024

## 2024-07-24 NOTE — Progress Notes (Signed)
 Patient family member needs a note from case management to prove that she was here 08/20. Informed her that case management would be here tomorrow morning and to contact the floor with either a fax number or email for case management to send it to.

## 2024-07-24 NOTE — PMR Pre-admission (Shared)
 PMR Admission Coordinator Pre-Admission Assessment  Patient: Karl [name] is an 68 y.o., male MRN: [id number] DOB: [birth year] Height: 6' 2 (188 cm) Weight: 68 kg              Insurance Information HMO: yes    PPO:      PCP:      IPA:      80/20:      OTHER:  PRIMARY: [employer]      Policy#: [id number]      Subscriber: patient CM Name: ***      Phone#: ***     Fax#: [phone] Pre-Cert#: [id number]      Employer:  Benefits:  Phone #: n/a-online at [email or web address]     Name:  Eff. Date: 04/03/24-still active     Deduct: does not have one      Out of Pocket Max: $9,350 ($0 met)      Life Max: NA  CIR: $399/day co-pay with a max co-pay of $2,394/admission      SNF: 100% coverage for days 1-20, $214/day co-pay for days 21-100 Outpatient: $25 co-pay/visit     Co-Pay:  Home Health: 100% coverage      Co-Pay:  DME: 80% coverage     Co-Pay: 20% co-insurance Providers: in-network SECONDARY:       Policy#:       Phone#:   Artist:       Phone#:   The Data processing manager" for patients in Inpatient Rehabilitation Facilities with attached "Privacy Act Statement-Health Care Records" was provided and verbally reviewed with: Patient  Emergency Contact Information Contact Information     Name Relation Home Work Mobile   Lowe,Diana Daughter [phone]   [facility] Daughter   [phone]   Cole Rolin [place]   [phone]      Other Contacts     Name Relation Home Work Mobile   Haskett,Telisa Sister   [phone]      Current Medical History  Patient Admitting Diagnosis: Right AKA History of Present Illness: Pt is a 68 year old male with medical hx significant for: CAD, 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 Pt presented to [facility] on 07/17[facility]24 d/t pain in right foot and leg x 1 week. Also reports foot feels cold. Vascular surgery  consulted. Noted pt with large right great toenail with fungal infection. Poor visualization of arteries noted on  CT scan of lower extremities. Pt started on IV heparin  and ceftriaxone .Pt underwent lower extremity angiogram and aortogram by Dr. Pearline on 07/21/24. Found to have no revascularization options for right lower extremity. 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 Serene. Therapy evaluations completed and CIR recommended d/t pt's deficits in functional mobility.    Glasgow Coma Scale Score: 15  Patient's medical record from [facility] has been reviewed by the rehabilitation admission coordinator and physician.  Past Medical History  Past Medical History:  Diagnosis Date   Acute CVA (cerebrovascular accident) (HCC) 06/14/2018   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 Waddell 15 years 1998   Dyspnea    Hypertension    Stroke [facility]) 2019   denies residual on 06/14/2018   TIA (transient ischemic attack) 12/19/2017   Type II diabetes mellitus (HCC) 07/2014 dx    Has the patient had major surgery during 100 days prior to admission? Yes  Family History  family history includes Clotting disorder (age of onset: 75) in his sister; Diabetes in his brother, father, maternal grandmother, mother, sister, and another family member; Stroke (age of onset: 57) in his father.   Current Medications   Current Facility-Administered Medications:    acetaminophen  (TYLENOL ) tablet 650 mg, 650 mg, Oral, Q4H PRN, Baglia, Corrina, PA-C   aspirin  EC tablet 81 mg, 81 mg, Oral, Daily, Baglia, Corrina, PA-C, 81 mg at 07/24/24 9060   carvedilol  (COREG ) tablet 3.125 mg, 3.125 mg, Oral, BID WC, Baglia, Corrina, PA-C, 3.125 mg at 07/24/24 9060   docusate sodium  (COLACE) capsule 100 mg, 100 mg, Oral, Daily, Baglia, Corrina, PA-C, 100 mg at 07/24/24 9060   feeding supplement (ENSURE PLUS HIGH PROTEIN) liquid 237 mL, 237 mL, Oral, BID BM, Gonfa, Taye T, MD,  237 mL at 07/24/24 1239   heparin  ADULT infusion 100 units/mL (25000 units/250mL), 1,200 Units/hr, Intravenous, Continuous, Baglia, Corrina, PA-C, Last Rate: 12 mL/hr at 07/24/24 1042, 1,200 Units/hr at 07/24/24 1042   hydrALAZINE  (APRESOLINE ) injection 5 mg, 5 mg, Intravenous, Q20 Min PRN, Baglia, Corrina, PA-C   HYDROcodone -acetaminophen  (NORCO) 7.5-325 MG per tablet 1-2 tablet, 1-2 tablet, Oral, Q4H PRN, Baglia, Corrina, PA-C   HYDROcodone -acetaminophen  (NORCO/VICODIN) 5-325 MG per tablet 1-2 tablet, 1-2 tablet, Oral, Q4H PRN, Baglia, Corrina, PA-C   insulin  aspart (novoLOG ) injection 0-9 Units, 0-9 Units, Subcutaneous, TID WC, Baglia, Corrina, PA-C, 2 Units at 07/24/24 1238   labetalol  (NORMODYNE ) injection 10 mg, 10 mg, Intravenous, Q10 min PRN, Baglia, Corrina, PA-C   losartan  (COZAAR ) tablet 25 mg, 25 mg, Oral, Daily, Baglia, Corrina, PA-C, 25 mg at 07/24/24 9060   metoprolol  tartrate (LOPRESSOR ) injection 2-5 mg, 2-5 mg, Intravenous, Q2H PRN, Baglia, Corrina, PA-C   morphine  (PF) 2 MG/ML injection 0.5-1 mg, 0.5-1 mg, Intravenous, Q2H PRN, Baglia, Corrina, PA-C   multivitamin with minerals tablet 1 tablet, 1 tablet, Oral, Daily, Gonfa, Taye T, MD, 1 tablet at 07/24/24 1237   nutrition supplement (JUVEN) (JUVEN) powder packet 1 packet, 1 packet, Oral, BID BM, Gonfa, Taye T, MD, 1 packet at 07/24/24 1053   ondansetron  (ZOFRAN ) injection 4 mg, 4 mg, Intravenous, Q6H PRN, Baglia, Corrina, PA-C   phenol (CHLORASEPTIC) mouth spray 1 spray, 1 spray, Mouth/Throat, PRN, Baglia, Corrina, PA-C   polyethylene glycol (MIRALAX  / GLYCOLAX ) packet 17 g, 17 g, Oral, BID, 17 g at 07/24/24 1053 **FOLLOWED BY** [START ON 07/25/2024] polyethylene glycol (MIRALAX  / GLYCOLAX ) packet 17 g, 17 g, Oral, BID PRN, Gonfa, Taye T, MD   potassium chloride  SA (KLOR-CON  M) CR tablet 40-60 mEq, 40-60 mEq, Oral, Daily PRN, Baglia, Corrina, PA-C   senna-docusate (Senokot-S) tablet 1 tablet, 1 tablet, Oral, BID, 1 tablet at  07/24/24 1053 **FOLLOWED BY** [START ON 07/25/2024] senna-docusate (Senokot-S) tablet 2 tablet, 2 tablet, Oral, BID PRN, Gonfa, Taye T, MD   sodium chloride  flush (NS) 0.9 % injection 3 mL, 3 mL, Intravenous, Q12H, Baglia, Corrina, PA-C, 3 mL at 07/24/24 0941   sodium chloride  flush (NS) 0.9 % injection 3 mL, 3 mL, Intravenous, PRN, Baglia, Corrina, PA-C  Patients Current Diet:  Diet Order             Diet regular Room service appropriate? Yes; Fluid consistency: Thin  Diet effective now                   Precautions / Restrictions Precautions Precautions: Fall, Other (comment) Precaution/Restrictions Comments: R AKA Restrictions Weight Bearing Restrictions Per Provider Order: Yes RLE Weight Bearing Per Provider Order:  Non weight bearing   Has the patient had 2 or more falls or a fall with injury in the past year?No  Prior Activity Level Limited Community (1-2x/wk): MD appointments  Prior Functional Level Prior Function Prior Level of Function : Needs assist Mobility Comments: Pt was walking with RW 1 week PTA and since then has been transferring to w/c ADLs Comments: Pt reports primarily sponge bathing,  sister completes IADLs  Self Care: Did the patient need help bathing, dressing, using the toilet or eating?  Independent  Indoor Mobility: Did the patient need assistance with walking from room to room (with or without device)? Independent  Stairs: Did the patient need assistance with internal or external stairs (with or without device)? Unknown (pt avoids stairs)  Functional Cognition: Did the patient need help planning regular tasks such as shopping or remembering to take medications? Independent  Patient Information Are you of Hispanic, Latino/a,or Spanish origin?: A. No, not of Hispanic, Latino/a, or Spanish origin What is your race?: B. Black or African American Do you need or want an interpreter to communicate with a doctor or health care staff?: 0.  No  Patient's Response To:  Health Literacy and Transportation Is the patient able to respond to health literacy and transportation needs?: Yes Health Literacy - How often do you need to have someone help you when you read instructions, pamphlets, or other written material from your doctor or pharmacy?: Never In the past 12 months, has lack of transportation kept you from medical appointments or from getting medications?: No In the past 12 months, has lack of transportation kept you from meetings, work, or from getting things needed for daily living?: No  Journalist, newspaper / Equipment Home Equipment: BSC/3in1, Wheelchair - manual, Rollator (4 wheels)  Prior Device Use: Indicate devices/aids used by the patient prior to current illness, exacerbation or injury? Manual wheelchair and Walker  Current Functional Level Cognition  Orientation Level: Oriented X4    Extremity Assessment (includes Sensation/Coordination)  Upper Extremity Assessment: LUE deficits/detail LUE Deficits / Details: Hx of CVA L side affected. Deltoid and biceps 3+/5, intention tremors and dysmetria LUE Sensation: decreased light touch LUE Coordination: decreased fine motor  Lower Extremity Assessment: Defer to PT evaluation LLE Deficits / Details: grossly 3/5    ADLs  Overall ADL's : Needs assistance/impaired Eating/Feeding: Set up, Sitting Grooming: Set up, Sitting Upper Body Bathing: Set up, Sitting Lower Body Bathing: Moderate assistance, Sitting/lateral leans Lower Body Bathing Details (indicate cue type and reason): Assist for bottom in lateral leans or squat Upper Body Dressing : Set up, Sitting Lower Body Dressing: Moderate assistance, Sitting/lateral leans, Sit to/from stand Lower Body Dressing Details (indicate cue type and reason): Able to thread LEs, don L sock. Mod assist to pull above waist with lateral leans or squat Toilet Transfer: Minimal assistance Toilet Transfer Details (indicate cue  type and reason): Lateral scoot; simulated in room Toileting- Clothing Manipulation and Hygiene: Moderate assistance, Sitting/lateral lean Toileting - Clothing Manipulation Details (indicate cue type and reason): lateral leans or squat Functional mobility during ADLs: Minimal assistance General ADL Comments: Educated pt on compensatory strategies for LB dressing at bed level, lateral leans for toileting, positioning for lateral scoots    Mobility  Overal bed mobility: Modified Independent General bed mobility comments: Received in bed    Transfers  Overall transfer level: Needs assistance Equipment used: Rolling walker (2 wheels) Transfers: Sit to/from Stand Sit to Stand: Mod assist Bed to/from chair/wheelchair/BSC transfer type:: Lateral/scoot transfer  Lateral/Scoot Transfers: Min assist General transfer comment: STS completed with mod assist, to rise, and maintain standing, cues for sequencing. Pt deferring lateral scoot transfer to bed at this time    Ambulation / Gait / Stairs / Wheelchair Mobility  Ambulation/Gait General Gait Details: unable    Posture / Balance Balance Overall balance assessment: Needs assistance Sitting-balance support: No upper extremity supported, Feet supported Sitting balance-Leahy Scale: Fair Standing balance support: Bilateral upper extremity supported, Reliant on assistive device for balance Standing balance-Leahy Scale: Zero Standing balance comment: Reliant on RW and support of therapist    Special considerations/ Life events Continuous Drip IV  ***, Skin Surgical Incision: leg/right, and Diabetic management Novolog  0-9 units 3x daily with meals     Previous Home Environment (from acute therapy documentation) Living Arrangements: Alone Available Help at Discharge: Family, Available 24 hours/day Type of Home: Apartment Home Layout: One level Home Access: Level entry Bathroom Shower/Tub: Engineer, manufacturing systems: Standard Bathroom  Accessibility: Yes How Accessible: Accessible via walker, Accessible via wheelchair Home Care Services: No  Discharge Living Setting Plans for Discharge Living Setting: Patient's home Type of Home at Discharge: Apartment Discharge Home Layout: One level Discharge Home Access: Level entry Discharge Bathroom Shower/Tub: Tub/shower unit Discharge Bathroom Toilet: Standard Discharge Bathroom Accessibility: Yes How Accessible: Accessible via walker, Accessible via wheelchair Does the patient have any problems obtaining your medications?: No (has conerns about the amount of time it takes to get his medication)  Social/Family/Support Systems Anticipated Caregiver: Nai Dasch (sister) and other family Anticipated Caregiver's Contact Information: [phone] Caregiver Availability: 24/7 Discharge Plan Discussed with Primary Caregiver: Yes Is Caregiver In Agreement with Plan?: Yes Does Caregiver/Family have Issues with Lodging/Transportation while Pt is in Rehab?: No   Goals Patient/Family Goal for Rehab: *** Expected length of stay: *** Pt/Family Agrees to Admission and willing to participate: Yes Program Orientation Provided & Reviewed with Pt/Caregiver Including Roles  & Responsibilities: Yes   Decrease burden of Care through IP rehab admission: NA   Possible need for SNF placement upon discharge:Not anticipated   Patient Condition: {PATIENT'S CONDITION:22832}  Preadmission Screen Completed By:  Tinnie SHAUNNA Yvone Delayne, CCC-SLP, 07/24/2024 12:59 PM ______________________________________________________________________   Discussed status with Dr. PIERRETTEon***at *** and received approval for admission today.  Admission Coordinator:  Tinnie SHAUNNA Yvone Delayne, time***/Date***

## 2024-07-24 NOTE — Progress Notes (Signed)
 Inpatient Rehab Admissions:  Inpatient Rehab Consult received.  I met with patient, sister and niece at the bedside for rehabilitation assessment and to discuss goals and expectations of an inpatient rehab admission.  Discussed average length of stay, insurance authorization requirement and discharge home after completion of CIR. Pt and family acknowledged understanding. Pt interested in pursuing CIR. Family is supportive. Sister and niece confirmed that pt will have 24/7 support after discharge. Also spoke with pt's sister Dede on the phone. She also acknowledged understanding of CIR goals and expectations. She is supportive of pt pursuing CIR. She also confirmed pt will have 24/7 support after discharge. Will continue to follow.  Signed: Tinnie Yvone Cohens, MS, CCC-SLP Admissions Coordinator [phone]

## 2024-07-25 DIAGNOSIS — I10 Essential (primary) hypertension: Secondary | ICD-10-CM | POA: Diagnosis not present

## 2024-07-25 DIAGNOSIS — E1159 Type 2 diabetes mellitus with other circulatory complications: Secondary | ICD-10-CM | POA: Diagnosis not present

## 2024-07-25 DIAGNOSIS — E43 Unspecified severe protein-calorie malnutrition: Secondary | ICD-10-CM | POA: Insufficient documentation

## 2024-07-25 DIAGNOSIS — I998 Other disorder of circulatory system: Secondary | ICD-10-CM | POA: Diagnosis not present

## 2024-07-25 DIAGNOSIS — Z8673 Personal history of transient ischemic attack (TIA), and cerebral infarction without residual deficits: Secondary | ICD-10-CM | POA: Diagnosis not present

## 2024-07-25 LAB — RENAL FUNCTION PANEL
Albumin: 2.2 g/dL — ABNORMAL LOW (ref 3.5–5.0)
Anion gap: 7 (ref 5–15)
BUN: 26 mg/dL — ABNORMAL HIGH (ref 8–23)
CO2: 27 mmol/L (ref 22–32)
Calcium: 9 mg/dL (ref 8.9–10.3)
Chloride: 98 mmol/L (ref 98–111)
Creatinine, Ser: 1.17 mg/dL (ref 0.61–1.24)
GFR, Estimated: >60 mL/min (ref >60)
Glucose, Bld: 176 mg/dL — ABNORMAL HIGH (ref 70–99)
Phosphorus: 1.5 mg/dL — ABNORMAL LOW (ref 2.5–4.6)
Potassium: 4.5 mmol/L (ref 3.5–5.1)
Sodium: 132 mmol/L — ABNORMAL LOW (ref 135–145)

## 2024-07-25 LAB — CBC
HCT: 41.1 % (ref 39.0–52.0)
Hemoglobin: 12.8 g/dL — ABNORMAL LOW (ref 13.0–17.0)
MCH: 24.7 pg — ABNORMAL LOW (ref 26.0–34.0)
MCHC: 31.1 g/dL (ref 30.0–36.0)
MCV: 79.2 fL — ABNORMAL LOW (ref 80.0–100.0)
Platelets: 421 K/uL — ABNORMAL HIGH (ref 150–400)
RBC: 5.19 MIL/uL (ref 4.22–5.81)
RDW: 15.7 % — ABNORMAL HIGH (ref 11.5–15.5)
WBC: 13.8 K/uL — ABNORMAL HIGH (ref 4.0–10.5)
nRBC: 0 % (ref 0.0–0.2)

## 2024-07-25 LAB — CK: Total CK: 1150 U/L — ABNORMAL HIGH (ref 49–397)

## 2024-07-25 LAB — SURGICAL PATHOLOGY

## 2024-07-25 LAB — GLUCOSE, CAPILLARY
Glucose-Capillary: 137 mg/dL — ABNORMAL HIGH (ref 70–99)
Glucose-Capillary: 240 mg/dL — ABNORMAL HIGH (ref 70–99)
Glucose-Capillary: 133 mg/dL — ABNORMAL HIGH (ref 70–99)
Glucose-Capillary: 166 mg/dL — ABNORMAL HIGH (ref 70–99)

## 2024-07-25 LAB — HEPARIN LEVEL (UNFRACTIONATED)
Heparin Unfractionated: 0.27 [IU]/mL — ABNORMAL LOW (ref 0.30–0.70)
Heparin Unfractionated: 0.16 [IU]/mL — ABNORMAL LOW (ref 0.30–0.70)

## 2024-07-25 LAB — MAGNESIUM: Magnesium: 2.2 mg/dL (ref 1.7–2.4)

## 2024-07-25 MED ORDER — SODIUM CHLORIDE 0.9 % IV SOLN: INTRAVENOUS | Status: AC

## 2024-07-25 MED ORDER — SODIUM CHLORIDE 0.9 % IV BOLUS
500.0000 mL | Freq: Once | INTRAVENOUS | Status: AC
  Administered 2024-07-25: 500 mL via INTRAVENOUS

## 2024-07-25 MED ORDER — METOPROLOL TARTRATE 25 MG PO TABS
25.0000 mg | ORAL_TABLET | Freq: Two times a day (BID) | ORAL | Status: DC
  Administered 2024-07-25: 25 mg via ORAL
  Filled 2024-07-25: qty 1

## 2024-07-25 MED ORDER — HEPARIN BOLUS VIA INFUSION
1000.0000 [IU] | Freq: Once | INTRAVENOUS | Status: AC
  Administered 2024-07-25: 1000 [IU] via INTRAVENOUS
  Filled 2024-07-25: qty 1000

## 2024-07-25 MED ORDER — APIXABAN 5 MG PO TABS
5.0000 mg | ORAL_TABLET | Freq: Two times a day (BID) | ORAL | Status: DC
  Administered 2024-07-25 – 2024-07-31 (×13): 5 mg via ORAL
  Filled 2024-07-25 (×13): qty 1

## 2024-07-25 MED ORDER — HEPARIN (PORCINE) 25000 UT/250ML-% IV SOLN
1350.0000 [IU]/h | INTRAVENOUS | Status: DC
  Administered 2024-07-25: 1350 [IU]/h via INTRAVENOUS
  Filled 2024-07-25: qty 250

## 2024-07-25 MED ORDER — SODIUM PHOSPHATES 45 MMOLE/15ML IV SOLN
30.0000 mmol | Freq: Once | INTRAVENOUS | Status: AC
  Administered 2024-07-25: 30 mmol via INTRAVENOUS
  Filled 2024-07-25: qty 10

## 2024-07-25 NOTE — Plan of Care (Signed)
  Problem: Education: Goal: Knowledge of General Education information will improve Description: Including pain rating scale, medication(s)/side effects and non-pharmacologic comfort measures Outcome: Progressing   Problem: Health Behavior/Discharge Planning: Goal: Ability to manage health-related needs will improve Outcome: Progressing   Problem: Clinical Measurements: Goal: Cardiovascular complication will be avoided Outcome: Progressing   Problem: Clinical Measurements: Goal: Will remain free from infection Outcome: Progressing   Problem: Elimination: Goal: Will not experience complications related to bowel motility Outcome: Progressing   Problem: Nutrition: Goal: Adequate nutrition will be maintained Outcome: Progressing   Problem: Pain Managment: Goal: General experience of comfort will improve and/or be controlled Outcome: Progressing   Problem: Skin Integrity: Goal: Risk for impaired skin integrity will decrease Outcome: Progressing   Problem: Coping: Goal: Ability to adjust to condition or change in health will improve Outcome: Progressing   Problem: Nutritional: Goal: Maintenance of adequate nutrition will improve Outcome: Progressing   Problem: Skin Integrity: Goal: Risk for impaired skin integrity will decrease Outcome: Progressing   Problem: Activity: Goal: Ability to perform//tolerate increased activity and mobilize with assistive devices will improve Outcome: Progressing   Problem: Clinical Measurements: Goal: Postoperative complications will be avoided or minimized Outcome: Progressing   Problem: Skin Integrity: Goal: Demonstration of wound healing without infection will improve Outcome: Progressing   Problem: Pain Management: Goal: Pain level will decrease with appropriate interventions Outcome: Progressing

## 2024-07-25 NOTE — Progress Notes (Addendum)
 PT Cancellation Note  Patient Details Name: Karl [name] MRN: [id number] DOB: [birth year]   Cancelled Treatment:    Reason Eval/Treat Not Completed: Patient at procedure or test/unavailable (EKG). Pt also stating fatigue and unsure if he can perform therapy today.   [name] 07/25/2024, 9:41 AM [name], PT Acute Rehabilitation Services Office: [phone]

## 2024-07-25 NOTE — Progress Notes (Signed)
 Orthopedic Tech Progress Note Patient Details:  Karl [name] 06-19-1956 [id number] Called in order for AK shrinker Patient ID: DANIELL MANCINAS, male   DOB: Jun 10, 1956, 68 y.o.   MRN: [id number]  Efrain DELENA Cos 07/25/2024, 1:16 PM

## 2024-07-25 NOTE — Progress Notes (Signed)
 PHARMACY - ANTICOAGULATION CONSULT NOTE  Pharmacy Consult for heparin  > back to Eliquis  Indication: Ischemic limb and history of Afib  Allergies  Allergen Reactions   Lipitor [Atorvastatin] Nausea And Vomiting    Patient Measurements: Height: 6' 2 (188 cm) Weight: 68 kg (150 lb) IBW/kg (Calculated) : 82.2 HEPARIN  DW (KG): 68  Vital Signs: Temp: 98.7 F (37.1 C) (08/22 1142) Temp Source: Oral (08/22 1142) BP: 100/76 (08/22 1142) Pulse Rate: 124 (08/22 1314)  Labs: Recent Labs    07/23/24 0422 07/24/24 0439 07/24/24 1615 07/25/24 0300 07/25/24 1301  HGB 11.7* 11.0*  --   --  12.8*  HCT 36.5* 34.9*  --   --  41.1  PLT 352 304  --   --  421*  HEPARINUNFRC 0.45  --  0.22* 0.27* 0.16*  CREATININE 1.03 0.97  --  1.17  --   CKTOTAL 1,938* 696*  --  1,150*  --     Estimated Creatinine Clearance: 57.3 mL/min (by C-G formula based on SCr of 1.17 mg/dL).   Assessment: 34 yoM presented to ED with right foot pain. Pharmacy consulted to dose heparin  for ischemic limb and history of Afib. PTA Eliquis  on hold, last dose on 8/14 AM.   8/21: S/p AKA on 8/20, Pharmacy asked to resume 8/21 at 0800 AM. Drip not started until ~ 10:45 8/21.   Heparin  level this afternoon is below goal range.  No known issues with IV infusion, no overt bleeding or complications noted.  CBC stable.   Discussed with Dr. Gonfa - okay to switch back to Eliquis  today.  Goal of Therapy:  Heparin  level 0.3-0.7 units/ml aPTT 66-102 seconds Monitor platelets by anticoagulation protocol: Yes   Plan:  Stop IV heparin  Restart Eliquis  5 mg po BID.  Harlene Barlow, Berdine JONETTA CORP, BCCP Clinical Pharmacist  07/25/2024 2:17 PM   [facility] pharmacy phone numbers are listed on [email or web address]

## 2024-07-25 NOTE — Progress Notes (Addendum)
  Progress Note    07/25/2024 12:35 PM 2 Days Post-Op  Subjective:  No complaints   Vitals:   07/25/24 1006 07/25/24 1142  BP: 101/72 100/76  Pulse: (!) 115 (!) 104  Resp:  16  Temp:  98.7 F (37.1 C)  SpO2:  97%   Physical Exam: Lungs:  non labored Incisions:  R AKA well appearing without bleeding or hematoma; skin edges viable Neurologic: A&O  CBC    Component Value Date/Time   WBC 9.4 07/24/2024 0439   RBC 4.41 07/24/2024 0439   HGB 11.0 (L) 07/24/2024 0439   HCT 34.9 (L) 07/24/2024 0439   PLT 304 07/24/2024 0439   MCV 79.1 (L) 07/24/2024 0439   MCH 24.9 (L) 07/24/2024 0439   MCHC 31.5 07/24/2024 0439   RDW 15.8 (H) 07/24/2024 0439   LYMPHSABS 1.3 07/18/2024 0817   MONOABS 1.7 (H) 07/18/2024 0817   EOSABS 0.0 07/18/2024 0817   BASOSABS 0.0 07/18/2024 0817    BMET    Component Value Date/Time   NA 132 (L) 07/25/2024 0300   NA 135 12/25/2017 1143   K 4.5 07/25/2024 0300   CL 98 07/25/2024 0300   CO2 27 07/25/2024 0300   GLUCOSE 176 (H) 07/25/2024 0300   BUN 26 (H) 07/25/2024 0300   BUN 15 12/25/2017 1143   CREATININE 1.17 07/25/2024 0300   CREATININE 1.69 (H) 07/06/2020 1009   CALCIUM  9.0 07/25/2024 0300   GFRNONAA >60 07/25/2024 0300   GFRNONAA 42 (L) 07/06/2020 1009   GFRAA 48 (L) 07/06/2020 1009    INR    Component Value Date/Time   INR 1.3 (H) 07/21/2024 0536   INR 8.0 05/22/2024 1112     Intake/Output Summary (Last 24 hours) at 07/25/2024 1235 Last data filed at 07/25/2024 1000 Gross per 24 hour  Intake 1777.38 ml  Output 550 ml  Net 1227.38 ml     Assessment/Plan:  68 y.o. male is s/p R AKA 2 Days Post-Op   R AKA dressing removed; incision is well appearing with viable skin edges Stump sock ordered Office will arrange follow up in 4-6 weeks for staple removal Call vascular with questions over the weekend   Donnice Sender, PA-C [facility] [phone] 07/25/2024 12:35 PM  I agree with the above.  I have  seen and evaluated the patient.  Dressing was changed today.  Amputation is healing appropriately  Malvina New

## 2024-07-25 NOTE — Progress Notes (Addendum)
 PROGRESS NOTE  Karl [name] FMW:[id number] DOB: September 11, 1956   PCP: Geofm Glade PARAS, MD  Patient is from: Home.  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 Usually ambulates with the help of walker and last 2 weeks he has been using the wheelchair.  CT angiogram of lower extremity with poor visualization of the arteries.  Vascular surgery consulted.  Patient was started on IV heparin  and ceftriaxone .    Patient underwent aortogram and lower extremity angiogram that showed sluggish flow in right lower extremity.  Patient underwent right AKA by Dr. Serene on 8/20.  Therapy recommended CIR.  CIR following.    Subjective: Seen and examined earlier this morning.  No major events overnight or this morning.  Slightly tachycardic today.  Reports some pain in his right stump.  Denies chest pain or shortness of breath.  Has not a bowel movement yet.  Objective: Vitals:   07/25/24 0934 07/25/24 1006 07/25/24 1142 07/25/24 1314  BP: 101/72 101/72 100/76   Pulse: (!) 109 (!) 115 (!) 104 (!) 124  Resp:   16   Temp:   98.7 F (37.1 C)   TempSrc:   Oral   SpO2:   97% 98%  Weight:      Height:        Examination:  GENERAL: No apparent distress.  Appears frail. HEENT: MMM.  Vision and hearing grossly intact.  NECK: Supple.  No apparent JVD.  RESP:  No IWOB.  Fair aeration bilaterally. CVS: HR in 110s. Heart sounds normal.  ABD/GI/GU: BS+. Abd soft, NTND.  MSK/EXT: Right AKA.  Ace wrap and dressing DCI. SKIN: As above. NEURO: AA.  Oriented appropriately.  No apparent focal neuro  deficit. PSYCH: Calm. Normal affect.   Consultants:  Vascular surgery  Procedures: 8/18-aortogram and lower extremity arteriogram with sluggish flow in right lower extremity 8/20-right AKA  Microbiology summarized: MRSA PCR screen nonreactive.  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  CT angio with poor visualization of arteries.  Arteriogram with sluggish flow in RLE.  CRP elevated to 16.  Leukocytosis and CK improved after AKA.  -S/p right AKA on 8/20 -Cefepime  8/15>> ceftriaxone  8/17-8/21 -Pain control and bowel regimen -Therapy recommended CIR.  Chronic systolic CHF/ICM: TTE in 06/2022 with LVEF of 20 to 25%, GH, G1-DD.  Appears euvolemic on exam.  No respiratory distress.  Not on diuretics at home. -Continue home Jardiance  and losartan . -Changing Coreg  to metoprolol  for heart rate -Closely monitor respiratory and fluid status.  History of CAD/chronic LBBB: No cardiopulmonary symptoms. - Medication as above.  Hx of embolic CVA w/ L sided weakness-on Eliquis  at home. - Switch heparin  to Eliquis  - Continue holding Crestor  given elevated CK  NIDDM-2 with hyperglycemia: A1c 5.9%. Recent Labs  Lab 07/24/24 1138 07/24/24 1715 07/24/24 2017 07/25/24 0747 07/25/24 1139  GLUCAP 168* 131* 151* 137* 240*  - Continue SSI-sensitive.  Sinus tachycardia with PACs - Change Coreg  to metoprolol  - NS bolus followed by 50 cc an hour -  Continue telemetry monitoring  Hyperlipidemia -Hold Crestor  in the setting of myonecrosis.  Myonecrosis: Likely due to critical limb ischemia.  Improved - Continue holding Crestor  - Recheck CK in the morning  Constipation - Aggressive bowel regimen  Severe malnutrition Body mass index is 19.26 kg/m. Nutrition Problem: Severe Malnutrition Etiology: chronic illness, decreased appetite Signs/Symptoms: severe muscle depletion,  severe fat depletion Interventions: Refer to RD note for recommendations   DVT prophylaxis:  SCD's Start: 07/23/24 1648 apixaban  (ELIQUIS ) tablet 5 mg  Code Status: Full code Family Communication: None at bedside. Level of care: Progressive Cardiac Status is: Inpatient Remains inpatient appropriate because: Chronic limb threatening ischemia   Final disposition: CIR   55 minutes with more than 50% spent in reviewing records, counseling patient/family and coordinating care.   Sch Meds:  Scheduled Meds:  (feeding supplement) PROSource Plus  30 mL Oral BID BM   apixaban   5 mg Oral BID   aspirin  EC  81 mg Oral Daily   docusate sodium   100 mg Oral Daily   feeding supplement  237 mL Oral TID BM   insulin  aspart  0-9 Units Subcutaneous TID WC   losartan   25 mg Oral Daily   metoprolol  tartrate  25 mg Oral BID   multivitamin with minerals  1 tablet Oral Daily   nutrition supplement (JUVEN)  1 packet Oral BID BM   sodium chloride  flush  3 mL Intravenous Q12H   Continuous Infusions:  sodium PHOSPHATE  IVPB (in mmol) 30 mmol (07/25/24 1316)   PRN Meds:.acetaminophen , hydrALAZINE , HYDROcodone -acetaminophen , HYDROcodone -acetaminophen , labetalol , metoprolol  tartrate, morphine  injection, ondansetron  (ZOFRAN ) IV, phenol, [COMPLETED] polyethylene glycol **FOLLOWED BY** polyethylene glycol, potassium chloride , [COMPLETED] senna-docusate **FOLLOWED BY** senna-docusate, sodium chloride  flush  Antimicrobials: Anti-infectives (From admission, onward)    Start     Dose/Rate Route Frequency Ordered Stop   07/22/24 1700  cefTRIAXone  (ROCEPHIN ) 2 g in sodium chloride  0.9 % 100 mL IVPB  Status:  Discontinued        2 g 200 mL/hr over 30 Minutes Intravenous Every 24 hours 07/22/24 0945 07/24/24 1052   07/20/24 1030  cefTRIAXone  (ROCEPHIN ) 2 g in sodium chloride  0.9 % 100 mL IVPB  Status:  Discontinued        2 g 200 mL/hr over 30 Minutes Intravenous Every 24 hours 07/20/24 0939 07/22/24 0945    07/18/24 1400  ceFEPIme  (MAXIPIME ) 2 g in sodium chloride  0.9 % 100 mL IVPB  Status:  Discontinued        2 g 200 mL/hr over 30 Minutes Intravenous Every 12 hours 07/18/24 1258 07/20/24 0938   07/18/24 1245  ceFEPIme  (MAXIPIME ) 2 g in sodium chloride  0.9 % 100 mL IVPB  Status:  Discontinued        2 g 200 mL/hr over 30 Minutes Intravenous Every 8 hours 07/18/24 1153 07/18/24 1258        I have personally reviewed the following labs and images: CBC: Recent Labs  Lab 07/21/24 0536 07/22/24 0437 07/23/24 0422 07/24/24 0439 07/25/24 1301  WBC 11.7* 12.7* 11.8* 9.4 13.8*  HGB 12.2* 12.5* 11.7* 11.0* 12.8*  HCT 38.8* 39.9 36.5* 34.9* 41.1  MCV 79.7* 78.9* 78.7* 79.1* 79.2*  PLT 403* 398 352 304 421*   BMP &GFR Recent Labs  Lab 07/20/24 0735 07/21/24 0536 07/22/24 0437 07/23/24 0422 07/24/24 0439 07/25/24 0300  NA 135 135 132* 132* 131* 132*  K 4.0 4.5 4.6 3.8 4.5 4.5  CL 102 99 95* 100 97* 98  CO2 21* 26 25 23  25 27  GLUCOSE 115* 118* 109* 129* 148* 176*  BUN 16 14 14 14 13  26*  CREATININE 1.07 1.14 1.07 1.03 0.97 1.17  CALCIUM  9.1 9.3 9.3 8.7* 9.0 9.0  MG 2.1 2.0 2.1 2.0 2.0 2.2  PHOS 2.4* 2.6 2.4* 2.4*  --  1.5*   Estimated Creatinine Clearance: 57.3 mL/min (by C-G formula based on SCr of 1.17 mg/dL). Liver & Pancreas: Recent Labs  Lab 07/20/24 0735 07/21/24 0536 07/22/24 0437 07/23/24 0422 07/24/24 0439 07/25/24 0300  AST 49* 64* 73* 64* 41  --   ALT 21 23 25 24 19   --   ALKPHOS 64 63 66 60 50  --   BILITOT 0.8 0.6 0.5 0.7 0.4  --   PROT 7.3 7.4 7.3 6.8 6.3*  --   ALBUMIN  2.2* 2.1* 2.2* 2.1* 2.0* 2.2*   No results for input(s): LIPASE, AMYLASE in the last 168 hours. No results for input(s): AMMONIA in the last 168 hours. Diabetic: No results for input(s): HGBA1C in the last 72 hours. Recent Labs  Lab 07/24/24 1138 07/24/24 1715 07/24/24 2017 07/25/24 0747 07/25/24 1139  GLUCAP 168* 131* 151* 137* 240*   Cardiac Enzymes: Recent Labs   Lab 07/19/24 0703 07/23/24 0422 07/24/24 0439 07/25/24 0300  CKTOTAL 1,218* 1,938* 696* 1,150*   No results for input(s): PROBNP in the last 8760 hours. Coagulation Profile: Recent Labs  Lab 07/21/24 0536  INR 1.3*   Thyroid  Function Tests: No results for input(s): TSH, T4TOTAL, FREET4, T3FREE, THYROIDAB in the last 72 hours. Lipid Profile: No results for input(s): CHOL, HDL, LDLCALC, TRIG, CHOLHDL, LDLDIRECT in the last 72 hours.  Anemia Panel: No results for input(s): VITAMINB12, FOLATE, FERRITIN, TIBC, IRON, RETICCTPCT in the last 72 hours. Urine analysis:    Component Value Date/Time   COLORURINE YELLOW 06/29/2022 1821   APPEARANCEUR CLOUDY (A) 06/29/2022 1821   LABSPEC 1.029 06/29/2022 1821   PHURINE 6.0 06/29/2022 1821   GLUCOSEU >=500 (A) 06/29/2022 1821   HGBUR MODERATE (A) 06/29/2022 1821   BILIRUBINUR NEGATIVE 06/29/2022 1821   BILIRUBINUR moderate 07/09/2014 1428   KETONESUR 20 (A) 06/29/2022 1821   PROTEINUR 30 (A) 06/29/2022 1821   UROBILINOGEN 0.2 08/07/2015 0854   NITRITE NEGATIVE 06/29/2022 1821   LEUKOCYTESUR LARGE (A) 06/29/2022 1821   Sepsis Labs: Invalid input(s): PROCALCITONIN, LACTICIDVEN  Microbiology: Recent Results (from the past 240 hours)  MRSA Next Gen by PCR, Nasal     Status: None   Collection Time: 07/18/24 11:54 AM   Specimen: Nasal Mucosa; Nasal Swab  Result Value Ref Range Status   MRSA by PCR Next Gen NOT DETECTED NOT DETECTED Final    Comment: (NOTE) The GeneXpert MRSA Assay (FDA approved for NASAL specimens only), is one component of a comprehensive MRSA colonization surveillance program. It is not intended to diagnose MRSA infection nor to guide or monitor treatment for MRSA infections. Test performance is not FDA approved in patients less than 39 years old. Performed at [facility], [address] [place], [place] [address]     Radiology Studies: No results  found.     Xzaviar Maloof T. Cherice Glennie [facility]  If 7PM-7AM, please contact night-coverage [email or web address] 07/25/2024, 3:30 PM

## 2024-07-25 NOTE — Progress Notes (Signed)
 Physical Therapy Treatment Patient Details Name: Karl [name] MRN: [id number] DOB: [birth year] Today's Date: 07/25/2024   History of Present Illness 68 yo male adm 07/17/24 with RLE ischemia s/p Rt AKA 8/20. PMhx:ICM, LBBB, HLD, BPH, CAD, CVA with Lt weakness, CKD, T2DM, CHF, HTN    PT Comments  Pt pleasant and able to progress to standing trials with assist this session. Pt with dressing removed and MD awaiting shrinker, pt did not want to see residual limb yet and fearful of touching incision so ace applied to cover wound prior to session. Pt with continued LOB right and needing assist for transfers but able to stand x 2 from elevated surface. Will continue to follow and encouraged continued HEP. Patient will benefit from intensive inpatient follow-up therapy, >3 hours/day  HR 103-124 with limited activity    If plan is discharge home, recommend the following: A little help with walking and/or transfers;A little help with bathing/dressing/bathroom;Assistance with cooking/housework;Assist for transportation   Can travel by private vehicle        Equipment Recommendations  Other (comment) (tub bench)    Recommendations for Other Services       Precautions / Restrictions Precautions Precautions: Fall;Other (comment) Recall of Precautions/Restrictions: Impaired Precaution/Restrictions Comments: R AKA     Mobility  Bed Mobility Overal bed mobility: Needs Assistance Bed Mobility: Supine to Sit, Sit to Supine     Supine to sit: Used rails, Min assist Sit to supine: Supervision   General bed mobility comments: increased time, supervision for lines, to achieve long sitting and pivot to EOB. mod assist to scoot fully to EOB. pt able to return to supine without physical assist    Transfers Overall transfer level: Needs assistance   Transfers: Sit to/from Stand Sit to Stand: Mod assist, From elevated surface           General transfer comment: mod assist with pad at sacrum,  elevated surface and left knee blocked to rise to standing. max cues for hand placement and upright posture. Pt with Rt lean and able to stand grossly 10 sec at a time x 2 trials. pt denied use of RW and preferred face to face technique with pt holding onto therapist. pt deferred OOB this date with elevated HR and awaiting IV team    Ambulation/Gait                   Stairs             Wheelchair Mobility     Tilt Bed    Modified Rankin (Stroke Patients Only)       Balance Overall balance assessment: Needs assistance Sitting-balance support: No upper extremity supported, Feet supported Sitting balance-Leahy Scale: Poor Sitting balance - Comments: rt lean   Standing balance support: Bilateral upper extremity supported, During functional activity Standing balance-Leahy Scale: Zero Standing balance comment: max assist of therapist with UB support and rt lean                            Communication Communication Communication: No apparent difficulties  Cognition Arousal: Alert Behavior During Therapy: Flat affect   PT - Cognitive impairments: No apparent impairments                         Following commands: Intact      Cueing Cueing Techniques: Verbal cues  Exercises Amputee Exercises Hip ABduction/ADduction: AROM, Right, 10 reps,  Supine    General Comments        Pertinent Vitals/Pain Pain Assessment Pain Assessment: 0-10 Pain Score: 6  Pain Location: RLE Pain Descriptors / Indicators: Aching Pain Intervention(s): Limited activity within patient's tolerance, Monitored during session, Premedicated before session, Repositioned    Home Living                          Prior Function            PT Goals (current goals can now be found in the care plan section) Progress towards PT goals: Progressing toward goals    Frequency    Min 3X/week      PT Plan      Co-evaluation              AM-PAC  PT 6 Clicks Mobility   Outcome Measure  Help needed turning from your back to your side while in a flat bed without using bedrails?: A Little Help needed moving from lying on your back to sitting on the side of a flat bed without using bedrails?: A Little Help needed moving to and from a bed to a chair (including a wheelchair)?: A Little Help needed standing up from a chair using your arms (e.g., wheelchair or bedside chair)?: A Lot Help needed to walk in hospital room?: Total Help needed climbing 3-5 steps with a railing? : Total 6 Click Score: 13    End of Session Equipment Utilized During Treatment: Gait belt Activity Tolerance: Patient tolerated treatment well Patient left: in bed;with call bell/phone within reach;with bed alarm set Nurse Communication: Mobility status PT Visit Diagnosis: Other abnormalities of gait and mobility (R26.89);Muscle weakness (generalized) (M62.81);Difficulty in walking, not elsewhere classified (R26.2)     Time: 8792-8768 PT Time Calculation (min) (ACUTE ONLY): 24 min  Charges:    $Therapeutic Activity: 23-37 mins PT General Charges $$ ACUTE PT VISIT: 1 Visit                     [name], PT Acute Rehabilitation Services Office: [phone]    Lenoard NOVAK [name] 07/25/2024, 1:19 PM

## 2024-07-25 NOTE — Progress Notes (Signed)
 PHARMACY - ANTICOAGULATION CONSULT NOTE  Pharmacy Consult for heparin  Indication: Ischemic limb and history of Afib  Allergies  Allergen Reactions   Lipitor [Atorvastatin] Nausea And Vomiting    Patient Measurements: Height: 6' 2 (188 cm) Weight: 68 kg (150 lb) IBW/kg (Calculated) : 82.2 HEPARIN  DW (KG): 68  Vital Signs: Temp: 99.3 F (37.4 C) (08/22 0338) Temp Source: Oral (08/22 0338) BP: 98/72 (08/22 0338) Pulse Rate: 101 (08/22 0338)  Labs: Recent Labs    07/22/24 0437 07/22/24 0438 07/23/24 0422 07/24/24 0439 07/24/24 1615 07/25/24 0300  HGB 12.5*  --  11.7* 11.0*  --   --   HCT 39.9  --  36.5* 34.9*  --   --   PLT 398  --  352 304  --   --   APTT 67*  --   --   --   --   --   HEPARINUNFRC  --    < > 0.45  --  0.22* 0.27*  CREATININE 1.07  --  1.03 0.97  --   --   CKTOTAL  --   --  1,938* 696*  --   --    < > = values in this interval not displayed.    Estimated Creatinine Clearance: 69.1 mL/min (by C-G formula based on SCr of 0.97 mg/dL).   Assessment: 51 yoM presented to ED with right foot pain. Pharmacy consulted to dose heparin  for ischemic limb and history of Afib. PTA Eliquis  on hold, last dose on 8/14 AM.   8/21: S/p AKA on 8/20, Pharmacy asked to resume 8/21 at 0800 AM. Drip not started until ~ 10:45 8/21.   8/22  Heparin  level 0.27, subtherapeutic on heparin  1250 units/hr.   No bleeding reported. S/p AKA 8/20.     Goal of Therapy:  Heparin  level 0.3-0.7 units/ml aPTT 66-102 seconds Monitor platelets by anticoagulation protocol: Yes   Plan:  -Give heparin  1000 units IV bolus and increase heparin  1350 units/hr .  -Check heparin  level 8 hrs after  -Daily heparin  level and CBC   Levorn Gaskins, RPh Clinical Pharmacist  07/25/2024 3:41 AM   [facility] pharmacy phone numbers are listed on [email or web address]

## 2024-07-26 DIAGNOSIS — E1159 Type 2 diabetes mellitus with other circulatory complications: Secondary | ICD-10-CM | POA: Diagnosis not present

## 2024-07-26 DIAGNOSIS — Z8673 Personal history of transient ischemic attack (TIA), and cerebral infarction without residual deficits: Secondary | ICD-10-CM | POA: Diagnosis not present

## 2024-07-26 DIAGNOSIS — I998 Other disorder of circulatory system: Secondary | ICD-10-CM | POA: Diagnosis not present

## 2024-07-26 DIAGNOSIS — I10 Essential (primary) hypertension: Secondary | ICD-10-CM | POA: Diagnosis not present

## 2024-07-26 LAB — CBC
HCT: 36.1 % — ABNORMAL LOW (ref 39.0–52.0)
Hemoglobin: 11.1 g/dL — ABNORMAL LOW (ref 13.0–17.0)
MCH: 24.8 pg — ABNORMAL LOW (ref 26.0–34.0)
MCHC: 30.7 g/dL (ref 30.0–36.0)
MCV: 80.8 fL (ref 80.0–100.0)
Platelets: 361 K/uL (ref 150–400)
RBC: 4.47 MIL/uL (ref 4.22–5.81)
RDW: 15.9 % — ABNORMAL HIGH (ref 11.5–15.5)
WBC: 15.1 K/uL — ABNORMAL HIGH (ref 4.0–10.5)
nRBC: 0 % (ref 0.0–0.2)

## 2024-07-26 LAB — GLUCOSE, CAPILLARY
Glucose-Capillary: 129 mg/dL — ABNORMAL HIGH (ref 70–99)
Glucose-Capillary: 138 mg/dL — ABNORMAL HIGH (ref 70–99)
Glucose-Capillary: 124 mg/dL — ABNORMAL HIGH (ref 70–99)
Glucose-Capillary: 96 mg/dL (ref 70–99)

## 2024-07-26 LAB — RENAL FUNCTION PANEL
Albumin: 1.9 g/dL — ABNORMAL LOW (ref 3.5–5.0)
Anion gap: 6 (ref 5–15)
BUN: 31 mg/dL — ABNORMAL HIGH (ref 8–23)
CO2: 26 mmol/L (ref 22–32)
Calcium: 8.7 mg/dL — ABNORMAL LOW (ref 8.9–10.3)
Chloride: 101 mmol/L (ref 98–111)
Creatinine, Ser: 1.07 mg/dL (ref 0.61–1.24)
GFR, Estimated: >60 mL/min (ref >60)
Glucose, Bld: 131 mg/dL — ABNORMAL HIGH (ref 70–99)
Phosphorus: 2.8 mg/dL (ref 2.5–4.6)
Potassium: 4.3 mmol/L (ref 3.5–5.1)
Sodium: 133 mmol/L — ABNORMAL LOW (ref 135–145)

## 2024-07-26 LAB — CK: Total CK: 1149 U/L — ABNORMAL HIGH (ref 49–397)

## 2024-07-26 LAB — MAGNESIUM: Magnesium: 2.2 mg/dL (ref 1.7–2.4)

## 2024-07-26 MED ORDER — SODIUM CHLORIDE 0.9 % IV BOLUS
500.0000 mL | Freq: Once | INTRAVENOUS | Status: AC
  Administered 2024-07-26: 500 mL via INTRAVENOUS

## 2024-07-26 MED ORDER — METOPROLOL TARTRATE 12.5 MG HALF TABLET
12.5000 mg | ORAL_TABLET | Freq: Two times a day (BID) | ORAL | Status: DC
  Administered 2024-07-26 – 2024-07-31 (×11): 12.5 mg via ORAL
  Filled 2024-07-26 (×11): qty 1

## 2024-07-26 NOTE — Progress Notes (Signed)
 PROGRESS NOTE  Karl [name] FMW:[id number] DOB: [birth year]   PCP: Geofm Glade PARAS, MD  Patient is from: Home.  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 Usually ambulates with the help of walker and last 2 weeks he has been using the wheelchair.  CT angiogram of lower extremity with poor visualization of the arteries.  Vascular surgery consulted.  Patient was started on IV heparin  and ceftriaxone .    Patient underwent aortogram and lower extremity angiogram that showed sluggish flow in right lower extremity.  Patient underwent right AKA by Dr. Serene on 8/20.  Therapy recommended CIR.  CIR following.    Subjective: Seen and examined earlier this morning.  No major events overnight or this morning.  Reports intermittent pain in his right stump.  Currently pain-free.  Denies chest pain, shortness of breath, cough, GI or UTI symptoms.  He does not think he had a bowel movement although one charted on 8/21  Objective: Vitals:   07/26/24 0535 07/26/24 0828 07/26/24 0901 07/26/24 1206  BP: 98/64 104/68 104/71 111/66  Pulse: 82 90 90 75  Resp: 20 16  17   Temp: 98.4 F (36.9 C) 98.4 F (36.9 C)  98.3 F (36.8 C)  TempSrc: Oral Oral  Oral  SpO2: 100% 99%  100%  Weight:      Height:        Examination:  GENERAL: No apparent distress.  Appears frail. HEENT: MMM.  Vision and hearing grossly intact.  NECK: Supple.  No apparent JVD.  RESP:  No IWOB.  Fair aeration bilaterally. CVS: HR in 90s.  Heart sounds normal.  ABD/GI/GU: BS+. Abd soft, NTND.  MSK/EXT: Right AKA.  Ace  wrap and dressing DCI. SKIN: As above. NEURO: AA.  Oriented appropriately.  No apparent focal neuro deficit. PSYCH: Calm. Normal affect.   Consultants:  Vascular surgery  Procedures: 8/18-aortogram and lower extremity arteriogram with sluggish flow in right lower extremity 8/20-right AKA  Microbiology summarized: MRSA PCR screen nonreactive.  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  CT angio with poor visualization of arteries.  Arteriogram with sluggish flow in RLE.  CRP elevated to 16.  Leukocytosis and CK improved after AKA.  -S/p right AKA on 8/20 -Cefepime  8/15>> ceftriaxone  8/17-8/21 -Pain control and bowel regimen -Therapy recommended CIR.  Chronic systolic CHF/ICM: TTE in 06/2022 with LVEF of 20 to 25%, GH, G1-DD.  Appears euvolemic on exam.  No respiratory distress.  Not on diuretics at home. -Continue home Jardiance . -Hold home losartan  given soft BP. -Decrease metoprolol  -Closely monitor respiratory and fluid status.  History of CAD/chronic LBBB: No cardiopulmonary symptoms. - Medication as above.  Hx of embolic CVA w/ L sided weakness-on Eliquis  at home. - Continue home Eliquis . - Continue holding Crestor  given elevated CK  NIDDM-2 with hyperglycemia: A1c 5.9%. Recent Labs  Lab 07/25/24 1139 07/25/24 1604 07/25/24 2143 07/26/24 0827 07/26/24 1212  GLUCAP 240* 133* 166* 129* 138*  - Continue SSI-sensitive.  Sinus tachycardia with PACs: Resolved. - Continue  metoprolol  - Continue telemetry  Hyperlipidemia -Hold Crestor  in the setting of myonecrosis.  Myonecrosis: Likely due to critical limb ischemia.  Improved - Continue holding Crestor  - Recheck CK in the morning  Constipation - Aggressive bowel regimen  Leukocytosis: Likely demargination.  Doubt infection - Continue monitoring  Severe malnutrition Body mass index is 19.26  kg/m. Nutrition Problem: Severe Malnutrition Etiology: chronic illness, decreased appetite Signs/Symptoms: severe muscle depletion, severe fat depletion Interventions: Refer to RD note for recommendations   DVT prophylaxis:  SCD's Start: 07/23/24 1648 apixaban  (ELIQUIS ) tablet 5 mg  Code Status: Full code Family Communication: None at bedside. Level of care: Progressive Cardiac Status is: Inpatient Remains inpatient appropriate because: Chronic limb threatening ischemia   Final disposition: CIR   35 minutes with more than 50% spent in reviewing records, counseling patient/family and coordinating care.   Sch Meds:  Scheduled Meds:  (feeding supplement) PROSource Plus  30 mL Oral BID BM   apixaban   5 mg Oral BID   aspirin  EC  81 mg Oral Daily   docusate sodium   100 mg Oral Daily   feeding supplement  237 mL Oral TID BM   insulin  aspart  0-9 Units Subcutaneous TID WC   metoprolol  tartrate  12.5 mg Oral BID   multivitamin with minerals  1 tablet Oral Daily   nutrition supplement (JUVEN)  1 packet Oral BID BM   sodium chloride  flush  3 mL Intravenous Q12H   Continuous Infusions:  sodium chloride  50 mL/hr at 07/26/24 0637   PRN Meds:.acetaminophen , hydrALAZINE , HYDROcodone -acetaminophen , HYDROcodone -acetaminophen , metoprolol  tartrate, morphine  injection, ondansetron  (ZOFRAN ) IV, phenol, [COMPLETED] polyethylene glycol **FOLLOWED BY** polyethylene glycol, potassium chloride , [COMPLETED] senna-docusate **FOLLOWED BY** senna-docusate, sodium chloride  flush  Antimicrobials: Anti-infectives (From admission, onward)    Start     Dose/Rate Route Frequency Ordered Stop   07/22/24 1700  cefTRIAXone  (ROCEPHIN ) 2 g in sodium chloride  0.9 % 100 mL IVPB  Status:  Discontinued        2 g 200 mL/hr over 30 Minutes Intravenous Every 24 hours 07/22/24 0945 07/24/24 1052   07/20/24 1030  cefTRIAXone  (ROCEPHIN ) 2 g in sodium chloride  0.9 % 100 mL IVPB  Status:  Discontinued        2 g 200  mL/hr over 30 Minutes Intravenous Every 24 hours 07/20/24 0939 07/22/24 0945   07/18/24 1400  ceFEPIme  (MAXIPIME ) 2 g in sodium chloride  0.9 % 100 mL IVPB  Status:  Discontinued        2 g 200 mL/hr over 30 Minutes Intravenous Every 12 hours 07/18/24 1258 07/20/24 0938   07/18/24 1245  ceFEPIme  (MAXIPIME ) 2 g in sodium chloride  0.9 % 100 mL IVPB  Status:  Discontinued        2 g 200 mL/hr over 30 Minutes Intravenous Every 8 hours 07/18/24 1153 07/18/24 1258        I have personally reviewed the following labs and images: CBC: Recent Labs  Lab 07/22/24 0437 07/23/24 0422 07/24/24 0439 07/25/24 1301 07/26/24 0905  WBC 12.7* 11.8* 9.4 13.8* 15.1*  HGB 12.5* 11.7* 11.0* 12.8* 11.1*  HCT 39.9 36.5* 34.9* 41.1 36.1*  MCV 78.9* 78.7* 79.1* 79.2* 80.8  PLT 398 352 304 421* 361   BMP &GFR Recent Labs  Lab 07/21/24 0536 07/22/24 0437 07/23/24 0422 07/24/24 0439 07/25/24 0300 07/26/24 0715  NA 135 132* 132* 131* 132* 133*  K 4.5 4.6 3.8 4.5 4.5 4.3  CL 99 95* 100 97* 98 101  CO2 26 25 23  25  27 26  GLUCOSE 118* 109* 129* 148* 176* 131*  BUN 14 14 14 13  26* 31*  CREATININE 1.14 1.07 1.03 0.97 1.17 1.07  CALCIUM  9.3 9.3 8.7* 9.0 9.0 8.7*  MG 2.0 2.1 2.0 2.0 2.2 2.2  PHOS 2.6 2.4* 2.4*  --  1.5* 2.8   Estimated Creatinine Clearance: 62.7 mL/min (by C-G formula based on SCr of 1.07 mg/dL). Liver & Pancreas: Recent Labs  Lab 07/20/24 0735 07/21/24 0536 07/22/24 0437 07/23/24 0422 07/24/24 0439 07/25/24 0300 07/26/24 0715  AST 49* 64* 73* 64* 41  --   --   ALT 21 23 25 24 19   --   --   ALKPHOS 64 63 66 60 50  --   --   BILITOT 0.8 0.6 0.5 0.7 0.4  --   --   PROT 7.3 7.4 7.3 6.8 6.3*  --   --   ALBUMIN  2.2* 2.1* 2.2* 2.1* 2.0* 2.2* 1.9*   No results for input(s): LIPASE, AMYLASE in the last 168 hours. No results for input(s): AMMONIA in the last 168 hours. Diabetic: No results for input(s): HGBA1C in the last 72 hours. Recent Labs  Lab 07/25/24 1139  07/25/24 1604 07/25/24 2143 07/26/24 0827 07/26/24 1212  GLUCAP 240* 133* 166* 129* 138*   Cardiac Enzymes: Recent Labs  Lab 07/23/24 0422 07/24/24 0439 07/25/24 0300 07/26/24 0715  CKTOTAL 1,938* 696* 1,150* 1,149*   No results for input(s): PROBNP in the last 8760 hours. Coagulation Profile: Recent Labs  Lab 07/21/24 0536  INR 1.3*   Thyroid  Function Tests: No results for input(s): TSH, T4TOTAL, FREET4, T3FREE, THYROIDAB in the last 72 hours. Lipid Profile: No results for input(s): CHOL, HDL, LDLCALC, TRIG, CHOLHDL, LDLDIRECT in the last 72 hours.  Anemia Panel: No results for input(s): VITAMINB12, FOLATE, FERRITIN, TIBC, IRON, RETICCTPCT in the last 72 hours. Urine analysis:    Component Value Date/Time   COLORURINE YELLOW 06/29/2022 1821   APPEARANCEUR CLOUDY (A) 06/29/2022 1821   LABSPEC 1.029 06/29/2022 1821   PHURINE 6.0 06/29/2022 1821   GLUCOSEU >=500 (A) 06/29/2022 1821   HGBUR MODERATE (A) 06/29/2022 1821   BILIRUBINUR NEGATIVE 06/29/2022 1821   BILIRUBINUR moderate 07/09/2014 1428   KETONESUR 20 (A) 06/29/2022 1821   PROTEINUR 30 (A) 06/29/2022 1821   UROBILINOGEN 0.2 08/07/2015 0854   NITRITE NEGATIVE 06/29/2022 1821   LEUKOCYTESUR LARGE (A) 06/29/2022 1821   Sepsis Labs: Invalid input(s): PROCALCITONIN, LACTICIDVEN  Microbiology: Recent Results (from the past 240 hours)  MRSA Next Gen by PCR, Nasal     Status: None   Collection Time: 07/18/24 11:54 AM   Specimen: Nasal Mucosa; Nasal Swab  Result Value Ref Range Status   MRSA by PCR Next Gen NOT DETECTED NOT DETECTED Final    Comment: (NOTE) The GeneXpert MRSA Assay (FDA approved for NASAL specimens only), is one component of a comprehensive MRSA colonization surveillance program. It is not intended to diagnose MRSA infection nor to guide or monitor treatment for MRSA infections. Test performance is not FDA approved in patients less than 43  years old. Performed at [facility], [address], [place], [place] [address]     Radiology Studies: No results found.     Kalayah Leske T. Giliana Vantil [facility]  If 7PM-7AM, please contact night-coverage [email or web address] 07/26/2024, 3:41 PM

## 2024-07-27 DIAGNOSIS — Z8673 Personal history of transient ischemic attack (TIA), and cerebral infarction without residual deficits: Secondary | ICD-10-CM | POA: Diagnosis not present

## 2024-07-27 DIAGNOSIS — I10 Essential (primary) hypertension: Secondary | ICD-10-CM | POA: Diagnosis not present

## 2024-07-27 DIAGNOSIS — E1159 Type 2 diabetes mellitus with other circulatory complications: Secondary | ICD-10-CM | POA: Diagnosis not present

## 2024-07-27 DIAGNOSIS — I998 Other disorder of circulatory system: Secondary | ICD-10-CM | POA: Diagnosis not present

## 2024-07-27 LAB — RENAL FUNCTION PANEL
Albumin: 1.6 g/dL — ABNORMAL LOW (ref 3.5–5.0)
Anion gap: 5 (ref 5–15)
BUN: 25 mg/dL — ABNORMAL HIGH (ref 8–23)
CO2: 27 mmol/L (ref 22–32)
Calcium: 8.5 mg/dL — ABNORMAL LOW (ref 8.9–10.3)
Chloride: 100 mmol/L (ref 98–111)
Creatinine, Ser: 1.01 mg/dL (ref 0.61–1.24)
GFR, Estimated: >60 mL/min (ref >60)
Glucose, Bld: 105 mg/dL — ABNORMAL HIGH (ref 70–99)
Phosphorus: 2.4 mg/dL — ABNORMAL LOW (ref 2.5–4.6)
Potassium: 4.2 mmol/L (ref 3.5–5.1)
Sodium: 132 mmol/L — ABNORMAL LOW (ref 135–145)

## 2024-07-27 LAB — CBC
HCT: 34.2 % — ABNORMAL LOW (ref 39.0–52.0)
Hemoglobin: 10.6 g/dL — ABNORMAL LOW (ref 13.0–17.0)
MCH: 24.8 pg — ABNORMAL LOW (ref 26.0–34.0)
MCHC: 31 g/dL (ref 30.0–36.0)
MCV: 80.1 fL (ref 80.0–100.0)
Platelets: 311 K/uL (ref 150–400)
RBC: 4.27 MIL/uL (ref 4.22–5.81)
RDW: 15.9 % — ABNORMAL HIGH (ref 11.5–15.5)
WBC: 11.6 K/uL — ABNORMAL HIGH (ref 4.0–10.5)
nRBC: 0 % (ref 0.0–0.2)

## 2024-07-27 LAB — MAGNESIUM: Magnesium: 1.8 mg/dL (ref 1.7–2.4)

## 2024-07-27 LAB — GLUCOSE, CAPILLARY
Glucose-Capillary: 110 mg/dL — ABNORMAL HIGH (ref 70–99)
Glucose-Capillary: 133 mg/dL — ABNORMAL HIGH (ref 70–99)
Glucose-Capillary: 206 mg/dL — ABNORMAL HIGH (ref 70–99)
Glucose-Capillary: 147 mg/dL — ABNORMAL HIGH (ref 70–99)

## 2024-07-27 MED ORDER — SENNOSIDES-DOCUSATE SODIUM 8.6-50 MG PO TABS
2.0000 | ORAL_TABLET | Freq: Two times a day (BID) | ORAL | Status: DC
  Administered 2024-07-27 – 2024-07-31 (×8): 2 via ORAL
  Filled 2024-07-27 (×9): qty 2

## 2024-07-27 MED ORDER — POLYETHYLENE GLYCOL 3350 17 G PO PACK: 17.0000 g | PACK | Freq: Two times a day (BID) | ORAL | Status: DC | PRN

## 2024-07-27 MED ORDER — POLYETHYLENE GLYCOL 3350 17 G PO PACK
17.0000 g | PACK | Freq: Two times a day (BID) | ORAL | Status: AC
  Administered 2024-07-27 (×2): 17 g via ORAL
  Filled 2024-07-27 (×2): qty 1

## 2024-07-27 NOTE — Progress Notes (Signed)
 PROGRESS NOTE  Karl [name] FMW:[id number] DOB: 1956-02-03   PCP: Geofm Glade PARAS, MD  Patient is from: Home.  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 Usually ambulates with the help of walker and last 2 weeks he has been using the wheelchair.  CT angiogram of lower extremity with poor visualization of the arteries.  Vascular surgery consulted.  Patient was started on IV heparin  and ceftriaxone .    Patient underwent aortogram and lower extremity angiogram that showed sluggish flow in right lower extremity.  Patient underwent right AKA by Dr. Serene on 8/20.  Therapy recommended CIR.  CIR following.    Subjective: Seen and examined earlier this morning.  No major events overnight or this morning.  No complaints.  Denies pain.  He stated that he has not a bowel movement yet.  Per chart review, LBM 8/21 on 8/23 and 8/14 on 8/24  Objective: Vitals:   07/27/24 0040 07/27/24 0329 07/27/24 0803 07/27/24 0931  BP: 113/78 115/72 121/68 121/68  Pulse:  81 78 78  Resp: 16 17 18    Temp:  99.2 F (37.3 C) 98.5 F (36.9 C)   TempSrc:  Oral Oral   SpO2: 98% 99% 99%   Weight:      Height:        Examination:  GENERAL: No apparent distress.  Appears frail. HEENT: MMM.  Vision and hearing grossly intact.  NECK: Supple.  No apparent JVD.  RESP:  No IWOB.  Fair aeration bilaterally. CVS: HR in 90s.  Heart sounds normal.  ABD/GI/GU: BS+. Abd soft, NTND.  MSK/EXT: Right AKA.  Ace wrap and dressing DCI. SKIN: As above. NEURO: AA.  Oriented appropriately.  No apparent focal neuro  deficit. PSYCH: Calm. Normal affect.   Consultants:  Vascular surgery  Procedures: 8/18-aortogram and lower extremity arteriogram with sluggish flow in right lower extremity 8/20-right AKA  Microbiology summarized: MRSA PCR screen nonreactive.  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  CT angio with poor visualization of arteries.  Arteriogram with sluggish flow in RLE.  CRP elevated to 16.  Leukocytosis and CK improved after AKA.  -S/p right AKA on 8/20 -Cefepime  8/15>> ceftriaxone  8/17-8/21 -Pain control and bowel regimen -Therapy recommended CIR.  Chronic systolic CHF/ICM: TTE in 06/2022 with LVEF of 20 to 25%, GH, G1-DD.  Appears euvolemic on exam.  No respiratory distress.  Not on diuretics at home. -Continue home Jardiance . -Hold home losartan  given soft BP. -Decrease metoprolol  -Closely monitor respiratory and fluid status.  History of CAD/chronic LBBB: No cardiopulmonary symptoms. - Medication as above.  Hx of embolic CVA w/ L sided weakness-on Eliquis  at home. - Continue home Eliquis . - Continue holding Crestor  given elevated CK  NIDDM-2 with hyperglycemia: A1c 5.9%. Recent Labs  Lab 07/26/24 0827 07/26/24 1212 07/26/24 1732 07/26/24 2106 07/27/24 0800  GLUCAP 129* 138* 124* 96 110*  - Continue SSI-sensitive.  Sinus tachycardia with PACs: Resolved. - Continue metoprolol  - Continue telemetry  Hyperlipidemia -Hold Crestor  in the setting of  myonecrosis.  Myonecrosis: Likely due to critical limb ischemia.  Improved - Continue holding Crestor  - Recheck CK in the morning  Constipation: No nausea, vomiting or tenderness. - Scheduled Senokot and MiraLAX   Leukocytosis: Likely demargination.  Doubt infection - Continue monitoring  Severe malnutrition Body mass index is 19.26 kg/m. Nutrition Problem: Severe Malnutrition Etiology: chronic  illness, decreased appetite Signs/Symptoms: severe muscle depletion, severe fat depletion Interventions: Refer to RD note for recommendations   DVT prophylaxis:  SCD's Start: 07/23/24 1648 apixaban  (ELIQUIS ) tablet 5 mg  Code Status: Full code Family Communication: None at bedside. Level of care: Progressive Cardiac Status is: Inpatient Remains inpatient appropriate because: Chronic limb threatening ischemia   Final disposition: CIR   35 minutes with more than 50% spent in reviewing records, counseling patient/family and coordinating care.   Sch Meds:  Scheduled Meds:  (feeding supplement) PROSource Plus  30 mL Oral BID BM   apixaban   5 mg Oral BID   aspirin  EC  81 mg Oral Daily   docusate sodium   100 mg Oral Daily   feeding supplement  237 mL Oral TID BM   insulin  aspart  0-9 Units Subcutaneous TID WC   metoprolol  tartrate  12.5 mg Oral BID   multivitamin with minerals  1 tablet Oral Daily   nutrition supplement (JUVEN)  1 packet Oral BID BM   polyethylene glycol  17 g Oral BID   senna-docusate  2 tablet Oral BID   sodium chloride  flush  3 mL Intravenous Q12H   Continuous Infusions:   PRN Meds:.acetaminophen , hydrALAZINE , HYDROcodone -acetaminophen , HYDROcodone -acetaminophen , metoprolol  tartrate, morphine  injection, ondansetron  (ZOFRAN ) IV, phenol, polyethylene glycol **FOLLOWED BY** [START ON 07/28/2024] polyethylene glycol, potassium chloride , sodium chloride  flush  Antimicrobials: Anti-infectives (From admission, onward)    Start     Dose/Rate Route Frequency Ordered Stop   07/22/24 1700  cefTRIAXone  (ROCEPHIN ) 2 g in sodium chloride  0.9 % 100 mL IVPB  Status:  Discontinued        2 g 200 mL/hr over 30 Minutes Intravenous Every 24 hours 07/22/24 0945 07/24/24 1052   07/20/24 1030  cefTRIAXone  (ROCEPHIN ) 2 g in sodium chloride  0.9 % 100 mL IVPB  Status:  Discontinued        2 g 200 mL/hr over 30 Minutes Intravenous Every 24 hours 07/20/24 0939 07/22/24 0945    07/18/24 1400  ceFEPIme  (MAXIPIME ) 2 g in sodium chloride  0.9 % 100 mL IVPB  Status:  Discontinued        2 g 200 mL/hr over 30 Minutes Intravenous Every 12 hours 07/18/24 1258 07/20/24 0938   07/18/24 1245  ceFEPIme  (MAXIPIME ) 2 g in sodium chloride  0.9 % 100 mL IVPB  Status:  Discontinued        2 g 200 mL/hr over 30 Minutes Intravenous Every 8 hours 07/18/24 1153 07/18/24 1258        I have personally reviewed the following labs and images: CBC: Recent Labs  Lab 07/23/24 0422 07/24/24 0439 07/25/24 1301 07/26/24 0905 07/27/24 0513  WBC 11.8* 9.4 13.8* 15.1* 11.6*  HGB 11.7* 11.0* 12.8* 11.1* 10.6*  HCT 36.5* 34.9* 41.1 36.1* 34.2*  MCV 78.7* 79.1* 79.2* 80.8 80.1  PLT 352 304 421* 361 311   BMP &GFR Recent Labs  Lab 07/22/24 0437 07/23/24 0422 07/24/24 0439 07/25/24 0300 07/26/24 0715 07/27/24 0513  NA 132* 132* 131* 132* 133* 132*  K 4.6 3.8 4.5 4.5 4.3 4.2  CL 95* 100 97* 98 101 100  CO2 25 23 25  27  26 27  GLUCOSE 109* 129* 148* 176* 131* 105*  BUN 14 14 13  26* 31* 25*  CREATININE 1.07 1.03 0.97 1.17 1.07 1.01  CALCIUM  9.3 8.7* 9.0 9.0 8.7* 8.5*  MG 2.1 2.0 2.0 2.2 2.2 1.8  PHOS 2.4* 2.4*  --  1.5* 2.8 2.4*   Estimated Creatinine Clearance: 66.4 mL/min (by C-G formula based on SCr of 1.01 mg/dL). Liver & Pancreas: Recent Labs  Lab 07/21/24 0536 07/22/24 0437 07/23/24 0422 07/24/24 0439 07/25/24 0300 07/26/24 0715 07/27/24 0513  AST 64* 73* 64* 41  --   --   --   ALT 23 25 24 19   --   --   --   ALKPHOS 63 66 60 50  --   --   --   BILITOT 0.6 0.5 0.7 0.4  --   --   --   PROT 7.4 7.3 6.8 6.3*  --   --   --   ALBUMIN  2.1* 2.2* 2.1* 2.0* 2.2* 1.9* 1.6*   No results for input(s): LIPASE, AMYLASE in the last 168 hours. No results for input(s): AMMONIA in the last 168 hours. Diabetic: No results for input(s): HGBA1C in the last 72 hours. Recent Labs  Lab 07/26/24 0827 07/26/24 1212 07/26/24 1732 07/26/24 2106 07/27/24 0800  GLUCAP 129*  138* 124* 96 110*   Cardiac Enzymes: Recent Labs  Lab 07/23/24 0422 07/24/24 0439 07/25/24 0300 07/26/24 0715  CKTOTAL 1,938* 696* 1,150* 1,149*   No results for input(s): PROBNP in the last 8760 hours. Coagulation Profile: Recent Labs  Lab 07/21/24 0536  INR 1.3*   Thyroid  Function Tests: No results for input(s): TSH, T4TOTAL, FREET4, T3FREE, THYROIDAB in the last 72 hours. Lipid Profile: No results for input(s): CHOL, HDL, LDLCALC, TRIG, CHOLHDL, LDLDIRECT in the last 72 hours.  Anemia Panel: No results for input(s): VITAMINB12, FOLATE, FERRITIN, TIBC, IRON, RETICCTPCT in the last 72 hours. Urine analysis:    Component Value Date/Time   COLORURINE YELLOW 06/29/2022 1821   APPEARANCEUR CLOUDY (A) 06/29/2022 1821   LABSPEC 1.029 06/29/2022 1821   PHURINE 6.0 06/29/2022 1821   GLUCOSEU >=500 (A) 06/29/2022 1821   HGBUR MODERATE (A) 06/29/2022 1821   BILIRUBINUR NEGATIVE 06/29/2022 1821   BILIRUBINUR moderate 07/09/2014 1428   KETONESUR 20 (A) 06/29/2022 1821   PROTEINUR 30 (A) 06/29/2022 1821   UROBILINOGEN 0.2 08/07/2015 0854   NITRITE NEGATIVE 06/29/2022 1821   LEUKOCYTESUR LARGE (A) 06/29/2022 1821   Sepsis Labs: Invalid input(s): PROCALCITONIN, LACTICIDVEN  Microbiology: Recent Results (from the past 240 hours)  MRSA Next Gen by PCR, Nasal     Status: None   Collection Time: 07/18/24 11:54 AM   Specimen: Nasal Mucosa; Nasal Swab  Result Value Ref Range Status   MRSA by PCR Next Gen NOT DETECTED NOT DETECTED Final    Comment: (NOTE) The GeneXpert MRSA Assay (FDA approved for NASAL specimens only), is one component of a comprehensive MRSA colonization surveillance program. It is not intended to diagnose MRSA infection nor to guide or monitor treatment for MRSA infections. Test performance is not FDA approved in patients less than 90 years old. Performed at [facility], [address] [place],  [place] [address]     Radiology Studies: No results found.     Jacqulynn Shappell T. Cindie Rajagopalan [facility]  If 7PM-7AM, please contact night-coverage [email or web address] 07/27/2024, 9:52 AM

## 2024-07-28 DIAGNOSIS — I10 Essential (primary) hypertension: Secondary | ICD-10-CM | POA: Diagnosis not present

## 2024-07-28 DIAGNOSIS — Z8673 Personal history of transient ischemic attack (TIA), and cerebral infarction without residual deficits: Secondary | ICD-10-CM | POA: Diagnosis not present

## 2024-07-28 DIAGNOSIS — E1159 Type 2 diabetes mellitus with other circulatory complications: Secondary | ICD-10-CM | POA: Diagnosis not present

## 2024-07-28 DIAGNOSIS — I998 Other disorder of circulatory system: Secondary | ICD-10-CM | POA: Diagnosis not present

## 2024-07-28 LAB — GLUCOSE, CAPILLARY
Glucose-Capillary: 140 mg/dL — ABNORMAL HIGH (ref 70–99)
Glucose-Capillary: 183 mg/dL — ABNORMAL HIGH (ref 70–99)
Glucose-Capillary: 156 mg/dL — ABNORMAL HIGH (ref 70–99)
Glucose-Capillary: 104 mg/dL — ABNORMAL HIGH (ref 70–99)

## 2024-07-28 NOTE — Progress Notes (Signed)
 PROGRESS NOTE  Karl [name] FMW:[id number] DOB: Nov 23, 1956   PCP: Geofm Glade PARAS, MD  Patient is from: Home.  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 Usually ambulates with the help of walker and last 2 weeks he has been using the wheelchair.  CT angiogram of lower extremity with poor visualization of the arteries.  Vascular surgery consulted.  Patient was started on IV heparin  and ceftriaxone .    Patient underwent aortogram and lower extremity angiogram that showed sluggish flow in right lower extremity.  Patient underwent right AKA by Dr. Serene on 8/20.  Therapy recommended CIR.  CIR following.    Subjective: Seen and examined earlier this morning.  No major events overnight or this morning.  No complaints.  No pain.  Still has not had bowel movement but passing gas.  Denies nausea, vomiting abdominal pain.  Objective: Vitals:   07/27/24 2229 07/27/24 2344 07/28/24 0440 07/28/24 0748  BP: 111/77 130/80 122/69 105/73  Pulse: 85 80 76 81  Resp:  18 18 18   Temp:  98.2 F (36.8 C) 98.1 F (36.7 C) 97.7 F (36.5 C)  TempSrc:  Oral Oral Oral  SpO2: 98% 98% 97% 98%  Weight:      Height:        Examination:  GENERAL: No apparent distress.  Appears frail. HEENT: MMM.  Vision and hearing grossly intact.  NECK: Supple.  No apparent JVD.  RESP:  No IWOB.  Fair aeration bilaterally. CVS: HR in 90s.  Heart sounds normal.  ABD/GI/GU: BS+. Abd soft, NTND.  MSK/EXT: Right AKA.  Ace wrap and dressing DCI. SKIN: As above. NEURO: AA.  Oriented appropriately.  No apparent  focal neuro deficit. PSYCH: Calm. Normal affect.   Consultants:  Vascular surgery  Procedures: 8/18-aortogram and lower extremity arteriogram with sluggish flow in right lower extremity 8/20-right AKA  Microbiology summarized: MRSA PCR screen nonreactive.  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  CT angio with poor visualization of arteries.  Arteriogram with sluggish flow in RLE.  CRP elevated to 16.  Leukocytosis and CK improved after AKA.  -S/p right AKA on 8/20 -Cefepime  8/15>> ceftriaxone  8/17-8/21 -Pain control and bowel regimen -Therapy recommended CIR.  Chronic systolic CHF/ICM: TTE in 06/2022 with LVEF of 20 to 25%, GH, G1-DD.  Appears euvolemic on exam.  No respiratory distress.  Not on diuretics at home. -Continue home Jardiance . -Hold home losartan  given soft BP. -Decrease metoprolol  -Closely monitor respiratory and fluid status.  History of CAD/chronic LBBB: No cardiopulmonary symptoms. - Medication as above.  Hx of embolic CVA w/ L sided weakness-on Eliquis  at home. - Continue home Eliquis . - Continue holding Crestor  given elevated CK  NIDDM-2 with hyperglycemia: A1c 5.9%. Recent Labs  Lab 07/27/24 0800 07/27/24 1223 07/27/24 1616 07/27/24 2043 07/28/24 0749  GLUCAP 110* 133* 206* 147* 140*  - Continue SSI-sensitive.  Sinus tachycardia with PACs: Resolved. - Continue metoprolol  - Continue telemetry  Hyperlipidemia -Hold Crestor  in the setting of myonecrosis.  Myonecrosis: Likely  due to critical limb ischemia.  Improved - Continue holding Crestor  - Recheck CK in the morning  Constipation: No nausea, vomiting or tenderness. - Scheduled Senokot and MiraLAX   Leukocytosis: Likely demargination.  Doubt infection - Continue monitoring  Severe malnutrition Body mass index is 19.26 kg/m. Nutrition Problem: Severe  Malnutrition Etiology: chronic illness, decreased appetite Signs/Symptoms: severe muscle depletion, severe fat depletion Interventions: Refer to RD note for recommendations   DVT prophylaxis:  SCD's Start: 07/23/24 1648 apixaban  (ELIQUIS ) tablet 5 mg  Code Status: Full code Family Communication: None at bedside. Level of care: Progressive Cardiac Status is: Inpatient Remains inpatient appropriate because: Chronic limb threatening ischemia   Final disposition: CIR   35 minutes with more than 50% spent in reviewing records, counseling patient/family and coordinating care.   Sch Meds:  Scheduled Meds:  (feeding supplement) PROSource Plus  30 mL Oral BID BM   apixaban   5 mg Oral BID   aspirin  EC  81 mg Oral Daily   docusate sodium   100 mg Oral Daily   feeding supplement  237 mL Oral TID BM   insulin  aspart  0-9 Units Subcutaneous TID WC   metoprolol  tartrate  12.5 mg Oral BID   multivitamin with minerals  1 tablet Oral Daily   nutrition supplement (JUVEN)  1 packet Oral BID BM   senna-docusate  2 tablet Oral BID   sodium chloride  flush  3 mL Intravenous Q12H   Continuous Infusions:   PRN Meds:.acetaminophen , hydrALAZINE , HYDROcodone -acetaminophen , HYDROcodone -acetaminophen , metoprolol  tartrate, morphine  injection, ondansetron  (ZOFRAN ) IV, phenol, [COMPLETED] polyethylene glycol **FOLLOWED BY** polyethylene glycol, potassium chloride , sodium chloride  flush  Antimicrobials: Anti-infectives (From admission, onward)    Start     Dose/Rate Route Frequency Ordered Stop   07/22/24 1700  cefTRIAXone  (ROCEPHIN ) 2 g in sodium chloride  0.9 % 100 mL IVPB  Status:  Discontinued        2 g 200 mL/hr over 30 Minutes Intravenous Every 24 hours 07/22/24 0945 07/24/24 1052   07/20/24 1030  cefTRIAXone  (ROCEPHIN ) 2 g in sodium chloride  0.9 % 100 mL IVPB  Status:  Discontinued        2 g 200 mL/hr over 30 Minutes Intravenous Every 24 hours 07/20/24 0939 07/22/24 0945   07/18/24 1400   ceFEPIme  (MAXIPIME ) 2 g in sodium chloride  0.9 % 100 mL IVPB  Status:  Discontinued        2 g 200 mL/hr over 30 Minutes Intravenous Every 12 hours 07/18/24 1258 07/20/24 0938   07/18/24 1245  ceFEPIme  (MAXIPIME ) 2 g in sodium chloride  0.9 % 100 mL IVPB  Status:  Discontinued        2 g 200 mL/hr over 30 Minutes Intravenous Every 8 hours 07/18/24 1153 07/18/24 1258        I have personally reviewed the following labs and images: CBC: Recent Labs  Lab 07/23/24 0422 07/24/24 0439 07/25/24 1301 07/26/24 0905 07/27/24 0513  WBC 11.8* 9.4 13.8* 15.1* 11.6*  HGB 11.7* 11.0* 12.8* 11.1* 10.6*  HCT 36.5* 34.9* 41.1 36.1* 34.2*  MCV 78.7* 79.1* 79.2* 80.8 80.1  PLT 352 304 421* 361 311   BMP &GFR Recent Labs  Lab 07/22/24 0437 07/23/24 0422 07/24/24 0439 07/25/24 0300 07/26/24 0715 07/27/24 0513  NA 132* 132* 131* 132* 133* 132*  K 4.6 3.8 4.5 4.5 4.3 4.2  CL 95* 100 97* 98 101 100  CO2 25 23 25 27 26 27   GLUCOSE 109* 129* 148* 176* 131* 105*  BUN 14 14 13  26* 31* 25*  CREATININE 1.07 1.03 0.97 1.17 1.07 1.01  CALCIUM  9.3 8.7* 9.0 9.0 8.7* 8.5*  MG 2.1 2.0 2.0 2.2 2.2 1.8  PHOS 2.4* 2.4*  --  1.5* 2.8 2.4*   Estimated Creatinine Clearance: 66.4 mL/min (by C-G formula based on SCr of 1.01 mg/dL). Liver & Pancreas: Recent Labs  Lab 07/22/24 0437 07/23/24 0422 07/24/24 0439 07/25/24 0300 07/26/24 0715 07/27/24 0513  AST 73* 64* 41  --   --   --   ALT 25 24 19   --   --   --   ALKPHOS 66 60 50  --   --   --   BILITOT 0.5 0.7 0.4  --   --   --   PROT 7.3 6.8 6.3*  --   --   --   ALBUMIN  2.2* 2.1* 2.0* 2.2* 1.9* 1.6*   No results for input(s): LIPASE, AMYLASE in the last 168 hours. No results for input(s): AMMONIA in the last 168 hours. Diabetic: No results for input(s): HGBA1C in the last 72 hours. Recent Labs  Lab 07/27/24 0800 07/27/24 1223 07/27/24 1616 07/27/24 2043 07/28/24 0749  GLUCAP 110* 133* 206* 147* 140*   Cardiac Enzymes: Recent Labs   Lab 07/23/24 0422 07/24/24 0439 07/25/24 0300 07/26/24 0715  CKTOTAL 1,938* 696* 1,150* 1,149*   No results for input(s): PROBNP in the last 8760 hours. Coagulation Profile: No results for input(s): INR, PROTIME in the last 168 hours.  Thyroid  Function Tests: No results for input(s): TSH, T4TOTAL, FREET4, T3FREE, THYROIDAB in the last 72 hours. Lipid Profile: No results for input(s): CHOL, HDL, LDLCALC, TRIG, CHOLHDL, LDLDIRECT in the last 72 hours.  Anemia Panel: No results for input(s): VITAMINB12, FOLATE, FERRITIN, TIBC, IRON, RETICCTPCT in the last 72 hours. Urine analysis:    Component Value Date/Time   COLORURINE YELLOW 06/29/2022 1821   APPEARANCEUR CLOUDY (A) 06/29/2022 1821   LABSPEC 1.029 06/29/2022 1821   PHURINE 6.0 06/29/2022 1821   GLUCOSEU >=500 (A) 06/29/2022 1821   HGBUR MODERATE (A) 06/29/2022 1821   BILIRUBINUR NEGATIVE 06/29/2022 1821   BILIRUBINUR moderate 07/09/2014 1428   KETONESUR 20 (A) 06/29/2022 1821   PROTEINUR 30 (A) 06/29/2022 1821   UROBILINOGEN 0.2 08/07/2015 0854   NITRITE NEGATIVE 06/29/2022 1821   LEUKOCYTESUR LARGE (A) 06/29/2022 1821   Sepsis Labs: Invalid input(s): PROCALCITONIN, LACTICIDVEN  Microbiology: No results found for this or any previous visit (from the past 240 hours).   Radiology Studies: No results found.     Karl [name] T. Mckaila Duffus [facility]  If 7PM-7AM, please contact night-coverage [email or web address] 07/28/2024, 12:05 PM

## 2024-07-28 NOTE — Progress Notes (Signed)
  Progress Note    07/28/2024 6:42 AM 5 Days Post-Op  Subjective:  says he has to go to the bathroom  Afebrile  Vitals:   07/27/24 2344 07/28/24 0440  BP: 130/80 122/69  Pulse: 80 76  Resp: 18 18  Temp: 98.2 F (36.8 C) 98.1 F (36.7 C)  SpO2: 98% 97%    Physical Exam: Incisions:  stump sock in place right AKA; clean and dry   CBC    Component Value Date/Time   WBC 11.6 (H) 07/27/2024 0513   RBC 4.27 07/27/2024 0513   HGB 10.6 (L) 07/27/2024 0513   HCT 34.2 (L) 07/27/2024 0513   PLT 311 07/27/2024 0513   MCV 80.1 07/27/2024 0513   MCH 24.8 (L) 07/27/2024 0513   MCHC 31.0 07/27/2024 0513   RDW 15.9 (H) 07/27/2024 0513   LYMPHSABS 1.3 07/18/2024 0817   MONOABS 1.7 (H) 07/18/2024 0817   EOSABS 0.0 07/18/2024 0817   BASOSABS 0.0 07/18/2024 0817    BMET    Component Value Date/Time   NA 132 (L) 07/27/2024 0513   NA 135 12/25/2017 1143   K 4.2 07/27/2024 0513   CL 100 07/27/2024 0513   CO2 27 07/27/2024 0513   GLUCOSE 105 (H) 07/27/2024 0513   BUN 25 (H) 07/27/2024 0513   BUN 15 12/25/2017 1143   CREATININE 1.01 07/27/2024 0513   CREATININE 1.69 (H) 07/06/2020 1009   CALCIUM  8.5 (L) 07/27/2024 0513   GFRNONAA >60 07/27/2024 0513   GFRNONAA 42 (L) 07/06/2020 1009   GFRAA 48 (L) 07/06/2020 1009    INR    Component Value Date/Time   INR 1.3 (H) 07/21/2024 0536   INR 8.0 05/22/2024 1112     Intake/Output Summary (Last 24 hours) at 07/28/2024 0642 Last data filed at 07/28/2024 0555 Gross per 24 hour  Intake --  Output 1150 ml  Net -1150 ml     Assessment/Plan:  68 y.o. male is s/p right above knee amputation   5 Days Post-Op   -pt requesting to go to the bathroom.  Will plan to check AKA tomorrow. -our office will arrange f/u for 4-6 weeks for staples removal   Lucie Apt, PA-C [facility] [phone] 07/28/2024 6:42 AM

## 2024-07-28 NOTE — Plan of Care (Signed)
   Problem: Safety: Goal: Ability to remain free from injury will improve Outcome: Progressing   Problem: Skin Integrity: Goal: Risk for impaired skin integrity will decrease Outcome: Progressing

## 2024-07-28 NOTE — Progress Notes (Signed)
 Inpatient Rehab Admissions Coordinator:  ?Insurance authorization started. Will continue to follow. ? ? ?Tinnie Yvone Cohens, MS, CCC-SLP ?Admissions Coordinator ?[phone] ? ?

## 2024-07-28 NOTE — Progress Notes (Signed)
 .Physical Therapy Treatment Patient Details Name: Karl [name] MRN: [id number] DOB: [birth year] Today's Date: 07/28/2024   History of Present Illness 68 yo male adm 07/17/24 with RLE ischemia s/p Rt AKA 8/20. PMhx:ICM, LBBB, HLD, BPH, CAD, CVA with Lt weakness, CKD, T2DM, CHF, HTN    PT Comments  Pt pleasant, in chair on arrival and able to work toward standing from low surface. Pt educated for wound checks, desensitization, HEP, transfers and progression. Pt encouraged to continue LB and chair pushups. Will continue to follow. Patient will benefit from intensive inpatient follow-up therapy, >3 hours/day     If plan is discharge home, recommend the following: A little help with walking and/or transfers;A little help with bathing/dressing/bathroom;Assistance with cooking/housework;Assist for transportation   Can travel by private vehicle        Equipment Recommendations  Other (comment) (tub bench)    Recommendations for Other Services       Precautions / Restrictions Precautions Precautions: Fall;Other (comment) Recall of Precautions/Restrictions: Impaired Precaution/Restrictions Comments: R AKA     Mobility  Bed Mobility               General bed mobility comments: in chair on arrival    Transfers Overall transfer level: Needs assistance   Transfers: Sit to/from Stand Sit to Stand: Max assist           General transfer comment: max assist to rise to standing with RW present, left knee blocked, mod cues and increased time to transition hands from armrests to RW as well as to extend hips. Mod cues throughout standing with continued min assist in standing for balance. stood 30 sec    Ambulation/Gait               General Gait Details: unable   Optometrist     Tilt Bed    Modified Rankin (Stroke Patients Only)       Balance Overall balance assessment: Needs assistance Sitting-balance support: Feet supported,  No upper extremity supported Sitting balance-Leahy Scale: Fair     Standing balance support: Bilateral upper extremity supported Standing balance-Leahy Scale: Poor Standing balance comment: RW and min assist in standing                            Communication Communication Communication: No apparent difficulties  Cognition Arousal: Alert Behavior During Therapy: Flat affect   PT - Cognitive impairments: No apparent impairments                         Following commands: Intact      Cueing Cueing Techniques: Verbal cues, Gestural cues  Exercises General Exercises - Upper Extremity Chair Push Up: AROM, Both, 10 reps, Seated General Exercises - Lower Extremity Long Arc Quad: AROM, Left, 20 reps, Seated Amputee Exercises Hip Extension: Right, 10 reps, Standing, AROM Hip ABduction/ADduction: AROM, Right, Supine, 20 reps    General Comments        Pertinent Vitals/Pain Pain Assessment Pain Assessment: No/denies pain    Home Living                          Prior Function            PT Goals (current goals can now be found in the care plan section) Progress towards PT goals:  Progressing toward goals    Frequency    Min 3X/week      PT Plan      Co-evaluation              AM-PAC PT 6 Clicks Mobility   Outcome Measure  Help needed turning from your back to your side while in a flat bed without using bedrails?: A Little Help needed moving from lying on your back to sitting on the side of a flat bed without using bedrails?: A Little Help needed moving to and from a bed to a chair (including a wheelchair)?: A Little Help needed standing up from a chair using your arms (e.g., wheelchair or bedside chair)?: A Lot Help needed to walk in hospital room?: Total Help needed climbing 3-5 steps with a railing? : Total 6 Click Score: 13    End of Session Equipment Utilized During Treatment: Gait belt Activity Tolerance:  Patient tolerated treatment well Patient left: in chair;with call bell/phone within reach;with chair alarm set Nurse Communication: Mobility status PT Visit Diagnosis: Other abnormalities of gait and mobility (R26.89);Muscle weakness (generalized) (M62.81);Difficulty in walking, not elsewhere classified (R26.2)     Time: 1001-1020 PT Time Calculation (min) (ACUTE ONLY): 19 min  Charges:    $Therapeutic Activity: 8-22 mins PT General Charges $$ ACUTE PT VISIT: 1 Visit                     [name], PT Acute Rehabilitation Services Office: [phone]    Shaheen Star B Hassaan Crite 07/28/2024, 11:11 AM

## 2024-07-28 NOTE — Progress Notes (Signed)
 Occupational Therapy Treatment Patient Details Name: Karl [name] MRN: [id number] DOB: 04-22-1956 Today's Date: 07/28/2024   History of present illness 68 yo male adm 07/17/24 with RLE ischemia s/p Rt AKA 8/20. PMhx:ICM, LBBB, HLD, BPH, CAD, CVA with Lt weakness, CKD, T2DM, CHF, HTN   OT comments  Pt presented in bed and agreed to work on [employer]. Pt completed lateral scooting from bed to chair with CGA with mod cues for hand positioning. He then completed UE bathing and dressing post set up and mod-max assist for peri care as pt leaning side to side and then reverse push up. Patient will benefit from intensive inpatient follow-up therapy, >3 hours/day.       If plan is discharge home, recommend the following:  A lot of help with walking and/or transfers;A lot of help with bathing/dressing/bathroom;Assist for transportation   Equipment Recommendations  BSC/3in1 (drop arm)    Recommendations for Other Services Rehab consult    Precautions / Restrictions Precautions Precautions: Fall;Other (comment) Recall of Precautions/Restrictions: Impaired Precaution/Restrictions Comments: R AKA Restrictions Weight Bearing Restrictions Per Provider Order: No RLE Weight Bearing Per Provider Order: Non weight bearing       Mobility Bed Mobility Overal bed mobility: Needs Assistance Bed Mobility: Supine to Sit     Supine to sit: Used rails, Contact guard, Min assist     General bed mobility comments: increase in time and min assist to assist to get hips to EOB with pad    Transfers Overall transfer level: Needs assistance   Transfers: Sit to/from Stand Sit to Stand: Min assist          Lateral/Scoot Transfers: Contact guard assist General transfer comment: pt wanted to work on transfering with lateral scoot to chair with CGA to completed and mod cues on hand positioning but to complete sit to stand transfer to clear sacrum from chair needed min assist but did not go into a full stand  position     Balance Overall balance assessment: Needs assistance Sitting-balance support: Feet supported Sitting balance-Leahy Scale: Fair Sitting balance - Comments: lean intially but as sitting increase in balance   Standing balance support: Bilateral upper extremity supported Standing balance-Leahy Scale: Zero Standing balance comment: pt dependent on BUE support                           ADL either performed or assessed with clinical judgement   ADL Overall ADL's : Needs assistance/impaired Eating/Feeding: Independent;Sitting   Grooming: Wash/dry face;Set up;Sitting   Upper Body Bathing: Set up;Sitting   Lower Body Bathing: Moderate assistance;Maximal assistance Lower Body Bathing Details (indicate cue type and reason): pt was  able to complete some with lateral lean side to side but with throughness needed max assist with pt completion of reverse ppushup Upper Body Dressing : Set up;Sitting   Lower Body Dressing: Moderate assistance;Sitting/lateral leans;Sit to/from stand               Functional mobility during ADLs: Contact guard assist;Minimal assistance General ADL Comments: latreal scooting    Extremity/Trunk Assessment Upper Extremity Assessment Upper Extremity Assessment: LUE deficits/detail LUE Deficits / Details: Hx of CVA L side affected. Deltoid and biceps 3+/5, intention tremors and dysmetria LUE Sensation: decreased light touch LUE Coordination: decreased fine motor   Lower Extremity Assessment Lower Extremity Assessment: Defer to PT evaluation        Vision   Vision Assessment?: No apparent visual deficits   Perception  Praxis     Communication Communication Communication: No apparent difficulties   Cognition Arousal: Alert Behavior During Therapy: Flat affect Cognition: No apparent impairments             OT - Cognition Comments: Mild delayed processing speed but largely [facility]                 Following  commands: Intact        Cueing   Cueing Techniques: Verbal cues  Exercises      Shoulder Instructions       General Comments      Pertinent Vitals/ Pain       Pain Assessment Pain Assessment: 0-10 Pain Score: 0-No pain Pain Location: RLE Pain Intervention(s): Limited activity within patient's tolerance  Home Living                                          Prior Functioning/Environment              Frequency  Min 2X/week        Progress Toward Goals  OT Goals(current goals can now be found in the care plan section)  Progress towards OT goals: Progressing toward goals  Acute Rehab OT Goals Patient Stated Goal: to get better OT Goal Formulation: With patient Time For Goal Achievement: 08/07/24 Potential to Achieve Goals: Good ADL Goals Pt Will Perform Lower Body Dressing: sit to/from stand;sitting/lateral leans;with min assist Pt Will Transfer to Toilet: with min assist;stand pivot transfer;squat pivot transfer;with transfer board;bedside commode Pt Will Perform Toileting - Clothing Manipulation and hygiene: with min assist;sitting/lateral leans;sit to/from stand Additional ADL Goal #1: Pt will complete 10 chair pushups with CGA to increase BUE strength.  Plan      Co-evaluation                 AM-PAC OT 6 Clicks Daily Activity     Outcome Measure   Help from another person eating meals?: None Help from another person taking care of personal grooming?: A Little Help from another person toileting, which includes using toliet, bedpan, or urinal?: A Lot Help from another person bathing (including washing, rinsing, drying)?: A Lot Help from another person to put on and taking off regular upper body clothing?: A Little Help from another person to put on and taking off regular lower body clothing?: A Lot 6 Click Score: 16    End of Session Equipment Utilized During Treatment: Gait belt  OT Visit Diagnosis: Unsteadiness on feet  (R26.81);Other abnormalities of gait and mobility (R26.89);Muscle weakness (generalized) (M62.81)   Activity Tolerance Patient tolerated treatment well   Patient Left in chair;with call bell/phone within reach;with chair alarm set   Nurse Communication Mobility status        Time: 9097-9051 OT Time Calculation (min): 46 min  Charges: OT General Charges $OT Visit: 1 Visit OT Treatments $Self Care/Home Management : 38-52 mins  Warrick POUR OTR/L  Acute Rehab Services  [phone] office number   Warrick Berber 07/28/2024, 10:12 AM

## 2024-07-29 DIAGNOSIS — I10 Essential (primary) hypertension: Secondary | ICD-10-CM | POA: Diagnosis not present

## 2024-07-29 DIAGNOSIS — Z8673 Personal history of transient ischemic attack (TIA), and cerebral infarction without residual deficits: Secondary | ICD-10-CM | POA: Diagnosis not present

## 2024-07-29 DIAGNOSIS — I998 Other disorder of circulatory system: Secondary | ICD-10-CM | POA: Diagnosis not present

## 2024-07-29 DIAGNOSIS — E1159 Type 2 diabetes mellitus with other circulatory complications: Secondary | ICD-10-CM | POA: Diagnosis not present

## 2024-07-29 LAB — CBC
HCT: 35.9 % — ABNORMAL LOW (ref 39.0–52.0)
Hemoglobin: 11.2 g/dL — ABNORMAL LOW (ref 13.0–17.0)
MCH: 24.7 pg — ABNORMAL LOW (ref 26.0–34.0)
MCHC: 31.2 g/dL (ref 30.0–36.0)
MCV: 79.2 fL — ABNORMAL LOW (ref 80.0–100.0)
Platelets: 387 K/uL (ref 150–400)
RBC: 4.53 MIL/uL (ref 4.22–5.81)
RDW: 15.8 % — ABNORMAL HIGH (ref 11.5–15.5)
WBC: 7.9 K/uL (ref 4.0–10.5)
nRBC: 0 % (ref 0.0–0.2)

## 2024-07-29 LAB — COMPREHENSIVE METABOLIC PANEL WITH GFR
ALT: 33 U/L (ref 0–44)
AST: 31 U/L (ref 15–41)
Albumin: 1.8 g/dL — ABNORMAL LOW (ref 3.5–5.0)
Alkaline Phosphatase: 93 U/L (ref 38–126)
Anion gap: 6 (ref 5–15)
BUN: 25 mg/dL — ABNORMAL HIGH (ref 8–23)
CO2: 27 mmol/L (ref 22–32)
Calcium: 8.8 mg/dL — ABNORMAL LOW (ref 8.9–10.3)
Chloride: 96 mmol/L — ABNORMAL LOW (ref 98–111)
Creatinine, Ser: 0.96 mg/dL (ref 0.61–1.24)
GFR, Estimated: >60 mL/min (ref >60)
Glucose, Bld: 134 mg/dL — ABNORMAL HIGH (ref 70–99)
Potassium: 4.4 mmol/L (ref 3.5–5.1)
Sodium: 129 mmol/L — ABNORMAL LOW (ref 135–145)
Total Bilirubin: 0.4 mg/dL (ref 0.0–1.2)
Total Protein: 6.4 g/dL — ABNORMAL LOW (ref 6.5–8.1)

## 2024-07-29 LAB — GLUCOSE, CAPILLARY
Glucose-Capillary: 133 mg/dL — ABNORMAL HIGH (ref 70–99)
Glucose-Capillary: 169 mg/dL — ABNORMAL HIGH (ref 70–99)
Glucose-Capillary: 104 mg/dL — ABNORMAL HIGH (ref 70–99)
Glucose-Capillary: 168 mg/dL — ABNORMAL HIGH (ref 70–99)

## 2024-07-29 LAB — CK: Total CK: 186 U/L (ref 49–397)

## 2024-07-29 LAB — MAGNESIUM: Magnesium: 1.9 mg/dL (ref 1.7–2.4)

## 2024-07-29 MED ORDER — LABETALOL HCL 5 MG/ML IV SOLN: 10.0000 mg | INTRAVENOUS | Status: DC | PRN

## 2024-07-29 MED ORDER — HYDROCODONE-ACETAMINOPHEN 5-325 MG PO TABS: 1.0000 | ORAL_TABLET | ORAL | Status: DC | PRN

## 2024-07-29 MED ORDER — ROSUVASTATIN CALCIUM 20 MG PO TABS
20.0000 mg | ORAL_TABLET | Freq: Every day | ORAL | Status: DC
  Administered 2024-07-29 – 2024-07-31 (×3): 20 mg via ORAL
  Filled 2024-07-29 (×3): qty 1

## 2024-07-29 NOTE — Progress Notes (Addendum)
 PROGRESS NOTE  KHI MCMILLEN FMW:[id number] DOB: 1956-07-06   PCP: Geofm Glade PARAS, MD  Patient is from: Home.  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 Usually ambulates with the help of walker and last 2 weeks he has been using the wheelchair.  CT angiogram of lower extremity with poor visualization of the arteries.  Vascular surgery consulted.  Patient was started on IV heparin  and ceftriaxone .    Patient underwent aortogram and lower extremity angiogram that showed sluggish flow in right lower extremity.  Patient underwent right AKA by Dr. Serene on 8/20.  No postop complication.  Therapy recommended CIR. Insurance authorization pending.    Subjective: Seen and examined earlier this morning.  No major events overnight or this morning.  No complaints.   Objective: Vitals:   07/29/24 0011 07/29/24 0447 07/29/24 0745 07/29/24 1246  BP: 104/69 111/77 (!) 138/92 106/82  Pulse: 72 82 98 84  Resp: 18 19  16   Temp: 98.2 F (36.8 C) 98.5 F (36.9 C)  98.8 F (37.1 C)  TempSrc: Oral Oral  Oral  SpO2: 99% 100% 99% 98%  Weight:      Height:        Examination:  GENERAL: No apparent distress.  Appears frail. HEENT: MMM.  Vision and hearing grossly intact.  NECK: Supple.  No apparent JVD.  RESP:  No IWOB.  Fair aeration bilaterally. CVS: HR in 90s.  Heart sounds normal.  ABD/GI/GU: BS+. Abd soft, NTND.  MSK/EXT: Right AKA.  Ace wrap and dressing DCI. SKIN: As above. NEURO: AA.  Oriented appropriately.  No apparent focal neuro deficit. PSYCH: Calm. Normal affect.    Consultants:  Vascular surgery  Procedures: 8/18-aortogram and lower extremity arteriogram with sluggish flow in right lower extremity 8/20-right AKA  Microbiology summarized: MRSA PCR screen nonreactive.  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  CT angio with poor visualization of arteries.  Arteriogram with sluggish flow in RLE.  CRP elevated to 16.  Leukocytosis and CK improved after AKA.  -S/p right AKA on 8/20.  Outpatient follow-up with [facility] on 9/29 for staple removal -Cefepime  8/15>> ceftriaxone  8/17-8/21 -Pain control and bowel regimen -Therapy recommended CIR.  Chronic systolic CHF/ICM: TTE in 06/2022 with LVEF of 20 to 25%, GH, G1-DD.  Appears euvolemic on exam.  No respiratory distress.  Not on diuretics at home. -Continue home Jardiance . -Decreased metoprolol  -Continue holding losartan . -Closely monitor respiratory and fluid status.  History of CAD/chronic LBBB: No cardiopulmonary symptoms. - Medication as above.  Hx of embolic CVA w/ L sided weakness-on Eliquis  at home. - Continue home Eliquis . - Resume home Crestor .  NIDDM-2 with hyperglycemia: A1c 5.9%. Recent Labs  Lab 07/28/24 1243 07/28/24 1612 07/28/24 2102 07/29/24 0745 07/29/24 1244  GLUCAP 183* 156* 104* 133* 169*  - Continue SSI-sensitive.  Sinus tachycardia with PACs: Resolved. - Continue metoprolol  - Continue telemetry  Hyperlipidemia - Resume home Crestor .  Myonecrosis: Likely due to critical limb ischemia.  Improved - Continue holding Crestor  -  Recheck CK in the morning  Constipation: No nausea, vomiting or tenderness. - Scheduled Senokot and MiraLAX   Leukocytosis: Likely demargination.  Resolved.  Severe malnutrition Body mass index is 19.26 kg/m. Nutrition Problem: Severe Malnutrition Etiology: chronic illness, decreased appetite Signs/Symptoms: severe muscle  depletion, severe fat depletion Interventions: Refer to RD note for recommendations   DVT prophylaxis:  SCD's Start: 07/23/24 1648 apixaban  (ELIQUIS ) tablet 5 mg  Code Status: Full code Family Communication: None at bedside. Level of care: Med-Surg Status is: Inpatient Remains inpatient appropriate because: Chronic limb threatening ischemia   Final disposition: CIR   35 minutes with more than 50% spent in reviewing records, counseling patient/family and coordinating care.   Sch Meds:  Scheduled Meds:  (feeding supplement) PROSource Plus  30 mL Oral BID BM   apixaban   5 mg Oral BID   aspirin  EC  81 mg Oral Daily   docusate sodium   100 mg Oral Daily   feeding supplement  237 mL Oral TID BM   insulin  aspart  0-9 Units Subcutaneous TID WC   metoprolol  tartrate  12.5 mg Oral BID   multivitamin with minerals  1 tablet Oral Daily   nutrition supplement (JUVEN)  1 packet Oral BID BM   rosuvastatin   20 mg Oral Daily   senna-docusate  2 tablet Oral BID   sodium chloride  flush  3 mL Intravenous Q12H   Continuous Infusions:   PRN Meds:.acetaminophen , HYDROcodone -acetaminophen , labetalol , metoprolol  tartrate, ondansetron  (ZOFRAN ) IV, phenol, [COMPLETED] polyethylene glycol **FOLLOWED BY** polyethylene glycol, sodium chloride  flush  Antimicrobials: Anti-infectives (From admission, onward)    Start     Dose/Rate Route Frequency Ordered Stop   07/22/24 1700  cefTRIAXone  (ROCEPHIN ) 2 g in sodium chloride  0.9 % 100 mL IVPB  Status:  Discontinued        2 g 200 mL/hr over 30 Minutes Intravenous Every 24 hours 07/22/24 0945 07/24/24 1052   07/20/24 1030  cefTRIAXone  (ROCEPHIN ) 2 g in sodium chloride  0.9 % 100 mL IVPB  Status:  Discontinued        2 g 200 mL/hr over 30 Minutes Intravenous Every 24 hours 07/20/24 0939 07/22/24 0945   07/18/24 1400  ceFEPIme  (MAXIPIME ) 2 g in sodium chloride  0.9 % 100 mL IVPB  Status:  Discontinued        2 g 200 mL/hr over 30 Minutes Intravenous Every 12  hours 07/18/24 1258 07/20/24 0938   07/18/24 1245  ceFEPIme  (MAXIPIME ) 2 g in sodium chloride  0.9 % 100 mL IVPB  Status:  Discontinued        2 g 200 mL/hr over 30 Minutes Intravenous Every 8 hours 07/18/24 1153 07/18/24 1258        I have personally reviewed the following labs and images: CBC: Recent Labs  Lab 07/24/24 0439 07/25/24 1301 07/26/24 0905 07/27/24 0513 07/29/24 0623  WBC 9.4 13.8* 15.1* 11.6* 7.9  HGB 11.0* 12.8* 11.1* 10.6* 11.2*  HCT 34.9* 41.1 36.1* 34.2* 35.9*  MCV 79.1* 79.2* 80.8 80.1 79.2*  PLT 304 421* 361 311 387   BMP &GFR Recent Labs  Lab 07/23/24 0422 07/24/24 0439 07/25/24 0300 07/26/24 0715 07/27/24 0513 07/29/24 0623  NA 132* 131* 132* 133* 132* 129*  K 3.8 4.5 4.5 4.3 4.2 4.4  CL 100 97* 98 101 100 96*  CO2 23 25 27 26 27 27   GLUCOSE 129* 148* 176* 131* 105* 134*  BUN 14 13 26* 31* 25* 25*  CREATININE 1.03 0.97 1.17 1.07 1.01 0.96  CALCIUM  8.7*  9.0 9.0 8.7* 8.5* 8.8*  MG 2.0 2.0 2.2 2.2 1.8 1.9  PHOS 2.4*  --  1.5* 2.8 2.4*  --    Estimated Creatinine Clearance: 69.8 mL/min (by C-G formula based on SCr of 0.96 mg/dL). Liver & Pancreas: Recent Labs  Lab 07/23/24 0422 07/24/24 0439 07/25/24 0300 07/26/24 0715 07/27/24 0513 07/29/24 0623  AST 64* 41  --   --   --  31  ALT 24 19  --   --   --  33  ALKPHOS 60 50  --   --   --  93  BILITOT 0.7 0.4  --   --   --  0.4  PROT 6.8 6.3*  --   --   --  6.4*  ALBUMIN  2.1* 2.0* 2.2* 1.9* 1.6* 1.8*   No results for input(s): LIPASE, AMYLASE in the last 168 hours. No results for input(s): AMMONIA in the last 168 hours. Diabetic: No results for input(s): HGBA1C in the last 72 hours. Recent Labs  Lab 07/28/24 1243 07/28/24 1612 07/28/24 2102 07/29/24 0745 07/29/24 1244  GLUCAP 183* 156* 104* 133* 169*   Cardiac Enzymes: Recent Labs  Lab 07/23/24 0422 07/24/24 0439 07/25/24 0300 07/26/24 0715 07/29/24 0623  CKTOTAL 1,938* 696* 1,150* 1,149* 186   No results for  input(s): PROBNP in the last 8760 hours. Coagulation Profile: No results for input(s): INR, PROTIME in the last 168 hours.  Thyroid  Function Tests: No results for input(s): TSH, T4TOTAL, FREET4, T3FREE, THYROIDAB in the last 72 hours. Lipid Profile: No results for input(s): CHOL, HDL, LDLCALC, TRIG, CHOLHDL, LDLDIRECT in the last 72 hours.  Anemia Panel: No results for input(s): VITAMINB12, FOLATE, FERRITIN, TIBC, IRON, RETICCTPCT in the last 72 hours. Urine analysis:    Component Value Date/Time   COLORURINE YELLOW 06/29/2022 1821   APPEARANCEUR CLOUDY (A) 06/29/2022 1821   LABSPEC 1.029 06/29/2022 1821   PHURINE 6.0 06/29/2022 1821   GLUCOSEU >=500 (A) 06/29/2022 1821   HGBUR MODERATE (A) 06/29/2022 1821   BILIRUBINUR NEGATIVE 06/29/2022 1821   BILIRUBINUR moderate 07/09/2014 1428   KETONESUR 20 (A) 06/29/2022 1821   PROTEINUR 30 (A) 06/29/2022 1821   UROBILINOGEN 0.2 08/07/2015 0854   NITRITE NEGATIVE 06/29/2022 1821   LEUKOCYTESUR LARGE (A) 06/29/2022 1821   Sepsis Labs: Invalid input(s): PROCALCITONIN, LACTICIDVEN  Microbiology: No results found for this or any previous visit (from the past 240 hours).   Radiology Studies: No results found.     Ronit Cranfield T. Kj Imbert [facility]  If 7PM-7AM, please contact night-coverage [email or web address] 07/29/2024, 3:06 PM

## 2024-07-29 NOTE — NC FL2 (Signed)
 [place]  MEDICAID [id number] LEVEL OF CARE FORM     IDENTIFICATION  Patient Name: Karl [name] Birthdate: 05/26/1956 Sex: male Admission Date (Current Location): 07/17/2024  [facility] and [place] Number:  Producer, television/film/video and Address:  [facility] [place], [address] [place], [place] [address]      Provider Number: [id number]  Attending Physician Name and Address:  Kathrin Mignon ONEIDA, MD  Relative Name and Phone Number:  Desiderio Cook (Granddaughter) [phone]    Current Level of Care: Hospital Recommended Level of Care: Skilled Nursing Facility Prior Approval Number:    Date Approved/Denied:   PASRR Number:    Discharge Plan: SNF    Current Diagnoses: Patient Active Problem List   Diagnosis Date Noted   Protein-calorie malnutrition, severe 07/25/2024   Ischemic foot 07/18/2024   CKD (chronic kidney disease) stage 3, GFR 30-59 ml/min (HCC) 09/30/2023   Biliary cyst 06/29/2022   History of pancreatitis 06/29/2022   Anemia 09/12/2021   Lump of skin 03/14/2021   COVID-19 virus infection 12/13/2020   Pneumonia due to COVID-19 virus 12/13/2020   Spastic hemiparesis (HCC) 07/06/2020   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 12/24/2019   Shingles 06/24/2019   Left wrist pain 12/13/2018   Chronic anticoagulation    History of CVA (cerebrovascular accident) 06/18/2018   Encounter for therapeutic drug monitoring 02/08/2018   Coronary artery disease 12/19/2017   Hip flexor tendinitis 05/22/2017   Pain of both hip joints 04/25/2017   Dyslipidemia 09/01/2015   BPH without urinary obstruction 09/01/2015   Diabetes (HCC) 07/10/2014   Essential hypertension 04/20/2009   Cardiomyopathy, ischemic 04/20/2009   CHF (congestive heart failure) (HCC) 04/20/2009   Chronic systolic heart failure (HCC) 04/20/2009    Orientation RESPIRATION BLADDER Height & Weight     Self, Time, Situation, Place  Normal Continent Weight: 150 lb (68 kg) Height:  6' 2 (188 cm)   BEHAVIORAL SYMPTOMS/MOOD NEUROLOGICAL BOWEL NUTRITION STATUS        Diet (Please see discharge summary)  AMBULATORY STATUS COMMUNICATION OF NEEDS Skin   Limited Assist Verbally Other (Comment) (Erythema,Leg,R,Wound/Incision LDAs,Wound surgical,closed,Incision,Leg,R)                       Personal Care Assistance Level of Assistance  Bathing, Feeding, Dressing Bathing Assistance: Limited assistance Feeding assistance: Independent Dressing Assistance: Limited assistance     Functional Limitations Info  Sight, Hearing, Speech Sight Info: Impaired Financial trader) Hearing Info: Adequate Speech Info: Adequate    SPECIAL CARE FACTORS FREQUENCY  PT (By licensed PT), OT (By licensed OT)     PT Frequency: 5x min weekly OT Frequency: 5x min weekly            Contractures Contractures Info: Not present    Additional Factors Info  Code Status, Allergies, Insulin  Sliding Scale Code Status Info: FULL Allergies Info: Lipitor (atorvastatin)   Insulin  Sliding Scale Info: insulin  aspart (novoLOG ) injection 0-9 Units 3 times daily with meals       Current Medications (07/29/2024):  This is the current hospital active medication list Current Facility-Administered Medications  Medication Dose Route Frequency Provider Last Rate Last Admin   (feeding supplement) PROSource Plus liquid 30 mL  30 mL Oral BID BM Gonfa, Taye T, MD   30 mL at 07/29/24 1503   acetaminophen  (TYLENOL ) tablet 650 mg  650 mg Oral Q4H PRN Baglia, Corrina, PA-C       apixaban  (ELIQUIS ) tablet 5 mg  5 mg Oral BID Gonfa, Taye T, MD   5 mg at 07/29/24 9090   aspirin  EC tablet 81 mg  81 mg Oral Daily Baglia, Corrina, PA-C   81 mg at 07/29/24 0908   docusate sodium  (COLACE) capsule 100 mg  100 mg Oral Daily Baglia, Corrina, PA-C   100 mg at 07/29/24 9090   feeding supplement (ENSURE PLUS HIGH PROTEIN) liquid 237 mL  237 mL Oral TID BM Gonfa, Taye T, MD   237 mL at 07/29/24 1505   HYDROcodone -acetaminophen  (NORCO/VICODIN)  5-325 MG per tablet 1-2 tablet  1-2 tablet Oral Q4H PRN Gonfa, Taye T, MD       insulin  aspart (novoLOG ) injection 0-9 Units  0-9 Units Subcutaneous TID WC Baglia, Corrina, PA-C   2 Units at 07/29/24 1250   labetalol  (NORMODYNE ) injection 10 mg  10 mg Intravenous Q2H PRN Gonfa, Taye T, MD       metoprolol  tartrate (LOPRESSOR ) injection 2-5 mg  2-5 mg Intravenous Q2H PRN Baglia, Corrina, PA-C       metoprolol  tartrate (LOPRESSOR ) tablet 12.5 mg  12.5 mg Oral BID Gonfa, Taye T, MD   12.5 mg at 07/29/24 0908   multivitamin with minerals tablet 1 tablet  1 tablet Oral Daily Gonfa, Taye T, MD   1 tablet at 07/29/24 0908   nutrition supplement (JUVEN) (JUVEN) powder packet 1 packet  1 packet Oral BID BM Gonfa, Taye T, MD   1 packet at 07/29/24 1504   ondansetron  (ZOFRAN ) injection 4 mg  4 mg Intravenous Q6H PRN Baglia, Corrina, PA-C   4 mg at 07/24/24 2033   phenol (CHLORASEPTIC) mouth spray 1 spray  1 spray Mouth/Throat PRN Baglia, Corrina, PA-C       polyethylene glycol (MIRALAX  / GLYCOLAX ) packet 17 g  17 g Oral BID PRN Gonfa, Taye T, MD       rosuvastatin  (CRESTOR ) tablet 20 mg  20 mg Oral Daily Gonfa, Taye T, MD       senna-docusate (Senokot-S) tablet 2 tablet  2 tablet Oral BID Gonfa, Taye T, MD   2 tablet at 07/29/24 0909   sodium chloride  flush (NS) 0.9 % injection 3 mL  3 mL Intravenous Q12H Baglia, Corrina, PA-C   3 mL at 07/29/24 9083   sodium chloride  flush (NS) 0.9 % injection 3 mL  3 mL Intravenous PRN Baglia, Corrina, PA-C         Discharge Medications: Please see discharge summary for a list of discharge medications.  Relevant Imaging Results:  Relevant Lab Results:   Additional Information [id number]  Isaiah Public, LCSWA

## 2024-07-29 NOTE — Progress Notes (Signed)
 Physical Therapy Treatment Patient Details Name: Karl [name] MRN: [id number] DOB: [birth year] Today's Date: 07/29/2024   History of Present Illness 68 yo male adm 07/17/24 with RLE ischemia s/p Rt AKA 8/20. PMhx:ICM, LBBB, HLD, BPH, CAD, CVA with Lt weakness, CKD, T2DM, CHF, HTN    PT Comments  PT pleasant and continues to be agreeable to attempt increased mobility and HEP. Standing hip extension and transfer performed with max assist and RW. Pt benefits from elevated surfaces for standing trials and educated for continued chair push ups and HEP. Will continue to follow. Patient will benefit from intensive inpatient follow-up therapy, >3 hours/day     If plan is discharge home, recommend the following: A little help with walking and/or transfers;A little help with bathing/dressing/bathroom;Assistance with cooking/housework;Assist for transportation   Can travel by private vehicle        Equipment Recommendations  Other (comment) (tub bench)    Recommendations for Other Services       Precautions / Restrictions Precautions Precautions: Fall;Other (comment) Recall of Precautions/Restrictions: Impaired Precaution/Restrictions Comments: R AKA     Mobility  Bed Mobility Overal bed mobility: Modified Independent Bed Mobility: Supine to Sit           General bed mobility comments: with use of rail and bed flat    Transfers Overall transfer level: Needs assistance   Transfers: Sit to/from Stand, Bed to chair/wheelchair/BSC Sit to Stand: Max assist Stand pivot transfers: Max assist         General transfer comment: max assist from elevated bed height with LLE blocked and max cues. 3 standing attempts with assist of pad at sacrum to rise with 2 successful full stands with cues for hip and trunk extension. RW present and max assist to control balance and movement to pivot in standing bed to chair with RW    Ambulation/Gait               General Gait Details:  unable   Stairs             Wheelchair Mobility     Tilt Bed    Modified Rankin (Stroke Patients Only)       Balance Overall balance assessment: Needs assistance Sitting-balance support: Feet supported, No upper extremity supported Sitting balance-Leahy Scale: Fair     Standing balance support: Bilateral upper extremity supported Standing balance-Leahy Scale: Poor Standing balance comment: RW and mod assist in standing                            Communication Communication Communication: No apparent difficulties  Cognition Arousal: Alert Behavior During Therapy: Flat affect   PT - Cognitive impairments: No apparent impairments                         Following commands: Intact      Cueing Cueing Techniques: Verbal cues, Gestural cues  Exercises General Exercises - Upper Extremity Chair Push Up: AROM, Both, 10 reps, Seated, Strengthening General Exercises - Lower Extremity Long Arc Quad: AROM, Left, 20 reps, Seated, Strengthening Amputee Exercises Hip Extension: Right, 10 reps, Standing, AROM Hip ABduction/ADduction: AROM, Right, 20 reps, Seated    General Comments        Pertinent Vitals/Pain Pain Assessment Pain Assessment: No/denies pain    Home Living  Prior Function            PT Goals (current goals can now be found in the care plan section) Progress towards PT goals: Progressing toward goals    Frequency    Min 3X/week      PT Plan      Co-evaluation              AM-PAC PT 6 Clicks Mobility   Outcome Measure  Help needed turning from your back to your side while in a flat bed without using bedrails?: A Little Help needed moving from lying on your back to sitting on the side of a flat bed without using bedrails?: A Little Help needed moving to and from a bed to a chair (including a wheelchair)?: A Little Help needed standing up from a chair using your arms  (e.g., wheelchair or bedside chair)?: A Lot Help needed to walk in hospital room?: Total Help needed climbing 3-5 steps with a railing? : Total 6 Click Score: 13    End of Session Equipment Utilized During Treatment: Gait belt Activity Tolerance: Patient tolerated treatment well Patient left: in chair;with call bell/phone within reach;with chair alarm set;with nursing/sitter in room Nurse Communication: Mobility status PT Visit Diagnosis: Other abnormalities of gait and mobility (R26.89);Muscle weakness (generalized) (M62.81);Difficulty in walking, not elsewhere classified (R26.2)     Time: 0722-0748 PT Time Calculation (min) (ACUTE ONLY): 26 min  Charges:    $Therapeutic Activity: 23-37 mins PT General Charges $$ ACUTE PT VISIT: 1 Visit                     [name], PT Acute Rehabilitation Services Office: [phone]    Lenoard KATHEE Docker 07/29/2024, 7:54 AM

## 2024-07-29 NOTE — TOC Progression Note (Addendum)
 Transition of Care [facility]) - Progression Note    Patient Details  Name: Karl [name] MRN: [id number] Date of Birth: [birth year]  [facility] CM/SW Contact  Isaiah Public, LCSWA Phone Number: 07/29/2024, 3:53 PM  Clinical Narrative:     Per patients request CSW spoke with patients Granddaughter Desiderio regarding SNF placement. Desiderio gave CSW permission to fax out for SNF placement for patient. CSW awaiting SNF bed offers. [facility] must currently down unable to look up/complete passr for patient. CSW will follow back up. CSW will continue to follow.  Update- Patient gave CSW permission to speak with his daughter [name] at bedside. Patient gave CSW permission to call his daughter [name] to provide SNF bed offers when they come in. CSW requested for Jon CMA to start insurance authorization for patient.All questions answered. No further questions reported at this time.  Update- Jon CMA informed CSW that patients insurance authorization currently pending. Mayme Barrows PI#[id number].  Expected Discharge Plan: Home/Self Care Barriers to Discharge: Continued Medical Work up               Expected Discharge Plan and Services   Discharge Planning Services: CM Consult   Living arrangements for the past 2 months: Apartment                                       Social Drivers of Health (SDOH) Interventions SDOH Screenings   Food Insecurity: No Food Insecurity (07/18/2024)  Housing: Low Risk  (07/18/2024)  Transportation Needs: No Transportation Needs (07/18/2024)  Utilities: At Risk (07/18/2024)  Depression (PHQ2-9): Low Risk  (03/31/2024)  Social Connections: Socially Isolated (07/18/2024)  Tobacco Use: Medium Risk (07/23/2024)    Readmission Risk Interventions    07/03/2022   12:58 PM  Readmission Risk Prevention Plan  Post Dischage Appt Complete  Medication Screening Complete  Transportation Screening Complete

## 2024-07-29 NOTE — Progress Notes (Signed)
   Inpatient Rehabilitation Admissions Coordinator   I await insurance approval for possible Cir admit.  Heron Leavell, RN, MSN Rehab Admissions Coordinator [phone] 07/29/2024 11:46 AM

## 2024-07-29 NOTE — Progress Notes (Signed)
   Inpatient Rehabilitation Admissions Coordinator   We have received a denial from [facility] for CIR admit after peer to peer review with Dr Emeline and [facility] MD. I met with patient at bedside and per his request contacted his granddaughter, Desiderio. We are not appealing. She prefers to look at [facility] in [place]. I will alert acute team and TOC. We will sign off.  Heron Leavell, RN, MSN Rehab Admissions Coordinator [phone] 07/29/2024 3:17 PM

## 2024-07-29 NOTE — Plan of Care (Signed)
  Problem: Education: Goal: Knowledge of General Education information will improve Description: Including pain rating scale, medication(s)/side effects and non-pharmacologic comfort measures Outcome: Progressing   Problem: Clinical Measurements: Goal: Ability to maintain clinical measurements within normal limits will improve Outcome: Progressing   Problem: Clinical Measurements: Goal: Will remain free from infection Outcome: Progressing   Problem: Clinical Measurements: Goal: Cardiovascular complication will be avoided Outcome: Progressing   Problem: Activity: Goal: Risk for activity intolerance will decrease Outcome: Progressing   Problem: Nutrition: Goal: Adequate nutrition will be maintained Outcome: Progressing   Problem: Elimination: Goal: Will not experience complications related to bowel motility Outcome: Progressing   Problem: Elimination: Goal: Will not experience complications related to bowel motility Outcome: Progressing   Problem: Pain Managment: Goal: General experience of comfort will improve and/or be controlled Outcome: Progressing   Problem: Skin Integrity: Goal: Risk for impaired skin integrity will decrease Outcome: Progressing   Problem: Coping: Goal: Ability to adjust to condition or change in health will improve Outcome: Progressing   Problem: Metabolic: Goal: Ability to maintain appropriate glucose levels will improve Outcome: Progressing   Problem: Health Behavior/Discharge Planning: Goal: Ability to manage health-related needs will improve Outcome: Progressing   Problem: Skin Integrity: Goal: Risk for impaired skin integrity will decrease Outcome: Progressing   Problem: Tissue Perfusion: Goal: Adequacy of tissue perfusion will improve Outcome: Progressing

## 2024-07-29 NOTE — Progress Notes (Signed)
  Progress Note    07/29/2024 6:42 AM 6 Days Post-Op  Subjective:  no complaints.  Wakes easily  Afebrile  Vitals:   07/29/24 0011 07/29/24 0447  BP: 104/69 111/77  Pulse: 72 82  Resp: 18 19  Temp: 98.2 F (36.8 C) 98.5 F (36.9 C)  SpO2: 99% 100%    Physical Exam: Incisions:  clean with staples in tact    CBC    Component Value Date/Time   WBC 11.6 (H) 07/27/2024 0513   RBC 4.27 07/27/2024 0513   HGB 10.6 (L) 07/27/2024 0513   HCT 34.2 (L) 07/27/2024 0513   PLT 311 07/27/2024 0513   MCV 80.1 07/27/2024 0513   MCH 24.8 (L) 07/27/2024 0513   MCHC 31.0 07/27/2024 0513   RDW 15.9 (H) 07/27/2024 0513   LYMPHSABS 1.3 07/18/2024 0817   MONOABS 1.7 (H) 07/18/2024 0817   EOSABS 0.0 07/18/2024 0817   BASOSABS 0.0 07/18/2024 0817    BMET    Component Value Date/Time   NA 132 (L) 07/27/2024 0513   NA 135 12/25/2017 1143   K 4.2 07/27/2024 0513   CL 100 07/27/2024 0513   CO2 27 07/27/2024 0513   GLUCOSE 105 (H) 07/27/2024 0513   BUN 25 (H) 07/27/2024 0513   BUN 15 12/25/2017 1143   CREATININE 1.01 07/27/2024 0513   CREATININE 1.69 (H) 07/06/2020 1009   CALCIUM  8.5 (L) 07/27/2024 0513   GFRNONAA >60 07/27/2024 0513   GFRNONAA 42 (L) 07/06/2020 1009   GFRAA 48 (L) 07/06/2020 1009    INR    Component Value Date/Time   INR 1.3 (H) 07/21/2024 0536   INR 8.0 05/22/2024 1112     Intake/Output Summary (Last 24 hours) at 07/29/2024 9357 Last data filed at 07/29/2024 0600 Gross per 24 hour  Intake 360 ml  Output 650 ml  Net -290 ml     Assessment/Plan:  68 y.o. male is s/p right above knee amputation   6 Days Post-Op   -right AKA incision looks good with staples in tact.  -clean daily with soap and water  and pat dry.  -insurance authorization started for CIR -pt has follow up in our office 9/29 for staple removal -please call with questions.  Will follow intermittently while hospitalized.    Lucie Apt, PA-C [facility] [phone] 07/29/2024 6:42 AM

## 2024-07-30 DIAGNOSIS — I998 Other disorder of circulatory system: Secondary | ICD-10-CM | POA: Diagnosis not present

## 2024-07-30 LAB — GLUCOSE, CAPILLARY
Glucose-Capillary: 146 mg/dL — ABNORMAL HIGH (ref 70–99)
Glucose-Capillary: 186 mg/dL — ABNORMAL HIGH (ref 70–99)
Glucose-Capillary: 132 mg/dL — ABNORMAL HIGH (ref 70–99)
Glucose-Capillary: 166 mg/dL — ABNORMAL HIGH (ref 70–99)

## 2024-07-30 LAB — MAGNESIUM: Magnesium: 2.1 mg/dL (ref 1.7–2.4)

## 2024-07-30 LAB — RENAL FUNCTION PANEL
Albumin: 1.9 g/dL — ABNORMAL LOW (ref 3.5–5.0)
Anion gap: 6 (ref 5–15)
BUN: 25 mg/dL — ABNORMAL HIGH (ref 8–23)
CO2: 27 mmol/L (ref 22–32)
Calcium: 8.6 mg/dL — ABNORMAL LOW (ref 8.9–10.3)
Chloride: 98 mmol/L (ref 98–111)
Creatinine, Ser: 0.91 mg/dL (ref 0.61–1.24)
GFR, Estimated: >60 mL/min (ref >60)
Glucose, Bld: 134 mg/dL — ABNORMAL HIGH (ref 70–99)
Phosphorus: 2.4 mg/dL — ABNORMAL LOW (ref 2.5–4.6)
Potassium: 4.1 mmol/L (ref 3.5–5.1)
Sodium: 131 mmol/L — ABNORMAL LOW (ref 135–145)

## 2024-07-30 NOTE — Progress Notes (Addendum)
 Mobility Specialist Progress Note;   07/30/24 1132  Mobility  Activity Pivoted/transferred from chair to bed  Level of Assistance Standby assist, set-up cues, supervision of patient - no hands on  Assistive Device None  RLE Weight Bearing Per Provider Order NWB  Activity Response Tolerated well  Mobility Referral Yes  Mobility visit 1 Mobility  Mobility Specialist Start Time (ACUTE ONLY) 1132  Mobility Specialist Stop Time (ACUTE ONLY) 1143  Mobility Specialist Time Calculation (min) (ACUTE ONLY) 11 min   Pt requesting assistance back to bed. Required no physical assistance, SV for lateral scoot from chair to bed. VSS throughout and no c/o when asked. Pt left comfortably in bed with all needs met, alarm on.   Lauraine Erm Mobility Specialist Please contact via SecureChat or [employer] [phone]

## 2024-07-30 NOTE — Progress Notes (Signed)
 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 PMhx:ICM, LBBB, HLD, BPH, CAD, CVA with Lt weakness, CKD, T2DM, CHF, HTN    PT Comments  The pt was agreeable to session with goal of progressing OOB transfers. The pt continues to demo deficits core strength, LLE strength, and BUE strength to complete bed mobility, transfers, and scooting without assist. The pt was able to complete sit-stand transfers with less assist this session, and maintain standing balance with minA statically and modA with L knee bends. Pt highly motivated to progress functional independence and strength, continue to recommend intensive therapies aver d/c >3hours/day as I feel the pt could tolerate that level of activity well and benefit from that level of strengthening and activity to achieve his goals.    If plan is discharge home, recommend the following: A little help with walking and/or transfers;A little help with bathing/dressing/bathroom;Assistance with cooking/housework;Assist for transportation   Can travel by private vehicle        Equipment Recommendations  Other (comment) (tub bench)    Recommendations for Other Services       Precautions / Restrictions Precautions Precautions: Fall;Other (comment) Recall of Precautions/Restrictions: Impaired Precaution/Restrictions Comments: R AKA Restrictions Weight Bearing Restrictions Per Provider Order: Yes RLE Weight Bearing Per Provider Order: Non weight bearing     Mobility  Bed Mobility Overal bed mobility: Modified Independent Bed Mobility: Supine to Sit     Supine to sit: Min assist     General bed mobility comments: minA with rail to complete scooting and trunk elevation    Transfers Overall transfer level: Needs assistance Equipment used: Rolling walker (2 wheels) Transfers: Sit  to/from Stand, Bed to chair/wheelchair/BSC Sit to Stand: Mod assist          Lateral/Scoot Transfers: Min assist General transfer comment: minA to prevent posterior LOB with lateral scoot to R side, modA to rise to standing from chair x2 with modA to maintain standing balance with LLE knee bends x5    Ambulation/Gait               General Gait Details: unable    Balance Overall balance assessment: Needs assistance Sitting-balance support: Feet supported, No upper extremity supported Sitting balance-Leahy Scale: Fair Sitting balance - Comments: posterior LOB with balance challenge   Standing balance support: Bilateral upper extremity supported Standing balance-Leahy Scale: Poor Standing balance comment: RW and mod assist in standing                            Communication Communication Communication: No apparent difficulties  Cognition Arousal: Alert Behavior During Therapy: Flat affect   PT - Cognitive impairments: No apparent impairments                       PT - Cognition Comments: not formally tested but following commands within session appropriately Following commands: Intact      Cueing Cueing Techniques: Verbal cues, Gestural cues  Exercises Other Exercises Other Exercises: standing knee bends x5 with modA to maintain balance    General Comments General comments (skin integrity, edema, etc.): VSS on RA      Pertinent Vitals/Pain Pain Assessment Pain Assessment: No/denies pain Pain Score: 0-No pain Pain Intervention(s): Monitored during session     PT Goals (current goals can  now be found in the care plan section) Acute Rehab PT Goals Patient Stated Goal: to regain strength, go to rehab PT Goal Formulation: With patient Time For Goal Achievement: 08/07/24 Potential to Achieve Goals: Good Progress towards PT goals: Progressing toward goals    Frequency    Min 3X/week       AM-PAC PT 6 Clicks Mobility   Outcome  Measure  Help needed turning from your back to your side while in a flat bed without using bedrails?: A Little Help needed moving from lying on your back to sitting on the side of a flat bed without using bedrails?: A Little Help needed moving to and from a bed to a chair (including a wheelchair)?: A Little Help needed standing up from a chair using your arms (e.g., wheelchair or bedside chair)?: A Lot Help needed to walk in hospital room?: Total Help needed climbing 3-5 steps with a railing? : Total 6 Click Score: 13    End of Session Equipment Utilized During Treatment: Gait belt Activity Tolerance: Patient tolerated treatment well Patient left: in chair;with call bell/phone within reach;with chair alarm set;with nursing/sitter in room Nurse Communication: Mobility status PT Visit Diagnosis: Other abnormalities of gait and mobility (R26.89);Muscle weakness (generalized) (M62.81);Difficulty in walking, not elsewhere classified (R26.2)     Time: 9142-9079 PT Time Calculation (min) (ACUTE ONLY): 23 min  Charges:    $Therapeutic Exercise: 23-37 mins PT General Charges $$ ACUTE PT VISIT: 1 Visit                     [name], PT, DPT   Acute Rehabilitation Department Office [phone] Secure Chat Communication Preferred   Izetta JULIANNA Call 07/30/2024, 1:40 PM

## 2024-07-30 NOTE — Progress Notes (Signed)
 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 PMhx:ICM, LBBB, HLD, BPH, CAD, CVA with Lt weakness, CKD, T2DM, CHF, HTN   OT comments  Pt progressing well towards goals. Pt reporting fatigue from previous PT session but agreeable to work on ADLs. Educated pt on compensatory strategy of lateral leans and hip bridges for LB dressing. Pt able to thread legs seated EOB, but difficulty clearing bottom with leans, requiring significantly increased time. Pt improved with hip bridges for pulling above waist to min assist, completed 5 reps at bed level. Pt continues to be limited by decreased strength and balance. Updated d/c recs to  <3 hours of skilled rehab daily to optimize independence levels. Will continue to follow acutely.       If plan is discharge home, recommend the following:  A lot of help with walking and/or transfers;A lot of help with bathing/dressing/bathroom;Assist for transportation   Equipment Recommendations  BSC/3in1 (drop arm bariatric BSC)    Recommendations for Other Services      Precautions / Restrictions Precautions Precautions: Fall;Other (comment) Recall of Precautions/Restrictions: Impaired Precaution/Restrictions Comments: R AKA Restrictions Weight Bearing Restrictions Per Provider Order: Yes RLE Weight Bearing Per Provider Order: Non weight bearing       Mobility Bed Mobility Overal bed mobility: Modified Independent Bed Mobility: Supine to Sit, Sit to Supine     General bed mobility comments: mod I increased time, no assist    Transfers   General transfer comment: Session focused on ADLs     Balance Overall balance assessment: Needs assistance Sitting-balance support: Feet supported, No upper extremity supported Sitting balance-Leahy Scale: Fair Sitting balance - Comments: posterior  LOB with balance challenge             ADL either performed or assessed with clinical judgement   ADL Overall ADL's : Needs assistance/impaired       Lower Body Dressing: Sitting/lateral leans;Bed level;Minimal assistance Lower Body Dressing Details (indicate cue type and reason): Able to thread BLE assist to pull over waist with lateral leans or hip bridges         General ADL Comments: Simulated LB, LUE ataxia limiting efficiency in dressing requiring increased time and effort    Extremity/Trunk Assessment Upper Extremity Assessment Upper Extremity Assessment: LUE deficits/detail LUE Deficits / Details: Hx of CVA L side affected. Deltoid and biceps 3+/5, intention tremors and dysmetria LUE Sensation: decreased light touch LUE Coordination: decreased fine motor   Lower Extremity Assessment Lower Extremity Assessment: Defer to PT evaluation        Vision   Vision Assessment?: No apparent visual deficits         Communication Communication Communication: No apparent difficulties   Cognition Arousal: Alert Behavior During Therapy: Flat affect Cognition: No apparent impairments     OT - Cognition Comments: Mild delayed processing speed but largely [facility]       Following commands: Intact        Cueing   Cueing Techniques: Verbal cues, Gestural cues  Exercises Exercises: Other exercises Other Exercises Other Exercises: x5 bed level hip bridges       General Comments VSS on RA    Pertinent Vitals/ Pain       Pain Assessment Pain Assessment: No/denies pain Pain Intervention(s): Monitored during session   Frequency  Min 2X/week  Progress Toward Goals  OT Goals(current goals can now be found in the care plan section)  Progress towards OT goals: Progressing toward goals  Acute Rehab OT Goals Patient Stated Goal: To get better OT Goal Formulation: With patient Time For Goal Achievement: 08/07/24 Potential to Achieve Goals: Good ADL  Goals Pt Will Perform Lower Body Dressing: sit to/from stand;sitting/lateral leans;with min assist Pt Will Transfer to Toilet: with min assist;stand pivot transfer;squat pivot transfer;with transfer board;bedside commode Pt Will Perform Toileting - Clothing Manipulation and hygiene: with min assist;sitting/lateral leans;sit to/from stand Additional ADL Goal #1: Pt will complete 10 chair pushups with CGA to increase BUE strength.  Plan         AM-PAC OT 6 Clicks Daily Activity     Outcome Measure   Help from another person eating meals?: None Help from another person taking care of personal grooming?: A Little Help from another person toileting, which includes using toliet, bedpan, or urinal?: A Lot Help from another person bathing (including washing, rinsing, drying)?: A Lot Help from another person to put on and taking off regular upper body clothing?: A Little Help from another person to put on and taking off regular lower body clothing?: A Lot 6 Click Score: 16    End of Session Equipment Utilized During Treatment: Gait belt  OT Visit Diagnosis: Unsteadiness on feet (R26.81);Other abnormalities of gait and mobility (R26.89);Muscle weakness (generalized) (M62.81)   Activity Tolerance Patient tolerated treatment well   Patient Left in bed;with call bell/phone within reach;with nursing/sitter in room   Nurse Communication Mobility status        Time: 8387-8371 OT Time Calculation (min): 16 min  Charges: OT General Charges $OT Visit: 1 Visit OT Treatments $Self Care/Home Management : 8-22 mins  Adrianne BROCKS, OT  Acute Rehabilitation Services Office [phone] Secure chat preferred   Adrianne GORMAN [employer] 07/30/2024, 4:34 PM

## 2024-07-30 NOTE — Progress Notes (Signed)
 [facility] PROGRESS NOTE    Progress Note  Karl [name]  FMW:[id number] DOB: 23-Jul-1956 DOA: 07/17/2024 PCP: Geofm Glade PARAS, MD     Brief Narrative:   Karl [name] is an 68 y.o. 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  PT evaluated the patient awaiting insurance authorization for skilled nursing facility placement  Assessment/Plan:   Right lower extremity/chronic limb threatening ischemia: Underwent angiogram that showed poor perfusion. Vascular surgery recommended AKA on 07/23/2024. For staple removal on 09/01/2024. Currently on a bowel regimen and pain regimen. PT evaluated the patient, awaiting insurance authorization for skilled nursing facility placement.  HFpEF: Continue Jardiance  and metoprolol . Holding ARB. He is not on diuretic at home. Last 2D echo showed an EF of 25% he appears euvolemic on physical exam.  History of coronary artery disease: Noted.  History of CVA: Continue Eliquis .  Diabetes mellitus type 2 with hyperglycemia: With an A1c of 5.9, continue sliding scale insulin .  Sinus tachycardia: Now resolved. Continue metoprolol .  Hyperlipidemia, Continue statins.  Constipation: Continue MiraLAX .  Leukocytosis: Likely reactive.  Severe protein caloric malnutrition: Noted.  DVT prophylaxis: lovenox  Family Communication:none Status is: Inpatient Remains inpatient appropriate because: Status post AKA awaiting insurance authorization    Code Status:     Code Status Orders  (From admission, onward)            Start     Ordered   07/18/24 0524  Full code  Continuous       Question:  By:  Answer:  Consent: discussion documented in EHR   07/18/24 0524           Code Status History     Date Active Date Inactive Code Status Order ID Comments User Context   06/29/2022 2044 07/04/2022 1708 Full Code [id number]  Seena Marsa NOVAK, MD ED   12/13/2020 0030 12/17/2020 1716 Full Code [id number]  Alfornia Madison, MD ED   06/18/2018 1850 07/09/2018 1412 Full Code [id number]  Verdene Purchase, MD Inpatient   06/18/2018 1817 06/18/2018 1850 Full Code [id number]  Maurice Sharlet RAMAN, PA-C Inpatient   06/14/2018 1541 06/18/2018 1815 Full Code [id number]  Jeannett Liming, DO Inpatient   06/05/2018 1320 06/06/2018 1512 Full Code [id number]  Carolee Sherwood JONETTA DOUGLAS, MD Inpatient   12/19/2017 2338 12/20/2017 2037 Full Code [id number]  Charlton Evalene RAMAN, MD ED      Advance Directive Documentation    Flowsheet Row Most Recent Value  Type of Advance Directive Healthcare Power of Attorney  Pre-existing out of facility DNR order (yellow form or pink MOST form) --  MOST Form in Place? --      IV Access:   Peripheral IV   Procedures and diagnostic studies:   No results found.   Medical Consultants:   None.   Subjective:    Karl [name] pain is controlled had a bowel movement  Objective:    Vitals:   07/29/24 2338 07/30/24 0340 07/30/24 0749 07/30/24 0900  BP: 111/77 104/72 112/76 108/71  Pulse: 80 75 81 81  Resp: 16 18 17 18   Temp: 98.7 F (37.1 C) 98.8  F (37.1 C) 98.7 F (37.1 C) 98.6 F (37 C)  TempSrc: Oral Oral Oral Oral  SpO2:  99% 99% 97%  Weight:      Height:       SpO2: 97 % O2 Flow Rate (L/min): 4 L/min   Intake/Output Summary (Last 24 hours) at 07/30/2024 1013 Last data filed at 07/29/2024 1500 Gross per 24 hour  Intake --  Output 300 ml  Net -300 ml   Filed Weights   07/18/24 0000 07/23/24 1224  Weight: 72.1 kg 68 kg    Exam: General exam: In no acute  distress. Respiratory system: Good air movement and clear to auscultation. Cardiovascular system: S1 & S2 heard, RRR. No JV. Gastrointestinal system: Abdomen is nondistended, soft and nontender.  Extremities: No pedal edema. Skin: No rashes, lesions or ulcers Psychiatry: Judgement and insight appear normal. Mood & affect appropriate.    Data Reviewed:    Labs: Basic Metabolic Panel: Recent Labs  Lab 07/25/24 0300 07/26/24 0715 07/27/24 0513 07/29/24 0623 07/30/24 0409  NA 132* 133* 132* 129* 131*  K 4.5 4.3 4.2 4.4 4.1  CL 98 101 100 96* 98  CO2 27 26 27 27 27   GLUCOSE 176* 131* 105* 134* 134*  BUN 26* 31* 25* 25* 25*  CREATININE 1.17 1.07 1.01 0.96 0.91  CALCIUM  9.0 8.7* 8.5* 8.8* 8.6*  MG 2.2 2.2 1.8 1.9 2.1  PHOS 1.5* 2.8 2.4*  --  2.4*   GFR Estimated Creatinine Clearance: 73.7 mL/min (by C-G formula based on SCr of 0.91 mg/dL). Liver Function Tests: Recent Labs  Lab 07/24/24 0439 07/25/24 0300 07/26/24 0715 07/27/24 0513 07/29/24 0623 07/30/24 0409  AST 41  --   --   --  31  --   ALT 19  --   --   --  33  --   ALKPHOS 50  --   --   --  93  --   BILITOT 0.4  --   --   --  0.4  --   PROT 6.3*  --   --   --  6.4*  --   ALBUMIN  2.0* 2.2* 1.9* 1.6* 1.8* 1.9*   No results for input(s): LIPASE, AMYLASE in the last 168 hours. No results for input(s): AMMONIA in the last 168 hours. Coagulation profile No results for input(s): INR, PROTIME in the last 168 hours. COVID-19 Labs  No results for input(s): DDIMER, FERRITIN, LDH, CRP in the last 72 hours.  Lab Results  Component Value Date   SARSCOV2NAA POSITIVE (A) 12/12/2020    CBC: Recent Labs  Lab 07/24/24 0439 07/25/24 1301 07/26/24 0905 07/27/24 0513 07/29/24 0623  WBC 9.4 13.8* 15.1* 11.6* 7.9  HGB 11.0* 12.8* 11.1* 10.6* 11.2*  HCT 34.9* 41.1 36.1* 34.2* 35.9*  MCV 79.1* 79.2* 80.8 80.1 79.2*  PLT 304 421* 361 311 387   Cardiac Enzymes: Recent Labs  Lab 07/24/24 0439  07/25/24 0300 07/26/24 0715 07/29/24 0623  CKTOTAL 696* 1,150* 1,149* 186   BNP (last 3 results) No results for input(s): PROBNP in the last 8760 hours. CBG: Recent Labs  Lab 07/29/24 0745 07/29/24 1244 07/29/24 1712 07/29/24 2105 07/30/24 0748  GLUCAP 133* 169* 104* 168* 146*   D-Dimer: No results for input(s): DDIMER in the last 72 hours. Hgb A1c: No results for input(s): HGBA1C in the last 72 hours. Lipid Profile: No results for input(s): CHOL, HDL, LDLCALC, TRIG, CHOLHDL, LDLDIRECT in the last 72 hours. Thyroid  function studies: No results for  input(s): TSH, T4TOTAL, T3FREE, THYROIDAB in the last 72 hours.  Invalid input(s): FREET3 Anemia work up: No results for input(s): VITAMINB12, FOLATE, FERRITIN, TIBC, IRON, RETICCTPCT in the last 72 hours. Sepsis Labs: Recent Labs  Lab 07/25/24 1301 07/26/24 0905 07/27/24 0513 07/29/24 0623  WBC 13.8* 15.1* 11.6* 7.9   Microbiology No results found for this or any previous visit (from the past 240 hours).   Medications:    (feeding supplement) PROSource Plus  30 mL Oral BID BM   apixaban   5 mg Oral BID   aspirin  EC  81 mg Oral Daily   docusate sodium   100 mg Oral Daily   feeding supplement  237 mL Oral TID BM   insulin  aspart  0-9 Units Subcutaneous TID WC   metoprolol  tartrate  12.5 mg Oral BID   multivitamin with minerals  1 tablet Oral Daily   nutrition supplement (JUVEN)  1 packet Oral BID BM   rosuvastatin   20 mg Oral Daily   senna-docusate  2 tablet Oral BID   sodium chloride  flush  3 mL Intravenous Q12H   Continuous Infusions:    LOS: 12 days   Karl [name]  [facility]  07/30/2024, 10:13 AM

## 2024-07-30 NOTE — TOC Progression Note (Addendum)
 Transition of Care [facility]) - Progression Note    Patient Details  Name: Karl [name] MRN: [id number] Date of Birth: [birth year]  [facility] CM/SW Contact  Isaiah Public, LCSWA Phone Number: 07/30/2024, 8:46 AM  Clinical Narrative:      Patients insurance authorization for SNF currently pending. CSW LVM for patients daughter [name]. CSW awaiting call back to discuss SNF bed offers.CSW will continue to follow.  Update- CSW received call back from patients daughter [name] and provided SNF bed offers for patient. Patients daughter [name] informed CSW that patient comes from home with sister and that plan after short term rehab is for patient to return back home with his sister.[name] accepted SNF bed offer with [facility]. [place] must website for passr currently down. CSW will follow up on patients passr once [place] must system is back up. CSW informed Anagela CMA patients facility choice is [facility]. Jon confirmed she will add patients facility choice to patients insurance authorization. CSW will continue to follow.  Patients passr number is [phone]. Patient has SN[facility] bed at [facility]. Insurance authorization pending for SNF.  Update- Jon CMA informed CSW that patients insurance authorization has been approved for SNF. Insurance authorization approved 8/27 - 8/29 NRD 8/29 Plan Auth PI#[id number] . [facility] with [facility] informed CSW that facility can accept patient tomorrow if medically stable. CSW informed MD.   Expected Discharge Plan: Home/Self Care Barriers to Discharge: Continued Medical Work up               Expected Discharge Plan and Services   Discharge Planning Services: CM Consult   Living arrangements for the past 2 months: Apartment                                       Social Drivers of Health (SDOH) Interventions SDOH Screenings   Food Insecurity: No Food Insecurity (07/18/2024)  Housing: Low Risk  (07/18/2024)  Transportation Needs:  No Transportation Needs (07/18/2024)  Utilities: At Risk (07/18/2024)  Depression (PHQ2-9): Low Risk  (03/31/2024)  Social Connections: Socially Isolated (07/18/2024)  Tobacco Use: Medium Risk (07/23/2024)    Readmission Risk Interventions    07/03/2022   12:58 PM  Readmission Risk Prevention Plan  Post Dischage Appt Complete  Medication Screening Complete  Transportation Screening Complete

## 2024-07-31 ENCOUNTER — Other Ambulatory Visit (HOSPITAL_COMMUNITY): Payer: Self-pay

## 2024-07-31 DIAGNOSIS — Z4781 Encounter for orthopedic aftercare following surgical amputation: Secondary | ICD-10-CM | POA: Diagnosis not present

## 2024-07-31 DIAGNOSIS — N183 Chronic kidney disease, stage 3 unspecified: Secondary | ICD-10-CM | POA: Diagnosis not present

## 2024-07-31 DIAGNOSIS — I998 Other disorder of circulatory system: Secondary | ICD-10-CM | POA: Diagnosis not present

## 2024-07-31 DIAGNOSIS — R41841 Cognitive communication deficit: Secondary | ICD-10-CM | POA: Diagnosis not present

## 2024-07-31 DIAGNOSIS — I2510 Atherosclerotic heart disease of native coronary artery without angina pectoris: Secondary | ICD-10-CM | POA: Diagnosis not present

## 2024-07-31 DIAGNOSIS — L89152 Pressure ulcer of sacral region, stage 2: Secondary | ICD-10-CM | POA: Diagnosis not present

## 2024-07-31 DIAGNOSIS — I69354 Hemiplegia and hemiparesis following cerebral infarction affecting left non-dominant side: Secondary | ICD-10-CM | POA: Diagnosis not present

## 2024-07-31 DIAGNOSIS — E1122 Type 2 diabetes mellitus with diabetic chronic kidney disease: Secondary | ICD-10-CM | POA: Diagnosis not present

## 2024-07-31 DIAGNOSIS — R2689 Other abnormalities of gait and mobility: Secondary | ICD-10-CM | POA: Diagnosis not present

## 2024-07-31 DIAGNOSIS — M62261 Nontraumatic ischemic infarction of muscle, right lower leg: Secondary | ICD-10-CM | POA: Diagnosis not present

## 2024-07-31 DIAGNOSIS — E1151 Type 2 diabetes mellitus with diabetic peripheral angiopathy without gangrene: Secondary | ICD-10-CM | POA: Diagnosis not present

## 2024-07-31 DIAGNOSIS — R531 Weakness: Secondary | ICD-10-CM | POA: Diagnosis not present

## 2024-07-31 DIAGNOSIS — I69998 Other sequelae following unspecified cerebrovascular disease: Secondary | ICD-10-CM | POA: Diagnosis not present

## 2024-07-31 DIAGNOSIS — E43 Unspecified severe protein-calorie malnutrition: Secondary | ICD-10-CM | POA: Diagnosis not present

## 2024-07-31 DIAGNOSIS — I7090 Unspecified atherosclerosis: Secondary | ICD-10-CM | POA: Diagnosis not present

## 2024-07-31 DIAGNOSIS — Z89611 Acquired absence of right leg above knee: Secondary | ICD-10-CM | POA: Diagnosis not present

## 2024-07-31 DIAGNOSIS — G8110 Spastic hemiplegia affecting unspecified side: Secondary | ICD-10-CM | POA: Diagnosis not present

## 2024-07-31 DIAGNOSIS — I110 Hypertensive heart disease with heart failure: Secondary | ICD-10-CM | POA: Diagnosis not present

## 2024-07-31 DIAGNOSIS — I5022 Chronic systolic (congestive) heart failure: Secondary | ICD-10-CM | POA: Diagnosis not present

## 2024-07-31 DIAGNOSIS — M6281 Muscle weakness (generalized): Secondary | ICD-10-CM | POA: Diagnosis not present

## 2024-07-31 DIAGNOSIS — I255 Ischemic cardiomyopathy: Secondary | ICD-10-CM | POA: Diagnosis not present

## 2024-07-31 DIAGNOSIS — R2681 Unsteadiness on feet: Secondary | ICD-10-CM | POA: Diagnosis not present

## 2024-07-31 DIAGNOSIS — R1311 Dysphagia, oral phase: Secondary | ICD-10-CM | POA: Diagnosis not present

## 2024-07-31 DIAGNOSIS — N400 Enlarged prostate without lower urinary tract symptoms: Secondary | ICD-10-CM | POA: Diagnosis not present

## 2024-07-31 DIAGNOSIS — E785 Hyperlipidemia, unspecified: Secondary | ICD-10-CM | POA: Diagnosis not present

## 2024-07-31 DIAGNOSIS — I69351 Hemiplegia and hemiparesis following cerebral infarction affecting right dominant side: Secondary | ICD-10-CM | POA: Diagnosis not present

## 2024-07-31 DIAGNOSIS — R7989 Other specified abnormal findings of blood chemistry: Secondary | ICD-10-CM | POA: Diagnosis not present

## 2024-07-31 LAB — GLUCOSE, CAPILLARY: Glucose-Capillary: 135 mg/dL — ABNORMAL HIGH (ref 70–99)

## 2024-07-31 MED ORDER — ASPIRIN 81 MG PO TBEC
81.0000 mg | DELAYED_RELEASE_TABLET | Freq: Every day | ORAL | 12 refills | Status: AC
  Filled 2024-07-31: qty 30, 30d supply, fill #0

## 2024-07-31 MED ORDER — METOPROLOL TARTRATE 25 MG PO TABS: 12.5000 mg | ORAL_TABLET | Freq: Two times a day (BID) | ORAL | Status: DC

## 2024-07-31 NOTE — TOC Transition Note (Signed)
 Transition of Care [facility]) - Discharge Note   Patient Details  Name: Karl [name] MRN: [id number] Date of Birth: 1956-09-20  Transition of Care [facility]) CM/SW Contact:  Isaiah Public, LCSWA Phone Number: 07/31/2024, 10:10 AM   Clinical Narrative:     Patient will DC to: [facility]  Anticipated DC date: 07/31/2024  Family notified: [name]   Transport by: [facility]  ?  Per MD patient ready for DC to [facility] . RN, patient, patient's family, and facility notified of DC. Discharge Summary sent to facility. RN given number for report [phone] RM# [id number]. DC packet on chart. Ambulance transport requested for patient.  CSW signing off.   Final next level of care: Skilled Nursing Facility Barriers to Discharge: No Barriers Identified   Patient Goals and CMS Choice Patient states their goals for this hospitalization and ongoing recovery are:: SNF   Choice offered to / list presented to : Adult Children (daughter [name])      Discharge Placement              Patient chooses bed at: [facility] Patient to be transferred to facility by: [facility] Name of family member notified: [name] Patient and family notified of of transfer: 07/31/24  Discharge Plan and Services Additional resources added to the After Visit Summary for     Discharge Planning Services: CM Consult                                 Social Drivers of Health (SDOH) Interventions SDOH Screenings   Food Insecurity: No Food Insecurity (07/18/2024)  Housing: Low Risk  (07/18/2024)  Transportation Needs: No Transportation Needs (07/18/2024)  Utilities: At Risk (07/18/2024)  Depression (PHQ2-9): Low Risk  (03/31/2024)  Social Connections: Socially Isolated (07/18/2024)  Tobacco Use: Medium Risk (07/23/2024)     Readmission Risk Interventions    07/03/2022   12:58 PM  Readmission Risk Prevention Plan  Post Dischage Appt Complete  Medication Screening Complete  Transportation Screening Complete

## 2024-07-31 NOTE — Discharge Summary (Signed)
 Physician Discharge Summary  Karl [name] FMW:[id number] DOB: 1956/09/19 DOA: 07/17/2024  PCP: Geofm Glade PARAS, MD  Admit date: 07/17/2024 Discharge date: 07/31/2024  Admitted From: Home Disposition:  SNF  Recommendations for Outpatient Follow-up:  Follow up with PCP in 1-2 weeks Please obtain BMP/CBC in one week   Home Health:No Equipment/Devices:None  Discharge Condition:Stable CODE STATUS:Full Diet recommendation: Heart Healthy  Brief/Interim Summary:  68 y.o. 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  PT evaluated the patient awaiting insurance authorization for skilled nursing facility placement   Discharge Diagnoses:  Principal Problem:   Ischemic foot Active Problems:   Essential hypertension   Chronic systolic heart failure (HCC)   Diabetes (HCC)   History of CVA (cerebrovascular accident)   Protein-calorie malnutrition, severe  Right lower extremity/chronic limb threatening ischemia: Underwent angiogram that showed poor perfusion vascular recommended AKA done on 07/23/2024. He is for staple removal 09/01/2024. PT evaluated the patient will need skilled nursing facility.  HFpEF: Continue Jardiance  and metoprolol  holding ARB. He is not on a diuretic at home. Last 2D echo showed an EF of 25%. He will follow-up with cardiology as an outpatient start ARB as tolerated.  History of coronary artery disease:  noted  History of CVA: Continue Eliquis .  Diabetes mellitus type 2 with hyperglycemia: No changes made to his medication, A1c was  5.9.  Sinus tachycardia: On metoprolol  now resolved.  Hyperlipidemia: continue statins.  Leukocytosis: Likely reactive  Severe protein close malnutrition: Noted. Discharge Instructions  Discharge Instructions     AMB Referral to [facility]   Complete by: As directed    Reason For Referral: Lipids   Diet - low sodium heart healthy   Complete by: As directed    Increase activity slowly   Complete by: As directed    No wound care   Complete by: As directed       Allergies as of 07/31/2024       Reactions   Lipitor [atorvastatin] Nausea And Vomiting        Medication List     STOP taking these medications    losartan  25 MG tablet Commonly known as: COZAAR        TAKE these medications    acetaminophen  325 MG tablet Commonly known as: TYLENOL  Take 1-2 tablets (325-650 mg total) by mouth every 4 (four) hours as needed for mild pain. What changed:  how much to take when to take this reasons to take this   apixaban  5 MG Tabs tablet Commonly known as: ELIQUIS  Take 1 tablet (5 mg total) by mouth 2 (two) times daily. Needs Cardiology appt for refills.  Call office [phone].  Thank you..Take 1 tablet (5 mg total) by mouth 2 (two) times daily. What changed: additional instructions   aspirin  EC 81 MG tablet Take 1 tablet (81 mg total) by mouth daily. Swallow whole. Start taking on: August 01, 2024   carvedilol  3.125 MG tablet Commonly known as: COREG  TAKE 1 TABLET BY MOUTH TWICE DAILY WITH A MEAL   empagliflozin  10 MG Tabs tablet Commonly known as: Jardiance  Take 1 tablet (10 mg total)  by mouth daily before breakfast.   Lancets 30G Misc Use to check blood sugars daily   metFORMIN  1000 MG tablet Commonly known as: GLUCOPHAGE  Take 1 tablet (1,000 mg total) by mouth 2 (two) times daily with a meal.   metoprolol  tartrate 25 MG tablet Commonly known as: LOPRESSOR  Take 0.5 tablets (12.5 mg total) by mouth 2 (two) times daily.   potassium  chloride 10 MEQ tablet Commonly known as: KLOR-CON  Take 10 mEq by mouth 2 (two) times daily.   rosuvastatin  20 MG tablet Commonly known as: CRESTOR  Take 1 tablet (20 mg total) by mouth daily.        Contact information for follow-up providers     Burns, Glade PARAS, MD Follow up.   Specialty: Internal Medicine Contact information: [address] [place] [address] [phone]              Contact information for after-discharge care     Destination     [facility] [place] .   Service: Skilled Nursing Contact information: [address] [place]  [address] [phone]                    Allergies  Allergen Reactions   Lipitor [Atorvastatin] Nausea And Vomiting    Consultations: Cardiology   Procedures/Studies: PERIPHERAL VASCULAR CATHETERIZATION Result Date: 07/21/2024 Images from the original result were not included.   Patient name: Karl [name]   MRN: [id number]        DOB: [birth year]        Sex: male  07/21/2024 Pre-operative Diagnosis: Right lower extremity CL TI with rest pain Post-operative diagnosis:  Same Surgeon:  Norman GORMAN Serve, MD Procedure Performed:  Ultrasound-guided access of the left common femoral artery Aortogram and bilateral lower extremity angiogram Third order cannulation of right popliteal artery Mynx closure of the left common femoral artery 49 minutes of moderate sedation with fentanyl  and versed   Indications: Karl [name] is a 68 year old male with CL TI and rest pain on the right lower extremity.  He has a history of stroke with some mild right-sided weakness and has been on anticoagulation for A-fib.  He is diabetic and a former smoker.  A CTA demonstrated severe below the knee disease and his ABI on the right is 0.  Risk and benefits of angiogram with possible intervention were reviewed and he elected to proceed.  Findings: Widely patent aorta and bilateral renal arteries. Widely patent iliac  systems bilaterally.  Right common femoral, profunda and SFA are patent.  There is significant medial calcinosis of the SFA.  The SFA then occludes at the adductor canal.  The flow is extremely slow and the AT is reconstituted by collaterals.  A blind segment of TP trunk is reconstituted.  The AT does flow into the DP on the foot but is very sluggish.  Left common femoral, profunda and SFA are patent.  There is significant medial calcinosis.  The popliteal artery is patent.  There is two-vessel runoff via the peroneal and PT.  The peroneal reconstitutes the AT at the ankle.             Procedure:  The patient was identified in the holding area and taken to the cath lab  The patient was then placed supine on the table and prepped and draped in the usual sterile fashion.  A time out was called.  Ultrasound was used to evaluate the left common femoral artery.  It was patent .  A digital ultrasound image was acquired.  A micropuncture needle was used to access the left common femoral artery under ultrasound guidance.  An 018 wire was advanced without resistance and a micropuncture sheath was placed.  The 018 wire was removed and a benson wire was placed.  The micropuncture sheath was exchanged for a 5 french sheath.  An omniflush catheter was advanced over the wire to the level of L-1.  An abdominal angiogram was obtained.  Next, using the omniflush catheter and a Bentson wire, the aortic bifurcation was crossed and the catheter was placed into theright external iliac artery and right runoff was obtained. This demonstrated the above findings.  A glide advantage wire was then placed in the catheter into the distal SFA.  The short 5 French sheath was then exchanged for a 5 [place] by 65 cm catapult sheath.  The patient was then systemically heparinized and direct injections in the SFA were used to better visualize the arteries below the knee which demonstrated the above findings.  I attempted to cross the occlusion with  the glide advantage wire and a quick cross catheter although this was unsuccessful.  I also attempted to cross with an 014 command wire and CXI catheter although this was also unsuccessful and I was unable to reenter into the AT.  The long 5 French sheath was exchanged for a short 5 French sheath and left runoff was performed via retrograde sheath injections which demonstrated the above findings.  A minx closure device was then deployed at the left femoral artery access.  Upon removal he did start to develop some hematoma and therefore 15 minutes of manual pressure was held and he will be on bedrest for 4 hours.  Contrast: 80 cc Sedation: 49 minutes   Norman GORMAN Serve MD [facility] Office: [phone]   VAS US  ABI WITH/WO TBI Result Date: 07/21/2024  LOWER EXTREMITY DOPPLER STUDY Patient Name:  Karl [name]  Date of Exam:   07/20/2024 Medical Rec #: [id number]      Accession #:    [id number] Date of Birth: 04/16/1956     Patient Gender: M Patient Age:   86 years Exam [facility]  [facility] Procedure:      VAS US  ABI WITH/WO TBI Referring Phys: PENNE COL[place]O --------------------------------------------------------------------------------  Indications: Right lower extremity rest pain, foot cold to touch, and weak. High Risk Factors: Diabetes, past history of smoking, coronary artery disease,                    prior CVA. Other Factors: Cardiomyopathy.  Comparison Study: No prior study on file Performing Technologist: Rachel Pellet RVS  Examination Guidelines: A complete evaluation includes at minimum, Doppler waveform signals and systolic blood pressure reading at the level of bilateral brachial, anterior tibial, and posterior tibial arteries, when vessel segments are accessible. Bilateral testing is considered an integral part of a complete examination. Photoelectric Plethysmograph (PPG) waveforms and toe systolic pressure readings are included as required and additional  duplex testing as needed. Limited examinations for reoccurring indications may be performed as noted.  ABI Findings: +---------+------------------+-----+---------+--------+ Right    Rt Pressure (mmHg)IndexWaveform Comment  +---------+------------------+-----+---------+--------+ Brachial 115                    triphasic         +---------+------------------+-----+---------+--------+ PTA      0  0.00 absent            +---------+------------------+-----+---------+--------+ DP       0                 0.00 absent            +---------+------------------+-----+---------+--------+ Great Toe0                 0.00 Absent            +---------+------------------+-----+---------+--------+ +---------+------------------+-----+---------+-------+ Left     Lt Pressure (mmHg)IndexWaveform Comment +---------+------------------+-----+---------+-------+ Brachial 113                    triphasic        +---------+------------------+-----+---------+-------+ PTA      180               1.57 triphasic        +---------+------------------+-----+---------+-------+ DP       139               1.21 triphasic        +---------+------------------+-----+---------+-------+ Great Toe41                0.36 Abnormal         +---------+------------------+-----+---------+-------+ +-------+---------------------+-----------+------------+------------+ ABI/TBIToday's ABI          Today's TBIPrevious ABIPrevious TBI +-------+---------------------+-----------+------------+------------+ Right  Absent               Absent                              +-------+---------------------+-----------+------------+------------+ Left   1.57 non compressible0.36                                +-------+---------------------+-----------+------------+------------+  Summary: Right: Resting right ankle-brachial index indicates critical limb ischemia. Left: Resting left ankle-brachial  index indicates noncompressible left lower extremity arteries. The left toe-brachial index is abnormal. *See table(s) above for measurements and observations.  Electronically signed by Gaile New MD on 07/21/2024 at 11:46:02 AM.    Final    MR BRAIN WO CONTRAST Result Date: 07/18/2024 EXAM: MRI BRAIN WITHOUT CONTRAST 07/18/2024 07:31:00 AM TECHNIQUE: Multiplanar multisequence MRI of the head/brain was performed without the administration of intravenous contrast. COMPARISON: MRI head 10/04/2021. CLINICAL HISTORY: Neuro deficit, acute, stroke suspected. Left sided weakness. FINDINGS: BRAIN AND VENTRICLES: No acute infarct. No intracranial hemorrhage. No mass. No midline shift. No hydrocephalus. The sella is unremarkable. A chronic right PCA (posterior cerebral artery) infarct is again noted involving portions of the right temporal and occipital lobes and right thalamus. There is also a small chronic infarct in the posterior left insula, and there are chronic bilateral cerebellar infarcts which are small on the right and moderately large on the left. Small T2 hyperintensities elsewhere in the cerebral white matter bilaterally are nonspecific but compatible with minimal chronic small vessel ischemic disease. There is mild cerebral atrophy. Normal flow voids. ORBITS: No acute abnormality. SINUSES AND MASTOIDS: Minimal mucosal thickening in the paranasal sinuses. Trace right mastoid fluid. BONES AND SOFT TISSUES: Normal marrow signal. No acute soft tissue abnormality. IMPRESSION: 1. No acute intracranial abnormality. 2. Chronic ischemia including multiple old posterior circulation infarcts as above. Electronically signed by: Dasie Hamburg MD 07/18/2024 07:46 AM EDT RP Workstation: [id number]   CT Angio Aortobifemoral W and/or Wo Contrast Result Date: 07/17/2024 CLINICAL DATA:  Right leg pain  and swelling with absent distal pulses, no known injury, initial encounter EXAM: CT ANGIOGRAPHY OF ABDOMINAL AORTA WITH  ILIOFEMORAL RUNOFF TECHNIQUE: Multidetector CT imaging of the abdomen, pelvis and lower extremities was performed using the standard protocol during bolus administration of intravenous contrast. Multiplanar CT image reconstructions and MIPs were obtained to evaluate the vascular anatomy. RADIATION DOSE REDUCTION: This exam was performed according to the departmental dose-optimization program which includes automated exposure control, adjustment of the mA and/or kV according to patient size and/or use of iterative reconstruction technique. CONTRAST:  OMNIPAQUE  IOHEXOL  350 MG/ML SOLN COMPARISON:  None Available. FINDINGS: VASCULAR Aorta: Atherosclerotic calcifications of the aorta are seen. Celiac: Patent without evidence of aneurysm, dissection, vasculitis or significant stenosis. 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 There is approximately a 15 mm short segment occlusion identified prior to reconstitution of the distal SMA branches via collaterals. Renals: Dual renal arteries are identified bilaterally. IMA: IMA is hypertrophied in part related to distal reconstitution of the SMA branches. RIGHT Lower Extremity Inflow: Common and external iliac artery are within normal limits. Common femoral artery demonstrates mild calcification without focal stenosis. Runoff: Superficial femoral artery demonstrates atherosclerotic calcifications. Some areas of mild stenosis are seen although there is apparent occlusion in the mid popliteal artery. Multiple muscular collaterals are identified in the calf. The infrapopliteal vessels are heavily calcified. Very mild segmental reconstitution of the anterior tibial and posterior tibial arteries is seen. Minimal visualization into the foot is seen. LEFT Lower Extremity Inflow: Atherosclerotic calcifications are noted in the common and external iliac artery. Common femoral artery and its bifurcation are within normal limits. Runoff: Superficial femoral artery  demonstrates atherosclerotic calcifications similar to that seen on the right. The degree of opacification is limited in the distal superficial femoral artery and popliteal artery. Multiple muscular collaterals are noted similar to that seen on the right with minimal short-segment reconstitution of primarily the peroneal artery. Minimal reconstitution of the left dorsalis pedis is seen. Veins: No specific venous abnormality is noted. Review of the MIP images confirms the above findings. NON-VASCULAR Lower chest: No acute abnormality. Hepatobiliary: Hypodensity is noted in the central aspect of the liver which corresponds to previously seen cystic change. Gallbladder is within normal limits. Pancreas: Unremarkable. No pancreatic ductal dilatation or surrounding inflammatory changes. Spleen: Normal in size without focal abnormality. Adrenals/Urinary Tract: Adrenal glands are within normal limits. Kidneys demonstrate patchy decreased enhancement particularly on the left. This may simply be related to the timing of the contrast bolus. Possibility of renal infarct or inflammatory change deserves consideration. No obstructive changes are seen. The bladder is well distended. Stomach/Bowel: Scattered fecal material is noted throughout the colon. No obstructive or inflammatory changes are noted. The appendix is within normal limits. Small bowel and stomach are unremarkable. Lymphatic: No lymphadenopathy is noted. Reproductive: Prostate is significantly enlarged indenting upon the bladder. Other: No abdominal wall hernia or abnormality. No abdominopelvic ascites. Musculoskeletal: Degenerative changes of lumbar spine are noted. No acute bony abnormality is seen. 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 IMA hypertrophy related to distal reconstitution of the SMA. Poor visualization of the distal superficial femoral and popliteal arteries bilaterally likely related to distal occlusion. Heavy  atherosclerotic calcifications are noted in the infrapopliteal vessels bilaterally. Mild reconstitution via muscular collaterals is noted on the right to the anterior and posterior tibial arteries. Reconstitution on the left is to mainly the peroneal artery. NON-VASCULAR Prominent prostate. Patchy decreased enhancement within the left  kidney. This is of uncertain etiology but could be related to small focal infarct or inflammatory change in the kidney. Correlate with laboratory values. Electronically Signed   By: Oneil Devonshire M.D.   On: 07/17/2024 23:12   (Echo, Carotid, EGD, Colonoscopy, ERCP)    Subjective: No complaints  Discharge Exam: Vitals:   07/31/24 0354 07/31/24 0738  BP: 110/69 119/74  Pulse: 70 83  Resp: 16 17  Temp: 97.9 F (36.6 C) 97.9 F (36.6 C)  SpO2: 99% 99%   Vitals:   07/30/24 2034 07/30/24 2335 07/31/24 0354 07/31/24 0738  BP: 115/77 128/86 110/69 119/74  Pulse: 79 75 70 83  Resp:  18 16 17   Temp: 98.5 F (36.9 C) 97.6 F (36.4 C) 97.9 F (36.6 C) 97.9 F (36.6 C)  TempSrc: Oral Axillary Oral Oral  SpO2: 98% 100% 99% 99%  Weight:      Height:        General: Pt is alert, awake, not in acute distress Cardiovascular: RRR, S1/S2 +, no rubs, no gallops Respiratory: CTA bilaterally, no wheezing, no rhonchi Abdominal: Soft, NT, ND, bowel sounds + Extremities: no edema, no cyanosis    The results of significant diagnostics from this hospitalization (including imaging, microbiology, ancillary and laboratory) are listed below for reference.     Microbiology: No results found for this or any previous visit (from the past 240 hours).   Labs: BNP (last 3 results) No results for input(s): BNP in the last 8760 hours. Basic Metabolic Panel: Recent Labs  Lab 07/25/24 0300 07/26/24 0715 07/27/24 0513 07/29/24 0623 07/30/24 0409  NA 132* 133* 132* 129* 131*  K 4.5 4.3 4.2 4.4 4.1  CL 98 101 100 96* 98  CO2 27 26 27 27 27   GLUCOSE 176* 131* 105*  134* 134*  BUN 26* 31* 25* 25* 25*  CREATININE 1.17 1.07 1.01 0.96 0.91  CALCIUM  9.0 8.7* 8.5* 8.8* 8.6*  MG 2.2 2.2 1.8 1.9 2.1  PHOS 1.5* 2.8 2.4*  --  2.4*   Liver Function Tests: Recent Labs  Lab 07/25/24 0300 07/26/24 0715 07/27/24 0513 07/29/24 0623 07/30/24 0409  AST  --   --   --  31  --   ALT  --   --   --  33  --   ALKPHOS  --   --   --  93  --   BILITOT  --   --   --  0.4  --   PROT  --   --   --  6.4*  --   ALBUMIN  2.2* 1.9* 1.6* 1.8* 1.9*   No results for input(s): LIPASE, AMYLASE in the last 168 hours. No results for input(s): AMMONIA in the last 168 hours. CBC: Recent Labs  Lab 07/25/24 1301 07/26/24 0905 07/27/24 0513 07/29/24 0623  WBC 13.8* 15.1* 11.6* 7.9  HGB 12.8* 11.1* 10.6* 11.2*  HCT 41.1 36.1* 34.2* 35.9*  MCV 79.2* 80.8 80.1 79.2*  PLT 421* 361 311 387   Cardiac Enzymes: Recent Labs  Lab 07/25/24 0300 07/26/24 0715 07/29/24 0623  CKTOTAL 1,150* 1,149* 186   BNP: Invalid input(s): POCBNP CBG: Recent Labs  Lab 07/30/24 0748 07/30/24 1202 07/30/24 1544 07/30/24 2033 07/31/24 0741  GLUCAP 146* 186* 132* 166* 135*   D-Dimer No results for input(s): DDIMER in the last 72 hours. Hgb A1c No results for input(s): HGBA1C in the last 72 hours. Lipid Profile No results for input(s): CHOL, HDL, LDLCALC, TRIG, CHOLHDL, LDLDIRECT in the  last 72 hours. Thyroid  function studies No results for input(s): TSH, T4TOTAL, T3FREE, THYROIDAB in the last 72 hours.  Invalid input(s): FREET3 Anemia work up No results for input(s): VITAMINB12, FOLATE, FERRITIN, TIBC, IRON, RETICCTPCT in the last 72 hours. Urinalysis    Component Value Date/Time   COLORURINE YELLOW 06/29/2022 1821   APPEARANCEUR CLOUDY (A) 06/29/2022 1821   LABSPEC 1.029 06/29/2022 1821   PHURINE 6.0 06/29/2022 1821   GLUCOSEU >=500 (A) 06/29/2022 1821   HGBUR MODERATE (A) 06/29/2022 1821   BILIRUBINUR NEGATIVE 06/29/2022 1821    BILIRUBINUR moderate 07/09/2014 1428   KETONESUR 20 (A) 06/29/2022 1821   PROTEINUR 30 (A) 06/29/2022 1821   UROBILINOGEN 0.2 08/07/2015 0854   NITRITE NEGATIVE 06/29/2022 1821   LEUKOCYTESUR LARGE (A) 06/29/2022 1821   Sepsis Labs Recent Labs  Lab 07/25/24 1301 07/26/24 0905 07/27/24 0513 07/29/24 0623  WBC 13.8* 15.1* 11.6* 7.9   Microbiology No results found for this or any previous visit (from the past 240 hours).   Time coordinating discharge: Over 35 minutes  SIGNED:   Erle Odell Castor, MD  [facility] 07/31/2024, 9:03 AM Pager   If 7PM-7AM, please contact night-coverage [email or web address] Password [email or web address]

## 2024-07-31 NOTE — TOC Progression Note (Signed)
 Transition of Care [facility]) - Progression Note    Patient Details  Name: ARGUS CARAHER MRN: [id number] Date of Birth: January 15, 1956  Transition of [facility] CM/SW Contact  Isaiah Public, LCSWA Phone Number: 07/31/2024, 10:00 AM  Clinical Narrative:     Star with [facility] confirmed patient can dc over today if medically ready. CSW will continue to follow.  Expected Discharge Plan: Home/Self Care Barriers to Discharge: Continued Medical Work up               Expected Discharge Plan and Services   Discharge Planning Services: CM Consult   Living arrangements for the past 2 months: Apartment Expected Discharge Date: 07/31/24                                     Social Drivers of Health (SDOH) Interventions SDOH Screenings   Food Insecurity: No Food Insecurity (07/18/2024)  Housing: Low Risk  (07/18/2024)  Transportation Needs: No Transportation Needs (07/18/2024)  Utilities: At Risk (07/18/2024)  Depression (PHQ2-9): Low Risk  (03/31/2024)  Social Connections: Socially Isolated (07/18/2024)  Tobacco Use: Medium Risk (07/23/2024)    Readmission Risk Interventions    07/03/2022   12:58 PM  Readmission Risk Prevention Plan  Post Dischage Appt Complete  Medication Screening Complete  Transportation Screening Complete

## 2024-07-31 NOTE — Progress Notes (Signed)
 Attempted to call report but un able to report to transfer facility, EMS personnel will be given report.

## 2024-08-05 DIAGNOSIS — N183 Chronic kidney disease, stage 3 unspecified: Secondary | ICD-10-CM | POA: Diagnosis not present

## 2024-08-05 DIAGNOSIS — R531 Weakness: Secondary | ICD-10-CM | POA: Diagnosis not present

## 2024-08-05 DIAGNOSIS — E43 Unspecified severe protein-calorie malnutrition: Secondary | ICD-10-CM | POA: Diagnosis not present

## 2024-08-05 DIAGNOSIS — I69354 Hemiplegia and hemiparesis following cerebral infarction affecting left non-dominant side: Secondary | ICD-10-CM | POA: Diagnosis not present

## 2024-08-05 DIAGNOSIS — I998 Other disorder of circulatory system: Secondary | ICD-10-CM | POA: Diagnosis not present

## 2024-08-05 DIAGNOSIS — N400 Enlarged prostate without lower urinary tract symptoms: Secondary | ICD-10-CM | POA: Diagnosis not present

## 2024-08-05 DIAGNOSIS — E1151 Type 2 diabetes mellitus with diabetic peripheral angiopathy without gangrene: Secondary | ICD-10-CM | POA: Diagnosis not present

## 2024-08-05 DIAGNOSIS — I2510 Atherosclerotic heart disease of native coronary artery without angina pectoris: Secondary | ICD-10-CM | POA: Diagnosis not present

## 2024-08-05 DIAGNOSIS — L89152 Pressure ulcer of sacral region, stage 2: Secondary | ICD-10-CM | POA: Diagnosis not present

## 2024-08-05 DIAGNOSIS — E785 Hyperlipidemia, unspecified: Secondary | ICD-10-CM | POA: Diagnosis not present

## 2024-08-06 DIAGNOSIS — E43 Unspecified severe protein-calorie malnutrition: Secondary | ICD-10-CM | POA: Diagnosis not present

## 2024-08-06 DIAGNOSIS — R2681 Unsteadiness on feet: Secondary | ICD-10-CM | POA: Diagnosis not present

## 2024-08-06 DIAGNOSIS — Z89611 Acquired absence of right leg above knee: Secondary | ICD-10-CM | POA: Diagnosis not present

## 2024-08-06 DIAGNOSIS — M6281 Muscle weakness (generalized): Secondary | ICD-10-CM | POA: Diagnosis not present

## 2024-08-06 DIAGNOSIS — I69998 Other sequelae following unspecified cerebrovascular disease: Secondary | ICD-10-CM | POA: Diagnosis not present

## 2024-08-06 DIAGNOSIS — G8110 Spastic hemiplegia affecting unspecified side: Secondary | ICD-10-CM | POA: Diagnosis not present

## 2024-08-08 DIAGNOSIS — G8110 Spastic hemiplegia affecting unspecified side: Secondary | ICD-10-CM | POA: Diagnosis not present

## 2024-08-08 DIAGNOSIS — M6281 Muscle weakness (generalized): Secondary | ICD-10-CM | POA: Diagnosis not present

## 2024-08-08 DIAGNOSIS — Z89611 Acquired absence of right leg above knee: Secondary | ICD-10-CM | POA: Diagnosis not present

## 2024-08-08 DIAGNOSIS — R2681 Unsteadiness on feet: Secondary | ICD-10-CM | POA: Diagnosis not present

## 2024-08-08 DIAGNOSIS — I69998 Other sequelae following unspecified cerebrovascular disease: Secondary | ICD-10-CM | POA: Diagnosis not present

## 2024-08-08 DIAGNOSIS — E43 Unspecified severe protein-calorie malnutrition: Secondary | ICD-10-CM | POA: Diagnosis not present

## 2024-08-11 DIAGNOSIS — I5022 Chronic systolic (congestive) heart failure: Secondary | ICD-10-CM | POA: Diagnosis not present

## 2024-08-11 DIAGNOSIS — R2681 Unsteadiness on feet: Secondary | ICD-10-CM | POA: Diagnosis not present

## 2024-08-11 DIAGNOSIS — G8110 Spastic hemiplegia affecting unspecified side: Secondary | ICD-10-CM | POA: Diagnosis not present

## 2024-08-11 DIAGNOSIS — I255 Ischemic cardiomyopathy: Secondary | ICD-10-CM | POA: Diagnosis not present

## 2024-08-11 DIAGNOSIS — I110 Hypertensive heart disease with heart failure: Secondary | ICD-10-CM | POA: Diagnosis not present

## 2024-08-11 DIAGNOSIS — N400 Enlarged prostate without lower urinary tract symptoms: Secondary | ICD-10-CM | POA: Diagnosis not present

## 2024-08-11 DIAGNOSIS — E1122 Type 2 diabetes mellitus with diabetic chronic kidney disease: Secondary | ICD-10-CM | POA: Diagnosis not present

## 2024-08-11 DIAGNOSIS — I69998 Other sequelae following unspecified cerebrovascular disease: Secondary | ICD-10-CM | POA: Diagnosis not present

## 2024-08-11 DIAGNOSIS — I69354 Hemiplegia and hemiparesis following cerebral infarction affecting left non-dominant side: Secondary | ICD-10-CM | POA: Diagnosis not present

## 2024-08-11 DIAGNOSIS — Z89611 Acquired absence of right leg above knee: Secondary | ICD-10-CM | POA: Diagnosis not present

## 2024-08-11 DIAGNOSIS — Z4781 Encounter for orthopedic aftercare following surgical amputation: Secondary | ICD-10-CM | POA: Diagnosis not present

## 2024-08-11 DIAGNOSIS — M6281 Muscle weakness (generalized): Secondary | ICD-10-CM | POA: Diagnosis not present

## 2024-08-11 DIAGNOSIS — E43 Unspecified severe protein-calorie malnutrition: Secondary | ICD-10-CM | POA: Diagnosis not present

## 2024-08-11 DIAGNOSIS — N183 Chronic kidney disease, stage 3 unspecified: Secondary | ICD-10-CM | POA: Diagnosis not present

## 2024-08-13 DIAGNOSIS — M6281 Muscle weakness (generalized): Secondary | ICD-10-CM | POA: Diagnosis not present

## 2024-08-13 DIAGNOSIS — Z89611 Acquired absence of right leg above knee: Secondary | ICD-10-CM | POA: Diagnosis not present

## 2024-08-13 DIAGNOSIS — E43 Unspecified severe protein-calorie malnutrition: Secondary | ICD-10-CM | POA: Diagnosis not present

## 2024-08-13 DIAGNOSIS — I69998 Other sequelae following unspecified cerebrovascular disease: Secondary | ICD-10-CM | POA: Diagnosis not present

## 2024-08-13 DIAGNOSIS — R2681 Unsteadiness on feet: Secondary | ICD-10-CM | POA: Diagnosis not present

## 2024-08-13 DIAGNOSIS — G8110 Spastic hemiplegia affecting unspecified side: Secondary | ICD-10-CM | POA: Diagnosis not present

## 2024-08-14 ENCOUNTER — Ambulatory Visit

## 2024-08-18 DIAGNOSIS — Z89611 Acquired absence of right leg above knee: Secondary | ICD-10-CM | POA: Diagnosis not present

## 2024-08-18 DIAGNOSIS — M6281 Muscle weakness (generalized): Secondary | ICD-10-CM | POA: Diagnosis not present

## 2024-08-18 DIAGNOSIS — I69998 Other sequelae following unspecified cerebrovascular disease: Secondary | ICD-10-CM | POA: Diagnosis not present

## 2024-08-18 DIAGNOSIS — E43 Unspecified severe protein-calorie malnutrition: Secondary | ICD-10-CM | POA: Diagnosis not present

## 2024-08-18 DIAGNOSIS — R2681 Unsteadiness on feet: Secondary | ICD-10-CM | POA: Diagnosis not present

## 2024-08-18 DIAGNOSIS — G8110 Spastic hemiplegia affecting unspecified side: Secondary | ICD-10-CM | POA: Diagnosis not present

## 2024-08-19 DIAGNOSIS — L89152 Pressure ulcer of sacral region, stage 2: Secondary | ICD-10-CM | POA: Diagnosis not present

## 2024-08-20 DIAGNOSIS — I2510 Atherosclerotic heart disease of native coronary artery without angina pectoris: Secondary | ICD-10-CM | POA: Diagnosis not present

## 2024-08-20 DIAGNOSIS — I7090 Unspecified atherosclerosis: Secondary | ICD-10-CM | POA: Diagnosis not present

## 2024-08-20 DIAGNOSIS — E1122 Type 2 diabetes mellitus with diabetic chronic kidney disease: Secondary | ICD-10-CM | POA: Diagnosis not present

## 2024-08-20 DIAGNOSIS — E1151 Type 2 diabetes mellitus with diabetic peripheral angiopathy without gangrene: Secondary | ICD-10-CM | POA: Diagnosis not present

## 2024-08-20 DIAGNOSIS — Z89611 Acquired absence of right leg above knee: Secondary | ICD-10-CM | POA: Diagnosis not present

## 2024-08-20 DIAGNOSIS — N183 Chronic kidney disease, stage 3 unspecified: Secondary | ICD-10-CM | POA: Diagnosis not present

## 2024-08-20 DIAGNOSIS — I5022 Chronic systolic (congestive) heart failure: Secondary | ICD-10-CM | POA: Diagnosis not present

## 2024-08-20 DIAGNOSIS — I69351 Hemiplegia and hemiparesis following cerebral infarction affecting right dominant side: Secondary | ICD-10-CM | POA: Diagnosis not present

## 2024-08-20 DIAGNOSIS — R7989 Other specified abnormal findings of blood chemistry: Secondary | ICD-10-CM | POA: Diagnosis not present

## 2024-08-22 DIAGNOSIS — E1122 Type 2 diabetes mellitus with diabetic chronic kidney disease: Secondary | ICD-10-CM | POA: Diagnosis not present

## 2024-08-22 DIAGNOSIS — I130 Hypertensive heart and chronic kidney disease with heart failure and stage 1 through stage 4 chronic kidney disease, or unspecified chronic kidney disease: Secondary | ICD-10-CM | POA: Diagnosis not present

## 2024-08-22 DIAGNOSIS — I69393 Ataxia following cerebral infarction: Secondary | ICD-10-CM | POA: Diagnosis not present

## 2024-08-22 DIAGNOSIS — I5022 Chronic systolic (congestive) heart failure: Secondary | ICD-10-CM | POA: Diagnosis not present

## 2024-08-22 DIAGNOSIS — Z4781 Encounter for orthopedic aftercare following surgical amputation: Secondary | ICD-10-CM | POA: Diagnosis not present

## 2024-08-22 DIAGNOSIS — N183 Chronic kidney disease, stage 3 unspecified: Secondary | ICD-10-CM | POA: Diagnosis not present

## 2024-08-22 DIAGNOSIS — G8110 Spastic hemiplegia affecting unspecified side: Secondary | ICD-10-CM | POA: Diagnosis not present

## 2024-08-22 DIAGNOSIS — I2510 Atherosclerotic heart disease of native coronary artery without angina pectoris: Secondary | ICD-10-CM | POA: Diagnosis not present

## 2024-08-22 DIAGNOSIS — Z89611 Acquired absence of right leg above knee: Secondary | ICD-10-CM | POA: Diagnosis not present

## 2024-08-25 ENCOUNTER — Telehealth: Payer: Self-pay

## 2024-08-25 NOTE — Telephone Encounter (Signed)
 Orders given to [place] today

## 2024-08-25 NOTE — Telephone Encounter (Signed)
 Okay for orders?

## 2024-08-25 NOTE — Telephone Encounter (Signed)
 Copied from CRM [id number]. Topic: Clinical - Home Health Verbal Orders >> Aug 25, 2024 10:00 AM Debby BROCKS wrote: Caller/Agency: Nola Males from [facility] Callback Number: [phone] Service Requested: Skilled Nursing Frequency: 1 week 9  2 Prns Any new concerns about the patient? Yes, staples are intact but there is a small 1 by 1 cm open but there are no signs on infection. They would like to treat it with Silver alginate

## 2024-08-27 DIAGNOSIS — I2510 Atherosclerotic heart disease of native coronary artery without angina pectoris: Secondary | ICD-10-CM | POA: Diagnosis not present

## 2024-08-27 DIAGNOSIS — I69393 Ataxia following cerebral infarction: Secondary | ICD-10-CM | POA: Diagnosis not present

## 2024-08-27 DIAGNOSIS — N183 Chronic kidney disease, stage 3 unspecified: Secondary | ICD-10-CM | POA: Diagnosis not present

## 2024-08-27 DIAGNOSIS — I130 Hypertensive heart and chronic kidney disease with heart failure and stage 1 through stage 4 chronic kidney disease, or unspecified chronic kidney disease: Secondary | ICD-10-CM | POA: Diagnosis not present

## 2024-08-27 DIAGNOSIS — Z89611 Acquired absence of right leg above knee: Secondary | ICD-10-CM | POA: Diagnosis not present

## 2024-08-27 DIAGNOSIS — I5022 Chronic systolic (congestive) heart failure: Secondary | ICD-10-CM | POA: Diagnosis not present

## 2024-08-27 DIAGNOSIS — G8110 Spastic hemiplegia affecting unspecified side: Secondary | ICD-10-CM | POA: Diagnosis not present

## 2024-08-27 DIAGNOSIS — Z4781 Encounter for orthopedic aftercare following surgical amputation: Secondary | ICD-10-CM | POA: Diagnosis not present

## 2024-08-27 DIAGNOSIS — E1122 Type 2 diabetes mellitus with diabetic chronic kidney disease: Secondary | ICD-10-CM | POA: Diagnosis not present

## 2024-08-28 NOTE — Telephone Encounter (Unsigned)
 Copied from CRM [id number]. Topic: Clinical - Home Health Verbal Orders >> Aug 28, 2024 10:29 AM Laymon HERO wrote: Caller/Agency: [facility] Callback Number: [phone] Service Requested: Physical Therapy Frequency: 1x8 Any new concerns about the patient? No

## 2024-08-28 NOTE — Telephone Encounter (Signed)
 Okay for orders?

## 2024-08-29 NOTE — Telephone Encounter (Signed)
 Verbals given to [place] this morning

## 2024-09-01 ENCOUNTER — Ambulatory Visit: Attending: Surgery | Admitting: Physician Assistant

## 2024-09-01 ENCOUNTER — Other Ambulatory Visit: Payer: Self-pay | Admitting: Internal Medicine

## 2024-09-01 VITALS — BP 133/86 | HR 97 | Temp 98.3°F | Ht 74.0 in | Wt 150.0 lb

## 2024-09-01 DIAGNOSIS — I739 Peripheral vascular disease, unspecified: Secondary | ICD-10-CM

## 2024-09-01 DIAGNOSIS — Z89611 Acquired absence of right leg above knee: Secondary | ICD-10-CM

## 2024-09-01 MED ORDER — CARVEDILOL 3.125 MG PO TABS: 3.1250 mg | ORAL_TABLET | Freq: Two times a day (BID) | ORAL | 0 refills | Status: DC

## 2024-09-01 MED ORDER — EMPAGLIFLOZIN 10 MG PO TABS: 10.0000 mg | ORAL_TABLET | Freq: Every day | ORAL | 1 refills | Status: AC

## 2024-09-01 MED ORDER — POTASSIUM CHLORIDE ER 10 MEQ PO TBCR: 10.0000 meq | EXTENDED_RELEASE_TABLET | Freq: Two times a day (BID) | ORAL | 3 refills | Status: AC

## 2024-09-01 MED ORDER — METFORMIN HCL 1000 MG PO TABS: 1000.0000 mg | ORAL_TABLET | Freq: Two times a day (BID) | ORAL | 0 refills | Status: DC

## 2024-09-01 MED ORDER — ROSUVASTATIN CALCIUM 20 MG PO TABS: 20.0000 mg | ORAL_TABLET | Freq: Every day | ORAL | 1 refills | Status: AC

## 2024-09-01 NOTE — Progress Notes (Signed)
 POST OPERATIVE OFFICE NOTE    CC:  F/u for surgery  HPI:  This is a 68 y.o. 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 Serene.  He unfortunately presented to hospital with several week history of leg pain and had lost of motor and sensation. He underwent angiogram that showed no revascularization options so amputation was recommended. He did well post operatively and was discharged to SNF on 07/31/24.   Pt returns today for follow up with his sister.  Pt states overall he is doing very well. He is back at home now. Still doing outpatient PT. Able to stand with assistance and transfer. Is not having any pain in right AKA. Has had some drainage from central aspect of incision line otherwise denies any concerns. No issues with LLE. No pain or wounds. Medically managed on Eliquis , Aspirin  and Statin.  Allergies  Allergen Reactions   Lipitor [Atorvastatin] Nausea And Vomiting    Current Outpatient Medications  Medication Sig Dispense Refill   acetaminophen  (TYLENOL ) 325 MG tablet Take 1-2 tablets (325-650 mg total) by mouth every 4 (four) hours as needed for mild pain. (Patient taking differently: Take 325 mg by mouth daily as needed for mild pain (pain score 1-3) or moderate pain (pain score 4-6).)     apixaban  (ELIQUIS ) 5 MG TABS tablet Take 1 tablet (5 mg total) by mouth 2 (two) times daily. Needs Cardiology appt for refills.  Call office [phone].  Thank you..Take 1 tablet (5 mg total) by mouth 2 (two) times daily. (Patient taking differently: Take 5 mg by mouth 2 (two) times daily.) 60 tablet 0   aspirin  EC 81 MG tablet Take 1 tablet (81 mg total) by mouth daily. Swallow whole. 30 tablet 12   carvedilol  (COREG ) 3.125 MG tablet Take 1 tablet (3.125 mg total) by mouth 2 (two) times daily with a meal. 180 tablet 0   empagliflozin  (JARDIANCE ) 10 MG TABS tablet Take 1 tablet (10 mg total) by mouth daily before breakfast. 90 tablet 1   Lancets 30G MISC Use to check blood  sugars daily 100 each 3   metFORMIN  (GLUCOPHAGE ) 1000 MG tablet Take 1 tablet (1,000 mg total) by mouth 2 (two) times daily with a meal. 180 tablet 0   metoprolol  tartrate (LOPRESSOR ) 25 MG tablet Take 0.5 tablets (12.5 mg total) by mouth 2 (two) times daily.     potassium chloride  (KLOR-CON ) 10 MEQ tablet Take 1 tablet (10 mEq total) by mouth 2 (two) times daily. 30 tablet 3   rosuvastatin  (CRESTOR ) 20 MG tablet Take 1 tablet (20 mg total) by mouth daily. 90 tablet 1   No current facility-administered medications for this visit.     ROS:  See HPI  Physical Exam:  Vitals:   09/01/24 1447  BP: 133/86  Pulse: 97  Temp: 98.3 F (36.8 C)    Incision:  right AKA healing well. Small central area that I think was granuloma from staple. Staples all removed today. No expressible drainage. Otherwise healing well  Extremities:  LLE well perfused and warm Neuro: alert and oriented  Assessment/Plan:  This is a 68 y.o. 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 Serene.  He unfortunately presented to hospital with several week history of leg pain and had lost of motor and sensation so amputation was recommended. He is doing well following Amputation. He is without much post operative pain. His staples were removed today and wound healing well. There  is central aspect that has some fat necrosis. I suspect he had a little granuloma from a staple. Anticipate now that staples are removed that he will heal up okay. I will have him follow up in 2 weeks for wound recheck.   Curry Damme, [facility] [phone]   Clinic MD:  Serene

## 2024-09-01 NOTE — Telephone Encounter (Signed)
 Rn LVM for pt to return call. Eliquis  is filled by Cardiology.

## 2024-09-01 NOTE — Telephone Encounter (Signed)
 Copied from CRM #[id number]. Topic: Clinical - Medication Refill >> Sep 01, 2024 12:38 PM Rea ORN wrote: Medication:  carvedilol  (COREG ) 3.125 MG tablet apixaban  (ELIQUIS ) 5 MG TABS tablet empagliflozin  (JARDIANCE ) 10 MG TABS tablet metFORMIN  (GLUCOPHAGE ) 1000 MG tablet potassium chloride  (KLOR-CON ) 10 MEQ tablet rosuvastatin  (CRESTOR ) 20 MG tablet  Has the patient contacted their pharmacy? No (Agent: If no, request that the patient contact the pharmacy for the refill. If patient does not wish to contact the pharmacy document the reason why and proceed with request.) (Agent: If yes, when and what did the pharmacy advise?)  This is the patient's preferred pharmacy:  Sonny with [facility] is attempting to request all pt's med to this new pharmacy. He has not filled with them yet. [phone])  Is this the correct pharmacy for this prescription? Yes If no, delete pharmacy and type the correct one.   Has the prescription been filled recently? No  Is the patient out of the medication? No  Has the patient been seen for an appointment in the last year OR does the patient have an upcoming appointment? Yes  Can we respond through MyChart? No  Agent: Please be advised that Rx refills may take up to 3 business days. We ask that you follow-up with your pharmacy.

## 2024-09-03 ENCOUNTER — Telehealth: Payer: Self-pay

## 2024-09-03 NOTE — Telephone Encounter (Unsigned)
 Copied from CRM #[id number]. Topic: Clinical - Home Health Verbal Orders >> Sep 03, 2024  2:15 PM Pinkey ORN wrote: Caller/Agency: [facility] Callback Number: [phone] Service Requested: Occupational Therapy Frequency: 1 week for 4 week  Any new concerns about the patient? No

## 2024-09-04 ENCOUNTER — Telehealth: Payer: Self-pay

## 2024-09-04 ENCOUNTER — Other Ambulatory Visit: Payer: Self-pay

## 2024-09-04 DIAGNOSIS — E1159 Type 2 diabetes mellitus with other circulatory complications: Secondary | ICD-10-CM

## 2024-09-04 MED ORDER — BLOOD GLUCOSE TEST VI STRP: ORAL_STRIP | 5 refills | Status: AC

## 2024-09-04 MED ORDER — BLOOD GLUCOSE MONITORING SUPPL DEVI: 0 refills | Status: AC

## 2024-09-04 MED ORDER — LANCET DEVICE MISC: 5 refills | Status: AC

## 2024-09-04 NOTE — Telephone Encounter (Signed)
 Copied from CRM [id number]. Topic: Clinical - Medication Question >> Sep 03, 2024  3:37 PM Shereese L wrote: Reason for CRM: Katheryn from [facility] is calling and stated that he doesn't have a meter, lancets or strips to check his sugar. Home care nurse is requesting that an order be put in for these items

## 2024-09-04 NOTE — Telephone Encounter (Signed)
 Monitor and supplies sent today to [facility] [place].

## 2024-09-04 NOTE — Telephone Encounter (Signed)
 Verbals left today for [facility] on number provided.

## 2024-09-11 DIAGNOSIS — N183 Chronic kidney disease, stage 3 unspecified: Secondary | ICD-10-CM | POA: Diagnosis not present

## 2024-09-11 DIAGNOSIS — Z89611 Acquired absence of right leg above knee: Secondary | ICD-10-CM | POA: Diagnosis not present

## 2024-09-11 DIAGNOSIS — G8110 Spastic hemiplegia affecting unspecified side: Secondary | ICD-10-CM | POA: Diagnosis not present

## 2024-09-11 DIAGNOSIS — I69393 Ataxia following cerebral infarction: Secondary | ICD-10-CM | POA: Diagnosis not present

## 2024-09-11 DIAGNOSIS — E1122 Type 2 diabetes mellitus with diabetic chronic kidney disease: Secondary | ICD-10-CM | POA: Diagnosis not present

## 2024-09-11 DIAGNOSIS — Z4781 Encounter for orthopedic aftercare following surgical amputation: Secondary | ICD-10-CM | POA: Diagnosis not present

## 2024-09-11 DIAGNOSIS — I2510 Atherosclerotic heart disease of native coronary artery without angina pectoris: Secondary | ICD-10-CM | POA: Diagnosis not present

## 2024-09-11 DIAGNOSIS — I130 Hypertensive heart and chronic kidney disease with heart failure and stage 1 through stage 4 chronic kidney disease, or unspecified chronic kidney disease: Secondary | ICD-10-CM | POA: Diagnosis not present

## 2024-09-11 DIAGNOSIS — I5022 Chronic systolic (congestive) heart failure: Secondary | ICD-10-CM | POA: Diagnosis not present

## 2024-09-12 DIAGNOSIS — I69393 Ataxia following cerebral infarction: Secondary | ICD-10-CM | POA: Diagnosis not present

## 2024-09-12 DIAGNOSIS — I5022 Chronic systolic (congestive) heart failure: Secondary | ICD-10-CM | POA: Diagnosis not present

## 2024-09-12 DIAGNOSIS — G8110 Spastic hemiplegia affecting unspecified side: Secondary | ICD-10-CM | POA: Diagnosis not present

## 2024-09-12 DIAGNOSIS — Z89611 Acquired absence of right leg above knee: Secondary | ICD-10-CM | POA: Diagnosis not present

## 2024-09-12 DIAGNOSIS — Z4781 Encounter for orthopedic aftercare following surgical amputation: Secondary | ICD-10-CM | POA: Diagnosis not present

## 2024-09-12 DIAGNOSIS — E1122 Type 2 diabetes mellitus with diabetic chronic kidney disease: Secondary | ICD-10-CM | POA: Diagnosis not present

## 2024-09-12 DIAGNOSIS — I130 Hypertensive heart and chronic kidney disease with heart failure and stage 1 through stage 4 chronic kidney disease, or unspecified chronic kidney disease: Secondary | ICD-10-CM | POA: Diagnosis not present

## 2024-09-12 DIAGNOSIS — I2510 Atherosclerotic heart disease of native coronary artery without angina pectoris: Secondary | ICD-10-CM | POA: Diagnosis not present

## 2024-09-12 DIAGNOSIS — N183 Chronic kidney disease, stage 3 unspecified: Secondary | ICD-10-CM | POA: Diagnosis not present

## 2024-09-15 ENCOUNTER — Ambulatory Visit: Attending: Surgery | Admitting: Physician Assistant

## 2024-09-15 VITALS — BP 106/75 | HR 94 | Temp 98.1°F

## 2024-09-15 DIAGNOSIS — I739 Peripheral vascular disease, unspecified: Secondary | ICD-10-CM

## 2024-09-15 DIAGNOSIS — Z89611 Acquired absence of right leg above knee: Secondary | ICD-10-CM

## 2024-09-15 NOTE — Progress Notes (Signed)
 POST OPERATIVE OFFICE NOTE    CC:  F/u for surgery  HPI:  This is a 68 y.o. male who is s/p right AKA on 07/23/2024 by Dr. Serene.  ABI on the left [place] with triphasic waveforms and TBI 0.36 with toe pressure of 41.   Pt was seen on 09/01/2024 and he was home from SNF and no pain in his stump.  He was doing outpatient PT and was able to stand to transfer.  He did have some drainage from the central aspect of incision. He had a small granuloma and felt since staples removed, it would heal. He is here today for incision check.   Pt returns today for follow up.  Pt states there are still two staples in his incision.  He is not having any drainage.  He does not have any pain in his incision.  He denies any pain or non healing wounds on the left foot.    Allergies  Allergen Reactions   Lipitor [Atorvastatin] Nausea And Vomiting    Current Outpatient Medications  Medication Sig Dispense Refill   acetaminophen  (TYLENOL ) 325 MG tablet Take 1-2 tablets (325-650 mg total) by mouth every 4 (four) hours as needed for mild pain. (Patient taking differently: Take 325 mg by mouth daily as needed for mild pain (pain score 1-3) or moderate pain (pain score 4-6).)     apixaban  (ELIQUIS ) 5 MG TABS tablet Take 1 tablet (5 mg total) by mouth 2 (two) times daily. Needs Cardiology appt for refills.  Call office [phone].  Thank you..Take 1 tablet (5 mg total) by mouth 2 (two) times daily. (Patient taking differently: Take 5 mg by mouth 2 (two) times daily.) 60 tablet 0   aspirin  EC 81 MG tablet Take 1 tablet (81 mg total) by mouth daily. Swallow whole. 30 tablet 12   Blood Glucose Monitoring Suppl DEVI May substitute to any manufacturer covered by patient's insurance. 1 each 0   carvedilol  (COREG ) 3.125 MG tablet Take 1 tablet (3.125 mg total) by mouth 2 (two) times daily with a meal. 180 tablet 0   empagliflozin  (JARDIANCE ) 10 MG TABS tablet Take 1 tablet (10 mg total) by mouth daily before breakfast. 90  tablet 1   Glucose Blood (BLOOD GLUCOSE TEST STRIPS) STRP Use to test glucose up to TID prn. May substitute to any manufacturer covered by patient's insurance. 100 strip 5   Lancet Device MISC Use to test glucose TID prn. May substitute to any manufacturer covered by patient's insurance. 100 each 5   Lancets 30G MISC Use to check blood sugars daily 100 each 3   metFORMIN  (GLUCOPHAGE ) 1000 MG tablet Take 1 tablet (1,000 mg total) by mouth 2 (two) times daily with a meal. 180 tablet 0   metoprolol  tartrate (LOPRESSOR ) 25 MG tablet Take 0.5 tablets (12.5 mg total) by mouth 2 (two) times daily.     potassium chloride  (KLOR-CON ) 10 MEQ tablet Take 1 tablet (10 mEq total) by mouth 2 (two) times daily. 30 tablet 3   rosuvastatin  (CRESTOR ) 20 MG tablet Take 1 tablet (20 mg total) by mouth daily. 90 tablet 1   No current facility-administered medications for this visit.     ROS:  See HPI  Physical Exam:  Today's Vitals   09/15/24 1009  BP: 106/75  Pulse: 94  Temp: 98.1 F (36.7 C)  TempSrc: Temporal  PainSc: 0-No pain   There is no height or weight on file to calculate BMI.   Incision:  Assessment/Plan:  This is a 68 y.o. male who is s/p: right AKA on 07/23/2024 by Dr. Serene.   -right AKA wound looks better.  He did have two staples in his incision, one in the mid portion and one lateral.  He has some fibrinous tissue in the central aspect.  It looks improved from his last visit.  Instructed him to cleanse with soap and water  daily and apply dry dressing.  -will have him return in 4 weeks for wound check and we will get ABI of the left foot at that time.  Instructed him to continue to protect the left foot.     Lucie Apt, [facility] [phone]   Clinic MD:  Serene

## 2024-09-16 ENCOUNTER — Other Ambulatory Visit: Payer: Self-pay | Admitting: Surgery

## 2024-09-16 DIAGNOSIS — I2510 Atherosclerotic heart disease of native coronary artery without angina pectoris: Secondary | ICD-10-CM | POA: Diagnosis not present

## 2024-09-16 DIAGNOSIS — Z4781 Encounter for orthopedic aftercare following surgical amputation: Secondary | ICD-10-CM | POA: Diagnosis not present

## 2024-09-16 DIAGNOSIS — N183 Chronic kidney disease, stage 3 unspecified: Secondary | ICD-10-CM | POA: Diagnosis not present

## 2024-09-16 DIAGNOSIS — Z89611 Acquired absence of right leg above knee: Secondary | ICD-10-CM | POA: Diagnosis not present

## 2024-09-16 DIAGNOSIS — I5022 Chronic systolic (congestive) heart failure: Secondary | ICD-10-CM | POA: Diagnosis not present

## 2024-09-16 DIAGNOSIS — E1122 Type 2 diabetes mellitus with diabetic chronic kidney disease: Secondary | ICD-10-CM | POA: Diagnosis not present

## 2024-09-16 DIAGNOSIS — I69393 Ataxia following cerebral infarction: Secondary | ICD-10-CM | POA: Diagnosis not present

## 2024-09-16 DIAGNOSIS — I739 Peripheral vascular disease, unspecified: Secondary | ICD-10-CM

## 2024-09-16 DIAGNOSIS — I130 Hypertensive heart and chronic kidney disease with heart failure and stage 1 through stage 4 chronic kidney disease, or unspecified chronic kidney disease: Secondary | ICD-10-CM | POA: Diagnosis not present

## 2024-09-17 DIAGNOSIS — Z4781 Encounter for orthopedic aftercare following surgical amputation: Secondary | ICD-10-CM | POA: Diagnosis not present

## 2024-09-17 DIAGNOSIS — I69393 Ataxia following cerebral infarction: Secondary | ICD-10-CM | POA: Diagnosis not present

## 2024-09-17 DIAGNOSIS — Z89611 Acquired absence of right leg above knee: Secondary | ICD-10-CM | POA: Diagnosis not present

## 2024-09-17 DIAGNOSIS — E1122 Type 2 diabetes mellitus with diabetic chronic kidney disease: Secondary | ICD-10-CM | POA: Diagnosis not present

## 2024-09-17 DIAGNOSIS — I2510 Atherosclerotic heart disease of native coronary artery without angina pectoris: Secondary | ICD-10-CM | POA: Diagnosis not present

## 2024-09-17 DIAGNOSIS — G8110 Spastic hemiplegia affecting unspecified side: Secondary | ICD-10-CM | POA: Diagnosis not present

## 2024-09-17 DIAGNOSIS — I5022 Chronic systolic (congestive) heart failure: Secondary | ICD-10-CM | POA: Diagnosis not present

## 2024-09-17 DIAGNOSIS — N183 Chronic kidney disease, stage 3 unspecified: Secondary | ICD-10-CM | POA: Diagnosis not present

## 2024-09-18 ENCOUNTER — Other Ambulatory Visit: Payer: Self-pay | Admitting: *Deleted

## 2024-09-18 DIAGNOSIS — I739 Peripheral vascular disease, unspecified: Secondary | ICD-10-CM

## 2024-09-21 DIAGNOSIS — I2510 Atherosclerotic heart disease of native coronary artery without angina pectoris: Secondary | ICD-10-CM | POA: Diagnosis not present

## 2024-09-21 DIAGNOSIS — Z89611 Acquired absence of right leg above knee: Secondary | ICD-10-CM | POA: Diagnosis not present

## 2024-09-21 DIAGNOSIS — N183 Chronic kidney disease, stage 3 unspecified: Secondary | ICD-10-CM | POA: Diagnosis not present

## 2024-09-21 DIAGNOSIS — G8110 Spastic hemiplegia affecting unspecified side: Secondary | ICD-10-CM | POA: Diagnosis not present

## 2024-09-21 DIAGNOSIS — I130 Hypertensive heart and chronic kidney disease with heart failure and stage 1 through stage 4 chronic kidney disease, or unspecified chronic kidney disease: Secondary | ICD-10-CM | POA: Diagnosis not present

## 2024-09-21 DIAGNOSIS — I69393 Ataxia following cerebral infarction: Secondary | ICD-10-CM | POA: Diagnosis not present

## 2024-09-21 DIAGNOSIS — I5022 Chronic systolic (congestive) heart failure: Secondary | ICD-10-CM | POA: Diagnosis not present

## 2024-09-21 DIAGNOSIS — E1122 Type 2 diabetes mellitus with diabetic chronic kidney disease: Secondary | ICD-10-CM | POA: Diagnosis not present

## 2024-09-23 DIAGNOSIS — Z89611 Acquired absence of right leg above knee: Secondary | ICD-10-CM | POA: Diagnosis not present

## 2024-09-23 DIAGNOSIS — I130 Hypertensive heart and chronic kidney disease with heart failure and stage 1 through stage 4 chronic kidney disease, or unspecified chronic kidney disease: Secondary | ICD-10-CM | POA: Diagnosis not present

## 2024-09-23 DIAGNOSIS — G8110 Spastic hemiplegia affecting unspecified side: Secondary | ICD-10-CM | POA: Diagnosis not present

## 2024-09-23 DIAGNOSIS — Z4781 Encounter for orthopedic aftercare following surgical amputation: Secondary | ICD-10-CM | POA: Diagnosis not present

## 2024-09-23 DIAGNOSIS — I69393 Ataxia following cerebral infarction: Secondary | ICD-10-CM | POA: Diagnosis not present

## 2024-09-23 DIAGNOSIS — I2510 Atherosclerotic heart disease of native coronary artery without angina pectoris: Secondary | ICD-10-CM | POA: Diagnosis not present

## 2024-09-23 DIAGNOSIS — E1122 Type 2 diabetes mellitus with diabetic chronic kidney disease: Secondary | ICD-10-CM | POA: Diagnosis not present

## 2024-09-23 DIAGNOSIS — I5022 Chronic systolic (congestive) heart failure: Secondary | ICD-10-CM | POA: Diagnosis not present

## 2024-09-23 DIAGNOSIS — N183 Chronic kidney disease, stage 3 unspecified: Secondary | ICD-10-CM | POA: Diagnosis not present

## 2024-09-24 DIAGNOSIS — N183 Chronic kidney disease, stage 3 unspecified: Secondary | ICD-10-CM | POA: Diagnosis not present

## 2024-09-24 DIAGNOSIS — Z89611 Acquired absence of right leg above knee: Secondary | ICD-10-CM | POA: Diagnosis not present

## 2024-09-24 DIAGNOSIS — I130 Hypertensive heart and chronic kidney disease with heart failure and stage 1 through stage 4 chronic kidney disease, or unspecified chronic kidney disease: Secondary | ICD-10-CM | POA: Diagnosis not present

## 2024-09-24 DIAGNOSIS — I5022 Chronic systolic (congestive) heart failure: Secondary | ICD-10-CM | POA: Diagnosis not present

## 2024-09-24 DIAGNOSIS — I69393 Ataxia following cerebral infarction: Secondary | ICD-10-CM | POA: Diagnosis not present

## 2024-09-24 DIAGNOSIS — I2510 Atherosclerotic heart disease of native coronary artery without angina pectoris: Secondary | ICD-10-CM | POA: Diagnosis not present

## 2024-09-24 DIAGNOSIS — Z4781 Encounter for orthopedic aftercare following surgical amputation: Secondary | ICD-10-CM | POA: Diagnosis not present

## 2024-09-24 DIAGNOSIS — G8110 Spastic hemiplegia affecting unspecified side: Secondary | ICD-10-CM | POA: Diagnosis not present

## 2024-09-24 DIAGNOSIS — E1122 Type 2 diabetes mellitus with diabetic chronic kidney disease: Secondary | ICD-10-CM | POA: Diagnosis not present

## 2024-09-29 ENCOUNTER — Encounter: Payer: Self-pay | Admitting: Internal Medicine

## 2024-09-29 DIAGNOSIS — Z89611 Acquired absence of right leg above knee: Secondary | ICD-10-CM | POA: Insufficient documentation

## 2024-09-29 DIAGNOSIS — E559 Vitamin D deficiency, unspecified: Secondary | ICD-10-CM | POA: Insufficient documentation

## 2024-09-29 NOTE — Assessment & Plan Note (Addendum)
 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

## 2024-09-29 NOTE — Progress Notes (Signed)
      Subjective:    Patient ID: Karl [name], male    DOB: [birth year], 68 y.o.   MRN: [id number]     HPI Karl [name] is here for follow up of his chronic medical problems.  S/p right AKA 07/2024  Medications and allergies reviewed with patient and updated if appropriate.  Current Outpatient Medications on File Prior to Visit  Medication Sig Dispense Refill  . acetaminophen  (TYLENOL ) 325 MG tablet Take 1-2 tablets (325-650 mg total) by mouth every 4 (four) hours as needed for mild pain. (Patient taking differently: Take 325 mg by mouth daily as needed for mild pain (pain score 1-3) or moderate pain (pain score 4-6).)    . aspirin  EC 81 MG tablet Take 1 tablet (81 mg total) by mouth daily. Swallow whole. 30 tablet 12  . Blood Glucose Monitoring Suppl DEVI May substitute to any manufacturer covered by patient's insurance. 1 each 0  . empagliflozin  (JARDIANCE ) 10 MG TABS tablet Take 1 tablet (10 mg total) by mouth daily before breakfast. 90 tablet 1  . Glucose Blood (BLOOD GLUCOSE TEST STRIPS) STRP Use to test glucose up to TID prn. May substitute to any manufacturer covered by patient's insurance. 100 strip 5  . Lancet Device MISC Use to test glucose TID prn. May substitute to any manufacturer covered by patient's insurance. 100 each 5  . Lancets 30G MISC Use to check blood sugars daily 100 each 3  . metFORMIN  (GLUCOPHAGE ) 1000 MG tablet Take 1 tablet (1,000 mg total) by mouth 2 (two) times daily with a meal. 180 tablet 0  . potassium chloride  (KLOR-CON ) 10 MEQ tablet Take 1 tablet (10 mEq total) by mouth 2 (two) times daily. 30 tablet 3  . rosuvastatin  (CRESTOR ) 20 MG tablet Take 1 tablet (20 mg total) by mouth daily. 90 tablet 1  . [DISCONTINUED] rivaroxaban  (XARELTO ) 20 MG TABS tablet Take 1 tablet (20 mg total) by mouth daily. 30 tablet 0   No current facility-administered medications on file prior to visit.     Review of Systems     Objective:  There were no vitals filed for this  visit. BP Readings from Last 3 Encounters:  10/16/24 124/80  10/06/24 108/71  09/15/24 106/75   Wt Readings from Last 3 Encounters:  10/06/24 135 lb (61.2 kg)  09/01/24 150 lb (68 kg)  07/23/24 150 lb (68 kg)   There is no height or weight on file to calculate BMI.    Physical Exam     Lab Results  Component Value Date   WBC 5.3 10/16/2024   HGB 11.8 (L) 10/16/2024   HCT 36.6 (L) 10/16/2024   PLT 327.0 10/16/2024   GLUCOSE 147 (H) 10/16/2024   CHOL 116 10/16/2024   TRIG 94.0 10/16/2024   HDL 44.10 10/16/2024   LDLDIRECT 135.3 06/22/2008   LDLCALC 53 10/16/2024   ALT 17 10/16/2024   AST 21 10/16/2024   NA 135 10/16/2024   K 4.1 10/16/2024   CL 99 10/16/2024   CREATININE 1.38 10/16/2024   BUN 13 10/16/2024   CO2 27 10/16/2024   TSH 1.71 10/16/2024   PSA 8.99 (H) 10/02/2022   INR 1.3 (H) 07/21/2024   HGBA1C 6.8 (H) 10/16/2024   MICROALBUR 4.7 (H) 03/31/2024     Assessment & Plan:    See Problem List for Assessment and Plan of chronic medical problems.    This encounter was created in error - please disregard.

## 2024-09-29 NOTE — Patient Instructions (Addendum)
      Blood work was ordered.       Medications changes include :   None    A referral was ordered and someone will call you to schedule an appointment.     Return in about 6 months (around 03/31/2025) for follow up.

## 2024-09-29 NOTE — Assessment & Plan Note (Signed)
 Improved.

## 2024-09-30 ENCOUNTER — Encounter: Admitting: Internal Medicine

## 2024-09-30 DIAGNOSIS — I2510 Atherosclerotic heart disease of native coronary artery without angina pectoris: Secondary | ICD-10-CM

## 2024-09-30 DIAGNOSIS — I5022 Chronic systolic (congestive) heart failure: Secondary | ICD-10-CM

## 2024-09-30 DIAGNOSIS — I10 Essential (primary) hypertension: Secondary | ICD-10-CM

## 2024-09-30 DIAGNOSIS — E559 Vitamin D deficiency, unspecified: Secondary | ICD-10-CM

## 2024-09-30 DIAGNOSIS — I69998 Other sequelae following unspecified cerebrovascular disease: Secondary | ICD-10-CM

## 2024-09-30 DIAGNOSIS — N1831 Chronic kidney disease, stage 3a: Secondary | ICD-10-CM

## 2024-09-30 DIAGNOSIS — E1151 Type 2 diabetes mellitus with diabetic peripheral angiopathy without gangrene: Secondary | ICD-10-CM

## 2024-09-30 DIAGNOSIS — Z89611 Acquired absence of right leg above knee: Secondary | ICD-10-CM

## 2024-09-30 DIAGNOSIS — E1169 Type 2 diabetes mellitus with other specified complication: Secondary | ICD-10-CM

## 2024-09-30 NOTE — Assessment & Plan Note (Signed)
 Chronic No symptoms c/w angina Continue coreg  3.125 mg bid, metoprolol  12.5 mg twice daily, Eliquis  5 mg twice daily, Crestor  20 mg daily

## 2024-09-30 NOTE — Assessment & Plan Note (Signed)
 Chronic Taking vitamin D daily Check vitamin D level

## 2024-09-30 NOTE — Assessment & Plan Note (Signed)
 Chronic Lab Results  Component Value Date   LDLCALC 97 07/22/2024   Regular exercise and healthy diet encouraged Check lipid panel, CMP Continue Crestor  20 mg daily

## 2024-09-30 NOTE — Assessment & Plan Note (Signed)
 Chronic On Eliquis  5 mg twice daily, Jardiance  10 mg daily, Crestor  20 mg daily Blood pressure controlled Sugars controlled Continue regular exercise-continue regular walking with walker

## 2024-09-30 NOTE — Assessment & Plan Note (Signed)
 Chronic Follow with cardiology Appears euvolemic Continue metoprolol  12.5 mg twice daily, carvedilol  3.125 mg twice daily, Jardiance  10 mg daily

## 2024-09-30 NOTE — Assessment & Plan Note (Signed)
 Chronic BP well controlled Continue coreg  3.125 mg bid, metoprolol  12.5 mg twice daily. Cmp, cbc

## 2024-09-30 NOTE — Assessment & Plan Note (Signed)
 Chronic Associated with eating, CAD, hypertension, hyperlipidemia  Lab Results  Component Value Date   HGBA1C 5.9 (H) 07/18/2024   Sugars controlled Check A1c Continue metformin  1000 mg twice daily, Jardiance  10 mg daily Stressed regular exercise, diabetic diet

## 2024-10-06 ENCOUNTER — Ambulatory Visit (HOSPITAL_COMMUNITY)
Admission: RE | Admit: 2024-10-06 | Discharge: 2024-10-06 | Disposition: A | Discharge status: Home / Self Care | Source: Ambulatory Visit | Attending: Surgery | Admitting: Surgery

## 2024-10-06 ENCOUNTER — Ambulatory Visit: Attending: Surgery | Admitting: Physician Assistant

## 2024-10-06 ENCOUNTER — Ambulatory Visit

## 2024-10-06 VITALS — BP 108/71 | HR 86 | Temp 98.3°F

## 2024-10-06 VITALS — Ht 74.0 in | Wt 135.0 lb

## 2024-10-06 DIAGNOSIS — I739 Peripheral vascular disease, unspecified: Secondary | ICD-10-CM | POA: Diagnosis not present

## 2024-10-06 DIAGNOSIS — Z89611 Acquired absence of right leg above knee: Secondary | ICD-10-CM

## 2024-10-06 DIAGNOSIS — Z0000 Encounter for general adult medical examination without abnormal findings: Secondary | ICD-10-CM | POA: Diagnosis not present

## 2024-10-06 LAB — VAS US ABI WITH/WO TBI

## 2024-10-06 NOTE — Progress Notes (Signed)
 POST OPERATIVE OFFICE NOTE    CC:  F/u for surgery  HPI:  This is a 68 y.o. male who is s/p right AKA by Dr. Serene on 07/23/2024.  The right AKA incision has healed.  He denies any rest pain or tissue loss of the left lower extremity.  He believes he is ready to work on wearing a prosthetic limb.  He is on Eliquis  daily.  He discontinued his daily aspirin .  He however is on a statin daily.  He denies tobacco use.  Allergies  Allergen Reactions   Lipitor [Atorvastatin] Nausea And Vomiting    Current Outpatient Medications  Medication Sig Dispense Refill   acetaminophen  (TYLENOL ) 325 MG tablet Take 1-2 tablets (325-650 mg total) by mouth every 4 (four) hours as needed for mild pain. (Patient taking differently: Take 325 mg by mouth daily as needed for mild pain (pain score 1-3) or moderate pain (pain score 4-6).)     apixaban  (ELIQUIS ) 5 MG TABS tablet Take 1 tablet (5 mg total) by mouth 2 (two) times daily. Needs Cardiology appt for refills.  Call office [phone].  Thank you..Take 1 tablet (5 mg total) by mouth 2 (two) times daily. (Patient taking differently: Take 5 mg by mouth 2 (two) times daily.) 60 tablet 0   aspirin  EC 81 MG tablet Take 1 tablet (81 mg total) by mouth daily. Swallow whole. 30 tablet 12   Blood Glucose Monitoring Suppl DEVI May substitute to any manufacturer covered by patient's insurance. 1 each 0   carvedilol  (COREG ) 3.125 MG tablet Take 1 tablet (3.125 mg total) by mouth 2 (two) times daily with a meal. 180 tablet 0   empagliflozin  (JARDIANCE ) 10 MG TABS tablet Take 1 tablet (10 mg total) by mouth daily before breakfast. 90 tablet 1   Glucose Blood (BLOOD GLUCOSE TEST STRIPS) STRP Use to test glucose up to TID prn. May substitute to any manufacturer covered by patient's insurance. 100 strip 5   Lancet Device MISC Use to test glucose TID prn. May substitute to any manufacturer covered by patient's insurance. 100 each 5   Lancets 30G MISC Use to check blood sugars  daily 100 each 3   metFORMIN  (GLUCOPHAGE ) 1000 MG tablet Take 1 tablet (1,000 mg total) by mouth 2 (two) times daily with a meal. 180 tablet 0   metoprolol  tartrate (LOPRESSOR ) 25 MG tablet Take 0.5 tablets (12.5 mg total) by mouth 2 (two) times daily.     potassium chloride  (KLOR-CON ) 10 MEQ tablet Take 1 tablet (10 mEq total) by mouth 2 (two) times daily. 30 tablet 3   rosuvastatin  (CRESTOR ) 20 MG tablet Take 1 tablet (20 mg total) by mouth daily. 90 tablet 1   No current facility-administered medications for this visit.     ROS:  See HPI  Physical Exam:  Vitals:   10/06/24 1124  BP: 108/71  Pulse: 86  Temp: 98.3 F (36.8 C)  TempSrc: Temporal    Incision:  R AKA with central small eschar Extremities: Left foot warm and well-perfused absent pedal pulses Neuro: A&O  ABI/TBIToday's ABIToday's TBIPrevious ABIPrevious TBI  +-------+-----------+-----------+------------+------------+  Right AKA        AKA        0.00        0.00          +-------+-----------+-----------+------------+------------+  Left  [place]         0.62       [place]  0.36          +-------+-----------+-----------+------------+--------   Assessment/Plan:  This is a 68 y.o. male who is s/p: Right AKA  Right above-the-knee amputation has healed.  He does have a small central eschar however we will work on his referral to the [facility] to begin the process to obtain a prosthetic limb.  He left foot is warm and well-perfused.  He is currently without rest pain or tissue loss.  We will repeat ABI every year.  The patient has a right Above Knee Amputation. The patient is well motivated to return to their prior functional status by utilizing a prosthesis to perform ADL's and maintain a healthy lifestyle. The patient has the physical and cognitive capacity to function with a prosthesis.   Functional Level: K2 Limited Community Ambulator: Has the ability or potential for ambulation and to traverse  low environmental barriers such as curbs, stairs or uneven surfaces  Residual Limb History: The skin condition of the residual limb is good. The patient will continue to monitor the skin of the residual limb and follow hygiene instructions.  The patient is experiencing no pain related to amputation  Prosthetic Prescription Plan: Counseling and education regarding prosthetic management will be provided to the patient via a certified prosthetist. A multi-discipline team, including physical therapy, will manage the prosthetic fabrication, fitting and prosthetic gait training.     Donnice Sender, PA-C [facility] [phone]  Clinic MD:  Serene

## 2024-10-06 NOTE — Patient Instructions (Addendum)
 [name],  Thank you for taking the time for your Medicare Wellness Visit. I appreciate your continued commitment to your health goals. Please review the care plan we discussed, and feel free to reach out if I can assist you further.  Please note that Annual Wellness Visits do not include a physical exam. Some assessments may be limited, especially if the visit was conducted virtually. If needed, we may recommend an in-person follow-up with your provider.  Ongoing Care Seeing your primary care provider every 3 to 6 months helps us  monitor your health and provide consistent, personalized care.   Referrals If a referral was made during today's visit and you haven't received any updates within two weeks, please contact the referred provider directly to check on the status.  Recommended Screenings:  Health Maintenance  Topic Date Due   Pneumococcal Vaccine for age over 42 (1 of 2 - PCV) Never done   Colon Cancer Screening  Never done   Zoster (Shingles) Vaccine (1 of 2) 09/23/2004   Eye exam for diabetics  09/25/2015   Complete foot exam   02/07/2019   Flu Shot  07/04/2024   COVID-19 Vaccine (1 - 2025-26 season) 10/16/2024*   Hemoglobin A1C  01/18/2025   DTaP/Tdap/Td vaccine (2 - Td or Tdap) 02/28/2025   Yearly kidney health urinalysis for diabetes  03/31/2025   Yearly kidney function blood test for diabetes  07/30/2025   Medicare Annual Wellness Visit  10/06/2025   Hepatitis C Screening  Completed   Meningitis B Vaccine  Aged Out  *Topic was postponed. The date shown is not the original due date.       10/06/2024    3:01 PM  Advanced Directives  Does Patient Have a Medical Advance Directive? No  Would patient like information on creating a medical advance directive? No - Guardian declined    Vision: Annual vision screenings are recommended for early detection of glaucoma, cataracts, and diabetic retinopathy. These exams can also reveal signs of chronic conditions such as  diabetes and high blood pressure.  Dental: Annual dental screenings help detect early signs of oral cancer, gum disease, and other conditions linked to overall health, including heart disease and diabetes.

## 2024-10-06 NOTE — Progress Notes (Signed)
 Subjective:   Karl [name] is a 68 y.o. male who presents for a Medicare Annual Wellness Visit.  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/2024   Procedure: AMPUTATION, ABOVE KNEE;  Surgeon: Serene Gaile ORN, MD;  Location: [facility] OR;  Service: Vascular;  Laterality: Right;   CORONARY ANGIOPLASTY WITH STENT PLACEMENT  1998   FRACTURE SURGERY     GREEN LIGHT LASER TURP (TRANSURETHRAL RESECTION OF PROSTATE N/A 06/05/2018   Procedure: GREEN LIGHT LASER TURP , TRANSURETHRAL RESECTION OF PROSTATE;  Surgeon: Carolee Sherwood JONETTA DOUGLAS, MD;  Location: [facility] ORS;  Service: Urology;  Laterality: N/A;   LOWER EXTREMITY ANGIOGRAPHY N/A 07/21/2024   Procedure: Lower Extremity Angiography;  Surgeon: Pearline Norman RAMAN, MD;  Location[facility] INVASIVE CV LAB;  Service: Cardiovascular;  Laterality: N/A;   TIBIA FRACTURE SURGERY Left 1980s   Family History  Problem Relation Age of Onset   Diabetes Mother    Diabetes Father    Stroke Father 68   Diabetes Sister    Clotting disorder Sister 37   Diabetes Other        nephew   Diabetes Brother    Diabetes Maternal Grandmother    Colon cancer Neg Hx    Esophageal cancer Neg Hx    Pancreatic cancer Neg Hx    Stomach cancer Neg Hx    Liver disease Neg Hx    Social History   Occupational History   Not on file  Tobacco Use    Smoking status: Former    Current packs/day: 0.00    Average packs/day: 2.0 packs/day for 5.0 years (10.0 ttl pk-yrs)    Types: Cigarettes    Start date: 12/05/1995    Quit date: 12/04/2000    Years since quitting: 23.8   Smokeless tobacco: Never  Vaping Use   Vaping status: Never Used  Substance and Sexual Activity   Alcohol use: Not Currently   Drug use: Not Currently   Sexual activity: Not Currently   Tobacco Counseling Counseling given: Not Answered  SDOH Screenings   Food Insecurity: No Food Insecurity (10/06/2024)  Housing: Unknown (10/06/2024)  Transportation Needs: No Transportation Needs (10/06/2024)  Utilities: Not At Risk (10/06/2024)  Recent Concern: Utilities - At Risk (07/18/2024)  Depression (PHQ2-9): Low Risk  (10/06/2024)  Physical Activity: Inactive (10/06/2024)  Social Connections: Moderately Isolated (10/06/2024)  Stress: No Stress Concern Present (10/06/2024)  Tobacco Use: Medium Risk (10/06/2024)  Health Literacy: Adequate Health Literacy (10/06/2024)   Depression Screen    10/06/2024    3:08 PM 03/31/2024   11:51 AM 10/02/2022    1:10 PM 10/02/2022    1:09 PM 03/27/2022    1:39 PM 03/14/2021   11:08 AM 12/25/2019   11:08 AM  PHQ 2/9 Scores  PHQ - 2 Score 0 0 0 0 0 0  0  PHQ- 9 Score 3  8         Goals Addressed               This Visit's Progress     Patient Stated (pt-stated)        Patient stated he plans to continue to stay positive considering had leg amputation.         Visit info / Clinical Intake: Medicare Wellness Visit Type:: Initial Annual Wellness Visit Medicare Wellness Visit Mode:: Telephone If telephone:: video declined If telephone or video:: unable to obtan vitals due to lack of equipment Interpreter Needed?: No Pre-visit prep was completed: yes AWV questionnaire completed by patient prior to visit?: no Living arrangements:: lives with spouse/significant other Patient's Overall Health Status Rating: good Typical amount of pain:  none Does pain affect daily life?: no Are you currently prescribed opioids?: no  Dietary Habits and Nutritional Risks How many meals a day?: 3 Eats fruit and vegetables daily?: yes Most meals are obtained by: preparing own meals In the last 2 weeks, have you had any of the following?: -- (none) Diabetic:: (!) yes Any non-healing wounds?: no How often do you check your BS?: 1 (did not check today) Would you like to be referred to a Nutritionist or for Diabetic Management? : no  Functional Status Activities of Daily Living (to include ambulation/medication): Independent Ambulation: Independent with device- listed below Home Assistive Devices/Equipment: Eyeglasses; Wheelchair; Environmental Consultant (specify Type); Prosthesis (right leg - prosthesis) Medication Administration: Independent Home Management: Independent Manage your own finances?: yes Primary transportation is: family/friends Concerns about vision?: no *vision screening is required for WTM* Concerns about hearing?: no  Fall Screening Falls in the past year?: 0 Number of falls in past year: 0 Was there an injury with Fall?: 0 Fall Risk Category Calculator: 0 Patient Fall Risk Level: Low Fall Risk  Fall Risk Patient at Risk for Falls Due to: No Fall Risks Fall risk Follow up: Falls evaluation completed; Falls prevention discussed  Home and Transportation Safety: All rugs have non-skid backing?: N/A, no rugs All stairs or steps have railings?: N/A, no stairs Grab bars in the bathtub or shower?: yes Have non-skid surface in bathtub or shower?: yes Good home lighting?: yes Regular seat belt use?: yes Hospital stays in the last year:: (!) yes How many hospital stays:: 1 Reason: right leg amputation  Cognitive Assessment Difficulty concentrating, remembering, or making decisions? : no Will 6CIT or Mini Cog be Completed: yes What year is it?: 0 points What month is it?: 0 points Give patient an address phrase to remember (5  components): [address] [place], [place] About what time is it?: 0 points Count backwards from 20 to 1: 0 points Say the months of the year in reverse: 0 points Repeat the address phrase from earlier: 0 points 6 CIT Score: 0 points  Advance Directives (For Healthcare) Does Patient Have a Medical Advance Directive?: No Type of Advance Directive: Healthcare Power of Attorney Copy of Healthcare Power of Attorney in Chart?: No - copy requested Would patient like information on creating a medical advance directive?: No - Guardian declined  Reviewed/Updated  Reviewed/Updated: All        Objective:    Today's Vitals   10/06/24 1458  Weight: 135 lb (61.2 kg)  Height: 6' 2 (1.88 m)   Body mass index is 17.33 kg/m.  Current Medications (verified) Outpatient Encounter Medications as of 10/06/2024  Medication Sig   acetaminophen  (TYLENOL ) 325 MG tablet  Take 1-2 tablets (325-650 mg total) by mouth every 4 (four) hours as needed for mild pain. (Patient taking differently: Take 325 mg by mouth daily as needed for mild pain (pain score 1-3) or moderate pain (pain score 4-6).)   apixaban  (ELIQUIS ) 5 MG TABS tablet Take 1 tablet (5 mg total) by mouth 2 (two) times daily. Needs Cardiology appt for refills.  Call office [phone].  Thank you..Take 1 tablet (5 mg total) by mouth 2 (two) times daily. (Patient taking differently: Take 5 mg by mouth 2 (two) times daily.)   aspirin  EC 81 MG tablet Take 1 tablet (81 mg total) by mouth daily. Swallow whole.   Blood Glucose Monitoring Suppl DEVI May substitute to any manufacturer covered by patient's insurance.   carvedilol  (COREG ) 3.125 MG tablet Take 1 tablet (3.125 mg total) by mouth 2 (two) times daily with a meal.   empagliflozin  (JARDIANCE ) 10 MG TABS tablet Take 1 tablet (10 mg total) by mouth daily before breakfast.   Glucose Blood (BLOOD GLUCOSE TEST STRIPS) STRP Use to test glucose up to TID prn. May substitute to any manufacturer covered by  patient's insurance.   Lancet Device MISC Use to test glucose TID prn. May substitute to any manufacturer covered by patient's insurance.   Lancets 30G MISC Use to check blood sugars daily   metFORMIN  (GLUCOPHAGE ) 1000 MG tablet Take 1 tablet (1,000 mg total) by mouth 2 (two) times daily with a meal.   metoprolol  tartrate (LOPRESSOR ) 25 MG tablet Take 0.5 tablets (12.5 mg total) by mouth 2 (two) times daily.   potassium chloride  (KLOR-CON ) 10 MEQ tablet Take 1 tablet (10 mEq total) by mouth 2 (two) times daily.   rosuvastatin  (CRESTOR ) 20 MG tablet Take 1 tablet (20 mg total) by mouth daily.   [DISCONTINUED] rivaroxaban  (XARELTO ) 20 MG TABS tablet Take 1 tablet (20 mg total) by mouth daily.   No facility-administered encounter medications on file as of 10/06/2024.   Hearing/Vision screen Hearing Screening - Comments:: Denies hearing difficulties   Vision Screening - Comments:: Wears rx glasses - up to date with routine eye exams  Immunizations and Health Maintenance Health Maintenance  Topic Date Due   Pneumococcal Vaccine: 50+ Years (1 of 2 - PCV) Never done   Colonoscopy  Never done   Zoster Vaccines- Shingrix (1 of 2) 09/23/2004   OPHTHALMOLOGY EXAM  09/25/2015   FOOT EXAM  02/07/2019   Influenza Vaccine  07/04/2024   COVID-19 Vaccine (1 - 2025-26 season) 10/16/2024 (Originally 08/04/2024)   HEMOGLOBIN A1C  01/18/2025   DTaP/Tdap/Td (2 - Td or Tdap) 02/28/2025   Diabetic kidney evaluation - Urine ACR  03/31/2025   Diabetic kidney evaluation - eGFR measurement  07/30/2025   Medicare Annual Wellness (AWV)  10/06/2025   Hepatitis C Screening  Completed   Meningococcal B Vaccine  Aged Out        Assessment/Plan:  This is a routine wellness examination for [place].  Patient Care Team: Geofm Glade PARAS, MD as PCP - General (Internal Medicine) Waddell Danelle ORN, MD as PCP - Cardiology (Cardiology) Waddell Danelle ORN, MD as PCP - Electrophysiology (Cardiology) Waddell Danelle ORN, MD  (Cardiology) Trixie File, MD (Endocrinology) Alvia Norleen BIRCH, MD (Ophthalmology)  I have personally reviewed and noted the following in the patient's chart:   Medical and social history Use of alcohol, tobacco or illicit drugs  Current medications and supplements including opioid prescriptions. Functional ability and status Nutritional status Physical activity Advanced directives List of other  physicians Hospitalizations, surgeries, and ER visits in previous 12 months Vitals Screenings to include cognitive, depression, and falls Referrals and appointments  No orders of the defined types were placed in this encounter.  In addition, I have reviewed and discussed with patient certain preventive protocols, quality metrics, and best practice recommendations. A written personalized care plan for preventive services as well as general preventive health recommendations were provided to patient.   Verdie CHRISTELLA Saba, CMA   10/06/2024   Return in 1 year (on 10/06/2025).  After Visit Summary: (MyChart) Due to this being a telephonic visit, the after visit summary with patients personalized plan was offered to patient via MyChart   Nurse Notes: Scheduled 6-mth diabetes f/u w/PCP for 10/13/2024.

## 2024-10-07 NOTE — Progress Notes (Signed)
 Subjective:   Karl [name] is a 68 y.o. male who presents for a Medicare Annual Wellness Visit.  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 Waddell 15 years 1998   Dyspnea    Hypertension    Stroke [facility]) 2019   denies residual on 06/14/2018   TIA (transient ischemic attack) 12/19/2017   Type II diabetes mellitus (HCC) 07/2014 dx   Past Surgical History:  Procedure Laterality Date   ABDOMINAL AORTOGRAM N/A 07/21/2024   Procedure: ABDOMINAL AORTOGRAM;  Surgeon: Pearline Norman RAMAN, MD;  Location: [facility] INVASIVE CV LAB;  Service: Cardiovascular;  Laterality: N/A;   AMPUTATION Right 07/23/2024   Procedure: AMPUTATION, ABOVE KNEE;  Surgeon: Serene Gaile ORN, MD;  Location: [facility] OR;  Service: Vascular;  Laterality: Right;   CORONARY ANGIOPLASTY WITH STENT PLACEMENT  1998   FRACTURE SURGERY     GREEN LIGHT LASER TURP (TRANSURETHRAL RESECTION OF PROSTATE N/A 06/05/2018   Procedure: GREEN LIGHT LASER TURP , TRANSURETHRAL RESECTION OF PROSTATE;  Surgeon: Carolee Sherwood JONETTA DOUGLAS, MD;  Location: [facility] ORS;  Service: Urology;  Laterality: N/A;   LOWER EXTREMITY ANGIOGRAPHY N/A 07/21/2024   Procedure: Lower Extremity Angiography;  Surgeon: Pearline Norman RAMAN, MD;  Location: [facility] INVASIVE CV LAB;  Service: Cardiovascular;  Laterality: N/A;   TIBIA FRACTURE SURGERY Left 1980s   Family History  Problem Relation Age of Onset   Diabetes Mother    Diabetes Father    Stroke Father 68   Diabetes Sister    Clotting disorder Sister 25   Diabetes Other        nephew   Diabetes Brother    Diabetes Maternal Grandmother    Colon cancer Neg Hx    Esophageal cancer Neg Hx    Pancreatic cancer Neg Hx    Stomach cancer Neg Hx    Liver disease Neg Hx    Social History   Occupational History   Not on file  Tobacco Use    Smoking status: Former    Current packs/day: 0.00    Average packs/day: 2.0 packs/day for 5.0 years (10.0 ttl pk-yrs)    Types: Cigarettes    Start date: 12/05/1995    Quit date: 12/04/2000    Years since quitting: 23.8   Smokeless tobacco: Never  Vaping Use   Vaping status: Never Used  Substance and Sexual Activity   Alcohol use: Not Currently   Drug use: Not Currently   Sexual activity: Not Currently   Tobacco Counseling Counseling given: Not Answered  SDOH Screenings   Food Insecurity: No Food Insecurity (10/06/2024)  Housing: Unknown (10/06/2024)  Transportation Needs: No Transportation Needs (10/06/2024)  Utilities: Not At Risk (10/06/2024)  Recent Concern: Utilities - At Risk (07/18/2024)  Depression (PHQ2-9): Low Risk  (10/06/2024)  Physical Activity: Inactive (10/06/2024)  Social Connections: Moderately Isolated (10/06/2024)  Stress: No Stress Concern Present (10/06/2024)  Tobacco Use: Medium Risk (10/06/2024)  Health Literacy: Adequate Health Literacy (10/06/2024)   Depression Screen    10/06/2024    3:08 PM 03/31/2024   11:51 AM 10/02/2022    1:10 PM 10/02/2022    1:09 PM 03/27/2022    1:39 PM 03/14/2021   11:08 AM 12/25/2019   11:08 AM  PHQ 2/9 Scores  PHQ - 2 Score 0 0 0 0 0 0  0  PHQ- 9 Score 3  8         Goals Addressed               This Visit's Progress     Patient Stated (pt-stated)        Patient stated he plans to continue to stay positive considering had leg amputation.         Visit info / Clinical Intake: Medicare Wellness Visit Type:: Initial Annual Wellness Visit Medicare Wellness Visit Mode:: Telephone If telephone:: video declined If telephone or video:: unable to obtan vitals due to lack of equipment Interpreter Needed?: No Pre-visit prep was completed: yes AWV questionnaire completed by patient prior to visit?: no Living arrangements:: lives with spouse/significant other Patient's Overall Health Status Rating: good Typical amount of pain:  none Does pain affect daily life?: no Are you currently prescribed opioids?: no  Dietary Habits and Nutritional Risks How many meals a day?: 3 Eats fruit and vegetables daily?: yes Most meals are obtained by: preparing own meals In the last 2 weeks, have you had any of the following?: -- (none) Diabetic:: (!) yes Any non-healing wounds?: no How often do you check your BS?: 1 (did not check today) Would you like to be referred to a Nutritionist or for Diabetic Management? : no  Functional Status Activities of Daily Living (to include ambulation/medication): Independent Ambulation: Independent with device- listed below Home Assistive Devices/Equipment: Eyeglasses; Wheelchair; Environmental Consultant (specify Type); Prosthesis (right leg - prosthesis) Medication Administration: Independent Home Management: Independent Manage your own finances?: yes Primary transportation is: family/friends Concerns about vision?: no *vision screening is required for WTM* Concerns about hearing?: no  Fall Screening Falls in the past year?: 0 Number of falls in past year: 0 Was there an injury with Fall?: 0 Fall Risk Category Calculator: 0 Patient Fall Risk Level: Low Fall Risk  Fall Risk Patient at Risk for Falls Due to: No Fall Risks Fall risk Follow up: Falls evaluation completed; Falls prevention discussed  Home and Transportation Safety: All rugs have non-skid backing?: N/A, no rugs All stairs or steps have railings?: N/A, no stairs Grab bars in the bathtub or shower?: yes Have non-skid surface in bathtub or shower?: yes Good home lighting?: yes Regular seat belt use?: yes Hospital stays in the last year:: (!) yes How many hospital stays:: 1 Reason: right leg amputation  Cognitive Assessment Difficulty concentrating, remembering, or making decisions? : no Will 6CIT or Mini Cog be Completed: yes What year is it?: 0 points What month is it?: 0 points Give patient an address phrase to remember (5  components): [address] [place], [place] About what time is it?: 0 points Count backwards from 20 to 1: 0 points Say the months of the year in reverse: 0 points Repeat the address phrase from earlier: 0 points 6 CIT Score: 0 points  Advance Directives (For Healthcare) Does Patient Have a Medical Advance Directive?: No Type of Advance Directive: Healthcare Power of Attorney Copy of Healthcare Power of Attorney in Chart?: No - copy requested Would patient like information on creating a medical advance directive?: No - Guardian declined  Reviewed/Updated  Reviewed/Updated: All        Objective:    Today's Vitals   10/06/24 1458  Weight: 135 lb (61.2 kg)  Height: 6' 2 (1.88 m)   Body mass index is 17.33 kg/m.  Current Medications (verified) Outpatient Encounter Medications as of 10/06/2024  Medication Sig   acetaminophen  (TYLENOL ) 325 MG tablet  Take 1-2 tablets (325-650 mg total) by mouth every 4 (four) hours as needed for mild pain. (Patient taking differently: Take 325 mg by mouth daily as needed for mild pain (pain score 1-3) or moderate pain (pain score 4-6).)   apixaban  (ELIQUIS ) 5 MG TABS tablet Take 1 tablet (5 mg total) by mouth 2 (two) times daily. Needs Cardiology appt for refills.  Call office [phone].  Thank you..Take 1 tablet (5 mg total) by mouth 2 (two) times daily. (Patient taking differently: Take 5 mg by mouth 2 (two) times daily.)   aspirin  EC 81 MG tablet Take 1 tablet (81 mg total) by mouth daily. Swallow whole.   Blood Glucose Monitoring Suppl DEVI May substitute to any manufacturer covered by patient's insurance.   carvedilol  (COREG ) 3.125 MG tablet Take 1 tablet (3.125 mg total) by mouth 2 (two) times daily with a meal.   empagliflozin  (JARDIANCE ) 10 MG TABS tablet Take 1 tablet (10 mg total) by mouth daily before breakfast.   Glucose Blood (BLOOD GLUCOSE TEST STRIPS) STRP Use to test glucose up to TID prn. May substitute to any manufacturer covered by  patient's insurance.   Lancet Device MISC Use to test glucose TID prn. May substitute to any manufacturer covered by patient's insurance.   Lancets 30G MISC Use to check blood sugars daily   metFORMIN  (GLUCOPHAGE ) 1000 MG tablet Take 1 tablet (1,000 mg total) by mouth 2 (two) times daily with a meal.   metoprolol  tartrate (LOPRESSOR ) 25 MG tablet Take 0.5 tablets (12.5 mg total) by mouth 2 (two) times daily.   potassium chloride  (KLOR-CON ) 10 MEQ tablet Take 1 tablet (10 mEq total) by mouth 2 (two) times daily.   rosuvastatin  (CRESTOR ) 20 MG tablet Take 1 tablet (20 mg total) by mouth daily.   [DISCONTINUED] rivaroxaban  (XARELTO ) 20 MG TABS tablet Take 1 tablet (20 mg total) by mouth daily.   No facility-administered encounter medications on file as of 10/06/2024.   Hearing/Vision screen Hearing Screening - Comments:: Denies hearing difficulties   Vision Screening - Comments:: Wears rx glasses - up to date with routine eye exams  Immunizations and Health Maintenance Health Maintenance  Topic Date Due   Pneumococcal Vaccine: 50+ Years (1 of 2 - PCV) Never done   Colonoscopy  Never done   Zoster Vaccines- Shingrix (1 of 2) 09/23/2004   OPHTHALMOLOGY EXAM  09/25/2015   FOOT EXAM  02/07/2019   Influenza Vaccine  07/04/2024   COVID-19 Vaccine (1 - 2025-26 season) 10/16/2024 (Originally 08/04/2024)   HEMOGLOBIN A1C  01/18/2025   DTaP/Tdap/Td (2 - Td or Tdap) 02/28/2025   Diabetic kidney evaluation - Urine ACR  03/31/2025   Diabetic kidney evaluation - eGFR measurement  07/30/2025   Medicare Annual Wellness (AWV)  10/06/2025   Hepatitis C Screening  Completed   Meningococcal B Vaccine  Aged Out        Assessment/Plan:  This is a routine wellness examination for [place].  Patient Care Team: Geofm Glade PARAS, MD as PCP - General (Internal Medicine) Waddell Danelle ORN, MD as PCP - Cardiology (Cardiology) Waddell Danelle ORN, MD as PCP - Electrophysiology (Cardiology) Waddell Danelle ORN, MD  (Cardiology) Trixie File, MD (Endocrinology) Alvia Norleen BIRCH, MD (Ophthalmology)  I have personally reviewed and noted the following in the patient's chart:   Medical and social history Use of alcohol, tobacco or illicit drugs  Current medications and supplements including opioid prescriptions. Functional ability and status Nutritional status Physical activity Advanced directives List of other  physicians Hospitalizations, surgeries, and ER visits in previous 12 months Vitals Screenings to include cognitive, depression, and falls Referrals and appointments  No orders of the defined types were placed in this encounter.  In addition, I have reviewed and discussed with patient certain preventive protocols, quality metrics, and best practice recommendations. A written personalized care plan for preventive services as well as general preventive health recommendations were provided to patient.   Verdie CHRISTELLA Saba, CMA   10/07/2024   Return in 1 year (on 10/06/2025).  After Visit Summary: (MyChart) Due to this being a telephonic visit, the after visit summary with patients personalized plan was offered to patient via MyChart   Nurse Notes: Scheduled 6-mth diabetes f/u w/PCP for 10/13/2024.

## 2024-10-12 ENCOUNTER — Encounter: Payer: Self-pay | Admitting: Internal Medicine

## 2024-10-12 NOTE — Patient Instructions (Addendum)
      Blood work was ordered.       Medications changes include :   None    A referral was ordered and someone will call you to schedule an appointment.     Return in about 6 months (around 04/12/2025) for Physical Exam.

## 2024-10-12 NOTE — Progress Notes (Unsigned)
 Subjective:    Patient ID: Karl [name], male    DOB: May 21, 1956, 68 y.o.   MRN: [id number]     HPI Karl [name] is here for follow up of his chronic medical problems.  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  Current Outpatient Medications on File Prior to Visit  Medication Sig Dispense Refill  . acetaminophen  (TYLENOL ) 325 MG tablet Take 1-2 tablets (325-650 mg total) by mouth every 4 (four) hours as needed for mild pain. (Patient taking differently: Take 325 mg by mouth daily as needed for mild pain (pain score 1-3) or moderate pain (pain score 4-6).)    . aspirin  EC 81 MG tablet Take 1 tablet (81 mg total) by mouth daily. Swallow whole. 30 tablet 12  . Blood Glucose Monitoring Suppl DEVI May substitute to any manufacturer covered by patient's insurance. 1 each 0  . empagliflozin  (JARDIANCE ) 10 MG TABS tablet Take 1 tablet (10 mg total) by mouth daily before breakfast. 90 tablet 1  . Glucose Blood (BLOOD GLUCOSE TEST STRIPS) STRP Use to test glucose up to TID prn. May substitute to any manufacturer covered by patient's insurance. 100 strip 5  . Lancet Device MISC Use to test glucose TID prn. May substitute to any manufacturer covered by patient's insurance. 100 each 5  . Lancets 30G MISC Use to check blood sugars daily 100 each 3  . metFORMIN  (GLUCOPHAGE ) 1000 MG tablet Take 1 tablet (1,000 mg total) by mouth 2 (two) times daily with a meal. 180 tablet 0  . potassium chloride  (KLOR-CON ) 10 MEQ tablet Take 1 tablet (10 mEq total) by mouth 2 (two) times daily. 30 tablet 3  . rosuvastatin  (CRESTOR ) 20 MG tablet Take 1 tablet (20 mg total) by mouth daily. 90 tablet 1  . [DISCONTINUED] rivaroxaban  (XARELTO ) 20 MG TABS tablet Take 1 tablet (20 mg total) by mouth daily. 30 tablet 0   No current facility-administered medications on file prior to visit.     Review of Systems      Objective:  There were no vitals filed for this visit. BP Readings from Last 3 Encounters:  10/16/24 124/80  10/06/24 108/71  09/15/24 106/75   Wt Readings from Last 3 Encounters:  10/06/24 135 lb (61.2 kg)  09/01/24 150 lb (68 kg)  07/23/24 150 lb (68 kg)   There is no height or weight on file to calculate BMI.    Physical Exam     Lab Results  Component Value Date   WBC 5.3 10/16/2024   HGB 11.8 (L) 10/16/2024   HCT 36.6 (L) 10/16/2024   PLT 327.0 10/16/2024   GLUCOSE 147 (H) 10/16/2024   CHOL 116 10/16/2024   TRIG 94.0 10/16/2024   HDL 44.10 10/16/2024   LDLDIRECT 135.3 06/22/2008   LDLCALC 53 10/16/2024   ALT 17 10/16/2024   AST 21 10/16/2024   NA 135 10/16/2024   K 4.1 10/16/2024   CL 99 10/16/2024   CREATININE 1.38 10/16/2024   BUN 13 10/16/2024   CO2 27 10/16/2024   TSH 1.71 10/16/2024   PSA 8.99 (H) 10/02/2022   INR 1.3 (H) 07/21/2024   HGBA1C 6.8 (H) 10/16/2024   MICROALBUR 4.7 (H) 03/31/2024     Assessment & Plan:    See Problem List for Assessment and Plan of chronic medical problems.    This encounter was created in error - please disregard.

## 2024-10-13 ENCOUNTER — Encounter: Admitting: Internal Medicine

## 2024-10-13 DIAGNOSIS — N1831 Chronic kidney disease, stage 3a: Secondary | ICD-10-CM

## 2024-10-13 DIAGNOSIS — Z89611 Acquired absence of right leg above knee: Secondary | ICD-10-CM

## 2024-10-13 DIAGNOSIS — I152 Hypertension secondary to endocrine disorders: Secondary | ICD-10-CM

## 2024-10-13 DIAGNOSIS — E1169 Type 2 diabetes mellitus with other specified complication: Secondary | ICD-10-CM

## 2024-10-13 DIAGNOSIS — I2510 Atherosclerotic heart disease of native coronary artery without angina pectoris: Secondary | ICD-10-CM

## 2024-10-13 DIAGNOSIS — I69998 Other sequelae following unspecified cerebrovascular disease: Secondary | ICD-10-CM

## 2024-10-13 DIAGNOSIS — E1151 Type 2 diabetes mellitus with diabetic peripheral angiopathy without gangrene: Secondary | ICD-10-CM

## 2024-10-13 DIAGNOSIS — E559 Vitamin D deficiency, unspecified: Secondary | ICD-10-CM

## 2024-10-13 DIAGNOSIS — I5022 Chronic systolic (congestive) heart failure: Secondary | ICD-10-CM

## 2024-10-13 DIAGNOSIS — D649 Anemia, unspecified: Secondary | ICD-10-CM

## 2024-10-14 NOTE — Progress Notes (Unsigned)
 Patient ID: Karl [name]                 DOB: [birth year]                    MRN: [id number]      HPI: Karl [name] is a 68 y.o. male patient referred to lipid clinic by Dr. Kathrin while hospitalized at [facility]. PMH is significant for HTN, CVA with left-sided weakness (2019), cardiomyopathy, T2DM, CAD, and PAD.  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 LDL during hospitalization was elevated at 97.  Reviewed options for lowering LDL cholesterol, including ezetimibe, PCSK-9 inhibitors, bempedoic acid and inclisiran.  Discussed mechanisms of action, dosing, side effects and potential decreases in LDL cholesterol.  Also reviewed cost information and potential options for patient assistance.  Current Medications: rosuvastatin  20 mg daily Intolerances: atorvastatin (nausea/vomiting), pravastatin  20 mg, pravastatin  40 mg, rosuvastatin  40 mg, Risk Factors: age, HTN, T2DM, hx CVA LDL-C goal: <55 ApoB goal:   Diet:   Exercise:   Family History:   Social History:  Tobacco: Former smoker (2 packs/day for 5 years) Alcohol:  Labs: Lipid Panel     Component Value Date/Time   CHOL 149 07/22/2024 0438   TRIG 74 07/22/2024 0438   HDL 37 (L) 07/22/2024 0438   CHOLHDL 4.0 07/22/2024 0438   VLDL 15 07/22/2024 0438   LDLCALC 97 07/22/2024 0438   LDLCALC 54 07/06/2020 1009   LDLDIRECT 135.3 06/22/2008 0911    Past Medical History:  Diagnosis Date   Acute CVA (cerebrovascular accident) (HCC) 06/14/2018   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 Waddell 15 years 1998   Dyspnea    Hypertension    Stroke [facility]) 2019   denies residual on 06/14/2018   TIA (transient ischemic attack) 12/19/2017   Type II diabetes mellitus (HCC) 07/2014 dx    Current Outpatient Medications on File Prior to Visit  Medication Sig Dispense Refill   acetaminophen  (TYLENOL ) 325 MG tablet Take 1-2 tablets  (325-650 mg total) by mouth every 4 (four) hours as needed for mild pain. (Patient taking differently: Take 325 mg by mouth daily as needed for mild pain (pain score 1-3) or moderate pain (pain score 4-6).)     apixaban  (ELIQUIS ) 5 MG TABS tablet Take 1 tablet (5 mg total) by mouth 2 (two) times daily. Needs Cardiology appt for refills.  Call office [phone].  Thank you..Take 1 tablet (5 mg total) by mouth 2 (two) times daily. (Patient taking differently: Take 5 mg by mouth 2 (two) times daily.) 60 tablet 0   aspirin  EC 81 MG tablet Take 1 tablet (81 mg total) by mouth daily. Swallow whole. 30 tablet 12   Blood Glucose Monitoring Suppl DEVI May substitute to any manufacturer covered by patient's insurance. 1 each 0   carvedilol  (COREG ) 3.125 MG tablet Take 1 tablet (3.125 mg total) by mouth 2 (two) times daily with a meal. 180 tablet 0   empagliflozin  (JARDIANCE ) 10 MG TABS tablet Take 1 tablet (10 mg total) by mouth daily before breakfast. 90 tablet 1   Glucose Blood (BLOOD GLUCOSE TEST STRIPS) STRP Use to test glucose up to TID prn. May substitute to any manufacturer covered by patient's insurance. 100 strip 5   Lancet Device MISC Use to test glucose TID prn. May substitute to any manufacturer  covered by patient's insurance. 100 each 5   Lancets 30G MISC Use to check blood sugars daily 100 each 3   metFORMIN  (GLUCOPHAGE ) 1000 MG tablet Take 1 tablet (1,000 mg total) by mouth 2 (two) times daily with a meal. 180 tablet 0   metoprolol  tartrate (LOPRESSOR ) 25 MG tablet Take 0.5 tablets (12.5 mg total) by mouth 2 (two) times daily.     potassium chloride  (KLOR-CON ) 10 MEQ tablet Take 1 tablet (10 mEq total) by mouth 2 (two) times daily. 30 tablet 3   rosuvastatin  (CRESTOR ) 20 MG tablet Take 1 tablet (20 mg total) by mouth daily. 90 tablet 1   [DISCONTINUED] rivaroxaban  (XARELTO ) 20 MG TABS tablet Take 1 tablet (20 mg total) by mouth daily. 30 tablet 0   No current facility-administered medications  on file prior to visit.    Allergies  Allergen Reactions   Lipitor [Atorvastatin] Nausea And Vomiting    Assessment/Plan:  1. Hyperlipidemia -  No problem-specific Assessment & Plan notes found for this encounter.    Thank you,  BSABRA Amon Rocher, PharmD PGY-1 Pharmacy Resident [facility] 10/14/2024 9:15 PM

## 2024-10-15 ENCOUNTER — Ambulatory Visit: Attending: Student in an Organized Health Care Education/Training Program

## 2024-10-15 ENCOUNTER — Encounter: Payer: Self-pay | Admitting: Internal Medicine

## 2024-10-15 DIAGNOSIS — I69393 Ataxia following cerebral infarction: Secondary | ICD-10-CM | POA: Diagnosis not present

## 2024-10-15 NOTE — Progress Notes (Signed)
 Subjective:    Patient ID: Karl [name], male    DOB: Dec 22, 1955, 68 y.o.   MRN: [id number]     HPI Duong is here for follow up of his chronic medical problems.  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  Will get prosthetic.  In wheelchair at home or uses walker.    He has an eye appointment scheduled.  He is taking his medication as prescribed.  He is currently taking carvedilol  and metoprolol .  Looks like metoprolol  was started during one of his hospitalizations  Medications and allergies reviewed with patient and updated if appropriate.  Current Outpatient Medications on File Prior to Visit  Medication Sig Dispense Refill   acetaminophen  (TYLENOL ) 325 MG tablet Take 1-2 tablets (325-650 mg total) by mouth every 4 (four) hours as needed for mild pain. (Patient taking differently: Take 325 mg by mouth daily as needed for mild pain (pain score 1-3) or moderate pain (pain score 4-6).)     aspirin  EC 81 MG tablet Take 1 tablet (81 mg total) by mouth daily. Swallow whole. 30 tablet 12   Blood Glucose Monitoring Suppl DEVI May substitute to any manufacturer covered by patient's insurance. 1 each 0   carvedilol  (COREG ) 3.125 MG tablet Take 1 tablet (3.125 mg total) by mouth 2 (two) times daily with a meal. 180 tablet 0   empagliflozin  (JARDIANCE ) 10 MG TABS tablet Take 1 tablet (10 mg total) by mouth daily before breakfast. 90 tablet 1   Glucose Blood (BLOOD GLUCOSE TEST STRIPS) STRP Use to test glucose up to TID prn. May substitute to any manufacturer covered by patient's insurance. 100 strip 5   Lancet Device MISC Use to test glucose TID prn. May substitute to any manufacturer covered by patient's insurance. 100 each 5   Lancets 30G MISC Use to check blood sugars daily 100 each 3   metFORMIN  (GLUCOPHAGE ) 1000 MG tablet Take 1 tablet (1,000 mg total) by mouth 2 (two) times daily with a meal.  180 tablet 0   metoprolol  tartrate (LOPRESSOR ) 25 MG tablet Take 0.5 tablets (12.5 mg total) by mouth 2 (two) times daily.     potassium chloride  (KLOR-CON ) 10 MEQ tablet Take 1 tablet (10 mEq total) by mouth 2 (two) times daily. 30 tablet 3   rosuvastatin  (CRESTOR ) 20 MG tablet Take 1 tablet (20 mg total) by mouth daily. 90 tablet 1   [DISCONTINUED] rivaroxaban  (XARELTO ) 20 MG TABS tablet Take 1 tablet (20 mg total) by mouth daily. 30 tablet 0   No current facility-administered medications on file prior to visit.     Review of Systems  Constitutional:  Negative for fever.  Respiratory:  Negative for cough, shortness of breath and wheezing.   Cardiovascular:  Negative for chest pain, palpitations and leg swelling.  Gastrointestinal:        No gerd  Neurological:  Negative for light-headedness and headaches.       Objective:   Vitals:   10/16/24 0959  BP: 124/80  Pulse: 81  Temp: 98.7 F (37.1 C)  SpO2: 95%   BP Readings from Last 3 Encounters:  10/16/24 124/80  10/06/24 108/71  09/15/24 106/75   Wt Readings from Last 3 Encounters:  10/06/24 135 lb (61.2 kg)  09/01/24 150 lb (68 kg)  07/23/24 150 lb (68 kg)   There is no height or weight  on file to calculate BMI.    Physical Exam Constitutional:      General: He is not in acute distress.    Appearance: Normal appearance. He is not ill-appearing.  HENT:     Head: Normocephalic and atraumatic.  Eyes:     Conjunctiva/sclera: Conjunctivae normal.  Cardiovascular:     Rate and Rhythm: Normal rate and regular rhythm.     Heart sounds: Normal heart sounds.  Pulmonary:     Effort: Pulmonary effort is normal. No respiratory distress.     Breath sounds: Normal breath sounds. No wheezing or rales.  Musculoskeletal:     Left lower leg: No edema.  Skin:    General: Skin is warm and dry.     Findings: No rash.  Neurological:     Mental Status: He is alert. Mental status is at baseline.  Psychiatric:        Mood and  Affect: Mood normal.        Lab Results  Component Value Date   WBC 7.9 07/29/2024   HGB 11.2 (L) 07/29/2024   HCT 35.9 (L) 07/29/2024   PLT 387 07/29/2024   GLUCOSE 134 (H) 07/30/2024   CHOL 149 07/22/2024   TRIG 74 07/22/2024   HDL 37 (L) 07/22/2024   LDLDIRECT 135.3 06/22/2008   LDLCALC 97 07/22/2024   ALT 33 07/29/2024   AST 31 07/29/2024   NA 131 (L) 07/30/2024   K 4.1 07/30/2024   CL 98 07/30/2024   CREATININE 0.91 07/30/2024   BUN 25 (H) 07/30/2024   CO2 27 07/30/2024   TSH 1.36 03/31/2024   PSA 8.99 (H) 10/02/2022   INR 1.3 (H) 07/21/2024   HGBA1C 5.9 (H) 07/18/2024   MICROALBUR 4.7 (H) 03/31/2024     Assessment & Plan:    See Problem List for Assessment and Plan of chronic medical problems.

## 2024-10-15 NOTE — Patient Instructions (Addendum)
      Blood work was ordered.       Medications changes include :   stop Carvedilol  and increase metoprolol  to 25 mg ( one full pill) twice daily)     Return in about 6 months (around 04/15/2025) for follow up.

## 2024-10-16 ENCOUNTER — Ambulatory Visit: Admitting: Internal Medicine

## 2024-10-16 VITALS — BP 124/80 | HR 81 | Temp 98.7°F

## 2024-10-16 DIAGNOSIS — D649 Anemia, unspecified: Secondary | ICD-10-CM | POA: Diagnosis not present

## 2024-10-16 DIAGNOSIS — E1151 Type 2 diabetes mellitus with diabetic peripheral angiopathy without gangrene: Secondary | ICD-10-CM

## 2024-10-16 DIAGNOSIS — E785 Hyperlipidemia, unspecified: Secondary | ICD-10-CM | POA: Diagnosis not present

## 2024-10-16 DIAGNOSIS — G459 Transient cerebral ischemic attack, unspecified: Secondary | ICD-10-CM | POA: Diagnosis not present

## 2024-10-16 DIAGNOSIS — I5022 Chronic systolic (congestive) heart failure: Secondary | ICD-10-CM | POA: Diagnosis not present

## 2024-10-16 DIAGNOSIS — N1831 Chronic kidney disease, stage 3a: Secondary | ICD-10-CM | POA: Diagnosis not present

## 2024-10-16 DIAGNOSIS — Z89611 Acquired absence of right leg above knee: Secondary | ICD-10-CM

## 2024-10-16 DIAGNOSIS — I69998 Other sequelae following unspecified cerebrovascular disease: Secondary | ICD-10-CM

## 2024-10-16 DIAGNOSIS — E559 Vitamin D deficiency, unspecified: Secondary | ICD-10-CM

## 2024-10-16 DIAGNOSIS — E1159 Type 2 diabetes mellitus with other circulatory complications: Secondary | ICD-10-CM | POA: Diagnosis not present

## 2024-10-16 DIAGNOSIS — Z7984 Long term (current) use of oral hypoglycemic drugs: Secondary | ICD-10-CM

## 2024-10-16 DIAGNOSIS — I2510 Atherosclerotic heart disease of native coronary artery without angina pectoris: Secondary | ICD-10-CM

## 2024-10-16 DIAGNOSIS — E1169 Type 2 diabetes mellitus with other specified complication: Secondary | ICD-10-CM

## 2024-10-16 DIAGNOSIS — R531 Weakness: Secondary | ICD-10-CM

## 2024-10-16 DIAGNOSIS — I152 Hypertension secondary to endocrine disorders: Secondary | ICD-10-CM

## 2024-10-16 LAB — IBC PANEL
Iron: 32 ug/dL — ABNORMAL LOW (ref 42–165)
Saturation Ratios: 14.1 % — ABNORMAL LOW (ref 20.0–50.0)
TIBC: 226.8 ug/dL — ABNORMAL LOW (ref 250.0–450.0)
Transferrin: 162 mg/dL — ABNORMAL LOW (ref 212.0–360.0)

## 2024-10-16 LAB — LIPID PANEL
Cholesterol: 116 mg/dL (ref 0–200)
HDL: 44.1 mg/dL (ref >39.00)
LDL Cholesterol: 53 mg/dL (ref 0–99)
NonHDL: 72.08
Total CHOL/HDL Ratio: 3
Triglycerides: 94 mg/dL (ref 0.0–149.0)
VLDL: 18.8 mg/dL (ref 0.0–40.0)

## 2024-10-16 LAB — COMPREHENSIVE METABOLIC PANEL WITH GFR
ALT: 17 U/L (ref 0–53)
AST: 21 U/L (ref 0–37)
Albumin: 3.4 g/dL — ABNORMAL LOW (ref 3.5–5.2)
Alkaline Phosphatase: 171 U/L — ABNORMAL HIGH (ref 39–117)
BUN: 13 mg/dL (ref 6–23)
CO2: 27 meq/L (ref 19–32)
Calcium: 9.5 mg/dL (ref 8.4–10.5)
Chloride: 99 meq/L (ref 96–112)
Creatinine, Ser: 1.38 mg/dL (ref 0.40–1.50)
GFR: 51.97 mL/min — ABNORMAL LOW (ref >60.00)
Glucose, Bld: 147 mg/dL — ABNORMAL HIGH (ref 70–99)
Potassium: 4.1 meq/L (ref 3.5–5.1)
Sodium: 135 meq/L (ref 135–145)
Total Bilirubin: 0.5 mg/dL (ref 0.2–1.2)
Total Protein: 7.2 g/dL (ref 6.0–8.3)

## 2024-10-16 LAB — FOLATE: Folate: 10.4 ng/mL (ref >5.9)

## 2024-10-16 LAB — TSH: TSH: 1.71 u[IU]/mL (ref 0.35–5.50)

## 2024-10-16 LAB — CBC
HCT: 36.6 % — ABNORMAL LOW (ref 39.0–52.0)
Hemoglobin: 11.8 g/dL — ABNORMAL LOW (ref 13.0–17.0)
MCHC: 32.3 g/dL (ref 30.0–36.0)
MCV: 76.2 fl — ABNORMAL LOW (ref 78.0–100.0)
Platelets: 327 K/uL (ref 150.0–400.0)
RBC: 4.81 Mil/uL (ref 4.22–5.81)
RDW: 16 % — ABNORMAL HIGH (ref 11.5–15.5)
WBC: 5.3 K/uL (ref 4.0–10.5)

## 2024-10-16 LAB — FERRITIN: Ferritin: 107.1 ng/mL (ref 22.0–322.0)

## 2024-10-16 LAB — VITAMIN B12: Vitamin B-12: 200 pg/mL — ABNORMAL LOW (ref 211–911)

## 2024-10-16 LAB — VITAMIN D 25 HYDROXY (VIT D DEFICIENCY, FRACTURES): VITD: 24.06 ng/mL — ABNORMAL LOW (ref 30.00–100.00)

## 2024-10-16 LAB — HEMOGLOBIN A1C: Hgb A1c MFr Bld: 6.8 % — ABNORMAL HIGH (ref 4.6–6.5)

## 2024-10-16 MED ORDER — METOPROLOL TARTRATE 25 MG PO TABS: 25.0000 mg | ORAL_TABLET | Freq: Two times a day (BID) | ORAL | 2 refills | Status: AC

## 2024-10-16 MED ORDER — APIXABAN 5 MG PO TABS: 5.0000 mg | ORAL_TABLET | Freq: Two times a day (BID) | ORAL | 0 refills | Status: AC

## 2024-10-16 NOTE — Assessment & Plan Note (Signed)
 Chronic Taking vitamin D daily Check vitamin D level

## 2024-10-16 NOTE — Assessment & Plan Note (Signed)
 subacute He denies any symptoms suggestive of GI bleed Check CBC, iron panel, folate, B12

## 2024-10-16 NOTE — Assessment & Plan Note (Addendum)
 Chronic Follow with cardiology Appears euvolemic Stop carvedilol  Increase metoprolol  to 25 mg twice daily Continue Jardiance  10 mg daily

## 2024-10-16 NOTE — Assessment & Plan Note (Addendum)
 Chronic Associated with PAD, CAD, hypertension, hyperlipidemia  Lab Results  Component Value Date   HGBA1C 5.9 (H) 07/18/2024   Sugars controlled Check A1c Continue metformin  1000 mg twice daily, Jardiance  10 mg daily Stressed regular exercise, diabetic diet

## 2024-10-16 NOTE — Assessment & Plan Note (Addendum)
 Chronic No symptoms c/w angina Taking both carvedilol  and metoprolol  for some reason Discontinue carvedilol  Continue metoprolol , but increase to 25 mg twice daily, Eliquis  5 mg twice daily, Crestor  20 mg daily

## 2024-10-16 NOTE — Assessment & Plan Note (Signed)
 Chronic Lab Results  Component Value Date   LDLCALC 97 07/22/2024   Regular exercise and healthy diet encouraged Check lipid panel, CMP Continue Crestor  20 mg daily

## 2024-10-16 NOTE — Assessment & Plan Note (Addendum)
 Chronic BP well controlled Discontinue carvedilol  Increase metoprolol  25 mg twice daily Cmp, cbc

## 2024-10-16 NOTE — Assessment & Plan Note (Signed)
 Chronic Stage 3a-improved recently GFR more than 60 Stressed good BP, sugar control Continue increased water  intake Avoid NSAIDs-he does not take any Continue Jardiance  10 mg daily CBC, CMP, vit d level

## 2024-10-16 NOTE — Assessment & Plan Note (Signed)
 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

## 2024-10-16 NOTE — Assessment & Plan Note (Signed)
 Chronic On Eliquis  5 mg twice daily, Jardiance  10 mg daily, Crestor  20 mg daily Blood pressure controlled Sugars controlled Continue regular exercise

## 2024-10-17 ENCOUNTER — Ambulatory Visit: Payer: Self-pay | Admitting: Internal Medicine

## 2024-10-17 DIAGNOSIS — D649 Anemia, unspecified: Secondary | ICD-10-CM

## 2024-10-17 DIAGNOSIS — E538 Deficiency of other specified B group vitamins: Secondary | ICD-10-CM | POA: Insufficient documentation

## 2024-11-14 ENCOUNTER — Other Ambulatory Visit: Payer: Self-pay | Admitting: Internal Medicine

## 2024-11-26 ENCOUNTER — Other Ambulatory Visit: Payer: Self-pay | Admitting: Internal Medicine

## 2024-12-17 ENCOUNTER — Encounter: Payer: Self-pay | Admitting: Physician Assistant

## 2025-04-16 ENCOUNTER — Ambulatory Visit: Admitting: Internal Medicine

## 2025-10-14 ENCOUNTER — Ambulatory Visit

## 2025-10-14 ENCOUNTER — Encounter: Admitting: Internal Medicine
